# Patient Record
Sex: Male | Born: 1955 | Race: Black or African American | Hispanic: No | Marital: Single | Smoking: Never smoker
Health system: Southern US, Community
[De-identification: ages and names within clinical notes are randomized; demographics above are authoritative.]

## PROBLEM LIST (undated history)

## (undated) DIAGNOSIS — L8990 Pressure ulcer of unspecified site, unspecified stage: Secondary | ICD-10-CM

## (undated) DIAGNOSIS — M069 Rheumatoid arthritis, unspecified: Secondary | ICD-10-CM

## (undated) DIAGNOSIS — G8929 Other chronic pain: Secondary | ICD-10-CM

## (undated) DIAGNOSIS — J449 Chronic obstructive pulmonary disease, unspecified: Secondary | ICD-10-CM

## (undated) DIAGNOSIS — F209 Schizophrenia, unspecified: Secondary | ICD-10-CM

## (undated) DIAGNOSIS — I10 Essential (primary) hypertension: Secondary | ICD-10-CM

## (undated) DIAGNOSIS — I509 Heart failure, unspecified: Secondary | ICD-10-CM

## (undated) DIAGNOSIS — E079 Disorder of thyroid, unspecified: Secondary | ICD-10-CM

## (undated) DIAGNOSIS — N400 Enlarged prostate without lower urinary tract symptoms: Secondary | ICD-10-CM

---

## 2004-03-09 ENCOUNTER — Other Ambulatory Visit: Payer: Self-pay

## 2005-01-25 ENCOUNTER — Other Ambulatory Visit: Payer: Self-pay

## 2005-01-26 ENCOUNTER — Observation Stay: Payer: Self-pay | Admitting: Internal Medicine

## 2005-02-18 ENCOUNTER — Emergency Department: Payer: Self-pay | Admitting: Emergency Medicine

## 2005-02-28 ENCOUNTER — Emergency Department: Payer: Self-pay | Admitting: Emergency Medicine

## 2005-03-05 ENCOUNTER — Emergency Department: Payer: Self-pay | Admitting: Emergency Medicine

## 2005-07-06 ENCOUNTER — Other Ambulatory Visit: Payer: Self-pay

## 2005-07-06 ENCOUNTER — Emergency Department: Payer: Self-pay | Admitting: Emergency Medicine

## 2005-11-07 ENCOUNTER — Inpatient Hospital Stay: Payer: Self-pay | Admitting: Internal Medicine

## 2005-11-07 ENCOUNTER — Other Ambulatory Visit: Payer: Self-pay

## 2006-04-17 ENCOUNTER — Other Ambulatory Visit: Payer: Self-pay

## 2006-04-17 ENCOUNTER — Inpatient Hospital Stay: Payer: Self-pay | Admitting: Internal Medicine

## 2008-01-23 ENCOUNTER — Inpatient Hospital Stay: Payer: Self-pay | Admitting: Internal Medicine

## 2008-01-23 ENCOUNTER — Other Ambulatory Visit: Payer: Self-pay

## 2008-03-25 ENCOUNTER — Emergency Department: Payer: Self-pay | Admitting: Emergency Medicine

## 2008-05-15 ENCOUNTER — Inpatient Hospital Stay: Payer: Self-pay | Admitting: Unknown Physician Specialty

## 2008-05-15 ENCOUNTER — Other Ambulatory Visit: Payer: Self-pay

## 2008-08-18 ENCOUNTER — Emergency Department: Payer: Self-pay | Admitting: Emergency Medicine

## 2008-10-24 ENCOUNTER — Emergency Department: Payer: Self-pay | Admitting: Emergency Medicine

## 2008-11-06 ENCOUNTER — Emergency Department: Payer: Self-pay | Admitting: Emergency Medicine

## 2009-02-22 ENCOUNTER — Inpatient Hospital Stay: Payer: Self-pay | Admitting: Internal Medicine

## 2010-03-12 ENCOUNTER — Emergency Department: Payer: Self-pay | Admitting: Emergency Medicine

## 2010-05-13 ENCOUNTER — Emergency Department: Payer: Self-pay

## 2011-05-15 ENCOUNTER — Emergency Department: Payer: Self-pay | Admitting: Emergency Medicine

## 2011-07-17 ENCOUNTER — Emergency Department: Payer: Self-pay | Admitting: Emergency Medicine

## 2011-09-19 ENCOUNTER — Emergency Department: Payer: Self-pay | Admitting: Emergency Medicine

## 2011-11-04 ENCOUNTER — Emergency Department: Payer: Self-pay | Admitting: Emergency Medicine

## 2012-05-03 ENCOUNTER — Emergency Department: Payer: Self-pay | Admitting: Emergency Medicine

## 2012-05-03 LAB — CBC
HGB: 10.9 g/dL — ABNORMAL LOW (ref 13.0–18.0)
MCH: 26.4 pg (ref 26.0–34.0)
MCHC: 32.8 g/dL (ref 32.0–36.0)
Platelet: 228 10*3/uL (ref 150–440)
RBC: 4.15 10*6/uL — ABNORMAL LOW (ref 4.40–5.90)

## 2012-05-03 LAB — COMPREHENSIVE METABOLIC PANEL
Alkaline Phosphatase: 102 U/L (ref 50–136)
Anion Gap: 9 (ref 7–16)
BUN: 10 mg/dL (ref 7–18)
Chloride: 99 mmol/L (ref 98–107)
Co2: 29 mmol/L (ref 21–32)
Creatinine: 0.71 mg/dL (ref 0.60–1.30)
Osmolality: 272 (ref 275–301)
Sodium: 137 mmol/L (ref 136–145)

## 2012-07-25 ENCOUNTER — Emergency Department: Payer: Self-pay | Admitting: *Deleted

## 2012-07-25 LAB — COMPREHENSIVE METABOLIC PANEL
Albumin: 2.4 g/dL — ABNORMAL LOW (ref 3.4–5.0)
Anion Gap: 6 — ABNORMAL LOW (ref 7–16)
BUN: 8 mg/dL (ref 7–18)
Chloride: 107 mmol/L (ref 98–107)
Co2: 28 mmol/L (ref 21–32)
Creatinine: 0.8 mg/dL (ref 0.60–1.30)
Glucose: 88 mg/dL (ref 65–99)
Osmolality: 279 (ref 275–301)
Potassium: 3.7 mmol/L (ref 3.5–5.1)
Sodium: 141 mmol/L (ref 136–145)
Total Protein: 7.5 g/dL (ref 6.4–8.2)

## 2012-07-25 LAB — DRUG SCREEN, URINE
Amphetamines, Ur Screen: NEGATIVE (ref ?–1000)
Barbiturates, Ur Screen: POSITIVE (ref ?–200)
Cannabinoid 50 Ng, Ur ~~LOC~~: NEGATIVE (ref ?–50)
Cocaine Metabolite,Ur ~~LOC~~: NEGATIVE (ref ?–300)
Methadone, Ur Screen: NEGATIVE (ref ?–300)
Opiate, Ur Screen: NEGATIVE (ref ?–300)
Phencyclidine (PCP) Ur S: NEGATIVE (ref ?–25)
Tricyclic, Ur Screen: NEGATIVE (ref ?–1000)

## 2012-07-25 LAB — ETHANOL: Ethanol %: <0.003 % (ref 0.000–0.080)

## 2012-07-25 LAB — CBC
MCH: 26.6 pg (ref 26.0–34.0)
MCHC: 34 g/dL (ref 32.0–36.0)
MCV: 78 fL — ABNORMAL LOW (ref 80–100)
Platelet: 285 10*3/uL (ref 150–440)
RBC: 3.86 10*6/uL — ABNORMAL LOW (ref 4.40–5.90)
RDW: 16.4 % — ABNORMAL HIGH (ref 11.5–14.5)

## 2012-07-25 LAB — URINALYSIS, COMPLETE
Blood: NEGATIVE
Glucose,UR: NEGATIVE mg/dL (ref 0–75)
Ketone: NEGATIVE
Nitrite: NEGATIVE
Ph: 6 (ref 4.5–8.0)
Protein: 30
RBC,UR: 4 /HPF (ref 0–5)
Specific Gravity: 1.028 (ref 1.003–1.030)
WBC UR: 5 /HPF (ref 0–5)

## 2012-07-25 LAB — TSH: Thyroid Stimulating Horm: 2.86 u[IU]/mL

## 2012-10-11 ENCOUNTER — Emergency Department: Payer: Self-pay | Admitting: Emergency Medicine

## 2012-10-11 LAB — URINALYSIS, COMPLETE
Bacteria: NONE SEEN
Blood: NEGATIVE
Glucose,UR: NEGATIVE mg/dL (ref 0–75)
Leukocyte Esterase: NEGATIVE
Nitrite: NEGATIVE
Ph: 6 (ref 4.5–8.0)
Protein: NEGATIVE
RBC,UR: 2 /HPF (ref 0–5)
Specific Gravity: 1.025 (ref 1.003–1.030)

## 2012-10-11 LAB — CBC
HGB: 11.1 g/dL — ABNORMAL LOW (ref 13.0–18.0)
Platelet: 259 10*3/uL (ref 150–440)
RBC: 4 10*6/uL — ABNORMAL LOW (ref 4.40–5.90)
WBC: 4.8 10*3/uL (ref 3.8–10.6)

## 2012-10-11 LAB — DRUG SCREEN, URINE
Amphetamines, Ur Screen: NEGATIVE (ref ?–1000)
Methadone, Ur Screen: NEGATIVE (ref ?–300)
Opiate, Ur Screen: NEGATIVE (ref ?–300)
Phencyclidine (PCP) Ur S: NEGATIVE (ref ?–25)
Tricyclic, Ur Screen: NEGATIVE (ref ?–1000)

## 2012-10-11 LAB — COMPREHENSIVE METABOLIC PANEL
Alkaline Phosphatase: 97 U/L (ref 50–136)
Anion Gap: 6 — ABNORMAL LOW (ref 7–16)
BUN: 12 mg/dL (ref 7–18)
Co2: 30 mmol/L (ref 21–32)
Creatinine: 0.62 mg/dL (ref 0.60–1.30)
EGFR (Non-African Amer.): >60
Glucose: 95 mg/dL (ref 65–99)
Osmolality: 281 (ref 275–301)
SGOT(AST): 16 U/L (ref 15–37)
SGPT (ALT): 16 U/L (ref 12–78)

## 2012-10-11 LAB — ETHANOL: Ethanol %: <0.003 % (ref 0.000–0.080)

## 2012-10-11 LAB — TSH: Thyroid Stimulating Horm: 4.25 u[IU]/mL

## 2012-10-11 LAB — ACETAMINOPHEN LEVEL: Acetaminophen: <2 ug/mL

## 2012-10-11 LAB — SALICYLATE LEVEL: Salicylates, Serum: <1.7 mg/dL

## 2013-01-04 ENCOUNTER — Other Ambulatory Visit: Payer: Self-pay | Admitting: Family Medicine

## 2013-01-04 LAB — URINALYSIS, COMPLETE
Blood: NEGATIVE
Glucose,UR: NEGATIVE mg/dL (ref 0–75)
Leukocyte Esterase: NEGATIVE
Ph: 5 (ref 4.5–8.0)
RBC,UR: <1 /HPF (ref 0–5)
WBC UR: 1 /HPF (ref 0–5)

## 2013-01-04 LAB — LITHIUM LEVEL: Lithium: 0.65 mmol/L

## 2013-01-05 ENCOUNTER — Other Ambulatory Visit: Payer: Self-pay | Admitting: Family Medicine

## 2013-01-05 LAB — CBC WITH DIFFERENTIAL/PLATELET
Basophil #: 0 10*3/uL (ref 0.0–0.1)
Eosinophil %: 0.1 %
HGB: 11.7 g/dL — ABNORMAL LOW (ref 13.0–18.0)
Lymphocyte #: 1.3 10*3/uL (ref 1.0–3.6)
Lymphocyte %: 11.1 %
MCHC: 33.1 g/dL (ref 32.0–36.0)
MCV: 87 fL (ref 80–100)
Monocyte #: 2 x10 3/mm — ABNORMAL HIGH (ref 0.2–1.0)
Monocyte %: 17.8 %
Neutrophil #: 8.1 10*3/uL — ABNORMAL HIGH (ref 1.4–6.5)
Neutrophil %: 70.7 %
RBC: 4.05 10*6/uL — ABNORMAL LOW (ref 4.40–5.90)

## 2013-01-05 LAB — COMPREHENSIVE METABOLIC PANEL
Albumin: 2.9 g/dL — ABNORMAL LOW (ref 3.4–5.0)
BUN: 15 mg/dL (ref 7–18)
Calcium, Total: 8.5 mg/dL (ref 8.5–10.1)
Chloride: 105 mmol/L (ref 98–107)
Creatinine: 0.75 mg/dL (ref 0.60–1.30)
Potassium: 3.8 mmol/L (ref 3.5–5.1)
SGOT(AST): 20 U/L (ref 15–37)
SGPT (ALT): 9 U/L — ABNORMAL LOW (ref 12–78)

## 2013-01-06 LAB — URINE CULTURE

## 2013-01-19 ENCOUNTER — Other Ambulatory Visit: Payer: Self-pay | Admitting: Family Medicine

## 2013-01-19 LAB — URINALYSIS, COMPLETE
Bacteria: NONE SEEN
Bilirubin,UR: NEGATIVE
Blood: NEGATIVE
Hyaline Cast: 4
Ketone: NEGATIVE
Leukocyte Esterase: NEGATIVE
RBC,UR: <1 /HPF (ref 0–5)
Specific Gravity: 1.008 (ref 1.003–1.030)
Squamous Epithelial: 6
WBC UR: 2 /HPF (ref 0–5)

## 2013-01-19 LAB — CBC WITH DIFFERENTIAL/PLATELET
Basophil #: 0.1 10*3/uL (ref 0.0–0.1)
Basophil %: 0.8 %
Eosinophil #: 0.2 10*3/uL (ref 0.0–0.7)
Eosinophil %: 3.2 %
HCT: 34 % — ABNORMAL LOW (ref 40.0–52.0)
HGB: 11.2 g/dL — ABNORMAL LOW (ref 13.0–18.0)
MCHC: 32.9 g/dL (ref 32.0–36.0)
MCV: 88 fL (ref 80–100)
Monocyte %: 8 %
Neutrophil %: 69.1 %
Platelet: 334 10*3/uL (ref 150–440)
RBC: 3.87 10*6/uL — ABNORMAL LOW (ref 4.40–5.90)
WBC: 6.7 10*3/uL (ref 3.8–10.6)

## 2013-01-21 LAB — URINE CULTURE

## 2015-03-16 NOTE — Consult Note (Signed)
PATIENT NAME:  Jerry [name], Jerry [name] MR#:  [id number] DATE OF BIRTH:  1956-08-01  DATE OF CONSULTATION:  07/26/2012  REFERRING PHYSICIAN:   CONSULTING PHYSICIAN:  [name], MD  IDENTIFYING INFORMATION AND REASON FOR CONSULTATION: This is a 59 year old gentleman who came to the Emergency Room by EMS with a complaint of having pain in his legs. Consult for evaluation of a psychiatric patient.   HISTORY OF PRESENT ILLNESS: Information obtained from the patient, from his current chart and from his old chart. The chart indicates that he came into the Emergency Room last night with a chief complaint of having pain in his legs. He was evaluated by psychiatric nurse and was denying suicidal or homicidal ideation. He was endorsing having some hallucinations and having been off of his medicines but his primary complaints were medical. I saw the patient today and his chief complaint to me was pain in his legs. He told me that for the last several days he has felt that someone is pulling on his leg. He cannot see them. He denies having any auditory or visual hallucinations. He says that he has been off of all of his medicines for about 5 or 6 days and wants to get back on them. His reason for being off his medicines is that his pharmacy has closed which I do not think is actually correct. He cannot give a logical reason why his medicines have not been refilled or his doctor has not been contacted. Patient is not complaining of acute mood symptoms and is not behaving in an agitated manner. He is not refusing treatment and actually gives evidence of having good insight into his current condition and needs.   PAST PSYCHIATRIC HISTORY: Patient has history of schizophrenia or schizoaffective disorder. He was last admitted to our hospital in 2009 under Dr. Reuel Derby service and was treated with antipsychotics and mood stabilizers and stabilized well. Since then he has had one visit to our Emergency Room and was not admitted to  the hospital. Patient tells me that he had recently been following up with a doctor but for some reason that he is not sure of has not been able to go back to see his doctor. He tells me that the people at the place where he lives had taken him to see Dr. [name]. It is not clear why Dr. [name] is not continuing to prescribe this medicine. Patient denies any past history of suicide attempts. We know that in the past he had been followed at [facility] by Dr. [name] and had been stabilized on Depakote and Risperdal in the past.   PAST MEDICAL HISTORY: Patient has multiple medical problems including history of two knee replacements with chronic pain and disability in his legs. Appears to be chronically in need of either a wheelchair or crutches. Has a history of cardiac catheterization, history of prostatic hypertrophy, history of gastric reflux, history of congestive heart failure and hypertension. Chronic obstructive pulmonary disease. Patient has a list of all of his medications on hand which he shows me. He knows the names of all of the medicines. He says he has been off of them for about five days.   SOCIAL HISTORY: Patient lives with a woman named [name]. He describes her as being a "Christian sister". He appears to not have family currently actively involved in his treatment. He says that [name] is his caregiver. He also, however, states that she has not been taking him to any of his scheduled doctor's  appointments or getting any of his medication filled.   CURRENT MEDICATIONS:  1. Coreg 6.25 mg twice a day.  2. Proscar 5 mg per day.  3. Advair Diskus 250/50, 1 puff twice a day.  4. Meloxicam 7.5 mg per day.  5. Olanzapine 10 mg at bedtime.  6. Potassium chloride 10 mEq per day. 7. Flomax 0.4 mg once a day. 8. Demadex 20 mg per day.   ALLERGIES: Methadone, penicillin, propoxyphene, sulfa drugs.   SUBSTANCE ABUSE HISTORY: Patient denies use of alcohol or drugs. It does not appear that this  has been an active part of his problems in the relevant past.   REVIEW OF SYSTEMS: He is complaining of pain in his feet. Complains of feeling like there are people who are pulling on at bedtime feet. Denies hallucinations, otherwise. Denies suicidal or homicidal ideation. Denies any other specific medical issues.   MENTAL STATUS EXAM: Somewhat disheveled, poorly groomed man who looks his stated age or older. Interviewed in the Emergency Room. He is cooperative and pleasant during the interview. Makes good eye contact. Psychomotor activity is a little bit slow. He does not seem to be able to move his legs very well. His speech was quiet but otherwise normal in tone and volume. His affect was a little bit flattened but not bizarrely so. No sign of hostility. No tearfulness. Appropriate reactivity. Thoughts appeared to be grossly lucid and he was able to hold a reasonable conversation. He apparently has tactile hallucinations and at other times has made some hyperreligious comments but does not seem to be driven by obvious delusions. He denies suicidal or homicidal ideation. He is alert and oriented x4. Judgment and insight seem adequate to the current situation. Intelligence is probably average. Short-term and longer term memory grossly intact.  LABORATORY, DIAGNOSTIC AND RADIOLOGICAL DATA: Drug screen positive for barbiturates. TSH normal. Alcohol undetectable. Calcium low at 8.2, albumin low at 2.4. CBC shows a low hematocrit at 30.3, low MCV possible iron deficiency. Urinalysis borderline for possible infection.   ASSESSMENT: This is a 59 year old man who has schizophrenia but is actually presenting to the hospital with a primary complaint of pain in his legs. On the interview I obtained today he was pretty clear about that. Psychiatric issues were not part of his chief complaint. He has been off his medicine for several days for unclear reasons. He claims that the caregiver he stays with has not been  taking him to his medical appointments. We have been trying to get in touch with his caregiver since last night and so far have not found a phone number to reach her. Patient says he does not know of any phone number to directly reach her. Patient does not need psychiatric admission. He would benefit from being back on his medication and having an outpatient position both for his medical and psychiatric problems. It appears right now that he needs to have some social intervention to make sure that he is being safely taken care of in the community.   TREATMENT PLAN: I have put in orders for his current medications as he lists him. Again, he does not need psychiatric hospitalization. If an involuntary commitment paper has been filed I will discontinue it. Patient's case will be presented to the Emergency Room attending. Our staff have already talked to care management about looking into placement.   DIAGNOSIS PRINCIPLE AND PRIMARY:  AXIS I: Schizophrenia, undifferentiated.   SECONDARY DIAGNOSES:  AXIS I: No further.   AXIS II:  No diagnosis.   AXIS III:  1. Hypertension. 2. Chronic pain. 3. Chronic obstructive pulmonary disease.  4. History of prostate enlargement. 5. History of heart failure. 6. Inability to use legs of unclear etiology unless it is just from the chronic pain from his knee replacements.   AXIS IV: Severe from what appears to be poor care and use of resources.   AXIS V: Functioning at time of assessment is 45.   ____________________________ [name], MD jtc:cms D: 07/26/2012 11:57:49 ET T: 07/26/2012 12:25:18 ET JOB#: [id number]  cc: [name], MD, <Dictator> [name] MD ELECTRONICALLY SIGNED 07/26/2012 14:30

## 2015-03-16 NOTE — Consult Note (Signed)
Brief Consult Note: Diagnosis: schizophrenia.   Patient was seen by consultant.   Consult note dictated.   Orders entered.   Comments: Psychiatry: Patient seen. Chart reviewed. Patient has schizophrenia but also multiple other problems. He has tactile  hallucinations but is not agitated, denies AH and denies any suicidal or homicidal ideation. He came to the hospital wanting to get his meds filled. I reordered his meds per his list. He does not need psychiatric admission. Needs his placement and care to me looked into.  Electronic Signatures: [name] (MD)  (Signed 30-Aug-13 11:46)  Authored: Brief Consult Note   Last Updated: 30-Aug-13 11:46 by [name] (MD)

## 2015-03-16 NOTE — Consult Note (Signed)
Brief Consult Note: Diagnosis: schizophrenia.   Patient was seen by consultant.   Consult note dictated.   Comments: Psychiatry: Patient seen at request of Dr Jimmye Norman for questions of competancy. PAtient known to me from prior evaluation last week. Full note done but the main point is that I do not think he is really capable of making a rational decision about his place to live or his rehab treatment.  Electronic Signatures: [name] (MD)  (Signed 05-Sep-13 14:11)  Authored: Brief Consult Note   Last Updated: 05-Sep-13 14:11 by [name] (MD)

## 2015-03-16 NOTE — Consult Note (Signed)
PATIENT NAME:  Jerry [name], Jerry [name] MR#:  [id number] DATE OF BIRTH:  11/06/1956  DATE OF CONSULTATION:  08/01/2012  CONSULTING PHYSICIAN:  [name], MD  IDENTIFYING INFORMATION:  The patient is a 59 year old man with schizophrenia who has been in the Emergency Room for about a week.   REASON FOR CONSULTATION: Consult is for a question of his ability to make competent decisions.   HISTORY OF PRESENT ILLNESS: Information is obtained from the patient and from the chart. The patient is known to me from evaluation I did when he first came into the Emergency Room last week. He is a 59 year old man with a history of schizophrenia who had been residing at what appears to probably be an unlicensed group home in the community. He called 911 to have himself brought to the Emergency Room because of complaints about feeling that "someone is holding my feet down." He did not have suicidal or homicidal ideation. He was not agitated or aggressive. He has been basically cooperative with treatment in the Emergency Room so far. It was my judgment at the time that the patient did not have a need for admission to a Psychiatry Ward. I had recommended that he be discharged home or otherwise discharged from the Emergency Room. It appears that in the interval the patient has been evaluated by Physical Therapy who have concluded that the patient needs a large amount of assistance in transfer and with physical therapy assistance to try and regain any mobility. It also appears that he has been diagnosed with pressure sores which require treatment and monitoring. A recommendation has been made that he go to a rehab facility for further treatment. The patient evidently has been declining that recommendation. When I saw him today, the patient did not complain of any mood symptoms. He did not complain of any active psychotic symptoms. He denied any suicidal or homicidal ideation. He says that generally he was feeling okay. He confirmed that  he was not able to stand up or walk on his own and required assistance to transfer. He told me that so far what he knew is that somebody had "said something about [facility] or something," and all he wanted to do was to go back home. He told me that he did not want to go to a rehab facility. His initial reasoning for this was that he said that he has been in them before and that he was somehow cheated out of money. He then went on to go on a tangential description of reasons why he wants to go back to where he was living before.   PAST PSYCHIATRIC HISTORY: Long history of schizophrenia. He has recently been maintained on olanzapine. He appears to still have some disorganized thinking but not actively responding to internal stimuli. Mood is stable. No acute dangerous behavior. He had questionable compliance with medication or treatment in the community, and its unclear whether that was because of the people at the group home or because of himself and his behavior.   PAST MEDICAL HISTORY: The patient has hypertension, prostate hypertrophy, chronic pain, inability to walk which I do not think is completely explained. 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   SOCIAL HISTORY: The patient apparently had been living in a facility here in [place]. According to the notes from Care Management, the person he was living with is known to have in the past operated unlicensed group homes. I do not see clear documentation that there has been communication with that person. The  patient was not able to really tell me very clearly. It sounds like they probably have not been able to visit him, and he has not spoken to anyone on the phone.   REVIEW OF SYSTEMS: He denies mood symptoms. Denies hallucinations. Denies acute anxiety. Denies major symptoms of pain. Says that he is eating okay. Denies suicidal or homicidal ideation.   MENTAL STATUS EXAM: A 60 year old man, moderate-to-severely overweight. Dressed in a hospital gown. Sitting  up in a chair. Cooperative with the interview. Made good eye contact. Psychomotor activity was appropriate. Speech was normal in tone. Affect was a little bit constricted but not necessarily bizarrely so. Mood was stated as being okay. The patient's thoughts were markedly tangential. He required constant redirection to keep him on the topic of discussing his medical care. His tangents tend to be either about money that he believes he is owed, money that he thinks that he is going to receive from a lawsuit, or some sort of plan he has to rescue his niece from being in a homeless shelter. The patient seems to be confused and borderline delusional about some paperwork that he has at home. He told me that he has paperwork at home that if he can get it organized and put it on a computer it will save his niece. When asked to describe this, he was extremely vague but it sounds like it might have been some junk mail advertisements that he had. He did not appear to be responding to internal stimuli. He denies suicidal or homicidal ideation. On cognitive exam, he is alert and oriented to the place and date and situation. His immediate short-term memory is grossly intact. He appears to probably be of normal baseline intelligence. Unfortunately, despite multiple efforts of mine to keep him on topic, he is very tangential and the things that he gets tangential about do not make any sense. I explained to the patient his medical condition and the rationale for having him go to a rehab facility. I emphasized to him that pressure sores are potentially fatal and that our evidence suggests that he has not been getting very good care recently. The patient was able to repeat to me this basic argument but then went on to immediately say that he needs to go back home so that he can save his niece from being homeless. This whole story makes very little sense, but he is completely fixated on it. He will not admit that getting his own health  taken care of first could be more important than that. He also is very confused about his money. He is unable to explain to me exactly how his money is dispersed, and he has unrealistic ideas about the financing of going to a rehab facility.   ASSESSMENT: This 59 year old man with schizophrenia has been recommended to be in a rehab facility such as [facility] or [facility]. The rationale has been explained to him, namely to get skilled nursing assistance, physical therapy, and treatment for pressure sores. The patient is able to repeat this recommendation, but his thoughts are not able to rationally follow the argument. He has what appears to be a delusion about some kind of help that he is supposed to render to his niece. None of it makes any sense, and he cannot really explain it very well. He is not able to see that this is nonsensical and delusional. Because of this, I do not think he can make a rational judgment about what is  in his own best interest. I would also add that at this point it seems unlikely that there is much chance that that is going to change in the immediate future.   TREATMENT RECOMMENDATIONS: On the specific question of whether he is able to make an informed decision about his own placement, I would say that currently my examination is that he is not able to make such a rational decision. I recommend that Care Management and the Emergency Room follow through with treatment for him on that basis.   DIAGNOSIS, PRINCIPAL AND PRIMARY:  AXIS I: Schizophrenia, undifferentiated.   AXIS II: Deferred.   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 2. Hypertension. 3. Pressure sores.   AXIS IV: Severe from being in the Emergency Room for so long, having no clear place to live.   AXIS V: Functioning at time of evaluation 30.  ____________________________ [name], MD jtc:cbb D: 08/01/2012 14:23:09 ET T: 08/01/2012 18:04:01 ET JOB#: [id number] [name] MD ELECTRONICALLY SIGNED 08/01/2012 23:08

## 2017-06-21 ENCOUNTER — Encounter: Payer: Self-pay | Admitting: Emergency Medicine

## 2017-06-21 ENCOUNTER — Emergency Department: Payer: Medicaid Other

## 2017-06-21 ENCOUNTER — Inpatient Hospital Stay
Admission: EM | Admit: 2017-06-21 | Discharge: 2017-07-02 | DRG: 870 | Disposition: A | Discharge status: Skilled Nursing Facility | Payer: Medicaid Other | Attending: Internal Medicine | Admitting: Internal Medicine

## 2017-06-21 DIAGNOSIS — M069 Rheumatoid arthritis, unspecified: Secondary | ICD-10-CM | POA: Diagnosis present

## 2017-06-21 DIAGNOSIS — G40909 Epilepsy, unspecified, not intractable, without status epilepticus: Secondary | ICD-10-CM | POA: Diagnosis present

## 2017-06-21 DIAGNOSIS — R6520 Severe sepsis without septic shock: Secondary | ICD-10-CM | POA: Diagnosis not present

## 2017-06-21 DIAGNOSIS — Z6841 Body Mass Index (BMI) 40.0 and over, adult: Secondary | ICD-10-CM | POA: Diagnosis not present

## 2017-06-21 DIAGNOSIS — I5030 Unspecified diastolic (congestive) heart failure: Secondary | ICD-10-CM | POA: Diagnosis present

## 2017-06-21 DIAGNOSIS — Z9911 Dependence on respirator [ventilator] status: Secondary | ICD-10-CM | POA: Diagnosis not present

## 2017-06-21 DIAGNOSIS — R001 Bradycardia, unspecified: Secondary | ICD-10-CM

## 2017-06-21 DIAGNOSIS — R400 Somnolence: Secondary | ICD-10-CM | POA: Diagnosis not present

## 2017-06-21 DIAGNOSIS — R680 Hypothermia, not associated with low environmental temperature: Secondary | ICD-10-CM | POA: Diagnosis not present

## 2017-06-21 DIAGNOSIS — R509 Fever, unspecified: Secondary | ICD-10-CM | POA: Diagnosis not present

## 2017-06-21 DIAGNOSIS — N170 Acute kidney failure with tubular necrosis: Secondary | ICD-10-CM | POA: Diagnosis present

## 2017-06-21 DIAGNOSIS — I469 Cardiac arrest, cause unspecified: Secondary | ICD-10-CM | POA: Diagnosis not present

## 2017-06-21 DIAGNOSIS — J9602 Acute respiratory failure with hypercapnia: Secondary | ICD-10-CM

## 2017-06-21 DIAGNOSIS — J9621 Acute and chronic respiratory failure with hypoxia: Secondary | ICD-10-CM | POA: Diagnosis present

## 2017-06-21 DIAGNOSIS — L89311 Pressure ulcer of right buttock, stage 1: Secondary | ICD-10-CM | POA: Diagnosis present

## 2017-06-21 DIAGNOSIS — I110 Hypertensive heart disease with heart failure: Secondary | ICD-10-CM | POA: Diagnosis present

## 2017-06-21 DIAGNOSIS — Z4659 Encounter for fitting and adjustment of other gastrointestinal appliance and device: Secondary | ICD-10-CM

## 2017-06-21 DIAGNOSIS — J9600 Acute respiratory failure, unspecified whether with hypoxia or hypercapnia: Secondary | ICD-10-CM

## 2017-06-21 DIAGNOSIS — J9622 Acute and chronic respiratory failure with hypercapnia: Secondary | ICD-10-CM | POA: Diagnosis present

## 2017-06-21 DIAGNOSIS — A419 Sepsis, unspecified organism: Principal | ICD-10-CM

## 2017-06-21 DIAGNOSIS — R1310 Dysphagia, unspecified: Secondary | ICD-10-CM | POA: Diagnosis not present

## 2017-06-21 DIAGNOSIS — Z79899 Other long term (current) drug therapy: Secondary | ICD-10-CM

## 2017-06-21 DIAGNOSIS — D6959 Other secondary thrombocytopenia: Secondary | ICD-10-CM | POA: Diagnosis not present

## 2017-06-21 DIAGNOSIS — N400 Enlarged prostate without lower urinary tract symptoms: Secondary | ICD-10-CM | POA: Diagnosis present

## 2017-06-21 DIAGNOSIS — J440 Chronic obstructive pulmonary disease with acute lower respiratory infection: Secondary | ICD-10-CM | POA: Diagnosis present

## 2017-06-21 DIAGNOSIS — D72819 Decreased white blood cell count, unspecified: Secondary | ICD-10-CM | POA: Diagnosis not present

## 2017-06-21 DIAGNOSIS — G9341 Metabolic encephalopathy: Secondary | ICD-10-CM | POA: Diagnosis present

## 2017-06-21 DIAGNOSIS — T68XXXA Hypothermia, initial encounter: Secondary | ICD-10-CM

## 2017-06-21 DIAGNOSIS — Z7401 Bed confinement status: Secondary | ICD-10-CM | POA: Diagnosis not present

## 2017-06-21 DIAGNOSIS — R609 Edema, unspecified: Secondary | ICD-10-CM

## 2017-06-21 DIAGNOSIS — Z452 Encounter for adjustment and management of vascular access device: Secondary | ICD-10-CM

## 2017-06-21 DIAGNOSIS — R918 Other nonspecific abnormal finding of lung field: Secondary | ICD-10-CM | POA: Diagnosis not present

## 2017-06-21 DIAGNOSIS — R5383 Other fatigue: Secondary | ICD-10-CM | POA: Diagnosis not present

## 2017-06-21 DIAGNOSIS — I10 Essential (primary) hypertension: Secondary | ICD-10-CM

## 2017-06-21 DIAGNOSIS — Z885 Allergy status to narcotic agent status: Secondary | ICD-10-CM | POA: Diagnosis not present

## 2017-06-21 DIAGNOSIS — Y95 Nosocomial condition: Secondary | ICD-10-CM | POA: Diagnosis present

## 2017-06-21 DIAGNOSIS — E039 Hypothyroidism, unspecified: Secondary | ICD-10-CM | POA: Diagnosis present

## 2017-06-21 DIAGNOSIS — L8990 Pressure ulcer of unspecified site, unspecified stage: Secondary | ICD-10-CM | POA: Insufficient documentation

## 2017-06-21 DIAGNOSIS — J9601 Acute respiratory failure with hypoxia: Secondary | ICD-10-CM

## 2017-06-21 DIAGNOSIS — G9340 Encephalopathy, unspecified: Secondary | ICD-10-CM | POA: Diagnosis not present

## 2017-06-21 DIAGNOSIS — E873 Alkalosis: Secondary | ICD-10-CM | POA: Diagnosis present

## 2017-06-21 DIAGNOSIS — Z880 Allergy status to penicillin: Secondary | ICD-10-CM | POA: Diagnosis not present

## 2017-06-21 DIAGNOSIS — G894 Chronic pain syndrome: Secondary | ICD-10-CM | POA: Diagnosis present

## 2017-06-21 DIAGNOSIS — J189 Pneumonia, unspecified organism: Secondary | ICD-10-CM

## 2017-06-21 DIAGNOSIS — Z792 Long term (current) use of antibiotics: Secondary | ICD-10-CM | POA: Diagnosis not present

## 2017-06-21 DIAGNOSIS — R6521 Severe sepsis with septic shock: Secondary | ICD-10-CM | POA: Diagnosis not present

## 2017-06-21 DIAGNOSIS — E870 Hyperosmolality and hypernatremia: Secondary | ICD-10-CM | POA: Diagnosis present

## 2017-06-21 DIAGNOSIS — D696 Thrombocytopenia, unspecified: Secondary | ICD-10-CM

## 2017-06-21 DIAGNOSIS — Z978 Presence of other specified devices: Secondary | ICD-10-CM | POA: Diagnosis not present

## 2017-06-21 DIAGNOSIS — E662 Morbid (severe) obesity with alveolar hypoventilation: Secondary | ICD-10-CM | POA: Diagnosis present

## 2017-06-21 DIAGNOSIS — I4891 Unspecified atrial fibrillation: Secondary | ICD-10-CM | POA: Diagnosis present

## 2017-06-21 DIAGNOSIS — N390 Urinary tract infection, site not specified: Secondary | ICD-10-CM

## 2017-06-21 DIAGNOSIS — H1130 Conjunctival hemorrhage, unspecified eye: Secondary | ICD-10-CM | POA: Diagnosis present

## 2017-06-21 DIAGNOSIS — K5900 Constipation, unspecified: Secondary | ICD-10-CM

## 2017-06-21 DIAGNOSIS — F209 Schizophrenia, unspecified: Secondary | ICD-10-CM | POA: Diagnosis present

## 2017-06-21 DIAGNOSIS — R0600 Dyspnea, unspecified: Secondary | ICD-10-CM

## 2017-06-21 DIAGNOSIS — I509 Heart failure, unspecified: Secondary | ICD-10-CM

## 2017-06-21 DIAGNOSIS — R4182 Altered mental status, unspecified: Secondary | ICD-10-CM

## 2017-06-21 DIAGNOSIS — R5381 Other malaise: Secondary | ICD-10-CM | POA: Diagnosis not present

## 2017-06-21 DIAGNOSIS — L89321 Pressure ulcer of left buttock, stage 1: Secondary | ICD-10-CM | POA: Diagnosis present

## 2017-06-21 DIAGNOSIS — Z87440 Personal history of urinary (tract) infections: Secondary | ICD-10-CM

## 2017-06-21 DIAGNOSIS — L89151 Pressure ulcer of sacral region, stage 1: Secondary | ICD-10-CM | POA: Diagnosis present

## 2017-06-21 DIAGNOSIS — J9690 Respiratory failure, unspecified, unspecified whether with hypoxia or hypercapnia: Secondary | ICD-10-CM

## 2017-06-21 DIAGNOSIS — Z882 Allergy status to sulfonamides status: Secondary | ICD-10-CM

## 2017-06-21 HISTORY — DX: Other chronic pain: G89.29

## 2017-06-21 HISTORY — DX: Pressure ulcer of unspecified site, unspecified stage: L89.90

## 2017-06-21 HISTORY — DX: Rheumatoid arthritis, unspecified: M06.9

## 2017-06-21 HISTORY — DX: Chronic obstructive pulmonary disease, unspecified: J44.9

## 2017-06-21 HISTORY — DX: Benign prostatic hyperplasia without lower urinary tract symptoms: N40.0

## 2017-06-21 HISTORY — DX: Disorder of thyroid, unspecified: E07.9

## 2017-06-21 HISTORY — DX: Essential (primary) hypertension: I10

## 2017-06-21 HISTORY — DX: Heart failure, unspecified: I50.9

## 2017-06-21 HISTORY — DX: Schizophrenia, unspecified: F20.9

## 2017-06-21 HISTORY — DX: Morbid (severe) obesity due to excess calories: E66.01

## 2017-06-21 LAB — CBC WITH DIFFERENTIAL/PLATELET
BASOS ABS: 0 10*3/uL (ref 0–0.1)
BASOS PCT: 0 %
Basophils Absolute: 0 10*3/uL (ref 0–0.1)
Basophils Relative: 1 %
EOS ABS: 0 10*3/uL (ref 0–0.7)
EOS ABS: 0 10*3/uL (ref 0–0.7)
Eosinophils Relative: 2 %
Eosinophils Relative: 1 %
HCT: 36.7 % — ABNORMAL LOW (ref 40.0–52.0)
HEMATOCRIT: 33.1 % — AB (ref 40.0–52.0)
HEMOGLOBIN: 11.1 g/dL — AB (ref 13.0–18.0)
Hemoglobin: 12.3 g/dL — ABNORMAL LOW (ref 13.0–18.0)
LYMPHS ABS: 0.6 10*3/uL — AB (ref 1.0–3.6)
LYMPHS PCT: 19 %
Lymphocytes Relative: 21 %
Lymphs Abs: 0.6 10*3/uL — ABNORMAL LOW (ref 1.0–3.6)
MCH: 30 pg (ref 26.0–34.0)
MCH: 29.7 pg (ref 26.0–34.0)
MCHC: 33.6 g/dL (ref 32.0–36.0)
MCHC: 33.5 g/dL (ref 32.0–36.0)
MCV: 89.3 fL (ref 80.0–100.0)
MCV: 88.8 fL (ref 80.0–100.0)
MONO ABS: 0.2 10*3/uL (ref 0.2–1.0)
Monocytes Absolute: 0.2 10*3/uL (ref 0.2–1.0)
Monocytes Relative: 6 %
Monocytes Relative: 7 %
NEUTROS ABS: 2.1 10*3/uL (ref 1.4–6.5)
NEUTROS PCT: 72 %
Neutro Abs: 2 10*3/uL (ref 1.4–6.5)
Neutrophils Relative %: 73 %
PLATELETS: 22 10*3/uL — AB (ref 150–440)
Platelets: 19 10*3/uL — CL (ref 150–440)
RBC: 4.11 MIL/uL — AB (ref 4.40–5.90)
RBC: 3.73 MIL/uL — ABNORMAL LOW (ref 4.40–5.90)
RDW: 17.3 % — ABNORMAL HIGH (ref 11.5–14.5)
RDW: 16.8 % — AB (ref 11.5–14.5)
WBC: 2.8 10*3/uL — AB (ref 3.8–10.6)
WBC: 2.9 10*3/uL — ABNORMAL LOW (ref 3.8–10.6)

## 2017-06-21 LAB — BLOOD GAS, ARTERIAL
ACID-BASE EXCESS: 10.6 mmol/L — AB (ref 0.0–2.0)
BICARBONATE: 35.7 mmol/L — AB (ref 20.0–28.0)
FIO2: 50
LHR: 18 {breaths}/min
O2 SAT: 95.8 %
PATIENT TEMPERATURE: 37
PCO2 ART: 49 mmHg — AB (ref 32.0–48.0)
PEEP/CPAP: 5 cmH2O
PO2 ART: 75 mmHg — AB (ref 83.0–108.0)
VT: 500 mL
pH, Arterial: 7.47 — ABNORMAL HIGH (ref 7.350–7.450)

## 2017-06-21 LAB — VALPROIC ACID LEVEL: Valproic Acid Lvl: 39 ug/mL — ABNORMAL LOW (ref 50.0–100.0)

## 2017-06-21 LAB — URINE DRUG SCREEN, QUALITATIVE (ARMC ONLY)
Amphetamines, Ur Screen: NOT DETECTED
Barbiturates, Ur Screen: NOT DETECTED
Benzodiazepine, Ur Scrn: NOT DETECTED
Cannabinoid 50 Ng, Ur ~~LOC~~: NOT DETECTED
Cocaine Metabolite,Ur ~~LOC~~: NOT DETECTED
MDMA (Ecstasy)Ur Screen: NOT DETECTED
Methadone Scn, Ur: NOT DETECTED
Opiate, Ur Screen: NOT DETECTED
Phencyclidine (PCP) Ur S: NOT DETECTED
Tricyclic, Ur Screen: POSITIVE — AB

## 2017-06-21 LAB — LACTIC ACID, PLASMA: Lactic Acid, Venous: 1.5 mmol/L (ref 0.5–1.9)

## 2017-06-21 LAB — URINALYSIS, COMPLETE (UACMP) WITH MICROSCOPIC
Bacteria, UA: NONE SEEN
Bilirubin Urine: NEGATIVE
Glucose, UA: NEGATIVE mg/dL
Ketones, ur: 5 mg/dL — AB
NITRITE: NEGATIVE
PH: 5 (ref 5.0–8.0)
Protein, ur: 30 mg/dL — AB
SPECIFIC GRAVITY, URINE: 1.025 (ref 1.005–1.030)

## 2017-06-21 LAB — COMPREHENSIVE METABOLIC PANEL
ALK PHOS: 92 U/L (ref 38–126)
ALT: 39 U/L (ref 17–63)
ANION GAP: 7 (ref 5–15)
AST: 38 U/L (ref 15–41)
Albumin: 3 g/dL — ABNORMAL LOW (ref 3.5–5.0)
BUN: 16 mg/dL (ref 6–20)
CALCIUM: 9 mg/dL (ref 8.9–10.3)
CHLORIDE: 102 mmol/L (ref 101–111)
CO2: 35 mmol/L — AB (ref 22–32)
Creatinine, Ser: 0.53 mg/dL — ABNORMAL LOW (ref 0.61–1.24)
GFR calc non Af Amer: >60 mL/min (ref >60)
Glucose, Bld: 119 mg/dL — ABNORMAL HIGH (ref 65–99)
Potassium: 3.9 mmol/L (ref 3.5–5.1)
SODIUM: 144 mmol/L (ref 135–145)
Total Bilirubin: 0.5 mg/dL (ref 0.3–1.2)
Total Protein: 7.1 g/dL (ref 6.5–8.1)

## 2017-06-21 LAB — GLUCOSE, CAPILLARY
GLUCOSE-CAPILLARY: 113 mg/dL — AB (ref 65–99)
GLUCOSE-CAPILLARY: 142 mg/dL — AB (ref 65–99)

## 2017-06-21 LAB — ETHANOL

## 2017-06-21 LAB — PROTIME-INR
INR: 1.1
PROTHROMBIN TIME: 14.2 s (ref 11.4–15.2)

## 2017-06-21 LAB — TROPONIN I
Troponin I: <0.03 ng/mL (ref <0.03)
Troponin I: <0.03 ng/mL (ref <0.03)

## 2017-06-21 LAB — MRSA PCR SCREENING: MRSA by PCR: POSITIVE — AB

## 2017-06-21 LAB — SALICYLATE LEVEL

## 2017-06-21 LAB — PROCALCITONIN

## 2017-06-21 LAB — ACETAMINOPHEN LEVEL

## 2017-06-21 LAB — TSH: TSH: 5.336 u[IU]/mL — AB (ref 0.350–4.500)

## 2017-06-21 MED ORDER — SODIUM CHLORIDE 0.9 % IV BOLUS (SEPSIS)
500.0000 mL | Freq: Once | INTRAVENOUS | Status: AC
  Administered 2017-06-21: 500 mL via INTRAVENOUS

## 2017-06-21 MED ORDER — BISACODYL 10 MG RE SUPP
10.0000 mg | Freq: Every day | RECTAL | Status: DC | PRN
  Administered 2017-06-26: 10 mg via RECTAL
  Filled 2017-06-21: qty 1

## 2017-06-21 MED ORDER — FLUPHENAZINE HCL 5 MG PO TABS
12.5000 mg | ORAL_TABLET | Freq: Every day | ORAL | Status: DC
  Administered 2017-06-22 – 2017-06-24 (×3): 12.5 mg via ORAL
  Filled 2017-06-21 (×5): qty 3

## 2017-06-21 MED ORDER — SODIUM CHLORIDE 0.9 % IV BOLUS (SEPSIS)
1000.0000 mL | Freq: Once | INTRAVENOUS | Status: AC
  Administered 2017-06-21: 1000 mL via INTRAVENOUS

## 2017-06-21 MED ORDER — DOCUSATE SODIUM 50 MG/5ML PO LIQD
100.0000 mg | Freq: Two times a day (BID) | ORAL | Status: DC | PRN
  Administered 2017-06-25: 100 mg
  Filled 2017-06-21: qty 10

## 2017-06-21 MED ORDER — DEXTROSE 5 % IV SOLN
500.0000 mg | INTRAVENOUS | Status: DC
  Administered 2017-06-22 – 2017-06-24 (×3): 500 mg via INTRAVENOUS
  Filled 2017-06-21 (×4): qty 500

## 2017-06-21 MED ORDER — LEVOTHYROXINE SODIUM 100 MCG IV SOLR
75.0000 ug | Freq: Every day | INTRAVENOUS | Status: DC
  Administered 2017-06-22 – 2017-06-24 (×3): 75 ug via INTRAVENOUS
  Filled 2017-06-21 (×4): qty 5

## 2017-06-21 MED ORDER — CHLORHEXIDINE GLUCONATE 0.12% ORAL RINSE (MEDLINE KIT)
15.0000 mL | Freq: Two times a day (BID) | OROMUCOSAL | Status: DC
  Administered 2017-06-21 – 2017-06-27 (×13): 15 mL via OROMUCOSAL

## 2017-06-21 MED ORDER — NALOXONE HCL 2 MG/2ML IJ SOSY
PREFILLED_SYRINGE | INTRAMUSCULAR | Status: AC | PRN
  Administered 2017-06-21 (×2): 2 mg via INTRAVENOUS

## 2017-06-21 MED ORDER — SODIUM CHLORIDE 0.9 % IV SOLN
INTRAVENOUS | Status: AC | PRN
  Administered 2017-06-21: 1000 mL via INTRAVENOUS

## 2017-06-21 MED ORDER — ALBUTEROL SULFATE (2.5 MG/3ML) 0.083% IN NEBU
2.5000 mg | INHALATION_SOLUTION | Freq: Four times a day (QID) | RESPIRATORY_TRACT | Status: DC
  Administered 2017-06-21 – 2017-06-29 (×31): 2.5 mg via RESPIRATORY_TRACT
  Filled 2017-06-21 (×30): qty 3

## 2017-06-21 MED ORDER — CHLORHEXIDINE GLUCONATE CLOTH 2 % EX PADS
6.0000 | MEDICATED_PAD | Freq: Every day | CUTANEOUS | Status: AC
  Administered 2017-06-22 – 2017-06-26 (×5): 6 via TOPICAL

## 2017-06-21 MED ORDER — LEVOFLOXACIN IN D5W 750 MG/150ML IV SOLN
750.0000 mg | Freq: Once | INTRAVENOUS | Status: AC
  Administered 2017-06-21: 750 mg via INTRAVENOUS
  Filled 2017-06-21: qty 150

## 2017-06-21 MED ORDER — TORSEMIDE 20 MG PO TABS: 10.0000 mg | ORAL_TABLET | Freq: Every day | ORAL | Status: DC

## 2017-06-21 MED ORDER — ROCURONIUM BROMIDE 50 MG/5ML IV SOLN
INTRAVENOUS | Status: AC | PRN
  Administered 2017-06-21: 100 mg via INTRAVENOUS

## 2017-06-21 MED ORDER — LEVOTHYROXINE SODIUM 150 MCG PO TABS: 150.0000 ug | ORAL_TABLET | Freq: Every day | ORAL | Status: DC

## 2017-06-21 MED ORDER — DEXTROSE 5 % IV SOLN
2.0000 g | Freq: Three times a day (TID) | INTRAVENOUS | Status: DC
  Administered 2017-06-21 – 2017-06-22 (×3): 2 g via INTRAVENOUS
  Filled 2017-06-21 (×6): qty 2

## 2017-06-21 MED ORDER — LORAZEPAM 2 MG/ML IJ SOLN
INTRAMUSCULAR | Status: AC
  Administered 2017-06-21: 2 mg via INTRAVENOUS
  Filled 2017-06-21: qty 1

## 2017-06-21 MED ORDER — POTASSIUM CHLORIDE IN NACL 20-0.9 MEQ/L-% IV SOLN
INTRAVENOUS | Status: DC
  Filled 2017-06-21 (×2): qty 1000

## 2017-06-21 MED ORDER — LORAZEPAM 2 MG/ML IJ SOLN: 1.0000 mg | INTRAMUSCULAR | Status: DC | PRN

## 2017-06-21 MED ORDER — DEXTROSE 5 % IV SOLN
0.0000 ug/min | INTRAVENOUS | Status: DC
  Administered 2017-06-21: 2 ug/min via INTRAVENOUS
  Filled 2017-06-21 (×4): qty 4

## 2017-06-21 MED ORDER — PANTOPRAZOLE SODIUM 40 MG IV SOLR
40.0000 mg | INTRAVENOUS | Status: DC
Start: 2017-06-21 — End: 2017-06-22
  Administered 2017-06-21: 40 mg via INTRAVENOUS
  Filled 2017-06-21: qty 40

## 2017-06-21 MED ORDER — ALBUTEROL SULFATE (2.5 MG/3ML) 0.083% IN NEBU
INHALATION_SOLUTION | RESPIRATORY_TRACT | Status: AC
  Filled 2017-06-21: qty 3

## 2017-06-21 MED ORDER — FLUPHENAZINE HCL 2.5 MG PO TABS: 12.5000 mg | ORAL_TABLET | Freq: Two times a day (BID) | ORAL | Status: DC

## 2017-06-21 MED ORDER — DIVALPROEX SODIUM 125 MG PO CSDR
750.0000 mg | DELAYED_RELEASE_CAPSULE | Freq: Three times a day (TID) | ORAL | Status: DC
  Filled 2017-06-21 (×2): qty 6

## 2017-06-21 MED ORDER — TAMSULOSIN HCL 0.4 MG PO CAPS: 0.4000 mg | ORAL_CAPSULE | Freq: Every day | ORAL | Status: DC

## 2017-06-21 MED ORDER — FENTANYL CITRATE (PF) 100 MCG/2ML IJ SOLN
100.0000 ug | INTRAMUSCULAR | Status: DC | PRN
  Filled 2017-06-21 (×2): qty 2

## 2017-06-21 MED ORDER — VANCOMYCIN HCL 10 G IV SOLR
1250.0000 mg | Freq: Three times a day (TID) | INTRAVENOUS | Status: DC
  Administered 2017-06-21 – 2017-06-22 (×4): 1250 mg via INTRAVENOUS
  Filled 2017-06-21 (×7): qty 1250

## 2017-06-21 MED ORDER — ACETAMINOPHEN 325 MG PO TABS: 650.0000 mg | ORAL_TABLET | Freq: Four times a day (QID) | ORAL | Status: DC | PRN

## 2017-06-21 MED ORDER — ACETAMINOPHEN 650 MG RE SUPP: 650.0000 mg | Freq: Four times a day (QID) | RECTAL | Status: DC | PRN

## 2017-06-21 MED ORDER — ETOMIDATE 2 MG/ML IV SOLN
INTRAVENOUS | Status: AC | PRN
  Administered 2017-06-21: 20 mg via INTRAVENOUS

## 2017-06-21 MED ORDER — MUPIROCIN 2 % EX OINT
1.0000 "application " | TOPICAL_OINTMENT | Freq: Two times a day (BID) | CUTANEOUS | Status: AC
  Administered 2017-06-21 – 2017-06-26 (×10): 1 via NASAL
  Filled 2017-06-21 (×2): qty 22

## 2017-06-21 MED ORDER — ONDANSETRON HCL 4 MG/2ML IJ SOLN: 4.0000 mg | Freq: Four times a day (QID) | INTRAMUSCULAR | Status: DC | PRN

## 2017-06-21 MED ORDER — ATROPINE SULFATE 1 MG/ML IJ SOLN
INTRAMUSCULAR | Status: AC | PRN
  Administered 2017-06-21: 1 mg via INTRAVENOUS

## 2017-06-21 MED ORDER — DEXTROSE 5 % IV SOLN
2.0000 g | Freq: Once | INTRAVENOUS | Status: AC
  Administered 2017-06-21: 2 g via INTRAVENOUS
  Filled 2017-06-21 (×2): qty 2

## 2017-06-21 MED ORDER — DEXTROSE 5 % IV SOLN
750.0000 mg | Freq: Three times a day (TID) | INTRAVENOUS | Status: DC
  Administered 2017-06-21 – 2017-06-25 (×11): 750 mg via INTRAVENOUS
  Filled 2017-06-21 (×14): qty 7.5

## 2017-06-21 MED ORDER — LORAZEPAM 2 MG/ML IJ SOLN
2.0000 mg | Freq: Once | INTRAMUSCULAR | Status: AC
  Administered 2017-06-21: 2 mg via INTRAVENOUS

## 2017-06-21 MED ORDER — ORAL CARE MOUTH RINSE
15.0000 mL | OROMUCOSAL | Status: DC
  Administered 2017-06-21 – 2017-06-28 (×64): 15 mL via OROMUCOSAL

## 2017-06-21 MED ORDER — FLUPHENAZINE HCL 5 MG PO TABS
15.0000 mg | ORAL_TABLET | Freq: Every day | ORAL | Status: DC
  Administered 2017-06-22 – 2017-06-24 (×4): 15 mg via ORAL
  Filled 2017-06-21 (×5): qty 3

## 2017-06-21 MED ORDER — VANCOMYCIN HCL IN DEXTROSE 1-5 GM/200ML-% IV SOLN
1000.0000 mg | Freq: Once | INTRAVENOUS | Status: AC
  Administered 2017-06-21: 1000 mg via INTRAVENOUS
  Filled 2017-06-21: qty 200

## 2017-06-21 MED ORDER — MIDAZOLAM HCL 2 MG/2ML IJ SOLN
2.0000 mg | INTRAMUSCULAR | Status: DC | PRN
  Administered 2017-06-21 – 2017-06-26 (×14): 2 mg via INTRAVENOUS
  Filled 2017-06-21 (×18): qty 2

## 2017-06-21 MED ORDER — POLYETHYLENE GLYCOL 3350 17 G PO PACK
17.0000 g | PACK | Freq: Every day | ORAL | Status: DC
  Administered 2017-06-21 – 2017-06-25 (×5): 17 g via NASOGASTRIC
  Filled 2017-06-21 (×5): qty 1

## 2017-06-21 MED ORDER — LORAZEPAM 2 MG/ML IJ SOLN
2.0000 mg | INTRAMUSCULAR | Status: DC | PRN
Start: 2017-06-21 — End: 2017-06-25
  Administered 2017-06-25: 2 mg via INTRAVENOUS
  Filled 2017-06-21: qty 1

## 2017-06-21 MED ORDER — POTASSIUM CHLORIDE 2 MEQ/ML IV SOLN
INTRAVENOUS | Status: DC
  Administered 2017-06-21 – 2017-06-24 (×7): via INTRAVENOUS
  Filled 2017-06-21 (×11): qty 1000

## 2017-06-21 MED ORDER — ACETAMINOPHEN 325 MG PO TABS
650.0000 mg | ORAL_TABLET | Freq: Four times a day (QID) | ORAL | Status: DC | PRN
  Administered 2017-06-28: 650 mg
  Filled 2017-06-21: qty 2

## 2017-06-21 MED ORDER — CLONAZEPAM 1 MG PO TABS: 1.0000 mg | ORAL_TABLET | Freq: Every day | ORAL | Status: DC

## 2017-06-21 MED ORDER — ONDANSETRON HCL 4 MG PO TABS
4.0000 mg | ORAL_TABLET | Freq: Four times a day (QID) | ORAL | Status: DC | PRN
Start: 2017-06-21 — End: 2017-06-21

## 2017-06-21 MED ORDER — NALOXONE HCL 2 MG/2ML IJ SOSY
PREFILLED_SYRINGE | INTRAMUSCULAR | Status: AC
  Filled 2017-06-21: qty 2

## 2017-06-21 MED ORDER — FLUPHENAZINE HCL 10 MG PO TABS: 10.0000 mg | ORAL_TABLET | Freq: Two times a day (BID) | ORAL | Status: DC

## 2017-06-21 NOTE — Progress Notes (Signed)
Pharmacy Antibiotic Note  Jerry [name] is a 61 y.o. male admitted on 06/21/2017 with Sepsis secondary to HCAP.  Pharmacy has been consulted for Vancomycin and ceftazidime dosing. Patient will also receive azithromycin  Patient received Vancomycin 1gm IV, Aztreonam 2g IV, and Levofloxacin 750mg  IV x 1 dose in ED.   There is a listed PCN allergy, which is reported to be Hives according to outpatient records.   Plan: Ke: 0.104   Vd: 70   T1/2: 6.7 DW: 100kg   Will start patient on Vancomycin 1250mg  IV every 8 hours. Calculated trough at Css is 15. Expect to see accumulation due to BMI of 40. Trough ordered prior to 4th dose.   Will start patient on ceftazidime 2g IV every 8 hours.   Height: 6\' 1"  (185.4 cm) Weight: 300 lb (136.1 kg) IBW/kg (Calculated) : 79.9  Temp (24hrs), Avg:85.2 F (29.6 C), Min:85 F (29.4 C), Max:85.5 F (29.7 C)   Recent Labs Lab 06/21/17 1138 06/21/17 1452  WBC 2.8* 2.9*  CREATININE 0.53*  --   LATICACIDVEN 1.5  --     Estimated Creatinine Clearance: 140.4 mL/min (A) (by C-G formula based on SCr of 0.53 mg/dL (L)).    Allergies  Allergen Reactions  . Methadone   . Penicillins     .Has patient had a PCN reaction causing immediate rash, facial/tongue/throat swelling, SOB or lightheadedness with hypotension: Unknown Has patient had a PCN reaction causing severe rash involving mucus membranes or skin necrosis: Unknown Has patient had a PCN reaction that required hospitalization: Unknown Has patient had a PCN reaction occurring within the last 10 years: Unknown If all of the above answers are "NO", then may proceed with Cephalosporin use.   Marland Kitchen Propoxyphene   . Sulfa Antibiotics     Antimicrobials this admission: 7/26 levofloxacin/Aztreonam x1 dose 7/26 vancomycin  >>  7/26 Ceftazidime>> 7/26 azithromycin>>    Dose adjustments this admission:  Microbiology results: 7/26  BCx: pending 7/26 Sputum/Respiratory: sent 7/26 MRSA PCR: sent  Thank  you for allowing pharmacy to be a part of this patient's care.  Pernell Dupre, PharmD, BCPS Clinical Pharmacist 06/21/2017 3:29 PM

## 2017-06-21 NOTE — Progress Notes (Signed)
PA Bincy at patients bedside. Blood pressure dropped and orders placed for levophed. Map goal of 65.

## 2017-06-21 NOTE — Progress Notes (Signed)
ET tube withdrawn to 23 cm mark per MD

## 2017-06-21 NOTE — ED Notes (Signed)
Patient placed on bear hugger at 43 degrees [place]

## 2017-06-21 NOTE — H&P (Signed)
[facility] NAME: [name]    MR#:  [id number]  DATE OF BIRTH:  03-08-1956  DATE OF ADMISSION:  06/21/2017  PRIMARY CARE PHYSICIAN: No primary care provider on file.   REQUESTING/REFERRING PHYSICIAN:   CHIEF COMPLAINT:   Chief Complaint  Patient presents with  . Code Sepsis    HISTORY OF PRESENT ILLNESS: Jerry [name]  is a 61 y.o. male with a known history of Schizophrenia, hypothyroidism, hypertension, BPH, chronic pain syndrome, pressure sores, bedbound, obesity, COPD, CHF, rheumatoid arthritis, who presents to the hospital after he was found to be unresponsive in the facility where he resides. He was hypothermic, unresponsive, bradycardic, however, had a good blood pressure. He was given Narcan with no significant improvement of his heart rate for mental status. He was intubated in the emergency room.Marland Kitchen His chest x-ray revealed pneumonia. Labs showed terms of thrombocytopenia, leukopenia, metabolic alkalosis, mild hyperglycemia, but not lactic acidosis. Hospitalist services were contacted for admission   PAST MEDICAL HISTORY:   Past Medical History:  Diagnosis Date  . CHF (congestive heart failure) ([place])   . Hypertension   . Thyroid disease     PAST SURGICAL HISTORY: No past surgical history on file.  SOCIAL HISTORY:  Social History  Substance Use Topics  . Smoking status: Not on file  . Smokeless tobacco: Not on file  . Alcohol use Not on file    FAMILY HISTORY: No family history on file.  DRUG ALLERGIES:  Allergies  Allergen Reactions  . Methadone   . Penicillins     .Has patient had a PCN reaction causing immediate rash, facial/tongue/throat swelling, SOB or lightheadedness with hypotension: Unknown Has patient had a PCN reaction causing severe rash involving mucus membranes or skin necrosis: Unknown Has patient had a PCN reaction that required hospitalization: Unknown Has patient had a PCN reaction  occurring within the last 10 years: Unknown If all of the above answers are "NO", then may proceed with Cephalosporin use.   Marland Kitchen Propoxyphene   . Sulfa Antibiotics     Review of Systems  Unable to perform ROS: Mental acuity    MEDICATIONS AT HOME:  Prior to Admission medications   Medication Sig Start Date End Date Taking? Authorizing Provider  albuterol (PROVENTIL HFA;VENTOLIN HFA) 108 (90 Base) MCG/ACT inhaler Inhale 1 puff into the lungs every 3 (three) hours as needed for wheezing or shortness of breath.   Yes [provider]  amLODipine (NORVASC) 10 MG tablet Take 10 mg by mouth daily.   Yes [provider]  benztropine (COGENTIN) 0.5 MG tablet Take 0.5 mg by mouth 2 (two) times daily.   Yes [provider]  carvedilol (COREG) 25 MG tablet Take 25 mg by mouth 2 (two) times daily with a meal.   Yes [provider]  clonazePAM (KLONOPIN) 1 MG tablet Take 1 mg by mouth at bedtime.   Yes [provider]  cloNIDine (CATAPRES - DOSED IN MG/24 HR) 0.3 mg/24hr patch Place 0.3 mg onto the skin once a week. Tuesday   Yes [provider]  divalproex (DEPAKOTE SPRINKLE) 125 MG capsule Take 750 mg by mouth 3 (three) times daily.   Yes [provider]  docusate sodium (COLACE) 100 MG capsule Take 100 mg by mouth 2 (two) times daily.   Yes [provider]  fluPHENAZine (PROLIXIN) 2.5 MG tablet Take 12.5-15 mg by mouth 2 (two) times daily. Take 12.5 mg by mouth  in the morning and 15 mg by mouth at bedtime.   Yes [provider]  hydroxychloroquine (PLAQUENIL) 200 MG tablet Take 200 mg by mouth 2 (two) times daily.   Yes [provider]  levothyroxine (SYNTHROID, LEVOTHROID) 150 MCG tablet Take 150 mcg by mouth daily before breakfast.   Yes [provider]  methocarbamol (ROBAXIN) 750 MG tablet Take 750 mg by mouth at bedtime.   Yes [provider]  Multiple Vitamins-Minerals (MULTIVITAMIN WITH  MINERALS) tablet Take 1 tablet by mouth daily.   Yes [provider]  Polyethylene Glycol 3350 (MIRALAX PO) Take 17 g by mouth daily.   Yes [provider]  potassium chloride (MICRO-K) 10 MEQ CR capsule Take 10 mEq by mouth daily.   Yes [provider]  tamsulosin (FLOMAX) 0.4 MG CAPS capsule Take 0.4 mg by mouth daily.   Yes [provider]  torsemide (DEMADEX) 10 MG tablet Take 10 mg by mouth daily.   Yes [provider]  traMADol (ULTRAM) 50 MG tablet Take 50 mg by mouth every 4 (four) hours as needed for moderate pain or severe pain.   Yes [provider]  traZODone (DESYREL) 150 MG tablet Take 150 mg by mouth at bedtime.   Yes [provider]      PHYSICAL EXAMINATION:   VITAL SIGNS: Blood pressure (!) 154/97, pulse (!) 47, temperature (!) 85 F (29.4 C), temperature source Rectal, resp. rate 16, weight 136.1 kg (300 lb), SpO2 99 %.  GENERAL:  61 y.o.-year-old patient lying in the bed with no acute distress Unresponsive to verbal or pain stimuli, pupils are 3 to 4 mm, reactive to light.  EYES: Pupils equal, round, reactive to light and accommodation. No scleral icterus. Extraocular muscles intact.  HEENT: Head atraumatic, normocephalic. Oropharynx and nasopharynx clear.  NECK:  Supple, no jugular venous distention. No thyroid enlargement, no tenderness.  LUNGS: Normal breath sounds. On the right, markedly diminished on the left. Few crackles on the left, no wheezing, rales,rhonchi or crepitation. No use of accessory muscles of respiration, On mechanical ventilation. ET tube is in place  CARDIOVASCULAR: S1, S2please was regular, bradycardic. No murmurs, rubs, or gallops.  ABDOMEN: Soft, nontender, nondistended. Bowel sounds present. No organomegaly or mass. Foley catheter  is in place draining amber colored urine  EXTREMITIES:Some pedal edema, , no edema in lower extremities. Overall, no cyanosis, or clubbing.  NEUROLOGIC:  Cranial nerves . Muscle strength 5/5 in all extremities . Unable to evaluate. Sensationunable to assess. Gait not checked.  PSYCHIATRIC: The patient iscomatose, not able to wake him up with painful or verbal stimuli . Pupils are 3-4 mm, reactive to light  SKIN: No obvious rash, lesion, or ulcer.   LABORATORY PANEL:   CBC  Recent Labs Lab 06/21/17 1138  WBC 2.8*  HGB 12.3*  HCT 36.7*  PLT 22*  MCV 89.3  MCH 30.0  MCHC 33.6  RDW 17.3*  LYMPHSABS 0.6*  MONOABS 0.2  EOSABS 0.0  BASOSABS 0.0   ------------------------------------------------------------------------------------------------------------------  Chemistries   Recent Labs Lab 06/21/17 1138  NA 144  K 3.9  CL 102  CO2 35*  GLUCOSE 119*  BUN 16  CREATININE 0.53*  CALCIUM 9.0  AST 38  ALT 39  ALKPHOS 92  BILITOT 0.5   ------------------------------------------------------------------------------------------------------------------  Cardiac Enzymes No results for input(s): TROPONINI in the last 168 hours. ------------------------------------------------------------------------------------------------------------------  RADIOLOGY: Dg Abd 1 View  Result Date: 06/21/2017 CLINICAL DATA:  Possible sepsis. Status post orogastric tube placement.  EXAM: ABDOMEN - 1 VIEW COMPARISON:  None in PACs FINDINGS: The patient has undergone placement of an esophagogastric tube whose tip and proximal port lie in the gastric body. There are loops of mildly distended gas-filled small bowel in the mid and lower abdomen. IMPRESSION: Successful placement of an esophagogastric tube with reasonable positioning of its tip. Electronically Signed   By: David  Martinique M.D.   On: 06/21/2017 12:23   Ct Head Wo Contrast  Result Date: 06/21/2017 CLINICAL DATA:  Unresponsive.  Possible stroke. EXAM: CT HEAD WITHOUT CONTRAST TECHNIQUE: Contiguous axial images were obtained from the base of the skull through the vertex without intravenous  contrast. COMPARISON:  None. FINDINGS: Brain: There is no evidence of acute infarct, intracranial hemorrhage, mass, midline shift, or extra-axial fluid collection. There is mild cerebral atrophy, mildly prominent for age. Vascular: Calcified atherosclerosis at the skullbase. No hyperdense vessel. Skull: No fracture or suspicious osseous lesion. Retained metallic BB in the right parietal skull. Sinuses/Orbits: Moderate volume right sphenoid sinus fluid. Mild bilateral ethmoid air cell mucosal thickening. Trace bilateral mastoid fluid. Unremarkable orbits. Other: Endotracheal tube on topogram. IMPRESSION: No evidence of acute intracranial abnormality. Electronically Signed   By: Logan Bores M.D.   On: 06/21/2017 12:59   Dg Chest Port 1 View  Result Date: 06/21/2017 CLINICAL DATA:  Respiratory failure, intubated patient. History of CHF, hypertension, possible sepsis. EXAM: PORTABLE CHEST 1 VIEW COMPARISON:  Portable chest x-ray of October 11, 2012 FINDINGS: The lungs are hypoinflated. External pacemaker defibrillator pads are present. The interstitial markings of both lungs are increased greatest on the left. The cardiac silhouette is enlarged and indistinct. Small bilateral pleural effusions are suspected. The endotracheal tube tip lies approximately 1.5 cm above the carina. IMPRESSION: Limited study due to hypoinflation. Diffuse interstitial and early alveolar opacities predominantly on the left are worrisome for pneumonia. Underlying low-grade CHF is suspected. Electronically Signed   By: David  Martinique M.D.   On: 06/21/2017 12:22    EKG: Orders placed or performed in visit on 10/11/12  . EKG 12-Lead   EKG in the emergency room revealed sinus bradycardia at 42 bpm, intravesical conduction delay, nonspecific ST-T changes    IMPRESSION AND PLAN:  Active Problems:   Sepsis ([place])   Healthcare-associated pneumonia   Acute respiratory failure with hypoxia and hypercapnia (H[facility])   Acute encephalopathy    Thrombocytopenia ([facility])   Leukopenia  #1. Sepsis due to pneumonia, in addition to medical floor and initiate broad-spectrum antibiotic therapy after cultures are taken, adjust antibiotic depending on culture results  #2. Acute  respiratory failure with hypoxia and hypercapnia, continue mechanical ventilation, discussed with pulmonologist, pulmonologist consultation is requested  #3. Pneumonia, question of aspiration pneumonitis, initiated patient on broad-spectrum antibiotic therapy. Discussed with Dr. Alva Garnet #4. Leukopenia, follow with therapy  #5 thrombocytopenia, questionable ITP, questionable Plaquenil toxicity, unlikely DIC as ProTime is normal, follow platelet count, get oncologist/hematologist involved  #6. Hypothyroidism, continue Synthroid, TSH was checked, was found to be 5.3    All the records are reviewed and case discussed with ED provider. Management plans discussed with the patient, family and they are in agreement.  CODE STATUS: Code Status History    This patient does not have a recorded code status. Please follow your organizational policy for patients in this situation.       TOTAL TIME TAKING CARE OF THIS PATIENT: 60  minutes.    Theodoro Grist M.D on 06/21/2017 at 2:22 PM  Between 7am to 6pm - Pager -  [phone] After 6pm go to [email or web address] - password [email or web address] [facility]  [facility]  Office  [phone]  CC: Primary care physician; No primary care provider on file.

## 2017-06-21 NOTE — ED Notes (Signed)
This nurse and primary nurse Vicente Males with respiratory therapist to CT with patient

## 2017-06-21 NOTE — Consult Note (Signed)
[facility]  Date of admission:  06/21/2017  Inpatient day:  06/21/2017  Consulting physician: Dr. Theodoro Grist   Reason for Consultation:  Thrombocytopenia.  Chief Complaint: Jerry [name] is a 61 y.o. male with schizophrenia who was admitted through the emergency room with presumed sepsis.  HPI:  The patient is unable to provide any history.  The patient's nephew notes that about 2 weeks ago, he was alert , happy, and joking.  The patient is bedbound.  Notes indicate that the patient was weak yesterday, choking on his food, and not doing as well as normal.  He was unresponsive this AM.  Temperature and pulse were low.  He had recently been treated with Macrobid for a UTI.  EMS noted bradycardia (pulse 40s - 50s) and hypothermia (87 - 88).  He did not improve with Narcan.  He was intubated in the ER.  Labs revealed a hematocrit of hematocrit of 36.7, hemoglobin 12.3, MCV 89.3, platelets 22,000, WBC 2800 with an ANC of 2000.  PT was 14.2 with an INR of 1.1.  Urinalysis revealed large Hgb, ketones, large leukocytes, TNTC RBCs and WBCs.  Bicarb was 35 (high).  Creatinine was 0.53 (baseline 0.75).  Albumen was 3.0.  LFTs were normal.  Lactic acid was 1.5.  TSH was 5.336 (high).  Valproic acid was 39 (low).  Peripheral smear revealed leukopenia and thrombocytopenia.  There were no schistocytes.  Head CT without contrast revealed no acute abnormality.  CXR revealed diffuse interstitial and early alveolar opacities predominantly on the left worrisome for pneumonia. There was underlying low-grade CHF.  Review of available labs reveals a normal CBC on 01/19/2013.  Hematocrit was 34, hemoglobin 11.2, platelets 334,000, WBC 6700 with an ANC of 4600.   Past Medical History:  Diagnosis Date  . CHF (congestive heart failure) ([place])   . Hypertension   . Thyroid disease     No past surgical history on file.  No family history on file.  Social History:  has no tobacco, alcohol,  and drug history on file.  The patient is a resident at [facility].  He is accompanied by his nephew, Florina Ou [phone]).  The patient's niece, Scarlette Ar Dedric Ethington is his medical power of attorney [phone]).  She lives in [place] and will be arriving tomorrow (06/22/2017).  Allergies:  Allergies  Allergen Reactions  . Methadone   . Penicillins     .Has patient had a PCN reaction causing immediate rash, facial/tongue/throat swelling, SOB or lightheadedness with hypotension: Unknown Has patient had a PCN reaction causing severe rash involving mucus membranes or skin necrosis: Unknown Has patient had a PCN reaction that required hospitalization: Unknown Has patient had a PCN reaction occurring within the last 10 years: Unknown If all of the above answers are "NO", then may proceed with Cephalosporin use.   Marland Kitchen Propoxyphene   . Sulfa Antibiotics     Medications Prior to Admission  Medication Sig Dispense Refill  . albuterol (PROVENTIL HFA;VENTOLIN HFA) 108 (90 Base) MCG/ACT inhaler Inhale 1 puff into the lungs every 3 (three) hours as needed for wheezing or shortness of breath.    Marland Kitchen amLODipine (NORVASC) 10 MG tablet Take 10 mg by mouth daily.    . benztropine (COGENTIN) 0.5 MG tablet Take 0.5 mg by mouth 2 (two) times daily.    . carvedilol (COREG) 25 MG tablet Take 25 mg by mouth 2 (two) times daily with a meal.    . clonazePAM (KLONOPIN) 1 MG tablet Take  1 mg by mouth at bedtime.    . cloNIDine (CATAPRES - DOSED IN MG/24 HR) 0.3 mg/24hr patch Place 0.3 mg onto the skin once a week. Tuesday    . divalproex (DEPAKOTE SPRINKLE) 125 MG capsule Take 750 mg by mouth 3 (three) times daily.    Marland Kitchen docusate sodium (COLACE) 100 MG capsule Take 100 mg by mouth 2 (two) times daily.    . fluPHENAZine (PROLIXIN) 2.5 MG tablet Take 12.5-15 mg by mouth 2 (two) times daily. Take 12.5 mg by mouth in the morning and 15 mg by mouth at bedtime.    . hydroxychloroquine (PLAQUENIL) 200 MG tablet  Take 200 mg by mouth 2 (two) times daily.    Marland Kitchen levothyroxine (SYNTHROID, LEVOTHROID) 150 MCG tablet Take 150 mcg by mouth daily before breakfast.    . methocarbamol (ROBAXIN) 750 MG tablet Take 750 mg by mouth at bedtime.    . Multiple Vitamins-Minerals (MULTIVITAMIN WITH MINERALS) tablet Take 1 tablet by mouth daily.    . Polyethylene Glycol 3350 (MIRALAX PO) Take 17 g by mouth daily.    . potassium chloride (MICRO-K) 10 MEQ CR capsule Take 10 mEq by mouth daily.    . tamsulosin (FLOMAX) 0.4 MG CAPS capsule Take 0.4 mg by mouth daily.    Marland Kitchen torsemide (DEMADEX) 10 MG tablet Take 10 mg by mouth daily.    . traMADol (ULTRAM) 50 MG tablet Take 50 mg by mouth every 4 (four) hours as needed for moderate pain or severe pain.    . traZODone (DESYREL) 150 MG tablet Take 150 mg by mouth at bedtime.      Review of Systems: Unable to assess as the patient is intubated and unresponsive.  Physical Exam:  Blood pressure (!) 83/60, pulse 65, temperature (!) 94.3 F (34.6 C), resp. rate 18, height _0  (1.854 m), weight 300 lb (136.1 kg), SpO2 100 %.  GENERAL:  Well developed, well nourished, gentleman intubated in the ICU.  He is under a warming air blanket. MENTAL STATUS:  Unresponsive. HEAD:  Normocephalic, atraumatic, face symmetric, no Cushingoid features. EYES:  Brown eyes.  Pupils 4-5 mm, minimally reactive to light.  No conjunctivitis or scleral icterus. ENT:  Orally intubated.  Oropharynx difficult to visualize.  Mucous membranes moist.  RESPIRATORY:  Clear to auscultation anteriorly without rales, wheezes or rhonchi. CARDIOVASCULAR:  Bradycardia.  Regular rate and rhythm without murmur, rub or gallop. ABDOMEN:  Soft, non-tender, with active bowel sounds, and no hepatosplenomegaly.  No masses. SKIN:  Sacral pressure ulcer per nursing.  No petechiae.  No rashes, ulcers or lesions. EXTREMITIES: Arthritic changes in hands. Bilateral foot drop.  No edema, no skin discoloration or tenderness.  No  palpable cords. LYMPH NODES: No palpable cervical, supraclavicular, axillary or inguinal adenopathy  NEUROLOGICAL: Unresponsive.  Unarousable to stimuli. PSYCH: Unresponsive.   Results for orders placed or performed during the hospital encounter of 06/21/17 (from the past 48 hour(s))  Urinalysis, Complete w Microscopic     Status: Abnormal   Collection Time: 06/21/17 11:26 AM  Result Value Ref Range   Color, Urine AMBER (A) YELLOW    Comment: BIOCHEMICALS MAY BE AFFECTED BY COLOR   APPearance HAZY (A) CLEAR   Specific Gravity, Urine 1.025 1.005 - 1.030   pH 5.0 5.0 - 8.0   Glucose, UA NEGATIVE NEGATIVE mg/dL   Hgb urine dipstick LARGE (A) NEGATIVE   Bilirubin Urine NEGATIVE NEGATIVE   Ketones, ur 5 (A) NEGATIVE mg/dL   Protein, ur 30 (A)  NEGATIVE mg/dL   Nitrite NEGATIVE NEGATIVE   Leukocytes, UA LARGE (A) NEGATIVE   RBC / HPF TOO NUMEROUS TO COUNT 0 - 5 RBC/hpf   WBC, UA TOO NUMEROUS TO COUNT 0 - 5 WBC/hpf   Bacteria, UA NONE SEEN NONE SEEN   Squamous Epithelial / LPF 0-5 (A) NONE SEEN   WBC Clumps PRESENT    Mucous PRESENT    Hyaline Casts, UA PRESENT   Glucose, capillary     Status: Abnormal   Collection Time: 06/21/17 11:37 AM  Result Value Ref Range   Glucose-Capillary 113 (H) 65 - 99 mg/dL  Comprehensive metabolic panel     Status: Abnormal   Collection Time: 06/21/17 11:38 AM  Result Value Ref Range   Sodium 144 135 - 145 mmol/L   Potassium 3.9 3.5 - 5.1 mmol/L   Chloride 102 101 - 111 mmol/L   CO2 35 (H) 22 - 32 mmol/L   Glucose, Bld 119 (H) 65 - 99 mg/dL   BUN 16 6 - 20 mg/dL   Creatinine, Ser 0.53 (L) 0.61 - 1.24 mg/dL   Calcium 9.0 8.9 - 10.3 mg/dL   Total Protein 7.1 6.5 - 8.1 g/dL   Albumin 3.0 (L) 3.5 - 5.0 g/dL   AST 38 15 - 41 U/L   ALT 39 17 - 63 U/L   Alkaline Phosphatase 92 38 - 126 U/L   Total Bilirubin 0.5 0.3 - 1.2 mg/dL   GFR calc non Af Amer >60 >60 mL/min   GFR calc Af Amer >60 >60 mL/min    Comment: (NOTE) The eGFR has been calculated  using the CKD EPI equation. This calculation has not been validated in all clinical situations. eGFR's persistently <60 mL/min signify possible Chronic Kidney Disease.    Anion gap 7 5 - 15  Lactic acid, plasma     Status: None   Collection Time: 06/21/17 11:38 AM  Result Value Ref Range   Lactic Acid, Venous 1.5 0.5 - 1.9 mmol/L  CBC with Differential     Status: Abnormal   Collection Time: 06/21/17 11:38 AM  Result Value Ref Range   WBC 2.8 (L) 3.8 - 10.6 K/uL   RBC 4.11 (L) 4.40 - 5.90 MIL/uL   Hemoglobin 12.3 (L) 13.0 - 18.0 g/dL   HCT 36.7 (L) 40.0 - 52.0 %   MCV 89.3 80.0 - 100.0 fL   MCH 30.0 26.0 - 34.0 pg   MCHC 33.6 32.0 - 36.0 g/dL   RDW 17.3 (H) 11.5 - 14.5 %   Platelets 22 (LL) 150 - 440 K/uL    Comment: RESULT REPEATED AND VERIFIED PLATELET COUNT CONFIRMED BY SMEAR CRITICAL RESULT CALLED TO, READ BACK BY AND VERIFIED WITH: ANNA HOLT ON 06/21/17 AT 1247 QSD    Neutrophils Relative % 72 %   Neutro Abs 2.0 1.4 - 6.5 K/uL   Lymphocytes Relative 21 %   Lymphs Abs 0.6 (L) 1.0 - 3.6 K/uL   Monocytes Relative 6 %   Monocytes Absolute 0.2 0.2 - 1.0 K/uL   Eosinophils Relative 2 %   Eosinophils Absolute 0.0 0 - 0.7 K/uL   Basophils Relative 1 %   Basophils Absolute 0.0 0 - 0.1 K/uL   Smear Review MORPHOLOGY UNREMARKABLE     Comment: NO SCHISTOCYTES SEEN  Protime-INR     Status: None   Collection Time: 06/21/17 11:38 AM  Result Value Ref Range   Prothrombin Time 14.2 11.4 - 15.2 seconds   INR 1.10  Valproic acid level     Status: Abnormal   Collection Time: 06/21/17 11:38 AM  Result Value Ref Range   Valproic Acid Lvl 39 (L) 50.0 - 100.0 ug/mL  TSH     Status: Abnormal   Collection Time: 06/21/17 11:38 AM  Result Value Ref Range   TSH 5.336 (H) 0.350 - 4.500 uIU/mL    Comment: Performed by a 3rd Generation assay with a functional sensitivity of <=0.01 uIU/mL.  Acetaminophen level     Status: Abnormal   Collection Time: 06/21/17 11:38 AM  Result Value Ref  Range   Acetaminophen (Tylenol), Serum <10 (L) 10 - 30 ug/mL    Comment:        THERAPEUTIC CONCENTRATIONS VARY SIGNIFICANTLY. A RANGE OF 10-30 ug/mL MAY BE AN EFFECTIVE CONCENTRATION FOR MANY PATIENTS. HOWEVER, SOME ARE BEST TREATED AT CONCENTRATIONS OUTSIDE THIS RANGE. ACETAMINOPHEN CONCENTRATIONS >150 ug/mL AT 4 HOURS AFTER INGESTION AND >50 ug/mL AT 12 HOURS AFTER INGESTION ARE OFTEN ASSOCIATED WITH TOXIC REACTIONS.   Ethanol     Status: None   Collection Time: 06/21/17 11:38 AM  Result Value Ref Range   Alcohol, Ethyl (B) <5 <5 mg/dL    Comment:        LOWEST DETECTABLE LIMIT FOR SERUM ALCOHOL IS 5 mg/dL FOR MEDICAL PURPOSES ONLY   Salicylate level     Status: None   Collection Time: 06/21/17 11:38 AM  Result Value Ref Range   Salicylate Lvl <6.2 2.8 - 30.0 mg/dL  Blood gas, arterial     Status: Abnormal   Collection Time: 06/21/17 12:00 PM  Result Value Ref Range   FIO2 50.00    Delivery systems VENTILATOR    Mode PRESSURE REGULATED VOLUME CONTROL    VT 500 mL   LHR 18 resp/min   Peep/cpap 5.0 cm H20   pH, Arterial 7.47 (H) 7.350 - 7.450   pCO2 arterial 49 (H) 32.0 - 48.0 mmHg   pO2, Arterial 75 (L) 83.0 - 108.0 mmHg   Bicarbonate 35.7 (H) 20.0 - 28.0 mmol/L   Acid-Base Excess 10.6 (H) 0.0 - 2.0 mmol/L   O2 Saturation 95.8 %   Patient temperature 37.0    Collection site RIGHT RADIAL    Sample type ARTERIAL DRAW    Allens test (pass/fail) PASS PASS  Glucose, capillary     Status: Abnormal   Collection Time: 06/21/17  2:00 PM  Result Value Ref Range   Glucose-Capillary 142 (H) 65 - 99 mg/dL  CBC WITH DIFFERENTIAL     Status: Abnormal   Collection Time: 06/21/17  2:52 PM  Result Value Ref Range   WBC 2.9 (L) 3.8 - 10.6 K/uL   RBC 3.73 (L) 4.40 - 5.90 MIL/uL   Hemoglobin 11.1 (L) 13.0 - 18.0 g/dL   HCT 33.1 (L) 40.0 - 52.0 %   MCV 88.8 80.0 - 100.0 fL   MCH 29.7 26.0 - 34.0 pg   MCHC 33.5 32.0 - 36.0 g/dL   RDW 16.8 (H) 11.5 - 14.5 %   Platelets 19  (LL) 150 - 440 K/uL    Comment: CRITICAL VALUE NOTED.  VALUE IS CONSISTENT WITH PREVIOUSLY REPORTED AND CALLED VALUE. REPEATED TO VERIFY    Neutrophils Relative % 73 %   Lymphocytes Relative 19 %   Monocytes Relative 7 %   Eosinophils Relative 1 %   Basophils Relative 0 %   Neutro Abs 2.1 1.4 - 6.5 K/uL   Lymphs Abs 0.6 (L) 1.0 - 3.6 K/uL  Monocytes Absolute 0.2 0.2 - 1.0 K/uL   Eosinophils Absolute 0.0 0 - 0.7 K/uL   Basophils Absolute 0.0 0 - 0.1 K/uL  MRSA PCR Screening     Status: Abnormal   Collection Time: 06/21/17  3:23 PM  Result Value Ref Range   MRSA by PCR POSITIVE (A) NEGATIVE    Comment:        The GeneXpert MRSA Assay (FDA approved for NASAL specimens only), is one component of a comprehensive MRSA colonization surveillance program. It is not intended to diagnose MRSA infection nor to guide or monitor treatment for MRSA infections. CRITICAL RESULT CALLED TO, READ BACK BY AND VERIFIED WITH: SANDRA VORVA AT 8937 ON 06/21/2017 JJB   Urine Drug Screen, Qualitative ([place] only)     Status: Abnormal   Collection Time: 06/21/17  4:17 PM  Result Value Ref Range   Tricyclic, Ur Screen POSITIVE (A) NONE DETECTED   Amphetamines, Ur Screen NONE DETECTED NONE DETECTED   MDMA (Ecstasy)Ur Screen NONE DETECTED NONE DETECTED   Cocaine Metabolite,Ur [place] NONE DETECTED NONE DETECTED   Opiate, Ur Screen NONE DETECTED NONE DETECTED   Phencyclidine (PCP) Ur S NONE DETECTED NONE DETECTED   Cannabinoid 50 Ng, Ur [place] NONE DETECTED NONE DETECTED   Barbiturates, Ur Screen NONE DETECTED NONE DETECTED   Benzodiazepine, Ur Scrn NONE DETECTED NONE DETECTED   Methadone Scn, Ur NONE DETECTED NONE DETECTED    Comment: (NOTE) 342  Tricyclics, urine               Cutoff 1000 ng/mL 200  Amphetamines, urine             Cutoff 1000 ng/mL 300  MDMA (Ecstasy), urine           Cutoff 500 ng/mL 400  Cocaine Metabolite, urine       Cutoff 300 ng/mL 500  Opiate, urine                   Cutoff 300  ng/mL 600  Phencyclidine (PCP), urine      Cutoff 25 ng/mL 700  Cannabinoid, urine              Cutoff 50 ng/mL 800  Barbiturates, urine             Cutoff 200 ng/mL 900  Benzodiazepine, urine           Cutoff 200 ng/mL 1000 Methadone, urine                Cutoff 300 ng/mL 1100 1200 The urine drug screen provides only a preliminary, unconfirmed 1300 analytical test result and should not be used for non-medical 1400 purposes. Clinical consideration and professional judgment should 1500 be applied to any positive drug screen result due to possible 1600 interfering substances. A more specific alternate chemical method 1700 must be used in order to obtain a confirmed analytical result.  1800 Gas chromato graphy / mass spectrometry (GC/MS) is the preferred 1900 confirmatory method.   Troponin I     Status: None   Collection Time: 06/21/17  4:20 PM  Result Value Ref Range   Troponin I <0.03 <0.03 ng/mL  Procalcitonin - Baseline     Status: None   Collection Time: 06/21/17  4:20 PM  Result Value Ref Range   Procalcitonin <0.10 ng/mL    Comment:        Interpretation: PCT (Procalcitonin) <= 0.5 ng/mL: Systemic infection (sepsis) is not likely. Local bacterial infection is possible. (NOTE)  ICU PCT Algorithm               Non ICU PCT Algorithm    ----------------------------     ------------------------------         PCT < 0.25 ng/mL                 PCT < 0.1 ng/mL     Stopping of antibiotics            Stopping of antibiotics       strongly encouraged.               strongly encouraged.    ----------------------------     ------------------------------       PCT level decrease by               PCT < 0.25 ng/mL       >= 80% from peak PCT       OR PCT 0.25 - 0.5 ng/mL          Stopping of antibiotics                                             encouraged.     Stopping of antibiotics           encouraged.    ----------------------------     ------------------------------        PCT level decrease by              PCT >= 0.25 ng/mL       < 80% from peak PCT        AND PCT >= 0.5 ng/mL            Continuin g antibiotics                                              encouraged.       Continuing antibiotics            encouraged.    ----------------------------     ------------------------------     PCT level increase compared          PCT > 0.5 ng/mL         with peak PCT AND          PCT >= 0.5 ng/mL             Escalation of antibiotics                                          strongly encouraged.      Escalation of antibiotics        strongly encouraged. Performed at [facility], [address] [place], [place] [address]    Dg Abd 1 View  Result Date: 06/21/2017 CLINICAL DATA:  Possible sepsis. Status post orogastric tube placement. EXAM: ABDOMEN - 1 VIEW COMPARISON:  None in PACs FINDINGS: The patient has undergone placement of an esophagogastric tube whose tip and proximal port lie in the gastric body. There are loops of mildly distended gas-filled small bowel in the mid and lower abdomen. IMPRESSION: Successful placement of an esophagogastric tube with reasonable positioning of  its tip. Electronically Signed   By: David  Martinique M.D.   On: 06/21/2017 12:23   Ct Head Wo Contrast  Result Date: 06/21/2017 CLINICAL DATA:  Unresponsive.  Possible stroke. EXAM: CT HEAD WITHOUT CONTRAST TECHNIQUE: Contiguous axial images were obtained from the base of the skull through the vertex without intravenous contrast. COMPARISON:  None. FINDINGS: Brain: There is no evidence of acute infarct, intracranial hemorrhage, mass, midline shift, or extra-axial fluid collection. There is mild cerebral atrophy, mildly prominent for age. Vascular: Calcified atherosclerosis at the skullbase. No hyperdense vessel. Skull: No fracture or suspicious osseous lesion. Retained metallic BB in the right parietal skull. Sinuses/Orbits: Moderate volume right sphenoid sinus fluid. Mild bilateral  ethmoid air cell mucosal thickening. Trace bilateral mastoid fluid. Unremarkable orbits. Other: Endotracheal tube on topogram. IMPRESSION: No evidence of acute intracranial abnormality. Electronically Signed   By: Logan Bores M.D.   On: 06/21/2017 12:59   Dg Chest Port 1 View  Result Date: 06/21/2017 CLINICAL DATA:  Respiratory failure, intubated patient. History of CHF, hypertension, possible sepsis. EXAM: PORTABLE CHEST 1 VIEW COMPARISON:  Portable chest x-ray of October 11, 2012 FINDINGS: The lungs are hypoinflated. External pacemaker defibrillator pads are present. The interstitial markings of both lungs are increased greatest on the left. The cardiac silhouette is enlarged and indistinct. Small bilateral pleural effusions are suspected. The endotracheal tube tip lies approximately 1.5 cm above the carina. IMPRESSION: Limited study due to hypoinflation. Diffuse interstitial and early alveolar opacities predominantly on the left are worrisome for pneumonia. Underlying low-grade CHF is suspected. Electronically Signed   By: David  Martinique M.D.   On: 06/21/2017 12:22    Assessment:  The patient is a 61 y.o.  gentleman with schizophrenia admitted to the ICU unresponsive s/p intubation.  He remains bradycardic and hypothermic.  Presentation is c/w sepsis.  However lactic acid is normal.    Peripheral smear revealed leukopenia and thrombocytopenia.  There were no schistocytes.  Plan:   1. Hematology:  Thrombocytopenia likely secondary to sepsis and limited marrow reserve.  PT is normal.  r/o DIC.  Check PTT, fibrinogen.  No evidence of TTP/HUS.  Check ANA, hepatitis B and C serologies, B12, and folate.  HIV testing is pending.  Transfuse platelets as needed.  Discussed with patient's niece, his medical power of attorney.  Patient on medications which can be associated with thrombocytopenia (valproic acid 1-27% dose related), Prolixin (leukopenia and thrombocytopenia), and Plaquenil (agranulocytosis and  thrombocytopenia).  Urine toxicology screen unrevealing.  2.  Infectious disease: Patient on broad spectrum coverage.  Recent UTI on Macrobid.  No positive cultures.   3.  Neurology:  Patient unresponsive, hypothermic, and bradycardic worrisome for sepsis.  Head CT without contrast is negative.  TSH minimally elevated.  Check AM cortisol, thiamine (B1).  Consider neurology consultation.  Thank you for allowing me to participate in Troyce Gieske 's care.  I will follow him closely with you while hospitalized and after discharge in the outpatient department.   Lequita Asal, MD  06/21/2017, 10:06 PM

## 2017-06-21 NOTE — Consult Note (Signed)
PULMONARY / CRITICAL CARE MEDICINE   Name: Jerry [name] MRN: [id number] DOB: 03-May-1956    ADMISSION DATE:  06/21/2017  HISTORY OF PRESENT ILLNESS:   Level 5 caveat  PT PROFILE: 61 y.o. M with multiple medical problems including schizophrenia, hypothyroidism, rheumatoid arthritis, seizure disorder. He is a nursing home resident. He was found by staff unresponsive, bradycardic, hypothermic. Brought to the [facility] ED where he was intubated for decreased level of consciousness. Treated for rated cardia and hypothermia in the ED. Initial CXR reveals extensive left-sided infiltrate.   MAJOR EVENTS/TEST RESULTS: 07/26 admission as above 07/26 CT head: Negative  INDWELLING DEVICES:: ETT 07/26 >>   MICRO DATA: MRSA PCR 07/26 >>  Urine 07/26 >>  Resp 07/26 >>  Blood 07/26 >>  Strep Ag 07/26 >>  Legionella Ag 07/26 >>   PCT protocol 07/26  ANTIMICROBIALS:  Vanc 07/26 >>  Azithro 07/26 >>  Ceftaz 07/26 >>     PAST MEDICAL HISTORY :  He  has a past medical history of CHF (congestive heart failure) ([place]); Hypertension; and Thyroid disease.  PAST SURGICAL HISTORY: He  has no past surgical history on file.  Allergies  Allergen Reactions  . Methadone   . Penicillins     .Has patient had a PCN reaction causing immediate rash, facial/tongue/throat swelling, SOB or lightheadedness with hypotension: Unknown Has patient had a PCN reaction causing severe rash involving mucus membranes or skin necrosis: Unknown Has patient had a PCN reaction that required hospitalization: Unknown Has patient had a PCN reaction occurring within the last 10 years: Unknown If all of the above answers are "NO", then may proceed with Cephalosporin use.   Marland Kitchen Propoxyphene   . Sulfa Antibiotics     No current facility-administered medications on file prior to encounter.    No current outpatient prescriptions on file prior to encounter.    FAMILY HISTORY:  His has no family status information on file.   Family history unknown  SOCIAL HISTORY: N/A. smoking history unknown  REVIEW OF SYSTEMS:   Level V caveat  SUBJECTIVE:    VITAL SIGNS: BP (!) 183/119   Pulse (!) 56   Temp (!) 85.5 F (29.7 C)   Resp 17   Ht 6\' 1"  (1.854 m)   Wt 300 lb (136.1 kg)   SpO2 97%   BMI 39.58 kg/m   HEMODYNAMICS:    VENTILATOR SETTINGS: Vent Mode: PRVC FiO2 (%):  [40 %-50 %] 40 % Set Rate:  [18 bmp] 18 bmp Vt Set:  [500 mL] 500 mL PEEP:  [5 cmH20] 5 cmH20  INTAKE / OUTPUT: No intake/output data recorded.  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, symmetric pedal edema Skin:  No lesions noted  LABS:  BMET  Recent Labs Lab 06/21/17 1138  NA 144  K 3.9  CL 102  CO2 35*  BUN 16  CREATININE 0.53*  GLUCOSE 119*    Electrolytes  Recent Labs Lab 06/21/17 1138  CALCIUM 9.0    CBC  Recent Labs Lab 06/21/17 1138 06/21/17 1452  WBC 2.8* 2.9*  HGB 12.3* 11.1*  HCT 36.7* 33.1*  PLT 22* 19*    Coag's  Recent Labs Lab 06/21/17  1138  INR 1.10    Sepsis Markers  Recent Labs Lab 06/21/17 1138  LATICACIDVEN 1.5    ABG  Recent Labs Lab 06/21/17 1200  PHART 7.47*  PCO2ART 49*  PO2ART 75*    Liver Enzymes  Recent Labs Lab 06/21/17 1138  AST 38  ALT 39  ALKPHOS 92  BILITOT 0.5  ALBUMIN 3.0*    Cardiac Enzymes No results for input(s): TROPONINI, PROBNP in the last 168 hours.  Glucose  Recent Labs Lab 06/21/17 1137 06/21/17 1400  GLUCAP 113* 142*    CXR: Extensive infiltrate throughout on right. 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 Extensive left lung infiltrate - likely HCAP VDRF - intubated predominantly due to altered mental status Difficult airway per report P:   Vent settings established Vent bundle implemented Daily SBT as indicated  CARDIOVASCULAR A:  Sinus bradycardia - likely due to hypothermia and hypothyroidism P:  Telemetry monitoring Cycle cardiac markers If vasopressors required, will use dopamine  RENAL A:   Mild hypernatremia Mild chronic metabolic alkalosis - likely compensation for chronic alveolar hypoventilation P:   Monitor BMET intermittently Monitor I/Os Correct electrolytes as indicated   GASTROINTESTINAL A:   No acute issues P:   SUP: IV PPI Consider TF protocol 07/27  HEMATOLOGIC A:   Leukopenia Thrombocytopenia P:  DVT px: SCDs Monitor CBC intermittently Transfuse per usual guidelines Hematology consultation has been requested  INFECTIOUS A:   Severe sepsis HCAP P:   Monitor temp, WBC count Micro and abx as above  PCT protocol Strep and Legionella antigens ordered  ENDOCRINE A:   Hypothyroidism - TSH mildly elevated Hypothermia - likely due to sepsis P:   IV levothyroxine Warming blanket  NEUROLOGIC A:   Coma - likely due to acute illness and severe hypothermia P:   RASS goal: -1, -2 PAD protocol with intermittent fentanyl, midazolam   FAMILY: No family available   CCM time: 59 mins  Merton Border, MD PCCM service Mobile [phone] Pager [phone]  06/21/2017, 4:08 PM

## 2017-06-21 NOTE — ED Notes (Signed)
Back from CT

## 2017-06-21 NOTE — ED Notes (Signed)
Patient's temp per foley catheter: 83.2

## 2017-06-21 NOTE — ED Provider Notes (Addendum)
[facility] Emergency Department Provider Note  ____________________________________________   I have reviewed the triage vital signs and the nursing notes.   HISTORY  Chief Complaint Code Sepsis    HPI Jerry [name] is a 61 y.o. male  presents from a nursing home. He cannot give a history. Level 5 chart caveat; no further history available due to patient status. According to nursing home personnel EMS, patient was somewhat weak yesterday choking on his food and not doing as well as normal, and then this morning he was completely unresponsive with a low temperature and low heart rate. Patient is being treated with Macrobid for a urinary tract infection.  do not believe he could've taken an overdose. He was nonresponsive for EMS, bradycardic and with a low temperature. Normal sugars for them. Sugars here 113.    Past Medical History:  Diagnosis Date  . CHF (congestive heart failure) ([place])   . Hypertension   . Thyroid disease     There are no active problems to display for this patient.   No past surgical history on file.  Prior to Admission medications   Not on File    Allergies Methadone; Penicillins; Propoxyphene; and Sulfa antibiotics  No family history on file.  Social History Social History  Substance Use Topics  . Smoking status: Not on file  . Smokeless tobacco: Not on file  . Alcohol use Not on file    Review of Systems Level 5 chart caveat; no further history available due to patient status.   ____________________________________________   PHYSICAL EXAM:  VITAL SIGNS: ED Triage Vitals  Enc Vitals Group     BP 06/21/17 1149 115/76     Pulse Rate 06/21/17 1135 (!) 57     Resp 06/21/17 1135 14     Temp 06/21/17 1135 (!) 85 F (29.4 C)     Temp Source 06/21/17 1135 Rectal     SpO2 06/21/17 1135 99 %     Weight 06/21/17 1141 300 lb (136.1 kg)     Height --      Head Circumference --      [place] --      Pain Score --       Pain Loc --      Pain Edu? --      Excl. in [place]? --     Constitutional: Patient is nonresponsive even to painful stimuli breathing on his own pupils are pinpoint Eyes: Conjunctivae are normal pinpoint pupils noted Head: Atraumatic HEENT: No congestion/rhinnorhea. Mucous membranes are dry.  Oropharynx non-erythematous Neck:   Nontender with no meningismus, no masses, no stridor Cardiovascular: Bradycardia noted, regular rhythm. Grossly normal heart sounds.  Good peripheral circulation. Respiratory: Respiratory effort lungs diminished limited by obesity and patient cooperation with exam Abdominal: Soft and obese, no evidence of mass or tenderness nonsurgical Back:  Unremarkable to limited exam Musculoskeletal: Chronic appearing lower extremity vascular disease noted bilaterally. Neurologic:  Patient is not voluntarily moving initially. At one point he did move his right arm. Skin:  Skin is cold, dry and intact.    ____________________________________________   LABS (all labs ordered are listed, but only abnormal results are displayed)  Labs Reviewed  CULTURE, BLOOD (ROUTINE X 2)  CULTURE, BLOOD (ROUTINE X 2)  LACTIC ACID, PLASMA  PROTIME-INR  COMPREHENSIVE METABOLIC PANEL  LACTIC ACID, PLASMA  CBC WITH DIFFERENTIAL/PLATELET  URINALYSIS, COMPLETE (UACMP) WITH MICROSCOPIC  VALPROIC ACID LEVEL  BLOOD GAS, ARTERIAL  TSH   ____________________________________________  EKG  I personally interpreted any EKGs ordered by me or triage Sinus bradycardia no acute ischemic changes noted, possibly atypical L Beebee noted, rate 42 bpm. ____________________________________________  RADIOLOGY  I reviewed any imaging ordered by me or triage that were performed during my shift and, if possible, patient and/or family made aware of any abnormal findings. ____________________________________________   PROCEDURES  Procedure(s) performed: INTUBATION Performed by: Schuyler Amor  Required items: required blood products, implants, devices, and special equipment available Patient identity confirmed: provided demographic data and hospital-assigned identification number Time out: Immediately prior to procedure a "time out" was called to verify the correct patient, procedure, equipment, support staff and site/side marked as required.  Indications: Altered mental status   Intubation method: Plus Glidescope Laryngoscopy   Preoxygenation: Plus BVM  Sedatives: Positive Etomidate Paralytic: Rocuronium   Tube Size: 7.5 cuffed  Post-procedure assessment: chest rise and ETCO2 monitor Breath sounds: equal and absent over the epigastrium Tube secured with: ETT holder Chest x-ray interpreted by radiologist and me.  Chest x-ray findings: To initial read, endotracheal tube in appropriate position awaiting radiology confirmation   Patient tolerated the procedure well with no immediate complications.     Procedures  Critical Care performed: CRITICAL CARE Performed by: Schuyler Amor   Total critical care time: 115 minutes  Critical care time was exclusive of separately billable procedures and treating other patients.  Critical care was necessary to treat or prevent imminent or life-threatening deterioration.  Critical care was time spent personally by me on the following activities: development of treatment plan with patient and/or surrogate as well as nursing, discussions with consultants, evaluation of patient's response to treatment, examination of patient, obtaining history from patient or surrogate, ordering and performing treatments and interventions, ordering and review of laboratory studies, ordering and review of radiographic studies, pulse oximetry and re-evaluation of patient's condition.   ____________________________________________   INITIAL IMPRESSION / ASSESSMENT AND PLAN / ED COURSE  Pertinent labs & imaging results that were available  during my care of the patient were reviewed by me and considered in my medical decision making (see chart for details).   Patient came in on her specimen with a low core temperature low heart rate. History is of recent urinary tract infection. I did try Narcan as his pupils are pinpoint. After 4 of Narcan he had what I would call perhaps a very slight increase in his level of consciousness however he still was not awake he still is not following commands and is not protecting his airway. We initiated rewarming procedures immediately, 2 IVs were immediately established, we began warm IV fluid.  I emergently intubated the patient. Given his considerable body habitus, I did have a difficult airway cart present with a gum a gray G as needed. Patient was given etomidate and rocuronium as I do not know his potassium level. Fortunately, we were able to spit the patient without any difficulty. Differential for his presentation is quite broad, sepsis is certainly possible we're treating him with broad-spectrum antibiotics. He has a penicillin allergy that we do not know the nature of some using pen allergic medications. In addition, we are giving him warm IV fluids, we will obtain a CT scan of his head as a head bleed is certainly possible. His blood pressures in the 120s and 130s at this time, his heart rates in the 40s, I did try atropine with no 6S in raising his heart rate. His lungs's pressures told I will not be more aggressive  in that regard and I suspect this is secondary to his core temperature. We are aggressively rewarming him. We'll obtain a CT scan of the head.  ----------------------------------------- 1:17 PM on 06/21/2017 -----------------------------------------  Discussed with Dr. Jamal Collin. Patient does have from a cytopenia which apparently has been, according to care everywhere, progressive for several months. He has a reassuring CT scan, chest x-ray shows possible pneumonia, and his urinalysis  demonstrates urinary infection. I have discussed with Dr. Jamal Collin of ICU who agrees with management and does request a sputum culture which we will obtain. I am reassured by most of his blood work. Lactic acid etc. Heart rate is in the 40s now as he warms up, pressures are holding. I am continuing aggressive rewarming with warmed IV fluids and bear hugger. We'll discuss with hospitalist and they will admit   ----------------------------------------- 1:39 PM on 06/21/2017 -----------------------------------------  Platelet count noted, patient's last platelet count that is documented in his outpatient notes was 98,000 and there are any work came up apparently for thrombocytopenia at that time. That was from April. Does not appear that he has had it rechecked since that time to the extent that I can determine. This therefore can be gradually lowering platelet count. Obviously we cannot do MRI as the patient has a BB in his skull, and LP is also not indicated. Hospitalist agrees with this management and will admit. ITP is a constant duration. DIC is a consideration but given the rest of his findings I suspect probably lower on the differential. We are continued to warm him with IV warming fluids and blood pressures are in the 140s heart rate is in the mid to high 40s at this time and improving as he warms. ____________________________________________   FINAL CLINICAL IMPRESSION(S) / ED DIAGNOSES  Final diagnoses:  Endotracheal tube present  Encounter for nasogastric (NG) tube placement      This chart was dictated using voice recognition software.  Despite best efforts to proofread,  errors can occur which can change meaning.      Schuyler Amor, MD 06/21/17 1225    Schuyler Amor, MD 06/21/17 1232    Schuyler Amor, MD 06/21/17 1319    Schuyler Amor, MD 06/21/17 1341

## 2017-06-21 NOTE — ED Notes (Signed)
Patient given warmed fluids.

## 2017-06-21 NOTE — Progress Notes (Signed)
eLink Physician-Brief Progress Note Patient Name: Jerry [name] DOB: Feb 08, 1956 MRN: [id number]   Date of Service  06/21/2017  HPI/Events of Note  New patient admitted with sepsis secondary to pneumonia and acute hypoxic respiratory failure status post endotracheal intubation. Started on broad-spectrum antibiotic with vancomycin, Fortaz, and azithromycin. Has been evaluated by intensivist in consultation. Prophylaxis ordered with SCDs & Protonix. Currently with new thrombocytopenia and leukopenia likely due to sepsis and splenic sequestration. No known history of alcohol use. Question possible  ALPS.  eICU Interventions  Continuing plan of care as per admitting service and consultant intensivist.      Intervention Category Evaluation Type: New Patient Evaluation  Tera Partridge 06/21/2017, 4:28 PM

## 2017-06-21 NOTE — Progress Notes (Signed)
Dr Leonidas Romberg assessed patient. Temp 87.1 on bear hugger, heart rate in the mid 40's and is unresponsive at this point. No additional orders given at this time. POA [name] will be here tomorrow in the am. Unable to complete admission profile at this time due to patient being intubated.

## 2017-06-21 NOTE — ED Notes (Signed)
Patient's temp per foley catheter: 84.3

## 2017-06-21 NOTE — Progress Notes (Signed)
Notifed Bin[place] PA patient has had a total of 150 ml of urine since admission to ICU, which was about 16:00 today. Bladder scanned and 34 ml was noted. At this time no new orders given.

## 2017-06-21 NOTE — ED Triage Notes (Signed)
Pt to ed via [facility] from [facility] with reports of becoming less responsive this am low temp and heart rate in the 40's, pt with temp of 84.5 rectal on arrival, pin point pupils, nonverbal at this time. Pt is  A full code arriving with a pink most form.

## 2017-06-22 ENCOUNTER — Inpatient Hospital Stay: Payer: Medicaid Other

## 2017-06-22 DIAGNOSIS — Z978 Presence of other specified devices: Secondary | ICD-10-CM

## 2017-06-22 DIAGNOSIS — Z4659 Encounter for fitting and adjustment of other gastrointestinal appliance and device: Secondary | ICD-10-CM

## 2017-06-22 DIAGNOSIS — N390 Urinary tract infection, site not specified: Secondary | ICD-10-CM

## 2017-06-22 LAB — CBC
HEMATOCRIT: 32 % — AB (ref 40.0–52.0)
HEMOGLOBIN: 10.9 g/dL — AB (ref 13.0–18.0)
MCH: 30.9 pg (ref 26.0–34.0)
MCHC: 34.1 g/dL (ref 32.0–36.0)
MCV: 90.7 fL (ref 80.0–100.0)
Platelets: 18 10*3/uL — CL (ref 150–440)
RBC: 3.53 MIL/uL — AB (ref 4.40–5.90)
RDW: 17.7 % — ABNORMAL HIGH (ref 11.5–14.5)
WBC: 7.3 10*3/uL (ref 3.8–10.6)

## 2017-06-22 LAB — GLUCOSE, CAPILLARY
GLUCOSE-CAPILLARY: 66 mg/dL (ref 65–99)
Glucose-Capillary: 106 mg/dL — ABNORMAL HIGH (ref 65–99)
Glucose-Capillary: 86 mg/dL (ref 65–99)
Glucose-Capillary: 67 mg/dL (ref 65–99)
Glucose-Capillary: 99 mg/dL (ref 65–99)
Glucose-Capillary: 70 mg/dL (ref 65–99)
Glucose-Capillary: 124 mg/dL — ABNORMAL HIGH (ref 65–99)

## 2017-06-22 LAB — COMPREHENSIVE METABOLIC PANEL
ALT: 33 U/L (ref 17–63)
AST: 31 U/L (ref 15–41)
Albumin: 2.4 g/dL — ABNORMAL LOW (ref 3.5–5.0)
Alkaline Phosphatase: 71 U/L (ref 38–126)
Anion gap: 5 (ref 5–15)
BILIRUBIN TOTAL: 0.6 mg/dL (ref 0.3–1.2)
BUN: 17 mg/dL (ref 6–20)
CO2: 29 mmol/L (ref 22–32)
Calcium: 7.9 mg/dL — ABNORMAL LOW (ref 8.9–10.3)
Chloride: 109 mmol/L (ref 101–111)
Creatinine, Ser: 0.7 mg/dL (ref 0.61–1.24)
GFR calc Af Amer: >60 mL/min (ref >60)
Glucose, Bld: 67 mg/dL (ref 65–99)
POTASSIUM: 4 mmol/L (ref 3.5–5.1)
Sodium: 143 mmol/L (ref 135–145)
TOTAL PROTEIN: 6 g/dL — AB (ref 6.5–8.1)

## 2017-06-22 LAB — HEMOGLOBIN A1C
HEMOGLOBIN A1C: 4.9 % (ref 4.8–5.6)
MEAN PLASMA GLUCOSE: 94 mg/dL

## 2017-06-22 LAB — BLOOD CULTURE ID PANEL (REFLEXED)
Acinetobacter baumannii: NOT DETECTED
CANDIDA GLABRATA: NOT DETECTED
CANDIDA TROPICALIS: NOT DETECTED
Candida albicans: NOT DETECTED
Candida krusei: NOT DETECTED
Candida parapsilosis: NOT DETECTED
ENTEROBACTER CLOACAE COMPLEX: NOT DETECTED
Enterobacteriaceae species: NOT DETECTED
Enterococcus species: NOT DETECTED
Escherichia coli: NOT DETECTED
HAEMOPHILUS INFLUENZAE: NOT DETECTED
KLEBSIELLA PNEUMONIAE: NOT DETECTED
Klebsiella oxytoca: NOT DETECTED
Listeria monocytogenes: NOT DETECTED
Methicillin resistance: NOT DETECTED
NEISSERIA MENINGITIDIS: NOT DETECTED
PROTEUS SPECIES: NOT DETECTED
Pseudomonas aeruginosa: NOT DETECTED
SERRATIA MARCESCENS: NOT DETECTED
STAPHYLOCOCCUS AUREUS BCID: NOT DETECTED
STAPHYLOCOCCUS SPECIES: DETECTED — AB
STREPTOCOCCUS SPECIES: NOT DETECTED
Streptococcus agalactiae: NOT DETECTED
Streptococcus pneumoniae: NOT DETECTED
Streptococcus pyogenes: NOT DETECTED

## 2017-06-22 LAB — VITAMIN B12: Vitamin B-12: 1009 pg/mL — ABNORMAL HIGH (ref 180–914)

## 2017-06-22 LAB — VANCOMYCIN, TROUGH: VANCOMYCIN TR: 60 ug/mL — AB (ref 15–20)

## 2017-06-22 LAB — CORTISOL: Cortisol, Plasma: 11.2 ug/dL

## 2017-06-22 LAB — PROCALCITONIN

## 2017-06-22 LAB — PHOSPHORUS
PHOSPHORUS: 3 mg/dL (ref 2.5–4.6)
PHOSPHORUS: 3.2 mg/dL (ref 2.5–4.6)

## 2017-06-22 LAB — MAGNESIUM
MAGNESIUM: 1.5 mg/dL — AB (ref 1.7–2.4)
MAGNESIUM: 1.6 mg/dL — AB (ref 1.7–2.4)

## 2017-06-22 LAB — FIBRINOGEN: Fibrinogen: 505 mg/dL — ABNORMAL HIGH (ref 210–475)

## 2017-06-22 LAB — APTT: aPTT: 48 seconds — ABNORMAL HIGH (ref 24–36)

## 2017-06-22 LAB — TROPONIN I

## 2017-06-22 LAB — PATHOLOGIST SMEAR REVIEW

## 2017-06-22 LAB — HIV ANTIBODY (ROUTINE TESTING W REFLEX): HIV SCREEN 4TH GENERATION: NONREACTIVE

## 2017-06-22 LAB — FOLATE: Folate: 6.6 ng/mL (ref >5.9)

## 2017-06-22 LAB — STREP PNEUMONIAE URINARY ANTIGEN: Strep Pneumo Urinary Antigen: NEGATIVE

## 2017-06-22 MED ORDER — FENTANYL CITRATE (PF) 100 MCG/2ML IJ SOLN
100.0000 ug | INTRAMUSCULAR | Status: DC | PRN
  Administered 2017-06-22 (×2): 100 ug via INTRAVENOUS

## 2017-06-22 MED ORDER — FENTANYL CITRATE (PF) 100 MCG/2ML IJ SOLN: 100.0000 ug | INTRAMUSCULAR | Status: DC | PRN

## 2017-06-22 MED ORDER — PRO-STAT SUGAR FREE PO LIQD
60.0000 mL | Freq: Three times a day (TID) | ORAL | Status: DC
  Administered 2017-06-22 – 2017-06-25 (×11): 60 mL

## 2017-06-22 MED ORDER — DEXTROSE 5 % IV SOLN
0.0000 ug/min | INTRAVENOUS | Status: DC
  Administered 2017-06-22: 35 ug/min via INTRAVENOUS
  Administered 2017-06-22: 15 ug/min via INTRAVENOUS
  Administered 2017-06-23: 24 ug/min via INTRAVENOUS
  Administered 2017-06-23 – 2017-06-24 (×2): 20 ug/min via INTRAVENOUS
  Administered 2017-06-25: 8 ug/min via INTRAVENOUS
  Filled 2017-06-22 (×7): qty 16

## 2017-06-22 MED ORDER — INSULIN ASPART 100 UNIT/ML ~~LOC~~ SOLN: 2.0000 [IU] | SUBCUTANEOUS | Status: DC

## 2017-06-22 MED ORDER — VITAL HIGH PROTEIN PO LIQD
1000.0000 mL | ORAL | Status: DC
  Administered 2017-06-22 – 2017-06-23 (×3): 1000 mL
  Administered 2017-06-24 (×11)
  Administered 2017-06-24: 1000 mL
  Administered 2017-06-24 – 2017-06-25 (×3)
  Administered 2017-06-25: 1000 mL
  Administered 2017-06-25 – 2017-06-26 (×9)

## 2017-06-22 MED ORDER — CEFTAZIDIME AND DEXTROSE 2 GM/50ML IV SOLR
2.0000 g | Freq: Three times a day (TID) | INTRAVENOUS | Status: AC
  Administered 2017-06-22 – 2017-06-27 (×16): 2 g via INTRAVENOUS
  Filled 2017-06-22 (×17): qty 50

## 2017-06-22 MED ORDER — FENTANYL BOLUS VIA INFUSION: 50.0000 ug | INTRAVENOUS | Status: DC | PRN

## 2017-06-22 MED ORDER — SODIUM CHLORIDE 0.9 % IV BOLUS (SEPSIS)
500.0000 mL | Freq: Once | INTRAVENOUS | Status: AC
  Administered 2017-06-22: 500 mL via INTRAVENOUS

## 2017-06-22 MED ORDER — MAGNESIUM SULFATE 4 GM/100ML IV SOLN
4.0000 g | Freq: Once | INTRAVENOUS | Status: AC
  Administered 2017-06-22: 4 g via INTRAVENOUS
  Filled 2017-06-22: qty 100

## 2017-06-22 MED ORDER — DEXTROSE 50 % IV SOLN
12.5000 g | Freq: Once | INTRAVENOUS | Status: AC
  Administered 2017-06-22: 12.5 g via INTRAVENOUS
  Filled 2017-06-22: qty 50

## 2017-06-22 MED ORDER — FENTANYL CITRATE (PF) 100 MCG/2ML IJ SOLN: 50.0000 ug | Freq: Once | INTRAMUSCULAR | Status: DC

## 2017-06-22 MED ORDER — DEXTROSE 5 % IV SOLN
2.0000 g | Freq: Three times a day (TID) | INTRAVENOUS | Status: DC
  Filled 2017-06-22 (×3): qty 2

## 2017-06-22 MED ORDER — STERILE WATER FOR INJECTION IJ SOLN
INTRAMUSCULAR | Status: AC
  Administered 2017-06-22: 5 mL
  Filled 2017-06-22: qty 10

## 2017-06-22 MED ORDER — PANTOPRAZOLE SODIUM 40 MG PO TBEC
40.0000 mg | DELAYED_RELEASE_TABLET | Freq: Every day | ORAL | Status: DC
  Administered 2017-06-22 – 2017-06-24 (×3): 40 mg via ORAL
  Filled 2017-06-22 (×3): qty 1

## 2017-06-22 MED ORDER — FENTANYL 2500MCG IN NS 250ML (10MCG/ML) PREMIX INFUSION
0.0000 ug/h | INTRAVENOUS | Status: DC
  Administered 2017-06-22: 50 ug/h via INTRAVENOUS
  Administered 2017-06-23 – 2017-06-24 (×3): 100 ug/h via INTRAVENOUS
  Administered 2017-06-25: 150 ug/h via INTRAVENOUS
  Filled 2017-06-22 (×5): qty 250

## 2017-06-22 NOTE — Care Management (Signed)
Patient presented from [facility] where is is under a long term care plan.  Admitted with sepsis ? due to pneumonia-  and currently intubated, on pressors and unresponsive . Has a nephew that lives local and confirms that  niece [name] that is 72 who lives in [place] should be arriving today in [place].   CSW referral

## 2017-06-22 NOTE — Progress Notes (Signed)
[facility] Progess Note    SYNOPSIS   61 y.o. male with schizophrenia who was admitted through the emergency room with presumed sepsis from pneumonia.   ASSESSMENT/PLAN    PULMONARY A:Acute respiratory failure with pneumonia.  -ABG 7.47/49/75/35.7. Consistent with chronic hypercapnic respiratory failure, compensated. 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 P:   --Continue abx.  --Wean vent as tolerated.   VENTILATOR SETTINGS: Vent Mode: PRVC FiO2 (%):  [30 %-50 %] 30 % Set Rate:  [18 bmp] 18 bmp Vt Set:  [500 mL] 500 mL PEEP:  [5 cmH20] 5 cmH20 Plateau Pressure:  [15 cmH20-19 cmH20] 18 cmH20  CARDIOVASCULAR A: Septic shock with hypotension, currently on levophed at 18 mics. P:  -Wean down pressors as tolerated.  HEMODYNAMICS:    RENAL A:  Acute oliguric renal failure secondary to septic shock. P:   -Continue hydration, limit nephrotoxic medications. INTAKE / OUTPUT:  Intake/Output Summary (Last 24 hours) at 06/22/17 0922 Last data filed at 06/22/17 0600  Gross per 24 hour  Intake          6292.99 ml  Output              225 ml  Net          6067.99 ml    GASTROINTESTINAL A:  --Tube feeds, GI prophylaxis.  HEMATOLOGIC A:  Thrombocytopenia P:  --heme following, workup in progress.   INFECTIOUS A:  Pneumonia with septic shock. Continue empiric antibiotic coverage. MRSA screen positive.  P:   Check respiratory culture.  Micro/culture results:  MRSA PCR 07/26 >> , positive Urine 07/26 >> -- Resp 07/26 >> -- Blood 07/26 >>  negative to date Strep Ag 07/26 >>  Legionella Ag 07/26 >> --  Antibiotics: Vanc 07/26 >>  Azithro 07/26 >>  Ceftaz 07/26 >>   ENDOCRINE A:  Monitor glucose P:   SSI.   NEUROLOGIC/Psych A:  History of schizophrenia.  P:   -Sedation.  Resume psych meds when able.   MAJOR EVENTS/TEST RESULTS: 07/26 admission as above 07/26 CT head: Negative  Best Practices  DVT Prophylaxis: held due to low  platelets.  GI Prophylaxis: ppi.    ---------------------------------------   ----------------------------------------   Name: Jerry [name] MRN: [id number] DOB: Oct 23, 1956    ADMISSION DATE:  06/21/2017      SUBJECTIVE:   Pt currently on the ventilator, can not provide history or review of systems.   Review of Systems:  -   VITAL SIGNS: Temp:  [85 F (29.4 C)-98.1 F (36.7 C)] 97.5 F (36.4 C) (07/27 0600) Pulse Rate:  [35-96] 73 (07/27 0600) Resp:  [13-30] 18 (07/27 0600) BP: (56-183)/(36-119) 104/56 (07/27 0600) SpO2:  [97 %-100 %] 100 % (07/27 0738) FiO2 (%):  [30 %-50 %] 30 % (07/27 0738) Weight:  [300 lb (136.1 kg)-302 lb 14.6 oz (137.4 kg)] 302 lb 14.6 oz (137.4 kg) (07/27 0600)     PHYSICAL EXAMINATION: Physical Examination:   VS: BP (!) 104/56   Pulse 73   Temp (!) 97.5 F (36.4 C)   Resp 18   Ht 6\' 1"  (1.854 m)   Wt (!) 302 lb 14.6 oz (137.4 kg)   SpO2 100%   BMI 39.96 kg/m   General Appearance: No distress, on vent.  Neuro:without focal findings, mental status reduced.  HEENT: PERRLA, EOM intact. Pulmonary: normal breath sounds   CardiovascularNormal S1,S2.  No m/r/g.   Abdomen: Benign, Soft, non-tender. Renal:  No costovertebral tenderness  GU:  Not performed at  this time. Endocrine: No evident thyromegaly. Skin:   warm, no rashes, no ecchymosis  Extremities: normal, no cyanosis, clubbing.    LABORATORY PANEL:   CBC  Recent Labs Lab 06/22/17 0618  WBC 7.3  HGB 10.9*  HCT 32.0*  PLT 18*    Chemistries   Recent Labs Lab 06/22/17 0330  NA 143  K 4.0  CL 109  CO2 29  GLUCOSE 67  BUN 17  CREATININE 0.70  CALCIUM 7.9*  AST 31  ALT 33  ALKPHOS 71  BILITOT 0.6     Recent Labs Lab 06/21/17 1137 06/21/17 1400  GLUCAP 113* 142*    Recent Labs Lab 06/21/17 1200  PHART 7.47*  PCO2ART 49*  PO2ART 75*    Recent Labs Lab 06/21/17 1138 06/22/17 0330  AST 38 31  ALT 39 33  ALKPHOS 92 71  BILITOT 0.5 0.6    ALBUMIN 3.0* 2.4*    Cardiac Enzymes  Recent Labs Lab 06/22/17 0330  TROPONINI <0.03    RADIOLOGY:  Dg Abd 1 View  Result Date: 06/21/2017 CLINICAL DATA:  Possible sepsis. Status post orogastric tube placement. EXAM: ABDOMEN - 1 VIEW COMPARISON:  None in PACs FINDINGS: The patient has undergone placement of an esophagogastric tube whose tip and proximal port lie in the gastric body. There are loops of mildly distended gas-filled small bowel in the mid and lower abdomen. IMPRESSION: Successful placement of an esophagogastric tube with reasonable positioning of its tip. Electronically Signed   By: David  Martinique M.D.   On: 06/21/2017 12:23   Ct Head Wo Contrast  Result Date: 06/21/2017 CLINICAL DATA:  Unresponsive.  Possible stroke. EXAM: CT HEAD WITHOUT CONTRAST TECHNIQUE: Contiguous axial images were obtained from the base of the skull through the vertex without intravenous contrast. COMPARISON:  None. FINDINGS: Brain: There is no evidence of acute infarct, intracranial hemorrhage, mass, midline shift, or extra-axial fluid collection. There is mild cerebral atrophy, mildly prominent for age. Vascular: Calcified atherosclerosis at the skullbase. No hyperdense vessel. Skull: No fracture or suspicious osseous lesion. Retained metallic BB in the right parietal skull. Sinuses/Orbits: Moderate volume right sphenoid sinus fluid. Mild bilateral ethmoid air cell mucosal thickening. Trace bilateral mastoid fluid. Unremarkable orbits. Other: Endotracheal tube on topogram. IMPRESSION: No evidence of acute intracranial abnormality. Electronically Signed   By: Logan Bores M.D.   On: 06/21/2017 12:59   Dg Chest Port 1 View  Result Date: 06/22/2017 CLINICAL DATA:  Central line placement EXAM: PORTABLE CHEST 1 VIEW COMPARISON:  06/21/2017 at 11:54 FINDINGS: Knee right jugular central line extends to the low SVC. Endotracheal tube tip is 6.6 cm above the carina. Nasogastric tube extends into the stomach and  beyond the inferior edge of the image. Near complete opacification of the left hemithorax with volume loss. Ground-glass opacities in the right hemithorax. Right pleural effusion. No pneumothorax. IMPRESSION: 1. Satisfactorily positioned support equipment. 2. Near complete left hemithorax opacification. Small right pleural effusion. Electronically Signed   By: Andreas Newport M.D.   On: 06/22/2017 05:05   Dg Chest Port 1 View  Result Date: 06/21/2017 CLINICAL DATA:  Respiratory failure, intubated patient. History of CHF, hypertension, possible sepsis. EXAM: PORTABLE CHEST 1 VIEW COMPARISON:  Portable chest x-ray of October 11, 2012 FINDINGS: The lungs are hypoinflated. External pacemaker defibrillator pads are present. The interstitial markings of both lungs are increased greatest on the left. The cardiac silhouette is enlarged and indistinct. Small bilateral pleural effusions are suspected. The endotracheal tube tip lies approximately 1.5  cm above the carina. IMPRESSION: Limited study due to hypoinflation. Diffuse interstitial and early alveolar opacities predominantly on the left are worrisome for pneumonia. Underlying low-grade CHF is suspected. Electronically Signed   By: David  Martinique M.D.   On: 06/21/2017 12:22        --Marda Stalker, MD.  ICU Pager: [phone] [facility] Office Number: [phone]   06/22/2017   Critical Care Attestation.  I have personally obtained a history, examined the patient, evaluated laboratory and imaging results, formulated the assessment and plan and placed orders. The Patient requires high complexity decision making for assessment and support, frequent evaluation and titration of therapies, application of advanced monitoring technologies and extensive interpretation of multiple databases. The patient has critical illness that could lead imminently to failure of 1 or more organ systems and requires the highest level of  physician preparedness to intervene.  Critical Care Time devoted to patient care services described in this note is 45 minutes and is exclusive of time spent in procedures supervisory time of NP.

## 2017-06-22 NOTE — Plan of Care (Signed)
Results for ARMOND, CUTHRELL (MRN [id number]) as of 06/22/2017 16:16  Ref. Range 06/22/2017 15:51  Glucose-Capillary Latest Ref Range: 65 - 99 mg/dL 67    MD aware  Dextrose administered 12.5g

## 2017-06-22 NOTE — Progress Notes (Signed)
Pharmacy Antibiotic Note  Jerry [name] is a 61 y.o. male admitted on 06/21/2017 with Sepsis secondary to HCAP.  Pharmacy has been consulted for Vancomycin and ceftazidime dosing. Patient will also receive azithromycin  Patient received Vancomycin 1gm IV, Aztreonam 2g IV, and Levofloxacin 750mg  IV x 1 dose in ED.   There is a listed PCN allergy, which is reported to be Hives according to outpatient records.   Plan: VT resulted at 60 mcg/mL is not an accurate trough. Vancomycin 1250 mg was charted at 1742 and lab was drawn at 1831. Will retime VT to be drawn prior to next dose. Goal VT 15-20 mcg/mL Expect to see accumulation due to BMI of 40.    Continue  patient on ceftazidime 2g IV every 8 hours.   Height: 6\' 1"  (185.4 cm) Weight: (!) 302 lb 14.6 oz (137.4 kg) IBW/kg (Calculated) : 79.9  Temp (24hrs), Avg:95.9 F (35.5 C), Min:91.6 F (33.1 C), Max:98.5 F (36.9 C)   Recent Labs Lab 06/21/17 1138 06/21/17 1452 06/22/17 0330 06/22/17 0618 06/22/17 1831  WBC 2.8* 2.9*  --  7.3  --   CREATININE 0.53*  --  0.70  --   --   LATICACIDVEN 1.5  --   --   --   --   VANCOTROUGH  --   --   --   --  60*    Estimated Creatinine Clearance: 141.1 mL/min (by C-G formula based on SCr of 0.7 mg/dL).    Allergies  Allergen Reactions  . Methadone   . Penicillins     .Has patient had a PCN reaction causing immediate rash, facial/tongue/throat swelling, SOB or lightheadedness with hypotension: Unknown Has patient had a PCN reaction causing severe rash involving mucus membranes or skin necrosis: Unknown Has patient had a PCN reaction that required hospitalization: Unknown Has patient had a PCN reaction occurring within the last 10 years: Unknown If all of the above answers are "NO", then may proceed with Cephalosporin use.   Marland Kitchen Propoxyphene   . Sulfa Antibiotics     Antimicrobials this admission: 7/26 levofloxacin/Aztreonam x1 dose 7/26 vancomycin  >>  7/26 Ceftazidime>> 7/26  azithromycin>>    Dose adjustments this admission:  Microbiology results: 7/26  BCx: staph species, no mecA detected, GPC 7/26 Sputum/Respiratory: pending 7/26 MRSA PCR: positive  Thank you for allowing pharmacy to be a part of this patient's care.  Lenis Noon, PharmD, BCPS Clinical Pharmacist 06/22/2017 7:32 PM

## 2017-06-22 NOTE — Progress Notes (Signed)
Rn called about decreased co2 on monitor and alarming vent. Noted leak on arrival. Pts tube was 21 at lip. Advanced to 23 at lip.

## 2017-06-22 NOTE — Procedures (Signed)
Central Venous Catheter Insertion Procedure Note - Right Internal Jugular Jerry [name] [id number] 06-Mar-1956  Procedure: Insertion of Central Venous Catheter Indications: Assessment of intravascular volume, Drug and/or fluid administration and Frequent blood sampling  Procedure Details Consent: Unable to obtain consent because of emergent medical necessity. Time Out: Verified patient identification, verified procedure, site/side was marked, verified correct patient position, special equipment/implants available, medications/allergies/relevent history reviewed, required imaging and test results available.  Performed  Maximum sterile technique was used including antiseptics, cap, gloves, gown, hand hygiene, mask and sheet. Skin prep: Chlorhexidine; local anesthetic administered A antimicrobial bonded/coated triple lumen catheter was placed in the right internal jugular vein using the Seldinger technique.  Evaluation Blood flow good Complications: No apparent complications Patient did tolerate procedure well. Chest X-ray ordered to verify placement.  CXR: pending.      [facility]

## 2017-06-22 NOTE — Progress Notes (Signed)
[facility] NAME: Jerry [name]    MR#:  [id number]  DATE OF BIRTH:  1956/11/07  SUBJECTIVE:  CHIEF COMPLAINT:   Chief Complaint  Patient presents with  . Code Sepsis   -Noted to be unresponsive. Admitted to ICU for sepsis. -Remains on low-dose Levophed drip. Intubated on ventilator requiring sedation. -Remains critically ill  REVIEW OF SYSTEMS:  Review of Systems  Unable to perform ROS: Critical illness    DRUG ALLERGIES:   Allergies  Allergen Reactions  . Methadone   . Penicillins     .Has patient had a PCN reaction causing immediate rash, facial/tongue/throat swelling, SOB or lightheadedness with hypotension: Unknown Has patient had a PCN reaction causing severe rash involving mucus membranes or skin necrosis: Unknown Has patient had a PCN reaction that required hospitalization: Unknown Has patient had a PCN reaction occurring within the last 10 years: Unknown If all of the above answers are "NO", then may proceed with Cephalosporin use.   Marland Kitchen Propoxyphene   . Sulfa Antibiotics     VITALS:  Blood pressure (!) 104/56, pulse 73, temperature 98.5 F (36.9 C), temperature source Oral, resp. rate 18, height 6\' 1"  (1.854 m), weight (!) 137.4 kg (302 lb 14.6 oz), SpO2 100 %.  PHYSICAL EXAMINATION:  Physical Exam  GENERAL:  61 y.o.-year-old Obese patient lying in the bed, opening eyes and restless.  EYES: Pupils equal, round, reactive to light and accommodation. No scleral icterus. Extraocular muscles intact.  HEENT: Head atraumatic, normocephalic. Oropharynx and nasopharynx clear. Increased oral secretions noted. Has an OG tube and endotracheal tube in NECK:  Supple, no jugular venous distention. No thyroid enlargement, no tenderness.  LUNGS: Normal breath sounds bilaterally, no wheezing, rales,rhonchi or crepitation. No use of accessory muscles of respiration. Decreased bibasilar breath sounds CARDIOVASCULAR: S1, S2  normal. No murmurs, rubs, or gallops.  ABDOMEN: Soft, obese, nontender, nondistended. Bowel sounds present. No organomegaly or mass.  EXTREMITIES: No cyanosis, or clubbing. 1+ pedal edema, chronic hyperpigmentation of both feet but dorsalis pedis palpable bilaterally, gangrene appearing NEUROLOGIC: . Opening eyes, not following commands. Remains on sedation PSYCHIATRIC: The patient is alert but not following commands. Remains on sedation.  SKIN: No obvious rash, lesion, or ulcer.    LABORATORY PANEL:   CBC  Recent Labs Lab 06/22/17 0618  WBC 7.3  HGB 10.9*  HCT 32.0*  PLT 18*   ------------------------------------------------------------------------------------------------------------------  Chemistries   Recent Labs Lab 06/22/17 0330  NA 143  K 4.0  CL 109  CO2 29  GLUCOSE 67  BUN 17  CREATININE 0.70  CALCIUM 7.9*  AST 31  ALT 33  ALKPHOS 71  BILITOT 0.6   ------------------------------------------------------------------------------------------------------------------  Cardiac Enzymes  Recent Labs Lab 06/22/17 0330  TROPONINI <0.03   ------------------------------------------------------------------------------------------------------------------  RADIOLOGY:  Dg Abd 1 View  Result Date: 06/21/2017 CLINICAL DATA:  Possible sepsis. Status post orogastric tube placement. EXAM: ABDOMEN - 1 VIEW COMPARISON:  None in PACs FINDINGS: The patient has undergone placement of an esophagogastric tube whose tip and proximal port lie in the gastric body. There are loops of mildly distended gas-filled small bowel in the mid and lower abdomen. IMPRESSION: Successful placement of an esophagogastric tube with reasonable positioning of its tip. Electronically Signed   By: David  Martinique M.D.   On: 06/21/2017 12:23   Ct Head Wo Contrast  Result Date: 06/21/2017 CLINICAL DATA:  Unresponsive.  Possible stroke. EXAM: CT HEAD WITHOUT CONTRAST TECHNIQUE: Contiguous axial images  were  obtained from the base of the skull through the vertex without intravenous contrast. COMPARISON:  None. FINDINGS: Brain: There is no evidence of acute infarct, intracranial hemorrhage, mass, midline shift, or extra-axial fluid collection. There is mild cerebral atrophy, mildly prominent for age. Vascular: Calcified atherosclerosis at the skullbase. No hyperdense vessel. Skull: No fracture or suspicious osseous lesion. Retained metallic BB in the right parietal skull. Sinuses/Orbits: Moderate volume right sphenoid sinus fluid. Mild bilateral ethmoid air cell mucosal thickening. Trace bilateral mastoid fluid. Unremarkable orbits. Other: Endotracheal tube on topogram. IMPRESSION: No evidence of acute intracranial abnormality. Electronically Signed   By: Logan Bores M.D.   On: 06/21/2017 12:59   Dg Chest Port 1 View  Result Date: 06/22/2017 CLINICAL DATA:  Central line placement EXAM: PORTABLE CHEST 1 VIEW COMPARISON:  06/21/2017 at 11:54 FINDINGS: Knee right jugular central line extends to the low SVC. Endotracheal tube tip is 6.6 cm above the carina. Nasogastric tube extends into the stomach and beyond the inferior edge of the image. Near complete opacification of the left hemithorax with volume loss. Ground-glass opacities in the right hemithorax. Right pleural effusion. No pneumothorax. IMPRESSION: 1. Satisfactorily positioned support equipment. 2. Near complete left hemithorax opacification. Small right pleural effusion. Electronically Signed   By: Andreas Newport M.D.   On: 06/22/2017 05:05   Dg Chest Port 1 View  Result Date: 06/21/2017 CLINICAL DATA:  Respiratory failure, intubated patient. History of CHF, hypertension, possible sepsis. EXAM: PORTABLE CHEST 1 VIEW COMPARISON:  Portable chest x-ray of October 11, 2012 FINDINGS: The lungs are hypoinflated. External pacemaker defibrillator pads are present. The interstitial markings of both lungs are increased greatest on the left. The cardiac  silhouette is enlarged and indistinct. Small bilateral pleural effusions are suspected. The endotracheal tube tip lies approximately 1.5 cm above the carina. IMPRESSION: Limited study due to hypoinflation. Diffuse interstitial and early alveolar opacities predominantly on the left are worrisome for pneumonia. Underlying low-grade CHF is suspected. Electronically Signed   By: David  Martinique M.D.   On: 06/21/2017 12:22    EKG:   Orders placed or performed during the hospital encounter of 06/21/17  . EKG 12-Lead  . EKG 12-Lead    ASSESSMENT AND PLAN:   61 year old obese male with past medical history significant for schizophrenia, seizure disorder, hypothyroidism, rheumatoid arthritis from a nursing home was brought in secondary to unresponsive state, hypothermia.  #1 sepsis-secondary to healthcare acquired pneumonia. -Follow blood cultures. Remains on vancomycin and Fortaz -Blood pressure is borderline, requiring low-dose Levophed. -However procalcitonin is negative today - Management per ICU team -Since blood pressure is low, his blood pressure medications including Norvasc, Coreg, clonidine, torsemide are currently held  #2 acute respiratory failure-secondary to healthcare acquired pneumonia. Ventilator dependent respiratory failure at this time. -Management per ICU team. -Continue inhalers and IV antibiotics. -On low vent settings. Sedated with fentanyl. -Spontaneous breathing trial every day per protocol  #3 acute thrombocytopenia-likely sepsis related. -Appreciate oncology consult. Monitor closely. No indication for transfusion at this time.  #4 seizure disorder-continue Depakote  #5 schizophrenia-continue Prolixin. Trazodone, Klonopin are on hold.  #6 DVT prophylaxis- Ted's and SCDs for now, no heparin products due to thrombocytopenia     All the records are reviewed and case discussed with Care Management/Social Workerr. Management plans discussed with the patient, family  and they are in agreement.  CODE STATUS: Full code  TOTAL TIME TAKING CARE OF THIS PATIENT: 38 minutes.    Gladstone Lighter M.D on 06/22/2017 at 1:36 PM  Between 7am to 6pm - Pager - [phone]  After 6pm go to [email or web address] - password [email or web address] [facility]  Office  [phone]  CC: Primary care physician; No primary care provider on file.

## 2017-06-22 NOTE — Progress Notes (Addendum)
Initial Nutrition Assessment  DOCUMENTATION CODES:   Obesity unspecified  INTERVENTION:  Received verbal consult for CCM to begin tubefeeding Vital High Protein at 18mL/hr via OGT Pro-stat 69mL TID Regimen provides 1920 calories, 206 grams protein (99% estimated needs), 1129mL free water  NUTRITION DIAGNOSIS:   Inadequate oral intake related to inability to eat as evidenced by NPO status.  GOAL:   Provide needs based on ASPEN/SCCM guidelines  MONITOR:   I & O's, Labs, TF tolerance, Weight trends, Skin  REASON FOR ASSESSMENT:   Ventilator   ASSESSMENT:   61 yo male PMHHx Schizophrenia, HTN, BPH, Chronic Pain syndrome, bed bound, obesity, AKI, COPD, CHF was found unresponsive, bradycardic, hypothermic, intubated in ED with HCAP, severe sepsis, septic shock  No family at bedside to provide diet hx. OGT tip to stomach 6L fluid positive, 240mL UOP last 24 hrs Patient is currently intubated on ventilator support MV: 11.1 L/min Temp (24hrs), Avg:93.1 F (33.9 C), Min:85 F (29.4 C), Max:98.5 F (36.9 C) Propofol: none Nutrition-Focused physical exam completed. Findings are no fat depletion, no muscle depletion, and no edema.  Medications reviewed and include:  Miralax Fentanyl gtt, Levo gtt LR KCL mEq 20 at 18ml/hr, Versed PRN Labs reviewed:  CBGs 142, 113  Diet Order:  Diet NPO time specified  Skin:  Wound (see comment) (Stage 1 to left and right glute, to sacrum)  Last BM:  PTA  Height:   Ht Readings from Last 1 Encounters:  06/21/17 6\' 1"  (1.854 m)    Weight:   Wt Readings from Last 1 Encounters:  06/22/17 (!) 302 lb 14.6 oz (137.4 kg)    Ideal Body Weight:  83.63 kg   BMI:  Body mass index is 40.17 kg/m.  Estimated Nutritional Needs:   Kcal:  7544-9201 (11-14 cal/kg ABW)  Protein:  >/= 209 grams (2.5g/kg IBW)  Fluid:  Per MD/NP/PA  EDUCATION NEEDS:   Education needs no appropriate at this time  Jerry [name]. Jerry Nong, MS, RD LDN Inpatient  Clinical Dietitian Pager [phone]

## 2017-06-22 NOTE — Progress Notes (Signed)
Pharmacy Antibiotic Note  Jerry [name] is a 61 y.o. male admitted on 06/21/2017 with Sepsis secondary to HCAP.  Pharmacy has been consulted for Vancomycin and ceftazidime dosing. Patient will also receive azithromycin  Patient received Vancomycin 1gm IV, Aztreonam 2g IV, and Levofloxacin 750mg  IV x 1 dose in ED.   There is a listed PCN allergy, which is reported to be Hives according to outpatient records.   Plan: Ke: 0.104   Vd: 70   T1/2: 6.7 DW: 100kg   Continue  Vancomycin 1250mg  IV every 8 hours. Calculated trough at Css is 15. Expect to see accumulation due to BMI of 40. Trough ordered today  prior to 4th dose.   Continue  patient on ceftazidime 2g IV every 8 hours.   Height: 6\' 1"  (185.4 cm) Weight: (!) 302 lb 14.6 oz (137.4 kg) IBW/kg (Calculated) : 79.9  Temp (24hrs), Avg:92.9 F (33.8 C), Min:85 F (29.4 C), Max:98.5 F (36.9 C)   Recent Labs Lab 06/21/17 1138 06/21/17 1452 06/22/17 0330 06/22/17 0618  WBC 2.8* 2.9*  --  7.3  CREATININE 0.53*  --  0.70  --   LATICACIDVEN 1.5  --   --   --     Estimated Creatinine Clearance: 141.1 mL/min (by C-G formula based on SCr of 0.7 mg/dL).    Allergies  Allergen Reactions  . Methadone   . Penicillins     .Has patient had a PCN reaction causing immediate rash, facial/tongue/throat swelling, SOB or lightheadedness with hypotension: Unknown Has patient had a PCN reaction causing severe rash involving mucus membranes or skin necrosis: Unknown Has patient had a PCN reaction that required hospitalization: Unknown Has patient had a PCN reaction occurring within the last 10 years: Unknown If all of the above answers are "NO", then may proceed with Cephalosporin use.   Marland Kitchen Propoxyphene   . Sulfa Antibiotics     Antimicrobials this admission: 7/26 levofloxacin/Aztreonam x1 dose 7/26 vancomycin  >>  7/26 Ceftazidime>> 7/26 azithromycin>>    Dose adjustments this admission:  Microbiology results: 7/26  BCx: pending 7/26  Sputum/Respiratory: sent 7/26 MRSA PCR: sent  Thank you for allowing pharmacy to be a part of this patient's care.  Pernell Dupre, PharmD, BCPS Clinical Pharmacist 06/22/2017 10:53 AM

## 2017-06-22 NOTE — Progress Notes (Signed)
[facility] Progress Note Patient Name: [name] DOB: [birth year] MRN: [id number]   Date of Service  06/22/2017  HPI/Events of Note  Hypomag  eICU Interventions  Mag replaced     Intervention Category Intermediate Interventions: Electrolyte abnormality - evaluation and management  [name] 06/22/2017, 9:11 PM

## 2017-06-23 ENCOUNTER — Inpatient Hospital Stay: Payer: Medicaid Other

## 2017-06-23 LAB — CBC WITH DIFFERENTIAL/PLATELET
BASOS PCT: 0 %
Basophils Absolute: 0 10*3/uL (ref 0–0.1)
Eosinophils Absolute: 0.1 10*3/uL (ref 0–0.7)
Eosinophils Relative: 1 %
HEMATOCRIT: 30.1 % — AB (ref 40.0–52.0)
HEMOGLOBIN: 10 g/dL — AB (ref 13.0–18.0)
LYMPHS ABS: 1.3 10*3/uL (ref 1.0–3.6)
LYMPHS PCT: 24 %
MCH: 29.8 pg (ref 26.0–34.0)
MCHC: 33.1 g/dL (ref 32.0–36.0)
MCV: 89.9 fL (ref 80.0–100.0)
MONO ABS: 1 10*3/uL (ref 0.2–1.0)
MONOS PCT: 19 %
NEUTROS ABS: 2.9 10*3/uL (ref 1.4–6.5)
NEUTROS PCT: 56 %
Platelets: 17 10*3/uL — CL (ref 150–440)
RBC: 3.35 MIL/uL — ABNORMAL LOW (ref 4.40–5.90)
RDW: 17.7 % — AB (ref 11.5–14.5)
WBC: 5.2 10*3/uL (ref 3.8–10.6)

## 2017-06-23 LAB — BLOOD GAS, ARTERIAL
Acid-Base Excess: 7.3 mmol/L — ABNORMAL HIGH (ref 0.0–2.0)
Bicarbonate: 33.1 mmol/L — ABNORMAL HIGH (ref 20.0–28.0)
FIO2: 0.3
LHR: 18 {breaths}/min
O2 Saturation: 93.6 %
PCO2 ART: 51 mmHg — AB (ref 32.0–48.0)
PEEP: 5 cmH2O
Patient temperature: 37
VT: 500 mL
pH, Arterial: 7.42 (ref 7.350–7.450)
pO2, Arterial: 68 mmHg — ABNORMAL LOW (ref 83.0–108.0)

## 2017-06-23 LAB — PROCALCITONIN

## 2017-06-23 LAB — GLUCOSE, CAPILLARY
GLUCOSE-CAPILLARY: 75 mg/dL (ref 65–99)
GLUCOSE-CAPILLARY: 80 mg/dL (ref 65–99)
GLUCOSE-CAPILLARY: 81 mg/dL (ref 65–99)
GLUCOSE-CAPILLARY: 83 mg/dL (ref 65–99)
GLUCOSE-CAPILLARY: 92 mg/dL (ref 65–99)
GLUCOSE-CAPILLARY: 92 mg/dL (ref 65–99)
Glucose-Capillary: 88 mg/dL (ref 65–99)
Glucose-Capillary: 80 mg/dL (ref 65–99)

## 2017-06-23 LAB — COMPREHENSIVE METABOLIC PANEL
ALBUMIN: 2.1 g/dL — AB (ref 3.5–5.0)
ALK PHOS: 66 U/L (ref 38–126)
ALT: 25 U/L (ref 17–63)
ANION GAP: 4 — AB (ref 5–15)
AST: 21 U/L (ref 15–41)
BILIRUBIN TOTAL: 0.5 mg/dL (ref 0.3–1.2)
BUN: 18 mg/dL (ref 6–20)
CALCIUM: 8.2 mg/dL — AB (ref 8.9–10.3)
CO2: 30 mmol/L (ref 22–32)
Chloride: 108 mmol/L (ref 101–111)
Creatinine, Ser: 0.63 mg/dL (ref 0.61–1.24)
GFR calc Af Amer: >60 mL/min (ref >60)
GLUCOSE: 89 mg/dL (ref 65–99)
POTASSIUM: 3.7 mmol/L (ref 3.5–5.1)
Sodium: 142 mmol/L (ref 135–145)
TOTAL PROTEIN: 5.4 g/dL — AB (ref 6.5–8.1)

## 2017-06-23 LAB — ANA W/REFLEX: Anti Nuclear Antibody(ANA): NEGATIVE

## 2017-06-23 LAB — PHOSPHORUS
Phosphorus: 4.2 mg/dL (ref 2.5–4.6)
Phosphorus: 4.2 mg/dL (ref 2.5–4.6)

## 2017-06-23 LAB — AMMONIA: AMMONIA: 26 umol/L (ref 9–35)

## 2017-06-23 LAB — HEPATITIS B CORE ANTIBODY, TOTAL: Hep B Core Total Ab: NEGATIVE

## 2017-06-23 LAB — LACTIC ACID, PLASMA: Lactic Acid, Venous: 1 mmol/L (ref 0.5–1.9)

## 2017-06-23 LAB — VANCOMYCIN, TROUGH: VANCOMYCIN TR: 36 ug/mL — AB (ref 15–20)

## 2017-06-23 LAB — VANCOMYCIN, RANDOM: Vancomycin Rm: 33

## 2017-06-23 LAB — HEPATITIS C ANTIBODY: HCV Ab: 0.1 s/co ratio (ref 0.0–0.9)

## 2017-06-23 LAB — MAGNESIUM
Magnesium: 2.5 mg/dL — ABNORMAL HIGH (ref 1.7–2.4)
Magnesium: 2.2 mg/dL (ref 1.7–2.4)

## 2017-06-23 MED ORDER — VANCOMYCIN HCL IN DEXTROSE 1-5 GM/200ML-% IV SOLN
1000.0000 mg | INTRAVENOUS | Status: DC | PRN
  Filled 2017-06-23: qty 200

## 2017-06-23 MED ORDER — MIDAZOLAM HCL 2 MG/2ML IJ SOLN
2.0000 mg | Freq: Once | INTRAMUSCULAR | Status: AC
  Administered 2017-06-23: 2 mg via INTRAVENOUS

## 2017-06-23 MED ORDER — FENTANYL CITRATE (PF) 100 MCG/2ML IJ SOLN: 100.0000 ug | Freq: Once | INTRAMUSCULAR | Status: DC

## 2017-06-23 MED ORDER — FUROSEMIDE 10 MG/ML IJ SOLN
20.0000 mg | Freq: Three times a day (TID) | INTRAMUSCULAR | Status: AC
  Administered 2017-06-23 (×3): 20 mg via INTRAVENOUS
  Filled 2017-06-23 (×3): qty 2

## 2017-06-23 MED ORDER — SODIUM CHLORIDE 0.9% FLUSH: 10.0000 mL | INTRAVENOUS | Status: DC | PRN

## 2017-06-23 NOTE — Progress Notes (Signed)
Spoke with [facility] PA regarding patients heart rhythm and rate. She will place orders for labs, ABG, EKG. Bear hugger has been in place for patients temperature. Magnesium has also been replaced earlier in the shift. Patient has become agitated requiring increased dose of his fentanyl drip and PRN orders. Per Pharmacy will hold on giving patients vancomycin dose at 2:00am. Continue to monitor.

## 2017-06-23 NOTE — Progress Notes (Signed)
Sedation was held for 2 hrs this morning for WUA.  During WUA, pt was weaned to PSV for 1 hr. Opened eyes to noxious stimuli . Became very restless and easily agitated with nursing care. Flails  hands and arms toward ETT. Follows no commands. Currently, he is on fentanyl 138mcg, he has periods of agitation during nursing care  But otherwise restful. He received versed during bath and linen change. On prolixin. Temp 99.1. No episodes of bradycardia. NSR with 1st degree AVB. Lungs with rhonchi and tan blood tinge secretions. FIO2 weaned to 35%.  CXR with LLL improving infiltrate on fortaz, vanco and zithromax.  Good response to Lasix. UOP.  2L urine out this shift. Unable to wean off Levophed. Currently, levophed at 22 mcg.Tolerates TF. No stools. No family member called or here to visit today.

## 2017-06-23 NOTE — Progress Notes (Signed)
[facility] [place] NAME: Jerry [name]    MR#:  [id number]  DATE OF BIRTH:  12-27-1955  SUBJECTIVE:  CHIEF COMPLAINT:   Chief Complaint  Patient presents with  . Code Sepsis   -remains on a ventilator, sedated. He was anxious last night and fentanyl had to be increased. -Still on 45% FiO2 this morning Also remains on Levophed  REVIEW OF SYSTEMS:  Review of Systems  Unable to perform ROS: Critical illness    DRUG ALLERGIES:   Allergies  Allergen Reactions  . Methadone   . Penicillins     .Has patient had a PCN reaction causing immediate rash, facial/tongue/throat swelling, SOB or lightheadedness with hypotension: Unknown Has patient had a PCN reaction causing severe rash involving mucus membranes or skin necrosis: Unknown Has patient had a PCN reaction that required hospitalization: Unknown Has patient had a PCN reaction occurring within the last 10 years: Unknown If all of the above answers are "NO", then may proceed with Cephalosporin use.   Marland Kitchen Propoxyphene   . Sulfa Antibiotics     VITALS:  Blood pressure (!) 99/58, pulse 85, temperature 99.1 F (37.3 C), resp. rate 12, height 6\' 1"  (1.854 m), weight (!) 147.2 kg (324 lb 8.3 oz), SpO2 100 %.  PHYSICAL EXAMINATION:  Physical Exam  GENERAL:  61 y.o.-year-old Obese patient lying in the bed, sedated, not in any acute distress.  EYES: Pupils equal, round, reactive to light and accommodation. No scleral icterus. Extraocular muscles intact.  HEENT: Head atraumatic, normocephalic. Oropharynx and nasopharynx clear. Increased oral secretions noted. Has an OG tube and endotracheal tube in NECK:  Supple, no jugular venous distention. No thyroid enlargement, no tenderness.  LUNGS: Normal breath sounds bilaterally, no wheezing, rales or crepitation. No use of accessory muscles of respiration. Decreased bibasilar breath sounds with some rhonchi CARDIOVASCULAR: S1, S2 normal. No murmurs,  rubs, or gallops.  ABDOMEN: Soft, obese, nontender, nondistended. Bowel sounds present. No organomegaly or mass.  EXTREMITIES: No cyanosis, or clubbing. 1+ pedal edema, chronic hyperpigmentation of both feet but dorsalis pedis palpable bilaterally, gangrene appearing NEUROLOGIC: . Sedated this morning. Able to move all 4 extremities when sedation is decreased. Nonpurposeful movements, not following commands PSYCHIATRIC: The patient is on sedation.  SKIN: No obvious rash, lesion, or ulcer.    LABORATORY PANEL:   CBC  Recent Labs Lab 06/23/17 0113  WBC 5.2  HGB 10.0*  HCT 30.1*  PLT 17*   ------------------------------------------------------------------------------------------------------------------  Chemistries   Recent Labs Lab 06/23/17 0113 06/23/17 0422  NA 142  --   K 3.7  --   CL 108  --   CO2 30  --   GLUCOSE 89  --   BUN 18  --   CREATININE 0.63  --   CALCIUM 8.2*  --   MG  --  2.5*  AST 21  --   ALT 25  --   ALKPHOS 66  --   BILITOT 0.5  --    ------------------------------------------------------------------------------------------------------------------  Cardiac Enzymes  Recent Labs Lab 06/22/17 0330  TROPONINI <0.03   ------------------------------------------------------------------------------------------------------------------  RADIOLOGY:  Dg Chest 1 View  Result Date: 06/23/2017 CLINICAL DATA:  Dyspnea. EXAM: CHEST 1 VIEW COMPARISON:  06/22/2017 FINDINGS: Endotracheal tube terminates slightly above the thoracic inlet, approximately 8 cm above the carina. Endotracheal tube courses into the left upper abdomen. Right jugular catheter terminates near the cavoatrial junction. The cardiac silhouette is suboptimally evaluated due to surrounding airspace opacity and  leftward patient rotation. Lung volumes are diminished with pulmonary vascular congestion. There is some improved aeration of the left upper lobe. Dense consolidation remains in the left  lung base. Patchy right perihilar and right basilar opacities have mildly increased. Bilateral pleural effusions are suspected. No pneumothorax is identified. IMPRESSION: 1. Endotracheal tube terminates slightly above the thoracic inlet. Consider advancement. 2. Improved left upper lobe aeration. Persistent dense left basilar consolidation and mildly increased right lung airspace opacity. Electronically Signed   By: Logan Bores M.D.   On: 06/23/2017 07:07   Dg Abd 1 View  Result Date: 06/21/2017 CLINICAL DATA:  Possible sepsis. Status post orogastric tube placement. EXAM: ABDOMEN - 1 VIEW COMPARISON:  None in PACs FINDINGS: The patient has undergone placement of an esophagogastric tube whose tip and proximal port lie in the gastric body. There are loops of mildly distended gas-filled small bowel in the mid and lower abdomen. IMPRESSION: Successful placement of an esophagogastric tube with reasonable positioning of its tip. Electronically Signed   By: David  Martinique M.D.   On: 06/21/2017 12:23   Ct Head Wo Contrast  Result Date: 06/21/2017 CLINICAL DATA:  Unresponsive.  Possible stroke. EXAM: CT HEAD WITHOUT CONTRAST TECHNIQUE: Contiguous axial images were obtained from the base of the skull through the vertex without intravenous contrast. COMPARISON:  None. FINDINGS: Brain: There is no evidence of acute infarct, intracranial hemorrhage, mass, midline shift, or extra-axial fluid collection. There is mild cerebral atrophy, mildly prominent for age. Vascular: Calcified atherosclerosis at the skullbase. No hyperdense vessel. Skull: No fracture or suspicious osseous lesion. Retained metallic BB in the right parietal skull. Sinuses/Orbits: Moderate volume right sphenoid sinus fluid. Mild bilateral ethmoid air cell mucosal thickening. Trace bilateral mastoid fluid. Unremarkable orbits. Other: Endotracheal tube on topogram. IMPRESSION: No evidence of acute intracranial abnormality. Electronically Signed   By: Logan Bores M.D.   On: 06/21/2017 12:59   Dg Chest Port 1 View  Result Date: 06/22/2017 CLINICAL DATA:  Central line placement EXAM: PORTABLE CHEST 1 VIEW COMPARISON:  06/21/2017 at 11:54 FINDINGS: Knee right jugular central line extends to the low SVC. Endotracheal tube tip is 6.6 cm above the carina. Nasogastric tube extends into the stomach and beyond the inferior edge of the image. Near complete opacification of the left hemithorax with volume loss. Ground-glass opacities in the right hemithorax. Right pleural effusion. No pneumothorax. IMPRESSION: 1. Satisfactorily positioned support equipment. 2. Near complete left hemithorax opacification. Small right pleural effusion. Electronically Signed   By: Andreas Newport M.D.   On: 06/22/2017 05:05   Dg Chest Port 1 View  Result Date: 06/21/2017 CLINICAL DATA:  Respiratory failure, intubated patient. History of CHF, hypertension, possible sepsis. EXAM: PORTABLE CHEST 1 VIEW COMPARISON:  Portable chest x-ray of October 11, 2012 FINDINGS: The lungs are hypoinflated. External pacemaker defibrillator pads are present. The interstitial markings of both lungs are increased greatest on the left. The cardiac silhouette is enlarged and indistinct. Small bilateral pleural effusions are suspected. The endotracheal tube tip lies approximately 1.5 cm above the carina. IMPRESSION: Limited study due to hypoinflation. Diffuse interstitial and early alveolar opacities predominantly on the left are worrisome for pneumonia. Underlying low-grade CHF is suspected. Electronically Signed   By: David  Martinique M.D.   On: 06/21/2017 12:22    EKG:   Orders placed or performed during the hospital encounter of 06/21/17  . EKG 12-Lead  . EKG 12-Lead  . EKG 12-Lead  . EKG 12-Lead    ASSESSMENT AND PLAN:  61 year old obese male with past medical history significant for schizophrenia, seizure disorder, hypothyroidism, rheumatoid arthritis from a nursing home was brought in  secondary to unresponsive state, hypothermia.  #1 sepsis-secondary to healthcare acquired pneumonia. -negative blood cultures. Remains on vancomycin, azithromycin and Fortaz -Blood pressure is borderline, requiring Levophed. -However procalcitonin is negative  - Management per ICU team -Since blood pressure is low, his blood pressure medications including Norvasc, Coreg, clonidine, torsemide are currently held  #2 acute respiratory failure-secondary to healthcare acquired pneumonia. Ventilator dependent respiratory failure at this time. -Management per ICU team. -chest x-ray with left lower lobe infiltrate, ollow-up chest x-ray with some improvement noted -Continue inhalers and IV antibiotics. -On vent. Sedated with fentanyl. -Spontaneous breathing trial every day per protocol  #3 acute thrombocytopenia-likely sepsis related. -Appreciate oncology consult. Monitor closely. No indication for transfusion at this time.  #4 seizure disorder-continue Depakote  #5 schizophrenia-continue Prolixin. Trazodone, Klonopin are on hold.  #6 DVT prophylaxis- Ted's and SCDs for now, no heparin products due to thrombocytopenia     All the records are reviewed and case discussed with Care Management/Social Workerr. Management plans discussed with the patient, family and they are in agreement.  CODE STATUS: Full code  TOTAL TIME TAKING CARE OF THIS PATIENT: 37 minutes.    Gladstone Lighter M.D on 06/23/2017 at 9:03 AM  Between 7am to 6pm - Pager - [phone]  After 6pm go to [email or web address] - password [email or web address] [facility]  Office  [phone]  CC: Primary care physician; Patient, No Pcp Per

## 2017-06-23 NOTE — Therapy (Signed)
Patient weaned on PSV for 1 he 20 min. Tolerated well. Wean ended for patient care. Patient becomes very agitated with bathing, etc. Nurse needed to restart sedation for general care.

## 2017-06-23 NOTE — Progress Notes (Signed)
[facility] Hematology/Oncology Progress Note  Date of admission: 06/21/2017  Hospital day:  06/23/2017  Chief Complaint: Jerry [name] is a 61 y.o. male with schizophrenia, rheumatoid arthritis, and hypthyroidism who was admitted through the emergency room with presumed sepsis.  Subjective:  Patient intubated and sedated.  Social History: The patient is accompanied by his nurse today.  Allergies:  Allergies  Allergen Reactions  . Methadone   . Penicillins     .Has patient had a PCN reaction causing immediate rash, facial/tongue/throat swelling, SOB or lightheadedness with hypotension: Unknown Has patient had a PCN reaction causing severe rash involving mucus membranes or skin necrosis: Unknown Has patient had a PCN reaction that required hospitalization: Unknown Has patient had a PCN reaction occurring within the last 10 years: Unknown If all of the above answers are "NO", then may proceed with Cephalosporin use.   Marland Kitchen Propoxyphene   . Sulfa Antibiotics     Scheduled Medications: . albuterol  2.5 mg Nebulization Q6H  . chlorhexidine gluconate (MEDLINE KIT)  15 mL Mouth Rinse BID  . Chlorhexidine Gluconate Cloth  6 each Topical Q0600  . feeding supplement (PRO-STAT SUGAR FREE 64)  60 mL Per Tube TID  . feeding supplement (VITAL HIGH PROTEIN)  1,000 mL Per Tube Q24H  . fentaNYL (SUBLIMAZE) injection  100 mcg Intravenous Once  . fluPHENAZine  12.5 mg Oral Daily   And  . fluPHENAZine  15 mg Oral QHS  . furosemide  20 mg Intravenous Q8H  . insulin aspart  2-6 Units Subcutaneous Q4H  . levothyroxine  75 mcg Intravenous Daily  . mouth rinse  15 mL Mouth Rinse 10 times per day  . mupirocin ointment  1 application Nasal BID  . pantoprazole  40 mg Oral Q1200  . polyethylene glycol  17 g Per NG tube QHS    Review of Systems: Unable to obtain as patient intubated and sedated.  Physical Exam: Blood pressure (!) 88/47, pulse 86, temperature (!) 97 F (36.1 C),  resp. rate 13, height '6\' 1"'  (1.854 m), weight (!) 324 lb 8.3 oz (147.2 kg), SpO2 100 %.  GENERAL:  Well developed, well nourished,overweight gentleman lying comfortably in the ICU in no acute distress. MENTAL STATUS: Sedated.  Agitated on awakening. HEAD:  Normocephalic, atraumatic, face symmetric, no Cushingoid features. EYES:  Brown eyes.  Pupils equal.  No conjunctivitis or scleral icterus. ENT:  Orally intubated.  Tongue normal. Mucous membranes moist.  RESPIRATORY:  Clear to auscultation anteriorly without rales, wheezes or rhonchi. CARDIOVASCULAR:  Regular rate and rhythm without murmur, rub or gallop. ABDOMEN:  Soft, non-tender, with active bowel sounds, and no appreciable hepatosplenomegaly.  No masses. SKIN:  Grade I sacral pressure ulcer per nursing.   EXTREMITIES: Arthritic changes in hands. Bilateral foot drop.  No edema, no skin discoloration or tenderness.  No palpable cords. NEUROLOGICAL: Sedated.  On arousal, does not follow commands. PSYCH:  Sedated.  Results for orders placed or performed during the hospital encounter of 06/21/17 (from the past 48 hour(s))  Urinalysis, Complete w Microscopic     Status: Abnormal   Collection Time: 06/21/17 11:26 AM  Result Value Ref Range   Color, Urine AMBER (A) YELLOW    Comment: BIOCHEMICALS MAY BE AFFECTED BY COLOR   APPearance HAZY (A) CLEAR   Specific Gravity, Urine 1.025 1.005 - 1.030   pH 5.0 5.0 - 8.0   Glucose, UA NEGATIVE NEGATIVE mg/dL   Hgb urine dipstick LARGE (A) NEGATIVE   Bilirubin Urine  NEGATIVE NEGATIVE   Ketones, ur 5 (A) NEGATIVE mg/dL   Protein, ur 30 (A) NEGATIVE mg/dL   Nitrite NEGATIVE NEGATIVE   Leukocytes, UA LARGE (A) NEGATIVE   RBC / HPF TOO NUMEROUS TO COUNT 0 - 5 RBC/hpf   WBC, UA TOO NUMEROUS TO COUNT 0 - 5 WBC/hpf   Bacteria, UA NONE SEEN NONE SEEN   Squamous Epithelial / LPF 0-5 (A) NONE SEEN   WBC Clumps PRESENT    Mucous PRESENT    Hyaline Casts, UA PRESENT   Glucose, capillary     Status:  Abnormal   Collection Time: 06/21/17 11:37 AM  Result Value Ref Range   Glucose-Capillary 113 (H) 65 - 99 mg/dL  Comprehensive metabolic panel     Status: Abnormal   Collection Time: 06/21/17 11:38 AM  Result Value Ref Range   Sodium 144 135 - 145 mmol/L   Potassium 3.9 3.5 - 5.1 mmol/L   Chloride 102 101 - 111 mmol/L   CO2 35 (H) 22 - 32 mmol/L   Glucose, Bld 119 (H) 65 - 99 mg/dL   BUN 16 6 - 20 mg/dL   Creatinine, Ser 0.53 (L) 0.61 - 1.24 mg/dL   Calcium 9.0 8.9 - 10.3 mg/dL   Total Protein 7.1 6.5 - 8.1 g/dL   Albumin 3.0 (L) 3.5 - 5.0 g/dL   AST 38 15 - 41 U/L   ALT 39 17 - 63 U/L   Alkaline Phosphatase 92 38 - 126 U/L   Total Bilirubin 0.5 0.3 - 1.2 mg/dL   GFR calc non Af Amer >60 >60 mL/min   GFR calc Af Amer >60 >60 mL/min    Comment: (NOTE) The eGFR has been calculated using the CKD EPI equation. This calculation has not been validated in all clinical situations. eGFR's persistently <60 mL/min signify possible Chronic Kidney Disease.    Anion gap 7 5 - 15  Lactic acid, plasma     Status: None   Collection Time: 06/21/17 11:38 AM  Result Value Ref Range   Lactic Acid, Venous 1.5 0.5 - 1.9 mmol/L  CBC with Differential     Status: Abnormal   Collection Time: 06/21/17 11:38 AM  Result Value Ref Range   WBC 2.8 (L) 3.8 - 10.6 K/uL   RBC 4.11 (L) 4.40 - 5.90 MIL/uL   Hemoglobin 12.3 (L) 13.0 - 18.0 g/dL   HCT 36.7 (L) 40.0 - 52.0 %   MCV 89.3 80.0 - 100.0 fL   MCH 30.0 26.0 - 34.0 pg   MCHC 33.6 32.0 - 36.0 g/dL   RDW 17.3 (H) 11.5 - 14.5 %   Platelets 22 (LL) 150 - 440 K/uL    Comment: RESULT REPEATED AND VERIFIED PLATELET COUNT CONFIRMED BY SMEAR CRITICAL RESULT CALLED TO, READ BACK BY AND VERIFIED WITH: ANNA HOLT ON 06/21/17 AT 1247 QSD    Neutrophils Relative % 72 %   Neutro Abs 2.0 1.4 - 6.5 K/uL   Lymphocytes Relative 21 %   Lymphs Abs 0.6 (L) 1.0 - 3.6 K/uL   Monocytes Relative 6 %   Monocytes Absolute 0.2 0.2 - 1.0 K/uL   Eosinophils Relative 2 %    Eosinophils Absolute 0.0 0 - 0.7 K/uL   Basophils Relative 1 %   Basophils Absolute 0.0 0 - 0.1 K/uL   Smear Review MORPHOLOGY UNREMARKABLE     Comment: NO SCHISTOCYTES SEEN  Protime-INR     Status: None   Collection Time: 06/21/17 11:38 AM  Result Value  Ref Range   Prothrombin Time 14.2 11.4 - 15.2 seconds   INR 1.10   Culture, blood (Routine x 2)     Status: None (Preliminary result)   Collection Time: 06/21/17 11:38 AM  Result Value Ref Range   Specimen Description BLOOD BLOOD RIGHT ARM    Special Requests      BOTTLES DRAWN AEROBIC AND ANAEROBIC Blood Culture adequate volume   Culture NO GROWTH < 24 HOURS    Report Status PENDING   Culture, blood (Routine x 2)     Status: Abnormal (Preliminary result)   Collection Time: 06/21/17 11:38 AM  Result Value Ref Range   Specimen Description BLOOD BLOOD LEFT ARM    Special Requests      BOTTLES DRAWN AEROBIC AND ANAEROBIC Blood Culture results may not be optimal due to an excessive volume of blood received in culture bottles   Culture  Setup Time      [place] AND ANAEROBIC BOTTLES CRITICAL RESULT CALLED TO, READ BACK BY AND VERIFIED WITH:  HANK ZOMPA AT 6010 06/22/17 SDR GRAM STAIN REVIEWED-AGREE WITH RESULT    Culture (A)     STAPHYLOCOCCUS SPECIES (COAGULASE NEGATIVE) THE SIGNIFICANCE OF ISOLATING THIS ORGANISM FROM A SINGLE SET OF BLOOD CULTURES WHEN MULTIPLE SETS ARE DRAWN IS UNCERTAIN. PLEASE NOTIFY THE MICROBIOLOGY DEPARTMENT WITHIN ONE WEEK IF SPECIATION AND SENSITIVITIES ARE REQUIRED. Performed at [facility], [place] [address] [place], [place] [address]    Report Status PENDING   Valproic acid level     Status: Abnormal   Collection Time: 06/21/17 11:38 AM  Result Value Ref Range   Valproic Acid Lvl 39 (L) 50.0 - 100.0 ug/mL  TSH     Status: Abnormal   Collection Time: 06/21/17 11:38 AM  Result Value Ref Range   TSH 5.336 (H) 0.350 - 4.500 uIU/mL    Comment: Performed by a 3rd  Generation assay with a functional sensitivity of <=0.01 uIU/mL.  Acetaminophen level     Status: Abnormal   Collection Time: 06/21/17 11:38 AM  Result Value Ref Range   Acetaminophen (Tylenol), Serum <10 (L) 10 - 30 ug/mL    Comment:        THERAPEUTIC CONCENTRATIONS VARY SIGNIFICANTLY. A RANGE OF 10-30 ug/mL MAY BE AN EFFECTIVE CONCENTRATION FOR MANY PATIENTS. HOWEVER, SOME ARE BEST TREATED AT CONCENTRATIONS OUTSIDE THIS RANGE. ACETAMINOPHEN CONCENTRATIONS >150 ug/mL AT 4 HOURS AFTER INGESTION AND >50 ug/mL AT 12 HOURS AFTER INGESTION ARE OFTEN ASSOCIATED WITH TOXIC REACTIONS.   Ethanol     Status: None   Collection Time: 06/21/17 11:38 AM  Result Value Ref Range   Alcohol, Ethyl (B) <5 <5 mg/dL    Comment:        LOWEST DETECTABLE LIMIT FOR SERUM ALCOHOL IS 5 mg/dL FOR MEDICAL PURPOSES ONLY   Salicylate level     Status: None   Collection Time: 06/21/17 11:38 AM  Result Value Ref Range   Salicylate Lvl <5.7 2.8 - 30.0 mg/dL  Blood Culture ID Panel (Reflexed)     Status: Abnormal   Collection Time: 06/21/17 11:38 AM  Result Value Ref Range   Enterococcus species NOT DETECTED NOT DETECTED   Listeria monocytogenes NOT DETECTED NOT DETECTED   Staphylococcus species DETECTED (A) NOT DETECTED    Comment: Methicillin (oxacillin) susceptible coagulase negative staphylococcus. Possible blood culture contaminant (unless isolated from more than one blood culture draw or clinical case suggests pathogenicity). No antibiotic treatment is indicated for blood  culture contaminants. CRITICAL RESULT CALLED TO, READ BACK BY AND VERIFIED WITH:  HANK ZOMPA AT 8299 06/22/17 SDR    Staphylococcus aureus NOT DETECTED NOT DETECTED   Methicillin resistance NOT DETECTED NOT DETECTED   Streptococcus species NOT DETECTED NOT DETECTED   Streptococcus agalactiae NOT DETECTED NOT DETECTED   Streptococcus pneumoniae NOT DETECTED NOT DETECTED   Streptococcus pyogenes NOT DETECTED NOT DETECTED    Acinetobacter baumannii NOT DETECTED NOT DETECTED   Enterobacteriaceae species NOT DETECTED NOT DETECTED   Enterobacter cloacae complex NOT DETECTED NOT DETECTED   Escherichia coli NOT DETECTED NOT DETECTED   Klebsiella oxytoca NOT DETECTED NOT DETECTED   Klebsiella pneumoniae NOT DETECTED NOT DETECTED   Proteus species NOT DETECTED NOT DETECTED   Serratia marcescens NOT DETECTED NOT DETECTED   Haemophilus influenzae NOT DETECTED NOT DETECTED   Neisseria meningitidis NOT DETECTED NOT DETECTED   Pseudomonas aeruginosa NOT DETECTED NOT DETECTED   Candida albicans NOT DETECTED NOT DETECTED   Candida glabrata NOT DETECTED NOT DETECTED   Candida krusei NOT DETECTED NOT DETECTED   Candida parapsilosis NOT DETECTED NOT DETECTED   Candida tropicalis NOT DETECTED NOT DETECTED  Blood gas, arterial     Status: Abnormal   Collection Time: 06/21/17 12:00 PM  Result Value Ref Range   FIO2 50.00    Delivery systems VENTILATOR    Mode PRESSURE REGULATED VOLUME CONTROL    VT 500 mL   LHR 18 resp/min   Peep/cpap 5.0 cm H20   pH, Arterial 7.47 (H) 7.350 - 7.450   pCO2 arterial 49 (H) 32.0 - 48.0 mmHg   pO2, Arterial 75 (L) 83.0 - 108.0 mmHg   Bicarbonate 35.7 (H) 20.0 - 28.0 mmol/L   Acid-Base Excess 10.6 (H) 0.0 - 2.0 mmol/L   O2 Saturation 95.8 %   Patient temperature 37.0    Collection site RIGHT RADIAL    Sample type ARTERIAL DRAW    Allens test (pass/fail) PASS PASS  Glucose, capillary     Status: Abnormal   Collection Time: 06/21/17  2:00 PM  Result Value Ref Range   Glucose-Capillary 142 (H) 65 - 99 mg/dL  CBC WITH DIFFERENTIAL     Status: Abnormal   Collection Time: 06/21/17  2:52 PM  Result Value Ref Range   WBC 2.9 (L) 3.8 - 10.6 K/uL   RBC 3.73 (L) 4.40 - 5.90 MIL/uL   Hemoglobin 11.1 (L) 13.0 - 18.0 g/dL   HCT 33.1 (L) 40.0 - 52.0 %   MCV 88.8 80.0 - 100.0 fL   MCH 29.7 26.0 - 34.0 pg   MCHC 33.5 32.0 - 36.0 g/dL   RDW 16.8 (H) 11.5 - 14.5 %   Platelets 19 (LL) 150 - 440  K/uL    Comment: CRITICAL VALUE NOTED.  VALUE IS CONSISTENT WITH PREVIOUSLY REPORTED AND CALLED VALUE. REPEATED TO VERIFY    Neutrophils Relative % 73 %   Lymphocytes Relative 19 %   Monocytes Relative 7 %   Eosinophils Relative 1 %   Basophils Relative 0 %   Neutro Abs 2.1 1.4 - 6.5 K/uL   Lymphs Abs 0.6 (L) 1.0 - 3.6 K/uL   Monocytes Absolute 0.2 0.2 - 1.0 K/uL   Eosinophils Absolute 0.0 0 - 0.7 K/uL   Basophils Absolute 0.0 0 - 0.1 K/uL  Pathologist smear review     Status: None   Collection Time: 06/21/17  2:52 PM  Result Value Ref Range   Path Review      Peripheral  blood smear review shows normocytic anemia, leukopenia and thrombocytopenia. No RBC fragments seen. Discussed with Dr. Mike Gip at 515 PM on 06/21/17. Dr. Luana Shu.  Glucose, capillary     Status: Abnormal   Collection Time: 06/21/17  3:20 PM  Result Value Ref Range   Glucose-Capillary 106 (H) 65 - 99 mg/dL  MRSA PCR Screening     Status: Abnormal   Collection Time: 06/21/17  3:23 PM  Result Value Ref Range   MRSA by PCR POSITIVE (A) NEGATIVE    Comment:        The GeneXpert MRSA Assay (FDA approved for NASAL specimens only), is one component of a comprehensive MRSA colonization surveillance program. It is not intended to diagnose MRSA infection nor to guide or monitor treatment for MRSA infections. CRITICAL RESULT CALLED TO, READ BACK BY AND VERIFIED WITH: SANDRA VORVA AT 8242 ON 06/21/2017 JJB   Strep pneumoniae urinary antigen     Status: None   Collection Time: 06/21/17  4:17 PM  Result Value Ref Range   Strep Pneumo Urinary Antigen NEGATIVE NEGATIVE    Comment:        Infection due to S. pneumoniae cannot be absolutely ruled out since the antigen present may be below the detection limit of the test.   Urine Drug Screen, Qualitative ([facility] only)     Status: Abnormal   Collection Time: 06/21/17  4:17 PM  Result Value Ref Range   Tricyclic, Ur Screen POSITIVE (A) NONE DETECTED   Amphetamines, Ur  Screen NONE DETECTED NONE DETECTED   MDMA (Ecstasy)Ur Screen NONE DETECTED NONE DETECTED   Cocaine Metabolite,Ur [place] NONE DETECTED NONE DETECTED   Opiate, Ur Screen NONE DETECTED NONE DETECTED   Phencyclidine (PCP) Ur S NONE DETECTED NONE DETECTED   Cannabinoid 50 Ng, Ur [place] NONE DETECTED NONE DETECTED   Barbiturates, Ur Screen NONE DETECTED NONE DETECTED   Benzodiazepine, Ur Scrn NONE DETECTED NONE DETECTED   Methadone Scn, Ur NONE DETECTED NONE DETECTED    Comment: (NOTE) 353  Tricyclics, urine               Cutoff 1000 ng/mL 200  Amphetamines, urine             Cutoff 1000 ng/mL 300  MDMA (Ecstasy), urine           Cutoff 500 ng/mL 400  Cocaine Metabolite, urine       Cutoff 300 ng/mL 500  Opiate, urine                   Cutoff 300 ng/mL 600  Phencyclidine (PCP), urine      Cutoff 25 ng/mL 700  Cannabinoid, urine              Cutoff 50 ng/mL 800  Barbiturates, urine             Cutoff 200 ng/mL 900  Benzodiazepine, urine           Cutoff 200 ng/mL 1000 Methadone, urine                Cutoff 300 ng/mL 1100 1200 The urine drug screen provides only a preliminary, unconfirmed 1300 analytical test result and should not be used for non-medical 1400 purposes. Clinical consideration and professional judgment should 1500 be applied to any positive drug screen result due to possible 1600 interfering substances. A more specific alternate chemical method 1700 must be used in order to obtain a confirmed analytical result.  [place] /  mass spectrometry (GC/MS) is the preferred 1900 confirmatory method.   HIV antibody (Routine Testing)     Status: None   Collection Time: 06/21/17  4:20 PM  Result Value Ref Range   HIV Screen 4th Generation wRfx Non Reactive Non Reactive    Comment: (NOTE) Performed At: [facility] [place], [place] [id number] Lindon Romp MD [id number]   Troponin I     Status: None   Collection Time: 06/21/17  4:20 PM  Result  Value Ref Range   Troponin I <0.03 <0.03 ng/mL  Hemoglobin A1c     Status: None   Collection Time: 06/21/17  4:20 PM  Result Value Ref Range   Hgb A1c MFr Bld 4.9 4.8 - 5.6 %    Comment: (NOTE)         Pre-diabetes: 5.7 - 6.4         Diabetes: >6.4         Glycemic control for adults with diabetes: <7.0    Mean Plasma Glucose 94 mg/dL    Comment: (NOTE) Performed At: [facility] [place], [place] [id number] Lindon Romp MD QI:[id number]   Procalcitonin - Baseline     Status: None   Collection Time: 06/21/17  4:20 PM  Result Value Ref Range   Procalcitonin <0.10 ng/mL    Comment:        Interpretation: PCT (Procalcitonin) <= 0.5 ng/mL: Systemic infection (sepsis) is not likely. Local bacterial infection is possible. (NOTE)         ICU PCT Algorithm               Non ICU PCT Algorithm    ----------------------------     ------------------------------         PCT < 0.25 ng/mL                 PCT < 0.1 ng/mL     Stopping of antibiotics            Stopping of antibiotics       strongly encouraged.               strongly encouraged.    ----------------------------     ------------------------------       PCT level decrease by               PCT < 0.25 ng/mL       >= 80% from peak PCT       OR PCT 0.25 - 0.5 ng/mL          Stopping of antibiotics                                             encouraged.     Stopping of antibiotics           encouraged.    ----------------------------     ------------------------------       PCT level decrease by              PCT >= 0.25 ng/mL       < 80% from peak PCT        AND PCT >= 0.5 ng/mL            Continuin g antibiotics  encouraged.       Continuing antibiotics            encouraged.    ----------------------------     ------------------------------     PCT level increase compared          PCT > 0.5 ng/mL         with peak PCT AND          PCT >= 0.5 ng/mL              Escalation of antibiotics                                          strongly encouraged.      Escalation of antibiotics        strongly encouraged. Performed at [facility], [address], [place], [place] [address]   Troponin I     Status: None   Collection Time: 06/21/17  9:28 PM  Result Value Ref Range   Troponin I <0.03 <0.03 ng/mL  Cortisol     Status: None   Collection Time: 06/21/17 11:28 PM  Result Value Ref Range   Cortisol, Plasma 11.2 ug/dL    Comment: (NOTE) AM    6.7 - 22.6 ug/dL PM   <10.0       ug/dL Performed at [facility] [address], [place], [place] [address]   Troponin I     Status: None   Collection Time: 06/22/17  3:30 AM  Result Value Ref Range   Troponin I <0.03 <0.03 ng/mL  Comprehensive metabolic panel     Status: Abnormal   Collection Time: 06/22/17  3:30 AM  Result Value Ref Range   Sodium 143 135 - 145 mmol/L   Potassium 4.0 3.5 - 5.1 mmol/L   Chloride 109 101 - 111 mmol/L   CO2 29 22 - 32 mmol/L   Glucose, Bld 67 65 - 99 mg/dL   BUN 17 6 - 20 mg/dL   Creatinine, Ser 0.70 0.61 - 1.24 mg/dL   Calcium 7.9 (L) 8.9 - 10.3 mg/dL   Total Protein 6.0 (L) 6.5 - 8.1 g/dL   Albumin 2.4 (L) 3.5 - 5.0 g/dL   AST 31 15 - 41 U/L   ALT 33 17 - 63 U/L   Alkaline Phosphatase 71 38 - 126 U/L   Total Bilirubin 0.6 0.3 - 1.2 mg/dL   GFR calc non Af Amer >60 >60 mL/min   GFR calc Af Amer >60 >60 mL/min    Comment: (NOTE) The eGFR has been calculated using the CKD EPI equation. This calculation has not been validated in all clinical situations. eGFR's persistently <60 mL/min signify possible Chronic Kidney Disease.    Anion gap 5 5 - 15  Procalcitonin     Status: None   Collection Time: 06/22/17  3:30 AM  Result Value Ref Range   Procalcitonin <0.10 ng/mL    Comment:        Interpretation: PCT (Procalcitonin) <= 0.5 ng/mL: Systemic infection (sepsis) is not likely. Local bacterial infection is possible. (NOTE)         ICU PCT  Algorithm               Non ICU PCT Algorithm    ----------------------------     ------------------------------         PCT < 0.25 ng/mL  PCT < 0.1 ng/mL     Stopping of antibiotics            Stopping of antibiotics       strongly encouraged.               strongly encouraged.    ----------------------------     ------------------------------       PCT level decrease by               PCT < 0.25 ng/mL       >= 80% from peak PCT       OR PCT 0.25 - 0.5 ng/mL          Stopping of antibiotics                                             encouraged.     Stopping of antibiotics           encouraged.    ----------------------------     ------------------------------       PCT level decrease by              PCT >= 0.25 ng/mL       < 80% from peak PCT        AND PCT >= 0.5 ng/mL            Continuin g antibiotics                                              encouraged.       Continuing antibiotics            encouraged.    ----------------------------     ------------------------------     PCT level increase compared          PCT > 0.5 ng/mL         with peak PCT AND          PCT >= 0.5 ng/mL             Escalation of antibiotics                                          strongly encouraged.      Escalation of antibiotics        strongly encouraged.   APTT     Status: Abnormal   Collection Time: 06/22/17  3:30 AM  Result Value Ref Range   aPTT 48 (H) 24 - 36 seconds    Comment:        IF BASELINE aPTT IS ELEVATED, SUGGEST PATIENT RISK ASSESSMENT BE USED TO DETERMINE APPROPRIATE ANTICOAGULANT THERAPY.   Fibrinogen     Status: Abnormal   Collection Time: 06/22/17  3:30 AM  Result Value Ref Range   Fibrinogen 505 (H) 210 - 475 mg/dL  Hepatitis B core antibody, total     Status: None   Collection Time: 06/22/17  3:30 AM  Result Value Ref Range   Hep B Core Total Ab Negative Negative    Comment: (NOTE) Performed At: [facility] [place], [place]  [id number] Lindon Romp MD [id number]  Hepatitis C antibody     Status: None   Collection Time: 06/22/17  3:30 AM  Result Value Ref Range   HCV Ab 0.1 0.0 - 0.9 s/co ratio    Comment: (NOTE)                                  Negative:     < 0.8                             Indeterminate: 0.8 - 0.9                                  Positive:     > 0.9 The CDC recommends that a positive HCV antibody result be followed up with a HCV Nucleic Acid Amplification test (161096). Performed At: [facility] [address] [place], [place] [id number] Lindon Romp MD [id number]   Vitamin B12     Status: Abnormal   Collection Time: 06/22/17  3:30 AM  Result Value Ref Range   Vitamin B-12 1,009 (H) 180 - 914 pg/mL    Comment: (NOTE) This assay is not validated for testing neonatal or myeloproliferative syndrome specimens for Vitamin B12 levels. Performed at [facility], [place] [address], [place], [place] [address]   Folate     Status: None   Collection Time: 06/22/17  3:30 AM  Result Value Ref Range   Folate 6.6 >5.9 ng/mL  CBC     Status: Abnormal   Collection Time: 06/22/17  6:18 AM  Result Value Ref Range   WBC 7.3 3.8 - 10.6 K/uL   RBC 3.53 (L) 4.40 - 5.90 MIL/uL   Hemoglobin 10.9 (L) 13.0 - 18.0 g/dL   HCT 32.0 (L) 40.0 - 52.0 %   MCV 90.7 80.0 - 100.0 fL   MCH 30.9 26.0 - 34.0 pg   MCHC 34.1 32.0 - 36.0 g/dL   RDW 17.7 (H) 11.5 - 14.5 %   Platelets 18 (LL) 150 - 440 K/uL    Comment: PLATELET COUNT CONFIRMED BY SMEAR CRITICAL VALUE NOTED.  VALUE IS CONSISTENT WITH PREVIOUSLY REPORTED AND CALLED VALUE.   Glucose, capillary     Status: None   Collection Time: 06/22/17 11:53 AM  Result Value Ref Range   Glucose-Capillary 86 65 - 99 mg/dL  Culture, respiratory (NON-Expectorated)     Status: None (Preliminary result)   Collection Time: 06/22/17 11:55 AM  Result Value Ref Range   Specimen Description TRACHEAL ASPIRATE    Special Requests NONE    Gram  Stain      ABUNDANT WBC PRESENT,BOTH PMN AND MONONUCLEAR NO ORGANISMS SEEN    Culture      NO GROWTH 1 DAY Performed at [facility], [place] [address] [place] [address], [place], [place] [address]    Report Status PENDING   Magnesium     Status: Abnormal   Collection Time: 06/22/17  1:27 PM  Result Value Ref Range   Magnesium 1.5 (L) 1.7 - 2.4 mg/dL  Phosphorus     Status: None   Collection Time: 06/22/17  1:27 PM  Result Value Ref Range   Phosphorus 3.0 2.5 - 4.6 mg/dL  Glucose, capillary     Status: None   Collection Time: 06/22/17  3:51 PM  Result Value Ref Range  Glucose-Capillary 67 65 - 99 mg/dL  Glucose, capillary     Status: None   Collection Time: 06/22/17  4:29 PM  Result Value Ref Range   Glucose-Capillary 99 65 - 99 mg/dL  Vancomycin, trough     Status: Abnormal   Collection Time: 06/22/17  6:31 PM  Result Value Ref Range   Vancomycin Tr 60 (HH) 15 - 20 ug/mL    Comment: CRITICAL RESULT CALLED TO, READ BACK BY AND VERIFIED WITH JASON ROBBINS AT 1912 06/22/17.PMH  Magnesium     Status: Abnormal   Collection Time: 06/22/17  6:31 PM  Result Value Ref Range   Magnesium 1.6 (L) 1.7 - 2.4 mg/dL  Phosphorus     Status: None   Collection Time: 06/22/17  6:31 PM  Result Value Ref Range   Phosphorus 3.2 2.5 - 4.6 mg/dL  Glucose, capillary     Status: None   Collection Time: 06/22/17  7:45 PM  Result Value Ref Range   Glucose-Capillary 66 65 - 99 mg/dL  Glucose, capillary     Status: None   Collection Time: 06/22/17  7:48 PM  Result Value Ref Range   Glucose-Capillary 70 65 - 99 mg/dL  Glucose, capillary     Status: Abnormal   Collection Time: 06/22/17  8:43 PM  Result Value Ref Range   Glucose-Capillary 124 (H) 65 - 99 mg/dL  Glucose, capillary     Status: None   Collection Time: 06/22/17 11:59 PM  Result Value Ref Range   Glucose-Capillary 83 65 - 99 mg/dL  Vancomycin, trough     Status: Abnormal   Collection Time: 06/23/17 12:10 AM  Result Value Ref Range   Vancomycin  Tr 36 (HH) 15 - 20 ug/mL    Comment: CRITICAL RESULT CALLED TO, READ BACK BY AND VERIFIED WITH MATT MCBANE AT 5284 06/23/17.PMH  Glucose, capillary     Status: None   Collection Time: 06/23/17 12:10 AM  Result Value Ref Range   Glucose-Capillary 80 65 - 99 mg/dL  Blood gas, arterial     Status: Abnormal   Collection Time: 06/23/17  1:10 AM  Result Value Ref Range   FIO2 0.30    Delivery systems VENTILATOR    Mode PRESSURE REGULATED VOLUME CONTROL    VT 500 mL   LHR 18 resp/min   Peep/cpap 5.0 cm H20   pH, Arterial 7.42 7.350 - 7.450   pCO2 arterial 51 (H) 32.0 - 48.0 mmHg   pO2, Arterial 68 (L) 83.0 - 108.0 mmHg   Bicarbonate 33.1 (H) 20.0 - 28.0 mmol/L   Acid-Base Excess 7.3 (H) 0.0 - 2.0 mmol/L   O2 Saturation 93.6 %   Patient temperature 37.0    Collection site RIGHT RADIAL    Sample type ARTERIAL DRAW    Allens test (pass/fail) PASS PASS  CBC with Differential/Platelet     Status: Abnormal   Collection Time: 06/23/17  1:13 AM  Result Value Ref Range   WBC 5.2 3.8 - 10.6 K/uL   RBC 3.35 (L) 4.40 - 5.90 MIL/uL   Hemoglobin 10.0 (L) 13.0 - 18.0 g/dL   HCT 30.1 (L) 40.0 - 52.0 %   MCV 89.9 80.0 - 100.0 fL   MCH 29.8 26.0 - 34.0 pg   MCHC 33.1 32.0 - 36.0 g/dL   RDW 17.7 (H) 11.5 - 14.5 %   Platelets 17 (LL) 150 - 440 K/uL    Comment: RESULT REPEATED AND VERIFIED PLATELET COUNT CONFIRMED BY SMEAR CRITICAL VALUE NOTED.  VALUE IS CONSISTENT WITH PREVIOUSLY REPORTED AND CALLED VALUE.    Neutrophils Relative % 56 %   Neutro Abs 2.9 1.4 - 6.5 K/uL   Lymphocytes Relative 24 %   Lymphs Abs 1.3 1.0 - 3.6 K/uL   Monocytes Relative 19 %   Monocytes Absolute 1.0 0.2 - 1.0 K/uL   Eosinophils Relative 1 %   Eosinophils Absolute 0.1 0 - 0.7 K/uL   Basophils Relative 0 %   Basophils Absolute 0.0 0 - 0.1 K/uL  Comprehensive metabolic panel     Status: Abnormal   Collection Time: 06/23/17  1:13 AM  Result Value Ref Range   Sodium 142 135 - 145 mmol/L   Potassium 3.7 3.5 - 5.1  mmol/L   Chloride 108 101 - 111 mmol/L   CO2 30 22 - 32 mmol/L   Glucose, Bld 89 65 - 99 mg/dL   BUN 18 6 - 20 mg/dL   Creatinine, Ser 0.63 0.61 - 1.24 mg/dL   Calcium 8.2 (L) 8.9 - 10.3 mg/dL   Total Protein 5.4 (L) 6.5 - 8.1 g/dL   Albumin 2.1 (L) 3.5 - 5.0 g/dL   AST 21 15 - 41 U/L   ALT 25 17 - 63 U/L   Alkaline Phosphatase 66 38 - 126 U/L   Total Bilirubin 0.5 0.3 - 1.2 mg/dL   GFR calc non Af Amer >60 >60 mL/min   GFR calc Af Amer >60 >60 mL/min    Comment: (NOTE) The eGFR has been calculated using the CKD EPI equation. This calculation has not been validated in all clinical situations. eGFR's persistently <60 mL/min signify possible Chronic Kidney Disease.    Anion gap 4 (L) 5 - 15  Procalcitonin     Status: None   Collection Time: 06/23/17  1:13 AM  Result Value Ref Range   Procalcitonin <0.10 ng/mL    Comment:        Interpretation: PCT (Procalcitonin) <= 0.5 ng/mL: Systemic infection (sepsis) is not likely. Local bacterial infection is possible. (NOTE)         ICU PCT Algorithm               Non ICU PCT Algorithm    ----------------------------     ------------------------------         PCT < 0.25 ng/mL                 PCT < 0.1 ng/mL     Stopping of antibiotics            Stopping of antibiotics       strongly encouraged.               strongly encouraged.    ----------------------------     ------------------------------       PCT level decrease by               PCT < 0.25 ng/mL       >= 80% from [facility] PCT       OR PCT 0.25 - 0.5 ng/mL          Stopping of antibiotics                                             encouraged.     Stopping of antibiotics           encouraged.    ----------------------------     ------------------------------  PCT level decrease by              PCT >= 0.25 ng/mL       < 80% from [facility] PCT        AND PCT >= 0.5 ng/mL            Continuin g antibiotics                                              encouraged.       Continuing  antibiotics            encouraged.    ----------------------------     ------------------------------     PCT level increase compared          PCT > 0.5 ng/mL         with [facility] PCT AND          PCT >= 0.5 ng/mL             Escalation of antibiotics                                          strongly encouraged.      Escalation of antibiotics        strongly encouraged.   Ammonia     Status: None   Collection Time: 06/23/17  1:13 AM  Result Value Ref Range   Ammonia 26 9 - 35 umol/L  Lactic acid, plasma     Status: None   Collection Time: 06/23/17  1:55 AM  Result Value Ref Range   Lactic Acid, Venous 1.0 0.5 - 1.9 mmol/L  Glucose, capillary     Status: None   Collection Time: 06/23/17  3:43 AM  Result Value Ref Range   Glucose-Capillary 88 65 - 99 mg/dL   Comment 1 Notify RN    Comment 2 Document in Chart   Magnesium     Status: Abnormal   Collection Time: 06/23/17  4:22 AM  Result Value Ref Range   Magnesium 2.5 (H) 1.7 - 2.4 mg/dL  Phosphorus     Status: None   Collection Time: 06/23/17  4:22 AM  Result Value Ref Range   Phosphorus 4.2 2.5 - 4.6 mg/dL  Vancomycin, random     Status: None   Collection Time: 06/23/17  4:22 AM  Result Value Ref Range   Vancomycin Rm 33     Comment:        Random Vancomycin therapeutic range is dependent on dosage and time of specimen collection. A peak range is 20.0-40.0 ug/mL A trough range is 5.0-15.0 ug/mL          Glucose, capillary     Status: None   Collection Time: 06/23/17  7:41 AM  Result Value Ref Range   Glucose-Capillary 92 65 - 99 mg/dL   Dg Chest 1 View  Result Date: 06/23/2017 CLINICAL DATA:  Dyspnea. EXAM: CHEST 1 VIEW COMPARISON:  06/22/2017 FINDINGS: Endotracheal tube terminates slightly above the thoracic inlet, approximately 8 cm above the carina. Endotracheal tube courses into the left upper abdomen. Right jugular catheter terminates near the cavoatrial junction. The cardiac silhouette is suboptimally evaluated due  to surrounding airspace opacity and leftward patient rotation. Lung volumes are diminished with pulmonary vascular congestion.  There is some improved aeration of the left upper lobe. Dense consolidation remains in the left lung base. Patchy right perihilar and right basilar opacities have mildly increased. Bilateral pleural effusions are suspected. No pneumothorax is identified. IMPRESSION: 1. Endotracheal tube terminates slightly above the thoracic inlet. Consider advancement. 2. Improved left upper lobe aeration. Persistent dense left basilar consolidation and mildly increased right lung airspace opacity. Electronically Signed   By: Logan Bores M.D.   On: 06/23/2017 07:07   Dg Abd 1 View  Result Date: 06/21/2017 CLINICAL DATA:  Possible sepsis. Status post orogastric tube placement. EXAM: ABDOMEN - 1 VIEW COMPARISON:  None in PACs FINDINGS: The patient has undergone placement of an esophagogastric tube whose tip and proximal port lie in the gastric body. There are loops of mildly distended gas-filled small bowel in the mid and lower abdomen. IMPRESSION: Successful placement of an esophagogastric tube with reasonable positioning of its tip. Electronically Signed   By: David  Martinique M.D.   On: 06/21/2017 12:23   Ct Head Wo Contrast  Result Date: 06/21/2017 CLINICAL DATA:  Unresponsive.  Possible stroke. EXAM: CT HEAD WITHOUT CONTRAST TECHNIQUE: Contiguous axial images were obtained from the base of the skull through the vertex without intravenous contrast. COMPARISON:  None. FINDINGS: Brain: There is no evidence of acute infarct, intracranial hemorrhage, mass, midline shift, or extra-axial fluid collection. There is mild cerebral atrophy, mildly prominent for age. Vascular: Calcified atherosclerosis at the skullbase. No hyperdense vessel. Skull: No fracture or suspicious osseous lesion. Retained metallic BB in the right parietal skull. Sinuses/Orbits: Moderate volume right sphenoid sinus fluid. Mild  bilateral ethmoid air cell mucosal thickening. Trace bilateral mastoid fluid. Unremarkable orbits. Other: Endotracheal tube on topogram. IMPRESSION: No evidence of acute intracranial abnormality. Electronically Signed   By: Logan Bores M.D.   On: 06/21/2017 12:59   Dg Chest Port 1 View  Result Date: 06/22/2017 CLINICAL DATA:  Central line placement EXAM: PORTABLE CHEST 1 VIEW COMPARISON:  06/21/2017 at 11:54 FINDINGS: Knee right jugular central line extends to the low SVC. Endotracheal tube tip is 6.6 cm above the carina. Nasogastric tube extends into the stomach and beyond the inferior edge of the image. Near complete opacification of the left hemithorax with volume loss. Ground-glass opacities in the right hemithorax. Right pleural effusion. No pneumothorax. IMPRESSION: 1. Satisfactorily positioned support equipment. 2. Near complete left hemithorax opacification. Small right pleural effusion. Electronically Signed   By: Andreas Newport M.D.   On: 06/22/2017 05:05   Dg Chest Port 1 View  Result Date: 06/21/2017 CLINICAL DATA:  Respiratory failure, intubated patient. History of CHF, hypertension, possible sepsis. EXAM: PORTABLE CHEST 1 VIEW COMPARISON:  Portable chest x-ray of October 11, 2012 FINDINGS: The lungs are hypoinflated. External pacemaker defibrillator pads are present. The interstitial markings of both lungs are increased greatest on the left. The cardiac silhouette is enlarged and indistinct. Small bilateral pleural effusions are suspected. The endotracheal tube tip lies approximately 1.5 cm above the carina. IMPRESSION: Limited study due to hypoinflation. Diffuse interstitial and early alveolar opacities predominantly on the left are worrisome for pneumonia. Underlying low-grade CHF is suspected. Electronically Signed   By: David  Martinique M.D.   On: 06/21/2017 12:22    Assessment:  Garrit Marrow is a 61 y.o. male with schizophrenia admitted to the ICU unresponsive s/p intubation.  He  remains bradycardic and hypothermic.  Presentation is c/w sepsis.  CXR revealed pneumonia.  Lactic acid was normal.    Peripheral smear revealed leukopenia and  thrombocytopenia.  There were no schistocytes.  Plan:   1. Hematology:  Thrombocytopenia likely secondary to sepsis and limited marrow reserve.  Platelet count stable at 17,000.  PT and fibrinogen are normal.  No evidence of DIC.  No evidence of TTP/HUS.  No bleeding.  Transfuse platelets if needed.  Normal labs include hepatitis B core antibody, hepatitis C antibody, HIV testing, ANA is pending.  Transfuse platelets as needed.  Discussed with patient's niece, his medical power of attorney.  Patient on medications which can be associated with thrombocytopenia (valproic acid 1-27% dose related), Prolixin (leukopenia and thrombocytopenia), and Plaquenil (agranulocytosis and thrombocytopenia).  Urine toxicology screen was unrevealing.  2.  Infectious disease: Patient on broad spectrum coverage (ceftazidime and vancomycin).  MRSA positive. Blood culture and ID panel + methicillin susceptible coagulase negative staph.  Recent UTI on Macrobid.     3.  Pulmonary:  Patient remains on a ventilator.  CXR today reveals improvement in LUL aeration ans persistent dense basilar consolidation and mildy increase right lung opacity.  4.  Cardiovascular:  Patient on Levophed. Bradycardia resolved.  5.  Neurology:  Patient initially unresponsive, hypothermic, and bradycardic worrisome for sepsis.  Head CT without contrast is negative.  TSH minimally elevated.  Temperature now back to normal range.   AM cortisol was normal.  Ammonia level is normal.  Thiamine (B1) is pending.   Lequita Asal, MD  06/23/2017, 10:37 AM

## 2017-06-23 NOTE — Progress Notes (Addendum)
Pharmacy Antibiotic Note  Jerry [name] is a 61 y.o. male admitted on 06/21/2017 with Sepsis secondary to HCAP.  Pharmacy has been consulted for Vancomycin and ceftazidime dosing. Patient will also receive azithromycin  Patient received Vancomycin 1gm IV, Aztreonam 2g IV, and Levofloxacin 750mg  IV x 1 dose in ED.   There is a listed PCN allergy, which is reported to be Hives according to outpatient records.   Plan: VT resulted at 60 mcg/mL is not an accurate trough. Vancomycin 1250 mg was charted at 1742 and lab was drawn at 1831. Will retime VT to be drawn prior to next dose. Goal VT 15-20 mcg/mL Expect to see accumulation due to BMI of 40.    7/28 0000 vanc level 36. Hold dose for now. Recheck level with AM labs. PRN vanc ordered as placeholder.  7/28 0500 vanc level 33. Recheck with tomorrow AM labs. Will restart when level <20.  Continue  patient on ceftazidime 2g IV every 8 hours.   Height: 6\' 1"  (185.4 cm) Weight: (!) 302 lb 14.6 oz (137.4 kg) IBW/kg (Calculated) : 79.9  Temp (24hrs), Avg:96.8 F (36 C), Min:95.7 F (35.4 C), Max:98.5 F (36.9 C)   Recent Labs Lab 06/21/17 1138 06/21/17 1452 06/22/17 0330 06/22/17 0618 06/22/17 1831 06/23/17 0010  WBC 2.8* 2.9*  --  7.3  --   --   CREATININE 0.53*  --  0.70  --   --   --   LATICACIDVEN 1.5  --   --   --   --   --   VANCOTROUGH  --   --   --   --  60* 36*    Estimated Creatinine Clearance: 141.1 mL/min (by C-G formula based on SCr of 0.7 mg/dL).    Allergies  Allergen Reactions  . Methadone   . Penicillins     .Has patient had a PCN reaction causing immediate rash, facial/tongue/throat swelling, SOB or lightheadedness with hypotension: Unknown Has patient had a PCN reaction causing severe rash involving mucus membranes or skin necrosis: Unknown Has patient had a PCN reaction that required hospitalization: Unknown Has patient had a PCN reaction occurring within the last 10 years: Unknown If all of the above  answers are "NO", then may proceed with Cephalosporin use.   Marland Kitchen Propoxyphene   . Sulfa Antibiotics     Antimicrobials this admission: 7/26 levofloxacin/Aztreonam x1 dose 7/26 vancomycin  >>  7/26 Ceftazidime>> 7/26 azithromycin>>    Dose adjustments this admission:  Microbiology results: 7/26  BCx: staph species, no mecA detected, GPC 7/26 Sputum/Respiratory: pending 7/26 MRSA PCR: positive  Thank you for allowing pharmacy to be a part of this patient's care.  Eloise Harman, PharmD, BCPS Clinical Pharmacist 06/23/2017 12:52 AM

## 2017-06-23 NOTE — Clinical Social Work Note (Signed)
CSW is aware that patient is from [facility]. Patient can return according to admissions coordinator Broadus John. CSW will assess when appropriate.   Santiago Bumpers, MSW, Latanya Presser [phone]

## 2017-06-23 NOTE — Progress Notes (Signed)
[facility] Progess Note    SYNOPSIS   61 y.o. male with schizophrenia who was admitted through the emergency room with presumed sepsis from pneumonia.   ASSESSMENT/PLAN    PULMONARY A:Acute respiratory failure with pneumonia.  -ABG 7.42/51/68/33. Compensated hypercapnic respiratory failure. 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 -I personally reviewed imaging, review chest x-ray, consistent with continued left lower lobe atelectasis and pneumonia. P:   --Continue abx.  --Wean vent as tolerated, however, not amenable to weaning today. Given high FiO2 requirements and continue pressor support.  VENTILATOR SETTINGS: Vent Mode: PRVC FiO2 (%):  [30 %-50 %] 45 % Set Rate:  [18 bmp] 18 bmp Vt Set:  [500 mL] 500 mL PEEP:  [5 cmH20] 5 cmH20 Plateau Pressure:  [9 [id number] cmH20] 18 cmH20  CARDIOVASCULAR A: Septic shock with hypotension, currently on levophed at 20 mics. P:  -Wean down pressors as tolerated.  HEMODYNAMICS:    RENAL A:  Acute oliguric renal failure secondary to septic shock. Urine output and creatinine improving. P:   - limit nephrotoxic medications. --Start diuresis.  INTAKE / OUTPUT:  Intake/Output Summary (Last 24 hours) at 06/23/17 0751 Last data filed at 06/23/17 0700  Gross per 24 hour  Intake          5421.83 ml  Output             1290 ml  Net          4131.83 ml    GASTROINTESTINAL A:  --Tube feeds, GI prophylaxis.  HEMATOLOGIC A:  Thrombocytopenia P:  --heme following, workup in progress.   INFECTIOUS A:  Pneumonia with septic shock. Continue empiric antibiotic coverage. MRSA screen positive.  P:   Check respiratory culture.  Micro/culture results:  MRSA PCR 07/26 >> , positive Urine 07/26 >> -- Resp 07/26 >> Negative to date Blood 07/26 >>  Gram-positive cocci, ID pending. Strep Ag 07/26 >>  Legionella Ag 07/26 >> --  Antibiotics: 7/26 levofloxacin/Aztreonam x1 dose 7/26 vancomycin  >>  7/26  Ceftazidime>> 7/26 azithromycin>>   ENDOCRINE A:  Monitor glucose, controlled.  P:   SSI.   NEUROLOGIC/Psych A:  History of schizophrenia.  P:   -Sedation.  Resume psych meds when able.   MAJOR EVENTS/TEST RESULTS: 07/26 admission as above 07/26 CT head: Negative  Best Practices  DVT Prophylaxis: held due to low platelets.  GI Prophylaxis: ppi.    ---------------------------------------   ----------------------------------------   Name: Simone Tuckey MRN: [id number] DOB: [birth year]    ADMISSION DATE:  06/21/2017      SUBJECTIVE:   Pt currently on the ventilator, can not provide history or review of systems.   Review of Systems:  -   VITAL SIGNS: Temp:  [95.7 F (35.4 C)-99 F (37.2 C)] 99 F (37.2 C) (07/28 0700) Pulse Rate:  [59-93] 85 (07/28 0700) Resp:  [5-25] 5 (07/28 0700) BP: (84-128)/(45-89) 101/58 (07/28 0700) SpO2:  [99 %-100 %] 100 % (07/28 0700) FiO2 (%):  [30 %-50 %] 45 % (07/28 0745) Weight:  [324 lb 8.3 oz (147.2 kg)] 324 lb 8.3 oz (147.2 kg) (07/28 0429)     PHYSICAL EXAMINATION: Physical Examination:   VS: BP (!) 101/58   Pulse 85   Temp 99 F (37.2 C) Comment (Src): foley temp probe  Resp (!) 5   Ht 6\' 1"  (1.854 m)   Wt (!) 324 lb 8.3 oz (147.2 kg)   SpO2 100%   BMI 42.81 kg/m   General Appearance: No distress, on vent.  Neuro:without focal findings, mental status reduced.  HEENT: PERRLA, EOM intact. Pulmonary: normal breath sounds, decreased air entry bilaterally.   CardiovascularNormal S1,S2.  No m/r/g.   Abdomen: Benign, Soft, non-tender. Renal:  No costovertebral tenderness  GU:  Not performed at this time. Endocrine: No evident thyromegaly. Skin:   warm, no rashes, no ecchymosis  Extremities: normal, no cyanosis, clubbing.    LABORATORY PANEL:   CBC  Recent Labs Lab 06/23/17 0113  WBC 5.2  HGB 10.0*  HCT 30.1*  PLT 17*    Chemistries   Recent Labs Lab 06/23/17 0113 06/23/17 0422  NA 142  --     K 3.7  --   CL 108  --   CO2 30  --   GLUCOSE 89  --   BUN 18  --   CREATININE 0.63  --   CALCIUM 8.2*  --   MG  --  2.5*  PHOS  --  4.2  AST 21  --   ALT 25  --   ALKPHOS 66  --   BILITOT 0.5  --      Recent Labs Lab 06/22/17 1945 06/22/17 1948 06/22/17 2043 06/22/17 2359 06/23/17 0010 06/23/17 0343  GLUCAP 66 70 124* 83 80 88    Recent Labs Lab 06/21/17 1200 06/23/17 0110  PHART 7.47* 7.42  PCO2ART 49* 51*  PO2ART 75* 68*    Recent Labs Lab 06/21/17 1138 06/22/17 0330 06/23/17 0113  AST 38 31 21  ALT 39 33 25  ALKPHOS 92 71 66  BILITOT 0.5 0.6 0.5  ALBUMIN 3.0* 2.4* 2.1*    Cardiac Enzymes  Recent Labs Lab 06/22/17 0330  TROPONINI <0.03    RADIOLOGY:  Dg Chest 1 View  Result Date: 06/23/2017 CLINICAL DATA:  Dyspnea. EXAM: CHEST 1 VIEW COMPARISON:  06/22/2017 FINDINGS: Endotracheal tube terminates slightly above the thoracic inlet, approximately 8 cm above the carina. Endotracheal tube courses into the left upper abdomen. Right jugular catheter terminates near the cavoatrial junction. The cardiac silhouette is suboptimally evaluated due to surrounding airspace opacity and leftward patient rotation. Lung volumes are diminished with pulmonary vascular congestion. There is some improved aeration of the left upper lobe. Dense consolidation remains in the left lung base. Patchy right perihilar and right basilar opacities have mildly increased. Bilateral pleural effusions are suspected. No pneumothorax is identified. IMPRESSION: 1. Endotracheal tube terminates slightly above the thoracic inlet. Consider advancement. 2. Improved left upper lobe aeration. Persistent dense left basilar consolidation and mildly increased right lung airspace opacity. Electronically Signed   By: Logan Bores M.D.   On: 06/23/2017 07:07   Dg Abd 1 View  Result Date: 06/21/2017 CLINICAL DATA:  Possible sepsis. Status post orogastric tube placement. EXAM: ABDOMEN - 1 VIEW  COMPARISON:  None in PACs FINDINGS: The patient has undergone placement of an esophagogastric tube whose tip and proximal port lie in the gastric body. There are loops of mildly distended gas-filled small bowel in the mid and lower abdomen. IMPRESSION: Successful placement of an esophagogastric tube with reasonable positioning of its tip. Electronically Signed   By: David  Martinique M.D.   On: 06/21/2017 12:23   Ct Head Wo Contrast  Result Date: 06/21/2017 CLINICAL DATA:  Unresponsive.  Possible stroke. EXAM: CT HEAD WITHOUT CONTRAST TECHNIQUE: Contiguous axial images were obtained from the base of the skull through the vertex without intravenous contrast. COMPARISON:  None. FINDINGS: Brain: There is no evidence of acute infarct, intracranial hemorrhage, mass, midline shift, or extra-axial fluid  collection. There is mild cerebral atrophy, mildly prominent for age. Vascular: Calcified atherosclerosis at the skullbase. No hyperdense vessel. Skull: No fracture or suspicious osseous lesion. Retained metallic BB in the right parietal skull. Sinuses/Orbits: Moderate volume right sphenoid sinus fluid. Mild bilateral ethmoid air cell mucosal thickening. Trace bilateral mastoid fluid. Unremarkable orbits. Other: Endotracheal tube on topogram. IMPRESSION: No evidence of acute intracranial abnormality. Electronically Signed   By: Logan Bores M.D.   On: 06/21/2017 12:59   Dg Chest Port 1 View  Result Date: 06/22/2017 CLINICAL DATA:  Central line placement EXAM: PORTABLE CHEST 1 VIEW COMPARISON:  06/21/2017 at 11:54 FINDINGS: Knee right jugular central line extends to the low SVC. Endotracheal tube tip is 6.6 cm above the carina. Nasogastric tube extends into the stomach and beyond the inferior edge of the image. Near complete opacification of the left hemithorax with volume loss. Ground-glass opacities in the right hemithorax. Right pleural effusion. No pneumothorax. IMPRESSION: 1. Satisfactorily positioned support  equipment. 2. Near complete left hemithorax opacification. Small right pleural effusion. Electronically Signed   By: Andreas Newport M.D.   On: 06/22/2017 05:05   Dg Chest Port 1 View  Result Date: 06/21/2017 CLINICAL DATA:  Respiratory failure, intubated patient. History of CHF, hypertension, possible sepsis. EXAM: PORTABLE CHEST 1 VIEW COMPARISON:  Portable chest x-ray of October 11, 2012 FINDINGS: The lungs are hypoinflated. External pacemaker defibrillator pads are present. The interstitial markings of both lungs are increased greatest on the left. The cardiac silhouette is enlarged and indistinct. Small bilateral pleural effusions are suspected. The endotracheal tube tip lies approximately 1.5 cm above the carina. IMPRESSION: Limited study due to hypoinflation. Diffuse interstitial and early alveolar opacities predominantly on the left are worrisome for pneumonia. Underlying low-grade CHF is suspected. Electronically Signed   By: David  Martinique M.D.   On: 06/21/2017 12:22        --Marda Stalker, MD.  ICU Pager: [phone] [facility] Office Number: [phone]   06/23/2017   Critical Care Attestation.  I have personally obtained a history, examined the patient, evaluated laboratory and imaging results, formulated the assessment and plan and placed orders. The Patient requires high complexity decision making for assessment and support, frequent evaluation and titration of therapies, application of advanced monitoring technologies and extensive interpretation of multiple databases. The patient has critical illness that could lead imminently to failure of 1 or more organ systems and requires the highest level of physician preparedness to intervene.  Critical Care Time devoted to patient care services described in this note is 35 minutes and is exclusive of time spent in procedures supervisory time of NP.

## 2017-06-24 LAB — BASIC METABOLIC PANEL
Anion gap: 6 (ref 5–15)
BUN: 26 mg/dL — AB (ref 6–20)
CALCIUM: 8 mg/dL — AB (ref 8.9–10.3)
CO2: 31 mmol/L (ref 22–32)
CREATININE: 0.67 mg/dL (ref 0.61–1.24)
Chloride: 103 mmol/L (ref 101–111)
GFR calc non Af Amer: >60 mL/min (ref >60)
Glucose, Bld: 75 mg/dL (ref 65–99)
Potassium: 4.3 mmol/L (ref 3.5–5.1)
SODIUM: 140 mmol/L (ref 135–145)

## 2017-06-24 LAB — CBC
HCT: 27.9 % — ABNORMAL LOW (ref 40.0–52.0)
Hemoglobin: 9.4 g/dL — ABNORMAL LOW (ref 13.0–18.0)
MCH: 30.5 pg (ref 26.0–34.0)
MCHC: 33.8 g/dL (ref 32.0–36.0)
MCV: 90.2 fL (ref 80.0–100.0)
Platelets: 16 10*3/uL — CL (ref 150–440)
RBC: 3.09 MIL/uL — ABNORMAL LOW (ref 4.40–5.90)
RDW: 17.5 % — AB (ref 11.5–14.5)
WBC: 5.2 10*3/uL (ref 3.8–10.6)

## 2017-06-24 LAB — CULTURE, BLOOD (ROUTINE X 2)

## 2017-06-24 LAB — MAGNESIUM: MAGNESIUM: 2.3 mg/dL (ref 1.7–2.4)

## 2017-06-24 LAB — GLUCOSE, CAPILLARY
GLUCOSE-CAPILLARY: 79 mg/dL (ref 65–99)
GLUCOSE-CAPILLARY: 83 mg/dL (ref 65–99)
GLUCOSE-CAPILLARY: 84 mg/dL (ref 65–99)
Glucose-Capillary: 105 mg/dL — ABNORMAL HIGH (ref 65–99)
Glucose-Capillary: 92 mg/dL (ref 65–99)
Glucose-Capillary: 74 mg/dL (ref 65–99)

## 2017-06-24 LAB — VANCOMYCIN, RANDOM: VANCOMYCIN RM: 11

## 2017-06-24 MED ORDER — VANCOMYCIN HCL 10 G IV SOLR
1250.0000 mg | INTRAVENOUS | Status: DC
  Administered 2017-06-24 – 2017-06-25 (×2): 1250 mg via INTRAVENOUS
  Filled 2017-06-24 (×4): qty 1250

## 2017-06-24 MED ORDER — FUROSEMIDE 10 MG/ML IJ SOLN
20.0000 mg | Freq: Three times a day (TID) | INTRAMUSCULAR | Status: AC
  Administered 2017-06-24 – 2017-06-25 (×3): 20 mg via INTRAVENOUS
  Filled 2017-06-24 (×3): qty 2

## 2017-06-24 MED ORDER — BENZTROPINE MESYLATE 0.5 MG PO TABS
0.5000 mg | ORAL_TABLET | Freq: Two times a day (BID) | ORAL | Status: DC
  Administered 2017-06-24 (×2): 0.5 mg via ORAL
  Filled 2017-06-24 (×4): qty 1

## 2017-06-24 NOTE — Progress Notes (Signed)
[facility] NAME: Jerry [name]    MR#:  [id number]  DATE OF BIRTH:  [birth year]  SUBJECTIVE:  CHIEF COMPLAINT:   Chief Complaint  Patient presents with  . Code Sepsis   - patient remains intubated, on ventilator and sedated. -Still requiring Levophed at 21 mcg per hour. When sedation is decreased, he would wake up, gets very restless with non purposeful movements. -Increased oropharyngeal secretions.  REVIEW OF SYSTEMS:  Review of Systems  Unable to perform ROS: Critical illness    DRUG ALLERGIES:   Allergies  Allergen Reactions  . Methadone   . Penicillins     .Has patient had a PCN reaction causing immediate rash, facial/tongue/throat swelling, SOB or lightheadedness with hypotension: Unknown Has patient had a PCN reaction causing severe rash involving mucus membranes or skin necrosis: Unknown Has patient had a PCN reaction that required hospitalization: Unknown Has patient had a PCN reaction occurring within the last 10 years: Unknown If all of the above answers are "NO", then may proceed with Cephalosporin use.   Marland Kitchen Propoxyphene   . Sulfa Antibiotics     VITALS:  Blood pressure (!) 101/53, pulse 82, temperature 99.5 F (37.5 C), resp. rate 17, height 6\' 1"  (1.854 m), weight (!) 141.2 kg (311 lb 4.6 oz), SpO2 97 %.  PHYSICAL EXAMINATION:  Physical Exam  GENERAL:  61 y.o.-year-old Obese patient lying in the bed, sedated, not in any acute distress.  EYES: Pupils equal, round, reactive to light and accommodation. No scleral icterus. Extraocular muscles intact.  HEENT: Head atraumatic, normocephalic. Oropharynx and nasopharynx clear. Increased oral secretions noted. Has an OG tube and endotracheal tube in NECK:  Supple, no jugular venous distention. No thyroid enlargement, no tenderness.  LUNGS: Normal breath sounds bilaterally, no wheezing, rales or crepitation. No use of accessory muscles of respiration. Decreased  bibasilar breath sounds with some rhonchi CARDIOVASCULAR: S1, S2 normal. No murmurs, rubs, or gallops.  ABDOMEN: Soft, obese, nontender, nondistended. Bowel sounds present. No organomegaly or mass.  EXTREMITIES: No cyanosis, or clubbing. 1+ pedal edema, chronic hyperpigmentation of both feet but dorsalis pedis palpable bilaterally, gangrene appearing NEUROLOGIC: . Sedated this morning. Able to move all 4 extremities when sedation is decreased. Nonpurposeful movements, not following commands PSYCHIATRIC: The patient is on sedation.  SKIN: No obvious rash, lesion, or ulcer.    LABORATORY PANEL:   CBC  Recent Labs Lab 06/24/17 0433  WBC 5.2  HGB 9.4*  HCT 27.9*  PLT 16*   ------------------------------------------------------------------------------------------------------------------  Chemistries   Recent Labs Lab 06/23/17 0113  06/24/17 0433  NA 142  --  140  K 3.7  --  4.3  CL 108  --  103  CO2 30  --  31  GLUCOSE 89  --  75  BUN 18  --  26*  CREATININE 0.63  --  0.67  CALCIUM 8.2*  --  8.0*  MG  --   < > 2.3  AST 21  --   --   ALT 25  --   --   ALKPHOS 66  --   --   BILITOT 0.5  --   --   < > = values in this interval not displayed. ------------------------------------------------------------------------------------------------------------------  Cardiac Enzymes  Recent Labs Lab 06/22/17 0330  TROPONINI <0.03   ------------------------------------------------------------------------------------------------------------------  RADIOLOGY:  Dg Chest 1 View  Result Date: 06/23/2017 CLINICAL DATA:  Dyspnea. EXAM: CHEST 1 VIEW COMPARISON:  06/22/2017 FINDINGS: Endotracheal tube  terminates slightly above the thoracic inlet, approximately 8 cm above the carina. Endotracheal tube courses into the left upper abdomen. Right jugular catheter terminates near the cavoatrial junction. The cardiac silhouette is suboptimally evaluated due to surrounding airspace opacity and  leftward patient rotation. Lung volumes are diminished with pulmonary vascular congestion. There is some improved aeration of the left upper lobe. Dense consolidation remains in the left lung base. Patchy right perihilar and right basilar opacities have mildly increased. Bilateral pleural effusions are suspected. No pneumothorax is identified. IMPRESSION: 1. Endotracheal tube terminates slightly above the thoracic inlet. Consider advancement. 2. Improved left upper lobe aeration. Persistent dense left basilar consolidation and mildly increased right lung airspace opacity. Electronically Signed   By: Logan Bores M.D.   On: 06/23/2017 07:07    EKG:   Orders placed or performed during the hospital encounter of 06/21/17  . EKG 12-Lead  . EKG 12-Lead  . EKG 12-Lead  . EKG 12-Lead    ASSESSMENT AND PLAN:   61 year old obese male with past medical history significant for schizophrenia, seizure disorder, hypothyroidism, rheumatoid arthritis from a nursing home was brought in secondary to unresponsive state, hypothermia.  #1 sepsis-secondary to healthcare acquired pneumonia. -one set of blood cultures with coagulase-negative staphylococcus, likely contaminant.. Remains on vancomycin, azithromycin and Fortaz -Blood pressure is borderline, requiring Levophed. -However procalcitonin is negative  - Management per ICU team -Since blood pressure is low, his blood pressure medications including Norvasc, Coreg, clonidine, torsemide are currently held  #2 acute respiratory failure-secondary to healthcare acquired pneumonia. Ventilator dependent respiratory failure at this time. -Management per ICU team. -chest x-ray with left lower lobe infiltrate, follow-up chest x-ray with some improvement in the left upper lobe noted but worsening at the bases -Increased respiratory oropharyngeal and nasopharyngeal secretions. Could benefit from scopolamine  patch -Continue inhalers and IV antibiotics. -On vent.  Sedated with fentanyl. -Spontaneous breathing trial every day per protocol  #3 acute thrombocytopenia-likely sepsis related.platelet count still around 16k -could also be medication induced is on valproic acid, Prolixin -Appreciate oncology consult. Monitor closely. No indication for transfusion at this time.  #4 seizure disorder-continue Depakote  #5 schizophrenia-continue Prolixin & valproic acid.  -Trazodone, Klonopin are on hold.  #6 DVT prophylaxis- Ted's and SCDs for now, no heparin products due to thrombocytopenia     All the records are reviewed and case discussed with Care Management/Social Workerr. Management plans discussed with the patient, family and they are in agreement.  CODE STATUS: Full code  TOTAL TIME TAKING CARE OF THIS PATIENT: 33 minutes.    Gladstone Lighter M.D on 06/24/2017 at 7:48 AM  Between 7am to 6pm - Pager - [phone]  After 6pm go to [email or web address] - password [email or web address] [facility]  Office  [phone]  CC: Primary care physician; Patient, No Pcp Per

## 2017-06-24 NOTE — Progress Notes (Signed)
Pharmacy Antibiotic Note  Jerry [name] is a 61 y.o. male admitted on 06/21/2017 with Sepsis secondary to HCAP.  Pharmacy has been consulted for Vancomycin and ceftazidime dosing. Patient will also receive azithromycin  Patient received Vancomycin 1gm IV, Aztreonam 2g IV, and Levofloxacin 750mg  IV x 1 dose in ED.   There is a listed PCN allergy, which is reported to be Hives according to outpatient records.   Plan: VT resulted at 60 mcg/mL is not an accurate trough. Vancomycin 1250 mg was charted at 1742 and lab was drawn at 1831. Will retime VT to be drawn prior to next dose. Goal VT 15-20 mcg/mL Expect to see accumulation due to BMI of 40.    7/28 0000 vanc level 36. Hold dose for now. Recheck level with AM labs. PRN vanc ordered as placeholder.  7/28 0500 vanc level 33. Recheck with tomorrow AM labs. Will restart when level <20.  7/29 AM vancomycin level 11. Based on last 2 levels calculated kei 0.045, t1/2 15 hours.  Will restart at 1250 mg q 18 hours. Level before 3rd new dose.  Continue  patient on ceftazidime 2g IV every 8 hours.   Height: 6\' 1"  (185.4 cm) Weight: (!) 311 lb 4.6 oz (141.2 kg) IBW/kg (Calculated) : 79.9  Temp (24hrs), Avg:99.3 F (37.4 C), Min:97 F (36.1 C), Max:99.7 F (37.6 C)   Recent Labs Lab 06/21/17 1138 06/21/17 1452 06/22/17 0330 06/22/17 0618 06/22/17 1831 06/23/17 0010 06/23/17 0113 06/23/17 0155 06/23/17 0422 06/24/17 0433  WBC 2.8* 2.9*  --  7.3  --   --  5.2  --   --  5.2  CREATININE 0.53*  --  0.70  --   --   --  0.63  --   --  0.67  LATICACIDVEN 1.5  --   --   --   --   --   --  1.0  --   --   VANCOTROUGH  --   --   --   --  60* 36*  --   --   --   --   VANCORANDOM  --   --   --   --   --   --   --   --  33 11    Estimated Creatinine Clearance: 143.2 mL/min (by C-G formula based on SCr of 0.67 mg/dL).    Allergies  Allergen Reactions  . Methadone   . Penicillins     .Has patient had a PCN reaction causing immediate rash,  facial/tongue/throat swelling, SOB or lightheadedness with hypotension: Unknown Has patient had a PCN reaction causing severe rash involving mucus membranes or skin necrosis: Unknown Has patient had a PCN reaction that required hospitalization: Unknown Has patient had a PCN reaction occurring within the last 10 years: Unknown If all of the above answers are "NO", then may proceed with Cephalosporin use.   Marland Kitchen Propoxyphene   . Sulfa Antibiotics     Antimicrobials this admission: 7/26 levofloxacin/Aztreonam x1 dose 7/26 vancomycin  >>  7/26 Ceftazidime>> 7/26 azithromycin>>    Dose adjustments this admission:   Microbiology results: 7/26  BCx: staph species, no mecA detected, GPC 7/26 Sputum/Respiratory: pending 7/26 MRSA PCR: positive  Thank you for allowing pharmacy to be a part of this patient's care.  Eloise Harman, PharmD, BCPS Clinical Pharmacist 06/24/2017 6:13 AM

## 2017-06-24 NOTE — Progress Notes (Signed)
Patient continues with agitation when turning and positioning and basic nursing care. Levophed titrated to 21 mcg. Patient responsive to pain.   No significant changes noted.

## 2017-06-24 NOTE — Progress Notes (Signed)
[facility] Progess Note    SYNOPSIS   61 y.o. male with schizophrenia who was admitted through the emergency room with presumed sepsis from pneumonia.   ASSESSMENT/PLAN    PULMONARY A:Acute respiratory failure with pneumonia.  -Compensated hypercapnic respiratory failure. 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  P:   --Continue abx.  --Wean vent as tolerated.  VENTILATOR SETTINGS: Vent Mode: PRVC FiO2 (%):  [35 %-40 %] 35 % Set Rate:  [18 bmp] 18 bmp Vt Set:  [500 mL] 500 mL PEEP:  [5 cmH20] 5 cmH20 Pressure Support:  [10 cmH20] 10 cmH20 Plateau Pressure:  [16 cmH20-18 cmH20] 16 cmH20  CARDIOVASCULAR A: Septic shock with hypotension, currently on levophed at 21 mics. P:  -Wean down pressors as tolerated.  HEMODYNAMICS:    RENAL A:  S/p Acute oliguric renal failure secondary to septic shock. Urine output and creatinine improved. P:   - limit nephrotoxic medications. --Continue diuresis.  INTAKE / OUTPUT:  Intake/Output Summary (Last 24 hours) at 06/24/17 0805 Last data filed at 06/24/17 0700  Gross per 24 hour  Intake          4742.41 ml  Output             4325 ml  Net           417.41 ml    GASTROINTESTINAL A:  --Tube feeds, GI prophylaxis.  HEMATOLOGIC A:  Thrombocytopenia P:  --heme following, workup in progress.   INFECTIOUS A:  Pneumonia with septic shock. Continue empiric antibiotic coverage. MRSA screen positive.  P:   Continue abx.  Micro/culture results:  MRSA PCR 07/26 >> , positive Urine 07/26 >> -- Resp 07/26 >> Negative to date Blood 07/26 >>  staph species, likely contaminant.  Strep Ag 07/26 >>  Legionella Ag 07/26 >> --  Antibiotics: 7/26 levofloxacin/Aztreonam x1 dose 7/26 vancomycin  >>  7/26 Ceftazidime>> 7/26 azithromycin>>   ENDOCRINE A:  Monitor glucose, controlled.  P:   SSI.   NEUROLOGIC/Psych A:  History of schizophrenia.  P:   -Sedation.  Resume psych meds when able.   MAJOR  EVENTS/TEST RESULTS: 07/26 admission as above 07/26 CT head: Negative  Best Practices  DVT Prophylaxis: held due to low platelets.  GI Prophylaxis: ppi.    ---------------------------------------   ----------------------------------------   Name: Jerry [name] MRN: [id number] DOB: [birth year]    ADMISSION DATE:  06/21/2017      SUBJECTIVE:   Pt currently on the ventilator, can not provide history or review of systems.   Review of Systems:  -   VITAL SIGNS: Temp:  [97 F (36.1 C)-99.7 F (37.6 C)] 99.5 F (37.5 C) (07/29 0730) Pulse Rate:  [71-95] 82 (07/29 0730) Resp:  [0-26] 17 (07/29 0730) BP: (66-122)/(37-77) 101/53 (07/29 0645) SpO2:  [96 %-100 %] 97 % (07/29 0730) FiO2 (%):  [35 %-40 %] 35 % (07/29 0400) Weight:  [311 lb 4.6 oz (141.2 kg)] 311 lb 4.6 oz (141.2 kg) (07/29 0500)     PHYSICAL EXAMINATION: Physical Examination:   VS: BP (!) 101/53   Pulse 82   Temp 99.5 F (37.5 C)   Resp 17   Ht 6\' 1"  (1.854 m)   Wt (!) 311 lb 4.6 oz (141.2 kg)   SpO2 97%   BMI 41.07 kg/m   General Appearance: No distress, on vent.  Neuro:without focal findings, mental status reduced.  HEENT: PERRLA, EOM intact. Pulmonary: normal breath sounds, decreased air entry bilaterally.   CardiovascularNormal S1,S2.  No m/r/g.  Abdomen: Benign, Soft, non-tender. Renal:  No costovertebral tenderness  GU:  Not performed at this time. Endocrine: No evident thyromegaly. Skin:   warm, no rashes, no ecchymosis  Extremities: normal, no cyanosis, clubbing.    LABORATORY PANEL:   CBC  Recent Labs Lab 06/24/17 0433  WBC 5.2  HGB 9.4*  HCT 27.9*  PLT 16*    Chemistries   Recent Labs Lab 06/23/17 0113  06/23/17 1700 06/24/17 0433  NA 142  --   --  140  K 3.7  --   --  4.3  CL 108  --   --  103  CO2 30  --   --  31  GLUCOSE 89  --   --  75  BUN 18  --   --  26*  CREATININE 0.63  --   --  0.67  CALCIUM 8.2*  --   --  8.0*  MG  --   < > 2.2 2.3  PHOS  --    < > 4.2  --   AST 21  --   --   --   ALT 25  --   --   --   ALKPHOS 66  --   --   --   BILITOT 0.5  --   --   --   < > = values in this interval not displayed.   Recent Labs Lab 06/23/17 0741 06/23/17 1202 06/23/17 1550 06/23/17 1950 06/23/17 2336 06/24/17 0333  GLUCAP 92 75 80 92 81 83    Recent Labs Lab 06/21/17 1200 06/23/17 0110  PHART 7.47* 7.42  PCO2ART 49* 51*  PO2ART 75* 68*    Recent Labs Lab 06/21/17 1138 06/22/17 0330 06/23/17 0113  AST 38 31 21  ALT 39 33 25  ALKPHOS 92 71 66  BILITOT 0.5 0.6 0.5  ALBUMIN 3.0* 2.4* 2.1*    Cardiac Enzymes  Recent Labs Lab 06/22/17 0330  TROPONINI <0.03    RADIOLOGY:  Dg Chest 1 View  Result Date: 06/23/2017 CLINICAL DATA:  Dyspnea. EXAM: CHEST 1 VIEW COMPARISON:  06/22/2017 FINDINGS: Endotracheal tube terminates slightly above the thoracic inlet, approximately 8 cm above the carina. Endotracheal tube courses into the left upper abdomen. Right jugular catheter terminates near the cavoatrial junction. The cardiac silhouette is suboptimally evaluated due to surrounding airspace opacity and leftward patient rotation. Lung volumes are diminished with pulmonary vascular congestion. There is some improved aeration of the left upper lobe. Dense consolidation remains in the left lung base. Patchy right perihilar and right basilar opacities have mildly increased. Bilateral pleural effusions are suspected. No pneumothorax is identified. IMPRESSION: 1. Endotracheal tube terminates slightly above the thoracic inlet. Consider advancement. 2. Improved left upper lobe aeration. Persistent dense left basilar consolidation and mildly increased right lung airspace opacity. Electronically Signed   By: Logan Bores M.D.   On: 06/23/2017 07:07        --Marda Stalker, MD.  ICU Pager: [phone] [facility] Office Number: [phone]   06/24/2017   Critical Care Attestation.  I have personally  obtained a history, examined the patient, evaluated laboratory and imaging results, formulated the assessment and plan and placed orders. The Patient requires high complexity decision making for assessment and support, frequent evaluation and titration of therapies, application of advanced monitoring technologies and extensive interpretation of multiple databases. The patient has critical illness that could lead imminently to failure of 1 or more organ systems and requires the highest level of  physician preparedness to intervene.  Critical Care Time devoted to patient care services described in this note is 32 minutes and is exclusive of time spent in procedures supervisory time of NP.

## 2017-06-25 ENCOUNTER — Encounter: Payer: Self-pay | Admitting: Adult Health

## 2017-06-25 ENCOUNTER — Inpatient Hospital Stay: Payer: Medicaid Other

## 2017-06-25 DIAGNOSIS — Z978 Presence of other specified devices: Secondary | ICD-10-CM

## 2017-06-25 LAB — COMPREHENSIVE METABOLIC PANEL
ALT: 17 U/L (ref 17–63)
ANION GAP: 4 — AB (ref 5–15)
AST: 17 U/L (ref 15–41)
Albumin: 2.1 g/dL — ABNORMAL LOW (ref 3.5–5.0)
Alkaline Phosphatase: 59 U/L (ref 38–126)
BUN: 29 mg/dL — ABNORMAL HIGH (ref 6–20)
CHLORIDE: 102 mmol/L (ref 101–111)
CO2: 33 mmol/L — AB (ref 22–32)
Calcium: 8.1 mg/dL — ABNORMAL LOW (ref 8.9–10.3)
Creatinine, Ser: 0.54 mg/dL — ABNORMAL LOW (ref 0.61–1.24)
GFR calc non Af Amer: >60 mL/min (ref >60)
Glucose, Bld: 82 mg/dL (ref 65–99)
POTASSIUM: 4.8 mmol/L (ref 3.5–5.1)
SODIUM: 139 mmol/L (ref 135–145)
Total Bilirubin: 0.4 mg/dL (ref 0.3–1.2)
Total Protein: 5.6 g/dL — ABNORMAL LOW (ref 6.5–8.1)

## 2017-06-25 LAB — GLUCOSE, CAPILLARY
GLUCOSE-CAPILLARY: 85 mg/dL (ref 65–99)
GLUCOSE-CAPILLARY: 76 mg/dL (ref 65–99)
GLUCOSE-CAPILLARY: 74 mg/dL (ref 65–99)
Glucose-Capillary: 73 mg/dL (ref 65–99)
Glucose-Capillary: 81 mg/dL (ref 65–99)
Glucose-Capillary: 84 mg/dL (ref 65–99)

## 2017-06-25 LAB — CULTURE, RESPIRATORY

## 2017-06-25 LAB — BLOOD GAS, ARTERIAL
ACID-BASE EXCESS: 11.9 mmol/L — AB (ref 0.0–2.0)
Bicarbonate: 38.9 mmol/L — ABNORMAL HIGH (ref 20.0–28.0)
FIO2: 0.3
LHR: 18 {breaths}/min
O2 SAT: 95.8 %
PCO2 ART: 60 mmHg — AB (ref 32.0–48.0)
PEEP: 5 cmH2O
PH ART: 7.42 (ref 7.350–7.450)
Patient temperature: 37
VT: 500 mL
pO2, Arterial: 79 mmHg — ABNORMAL LOW (ref 83.0–108.0)

## 2017-06-25 LAB — CBC
HCT: 27.4 % — ABNORMAL LOW (ref 40.0–52.0)
HEMOGLOBIN: 9.1 g/dL — AB (ref 13.0–18.0)
MCH: 29.7 pg (ref 26.0–34.0)
MCHC: 33.3 g/dL (ref 32.0–36.0)
MCV: 89 fL (ref 80.0–100.0)
Platelets: 17 10*3/uL — CL (ref 150–440)
RBC: 3.07 MIL/uL — AB (ref 4.40–5.90)
RDW: 17.5 % — ABNORMAL HIGH (ref 11.5–14.5)
WBC: 5.1 10*3/uL (ref 3.8–10.6)

## 2017-06-25 LAB — VITAMIN B1: Vitamin B1 (Thiamine): 112.4 nmol/L (ref 66.5–200.0)

## 2017-06-25 LAB — LEGIONELLA PNEUMOPHILA SEROGP 1 UR AG: L. pneumophila Serogp 1 Ur Ag: NEGATIVE

## 2017-06-25 LAB — TRIGLYCERIDES: TRIGLYCERIDES: 62 mg/dL (ref <150)

## 2017-06-25 LAB — PHOSPHORUS: Phosphorus: 4 mg/dL (ref 2.5–4.6)

## 2017-06-25 LAB — MAGNESIUM: Magnesium: 1.9 mg/dL (ref 1.7–2.4)

## 2017-06-25 LAB — TROPONIN I: Troponin I: <0.03 ng/mL (ref <0.03)

## 2017-06-25 MED ORDER — DEXMEDETOMIDINE HCL IN NACL 400 MCG/100ML IV SOLN
0.0000 ug/kg/h | INTRAVENOUS | Status: DC
  Filled 2017-06-25: qty 100

## 2017-06-25 MED ORDER — LEVETIRACETAM 100 MG/ML PO SOLN
1000.0000 mg | Freq: Two times a day (BID) | ORAL | Status: DC
  Filled 2017-06-25: qty 10

## 2017-06-25 MED ORDER — HALOPERIDOL LACTATE 5 MG/ML IJ SOLN
1.0000 mg | INTRAMUSCULAR | Status: DC | PRN
  Filled 2017-06-25: qty 1

## 2017-06-25 MED ORDER — PROPOFOL 1000 MG/100ML IV EMUL
0.0000 ug/kg/min | INTRAVENOUS | Status: DC
  Administered 2017-06-25: 10 ug/kg/min via INTRAVENOUS
  Administered 2017-06-25: 5 ug/kg/min via INTRAVENOUS
  Administered 2017-06-26: 10 ug/kg/min via INTRAVENOUS
  Filled 2017-06-25 (×3): qty 100

## 2017-06-25 MED ORDER — LEVOTHYROXINE SODIUM 150 MCG PO TABS
150.0000 ug | ORAL_TABLET | Freq: Every day | ORAL | Status: DC
  Administered 2017-06-25 – 2017-06-26 (×2): 150 ug
  Filled 2017-06-25 (×2): qty 1
  Filled 2017-06-25 (×2): qty 3

## 2017-06-25 MED ORDER — FREE WATER
100.0000 mL | Freq: Three times a day (TID) | Status: DC
  Administered 2017-06-25 – 2017-06-26 (×4): 100 mL

## 2017-06-25 MED ORDER — PANTOPRAZOLE SODIUM 40 MG PO PACK
40.0000 mg | PACK | Freq: Every day | ORAL | Status: DC
  Administered 2017-06-25: 40 mg
  Filled 2017-06-25: qty 20

## 2017-06-25 NOTE — Care Management (Signed)
Per CSW note patient is from long-term care at [facility]. RNCM consult received for LTAC which is not long-term placement- it is long-term/acute hospitalization however patient does not have payer for LTAC.

## 2017-06-25 NOTE — Progress Notes (Signed)
[facility] NAME: Jerry [name]    MR#:  [id number]  DATE OF BIRTH:  November 05, 1956  SUBJECTIVE:  CHIEF COMPLAINT:   Chief Complaint  Patient presents with  . Code Sepsis   -Remains intubated and sedated. Agitation and nonpurposeful movements noted when sedation is decreased. -Did not do well with spontaneous breathing trial this morning. -Levophed decreased down to 8 mcg  REVIEW OF SYSTEMS:  Review of Systems  Unable to perform ROS: Critical illness    DRUG ALLERGIES:   Allergies  Allergen Reactions  . Methadone   . Penicillins     .Has patient had a PCN reaction causing immediate rash, facial/tongue/throat swelling, SOB or lightheadedness with hypotension: Unknown Has patient had a PCN reaction causing severe rash involving mucus membranes or skin necrosis: Unknown Has patient had a PCN reaction that required hospitalization: Unknown Has patient had a PCN reaction occurring within the last 10 years: Unknown If all of the above answers are "NO", then may proceed with Cephalosporin use.   Marland Kitchen Propoxyphene   . Sulfa Antibiotics     VITALS:  Blood pressure 112/71, pulse 71, temperature 97.9 F (36.6 C), resp. rate 15, height 6\' 1"  (1.854 m), weight (!) 149 kg (328 lb 7.8 oz), SpO2 99 %.  PHYSICAL EXAMINATION:  Physical Exam  GENERAL:  61 y.o.-year-old Obese patient lying in the bed, sedated, not in any acute distress.  EYES: Pupils equal, round, reactive to light and accommodation. No scleral icterus. Extraocular muscles intact.  HEENT: Head atraumatic, normocephalic. Oropharynx and nasopharynx clear. Normalized oral secretions. Has an OG tube and endotracheal tube in NECK:  Supple, no jugular venous distention. No thyroid enlargement, no tenderness.  LUNGS: Normal breath sounds bilaterally, no wheezing, rales or crepitation. No use of accessory muscles of respiration. Decreased bibasilar breath sounds with some  rhonchi CARDIOVASCULAR: S1, S2 normal. No murmurs, rubs, or gallops.  ABDOMEN: Soft, obese, nontender, nondistended. Bowel sounds present. No organomegaly or mass.  EXTREMITIES: No cyanosis, or clubbing. 1+ pedal edema, chronic hyperpigmentation of both feet but dorsalis pedis palpable bilaterally, gangrene appearing NEUROLOGIC: . Sedated this morning. Able to move all 4 extremities when sedation is decreased. Nonpurposeful movements, not following commands PSYCHIATRIC: The patient is on sedation.  SKIN: No obvious rash, lesion, or ulcer.    LABORATORY PANEL:   CBC  Recent Labs Lab 06/25/17 0135  WBC 5.1  HGB 9.1*  HCT 27.4*  PLT 17*   ------------------------------------------------------------------------------------------------------------------  Chemistries   Recent Labs Lab 06/25/17 0135  NA 139  K 4.8  CL 102  CO2 33*  GLUCOSE 82  BUN 29*  CREATININE 0.54*  CALCIUM 8.1*  MG 1.9  AST 17  ALT 17  ALKPHOS 59  BILITOT 0.4   ------------------------------------------------------------------------------------------------------------------  Cardiac Enzymes  Recent Labs Lab 06/25/17 0736  TROPONINI <0.03   ------------------------------------------------------------------------------------------------------------------  RADIOLOGY:  Dg Chest Port 1 View  Result Date: 06/25/2017 CLINICAL DATA:  Acute respiratory failure EXAM: PORTABLE CHEST 1 VIEW COMPARISON:  06/23/2017 FINDINGS: Endotracheal tube tip measures 4.7 cm above the carina. Right central venous catheter tip over the low SVC region. No pneumothorax. Enteric tube tip is off the field of view but under the left hemidiaphragm. Cardiac enlargement. No vascular congestion. Left perihilar and right basilar infiltrates may represent edema or pneumonia. Small bilateral pleural effusions. No pneumothorax. Calcification of the aorta. IMPRESSION: Appliances appear in satisfactory position. Cardiac enlargement. Left  hilar and right basilar infiltrates may represent  edema or pneumonia. Small bilateral pleural effusions. Electronically Signed   By: Lucienne Capers M.D.   On: 06/25/2017 02:02    EKG:   Orders placed or performed during the hospital encounter of 06/21/17  . EKG 12-Lead  . EKG 12-Lead  . EKG 12-Lead  . EKG 12-Lead  . EKG 12-Lead  . EKG 12-Lead  . EKG 12-Lead  . EKG 12-Lead  . EKG 12-Lead  . EKG 12-Lead    ASSESSMENT AND PLAN:   61 year old obese male with past medical history significant for schizophrenia, seizure disorder, hypothyroidism, rheumatoid arthritis from a nursing home was brought in secondary to unresponsive state, hypothermia.  #1 sepsis-secondary to healthcare acquired pneumonia. -one set of blood cultures with coagulase-negative staphylococcus, likely contaminant.. Remains on vancomycin and Fortaz -Blood pressure is borderline, requiring Levophed. down to 57mcg -However procalcitonin is negative  - Management per ICU team -Since blood pressure is low, his blood pressure medications including Norvasc, Coreg, clonidine, torsemide are currently held  #2 acute respiratory failure-secondary to healthcare acquired pneumonia. Ventilator dependent respiratory failure at this time. -Management per ICU team. -chest x-ray with left lower lobe infiltrate, follow-up chest x-ray with some improvement in the left upper lobe noted but worsening at the bases -Increased respiratory oropharyngeal and nasopharyngeal secretions. Could benefit from scopolamine  patch -Continue inhalers and IV antibiotics. -On vent. Sedated with fentanyl. -Spontaneous breathing trial every day per protocol  #3 acute thrombocytopenia-likely sepsis related.platelet count still around 17k -could also be medication induced- was on valproic acid, Prolixin -Appreciate oncology consult. Monitor closely. No indication for transfusion at this time.  #4 hypothyroidism- continue synthroid  #5  schizophrenia-was on Prolixin & valproic acid, -Trazodone, Klonopin - currently are on hold.  #6 DVT prophylaxis- Ted's and SCDs for now, no heparin products due to thrombocytopenia     All the records are reviewed and case discussed with Care Management/Social Workerr. Management plans discussed with the patient, family and they are in agreement.  CODE STATUS: Full code  TOTAL TIME TAKING CARE OF THIS PATIENT: 32 minutes.    Mick Tanguma M.D on 06/25/2017 at 10:48 AM  Between 7am to 6pm - Pager - [phone]  After 6pm go to [email or web address] - password [email or web address] [facility]  Office  [phone]  CC: Primary care physician; Patient, No Pcp Per

## 2017-06-25 NOTE — Progress Notes (Signed)
Pharmacy Antibiotic Note  Jerry [name] is a 61 y.o. male admitted on 06/21/2017 with Sepsis secondary to HCAP.  Pharmacy has been consulted for Vancomycin and ceftazidime dosing.  Patient is MRSA PCR positive with S aureus growing in respiratory culture.   Plan: Will continue vancomycin 1250 mg iv q 18 hours. Trough scheduled with the third dose and will not represent steady-state but is necessary to r/o accumulation. Goal trough 15-20 mcg/ml.   Continue  patient on ceftazidime 2g IV every 8 hours.   Height: 6\' 1"  (185.4 cm) Weight: (!) 328 lb 7.8 oz (149 kg) IBW/kg (Calculated) : 79.9  Temp (24hrs), Avg:99.1 F (37.3 C), Min:97.3 F (36.3 C), Max:100 F (37.8 C)   Recent Labs Lab 06/21/17 1138 06/21/17 1452 06/22/17 0330 06/22/17 0618 06/22/17 1831 06/23/17 0010 06/23/17 0113 06/23/17 0155 06/23/17 0422 06/24/17 0433 06/25/17 0135  WBC 2.8* 2.9*  --  7.3  --   --  5.2  --   --  5.2 5.1  CREATININE 0.53*  --  0.70  --   --   --  0.63  --   --  0.67 0.54*  LATICACIDVEN 1.5  --   --   --   --   --   --  1.0  --   --   --   VANCOTROUGH  --   --   --   --  60* 36*  --   --   --   --   --   VANCORANDOM  --   --   --   --   --   --   --   --  33 11  --     Estimated Creatinine Clearance: 147.4 mL/min (A) (by C-G formula based on SCr of 0.54 mg/dL (L)).    Allergies  Allergen Reactions  . Methadone   . Penicillins     .Has patient had a PCN reaction causing immediate rash, facial/tongue/throat swelling, SOB or lightheadedness with hypotension: Unknown Has patient had a PCN reaction causing severe rash involving mucus membranes or skin necrosis: Unknown Has patient had a PCN reaction that required hospitalization: Unknown Has patient had a PCN reaction occurring within the last 10 years: Unknown If all of the above answers are "NO", then may proceed with Cephalosporin use.   Marland Kitchen Propoxyphene   . Sulfa Antibiotics     Antimicrobials this admission: 7/26  levofloxacin/Aztreonam x1 dose 7/26 vancomycin  >>  7/26 Ceftazidime>> 7/26 azithromycin>> 7/29   Dose adjustments this admission:   Microbiology results: 7/26  BCx: staph species, no mecA detected, GPC 1/2 7/26 Sputum/Respiratory: S aureus 7/26 MRSA PCR: positive  Thank you for allowing pharmacy to be a part of this patient's care.  Napoleon Form, PharmD, BCPS Clinical Pharmacist 06/25/2017 11:48 AM

## 2017-06-25 NOTE — Consult Note (Signed)
PULMONARY / CRITICAL CARE MEDICINE   Name: Tahir Blank MRN: [id number] DOB: 1956-03-07    ADMISSION DATE:  06/21/2017  HISTORY OF PRESENT ILLNESS:   Level 5 caveat  PT PROFILE: 61 y.o. M with multiple medical problems including schizophrenia, hypothyroidism, rheumatoid arthritis, seizure disorder. He is a nursing home resident. He was found by staff unresponsive, bradycardic, hypothermic. Brought to the [facility] ED where he was intubated for decreased level of consciousness. Treated for rated cardia and hypothermia in the ED. Initial CXR reveals extensive left-sided infiltrate.   MAJOR EVENTS/TEST RESULTS: 07/26 admission as above 07/26 CT head: Negative 07/26 Hematology consultation for thrombocytopenia 07/29 very brief episode of PEA arrest  INDWELLING DEVICES:: ETT 07/26 >>  R IJ CVL 07/27 >>   MICRO DATA: MRSA PCR 07/26 >> positive Resp 07/27 >> MSSA Blood 07/26 >> 1/2 coag negative staph Strep Ag 07/26 >> negative  PCT protocol 07/26: < 0.10 X 3  ANTIMICROBIALS: Vanc 07/26 >> 07/30 Azithro 07/26 >> 07/30 Ceftaz 07/26 >>    SUBJECTIVE:  RASS -1, not F/C  VITAL SIGNS: BP 98/75 (BP Location: Right Arm)   Pulse 78   Temp 98.2 F (36.8 C) (Core (Comment))   Resp 14   Ht 6\' 1"  (1.854 m)   Wt (!) 328 lb 7.8 oz (149 kg)   SpO2 98%   BMI 43.34 kg/m   HEMODYNAMICS:    VENTILATOR SETTINGS: Vent Mode: PRVC FiO2 (%):  [30 %-40 %] 30 % Set Rate:  [12 bmp-18 bmp] 12 bmp Vt Set:  [500 mL] 500 mL PEEP:  [5 cmH20] 5 cmH20 Plateau Pressure:  [15 cmH20-21 cmH20] 15 cmH20  INTAKE / OUTPUT: I/O last 3 completed shifts: In: 8058.3 [I.V.:4645.8; NG/GT:2240; IV Piggyback:1172.5] Out: 5093 [Urine:5460; Emesis/NG output:75]  PHYSICAL EXAMINATION: General: RASS -1, not F/C Neuro: EOMI, PERRLA, MAEs HEENT: NCAT, sclerae white Cardiovascular:  Regular, no murmurs Lungs: Coarse bilateral breath sounds, no wheezes Abdomen: Markedly obese, soft, not overtly tender, BS  present Extremities: 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, symmetric pedal edema  LABS:  BMET  Recent Labs Lab 06/23/17 0113 06/24/17 0433 06/25/17 0135  NA 142 140 139  K 3.7 4.3 4.8  CL 108 103 102  CO2 30 31 33*  BUN 18 26* 29*  CREATININE 0.63 0.67 0.54*  GLUCOSE 89 75 82    Electrolytes  Recent Labs Lab 06/23/17 0113 06/23/17 0422 06/23/17 1700 06/24/17 0433 06/25/17 0135  CALCIUM 8.2*  --   --  8.0* 8.1*  MG  --  2.5* 2.2 2.3 1.9  PHOS  --  4.2 4.2  --  4.0    CBC  Recent Labs Lab 06/23/17 0113 06/24/17 0433 06/25/17 0135  WBC 5.2 5.2 5.1  HGB 10.0* 9.4* 9.1*  HCT 30.1* 27.9* 27.4*  PLT 17* 16* 17*    Coag's  Recent Labs Lab 06/21/17 1138 06/22/17 0330  APTT  --  48*  INR 1.10  --     Sepsis Markers  Recent Labs Lab 06/21/17 1138 06/21/17 1620 06/22/17 0330 06/23/17 0113 06/23/17 0155  LATICACIDVEN 1.5  --   --   --  1.0  PROCALCITON  --  <0.10 <0.10 <0.10  --     ABG  Recent Labs Lab 06/21/17 1200 06/23/17 0110 06/25/17 0135  PHART 7.47* 7.42 7.42  PCO2ART 49* 51* 60*  PO2ART 75* 68* 79*    Liver Enzymes  Recent Labs Lab 06/22/17 0330 06/23/17 0113 06/25/17 0135  AST 31  21 17  ALT 33 25 17  ALKPHOS 71 66 59  BILITOT 0.6 0.5 0.4  ALBUMIN 2.4* 2.1* 2.1*    Cardiac Enzymes  Recent Labs Lab 06/22/17 0330 06/25/17 0135 06/25/17 0736  TROPONINI <0.03 <0.03 <0.03    Glucose  Recent Labs Lab 06/24/17 2334 06/25/17 0137 06/25/17 0423 06/25/17 0714 06/25/17 1118 06/25/17 1628  GLUCAP 84 73 81 85 84 76    CXR: [place]  ASSESSMENT / PLAN:  PULMONARY A: Acute hypoxemic respiratory failure Likely chronic 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 Extensive left lung infiltrate - likely HCAP VDRF - intubated predominantly due to altered mental status Difficult airway per report P:   Cont vent support - settings reviewed and/or adjusted Cont vent bundle Daily SBT  if/when meets criteria   Will need to begin discussions re: goals of care and whether trach placement is appropriate  CARDIOVASCULAR A:  Sinus bradycardia , resolved Septic shock Brief PEA arrest 07/30 P:  Telemetry monitoring Cycle cardiac monitors Continue norepinephrine to maintain MAP > 65 mmHg Check echocardiogram 07/30  RENAL A:   Mild hypernatremia, resolved Chronic compensatory metabolic alkalosis  P:   Monitor BMET intermittently Monitor I/Os Correct electrolytes as indicated   GASTROINTESTINAL A:   No acute issues P:   SUP: Enteral PPI Continue TF protocol  HEMATOLOGIC A:   Leukopenia, resolved Thrombocytopenia P:  DVT px: SCDs Monitor CBC intermittently Transfuse per usual guidelines Discontinue potentially offending medications (valproic acid in particular)  INFECTIOUS A:   Severe sepsis HCAP P:   Monitor temp, WBC count Micro and abx as above   ENDOCRINE A:   Hypothyroidism - TSH mildly elevated Hypothermia, resolved P:   Change levothyroxine to enteral Warming blanket  NEUROLOGIC A:   Acute encephalopathy Psychiatric history ICU/vent associated discomfort P:   RASS goal: 0, -1 PAD protocol, fentanyl infusion and prn midazolam Discontinue chronic psychiatric medications Haloperidol as needed for severe agitation   FAMILY: Patient's niece has been contacted. I will meet with her later in the week.   CCM time: 40 mins  Merton Border, MD PCCM service Mobile [phone] Pager [phone]  06/25/2017, 4:59 PM

## 2017-06-25 NOTE — Progress Notes (Signed)
Pt. Was being given a bath when monitor showed no HR but a NSR rhythm, but was unresponsive and RN was unable to palpate a pulse. Code called, CPR started <1 min. Pt. Opened eyes, HR in 90's, pulse obtained. APP bedside. Will continue to monitor pt. Closely.

## 2017-06-25 NOTE — Progress Notes (Signed)
Nutrition Follow-up  DOCUMENTATION CODES:   Obesity unspecified  INTERVENTION:  1. Vital High Protein at 1mL/hr 2. Pro-stat 74mL TID Regimen provides 1920 calories, 206gm protein, 1132mL free water With propofol at 4.18mL/hr (119 calories from lipids) provides 2039 calories PEPUP protocol  NUTRITION DIAGNOSIS:   Inadequate oral intake related to inability to eat as evidenced by NPO status. -ongoing  GOAL:   Provide needs based on ASPEN/SCCM guidelines -meeting currently  MONITOR:   I & O's, Labs, TF tolerance, Weight trends, Skin  REASON FOR ASSESSMENT:   Ventilator    ASSESSMENT:   61 yo male PMHHx Schizophrenia, HTN, BPH, Chronic Pain syndrome, bed bound, obesity, AKI, COPD, CHF was found unresponsive, bradycardic, hypothermic, intubated in ED with HCAP, severe sepsis  Discussed in rounds. Failed SBT this morning. Now on propofol for sedation while removing many other meds. TF was not infusing during visit, awaiting replacement from RN.  Intake/Output Summary (Last 24 hours) at 06/25/17 1251 Last data filed at 06/25/17 1127  Gross per 24 hour  Intake          4601.05 ml  Output             4610 ml  Net            -8.95 ml   Weight up 28 lbs. Since admission 11L Fluid Positive Patient is currently intubated on ventilator support MV: 9 L/min Temp (24hrs), Avg:99.1 F (37.3 C), Min:97.3 F (36.3 C), Max:100 F (37.8 C) Propofol: 4.25ml/hr --> 119 calories Labs reviewed Medications reviewed and include:  Novolog 2-6 Units every 4 Hours Free water 118mL every 8 hours Fentanyl gtt, Levo gtt Versed PRN  Diet Order:     Skin:  Wound (see comment) (Stage 1 to left and right glute, to sacrum)  Last BM:  PTA  Height:   Ht Readings from Last 1 Encounters:  06/21/17 6\' 1"  (1.854 m)    Weight:   Wt Readings from Last 1 Encounters:  06/25/17 (!) 328 lb 7.8 oz (149 kg)    Ideal Body Weight:  83.63 kg  BMI:  Body mass index is 43.34  kg/m.  Estimated Nutritional Needs:   Kcal:  1103-1594 (11-14 cal/kg ABW)  Protein:  >/= 209 grams (2.5g/kg IBW)  Fluid:  Per MD/NP/PA  EDUCATION NEEDS:   Education needs no appropriate at this time  Satira Anis. Kloie Whiting, MS, RD LDN Inpatient Clinical Dietitian Pager [phone]

## 2017-06-25 NOTE — Progress Notes (Addendum)
Pt. Responsive to voice, able to control anxiety mostly with calm reorientation from staff.  Versed PRN x 2, ativan x1 (ativan not as helpful)  Pt. Unable to follow commands, very stiff- even with PROM stretching Q 4. Pt. Continues to tolerate TF, and levo titrated down by half.  Pt. Had a brief episode of PEA, with < 1 min of CPR- see previous notes. UOP on average 100 mL/h Report given to [facility]

## 2017-06-25 NOTE — Progress Notes (Signed)
Called to the bedside for pulselessness. Patient was being given a bath when suddenly he became unresponsive and pulseless. CPR was initiated and continued for <1 minutes as patient opened his eyes and started moving his extremities. A doppler pulse and blood pressure were obtained. He is currently on levophed at 12 mcg.  A/P STAT Labs and EKG Rest of treatment plan unchanged Several unsuccessful attempts made to contact patient's family. Message left for [place]. [facility] ANP-BC Pulmonary and Critical Care Medicine [facility] Pager [phone] or [phone]

## 2017-06-26 ENCOUNTER — Inpatient Hospital Stay (HOSPITAL_COMMUNITY)
Admit: 2017-06-26 | Discharge: 2017-06-26 | Disposition: A | Discharge status: Home / Self Care | Payer: Medicaid Other | Attending: Pulmonary Disease | Admitting: Pulmonary Disease

## 2017-06-26 ENCOUNTER — Inpatient Hospital Stay: Payer: Medicaid Other

## 2017-06-26 DIAGNOSIS — D6959 Other secondary thrombocytopenia: Secondary | ICD-10-CM

## 2017-06-26 DIAGNOSIS — R6521 Severe sepsis with septic shock: Secondary | ICD-10-CM

## 2017-06-26 DIAGNOSIS — R680 Hypothermia, not associated with low environmental temperature: Secondary | ICD-10-CM

## 2017-06-26 DIAGNOSIS — A419 Sepsis, unspecified organism: Secondary | ICD-10-CM

## 2017-06-26 DIAGNOSIS — J9601 Acute respiratory failure with hypoxia: Secondary | ICD-10-CM

## 2017-06-26 LAB — MAGNESIUM: Magnesium: 2.2 mg/dL (ref 1.7–2.4)

## 2017-06-26 LAB — GLUCOSE, CAPILLARY
GLUCOSE-CAPILLARY: 89 mg/dL (ref 65–99)
GLUCOSE-CAPILLARY: 91 mg/dL (ref 65–99)
GLUCOSE-CAPILLARY: 92 mg/dL (ref 65–99)
GLUCOSE-CAPILLARY: 94 mg/dL (ref 65–99)

## 2017-06-26 LAB — BASIC METABOLIC PANEL
ANION GAP: 5 (ref 5–15)
BUN: 27 mg/dL — ABNORMAL HIGH (ref 6–20)
CHLORIDE: 102 mmol/L (ref 101–111)
CO2: 35 mmol/L — ABNORMAL HIGH (ref 22–32)
Calcium: 8.3 mg/dL — ABNORMAL LOW (ref 8.9–10.3)
Creatinine, Ser: 0.49 mg/dL — ABNORMAL LOW (ref 0.61–1.24)
GFR calc non Af Amer: >60 mL/min (ref >60)
Glucose, Bld: 101 mg/dL — ABNORMAL HIGH (ref 65–99)
POTASSIUM: 3.9 mmol/L (ref 3.5–5.1)
SODIUM: 142 mmol/L (ref 135–145)

## 2017-06-26 LAB — CBC
HEMATOCRIT: 26 % — AB (ref 40.0–52.0)
HEMOGLOBIN: 8.7 g/dL — AB (ref 13.0–18.0)
MCH: 29.7 pg (ref 26.0–34.0)
MCHC: 33.3 g/dL (ref 32.0–36.0)
MCV: 89.2 fL (ref 80.0–100.0)
Platelets: 20 10*3/uL — CL (ref 150–440)
RBC: 2.91 MIL/uL — AB (ref 4.40–5.90)
RDW: 17.3 % — AB (ref 11.5–14.5)
WBC: 6.8 10*3/uL (ref 3.8–10.6)

## 2017-06-26 LAB — CULTURE, BLOOD (ROUTINE X 2)
Culture: NO GROWTH
Special Requests: ADEQUATE

## 2017-06-26 LAB — TROPONIN I: Troponin I: 0.04 ng/mL (ref <0.03)

## 2017-06-26 LAB — TSH: TSH: 3.39 u[IU]/mL (ref 0.350–4.500)

## 2017-06-26 LAB — ECHOCARDIOGRAM COMPLETE
HEIGHTINCHES: 73 in
Weight: 5255.77 oz

## 2017-06-26 LAB — PHOSPHORUS: PHOSPHORUS: 4 mg/dL (ref 2.5–4.6)

## 2017-06-26 MED ORDER — DEXTROSE 5 % IV SOLN
INTRAVENOUS | Status: DC
  Administered 2017-06-26 – 2017-06-27 (×2): via INTRAVENOUS

## 2017-06-26 MED ORDER — LEVOTHYROXINE SODIUM 100 MCG IV SOLR
75.0000 ug | Freq: Every day | INTRAVENOUS | Status: DC
  Administered 2017-06-26 – 2017-06-28 (×3): 75 ug via INTRAVENOUS
  Filled 2017-06-26 (×3): qty 5

## 2017-06-26 MED ORDER — FUROSEMIDE 10 MG/ML IJ SOLN
40.0000 mg | Freq: Once | INTRAMUSCULAR | Status: AC
  Administered 2017-06-26: 40 mg via INTRAVENOUS
  Filled 2017-06-26: qty 4

## 2017-06-26 MED ORDER — PANTOPRAZOLE SODIUM 40 MG IV SOLR
40.0000 mg | INTRAVENOUS | Status: DC
Start: 2017-06-26 — End: 2017-06-27
  Administered 2017-06-26: 40 mg via INTRAVENOUS
  Filled 2017-06-26: qty 40

## 2017-06-26 MED ORDER — MIDAZOLAM HCL 2 MG/2ML IJ SOLN
4.0000 mg | Freq: Once | INTRAMUSCULAR | Status: AC
  Administered 2017-06-26: 4 mg via INTRAVENOUS

## 2017-06-26 MED ORDER — FENTANYL BOLUS VIA INFUSION
100.0000 ug | INTRAVENOUS | Status: AC
  Administered 2017-06-26: 100 ug via INTRAVENOUS

## 2017-06-26 MED ORDER — POTASSIUM CHLORIDE 20 MEQ/15ML (10%) PO SOLN
40.0000 meq | Freq: Once | ORAL | Status: AC
  Administered 2017-06-26: 40 meq
  Filled 2017-06-26 (×2): qty 30

## 2017-06-26 NOTE — Progress Notes (Signed)
Nutrition Follow-up  DOCUMENTATION CODES:   Obesity unspecified  INTERVENTION:   RD will order supplements when diet advanced to help pt meet estimated protein needs.   NUTRITION DIAGNOSIS:   Inadequate oral intake related to inability to eat as evidenced by NPO status. -ongoing  GOAL:   Patient will meet greater than or equal to 90% of their needs -not meeting  MONITOR:   Diet advancement, Labs, Weight trends, I & O's    ASSESSMENT:   61 yo male PMHHx Schizophrenia, HTN, BPH, Chronic Pain syndrome, bed bound, obesity, AKI, COPD, CHF was found unresponsive, bradycardic, hypothermic, intubated in ED with HCAP, severe sepsis  Pt extubated today; tolerating well. Per chart, pt with weight gain since admit. No diet today; pt remains NPO. RD will order supplements when diet advanced.    Medications reviewed and include: synthroid, protonix, miralax, ceftazidine, levophed, dulcolax  Labs reviewed: CO2 25(H), BUN 27(H), creat 0.49(L), Ca 8.3(L), P 4.0 wnl, Mg 2.2 wnl Triglycerides- 62 Hgb 8.7(L), Hct 26.0(L)  Diet Order:  Diet NPO time specified  Skin:  Wound (see comment) (Stage 1 to left and right glute, to sacrum)  Last BM:  7/31- type 7  Height:   Ht Readings from Last 1 Encounters:  06/21/17 6\' 1"  (1.854 m)    Weight:   Wt Readings from Last 1 Encounters:  06/25/17 (!) 328 lb 7.8 oz (149 kg)    Ideal Body Weight:  83.63 kg  BMI:  Body mass index is 43.34 kg/m.  Estimated Nutritional Needs:   Kcal:  2500-2800kcal/day   Protein:  137-165g/day   Fluid:  >2.5L/day   EDUCATION NEEDS:   Education needs no appropriate at this time  Koleen Distance MS, RD, [place] Pager #[phone] After Hours Pager: [phone]

## 2017-06-26 NOTE — Progress Notes (Signed)
PULMONARY / CRITICAL CARE MEDICINE   Name: Jerry [name] MRN: [id number] DOB: 12-28-1955    ADMISSION DATE:  06/21/2017   PT PROFILE: 61 y.o. M with multiple medical problems including schizophrenia, hypothyroidism, rheumatoid arthritis, seizure disorder. He is a nursing home resident. He was found by staff unresponsive, bradycardic, hypothermic. Brought to the [facility] ED where he was intubated for decreased level of consciousness. Treated for rated cardia and hypothermia in the ED. Initial CXR reveals extensive left-sided infiltrate.   MAJOR EVENTS/TEST RESULTS: 07/26 admission as above 07/26 CT head: Negative 07/26 Hematology consultation for thrombocytopenia 07/30 very brief episode of PEA arrest 07/31 passed SBT, extubated 07/31 echocardiogram:   INDWELLING DEVICES:: ETT 07/26 >> 07/31 R IJ CVL 07/27 >>   MICRO DATA: MRSA PCR 07/26 >> positive Resp 07/27 >> MSSA Blood 07/26 >> 1/2 coag negative staph Strep Ag 07/26 >> negative  PCT protocol 07/26: < 0.10 X 3  ANTIMICROBIALS: Vanc 07/26 >> 07/30 Azithro 07/26 >> 07/30 Ceftaz 07/26 >>    SUBJECTIVE:  RASS , + F/C. passed SBT. Extubated. Cough is marginal. Tolerating extubation  VITAL SIGNS: BP 114/68 (BP Location: Right Arm)   Pulse 83   Temp 98.8 F (37.1 C) (Core (Comment))   Resp 11   Ht 6\' 1"  (1.854 m)   Wt (!) 328 lb 7.8 oz (149 kg)   SpO2 95%   BMI 43.34 kg/m   HEMODYNAMICS:    VENTILATOR SETTINGS: Vent Mode: PSV FiO2 (%):  [30 %-40 %] 30 % Set Rate:  [12 bmp] 12 bmp Vt Set:  [500 mL] 500 mL PEEP:  [5 cmH20] 5 cmH20 Pressure Support:  [0 cmH20-5 cmH20] 5 cmH20 Plateau Pressure:  [7 cmH20-21 cmH20] 12 cmH20  INTAKE / OUTPUT: I/O last 3 completed shifts: In: 4807.4 [I.V.:2087.4; NG/GT:2312.5; IV Piggyback:407.5] Out: 4470 [Urine:4470]  PHYSICAL EXAMINATION: General: RASS 0, intermittently F/C Neuro: EOMI, PERRLA, MAEs HEENT: NCAT, sclerae severely injected Cardiovascular:  Regular, no  murmurs Lungs: Scattered rhonchi, no wheezes Abdomen: Markedly obese, soft, not overtly tender, BS present Extremities: RA changes, symmetric pedal edema  LABS:  BMET  Recent Labs Lab 06/24/17 0433 06/25/17 0135 06/26/17 0614  NA 140 139 142  K 4.3 4.8 3.9  CL 103 102 102  CO2 31 33* 35*  BUN 26* 29* 27*  CREATININE 0.67 0.54* 0.49*  GLUCOSE 75 82 101*    Electrolytes  Recent Labs Lab 06/23/17 1700 06/24/17 0433 06/25/17 0135 06/26/17 0614  CALCIUM  --  8.0* 8.1* 8.3*  MG 2.2 2.3 1.9 2.2  PHOS 4.2  --  4.0 4.0    CBC  Recent Labs Lab 06/24/17 0433 06/25/17 0135 06/26/17 0614  WBC 5.2 5.1 6.8  HGB 9.4* 9.1* 8.7*  HCT 27.9* 27.4* 26.0*  PLT 16* 17* 20*    Coag's  Recent Labs Lab 06/21/17 1138 06/22/17 0330  APTT  --  48*  INR 1.10  --     Sepsis Markers  Recent Labs Lab 06/21/17 1138 06/21/17 1620 06/22/17 0330 06/23/17 0113 06/23/17 0155  LATICACIDVEN 1.5  --   --   --  1.0  PROCALCITON  --  <0.10 <0.10 <0.10  --     ABG  Recent Labs Lab 06/21/17 1200 06/23/17 0110 06/25/17 0135  PHART 7.47* 7.42 7.42  PCO2ART 49* 51* 60*  PO2ART 75* 68* 79*    Liver Enzymes  Recent Labs Lab 06/22/17 0330 06/23/17 0113 06/25/17 0135  AST 31 21 17   ALT 33 25 17  ALKPHOS 71 66 59  BILITOT 0.6 0.5 0.4  ALBUMIN 2.4* 2.1* 2.1*    Cardiac Enzymes  Recent Labs Lab 06/25/17 0135 06/25/17 0736 06/26/17 0614  TROPONINI <0.03 <0.03 0.04*    Glucose  Recent Labs Lab 06/25/17 1118 06/25/17 1628 06/25/17 2005 06/26/17 0022 06/26/17 0408 06/26/17 0722  GLUCAP 84 76 74 94 89 91    CXR: Interstitial edema  ASSESSMENT / PLAN:  PULMONARY A: Acute hypoxemic respiratory failure Suspected chronic 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 Extensive left lung infiltrate - suspected HCAP Ventilator dependence Difficult airway per report Marginal cough P:   Monitor in ICU post extubation Supplemental oxygen to maintain SPO2  90-95% NTS PRN BiPAP as needed   CARDIOVASCULAR A:  Sinus bradycardia , resolved Septic shock Brief PEA arrest 07/30 P:  Telemetry monitoring Cycle cardiac monitors Continue norepinephrine to maintain MAP > 60 mmHg Check echocardiogram 07/31  RENAL A:   Mild hypernatremia, resolved Chronic compensatory metabolic alkalosis  P:   Monitor BMET intermittently Monitor I/Os Correct electrolytes as indicated   GASTROINTESTINAL A:   Dysphagia postextubation P:   SUP: IV PPI NPO  HEMATOLOGIC A:   Leukopenia, resolved Thrombocytopenia, stable to improved P:  DVT px: SCDs Monitor CBC intermittently Transfuse per usual guidelines Have discontinued potentially offending medications (valproic acid in particular)  INFECTIOUS A:   Severe sepsis, resolving Suspected HCAP P:   Monitor temp, WBC count Micro and abx as above  Will complete 7 days of antibiotics  ENDOCRINE A:   Hypothyroidism - TSH mildly elevated Hypothermia, resolved P:   Change levothyroxine to IV until able to take oral meds  NEUROLOGIC A:   Acute encephalopathy, improving Psychiatric history P:   RASS goal: 0 Minimize all sedating meds Holding chronic psychiatric medications Continue Haloperidol as needed for severe agitation   FAMILY: Patient's niece has been contacted. I will meet with her later in the week.   CCM time: 40 mins including multiple rechecks post extubation  Merton Border, MD PCCM service Mobile [phone] Pager [phone]  06/26/2017, 10:49 AM

## 2017-06-26 NOTE — Progress Notes (Signed)
[place] [facility] [place] NAME: Jerry [name]    MR#:  [id number]  DATE OF BIRTH:  [birth year]  SUBJECTIVE:  CHIEF COMPLAINT:   Chief Complaint  Patient presents with  . Code Sepsis   -Remains intubated and sedated. Sedation held this morning. Patient resting with eyes open, no agitation at this time -Still requiring minimal amount of Levophed. Spontaneous breathing trial today  REVIEW OF SYSTEMS:  Review of Systems  Unable to perform ROS: Critical illness    DRUG ALLERGIES:   Allergies  Allergen Reactions  . Methadone   . Penicillins     .Has patient had a PCN reaction causing immediate rash, facial/tongue/throat swelling, SOB or lightheadedness with hypotension: Unknown Has patient had a PCN reaction causing severe rash involving mucus membranes or skin necrosis: Unknown Has patient had a PCN reaction that required hospitalization: Unknown Has patient had a PCN reaction occurring within the last 10 years: Unknown If all of the above answers are "NO", then may proceed with Cephalosporin use.   Marland Kitchen Propoxyphene   . Sulfa Antibiotics     VITALS:  Blood pressure 106/67, pulse 62, temperature 98.8 F (37.1 C), resp. rate 12, height 6\' 1"  (1.854 m), weight (!) 149 kg (328 lb 7.8 oz), SpO2 94 %.  PHYSICAL EXAMINATION:  Physical Exam  GENERAL:  61 y.o.-year-old Obese patient lying in the bed, sedated, not in any acute distress.  EYES: Pupils equal, round, reactive to light and accommodation. No scleral icterus. Erythematous conjunctiva. Extraocular muscles intact.  HEENT: Head atraumatic, normocephalic. Oropharynx and nasopharynx clear. Increased oral secretions. Has an OG tube and endotracheal tube in NECK:  Supple, no jugular venous distention. No thyroid enlargement, no tenderness.  LUNGS: Normal breath sounds bilaterally, no wheezing, rales or crepitation. No use of accessory muscles of respiration. Decreased bibasilar breath sounds  with some rhonchi CARDIOVASCULAR: S1, S2 normal. No murmurs, rubs, or gallops.  ABDOMEN: Soft, obese, nontender, nondistended. Bowel sounds present. No organomegaly or mass.  EXTREMITIES: No cyanosis, or clubbing. 1+ pedal edema, chronic hyperpigmentation of both feet but dorsalis pedis palpable bilaterally, gangrene appearing NEUROLOGIC: . Sedated this morning. Able to move all 4 extremities when sedation is decreased. Nonpurposeful movements, not following commands PSYCHIATRIC: The patient is on sedation.  SKIN: No obvious rash, lesion, or ulcer.    LABORATORY PANEL:   CBC  Recent Labs Lab 06/26/17 0614  WBC 6.8  HGB 8.7*  HCT 26.0*  PLT 20*   ------------------------------------------------------------------------------------------------------------------  Chemistries   Recent Labs Lab 06/25/17 0135 06/26/17 0614  NA 139 142  K 4.8 3.9  CL 102 102  CO2 33* 35*  GLUCOSE 82 101*  BUN 29* 27*  CREATININE 0.54* 0.49*  CALCIUM 8.1* 8.3*  MG 1.9 2.2  AST 17  --   ALT 17  --   ALKPHOS 59  --   BILITOT 0.4  --    ------------------------------------------------------------------------------------------------------------------  Cardiac Enzymes  Recent Labs Lab 06/26/17 0614  TROPONINI 0.04*   ------------------------------------------------------------------------------------------------------------------  RADIOLOGY:  Dg Abd 1 View  Result Date: 06/26/2017 CLINICAL DATA:  Constipation. EXAM: ABDOMEN - 1 VIEW COMPARISON:  KUB 06/21/2017 .  AP pelvis 10 02/14/2011 . FINDINGS: Soft tissue structures are unremarkable. No bowel distention. Moderate stool noted throughout the colon. No free air. Lumbar spine scoliosis concave left. 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 Destructive changes noted of the left femoral head. Protrusio acetabula  and destructive changes left femoral head are new from  prior study 09/19/2011 . IMPRESSION: 1.  Moderate stool volume.  No bowel distention. 2. 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 Infectious/inflammatory arthropathy cannot be excluded . These findings are new from prior AP pelvis study of 09/19/2011. Electronically Signed   By: Marcello Moores  Register   On: 06/26/2017 06:17   Dg Chest Port 1 View  Result Date: 06/26/2017 CLINICAL DATA:  Central line placement. EXAM: PORTABLE CHEST 1 VIEW COMPARISON:  06/26/2017 . FINDINGS: Endotracheal tube and NG tube in stable position. Interim has been advanced, its tip is at the cavoatrial junction. Cardiomegaly with mild pulmonary interstitial prominence . Cardiomegaly with mild bilateral interstitial prominence. Small left pleural effusion. No change from prior exam . No pneumothorax . IMPRESSION: 1. In advancement of right IJ line, its tip is at the cavoatrial junction. Endotracheal tube and NG tube in stable position. 2. Persistent cardiomegaly with mild bilateral interstitial prominence consistent interstitial edema. Small left pleural effusion. No change from prior exam. Electronically Signed   By: Marcello Moores  Register   On: 06/26/2017 06:13   Dg Chest Port 1 View  Result Date: 06/26/2017 CLINICAL DATA:  Central line placement. EXAM: PORTABLE CHEST 1 VIEW COMPARISON:  06/25/2017 FINDINGS: Endotracheal tube with tip measuring 5.5 cm above the carina. Enteric tube tip is off the field of view but below the left hemidiaphragm. The right central venous catheter has been retracted. The tip is now projected over the lower cervical spine, likely still in the jugular vein. Advancement or repositioning is suggested for better positioning. Shallow inspiration. Atelectasis or infiltration in the lung bases increasing perihilar opacities may represent progressing edema or pneumonia. Mild cardiac enlargement. No pneumothorax. IMPRESSION: Right central venous catheter tip is over the base of the neck, likely  still in the jugular vein. Repositioning or advancement is recommended for better placement. Appliances otherwise in satisfactory position. Cardiac enlargement. Progressing infiltrates or atelectasis in the perihilar regions and lung bases. This could represent progressing edema or pneumonia. These results will be called to the ordering clinician or representative by the Radiologist Assistant, and communication documented in the PACS or zVision Dashboard. Electronically Signed   By: Lucienne Capers M.D.   On: 06/26/2017 02:24   Dg Chest Port 1 View  Result Date: 06/25/2017 CLINICAL DATA:  Acute respiratory failure EXAM: PORTABLE CHEST 1 VIEW COMPARISON:  06/23/2017 FINDINGS: Endotracheal tube tip measures 4.7 cm above the carina. Right central venous catheter tip over the low SVC region. No pneumothorax. Enteric tube tip is off the field of view but under the left hemidiaphragm. Cardiac enlargement. No vascular congestion. Left perihilar and right basilar infiltrates may represent edema or pneumonia. Small bilateral pleural effusions. No pneumothorax. Calcification of the aorta. IMPRESSION: Appliances appear in satisfactory position. Cardiac enlargement. Left hilar and right basilar infiltrates may represent edema or pneumonia. Small bilateral pleural effusions. Electronically Signed   By: Lucienne Capers M.D.   On: 06/25/2017 02:02    EKG:   Orders placed or performed during the hospital encounter of 06/21/17  . EKG 12-Lead  . EKG 12-Lead  . EKG 12-Lead  . EKG 12-Lead  . EKG 12-Lead  . EKG 12-Lead  . EKG 12-Lead  . EKG 12-Lead  . EKG 12-Lead  . EKG 12-Lead    ASSESSMENT AND PLAN:   62 year old obese male with past medical history significant for schizophrenia, seizure disorder, hypothyroidism, rheumatoid arthritis from a nursing home was brought in secondary to  unresponsive state, hypothermia.  #1 sepsis-secondary to healthcare acquired pneumonia. -one set of blood cultures with  coagulase-negative staphylococcus, likely contaminant.. Remains on vancomycin and Fortaz -Blood pressure is borderline, requiring Levophed. On 67mcg -However procalcitonin is negative  - Management per ICU team -Since blood pressure is low, his blood pressure medications including Norvasc, Coreg, clonidine, torsemide are currently held  #2 acute respiratory failure-secondary to healthcare acquired pneumonia. Ventilator dependent respiratory failure at this time. -Management per ICU team. -chest x-ray with left lower lobe infiltrate, follow-up chest x-ray with some improvement in the left upper lobe noted but worsening at the bases -Increased respiratory oropharyngeal and nasopharyngeal secretions. Could benefit from scopolamine  patch -Continue inhalers and IV antibiotics. -On vent. Sedated with fentanyl and propofol. -Spontaneous breathing trial every day per protocol  #3 acute thrombocytopenia-likely sepsis related.platelet count slowly improving and at 20 K -could also be medication induced- was on valproic acid, Prolixin -Appreciate oncology consult. Monitor closely. No indication for transfusion at this time.  #4 hypothyroidism- continue synthroid  #5 schizophrenia-was on Prolixin & valproic acid, -Trazodone, Klonopin - currently are on hold. On propofol for sedation and fentanyl  #6 DVT prophylaxis- Ted's and SCDs for now, no heparin products due to thrombocytopenia  Family meeting with POA on Friday with the intensivist    All the records are reviewed and case discussed with Care Management/Social Workerr. Management plans discussed with the patient, family and they are in agreement.  CODE STATUS: Full code  TOTAL TIME TAKING CARE OF THIS PATIENT: 28 minutes.    Gladstone Lighter M.D on 06/26/2017 at 8:51 AM  Between 7am to 6pm - Pager - [phone]  After 6pm go to [email or web address] - password [email or web address] [place]  Office  [phone]  CC: Primary  care physician; Patient, No Pcp Per

## 2017-06-26 NOTE — Procedures (Signed)
Central Venous Catheter Insertion Procedure Note Babak Lucus [id number] [birth year]  Procedure:Replacement of dislodged Central Venous Catheter over guidewire Indications: Assessment of intravascular volume, Drug and/or fluid administration, Frequent blood sampling and Right IJ dislodged from the tip of the SVC to the lower part of teh neck per CXR  Procedure Details Consent: Unable to obtain consent because of emergent medical necessity. Time Out: Verified patient identification, verified procedure, site/side was marked, verified correct patient position, special equipment/implants available, medications/allergies/relevent history reviewed, required imaging and test results available.  Performed  Maximum sterile technique was used including antiseptics, cap, gloves, gown, hand hygiene, mask and sheet. Skin prep and old catheter prep: Chlorhexidine; no local anesthetic administered A antimicrobial bonded/coated triple lumen catheter was placed in the right internal jugular vein using the Seldinger technique in the same location as the old one.  Evaluation Blood flow good Complications: No apparent complications Patient did tolerate procedure well. Chest X-ray ordered to verify placement.  CXR: normal. .Procedure performed under direct supervision of Dr. Alva Garnet. Ultrasound utilized for realtime vessel cannulation  Magdalene S. [facility] ANP-BC Pulmonary and Critical Care Medicine [facility] Pager [phone] or [phone] 06/26/2017, 6:07 AM   Merton Border, MD PCCM service Mobile [phone] Pager [phone] 06/26/2017 10:49 AM

## 2017-06-26 NOTE — Progress Notes (Signed)
Patient extubated and placed on 4lpm Arab.  Patient tolerated well.  HR 88 saturations 97% RR 12.

## 2017-06-26 NOTE — Progress Notes (Signed)
0730 Troponin 0.04 from am labs.   Notified Dr Alva Garnet.   No orders received.  Marguarite Arbour, RN

## 2017-06-26 NOTE — Progress Notes (Signed)
*  PRELIMINARY RESULTS* Echocardiogram 2D Echocardiogram has been performed.  Sherrie Sport 06/26/2017, 2:27 PM

## 2017-06-26 NOTE — Progress Notes (Signed)
Pt with pulse ox dropping into the low 80s.  Lowest witnessed pulse ox was 78% on nasal cannula.  Pt noted to be mouth breathing.  Non rebreather placed on patient.  Maintaining pulse ox 00%.   Notified respiratory to evaluate.

## 2017-06-26 NOTE — Progress Notes (Addendum)
Pharmacy Antibiotic Note  Jerry [name] is a 61 y.o. male admitted on 06/21/2017 with Sepsis secondary to HCAP.  Pharmacy has been consulted for  ceftazidime dosing.  Plan: Continue  ceftazidime 2g IV every 8 hours. After discussion with Dr. Alva Garnet, will plan on a total of 7 days of therapy with a stop date of 8/1.  Height: 6\' 1"  (185.4 cm) Weight: (!) 328 lb 7.8 oz (149 kg) IBW/kg (Calculated) : 79.9  Temp (24hrs), Avg:98.8 F (37.1 C), Min:98.2 F (36.8 C), Max:99.7 F (37.6 C)   Recent Labs Lab 06/21/17 1138  06/22/17 0330 06/22/17 0618 06/22/17 1831 06/23/17 0010 06/23/17 0113 06/23/17 0155 06/23/17 0422 06/24/17 0433 06/25/17 0135 06/26/17 0614  WBC 2.8*  < >  --  7.3  --   --  5.2  --   --  5.2 5.1 6.8  CREATININE 0.53*  --  0.70  --   --   --  0.63  --   --  0.67 0.54* 0.49*  LATICACIDVEN 1.5  --   --   --   --   --   --  1.0  --   --   --   --   VANCOTROUGH  --   --   --   --  60* 36*  --   --   --   --   --   --   VANCORANDOM  --   --   --   --   --   --   --   --  33 11  --   --   < > = values in this interval not displayed.  Estimated Creatinine Clearance: 147.4 mL/min (A) (by C-G formula based on SCr of 0.49 mg/dL (L)).    Allergies  Allergen Reactions  . Methadone   . Penicillins     .Has patient had a PCN reaction causing immediate rash, facial/tongue/throat swelling, SOB or lightheadedness with hypotension: Unknown Has patient had a PCN reaction causing severe rash involving mucus membranes or skin necrosis: Unknown Has patient had a PCN reaction that required hospitalization: Unknown Has patient had a PCN reaction occurring within the last 10 years: Unknown If all of the above answers are "NO", then may proceed with Cephalosporin use.   Marland Kitchen Propoxyphene   . Sulfa Antibiotics     Antimicrobials this admission: 7/26 levofloxacin/Aztreonam x1 dose 7/26 vancomycin  >> 7/30 7/26 Ceftazidime>>  7/26 azithromycin>> 7/29   Dose adjustments this  admission:   Microbiology results: 7/26  BCx: staph species, no mecA detected, GPC 1/2 7/26 Sputum/Respiratory: MSSA 7/26 MRSA PCR: positive  Thank you for allowing pharmacy to be a part of this patient's care.  Napoleon Form, PharmD, BCPS Clinical Pharmacist 06/26/2017 12:21 PM

## 2017-06-26 NOTE — Progress Notes (Signed)
[facility] Hematology/Oncology Progress Note  Date of admission: 06/21/2017  Hospital day:  06/26/2017  Chief Complaint: Jerry [name] is a 61 y.o. male with schizophrenia, rheumatoid arthritis, and hypthyroidism who was admitted through the emergency room with presumed sepsis.  Subjective:  Patient extubated and follows commands.  Speaks a few words.  Social History: The patient is alone today.  Allergies:  Allergies  Allergen Reactions  . Methadone   . Penicillins     .Has patient had a PCN reaction causing immediate rash, facial/tongue/throat swelling, SOB or lightheadedness with hypotension: Unknown Has patient had a PCN reaction causing severe rash involving mucus membranes or skin necrosis: Unknown Has patient had a PCN reaction that required hospitalization: Unknown Has patient had a PCN reaction occurring within the last 10 years: Unknown If all of the above answers are "NO", then may proceed with Cephalosporin use.   Marland Kitchen Propoxyphene   . Sulfa Antibiotics     Scheduled Medications: . albuterol  2.5 mg Nebulization Q6H  . chlorhexidine gluconate (MEDLINE KIT)  15 mL Mouth Rinse BID  . levothyroxine  75 mcg Intravenous Daily  . mouth rinse  15 mL Mouth Rinse 10 times per day  . pantoprazole (PROTONIX) IV  40 mg Intravenous Q24H  . polyethylene glycol  17 g Per NG tube QHS    Review of Systems: Unable to obtained as patient interactive, but does not answer questions copnsistently.  Physical Exam: Blood pressure 124/73, pulse 98, temperature 99 F (37.2 C), temperature source Core (Comment), resp. rate 19, height '6\' 1"'  (1.854 m), weight (!) 328 lb 7.8 oz (149 kg), SpO2 94 %.  GENERAL:  Well developed, well nourished, overweight gentleman lying comfortably in the ICU in no acute distress. MENTAL STATUS: Alert.  Interactive.  Know he is "here". HEAD:  Normocephalic, atraumatic, face symmetric, no Cushingoid features. EYES:  Brown eyes.  Scleral  hemorrhage (left > right).  Pupils equal.  No cleral icterus. ENT:  Tongue normal. Mucous membranes moist.  RESPIRATORY:  Coarse upper airway sounds.  No wheezes. CARDIOVASCULAR:  Regular rate and rhythm without murmur, rub or gallop. ABDOMEN:  Soft, non-tender, with active bowel sounds, and no appreciable hepatosplenomegaly.  No masses. SKIN:  Grade I sacral pressure ulcer per nursing.   EXTREMITIES: Arthritic changes in hands. Bilateral foot drop.  ICD in place. NEUROLOGICAL: Follow commands.  Answers some questions.   Results for orders placed or performed during the hospital encounter of 06/21/17 (from the past 48 hour(s))  Glucose, capillary     Status: None   Collection Time: 06/24/17  7:51 PM  Result Value Ref Range   Glucose-Capillary 74 65 - 99 mg/dL  Glucose, capillary     Status: None   Collection Time: 06/24/17 11:34 PM  Result Value Ref Range   Glucose-Capillary 84 65 - 99 mg/dL  Comprehensive metabolic panel     Status: Abnormal   Collection Time: 06/25/17  1:35 AM  Result Value Ref Range   Sodium 139 135 - 145 mmol/L   Potassium 4.8 3.5 - 5.1 mmol/L   Chloride 102 101 - 111 mmol/L   CO2 33 (H) 22 - 32 mmol/L   Glucose, Bld 82 65 - 99 mg/dL   BUN 29 (H) 6 - 20 mg/dL   Creatinine, Ser 0.54 (L) 0.61 - 1.24 mg/dL   Calcium 8.1 (L) 8.9 - 10.3 mg/dL   Total Protein 5.6 (L) 6.5 - 8.1 g/dL   Albumin 2.1 (L) 3.5 - 5.0 g/dL  AST 17 15 - 41 U/L   ALT 17 17 - 63 U/L   Alkaline Phosphatase 59 38 - 126 U/L   Total Bilirubin 0.4 0.3 - 1.2 mg/dL   GFR calc non Af Amer >60 >60 mL/min   GFR calc Af Amer >60 >60 mL/min    Comment: (NOTE) The eGFR has been calculated using the CKD EPI equation. This calculation has not been validated in all clinical situations. eGFR's persistently <60 mL/min signify possible Chronic Kidney Disease.    Anion gap 4 (L) 5 - 15  CBC     Status: Abnormal   Collection Time: 06/25/17  1:35 AM  Result Value Ref Range   WBC 5.1 3.8 - 10.6 K/uL    RBC 3.07 (L) 4.40 - 5.90 MIL/uL   Hemoglobin 9.1 (L) 13.0 - 18.0 g/dL   HCT 27.4 (L) 40.0 - 52.0 %   MCV 89.0 80.0 - 100.0 fL   MCH 29.7 26.0 - 34.0 pg   MCHC 33.3 32.0 - 36.0 g/dL   RDW 17.5 (H) 11.5 - 14.5 %   Platelets 17 (LL) 150 - 440 K/uL    Comment: RESULT REPEATED AND VERIFIED PLATELET COUNT CONFIRMED BY SMEAR CRITICAL VALUE NOTED.  VALUE IS CONSISTENT WITH PREVIOUSLY REPORTED AND CALLED VALUE.   Magnesium     Status: None   Collection Time: 06/25/17  1:35 AM  Result Value Ref Range   Magnesium 1.9 1.7 - 2.4 mg/dL  Phosphorus     Status: None   Collection Time: 06/25/17  1:35 AM  Result Value Ref Range   Phosphorus 4.0 2.5 - 4.6 mg/dL  Troponin I     Status: None   Collection Time: 06/25/17  1:35 AM  Result Value Ref Range   Troponin I <0.03 <0.03 ng/mL  Blood gas, arterial     Status: Abnormal   Collection Time: 06/25/17  1:35 AM  Result Value Ref Range   FIO2 0.30    Delivery systems VENTILATOR    Mode PRESSURE REGULATED VOLUME CONTROL    VT 500 mL   LHR 18 resp/min   Peep/cpap 5.0 cm H20   pH, Arterial 7.42 7.350 - 7.450   pCO2 arterial 60 (H) 32.0 - 48.0 mmHg   pO2, Arterial 79 (L) 83.0 - 108.0 mmHg   Bicarbonate 38.9 (H) 20.0 - 28.0 mmol/L   Acid-Base Excess 11.9 (H) 0.0 - 2.0 mmol/L   O2 Saturation 95.8 %   Patient temperature 37.0    Collection site RIGHT RADIAL    Sample type ARTERIAL DRAW    Allens test (pass/fail) PASS PASS  Glucose, capillary     Status: None   Collection Time: 06/25/17  1:37 AM  Result Value Ref Range   Glucose-Capillary 73 65 - 99 mg/dL  Glucose, capillary     Status: None   Collection Time: 06/25/17  4:23 AM  Result Value Ref Range   Glucose-Capillary 81 65 - 99 mg/dL  Glucose, capillary     Status: None   Collection Time: 06/25/17  7:14 AM  Result Value Ref Range   Glucose-Capillary 85 65 - 99 mg/dL  Troponin I     Status: None   Collection Time: 06/25/17  7:36 AM  Result Value Ref Range   Troponin I <0.03 <0.03 ng/mL   Glucose, capillary     Status: None   Collection Time: 06/25/17 11:18 AM  Result Value Ref Range   Glucose-Capillary 84 65 - 99 mg/dL  Triglycerides  Status: None   Collection Time: 06/25/17 12:25 PM  Result Value Ref Range   Triglycerides 62 <150 mg/dL  Glucose, capillary     Status: None   Collection Time: 06/25/17  4:28 PM  Result Value Ref Range   Glucose-Capillary 76 65 - 99 mg/dL  Glucose, capillary     Status: None   Collection Time: 06/25/17  8:05 PM  Result Value Ref Range   Glucose-Capillary 74 65 - 99 mg/dL  Glucose, capillary     Status: None   Collection Time: 06/26/17 12:22 AM  Result Value Ref Range   Glucose-Capillary 94 65 - 99 mg/dL  Glucose, capillary     Status: None   Collection Time: 06/26/17  4:08 AM  Result Value Ref Range   Glucose-Capillary 89 65 - 99 mg/dL  Troponin I     Status: Abnormal   Collection Time: 06/26/17  6:14 AM  Result Value Ref Range   Troponin I 0.04 (HH) <0.03 ng/mL    Comment: CRITICAL RESULT CALLED TO, READ BACK BY AND VERIFIED WITH SHAUNA HEDRICK ON 06/26/17 AT 0726 QSD   TSH     Status: None   Collection Time: 06/26/17  6:14 AM  Result Value Ref Range   TSH 3.390 0.350 - 4.500 uIU/mL    Comment: Performed by a 3rd Generation assay with a functional sensitivity of <=0.01 uIU/mL.  Basic metabolic panel     Status: Abnormal   Collection Time: 06/26/17  6:14 AM  Result Value Ref Range   Sodium 142 135 - 145 mmol/L   Potassium 3.9 3.5 - 5.1 mmol/L   Chloride 102 101 - 111 mmol/L   CO2 35 (H) 22 - 32 mmol/L   Glucose, Bld 101 (H) 65 - 99 mg/dL   BUN 27 (H) 6 - 20 mg/dL   Creatinine, Ser 0.49 (L) 0.61 - 1.24 mg/dL   Calcium 8.3 (L) 8.9 - 10.3 mg/dL   GFR calc non Af Amer >60 >60 mL/min   GFR calc Af Amer >60 >60 mL/min    Comment: (NOTE) The eGFR has been calculated using the CKD EPI equation. This calculation has not been validated in all clinical situations. eGFR's persistently <60 mL/min signify possible Chronic  Kidney Disease.    Anion gap 5 5 - 15  CBC     Status: Abnormal   Collection Time: 06/26/17  6:14 AM  Result Value Ref Range   WBC 6.8 3.8 - 10.6 K/uL   RBC 2.91 (L) 4.40 - 5.90 MIL/uL   Hemoglobin 8.7 (L) 13.0 - 18.0 g/dL   HCT 26.0 (L) 40.0 - 52.0 %   MCV 89.2 80.0 - 100.0 fL   MCH 29.7 26.0 - 34.0 pg   MCHC 33.3 32.0 - 36.0 g/dL   RDW 17.3 (H) 11.5 - 14.5 %   Platelets 20 (LL) 150 - 440 K/uL    Comment: RESULT REPEATED AND VERIFIED CRITICAL VALUE NOTED.  VALUE IS CONSISTENT WITH PREVIOUSLY REPORTED AND CALLED VALUE.   Magnesium     Status: None   Collection Time: 06/26/17  6:14 AM  Result Value Ref Range   Magnesium 2.2 1.7 - 2.4 mg/dL  Phosphorus     Status: None   Collection Time: 06/26/17  6:14 AM  Result Value Ref Range   Phosphorus 4.0 2.5 - 4.6 mg/dL  Glucose, capillary     Status: None   Collection Time: 06/26/17  7:22 AM  Result Value Ref Range   Glucose-Capillary 91 65 -  99 mg/dL   Dg Abd 1 View  Result Date: 06/26/2017 CLINICAL DATA:  Constipation. EXAM: ABDOMEN - 1 VIEW COMPARISON:  KUB 06/21/2017 .  AP pelvis 10 02/14/2011 . FINDINGS: Soft tissue structures are unremarkable. No bowel distention. Moderate stool noted throughout the colon. No free air. Lumbar spine scoliosis concave left. 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 Destructive changes noted of the left femoral head. Protrusio acetabula and destructive changes left femoral head are new from prior study 09/19/2011 . IMPRESSION: 1.  Moderate stool volume.  No bowel distention. 2. 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 Infectious/inflammatory arthropathy cannot be excluded . These findings are new from prior AP pelvis study of 09/19/2011. Electronically Signed   By: Marcello Moores  Register   On: 06/26/2017 06:17   Dg Chest Port 1 View  Result Date: 06/26/2017 CLINICAL DATA:  Central line placement. EXAM:  PORTABLE CHEST 1 VIEW COMPARISON:  06/26/2017 . FINDINGS: Endotracheal tube and NG tube in stable position. Interim has been advanced, its tip is at the cavoatrial junction. Cardiomegaly with mild pulmonary interstitial prominence . Cardiomegaly with mild bilateral interstitial prominence. Small left pleural effusion. No change from prior exam . No pneumothorax . IMPRESSION: 1. In advancement of right IJ line, its tip is at the cavoatrial junction. Endotracheal tube and NG tube in stable position. 2. Persistent cardiomegaly with mild bilateral interstitial prominence consistent interstitial edema. Small left pleural effusion. No change from prior exam. Electronically Signed   By: Marcello Moores  Register   On: 06/26/2017 06:13   Dg Chest Port 1 View  Result Date: 06/26/2017 CLINICAL DATA:  Central line placement. EXAM: PORTABLE CHEST 1 VIEW COMPARISON:  06/25/2017 FINDINGS: Endotracheal tube with tip measuring 5.5 cm above the carina. Enteric tube tip is off the field of view but below the left hemidiaphragm. The right central venous catheter has been retracted. The tip is now projected over the lower cervical spine, likely still in the jugular vein. Advancement or repositioning is suggested for better positioning. Shallow inspiration. Atelectasis or infiltration in the lung bases increasing perihilar opacities may represent progressing edema or pneumonia. Mild cardiac enlargement. No pneumothorax. IMPRESSION: Right central venous catheter tip is over the base of the neck, likely still in the jugular vein. Repositioning or advancement is recommended for better placement. Appliances otherwise in satisfactory position. Cardiac enlargement. Progressing infiltrates or atelectasis in the perihilar regions and lung bases. This could represent progressing edema or pneumonia. These results will be called to the ordering clinician or representative by the Radiologist Assistant, and communication documented in the PACS or zVision  Dashboard. Electronically Signed   By: Lucienne Capers M.D.   On: 06/26/2017 02:24   Dg Chest Port 1 View  Result Date: 06/25/2017 CLINICAL DATA:  Acute respiratory failure EXAM: PORTABLE CHEST 1 VIEW COMPARISON:  06/23/2017 FINDINGS: Endotracheal tube tip measures 4.7 cm above the carina. Right central venous catheter tip over the low SVC region. No pneumothorax. Enteric tube tip is off the field of view but under the left hemidiaphragm. Cardiac enlargement. No vascular congestion. Left perihilar and right basilar infiltrates may represent edema or pneumonia. Small bilateral pleural effusions. No pneumothorax. Calcification of the aorta. IMPRESSION: Appliances appear in satisfactory position. Cardiac enlargement. Left hilar and right basilar infiltrates may represent edema or pneumonia. Small bilateral pleural effusions. Electronically Signed   By: Lucienne Capers M.D.   On: 06/25/2017 02:02    Assessment:  Jerry [name] is a 61 y.o. male with schizophrenia admitted to the ICU unresponsive s/p intubation.  He remains bradycardic and hypothermic.  Presentation is c/w sepsis.  CXR revealed pneumonia.  Lactic acid was normal.    Peripheral smear revealed leukopenia and thrombocytopenia.  There were no schistocytes.  Plan:   1. Hematology:  Thrombocytopenia felt secondary to sepsis and limited marrow reserve.  Platelet count has improved slightly from 17,000 to 20,000.  PT and fibrinogen are normal.  No evidence of DIC.  No evidence of TTP/HUS.  No bleeding.  Transfuse platelets if needed.  Normal labs include hepatitis B core antibody, hepatitis C antibody, HIV testing, ANA, and TSH.  Transfuse platelets as needed.  Discussed with patient's niece, his medical power of attorney.  Patient on medications which can be associated with thrombocytopenia (valproic acid 1-27% dose related), Prolixin (leukopenia and thrombocytopenia), and Plaquenil (agranulocytosis and thrombocytopenia).  Valproic acid,  Prolixin, and Plaquenil discontinued.  Hematocrit slowly drifting down.  Maintain active type and screen.  Check retic.  2.  Infectious disease: Pneumonia.  Patient on ceftazidime and vancomycin.  MRSA positive. Blood culture and ID panel + methicillin susceptible coagulase negative staph.  Recent UTI on Macrobid.     3.  Pulmonary:  Patient weaned off ventilator.  Left sided pneumonia.    4.  Cardiovascular:  Patient off Levophed. Bradycardia resolved.  5.  Neurology:  Patient interactive.  Baseline is unclear.  Follows commands.    Lequita Asal, MD  06/26/2017, 4:43 PM

## 2017-06-27 ENCOUNTER — Inpatient Hospital Stay: Payer: Medicaid Other

## 2017-06-27 LAB — RETICULOCYTES
RBC.: 3.08 MIL/uL — ABNORMAL LOW (ref 4.40–5.90)
RETIC COUNT ABSOLUTE: 52.4 10*3/uL (ref 19.0–183.0)
RETIC CT PCT: 1.7 % (ref 0.4–3.1)

## 2017-06-27 LAB — CBC
HCT: 27.1 % — ABNORMAL LOW (ref 40.0–52.0)
Hemoglobin: 9.2 g/dL — ABNORMAL LOW (ref 13.0–18.0)
MCH: 29.9 pg (ref 26.0–34.0)
MCHC: 34 g/dL (ref 32.0–36.0)
MCV: 88.1 fL (ref 80.0–100.0)
PLATELETS: 30 10*3/uL — AB (ref 150–440)
RBC: 3.08 MIL/uL — AB (ref 4.40–5.90)
RDW: 16.7 % — AB (ref 11.5–14.5)
WBC: 5.5 10*3/uL (ref 3.8–10.6)

## 2017-06-27 LAB — BASIC METABOLIC PANEL
Anion gap: 5 (ref 5–15)
BUN: 10 mg/dL (ref 6–20)
CALCIUM: 8.3 mg/dL — AB (ref 8.9–10.3)
CO2: 33 mmol/L — ABNORMAL HIGH (ref 22–32)
CREATININE: 0.55 mg/dL — AB (ref 0.61–1.24)
Chloride: 102 mmol/L (ref 101–111)
GFR calc non Af Amer: >60 mL/min (ref >60)
Glucose, Bld: 85 mg/dL (ref 65–99)
Potassium: 3.8 mmol/L (ref 3.5–5.1)
SODIUM: 140 mmol/L (ref 135–145)

## 2017-06-27 LAB — TYPE AND SCREEN
ABO/RH(D): B POS
Antibody Screen: NEGATIVE

## 2017-06-27 MED ORDER — FUROSEMIDE 10 MG/ML IJ SOLN
40.0000 mg | Freq: Once | INTRAMUSCULAR | Status: AC
  Administered 2017-06-27: 40 mg via INTRAVENOUS
  Filled 2017-06-27: qty 4

## 2017-06-27 MED ORDER — POTASSIUM CHLORIDE 2 MEQ/ML IV SOLN
INTRAVENOUS | Status: DC
  Administered 2017-06-27: 10:00:00 via INTRAVENOUS
  Filled 2017-06-27 (×4): qty 1000

## 2017-06-27 NOTE — Evaluation (Signed)
Physical Therapy Evaluation Patient Details Name: Jerry [name] MRN: [id number] DOB: [birth year] Today's Date: 06/27/2017   History of Present Illness   61 y.o. male  with a known history of Schizophrenia, hypothyroidism, hypertension, BPH, chronic pain syndrome, pressure sores, bedbound, obesity, COPD, CHF, rheumatoid arthritis, who presents to the hospital 7/26 after he was found to be unresponsive in the facility where he resides. He was hypothermic, unresponsive, bradycardic.  Pt was extubated 7/31.  Clinical Impression  Difficult to fully assess pt secondary to limited communication and lethargy (also apparently he has not had his psych meds for 5 days secondary to intubation).  Pt displayed rheumatoid deformities t/o and lacked full ROM in most joints.  Pt with very inconsistent ability to participate with U&LE exercises, nursing reports that he has occasionally lifted at least 1 leg and has done some ankle pumps, very little AROM with PT.  Pt indicates that he does not need a lift at baseline and can get to his w/c, though unsure as to the accuracy of his limited PLOF reports.     Follow Up Recommendations SNF (per progress)    Equipment Recommendations       Recommendations for Other Services       Precautions / Restrictions Precautions Precautions: Fall Restrictions Weight Bearing Restrictions: No      Mobility  Bed Mobility               General bed mobility comments: deferred, pt too inconsistent with ability to follow instructions and unable to answer about true baseline to attempt mobility/sitting/etc  Transfers                    Ambulation/Gait                Stairs            Wheelchair Mobility    Modified Rankin (Stroke Patients Only)       Balance                                             Pertinent Vitals/Pain Pain Assessment:  (unable to rate, shows some hesitation with PROM )    Home Living  Family/patient expects to be discharged to:: Skilled nursing facility                 Additional Comments: Resident at [facility]    Prior Function Level of [place]: Needs assistance         Comments: Pt was unable to report functional mobility at baseline, is seems that he is largely bed bound, but he talked about a w/c and not needing a lift to get to it...?     Hand Dominance        Extremity/Trunk Assessment   Upper Extremity Assessment Upper Extremity Assessment: Generalized weakness (RA limits most joint full ROM, very little AROM b/l)    Lower Extremity Assessment Lower Extremity Assessment: Generalized weakness (RA limits most joint full ROM, very little AROM b/l)       Communication   Communication: Expressive difficulties (intermittent good attempt at speaking, largely distant look)  Cognition Arousal/Alertness: Lethargic   Overall Cognitive Status: History of cognitive impairments - at baseline  General Comments: inconsistent effort and affect, showed some participation      General Comments      Exercises General Exercises - Lower Extremity Ankle Circles/Pumps: PROM;10 reps (no AROM with either toes or ankles b/l) Short Arc Quad: PROM;5 reps Heel Slides: PROM;10 reps (occasional fleeting show of effort, ROM limited 2/2 arthriti) Hip ABduction/ADduction: PROM;10 reps Straight Leg Raises: PROM;5 reps   Assessment/Plan    PT Assessment Patient needs continued PT services  PT Problem List Decreased strength;Decreased range of motion;Decreased activity tolerance;Decreased balance;Decreased mobility;Decreased cognition;Decreased coordination;Decreased knowledge of use of DME;Decreased safety awareness;Obesity       PT Treatment Interventions Functional mobility training;Therapeutic activities;Therapeutic exercise;Balance training;Neuromuscular re-education;Cognitive remediation;Patient/family  education;Wheelchair mobility training    PT Goals (Current goals can be found in the Care Plan section)  Acute Rehab PT Goals Patient Stated Goal: none stated PT Goal Formulation: With patient Time For Goal Achievement: 07/11/17 Potential to Achieve Goals: Fair    Frequency  (3 day trial)   Barriers to discharge        Co-evaluation               AM-PAC PT "6 Clicks" Daily Activity  Outcome Measure Difficulty turning over in bed (including adjusting bedclothes, sheets and blankets)?: Total Difficulty moving from lying on back to sitting on the side of the bed? : Total Difficulty sitting down on and standing up from a chair with arms (e.g., wheelchair, bedside commode, etc,.)?: Total Help needed moving to and from a bed to chair (including a wheelchair)?: Total Help needed walking in hospital room?: Total Help needed climbing 3-5 steps with a railing? : Total 6 Click Score: 6    End of Session   Activity Tolerance: Patient limited by fatigue;Patient limited by lethargy     PT Visit Diagnosis: Muscle weakness (generalized) (M62.81)    Time: 8563-1497 PT Time Calculation (min) (ACUTE ONLY): 23 min   Charges:   PT Evaluation $PT Eval Moderate Complexity: 1 Mod PT Treatments $Therapeutic Exercise: 8-22 mins   PT G Codes:        Kreg Shropshire, DPT 06/27/2017, 1:53 PM

## 2017-06-27 NOTE — Progress Notes (Signed)
Chaplain was making rounds and visited with pt in [place]. Chaplain provided [employer] of prayer and a pastoral presence.    06/27/17 1055  Clinical Encounter Type  Visited With Patient  Visit Type Initial;Spiritual support  Referral From Nurse  Consult/Referral To Chaplain  Spiritual Encounters  Spiritual Needs Prayer

## 2017-06-27 NOTE — Progress Notes (Signed)
PULMONARY / CRITICAL CARE MEDICINE   Name: Jerry [name] MRN: [id number] DOB: 17-Oct-1956    ADMISSION DATE:  06/21/2017   PT PROFILE: 61 y.o. M with multiple medical problems including schizophrenia, hypothyroidism, rheumatoid arthritis, seizure disorder. He is a nursing home resident. He was found by staff unresponsive, bradycardic, hypothermic. Brought to the [facility] ED where he was intubated for decreased level of consciousness. Treated for rated cardia and hypothermia in the ED. Initial CXR reveals extensive left-sided infiltrate.   MAJOR EVENTS/TEST RESULTS: 07/26 admission as above 07/26 CT head: Negative 07/26 Hematology consultation for thrombocytopenia 07/30 very brief episode of PEA arrest 07/31 passed SBT, extubated 07/31 echocardiogram: LVEF 65-70%. Moderate LVH. LA mildly to moderately dilated. 08/01 cognition much improved. Tolerating extubation. Off vasopressors  INDWELLING DEVICES:: ETT 07/26 >> 07/31 R IJ CVL 07/27 >>   MICRO DATA: MRSA PCR 07/26 >> positive Resp 07/27 >> MSSA Blood 07/26 >> 1/2 coag negative staph Strep Ag 07/26 >> negative  PCT protocol 07/26: < 0.10 X 3  ANTIMICROBIALS: Vanc 07/26 >> 07/30 Azithro 07/26 >> 07/30 Ceftaz 07/26 >> 08/01   SUBJECTIVE:  RASS 0, + F/C. cognition improved. Cough improved. Denies pain. Denies shortness of breath.  VITAL SIGNS: BP 124/79 (BP Location: Right Arm)   Pulse 99   Temp 99.7 F (37.6 C)   Resp 11   Ht 6\' 1"  (1.854 m)   Wt (!) 325 lb (147.4 kg)   SpO2 98%   BMI 42.88 kg/m   HEMODYNAMICS:    VENTILATOR SETTINGS: FiO2 (%):  [40 %] 40 %  INTAKE / OUTPUT: I/O last 3 completed shifts: In: 2463.6 [I.V.:1186.1; NG/GT:1127.5; IV Piggyback:150] Out: 6203 [Urine:4950; Emesis/NG output:3]  PHYSICAL EXAMINATION: General: RASS 0, + F/C Neuro: EOMI, PERRLA, MAEs HEENT: NCAT, sclerae severely injected R>L Cardiovascular:  Regular, no murmurs Lungs: Scattered rhonchi, no wheezes Abdomen: Markedly  obese, soft, not overtly tender, BS present Extremities: RA changes, minimal symmetric pedal edema  LABS:  BMET  Recent Labs Lab 06/25/17 0135 06/26/17 0614 06/27/17 0137  NA 139 142 140  K 4.8 3.9 3.8  CL 102 102 102  CO2 33* 35* 33*  BUN 29* 27* 10  CREATININE 0.54* 0.49* 0.55*  GLUCOSE 82 101* 85    Electrolytes  Recent Labs Lab 06/23/17 1700 06/24/17 0433 06/25/17 0135 06/26/17 0614 06/27/17 0137  CALCIUM  --  8.0* 8.1* 8.3* 8.3*  MG 2.2 2.3 1.9 2.2  --   PHOS 4.2  --  4.0 4.0  --     CBC  Recent Labs Lab 06/25/17 0135 06/26/17 0614 06/27/17 0137  WBC 5.1 6.8 5.5  HGB 9.1* 8.7* 9.2*  HCT 27.4* 26.0* 27.1*  PLT 17* 20* 30*    Coag's  Recent Labs Lab 06/21/17 1138 06/22/17 0330  APTT  --  48*  INR 1.10  --     Sepsis Markers  Recent Labs Lab 06/21/17 1138 06/21/17 1620 06/22/17 0330 06/23/17 0113 06/23/17 0155  LATICACIDVEN 1.5  --   --   --  1.0  PROCALCITON  --  <0.10 <0.10 <0.10  --     ABG  Recent Labs Lab 06/21/17 1200 06/23/17 0110 06/25/17 0135  PHART 7.47* 7.42 7.42  PCO2ART 49* 51* 60*  PO2ART 75* 68* 79*    Liver Enzymes  Recent Labs Lab 06/22/17 0330 06/23/17 0113 06/25/17 0135  AST 31 21 17   ALT 33 25 17  ALKPHOS 71 66 59  BILITOT 0.6 0.5 0.4  ALBUMIN 2.4* 2.1*  2.1*    Cardiac Enzymes  Recent Labs Lab 06/25/17 0135 06/25/17 0736 06/26/17 0614  TROPONINI <0.03 <0.03 0.04*    Glucose  Recent Labs Lab 06/25/17 1628 06/25/17 2005 06/26/17 0022 06/26/17 0408 06/26/17 0722 06/26/17 1119  GLUCAP 76 74 94 89 91 92    CXR: NSC edema pattern  ASSESSMENT / PLAN:  PULMONARY A: Acute hypoxemic respiratory failure Suspected chronic 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 Extensive left lung infiltrate - suspected HCAP Ventilator dependence, resolved Difficult airway per report Pulmonary edema P:   Change to SDU status Continue supplemental oxygen to maintain SPO2 90-95% Continue  NTS PRN Continue BiPAP as needed Furosemide 1 08/01   CARDIOVASCULAR A:  Sinus bradycardia , resolved Septic shock Brief PEA arrest 09/23 Likely diastolic heart failure P:  Continue telemetry monitoring Continue norepinephrine to maintain MAP > 60 mmHg Check echocardiogram 07/31  RENAL A:   Mild hypernatremia, resolved Chronic compensatory metabolic alkalosis  P:   Monitor BMET intermittently Monitor I/Os Correct electrolytes as indicated   GASTROINTESTINAL A:   Dysphagia post extubation P:   SUP: N/I post extubation SLP evaluation 08/01  HEMATOLOGIC A:   Leukopenia, resolved Thrombocytopenia, improving P:  DVT px: SCDs Monitor CBC intermittently Transfuse per usual guidelines Continue to hold potentially offending medications (valproic acid in particular)  INFECTIOUS A:   Severe sepsis, resolved Suspected HCAP, treated P:   Monitor temp, WBC count Micro and abx as above   ENDOCRINE A:   Hypothyroidism - TSH now normal Hypothermia, resolved P:   Continue IV levothyroxine until able to take oral medications  NEUROLOGIC A:   Acute encephalopathy, resolved Psychiatric history P:   RASS goal: 0 Minimize all sedating meds Holding chronic psychiatric medications Continue Haloperidol as needed for severe agitation   FAMILY: Patient's niece has been contacted. I will meet with her later in the week.   Merton Border, MD PCCM service Mobile [phone] Pager [phone]  06/27/2017, 12:48 PM

## 2017-06-27 NOTE — Progress Notes (Signed)
[facility] [place] NAME: Jerry [name]    MR#:  [id number]  DATE OF BIRTH:  11/22/1956  SUBJECTIVE:  CHIEF COMPLAINT:   Chief Complaint  Patient presents with  . Code Sepsis   -Patient is extubated yesterday, appears more alert and comfortable. Hypoxic overnight requiring nonrebreather mask. -This morning on 5 L oxygen. Trying to communicate well  REVIEW OF SYSTEMS:  Review of Systems  Constitutional: Positive for malaise/fatigue. Negative for chills and fever.  HENT: Negative for congestion, ear discharge, hearing loss and nosebleeds.   Respiratory: Positive for cough and shortness of breath. Negative for wheezing.   Cardiovascular: Positive for leg swelling. Negative for chest pain and palpitations.  Gastrointestinal: Negative for abdominal pain, constipation, diarrhea, nausea and vomiting.  Genitourinary: Negative for dysuria.  Neurological: Negative for dizziness, speech change, focal weakness, seizures and headaches.  Psychiatric/Behavioral: Negative for depression.    DRUG ALLERGIES:   Allergies  Allergen Reactions  . Methadone   . Penicillins     .Has patient had a PCN reaction causing immediate rash, facial/tongue/throat swelling, SOB or lightheadedness with hypotension: Unknown Has patient had a PCN reaction causing severe rash involving mucus membranes or skin necrosis: Unknown Has patient had a PCN reaction that required hospitalization: Unknown Has patient had a PCN reaction occurring within the last 10 years: Unknown If all of the above answers are "NO", then may proceed with Cephalosporin use.   Marland Kitchen Propoxyphene   . Sulfa Antibiotics     VITALS:  Blood pressure 112/76, pulse 92, temperature 99.3 F (37.4 C), temperature source Core (Comment), resp. rate 17, height 6\' 1"  (1.854 m), weight (!) 147.4 kg (325 lb), SpO2 97 %.  PHYSICAL EXAMINATION:  Physical Exam  GENERAL:  61 y.o.-year-old Obese patient lying in  the bed, Alert, extubated and not in any acute distress  EYES: Pupils equal, round, reactive to light and accommodation. No scleral icterus. Erythematous conjunctiva. Extraocular muscles intact.  HEENT: Head atraumatic, normocephalic. Oropharynx and nasopharynx clear.  NECK:  Supple, no jugular venous distention. No thyroid enlargement, no tenderness.  LUNGS: Normal breath sounds bilaterally, no wheezing, rales or crepitation. No use of accessory muscles of respiration. Decreased bibasilar breath sounds with some rhonchi CARDIOVASCULAR: S1, S2 normal. No murmurs, rubs, or gallops.  ABDOMEN: Soft, obese, nontender, nondistended. Bowel sounds present. No organomegaly or mass.  EXTREMITIES: No cyanosis, or clubbing. 1+ pedal edema, chronic hyperpigmentation of both feet but dorsalis pedis palpable bilaterally, gangrene appearing NEUROLOGIC: Alert today, able to move all 4 extremities and following simple commands. Sensation is intact. Gait not tested PSYCHIATRIC: The patient is alert and oriented to self  SKIN: No obvious rash, lesion, or ulcer.    LABORATORY PANEL:   CBC  Recent Labs Lab 06/27/17 0137  WBC 5.5  HGB 9.2*  HCT 27.1*  PLT 30*   ------------------------------------------------------------------------------------------------------------------  Chemistries   Recent Labs Lab 06/25/17 0135 06/26/17 0614 06/27/17 0137  NA 139 142 140  K 4.8 3.9 3.8  CL 102 102 102  CO2 33* 35* 33*  GLUCOSE 82 101* 85  BUN 29* 27* 10  CREATININE 0.54* 0.49* 0.55*  CALCIUM 8.1* 8.3* 8.3*  MG 1.9 2.2  --   AST 17  --   --   ALT 17  --   --   ALKPHOS 59  --   --   BILITOT 0.4  --   --    ------------------------------------------------------------------------------------------------------------------  Cardiac Enzymes  Recent Labs  Lab 06/26/17 0614  TROPONINI 0.04*    ------------------------------------------------------------------------------------------------------------------  RADIOLOGY:  Dg Abd 1 View  Result Date: 06/26/2017 CLINICAL DATA:  Constipation. EXAM: ABDOMEN - 1 VIEW COMPARISON:  KUB 06/21/2017 .  AP pelvis 10 02/14/2011 . FINDINGS: Soft tissue structures are unremarkable. No bowel distention. Moderate stool noted throughout the colon. No free air. Lumbar spine scoliosis concave left. 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 Destructive changes noted of the left femoral head. Protrusio acetabula and destructive changes left femoral head are new from prior study 09/19/2011 . IMPRESSION: 1.  Moderate stool volume.  No bowel distention. 2. 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 Infectious/inflammatory arthropathy cannot be excluded . These findings are new from prior AP pelvis study of 09/19/2011. Electronically Signed   By: Marcello Moores  Register   On: 06/26/2017 06:17   Dg Chest Port 1 View  Result Date: 06/27/2017 CLINICAL DATA:  Respiratory failure. EXAM: PORTABLE CHEST 1 VIEW COMPARISON:  06/26/2017. FINDINGS: Interim extubation removal of NG tube. Right IJ line stable position. Cardiomegaly with diffuse bilateral pulmonary infiltrates/ edema and bilateral pleural effusions. Slight worsening from prior exam. No pneumothorax. IMPRESSION: 1. Interim extubation and removal of NG tube. Right IJ line stable position. 2. Cardiomegaly with diffuse bilateral pulmonary infiltrates/edema and bilateral pleural effusions. Slight worsening from prior exam . Electronically Signed   By: Marcello Moores  Register   On: 06/27/2017 06:29   Dg Chest Port 1 View  Result Date: 06/26/2017 CLINICAL DATA:  Central line placement. EXAM: PORTABLE CHEST 1 VIEW COMPARISON:  06/26/2017 . FINDINGS: Endotracheal tube and NG tube in stable position. Interim has been advanced, its tip is  at the cavoatrial junction. Cardiomegaly with mild pulmonary interstitial prominence . Cardiomegaly with mild bilateral interstitial prominence. Small left pleural effusion. No change from prior exam . No pneumothorax . IMPRESSION: 1. In advancement of right IJ line, its tip is at the cavoatrial junction. Endotracheal tube and NG tube in stable position. 2. Persistent cardiomegaly with mild bilateral interstitial prominence consistent interstitial edema. Small left pleural effusion. No change from prior exam. Electronically Signed   By: Marcello Moores  Register   On: 06/26/2017 06:13   Dg Chest Port 1 View  Result Date: 06/26/2017 CLINICAL DATA:  Central line placement. EXAM: PORTABLE CHEST 1 VIEW COMPARISON:  06/25/2017 FINDINGS: Endotracheal tube with tip measuring 5.5 cm above the carina. Enteric tube tip is off the field of view but below the left hemidiaphragm. The right central venous catheter has been retracted. The tip is now projected over the lower cervical spine, likely still in the jugular vein. Advancement or repositioning is suggested for better positioning. Shallow inspiration. Atelectasis or infiltration in the lung bases increasing perihilar opacities may represent progressing edema or pneumonia. Mild cardiac enlargement. No pneumothorax. IMPRESSION: Right central venous catheter tip is over the base of the neck, likely still in the jugular vein. Repositioning or advancement is recommended for better placement. Appliances otherwise in satisfactory position. Cardiac enlargement. Progressing infiltrates or atelectasis in the perihilar regions and lung bases. This could represent progressing edema or pneumonia. These results will be called to the ordering clinician or representative by the Radiologist Assistant, and communication documented in the PACS or zVision Dashboard. Electronically Signed   By: Lucienne Capers M.D.   On: 06/26/2017 02:24    EKG:   Orders placed or performed during the hospital  encounter of 06/21/17  . EKG 12-Lead  . EKG  12-Lead  . EKG 12-Lead  . EKG 12-Lead  . EKG 12-Lead  . EKG 12-Lead  . EKG 12-Lead  . EKG 12-Lead  . EKG 12-Lead  . EKG 12-Lead    ASSESSMENT AND PLAN:   61 year old obese male with past medical history significant for schizophrenia, seizure disorder, hypothyroidism, rheumatoid arthritis from a nursing home was brought in secondary to unresponsive state, hypothermia.  #1 sepsis-secondary to healthcare acquired pneumonia. -one set of blood cultures with coagulase-negative staphylococcus, likely contaminant. -Finished vancomycin and azithromycin. Remains on Tressie Ellis which will be finished later today after 7 days total treatment. -Required pressors to keep his blood pressure elevated until today, currently off of pressors -Since blood pressure was low, his blood pressure medications including Norvasc, Coreg, clonidine, torsemide are currently held- might be restarted slowly as blood pressure is improving  #2 acute respiratory failure-secondary to healthcare acquired pneumonia. Was ventilatory dependent. Extubated, still requiring up to 5 L of oxygen at this time. -Management per ICU team. -chest x-ray with left lower lobe infiltrate, follow-up chest x-ray with pulmonary edema. -Echocardiogram with diastolic dysfunction, LVH, EF 65% -IV with one dose of IV Lasix this morning. -Continue inhalers and IV antibiotics.  #3 acute thrombocytopenia-likely sepsis related and also medication induced. -.platelet count slowly improving and at 30 K -could also be medication induced- was on valproic acid, Prolixin -Appreciate oncology consult. Monitor closely. No indication for transfusion at this time.  #4 hypothyroidism- continue synthroid  #5 schizophrenia-was on Prolixin & valproic acid, -Trazodone, Klonopin - currently are on hold. On propofol for sedation and fentanyl  #6 DVT prophylaxis- Ted's and SCDs for now, no heparin products due to  thrombocytopenia   Speech evaluation and physical therapy consults today   All the records are reviewed and case discussed with Care Management/Social Workerr. Management plans discussed with the patient, family and they are in agreement.  CODE STATUS: Full code  TOTAL TIME TAKING CARE OF THIS PATIENT: 28 minutes.    Latroya Ng M.D on 06/27/2017 at 10:11 AM  Between 7am to 6pm - Pager - [phone]  After 6pm go to [email or web address] - password [email or web address] [facility]  Office  [phone]  CC: Primary care physician; Patient, No Pcp Per

## 2017-06-27 NOTE — Progress Notes (Signed)
Bipap not needed a this time. No apparent distress noted.

## 2017-06-28 ENCOUNTER — Inpatient Hospital Stay: Payer: Medicaid Other

## 2017-06-28 LAB — CBC
HCT: 27.2 % — ABNORMAL LOW (ref 40.0–52.0)
Hemoglobin: 9.1 g/dL — ABNORMAL LOW (ref 13.0–18.0)
MCH: 29.5 pg (ref 26.0–34.0)
MCHC: 33.6 g/dL (ref 32.0–36.0)
MCV: 87.8 fL (ref 80.0–100.0)
PLATELETS: 53 10*3/uL — AB (ref 150–440)
RBC: 3.1 MIL/uL — AB (ref 4.40–5.90)
RDW: 16.5 % — AB (ref 11.5–14.5)
WBC: 5.1 10*3/uL (ref 3.8–10.6)

## 2017-06-28 LAB — BASIC METABOLIC PANEL
Anion gap: 7 (ref 5–15)
BUN: 5 mg/dL — AB (ref 6–20)
CALCIUM: 8.3 mg/dL — AB (ref 8.9–10.3)
CHLORIDE: 100 mmol/L — AB (ref 101–111)
CO2: 31 mmol/L (ref 22–32)
CREATININE: 0.48 mg/dL — AB (ref 0.61–1.24)
GFR calc non Af Amer: >60 mL/min (ref >60)
Glucose, Bld: 89 mg/dL (ref 65–99)
Potassium: 3.8 mmol/L (ref 3.5–5.1)
SODIUM: 138 mmol/L (ref 135–145)

## 2017-06-28 MED ORDER — ORAL CARE MOUTH RINSE: 15.0000 mL | Freq: Two times a day (BID) | OROMUCOSAL | Status: DC

## 2017-06-28 MED ORDER — FUROSEMIDE 10 MG/ML IJ SOLN
40.0000 mg | Freq: Once | INTRAMUSCULAR | Status: AC
  Administered 2017-06-28: 40 mg via INTRAVENOUS
  Filled 2017-06-28: qty 4

## 2017-06-28 MED ORDER — LEVOTHYROXINE SODIUM 150 MCG PO TABS
150.0000 ug | ORAL_TABLET | Freq: Every day | ORAL | Status: DC
  Administered 2017-06-29 – 2017-07-02 (×4): 150 ug via ORAL
  Filled 2017-06-28: qty 1
  Filled 2017-06-28: qty 3
  Filled 2017-06-28: qty 1
  Filled 2017-06-28 (×3): qty 3
  Filled 2017-06-28 (×2): qty 1

## 2017-06-28 NOTE — Progress Notes (Signed)
PULMONARY / CRITICAL CARE MEDICINE   Name: Jerry [name] MRN: [id number] DOB: 01-29-1956    ADMISSION DATE:  06/21/2017   PT PROFILE: 61 y.o. M with multiple medical problems including schizophrenia, hypothyroidism, rheumatoid arthritis, seizure disorder. He is a nursing home resident. He was found by staff unresponsive, bradycardic, hypothermic. Brought to the [facility] ED where he was intubated for decreased level of consciousness. Treated for rated cardia and hypothermia in the ED. Initial CXR reveals extensive left-sided infiltrate.   MAJOR EVENTS/TEST RESULTS: 07/26 admission as above 07/26 CT head: Negative 07/26 Hematology consultation for thrombocytopenia 07/30 very brief episode of PEA arrest 07/31 passed SBT, extubated 07/31 echocardiogram: LVEF 65-70%. Moderate LVH. LA mildly to moderately dilated. 08/01 cognition much improved. Tolerating extubation. Off vasopressors 08/02 cognition and orientation waxing and waning. Rattling cough. No respiratory distress. In SDU due to high risk of deterioration  INDWELLING DEVICES:: ETT 07/26 >> 07/31 R IJ CVL 07/27 >> 08/02 LUE PICC 08/02 >>   MICRO DATA: MRSA PCR 07/26 >> positive Resp 07/27 >> MSSA Blood 07/26 >> 1/2 coag negative staph Strep Ag 07/26 >> negative  PCT protocol 07/26: < 0.10 X 3  ANTIMICROBIALS: Vanc 07/26 >> 07/30 Azithro 07/26 >> 07/30 Ceftaz 07/26 >> 08/01   SUBJECTIVE:  RASS 0, + F/C intermittently. Poorly oriented. Adequate cough with some encouragement. Denies pain. Denies shortness of breath.  VITAL SIGNS: BP 129/82   Pulse (!) 104   Temp 100 F (37.8 C)   Resp 14   Ht 6\' 1"  (1.854 m)   Wt (!) 314 lb (142.4 kg)   SpO2 97%   BMI 41.43 kg/m   HEMODYNAMICS:    VENTILATOR SETTINGS:    INTAKE / OUTPUT: I/O last 3 completed shifts: In: 1872.5 [I.V.:1722.5; IV Piggyback:150] Out: 5100 [Urine:5100]  PHYSICAL EXAMINATION: General: RASS 0, + F/C Neuro: EOMI, PERRLA, MAEs HEENT: NCAT, sclerae  severely injected R>L Cardiovascular:  Regular, no murmurs Lungs: Scattered rhonchi, no wheezes Abdomen: Markedly obese, soft, not overtly tender, BS present Extremities: RA changes, minimal symmetric pedal edema  LABS:  BMET  Recent Labs Lab 06/26/17 0614 06/27/17 0137 06/28/17 0447  NA 142 140 138  K 3.9 3.8 3.8  CL 102 102 100*  CO2 35* 33* 31  BUN 27* 10 5*  CREATININE 0.49* 0.55* 0.48*  GLUCOSE 101* 85 89    Electrolytes  Recent Labs Lab 06/23/17 1700 06/24/17 0433 06/25/17 0135 06/26/17 0614 06/27/17 0137 06/28/17 0447  CALCIUM  --  8.0* 8.1* 8.3* 8.3* 8.3*  MG 2.2 2.3 1.9 2.2  --   --   PHOS 4.2  --  4.0 4.0  --   --     CBC  Recent Labs Lab 06/26/17 0614 06/27/17 0137 06/28/17 0447  WBC 6.8 5.5 5.1  HGB 8.7* 9.2* 9.1*  HCT 26.0* 27.1* 27.2*  PLT 20* 30* 53*    Coag's  Recent Labs Lab 06/22/17 0330  APTT 48*    Sepsis Markers  Recent Labs Lab 06/21/17 1620 06/22/17 0330 06/23/17 0113 06/23/17 0155  LATICACIDVEN  --   --   --  1.0  PROCALCITON <0.10 <0.10 <0.10  --     ABG  Recent Labs Lab 06/23/17 0110 06/25/17 0135  PHART 7.42 7.42  PCO2ART 51* 60*  PO2ART 68* 79*    Liver Enzymes  Recent Labs Lab 06/22/17 0330 06/23/17 0113 06/25/17 0135  AST 31 21 17   ALT 33 25 17  ALKPHOS 71 66 59  BILITOT 0.6 0.5  0.4  ALBUMIN 2.4* 2.1* 2.1*    Cardiac Enzymes  Recent Labs Lab 06/25/17 0135 06/25/17 0736 06/26/17 0614  TROPONINI <0.03 <0.03 0.04*    Glucose  Recent Labs Lab 06/25/17 1628 06/25/17 2005 06/26/17 0022 06/26/17 0408 06/26/17 0722 06/26/17 1119  GLUCAP 76 74 94 89 91 92    CXR: NNF  ASSESSMENT / PLAN:  PULMONARY A: Acute hypoxemic respiratory failure Suspected chronic 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 Extensive left lung infiltrate - suspected HCAP Ventilator dependence, resolved Difficult airway per report Pulmonary edema P:   Change to SDU status Continue supplemental  oxygen to maintain SPO2 90-95% Continue NTS PRN Continue BiPAP as needed Repeat furosemide 1 08/02   CARDIOVASCULAR A:  Sinus bradycardia, resolved Septic shock, resolved Brief PEA arrest 70/01 Diastolic heart failure Poor vascular access P:  Continue telemetry monitoring PICC ordered  RENAL A:   Mild hypernatremia, resolved Chronic compensatory metabolic alkalosis, mild P:   Monitor BMET intermittently Monitor I/Os Correct electrolytes as indicated   GASTROINTESTINAL A:   Dysphagia post extubation P:   SUP: N/I post extubation SLP evaluation 08/01 Dysphagia 1 diet initiated 08/02 Change IV meds to PO  HEMATOLOGIC A:   Leukopenia, resolved Thrombocytopenia, improving P:  DVT px: SCDs Monitor CBC intermittently Transfuse per usual guidelines Continue to hold potentially offending medications (valproic acid in particular)  INFECTIOUS A:   Severe sepsis, resolved Suspected HCAP, treated P:   Monitor temp, WBC count Micro and abx as above   ENDOCRINE A:   Hypothyroidism - TSH now normal Hypothermia, resolved P:   Continue IV levothyroxine until able to take oral medications  NEUROLOGIC A:   Acute encephalopathy, resolved Psychiatric history P:   RASS goal: 0 Minimize all sedating meds Holding chronic psychiatric medications Continue Haloperidol as needed for severe agitation   FAMILY: No family available at bedside   Merton Border, MD PCCM service Mobile [phone] Pager [phone]  06/28/2017, 2:00 PM

## 2017-06-28 NOTE — Progress Notes (Signed)
Bipap not needed at this time, no apparent respiratory distress noted.

## 2017-06-28 NOTE — Progress Notes (Signed)
Peripherally Inserted Central Catheter/Midline Placement  The IV Nurse has discussed with the patient and/or persons authorized to consent for the patient, the purpose of this procedure and the potential benefits and risks involved with this procedure.  The benefits include less needle sticks, lab draws from the catheter, and the patient may be discharged home with the catheter. Risks include, but not limited to, infection, bleeding, blood clot (thrombus formation), and puncture of an artery; nerve damage and irregular heartbeat and possibility to perform a PICC exchange if needed/ordered by physician.  Alternatives to this procedure were also discussed.  Bard Power PICC patient education guide, fact sheet on infection prevention and patient information card has been provided to patient /or left at bedside.    PICC/Midline Placement Documentation  PICC Double Lumen 73/40/37 PICC Left Cephalic 45 cm 0 cm (Active)  Indication for Insertion or Continuance of Line Poor Vasculature-patient has had multiple peripheral attempts or PIVs lasting less than 24 hours 06/28/2017 12:10 PM  Exposed Catheter (cm) 0 cm 06/28/2017 12:10 PM  Site Assessment Clean;Dry;Intact 06/28/2017 12:10 PM  Lumen #1 Status Blood return noted;Flushed;Saline locked 06/28/2017 12:10 PM  Lumen #2 Status Blood return noted;Flushed;Saline locked 06/28/2017 12:10 PM  Dressing Type Transparent 06/28/2017 12:10 PM  Dressing Status Clean;Dry;Intact;Antimicrobial disc in place 06/28/2017 12:10 PM  Dressing Intervention New dressing 06/28/2017 12:10 PM  Dressing Change Due 07/05/17 06/28/2017 12:10 PM       Aldona Lento L 06/28/2017, 12:28 PM

## 2017-06-28 NOTE — Evaluation (Signed)
Clinical/Bedside Swallow Evaluation Patient Details  Name: Jerry [name] MRN: [id number] Date of Birth: 01-Apr-1956  Today's Date: 06/28/2017 Time: SLP Start Time (ACUTE ONLY): 45 SLP Stop Time (ACUTE ONLY): 1130 SLP Time Calculation (min) (ACUTE ONLY): 60 min  Past Medical History:  Past Medical History:  Diagnosis Date  . BPH (benign prostatic hyperplasia)   . CHF (congestive heart failure) ([place])   . Chronic pain   . COPD (chronic obstructive pulmonary disease) ([place])   . Hypertension   . Morbid obesity ([place])   . Pressure ulcer   . Rheumatoid arthritis ([place])   . Schizophrenia ([place])   . Thyroid disease    Past Surgical History: No past surgical history on file. HPI:  Pt  is a 61 y.o. male with a known history of Schizophrenia, hypothyroidism, hypertension, BPH, chronic pain syndrome, pressure sores, bedbound, obesity, COPD, CHF, rheumatoid arthritis, who presents to the hospital after he was found to be unresponsive in the facility where he resides. He was hypothermic, unresponsive, bradycardic, however, had a good blood pressure. He was given Narcan with no significant improvement of his heart rate for mental status. He was intubated in the emergency room.Marland Kitchen His chest x-ray revealed pneumonia. Labs showed terms of thrombocytopenia, leukopenia, metabolic alkalosis, mild hyperglycemia, but not lactic acidosis. MD suspect septic shock and Compensated hypercapnic respiratory failure; suspect obesity hypoventilation. Pt was orally intubated for 6 days and has tolerated extubation since; remained NPO. He has been using oral swabs w/ NSG for oral hygiene. Noted CXRs. Upon talking w/ pt, he often answered w/ "[name], yeh". He smiled frequently but did answer he liked "tomatoes" and "Newports" when asked what he he liked to eat/drink at home. Pt did not follow commands consistently and did not produce a volitional cough response when asked.    Assessment / Plan / Recommendation Clinical Impression  Pt  appears to present w/ adequate oropharyngeal phase swallow function w/ trials of thin liquids and purees given; no overt s/s of aspiration occurring w/ trials during this eval. Pt does appear at min increased risk for aspiration overall secondary to his declined Cognitive status and overall medical status. Pt was fed trials of puree and thin liquids(pt assisted by holding cup/straw somewhat) exhibiting adequate oral phase bolus control and oral clearing b/t trials. No coughing or throat clearing noted during/post trials of liquids/purees; clear vocal quality assessed post trials. Pt did appear min impulsive during drinking which was monitored by SLP to smaller sips, slowly. O2 sats and RR/HR remained at his baseline w/ O2 sats 98-100% throughout trials. Recommend a dysphagia level 1 diet w/ thin liquids at this time; strict aspiration precautions and monitoring during all oral intake; recommend pills given in puree - crushed as able; full feeding assistance at all meals. ST services will f/u w/ toleration of diet and trials to upgrade diet as appropriate.  SLP Visit Diagnosis: Dysphagia, oropharyngeal phase (R13.12)    Aspiration Risk  Mild aspiration risk (d/t overall medical status)    Diet Recommendation  Dysphagia level 1 (puree) w/ Thin liquids; aspiration precautions; feeding assistance at all meals  Medication Administration: Crushed with puree    Other  Recommendations Recommended Consults:  (Dietician f/u) Oral Care Recommendations: Oral care BID;Staff/trained caregiver to provide oral care   Follow up Recommendations Skilled Nursing facility      Frequency and Duration min 3x week  2 weeks       Prognosis Prognosis for Safe Diet Advancement: Fair (-Good) Barriers to Reach Goals: Cognitive  deficits      Swallow Study   General Date of Onset: 06/21/17 HPI: Pt  is a 61 y.o. male with a known history of Schizophrenia, hypothyroidism, hypertension, BPH, chronic pain syndrome,  pressure sores, bedbound, obesity, COPD, CHF, rheumatoid arthritis, who presents to the hospital after he was found to be unresponsive in the facility where he resides. He was hypothermic, unresponsive, bradycardic, however, had a good blood pressure. He was given Narcan with no significant improvement of his heart rate for mental status. He was intubated in the emergency room.Marland Kitchen His chest x-ray revealed pneumonia. Labs showed terms of thrombocytopenia, leukopenia, metabolic alkalosis, mild hyperglycemia, but not lactic acidosis. MD suspect septic shock and Compensated hypercapnic respiratory failure; suspect obesity hypoventilation. Pt was orally intubated for 6 days and has tolerated extubation since; remained NPO. He has been using oral swabs w/ NSG for oral hygiene. Noted CXRs. Upon talking w/ pt, he often answered w/ "[name], yeh". He smiled frequently but did answer he liked "tomatoes" and "Newports" when asked what he he liked to eat/drink at home. Pt did not follow commands consistently and did not produce a volitional cough response when asked.  Type of Study: Bedside Swallow Evaluation Previous Swallow Assessment: none reported Diet Prior to this Study: NG Tube (since admission d/t oral intubation) Temperature Spikes Noted:  (low grade temps frequently; wbc 5.1) Respiratory Status: Nasal cannula (1 liter) History of Recent Intubation: Yes Length of Intubations (days): 6 days Date extubated: 06/26/17 Behavior/Cognition: Cooperative;Pleasant mood;Confused;Distractible;Requires cueing;Doesn't follow directions (awake; minimal verbal engagement/conversation) Oral Cavity Assessment: Within Functional Limits Oral Care Completed by SLP: Recent completion by staff Oral Cavity - Dentition: Missing dentition Vision: Functional for self-feeding Self-Feeding Abilities: Needs assist;Needs set up;Total assist (some contracture of hands(?)) Patient Positioning: Postural control adequate for testing (towel roll  behind pillows for support forward) Baseline Vocal Quality: Normal;Low vocal intensity Volitional Cough: Cognitively unable to elicit Volitional Swallow: Unable to elicit    Oral/Motor/Sensory Function Overall Oral Motor/Sensory Function: Within functional limits (in infividual movements and w/ bolus management)   Ice Chips Ice chips: Within functional limits Presentation: Spoon (fed; 5 trials)   Thin Liquid Thin Liquid: Within functional limits Presentation: Cup;Self Fed;Straw (assisted moderately; ~4-5 ozs total)    Nectar Thick Nectar Thick Liquid: Not tested   Honey Thick Honey Thick Liquid: Not tested   Puree Puree: Within functional limits Presentation: Spoon (fed; 2-3 ozs total)   Solid   GO   Solid: Not tested         Orinda Kenner, MS, CCC-SLP Watson,Katherine 06/28/2017,5:46 PM

## 2017-06-28 NOTE — Progress Notes (Signed)
[facility] NAME: Keeon Zurn    MR#:  [id number]  DATE OF BIRTH:  1956/08/30  SUBJECTIVE:  CHIEF COMPLAINT:   Chief Complaint  Patient presents with  . Code Sepsis   - Alert today, very slow to respond. -Breathing has improved and down to 1 L nasal cannula -Right upper extremity appears to be swollen -Still has increased oral secretions  REVIEW OF SYSTEMS:  Review of Systems  Constitutional: Positive for malaise/fatigue. Negative for chills and fever.  HENT: Negative for congestion, ear discharge, hearing loss and nosebleeds.   Respiratory: Positive for cough and shortness of breath. Negative for wheezing.   Cardiovascular: Positive for leg swelling. Negative for chest pain and palpitations.  Gastrointestinal: Negative for abdominal pain, constipation, diarrhea, nausea and vomiting.  Genitourinary: Negative for dysuria.  Neurological: Negative for dizziness, speech change, focal weakness, seizures and headaches.  Psychiatric/Behavioral: Negative for depression.    DRUG ALLERGIES:   Allergies  Allergen Reactions  . Methadone   . Penicillins     .Has patient had a PCN reaction causing immediate rash, facial/tongue/throat swelling, SOB or lightheadedness with hypotension: Unknown Has patient had a PCN reaction causing severe rash involving mucus membranes or skin necrosis: Unknown Has patient had a PCN reaction that required hospitalization: Unknown Has patient had a PCN reaction occurring within the last 10 years: Unknown If all of the above answers are "NO", then may proceed with Cephalosporin use.   Marland Kitchen Propoxyphene   . Sulfa Antibiotics     VITALS:  Blood pressure 122/80, pulse 94, temperature 100.2 F (37.9 C), resp. rate 17, height 6\' 1"  (1.854 m), weight (!) 142.4 kg (314 lb), SpO2 95 %.  PHYSICAL EXAMINATION:  Physical Exam  GENERAL:  61 y.o.-year-old Obese patient lying in the bed, Alert, extubated and not  in any acute distress  EYES: Pupils equal, round, reactive to light and accommodation. No scleral icterus. Erythematous conjunctiva. Extraocular muscles intact.  HEENT: Head atraumatic, normocephalic. Oropharynx and nasopharynx clear. Slight left-sided facial droop with increased pooling of saliva on the left side noted NECK:  Supple, no jugular venous distention. No thyroid enlargement, no tenderness.  LUNGS: Normal breath sounds bilaterally, no wheezing, rales or crepitation. No use of accessory muscles of respiration. Decreased bibasilar breath sounds with some rhonchi CARDIOVASCULAR: S1, S2 normal. No murmurs, rubs, or gallops.  ABDOMEN: Soft, obese, nontender, nondistended. Bowel sounds present. No organomegaly or mass.  EXTREMITIES: No cyanosis, or clubbing. 1+ pedal edema, chronic hyperpigmentation of both feet but dorsalis pedis palpable bilaterally, gangrene appearing -Right upper extremity is significantly swollen NEUROLOGIC: Alert today, able to follow some simple commands, left upper extremity has 4/5 strength, bilateral lower extremities with 2/5 strength and right upper extremity has 1/5 strength with swelling noted.. Sensation is intact. Gait not tested PSYCHIATRIC: The patient is alert and oriented to self , very slow to respond SKIN: No obvious rash, lesion, or ulcer.    LABORATORY PANEL:   CBC  Recent Labs Lab 06/28/17 0447  WBC 5.1  HGB 9.1*  HCT 27.2*  PLT 53*   ------------------------------------------------------------------------------------------------------------------  Chemistries   Recent Labs Lab 06/25/17 0135 06/26/17 0614  06/28/17 0447  NA 139 142  < > 138  K 4.8 3.9  < > 3.8  CL 102 102  < > 100*  CO2 33* 35*  < > 31  GLUCOSE 82 101*  < > 89  BUN 29* 27*  < > 5*  CREATININE 0.54* 0.49*  < > 0.48*  CALCIUM 8.1* 8.3*  < > 8.3*  MG 1.9 2.2  --   --   AST 17  --   --   --   ALT 17  --   --   --   ALKPHOS 59  --   --   --   BILITOT 0.4  --    --   --   < > = values in this interval not displayed. ------------------------------------------------------------------------------------------------------------------  Cardiac Enzymes  Recent Labs Lab 06/26/17 0614  TROPONINI 0.04*   ------------------------------------------------------------------------------------------------------------------  RADIOLOGY:  Dg Chest Port 1 View  Result Date: 06/27/2017 CLINICAL DATA:  Respiratory failure. EXAM: PORTABLE CHEST 1 VIEW COMPARISON:  06/26/2017. FINDINGS: Interim extubation removal of NG tube. Right IJ line stable position. Cardiomegaly with diffuse bilateral pulmonary infiltrates/ edema and bilateral pleural effusions. Slight worsening from prior exam. No pneumothorax. IMPRESSION: 1. Interim extubation and removal of NG tube. Right IJ line stable position. 2. Cardiomegaly with diffuse bilateral pulmonary infiltrates/edema and bilateral pleural effusions. Slight worsening from prior exam . Electronically Signed   By: Marcello Moores  Register   On: 06/27/2017 06:29    EKG:   Orders placed or performed during the hospital encounter of 06/21/17  . EKG 12-Lead  . EKG 12-Lead  . EKG 12-Lead  . EKG 12-Lead  . EKG 12-Lead  . EKG 12-Lead  . EKG 12-Lead  . EKG 12-Lead  . EKG 12-Lead  . EKG 12-Lead    ASSESSMENT AND PLAN:   61 year old obese male with past medical history significant for schizophrenia, seizure disorder, hypothyroidism, rheumatoid arthritis from a nursing home was brought in secondary to unresponsive state, hypothermia.  #1 sepsis-secondary to healthcare acquired pneumonia. -one set of blood cultures with coagulase-negative staphylococcus, likely contaminant. -Finished vancomycin and azithromycin And Tressie Ellis -currently off of pressors -Since blood pressure was low, his blood pressure medications including Norvasc, Coreg, clonidine, torsemide were held- might be restarted slowly as blood pressure is improving  #2 acute  respiratory failure-secondary to healthcare acquired pneumonia. Was ventilatory dependent. Extubated, and now only requiring 1-2 L oxygen. -Management per ICU team. -chest x-ray with left lower lobe infiltrate, follow-up chest x-ray with pulmonary edema. -Echocardiogram with diastolic dysfunction, LVH, EF 65% -IV with one dose of IV Lasix this morning. -Continue inhalers and finished antibiotics.  #3 acute thrombocytopenia-likely sepsis related and also medication induced. -.platelet count slowly improving and at 53 K -could also be medication induced- was on valproic acid, Prolixin-both are currently discontinued -Appreciate oncology consult. Monitor closely. No indication for transfusion at this time.  #4 acute encephalopathy-partially from sepsis and metabolic reasons. -Initial CT head was negative, now he is more alert but very slow to respond. Will repeat a CT head especially since is symmetric weakness noted on right upper extremity, could be from his swelling -Check ammonia level in a.m. -Also consider starting on his psychiatric medications  #5 schizophrenia-was on Prolixin & valproic acid, -Trazodone, Klonopin - currently are on hold. Will need to be slowly restarted back on his medications.  #6 right upper extremity swelling-check ultrasound to rule out DVT. Now that his platelet counts are at 53K, would recommend starting subcutaneous heparin as patient is immobile currently   Speech evaluation and physical therapy consults appreciated Will need rehabilitation   All the records are reviewed and case discussed with Care Management/Social Workerr. Management plans discussed with the patient, family and they are in agreement.  CODE STATUS: Full code  TOTAL TIME  TAKING CARE OF THIS PATIENT: 35 minutes.    Rickelle Sylvestre M.D on 06/28/2017 at 2:47 PM  Between 7am to 6pm - Pager - [phone]  After 6pm go to [email or web address] - password [email or web address] [facility]  Office  [phone]  CC: Primary care physician; Patient, No Pcp Per

## 2017-06-29 ENCOUNTER — Inpatient Hospital Stay: Payer: Medicaid Other

## 2017-06-29 DIAGNOSIS — R5381 Other malaise: Secondary | ICD-10-CM

## 2017-06-29 DIAGNOSIS — R509 Fever, unspecified: Secondary | ICD-10-CM

## 2017-06-29 LAB — URINALYSIS, COMPLETE (UACMP) WITH MICROSCOPIC
BACTERIA UA: NONE SEEN
Bilirubin Urine: NEGATIVE
Glucose, UA: NEGATIVE mg/dL
HGB URINE DIPSTICK: NEGATIVE
Ketones, ur: NEGATIVE mg/dL
Leukocytes, UA: NEGATIVE
NITRITE: NEGATIVE
PROTEIN: 30 mg/dL — AB
Specific Gravity, Urine: 1.016 (ref 1.005–1.030)
Squamous Epithelial / LPF: NONE SEEN
pH: 8 (ref 5.0–8.0)

## 2017-06-29 LAB — BASIC METABOLIC PANEL
ANION GAP: 6 (ref 5–15)
BUN: 8 mg/dL (ref 6–20)
CHLORIDE: 104 mmol/L (ref 101–111)
CO2: 32 mmol/L (ref 22–32)
Calcium: 8.4 mg/dL — ABNORMAL LOW (ref 8.9–10.3)
Creatinine, Ser: 0.53 mg/dL — ABNORMAL LOW (ref 0.61–1.24)
GFR calc Af Amer: >60 mL/min (ref >60)
GFR calc non Af Amer: >60 mL/min (ref >60)
GLUCOSE: 97 mg/dL (ref 65–99)
POTASSIUM: 3.7 mmol/L (ref 3.5–5.1)
Sodium: 142 mmol/L (ref 135–145)

## 2017-06-29 LAB — CBC
HCT: 28.5 % — ABNORMAL LOW (ref 40.0–52.0)
HEMOGLOBIN: 9.7 g/dL — AB (ref 13.0–18.0)
MCH: 30.1 pg (ref 26.0–34.0)
MCHC: 34.1 g/dL (ref 32.0–36.0)
MCV: 88.2 fL (ref 80.0–100.0)
Platelets: 101 10*3/uL — ABNORMAL LOW (ref 150–440)
RBC: 3.23 MIL/uL — ABNORMAL LOW (ref 4.40–5.90)
RDW: 16.8 % — ABNORMAL HIGH (ref 11.5–14.5)
WBC: 7.5 10*3/uL (ref 3.8–10.6)

## 2017-06-29 MED ORDER — ALBUTEROL SULFATE (2.5 MG/3ML) 0.083% IN NEBU: 2.5000 mg | INHALATION_SOLUTION | RESPIRATORY_TRACT | Status: DC | PRN

## 2017-06-29 MED ORDER — POTASSIUM CHLORIDE CRYS ER 20 MEQ PO TBCR
40.0000 meq | EXTENDED_RELEASE_TABLET | Freq: Once | ORAL | Status: AC
  Administered 2017-06-29: 40 meq via ORAL
  Filled 2017-06-29: qty 2

## 2017-06-29 MED ORDER — ACETAMINOPHEN 650 MG RE SUPP: 650.0000 mg | Freq: Four times a day (QID) | RECTAL | Status: DC | PRN

## 2017-06-29 MED ORDER — ADULT MULTIVITAMIN W/MINERALS CH
1.0000 | ORAL_TABLET | Freq: Every day | ORAL | Status: DC
  Administered 2017-06-29 – 2017-07-02 (×4): 1 via ORAL
  Filled 2017-06-29 (×4): qty 1

## 2017-06-29 MED ORDER — PREMIER PROTEIN SHAKE
11.0000 [oz_av] | Freq: Two times a day (BID) | ORAL | Status: DC
  Administered 2017-06-30 – 2017-07-02 (×5): 11 [oz_av] via ORAL

## 2017-06-29 MED ORDER — ACETAMINOPHEN 325 MG PO TABS: 650.0000 mg | ORAL_TABLET | Freq: Four times a day (QID) | ORAL | Status: DC | PRN

## 2017-06-29 MED ORDER — FUROSEMIDE 10 MG/ML IJ SOLN
40.0000 mg | Freq: Once | INTRAMUSCULAR | Status: AC
  Administered 2017-06-29: 40 mg via INTRAVENOUS
  Filled 2017-06-29: qty 4

## 2017-06-29 NOTE — Progress Notes (Signed)
Pt arrived on a bariatric bed to room 156 from ICU. Sacral foam applied, skin assessment completed, tele applied. Pt alert to self, bed in lowest position, bed alarm on and call bell in reach.

## 2017-06-29 NOTE — Progress Notes (Signed)
Per Belenda Cruise (SLP), pt should only be given thin liquids when alternating with food.

## 2017-06-29 NOTE — Progress Notes (Signed)
PULMONARY / CRITICAL CARE MEDICINE   Name: Jerry [name] MRN: [id number] DOB: [birth year]    ADMISSION DATE:  06/21/2017   PT PROFILE: 61 y.o. M with multiple medical problems including schizophrenia, hypothyroidism, rheumatoid arthritis, seizure disorder. He is a nursing home resident. He was found by staff unresponsive, bradycardic, hypothermic. Brought to the [facility] ED where he was intubated for decreased level of consciousness. Treated for rated cardia and hypothermia in the ED. Initial CXR reveals extensive left-sided infiltrate.   MAJOR EVENTS/TEST RESULTS: 07/26 admission as above 07/26 CT head: Negative 07/26 Hematology consultation for thrombocytopenia 07/30 very brief episode of PEA arrest 07/31 passed SBT, extubated 07/31 echocardiogram: LVEF 65-70%. Moderate LVH. LA mildly to moderately dilated. 08/01 cognition much improved. Tolerating extubation. Off vasopressors 08/02 cognition and orientation waxing and waning. Rattling cough. No respiratory distress. In SDU due to high risk of deterioration 08/02 RUE [place]: no DVT 08/03 transfer to [place]. Goals of care conversation initiated with patient's niece [facility] POA). No change in code status  INDWELLING DEVICES:: ETT 07/26 >> 07/31 R IJ CVL 07/27 >> 08/02 LUE PICC 08/02 >>   MICRO DATA: MRSA PCR 07/26 >> positive Resp 07/27 >> MSSA Blood 07/26 >> 1/2 coag negative staph Strep Ag 07/26 >> negative  PCT protocol 07/26: < 0.10 X 3  ANTIMICROBIALS: Vanc 07/26 >> 07/30 Azithro 07/26 >> 07/30 Ceftaz 07/26 >> 08/01   SUBJECTIVE:  RASS 0, + F/C more consistently. Denies pain. Denies shortness of breath.  VITAL SIGNS: BP 129/73   Pulse 91   Temp (!) 100.6 F (38.1 C)   Resp 17   Ht 6\' 1"  (1.854 m)   Wt (!) 314 lb (142.4 kg)   SpO2 96%   BMI 41.43 kg/m   HEMODYNAMICS:    VENTILATOR SETTINGS:    INTAKE / OUTPUT: I/O last 3 completed shifts: In: 2040 [P.O.:240; I.V.:1800] Out: 5245 [Urine:5245]  PHYSICAL  EXAMINATION: General: RASS 0, + F/C Neuro: EOMI, PERRLA, MAEs HEENT: NCAT, sclerae severely injected R>L Cardiovascular:  Regular, no murmurs Lungs: Few rhonchi, no wheezes Abdomen: Markedly obese, soft, not overtly tender, BS present Extremities: RA changes, no edema  LABS:  BMET  Recent Labs Lab 06/27/17 0137 06/28/17 0447 06/29/17 0407  NA 140 138 142  K 3.8 3.8 3.7  CL 102 100* 104  CO2 33* 31 32  BUN 10 5* 8  CREATININE 0.55* 0.48* 0.53*  GLUCOSE 85 89 97    Electrolytes  Recent Labs Lab 06/23/17 1700 06/24/17 0433 06/25/17 0135 06/26/17 0614 06/27/17 0137 06/28/17 0447 06/29/17 0407  CALCIUM  --  8.0* 8.1* 8.3* 8.3* 8.3* 8.4*  MG 2.2 2.3 1.9 2.2  --   --   --   PHOS 4.2  --  4.0 4.0  --   --   --     CBC  Recent Labs Lab 06/27/17 0137 06/28/17 0447 06/29/17 0407  WBC 5.5 5.1 7.5  HGB 9.2* 9.1* 9.7*  HCT 27.1* 27.2* 28.5*  PLT 30* 53* 101*    Coag's No results for input(s): APTT, INR in the last 168 hours.  Sepsis Markers  Recent Labs Lab 06/23/17 0113 06/23/17 0155  LATICACIDVEN  --  1.0  PROCALCITON <0.10  --     ABG  Recent Labs Lab 06/23/17 0110 06/25/17 0135  PHART 7.42 7.42  PCO2ART 51* 60*  PO2ART 68* 79*    Liver Enzymes  Recent Labs Lab 06/23/17 0113 06/25/17 0135  AST 21 17  ALT 25 17  ALKPHOS 66 59  BILITOT 0.5 0.4  ALBUMIN 2.1* 2.1*    Cardiac Enzymes  Recent Labs Lab 06/25/17 0135 06/25/17 0736 06/26/17 0614  TROPONINI <0.03 <0.03 0.04*    Glucose  Recent Labs Lab 06/25/17 1628 06/25/17 2005 06/26/17 0022 06/26/17 0408 06/26/17 0722 06/26/17 1119  GLUCAP 76 74 94 89 91 92    CXR: NNF  ASSESSMENT / PLAN:  PULMONARY A: Acute (on chronic?) hypoxemic respiratory failure Suspected chronic 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 Extensive left lung infiltrate, treated as HCAP - resolving Ventilator dependence, resolved Difficult airway ED intubation note  Pulmonary edema,  improving P:   Transfer to MedSurg Continue supplemental oxygen to maintain SPO2 90-95% Continue NTS PRN Repeat furosemide 1 08/03   CARDIOVASCULAR A:  Sinus bradycardia, resolved Septic shock, resolved Brief PEA arrest 41/63 Diastolic heart failure Poor vascular access P:  Continue telemetry monitoring PICC ordered  RENAL A:   Hypernatremia, resolved Chronic compensatory metabolic alkalosis, mild P:   Monitor BMET intermittently Monitor I/Os Correct electrolytes as indicated   GASTROINTESTINAL A:   Dysphagia post extubation P:   SUP: N/I post extubation SLP evaluation 08/01 Dysphagia 1 diet initiated 08/02 Change IV meds to PO  HEMATOLOGIC A:   Leukopenia, resolved Thrombocytopenia, improving - likely due to valproic acid P:  DVT px: SCDs Monitor CBC intermittently Transfuse per usual guidelines Continue to hold valproic acid  INFECTIOUS A:   Severe sepsis, resolved Suspected HCAP, treated Low-grade fevers without obvious source of infection P:   Monitor temp, WBC count Micro and abx as above   ENDOCRINE A:   Hypothyroidism - TSH now normal Hypothermia, resolved P:   Continue levothyroxin  NEUROLOGIC A:   Acute encephalopathy, resolved Psychiatric history Acute on chronic deconditioning and debilitation P:   RASS goal: 0 Minimize all sedating meds Holding chronic psychiatric medications Continue Haloperidol as needed for severe agitation PT eval and management   FAMILY: Niece updated at bedside  After transfer, PCCM will sign off. Please call if we can be of further assistance.   Merton Border, MD PCCM service Mobile [phone] Pager [phone]  06/29/2017, 12:39 PM

## 2017-06-29 NOTE — Progress Notes (Signed)
Speech Language Pathology Treatment: Dysphagia  Patient Details Name: Jerry [name] MRN: [id number] DOB: 09-Feb-1956 Today's Date: 06/29/2017 Time: 5176-1607 SLP Time Calculation (min) (ACUTE ONLY): 30 min  Assessment / Plan / Recommendation Clinical Impression  Pt was seen today for toleration of po's in assessment to upgrade diet consistency to level 2; and secondary to report that pt seemed to be "holding his liquids" when drinking. NSG reported pt was "holding" his liquids during breakfast this morning. NSG stated when she alternated foods w/ liquids, pt seemed to swallow more promptly w/ the liquids.  Pt continues to appear to present w/ adequate pharyngeal phase swallow function w/ trials of thin liquids - no overt s/s of aspiration occurred w/ liquid trials during this tx session; clear vocal quality noted b/t trials and no decline in O2 sats occurred. Pt was fed trials of thin liquids via straw(pt assisted by holding cup/straw somewhat but was weak in coordination/strength w/ UEs). However, he exhibited intermittent oral phase swallowing deficits c/b increased oral phase to hold and swish the liquids. Pt was given verbal and visual cues to swallow the liquids but pt did not follow through w/ the instruction. Pt appeared to do less oral swishing of liquids and more timely swallowing of liquids when boluses were given immediately following a food bolus - alternating foods/liquids. Adequate bolus control noted overall. With trials of min increased texture, pt exhibited adequate bolus mastication, transfer, and swallowing/clearing.  Recommend upgrade to a dysphagia level 2 diet w/ some added condiments to moisten foods; thin liquids - monitoring w/ ALL oral intake and feeding assistance. Recommend general aspiration precautions and encouragement to sit more upright during eating/drinking w/ head supported forward; recommend pills given in puree - for easier swallowing. Recommend feeding support at all  meals. NSG updated.    HPI HPI: Pt  is a 61 y.o. male with a known history of Schizophrenia, hypothyroidism, hypertension, BPH, chronic pain syndrome, pressure sores, bedbound, obesity, COPD, CHF, rheumatoid arthritis, who presents to the hospital after he was found to be unresponsive in the facility where he resides. He was hypothermic, unresponsive, bradycardic, however, had a good blood pressure. He was given Narcan with no significant improvement of his heart rate for mental status. He was intubated in the emergency room.Marland Kitchen His chest x-ray revealed pneumonia. Labs showed terms of thrombocytopenia, leukopenia, metabolic alkalosis, mild hyperglycemia, but not lactic acidosis. MD suspect septic shock and Compensated hypercapnic respiratory failure; suspect obesity hypoventilation. Pt was orally intubated for 6 days and has tolerated extubation since; remained NPO. He has been using oral swabs w/ NSG for oral hygiene. Noted CXRs. Upon talking w/ pt, he often answered w/ "Jerry [name], yeh". He smiled frequently but did answer he liked "tomatoes" and "Newports" when asked what he he liked to eat/drink at home. Pt did not follow commands consistently and did not produce a volitional cough response when asked. Pt continues to present w/ declined Cognition and reduced awareness of environment overall. Required moderate+ cues to follow through w/ any task.       SLP Plan  Continue with current plan of care       Recommendations  Diet recommendations: Dysphagia 2 (fine chop);Thin liquid (w/ added purees) Liquids provided via: Cup;Straw Medication Administration: Crushed with puree Supervision: Staff to assist with self feeding;Full supervision/cueing for compensatory strategies Compensations: Minimize environmental distractions;Slow rate;Lingual sweep for clearance of pocketing;Small sips/bites;Multiple dry swallows after each bite/sip;Follow solids with liquid Postural Changes and/or Swallow Maneuvers: Seated  upright 90 degrees;Upright 30-60  min after meal                General recommendations:  (Dietician f/u) Oral Care Recommendations: Oral care BID;Staff/trained caregiver to provide oral care Follow up Recommendations: Skilled Nursing facility SLP Visit Diagnosis: Dysphagia, oropharyngeal phase (R13.12) Plan: Continue with current plan of care       [place], [place], CCC-SLP Jerry [name] 06/29/2017, 3:24 PM

## 2017-06-29 NOTE — Progress Notes (Addendum)
Nutrition Follow-up  DOCUMENTATION CODES:   Morbid obesity  INTERVENTION:  Provide Premier Protein po BID, each supplement provides 160 kcal and 30 grams of protein.  Provide daily multivitamin with minerals.  NUTRITION DIAGNOSIS:   Increased nutrient needs related to wound healing as evidenced by estimated needs.  New diagnosis.  Inadequate oral intake related to inability to eat as evidenced by NPO status.  Resolved.  GOAL:   Patient will meet greater than or equal to 90% of their needs  Progressing.  MONITOR:   PO intake, Supplement acceptance, Labs, Diet advancement, Weight trends, Skin, I & O's  REASON FOR ASSESSMENT:   Ventilator    ASSESSMENT:   61 yo male PMHHx Schizophrenia, HTN, BPH, Chronic Pain syndrome, bed bound, obesity, AKI, COPD, CHF was found unresponsive, bradycardic, hypothermic, intubated in ED with HCAP, severe sepsis  -Following SLP evaluation on 8/2 diet was advanced to dysphagia 1 with thin liquids. -Today advanced to dysphagia 2 with thin liquids following RD assessment.  Met with patient at bedside. He is alert and able to respond, though he responds slowly. Patient reports his appetite is good. He has been doing well with meals and endorses he is finishing them all. He reports he wants some "real food." Discussed that he is on the pureed diet as recommended by SLP. Patient denies any N/V or abdominal pain. Reports he is having bowel movements and good urine output. Patient is amenable to drinking Premier Protein to help meet his protein needs. Noted during assessment patient had a hard time with his oral secretions. Kept dripping out of left side of mouth requiring cleaning with a towel.  Meal Completion: 100% of dinner last night and breakfast this morning and 75% of lunch today In the past 24 hrs patient has had approximately 1982 kcal (79% minimum estimated kcal needs) and 81 grams of protein (59% minimum estimated protein  needs).  Medications reviewed and include: levothyroxine.  Labs reviewed: Creatinine 0.53.  I/O 0700 8/2 to 0700 8/3: 3.945 L UOP (1.2 ml/kg/hr - adequate)  Weight trend: 142.4 kg on 8/2 (+5 kg from weight on 7/27)  Discussed with RN.  Diet Order:  DIET DYS 2 Room service appropriate? Yes with Assist; Fluid consistency: Thin  Skin:  Wound (see comment) (Stg I to b/l glutes and sacrum, skin tear right neck)  Last BM:  06/28/2017  Height:   Ht Readings from Last 1 Encounters:  06/21/17 '6\' 1"'  (1.854 m)    Weight:   Wt Readings from Last 1 Encounters:  06/28/17 (!) 314 lb (142.4 kg)    Ideal Body Weight:  83.63 kg  BMI:  Body mass index is 41.43 kg/m.  Estimated Nutritional Needs:   Kcal:  2500-2800kcal/day   Protein:  137-165g/day   Fluid:  >2.5L/day   EDUCATION NEEDS:   No education needs identified at this time  Jerry Blade, MS, RD, LDN Pager: [phone] After Hours Pager: [phone]

## 2017-06-29 NOTE — Progress Notes (Signed)
[facility] [place] NAME: Easter Schinke    MR#:  [id number]  DATE OF BIRTH:  1956-01-20  SUBJECTIVE:  CHIEF COMPLAINT:   Chief Complaint  Patient presents with  . Code Sepsis   - Alert today, very slow to respond. -Breathing has improved and down to 1 L nasal cannula -Right upper extremity appears to be swollen -Still has increased oral secretions  REVIEW OF SYSTEMS:  Review of Systems  Constitutional: Positive for malaise/fatigue. Negative for chills and fever.  HENT: Negative for congestion, ear discharge, hearing loss and nosebleeds.   Respiratory: Positive for cough and shortness of breath. Negative for wheezing.   Cardiovascular: Positive for leg swelling. Negative for chest pain and palpitations.  Gastrointestinal: Negative for abdominal pain, constipation, diarrhea, nausea and vomiting.  Genitourinary: Negative for dysuria.  Neurological: Negative for dizziness, speech change, focal weakness, seizures and headaches.  Psychiatric/Behavioral: Negative for depression.    DRUG ALLERGIES:   Allergies  Allergen Reactions  . Methadone   . Penicillins     .Has patient had a PCN reaction causing immediate rash, facial/tongue/throat swelling, SOB or lightheadedness with hypotension: Unknown Has patient had a PCN reaction causing severe rash involving mucus membranes or skin necrosis: Unknown Has patient had a PCN reaction that required hospitalization: Unknown Has patient had a PCN reaction occurring within the last 10 years: Unknown If all of the above answers are "NO", then may proceed with Cephalosporin use.   Marland Kitchen Propoxyphene   . Sulfa Antibiotics     VITALS:  Blood pressure 129/73, pulse 91, temperature (!) 100.6 F (38.1 C), resp. rate 17, height 6\' 1"  (1.854 m), weight (!) 142.4 kg (314 lb), SpO2 96 %.  PHYSICAL EXAMINATION:  Physical Exam  GENERAL:  61 y.o.-year-old Obese patient lying in the bed, Alert, extubated and  not in any acute distress  EYES: Pupils equal, round, reactive to light and accommodation. No scleral icterus. Erythematous conjunctiva. Extraocular muscles intact.  HEENT: Head atraumatic, normocephalic. Oropharynx and nasopharynx clear. Slight left-sided facial droop with increased pooling of saliva on the left side noted NECK:  Supple, no jugular venous distention. No thyroid enlargement, no tenderness.  LUNGS: Normal breath sounds bilaterally, no wheezing, rales or crepitation. No use of accessory muscles of respiration. Decreased bibasilar breath sounds with some rhonchi CARDIOVASCULAR: S1, S2 normal. No murmurs, rubs, or gallops.  ABDOMEN: Soft, obese, nontender, nondistended. Bowel sounds present. No organomegaly or mass.  EXTREMITIES: No cyanosis, or clubbing. 1+ pedal edema, chronic hyperpigmentation of both feet but dorsalis pedis palpable bilaterally, gangrene appearing -Right upper extremity is significantly swollen NEUROLOGIC: Alert today, able to follow some simple commands, left upper extremity has 4/5 strength, bilateral lower extremities with 2/5 strength and right upper extremity has 1/5 strength with swelling noted.. Sensation is intact. Gait not tested PSYCHIATRIC: The patient is alert and oriented to self , very slow to respond SKIN: No obvious rash, lesion, or ulcer.    LABORATORY PANEL:   CBC  Recent Labs Lab 06/29/17 0407  WBC 7.5  HGB 9.7*  HCT 28.5*  PLT 101*   ------------------------------------------------------------------------------------------------------------------  Chemistries   Recent Labs Lab 06/25/17 0135 06/26/17 0614  06/29/17 0407  NA 139 142  < > 142  K 4.8 3.9  < > 3.7  CL 102 102  < > 104  CO2 33* 35*  < > 32  GLUCOSE 82 101*  < > 97  BUN 29* 27*  < > 8  CREATININE 0.54* 0.49*  < > 0.53*  CALCIUM 8.1* 8.3*  < > 8.4*  MG 1.9 2.2  --   --   AST 17  --   --   --   ALT 17  --   --   --   ALKPHOS 59  --   --   --   BILITOT 0.4  --    --   --   < > = values in this interval not displayed. ------------------------------------------------------------------------------------------------------------------  Cardiac Enzymes  Recent Labs Lab 06/26/17 0614  TROPONINI 0.04*   ------------------------------------------------------------------------------------------------------------------  RADIOLOGY:  Ct Head Wo Contrast  Result Date: 06/28/2017 CLINICAL DATA:  61 year old male with schizophrenia, seizure disorder and altered mental status, possible sepsis EXAM: CT HEAD WITHOUT CONTRAST TECHNIQUE: Contiguous axial images were obtained from the base of the skull through the vertex without intravenous contrast. COMPARISON:  Prior head CT 06/21/2017 FINDINGS: Brain: No evidence of acute infarction, hemorrhage, hydrocephalus, extra-axial collection or mass lesion/mass effect. Stable mild cerebral cortical volume loss. Vascular: No hyperdense vessel or unexpected calcification. Skull: Normal. Negative for fracture or focal lesion. Sinuses/Orbits: No acute finding. Other: None. IMPRESSION: 1. No acute intracranial abnormality. 2. Stable mild cerebral cortical volume loss. Electronically Signed   By: Jacqulynn Cadet M.D.   On: 06/28/2017 16:33   US Venous Img Upper Uni Right  Result Date: 06/28/2017 CLINICAL DATA:  61 year old male with right upper extremity swelling EXAM: RIGHT UPPER EXTREMITY VENOUS DOPPLER ULTRASOUND TECHNIQUE: Gray-scale sonography with graded compression, as well as color Doppler and duplex ultrasound were performed to evaluate the upper extremity deep venous system from the level of the subclavian vein and including the jugular, axillary, basilic, radial, ulnar and upper cephalic vein. Spectral Doppler was utilized to evaluate flow at rest and with distal augmentation maneuvers. COMPARISON:  None. FINDINGS: Contralateral Subclavian Vein: Respiratory phasicity is normal and symmetric with the symptomatic side. No  evidence of thrombus. Normal compressibility. Internal Jugular Vein: No evidence of thrombus. Normal compressibility, respiratory phasicity and response to augmentation. Subclavian Vein: No evidence of thrombus. Normal compressibility, respiratory phasicity and response to augmentation. Axillary Vein: No evidence of thrombus. Normal compressibility, respiratory phasicity and response to augmentation. Cephalic Vein: No evidence of thrombus. Normal compressibility, respiratory phasicity and response to augmentation. Basilic Vein: No evidence of thrombus. Normal compressibility, respiratory phasicity and response to augmentation. Brachial Veins: No evidence of thrombus. Normal compressibility, respiratory phasicity and response to augmentation. Radial Veins: No evidence of thrombus. Normal compressibility, respiratory phasicity and response to augmentation. Ulnar Veins: No evidence of thrombus. Normal compressibility, respiratory phasicity and response to augmentation. Venous Reflux:  None visualized. Other Findings:  None visualized. IMPRESSION: No evidence of DVT within the right upper extremity. Electronically Signed   By: Jacqulynn Cadet M.D.   On: 06/28/2017 18:54   Dg Chest Port 1 View  Result Date: 06/29/2017 CLINICAL DATA:  Initial evaluation for respiratory failure. EXAM: PORTABLE CHEST 1 VIEW COMPARISON:  Prior radiograph from 06/27/2017. FINDINGS: There has been interval removal of a right IJ approach centra venous catheter with placement of a left PICC catheter, tip overlying the cavoatrial junction. Stable cardiomegaly.  Mediastinal silhouette within normal limits. Lungs hypoinflated. Diffuse vascular congestion with interstitial prominence, most consistent with pulmonary edema, slightly improved from previous. Superimposed bibasilar opacities, left greater than right, which may reflect atelectasis or infiltrates. Probable small bilateral pleural effusions. No pneumothorax. Osseous structures  unchanged. IMPRESSION: 1. Interval removal of right IJ approach central venous catheter with placement of  left-sided PICC catheter, tip overlying the cavoatrial junction. 2. Stable cardiomegaly with moderate diffuse pulmonary edema, improved relative to 06/27/2017. Probable small bilateral pleural effusions. 3. Shallow lung inflation with superimposed left greater than right bibasilar opacities, which may reflect atelectasis or infiltrates. These are also improved from previous. Electronically Signed   By: Jeannine Boga M.D.   On: 06/29/2017 04:45    EKG:   Orders placed or performed during the hospital encounter of 06/21/17  . EKG 12-Lead  . EKG 12-Lead  . EKG 12-Lead  . EKG 12-Lead  . EKG 12-Lead  . EKG 12-Lead  . EKG 12-Lead  . EKG 12-Lead  . EKG 12-Lead  . EKG 12-Lead    ASSESSMENT AND PLAN:   61 year old obese male with past medical history significant for schizophrenia, seizure disorder, hypothyroidism, rheumatoid arthritis from a nursing home was brought in secondary to unresponsive state, hypothermia.  #1 sepsis-secondary to healthcare acquired pneumonia. -one set of blood cultures with coagulase-negative staphylococcus, likely contaminant. -Finished vancomycin and azithromycin And Tressie Ellis -currently off of pressors -Since blood pressure was low, his blood pressure medications including Norvasc, Coreg, clonidine, torsemide were held- might be restarted slowly as blood pressure is improving -However had low-grade fevers again this morning. Check urine analysis  #2 acute respiratory failure-secondary to healthcare acquired pneumonia. Was ventilatory dependent. Extubated, and now only requiring 1-2 L oxygen. -chest x-ray with left lower lobe infiltrate, follow-up chest x-ray with pulmonary edema. -Echocardiogram with diastolic dysfunction, LVH, EF 65% -IV with one dose of IV Lasix prn. -Continue inhalers and finished antibiotics.  #3 acute thrombocytopenia-likely sepsis  related and also medication induced. -.platelet count slowly improving and >100 K now -could also be medication induced- was on valproic acid, Prolixin-both are currently discontinued -Appreciate oncology consult. Monitor closely. No indication for transfusion at this time.  #4 acute encephalopathy-partially from sepsis and metabolic reasons. -Initial CT head was negative, now he is more alert - monitor for now -Also consider starting on his psychiatric medications  #5 schizophrenia-was on Prolixin & valproic acid, -Trazodone, Klonopin - currently are on hold. Will need to be slowly restarted back on his medications.  #6 right upper extremity swelling- negative ultrasound for DVT. -  Now that his platelet counts are at >100K, would recommend starting subcutaneous heparin as patient is immobile currently   Speech evaluation and physical therapy consults appreciated Will need rehabilitation  He will likely be transferred out to floor today   All the records are reviewed and case discussed with Care Management/Social Workerr. Management plans discussed with the patient, family and they are in agreement.  CODE STATUS: Full code  TOTAL TIME TAKING CARE OF THIS PATIENT: 36 minutes.    Arsema Tusing M.D on 06/29/2017 at 1:42 PM  Between 7am to 6pm - Pager - [phone]  After 6pm go to [email or web address] - password [email or web address] [facility]  Office  [phone]  CC: Primary care physician; Patient, No Pcp Per

## 2017-06-30 LAB — COMPREHENSIVE METABOLIC PANEL
ALBUMIN: 2.5 g/dL — AB (ref 3.5–5.0)
ALT: 34 U/L (ref 17–63)
ANION GAP: 7 (ref 5–15)
AST: 29 U/L (ref 15–41)
Alkaline Phosphatase: 70 U/L (ref 38–126)
BILIRUBIN TOTAL: 0.7 mg/dL (ref 0.3–1.2)
BUN: 8 mg/dL (ref 6–20)
CHLORIDE: 106 mmol/L (ref 101–111)
CO2: 27 mmol/L (ref 22–32)
Calcium: 8.2 mg/dL — ABNORMAL LOW (ref 8.9–10.3)
Creatinine, Ser: 0.54 mg/dL — ABNORMAL LOW (ref 0.61–1.24)
GFR calc Af Amer: >60 mL/min (ref >60)
Glucose, Bld: 79 mg/dL (ref 65–99)
POTASSIUM: 3.4 mmol/L — AB (ref 3.5–5.1)
Sodium: 140 mmol/L (ref 135–145)
TOTAL PROTEIN: 6.3 g/dL — AB (ref 6.5–8.1)

## 2017-06-30 LAB — CBC
HEMATOCRIT: 27 % — AB (ref 40.0–52.0)
HEMOGLOBIN: 9.2 g/dL — AB (ref 13.0–18.0)
MCH: 29.7 pg (ref 26.0–34.0)
MCHC: 33.9 g/dL (ref 32.0–36.0)
MCV: 87.7 fL (ref 80.0–100.0)
Platelets: 142 10*3/uL — ABNORMAL LOW (ref 150–440)
RBC: 3.08 MIL/uL — ABNORMAL LOW (ref 4.40–5.90)
RDW: 16.7 % — ABNORMAL HIGH (ref 11.5–14.5)
WBC: 6.3 10*3/uL (ref 3.8–10.6)

## 2017-06-30 MED ORDER — CLONAZEPAM 0.5 MG PO TABS
1.0000 mg | ORAL_TABLET | Freq: Every day | ORAL | Status: DC
  Administered 2017-06-30 – 2017-07-01 (×2): 1 mg via ORAL
  Filled 2017-06-30 (×2): qty 2

## 2017-06-30 MED ORDER — DIVALPROEX SODIUM 125 MG PO CSDR
750.0000 mg | DELAYED_RELEASE_CAPSULE | Freq: Three times a day (TID) | ORAL | Status: DC
  Administered 2017-06-30 – 2017-07-02 (×7): 750 mg via ORAL
  Filled 2017-06-30 (×9): qty 6

## 2017-06-30 MED ORDER — DILTIAZEM HCL 25 MG/5ML IV SOLN
20.0000 mg | Freq: Once | INTRAVENOUS | Status: AC
  Administered 2017-06-30: 20 mg via INTRAVENOUS
  Filled 2017-06-30: qty 5

## 2017-06-30 MED ORDER — BENZTROPINE MESYLATE 0.5 MG PO TABS
0.5000 mg | ORAL_TABLET | Freq: Two times a day (BID) | ORAL | Status: DC
  Administered 2017-06-30 – 2017-07-02 (×5): 0.5 mg via ORAL
  Filled 2017-06-30 (×6): qty 1

## 2017-06-30 MED ORDER — LEVOTHYROXINE SODIUM 50 MCG PO TABS: 150.0000 ug | ORAL_TABLET | Freq: Every day | ORAL | Status: DC

## 2017-06-30 MED ORDER — TAMSULOSIN HCL 0.4 MG PO CAPS
0.4000 mg | ORAL_CAPSULE | Freq: Every day | ORAL | Status: DC
  Administered 2017-06-30 – 2017-07-02 (×3): 0.4 mg via ORAL
  Filled 2017-06-30 (×3): qty 1

## 2017-06-30 MED ORDER — HYDROXYCHLOROQUINE SULFATE 200 MG PO TABS
200.0000 mg | ORAL_TABLET | Freq: Two times a day (BID) | ORAL | Status: DC
  Administered 2017-06-30 – 2017-07-02 (×5): 200 mg via ORAL
  Filled 2017-06-30 (×5): qty 1

## 2017-06-30 MED ORDER — METOPROLOL TARTRATE 5 MG/5ML IV SOLN
INTRAVENOUS | Status: AC
  Filled 2017-06-30: qty 5

## 2017-06-30 MED ORDER — METOPROLOL TARTRATE 5 MG/5ML IV SOLN
5.0000 mg | Freq: Once | INTRAVENOUS | Status: AC
  Administered 2017-06-30: 5 mg via INTRAVENOUS

## 2017-06-30 MED ORDER — CARVEDILOL 25 MG PO TABS
25.0000 mg | ORAL_TABLET | Freq: Two times a day (BID) | ORAL | Status: DC
  Administered 2017-06-30 – 2017-07-02 (×5): 25 mg via ORAL
  Filled 2017-06-30 (×6): qty 1

## 2017-06-30 NOTE — Progress Notes (Signed)
[place] [facility] [place] NAME: Jerry [name]    MR#:  [id number]  DATE OF BIRTH:  01-May-1956  SUBJECTIVE: HR  this morning 150,. improved with IV 20 mg Cardizem, heart rate did decrease to 80. His EKG showed atrial fibrillation. Patient has a lot of secretions but denies any chest pain or shortness of breath. He is alert and awake but slow to respond due to his baseline dementia.   CHIEF COMPLAINT:   Chief Complaint  Patient presents with  . Code Sepsis   - Alert today, very slow to respond.  -Right upper extremity appears to be swollen   REVIEW OF SYSTEMS:  Review of Systems  Constitutional: Positive for malaise/fatigue. Negative for chills and fever.  HENT: Negative for congestion, ear discharge, hearing loss and nosebleeds.   Respiratory: Positive for cough and shortness of breath. Negative for wheezing.   Cardiovascular: Positive for leg swelling. Negative for chest pain and palpitations.  Gastrointestinal: Negative for abdominal pain, constipation, diarrhea, nausea and vomiting.  Genitourinary: Negative for dysuria.  Neurological: Negative for dizziness, speech change, focal weakness, seizures and headaches.  Psychiatric/Behavioral: Negative for depression.    DRUG ALLERGIES:   Allergies  Allergen Reactions  . Methadone   . Penicillins     .Has patient had a PCN reaction causing immediate rash, facial/tongue/throat swelling, SOB or lightheadedness with hypotension: Unknown Has patient had a PCN reaction causing severe rash involving mucus membranes or skin necrosis: Unknown Has patient had a PCN reaction that required hospitalization: Unknown Has patient had a PCN reaction occurring within the last 10 years: Unknown If all of the above answers are "NO", then may proceed with Cephalosporin use.   Marland Kitchen Propoxyphene   . Sulfa Antibiotics     VITALS:  Blood pressure 121/67, pulse 96, temperature 99.1 F (37.3 C), temperature source  Oral, resp. rate 18, height 6\' 1"  (1.854 m), weight (!) 142.4 kg (314 lb), SpO2 95 %.  PHYSICAL EXAMINATION:  Physical Exam  GENERAL:  61 y.o.-year-old Obese patient lying in the bed, Alert, extubated and not in any acute distress  EYES: Pupils equal, round, reactive to light and accommodation. No scleral icterus. Erythematous conjunctiva. Extraocular muscles intact.  HEENT: Head atraumatic, normocephalic. Oropharynx and nasopharynx clear. Slight left-sided facial droop with increased pooling of saliva on the left side noted NECK:  Supple, no jugular venous distention. No thyroid enlargement, no tenderness.  LUNGS: Normal breath sounds bilaterally, no wheezing, rales or crepitation. No use of accessory muscles of respiration. Decreased bibasilar breath sounds with some rhonchi CARDIOVASCULAR: S1, S2 normal. No murmurs, rubs, or gallops.  ABDOMEN: Soft, obese, nontender, nondistended. Bowel sounds present. No organomegaly or mass.  EXTREMITIES: No cyanosis, or clubbing. 1+ pedal edema, chronic hyperpigmentation of both feet but dorsalis pedis palpable bilaterally, gangrene appearing -Right upper extremity is significantly swollen NEUROLOGIC: Alert , able to follow some simple commands, left upper extremity has 4/5 strength, bilateral lower extremities with 2/5 strength and right upper extremity has 1/5 strength with swelling noted.. Sensation is intact. Gait not tested PSYCHIATRIC: The patient is alert and oriented to self , very slow to respond SKIN: No obvious rash, lesion, or ulcer.    LABORATORY PANEL:   CBC  Recent Labs Lab 06/30/17 0456  WBC 6.3  HGB 9.2*  HCT 27.0*  PLT 142*   ------------------------------------------------------------------------------------------------------------------  Chemistries   Recent Labs Lab 06/26/17 0614  06/30/17 0456  NA 142  < > 140  K  3.9  < > 3.4*  CL 102  < > 106  CO2 35*  < > 27  GLUCOSE 101*  < > 79  BUN 27*  < > 8  CREATININE  0.49*  < > 0.54*  CALCIUM 8.3*  < > 8.2*  MG 2.2  --   --   AST  --   --  29  ALT  --   --  34  ALKPHOS  --   --  70  BILITOT  --   --  0.7  < > = values in this interval not displayed. ------------------------------------------------------------------------------------------------------------------  Cardiac Enzymes  Recent Labs Lab 06/26/17 0614  TROPONINI 0.04*   ------------------------------------------------------------------------------------------------------------------  RADIOLOGY:  Ct Head Wo Contrast  Result Date: 06/28/2017 CLINICAL DATA:  61 year old male with schizophrenia, seizure disorder and altered mental status, possible sepsis EXAM: CT HEAD WITHOUT CONTRAST TECHNIQUE: Contiguous axial images were obtained from the base of the skull through the vertex without intravenous contrast. COMPARISON:  Prior head CT 06/21/2017 FINDINGS: Brain: No evidence of acute infarction, hemorrhage, hydrocephalus, extra-axial collection or mass lesion/mass effect. Stable mild cerebral cortical volume loss. Vascular: No hyperdense vessel or unexpected calcification. Skull: Normal. Negative for fracture or focal lesion. Sinuses/Orbits: No acute finding. Other: None. IMPRESSION: 1. No acute intracranial abnormality. 2. Stable mild cerebral cortical volume loss. Electronically Signed   By: Jacqulynn Cadet M.D.   On: 06/28/2017 16:33   US Venous Img Upper Uni Right  Result Date: 06/28/2017 CLINICAL DATA:  61 year old male with right upper extremity swelling EXAM: RIGHT UPPER EXTREMITY VENOUS DOPPLER ULTRASOUND TECHNIQUE: Gray-scale sonography with graded compression, as well as color Doppler and duplex ultrasound were performed to evaluate the upper extremity deep venous system from the level of the subclavian vein and including the jugular, axillary, basilic, radial, ulnar and upper cephalic vein. Spectral Doppler was utilized to evaluate flow at rest and with distal augmentation maneuvers.  COMPARISON:  None. FINDINGS: Contralateral Subclavian Vein: Respiratory phasicity is normal and symmetric with the symptomatic side. No evidence of thrombus. Normal compressibility. Internal Jugular Vein: No evidence of thrombus. Normal compressibility, respiratory phasicity and response to augmentation. Subclavian Vein: No evidence of thrombus. Normal compressibility, respiratory phasicity and response to augmentation. Axillary Vein: No evidence of thrombus. Normal compressibility, respiratory phasicity and response to augmentation. Cephalic Vein: No evidence of thrombus. Normal compressibility, respiratory phasicity and response to augmentation. Basilic Vein: No evidence of thrombus. Normal compressibility, respiratory phasicity and response to augmentation. Brachial Veins: No evidence of thrombus. Normal compressibility, respiratory phasicity and response to augmentation. Radial Veins: No evidence of thrombus. Normal compressibility, respiratory phasicity and response to augmentation. Ulnar Veins: No evidence of thrombus. Normal compressibility, respiratory phasicity and response to augmentation. Venous Reflux:  None visualized. Other Findings:  None visualized. IMPRESSION: No evidence of DVT within the right upper extremity. Electronically Signed   By: Jacqulynn Cadet M.D.   On: 06/28/2017 18:54   Dg Chest Port 1 View  Result Date: 06/29/2017 CLINICAL DATA:  Initial evaluation for respiratory failure. EXAM: PORTABLE CHEST 1 VIEW COMPARISON:  Prior radiograph from 06/27/2017. FINDINGS: There has been interval removal of a right IJ approach centra venous catheter with placement of a left PICC catheter, tip overlying the cavoatrial junction. Stable cardiomegaly.  Mediastinal silhouette within normal limits. Lungs hypoinflated. Diffuse vascular congestion with interstitial prominence, most consistent with pulmonary edema, slightly improved from previous. Superimposed bibasilar opacities, left greater than  right, which may reflect atelectasis or infiltrates. Probable small bilateral pleural effusions.  No pneumothorax. Osseous structures unchanged. IMPRESSION: 1. Interval removal of right IJ approach central venous catheter with placement of left-sided PICC catheter, tip overlying the cavoatrial junction. 2. Stable cardiomegaly with moderate diffuse pulmonary edema, improved relative to 06/27/2017. Probable small bilateral pleural effusions. 3. Shallow lung inflation with superimposed left greater than right bibasilar opacities, which may reflect atelectasis or infiltrates. These are also improved from previous. Electronically Signed   By: Jeannine Boga M.D.   On: 06/29/2017 04:45    EKG:   Orders placed or performed during the hospital encounter of 06/21/17  . EKG 12-Lead  . EKG 12-Lead  . EKG 12-Lead  . EKG 12-Lead  . EKG 12-Lead  . EKG 12-Lead  . EKG 12-Lead  . EKG 12-Lead  . EKG 12-Lead  . EKG 12-Lead  . EKG 12-Lead  . EKG 12-Lead  . EKG 12-Lead  . EKG 12-Lead  . EKG 12-Lead  . EKG 12-Lead  . EKG 12-Lead  . EKG 12-Lead    ASSESSMENT AND PLAN:   61 year old obese male with past medical history significant for schizophrenia, seizure disorder, hypothyroidism, rheumatoid arthritis from a nursing home was brought in secondary to unresponsive state, hypothermia.  #1 .sepsis-secondary to healthcare acquired pneumonia. -one set of blood cultures with coagulase-negative staphylococcus, likely contaminant. -Finished vancomycin and azithromycin And Fortaz  -Since blood pressure was low, his blood pressure medications including Norvasc, Coreg, clonidine, torsemide were held-till now .but restarted Coreg today. Will resume other medicines slowly.  As  BP permits    - #2 acute respiratory failure-secondary to healthcare acquired pneumonia. Was ventilatory dependent. Extubated, now on room air saturations 95%.  -chest x-ray with left lower lobe infiltrate, follow-up chest x-ray with  pulmonary edema.  -Echocardiogram with diastolic dysfunction, LVH, EF 65% -IV with one dose of IV Lasix prn. -Continue inhalers and finished antibiotics.  #3 acute thrombocytopenia-likely sepsis related and also medication induced. -.platelet count slowly improving and >140 K now -could also be medication induced- was on valproic acid, Prolixin-both are currently discontinued  -Appreciate oncology consult. Monitor closely. No indication for transfusion at this time.  #4 acute encephalopathy-partially from sepsis and metabolic reasons.  -Initial CT head was negative, now he is more alert - monitor for now -He started his psych medicines including Klonopin, benzo atropine, valproic acid. Restart Prolixin slowly.  #5 schizophrenia-was on Prolixin & valproic acid, -Trazodone, Klonopin - currently are on hold. Will need to be slowly restarted back on his medications.  #6 right upper extremity swelling- negative ultrasound for DVT. -  Speech evaluation and physical therapy consults appreciated Will need rehabilitation  He will likely be transferred out to floor today   All the records are reviewed and case discussed with Care Management/Social Workerr. Management plans discussed with the patient, family and they are in agreement.  CODE STATUS: Full code  TOTAL TIME TAKING CARE OF THIS PATIENT: 36 minutes.    Epifanio Lesches M.D on 06/30/2017 at 10:51 AM  Between 7am to 6pm - Pager - [phone]  After 6pm go to [email or web address] - password [email or web address] [facility]  Office  [phone]  CC: Primary care physician; Patient, No Pcp Per

## 2017-06-30 NOTE — Progress Notes (Signed)
Patient in bariatric bed, yelling out for someone to call the police because someone is taking something. No one is in the room. Patient redirected and reoriented.

## 2017-06-30 NOTE — Progress Notes (Signed)
Central telemetry reported pt converted to A-Fib. MD paged.

## 2017-06-30 NOTE — Progress Notes (Signed)
Suctioned patient, copious amount of secretions in mouth. Tolerated well.

## 2017-06-30 NOTE — Progress Notes (Signed)
Dr Bud Face ordered metoprolol 5 mgm IV once for A-fib.

## 2017-06-30 NOTE — Progress Notes (Signed)
Pt HR 140-150's. MD Vianne Bulls paged and notified. Orders received for IV Cardizem 20 mg and EKG. Order placed.

## 2017-06-30 NOTE — Clinical Social Work Note (Addendum)
Clinical Social Work Assessment  Patient Details  Name: Jerry [name] MRN: [id number] Date of Birth: Sep 06, 1956  Date of referral:  06/30/17               Reason for consult:  Facility Placement                Permission sought to share information with:  Facility Art therapist granted to share information::  Yes, Verbal Permission Granted  Name::        Agency::     Relationship::     Contact Information:     Housing/Transportation Living arrangements for the past 2 months:  [place] of Information:  Facility, Medical Team, Case Manager Patient Interpreter Needed:  None Criminal Activity/Legal Involvement Pertinent to Current Situation/Hospitalization:  No - Comment as needed Significant Relationships:  None Lives with:  Facility Resident Do you feel safe going back to the place where you live?  Yes Need for family participation in patient care:  No (Coment)  Care giving concerns:  Patient admitted from SNF LTC   Social Worker assessment / plan:  CSW contacted the patient's HCPOA [name]. The POA reported that the family is considering changing to a different SNF. The CSW advised the POA of the patient's new room number now that he has come off of ICU, and the CSW provided contact information for the the CSW for the patient's unit. The CSW contacted the admissions coordinator from [facility] who reported that the patient can return when stable. The patient has active hallucinations at this time and is not appropriate to participate in an assessment. The CSW has advised the patient's RN that he may benefit from a psych consult as he believes that he sees a person in his room stealing his possessions.   CSW will continue to follow for discharge facilitation.  Employment status:  Retired Forensic scientist:  Medicaid [place] PT Recommendations:  Not assessed at this time Information / Referral to community resources:     Patient/Family's  Response to care:  Patient's family not available. The patient is not appropriate for assessment participation due to active hallucinations.  Patient/Family's Understanding of and Emotional Response to Diagnosis, Current Treatment, and Prognosis: The patient's family is not available, and the patient is not able to participate in his care currently.  Emotional Assessment Appearance:  Appears stated age Attitude/Demeanor/Rapport:  Irrational Affect (typically observed):  Afraid/Fearful, Agitated Orientation:  Oriented to Self Alcohol / Substance use:  Never Used Psych involvement (Current and /or in the community):  Yes (Comment) (Patient has schizophrenia. CSW has referred for psych consult)  Discharge Needs  Concerns to be addressed:  Care Coordination, Discharge Planning Concerns Readmission within the last 30 days:  No Current discharge risk:  None Barriers to Discharge:  Continued Medical Work up   [employer], LCSW 06/30/2017, 5:08 PM

## 2017-06-30 NOTE — Progress Notes (Signed)
pts HR elevated to 140's/150's. Afib/flutter. Pt at rest. md notified. Orders recd. Will cont to monitor HR.

## 2017-06-30 NOTE — Progress Notes (Signed)
Metoprolol 5 mg IV was administered. Pt's rate is 118-140.

## 2017-07-01 LAB — TSH: TSH: 3.672 u[IU]/mL (ref 0.350–4.500)

## 2017-07-01 LAB — MAGNESIUM: Magnesium: 2 mg/dL (ref 1.7–2.4)

## 2017-07-01 NOTE — Progress Notes (Signed)
Pt disoriented and talking to self, noted to have tremors on the upper extremities. Heart rate per monitor on 180's, sustained for less than 30 seconds, PR came right back on the 70's. Vital signs taken per dinamap and manually (see flowsheet), Dr Ether Griffins paged and made aware. Pt is on beta-blocker. Dr Ether Griffins ordered to check Magnesium and TSH levels and to inform her of any abnormal results. Order placed. Will continue to closely monitor.

## 2017-07-01 NOTE — Progress Notes (Signed)
[place] at [place] NAME: Jerry [name]    MR#:  [id number]  DATE OF BIRTH:  [birth year]  SUBJECTIVE: Patient was agitated and had hallucinations whole day yesterday. His morning afebrile, alert, denies any pain. Asking for back with warm water.   CHIEF COMPLAINT:   Chief Complaint  Patient presents with  . Code Sepsis   - Alert today, very slow to respond.  -Right upper extremity appears to be swollen   REVIEW OF SYSTEMS:  Review of Systems  Constitutional: Positive for malaise/fatigue. Negative for chills and fever.  HENT: Negative for congestion, ear discharge, hearing loss and nosebleeds.   Respiratory: Negative for cough, shortness of breath and wheezing.   Cardiovascular: Positive for leg swelling. Negative for chest pain and palpitations.  Gastrointestinal: Negative for abdominal pain, constipation, diarrhea, nausea and vomiting.  Genitourinary: Negative for dysuria.  Neurological: Negative for dizziness, speech change, focal weakness, seizures and headaches.  Psychiatric/Behavioral: Negative for depression.    DRUG ALLERGIES:   Allergies  Allergen Reactions  . Methadone   . Penicillins     .Has patient had a PCN reaction causing immediate rash, facial/tongue/throat swelling, SOB or lightheadedness with hypotension: Unknown Has patient had a PCN reaction causing severe rash involving mucus membranes or skin necrosis: Unknown Has patient had a PCN reaction that required hospitalization: Unknown Has patient had a PCN reaction occurring within the last 10 years: Unknown If all of the above answers are "NO", then may proceed with Cephalosporin use.   Marland Kitchen Propoxyphene   . Sulfa Antibiotics     VITALS:  Blood pressure (!) 150/82, pulse 78, temperature 98.9 F (37.2 C), resp. rate 19, height 6\' 1"  (1.854 m), weight (!) 142.4 kg (314 lb), SpO2 97 %.  PHYSICAL EXAMINATION:  Physical Exam  GENERAL:  61 y.o.-year-old Obese  patient lying in the bed, Alert, extubated and not in any acute distress  EYES: Pupils equal, round, reactive to light and accommodation. No scleral icterus. Erythematous conjunctiva. Extraocular muscles intact.  HEENT: Head atraumatic, normocephalic. Oropharynx and nasopharynx clear. Slight left-sided facial droop with increased pooling of saliva on the left side noted NECK:  Supple, no jugular venous distention. No thyroid enlargement, no tenderness.  LUNGS: Normal breath sounds bilaterally, no wheezing, rales or crepitation. No use of accessory muscles of respiration. Decreased bibasilar breath sounds with some rhonchi CARDIOVASCULAR: S1, S2 normal. No murmurs, rubs, or gallops.  ABDOMEN: Soft, obese, nontender, nondistended. Bowel sounds present. No organomegaly or mass.  EXTREMITIES: No cyanosis, or clubbing. 1+ pedal edema, chronic hyperpigmentation of both feet but dorsalis pedis palpable bilaterally, gangrene appearing -Right upper extremity is significantly swollen NEUROLOGIC: Alert , able to follow some simple commands, left upper extremity has 4/5 strength, bilateral lower extremities with 2/5 strength and right upper extremity has 1/5 strength with swelling noted.. Sensation is intact. Gait not tested PSYCHIATRIC: The patient is alert and oriented to self , very slow to respond SKIN: No obvious rash, lesion, or ulcer.    LABORATORY PANEL:   CBC  Recent Labs Lab 06/30/17 0456  WBC 6.3  HGB 9.2*  HCT 27.0*  PLT 142*   ------------------------------------------------------------------------------------------------------------------  Chemistries   Recent Labs Lab 06/26/17 0614  06/30/17 0456  NA 142  < > 140  K 3.9  < > 3.4*  CL 102  < > 106  CO2 35*  < > 27  GLUCOSE 101*  < > 79  BUN 27*  < > 8  CREATININE 0.49*  < > 0.54*  CALCIUM 8.3*  < > 8.2*  MG 2.2  --   --   AST  --   --  29  ALT  --   --  34  ALKPHOS  --   --  70  BILITOT  --   --  0.7  < > = values in  this interval not displayed. ------------------------------------------------------------------------------------------------------------------  Cardiac Enzymes  Recent Labs Lab 06/26/17 0614  TROPONINI 0.04*   ------------------------------------------------------------------------------------------------------------------  RADIOLOGY:  No results found.  EKG:   Orders placed or performed during the hospital encounter of 06/21/17  . EKG 12-Lead  . EKG 12-Lead  . EKG 12-Lead  . EKG 12-Lead  . EKG 12-Lead  . EKG 12-Lead  . EKG 12-Lead  . EKG 12-Lead  . EKG 12-Lead  . EKG 12-Lead  . EKG 12-Lead  . EKG 12-Lead  . EKG 12-Lead  . EKG 12-Lead  . EKG 12-Lead  . EKG 12-Lead  . EKG 12-Lead  . EKG 12-Lead    ASSESSMENT AND PLAN:   61 year old obese male with past medical history significant for schizophrenia, seizure disorder, hypothyroidism, rheumatoid arthritis from a nursing home was brought in secondary to unresponsive state, hypothermia.  #1 .sepsis-secondary to healthcare acquired pneumonia. -one set of blood cultures with coagulase-negative staphylococcus, likely contaminant. -Finished vancomycin and azithromycin And Tressie Ellis  -Since blood pressure was low, his blood pressure medications including Norvasc, Coreg, clonidine, torsemide were held-intially,.but restarted Coreg Yesterday, started Norvasc today at 5 mg.. Will resume other medicines slowly As  BP permits    - #2. acute respiratory failure-secondary to healthcare acquired pneumonia. Was ventilatory dependent. Extubated, now on room air saturations 95%.  -chest x-ray with left lower lobe infiltrate, follow-up chest x-ray with pulmonary edema. Restart torsemide.  -Echocardiogram with diastolic dysfunction, LVH, EF 65% -IV with one dose of IV Lasix prn. -Continue inhalers and finished antibiotics.  #3. acute thrombocytopenia-likely sepsis related and also medication induced. -.platelet count slowly improving  and >140 K now -could also be medication induced- was on valproic acid, Prolixin-both are currently discontinued  -Appreciate oncology consult. Monitor closely. No indication for transfusion at this time.  #4. acute encephalopathy-partially from sepsis and metabolic reasons.  -Initial CT head was negative, now he is more alert - monitor for now -He started his psych medicines including Klonopin, benzo atropine, valproic acid. Restart Prolixin slowly.  #. schizophrenia-started back on valproic acid, Klonopin yesterday. Hopefully his hallucinations will better be better in next 1-2 days once his psych medicines are effective .  #6 right upper extremity swelling- negative ultrasound for DVT. -  Disposition: Skilled nursing,   All the records are reviewed and case discussed with Care Management/Social Workerr. Management plans discussed with the patient, family and they are in agreement.  CODE STATUS: Full code  TOTAL TIME TAKING CARE OF THIS PATIENT: 36 minutes.    Epifanio Lesches M.D on 07/01/2017 at 9:17 AM  Between 7am to 6pm - Pager - [phone]  After 6pm go to [email or web address] - password [email or web address] [facility]  Office  [phone]  CC: Primary care physician; Patient, No Pcp Per

## 2017-07-01 NOTE — NC FL2 (Signed)
[place] LEVEL OF CARE SCREENING TOOL     IDENTIFICATION  Patient Name: Jerry [name] Birthdate: Feb 19, 1956 Sex: male Admission Date (Current Location): 06/21/2017  [place] and [place] Number:  Engineering geologist and Address:  [facility], [address], [place],  [address]      Provider Number: [id number]  Attending Physician Name and Address:  Epifanio Lesches, MD  Relative Name and Phone Number:       Current Level of Care: Hospital Recommended Level of Care: [place] Prior Approval Number:    Date Approved/Denied:   PASRR Number: [id number]  Discharge Plan: SNF    Current Diagnoses: Patient Active Problem List   Diagnosis Date Noted  . Septic shock ([place])   . Endotracheal tube present   . Urinary tract infection without hematuria   . Encounter for nasogastric (NG) tube placement   . Sepsis ([place]) 06/21/2017  . Healthcare-associated pneumonia 06/21/2017  . Acute respiratory failure with hypoxia and hypercapnia ([place]) 06/21/2017  . Acute encephalopathy 06/21/2017  . Thrombocytopenia ([place]) 06/21/2017  . Leukopenia 06/21/2017  . Pressure injury of skin 06/21/2017    Orientation RESPIRATION BLADDER Height & Weight     Self, Place  Normal Incontinent Weight: (!) 314 lb (142.4 kg) Height:  6\' 1"  (185.4 cm)  BEHAVIORAL SYMPTOMS/MOOD NEUROLOGICAL BOWEL NUTRITION STATUS      Continent Diet (Heart healthy)  AMBULATORY STATUS COMMUNICATION OF NEEDS Skin   Extensive Assist Verbally Normal                       Personal Care Assistance Level of Assistance  Bathing, Feeding, Dressing, Total care Bathing Assistance: Maximum assistance Feeding assistance: Independent Dressing Assistance: Maximum assistance Total Care Assistance: Limited assistance   Functional Limitations Info             SPECIAL CARE FACTORS FREQUENCY                       Contractures      Additional Factors  Info  Code Status, Allergies Code Status Info: Full Allergies Info: Carafate Sucralfate, Lipitor Atorvastatin, Mobic Meloxicam, Penicillins, Tramadol           Current Medications (07/01/2017):  This is the current hospital active medication list Current Facility-Administered Medications  Medication Dose Route Frequency Provider Last Rate Last Dose  . acetaminophen (TYLENOL) tablet 650 mg  650 mg Oral Q6H PRN Wilhelmina Mcardle, MD       Or  . acetaminophen (TYLENOL) suppository 650 mg  650 mg Rectal Q6H PRN Wilhelmina Mcardle, MD      . albuterol (PROVENTIL) (2.5 MG/3ML) 0.083% nebulizer solution 2.5 mg  2.5 mg Nebulization Q4H PRN Wilhelmina Mcardle, MD      . benztropine (COGENTIN) tablet 0.5 mg  0.5 mg Oral BID Epifanio Lesches, MD   0.5 mg at 07/01/17 3419  . bisacodyl (DULCOLAX) suppository 10 mg  10 mg Rectal Daily PRN Wilhelmina Mcardle, MD   10 mg at 06/26/17 0220  . carvedilol (COREG) tablet 25 mg  25 mg Oral BID WC Epifanio Lesches, MD   25 mg at 07/01/17 6222  . clonazePAM (KLONOPIN) tablet 1 mg  1 mg Oral QHS Epifanio Lesches, MD   1 mg at 06/30/17 2048  . divalproex (DEPAKOTE SPRINKLE) capsule 750 mg  750 mg Oral Q8H Epifanio Lesches, MD   750 mg at 07/01/17 0600  . haloperidol lactate (HALDOL)  injection 1-4 mg  1-4 mg Intravenous Q3H PRN Wilhelmina Mcardle, MD      . hydroxychloroquine (PLAQUENIL) tablet 200 mg  200 mg Oral BID Epifanio Lesches, MD   200 mg at 07/01/17 4239  . levothyroxine (SYNTHROID, LEVOTHROID) tablet 150 mcg  150 mcg Oral QAC breakfast Wilhelmina Mcardle, MD   150 mcg at 07/01/17 0600  . multivitamin with minerals tablet 1 tablet  1 tablet Oral Daily Gladstone Lighter, MD   1 tablet at 07/01/17 [phone]  . ondansetron (ZOFRAN) injection 4 mg  4 mg Intravenous Q6H PRN Theodoro Grist, MD      . protein supplement (PREMIER PROTEIN) liquid  11 oz Oral BID BM Gladstone Lighter, MD   11 oz at 07/01/17 1000  . sodium chloride flush (NS) 0.9 % injection 10-40  mL  10-40 mL Intracatheter PRN Gladstone Lighter, MD      . tamsulosin (FLOMAX) capsule 0.4 mg  0.4 mg Oral Daily Epifanio Lesches, MD   0.4 mg at 07/01/17 2334     Discharge Medications: Please see discharge summary for a list of discharge medications.  Relevant Imaging Results:  Relevant Lab Results:   Additional Information SS# [id number]  Zettie Pho, LCSW

## 2017-07-01 NOTE — Progress Notes (Signed)
Patient in bed, talking to himself. Cooperative. No signs of distress or complaints. Continue to monitor.

## 2017-07-02 MED ORDER — FLUPHENAZINE HCL 5 MG PO TABS
15.0000 mg | ORAL_TABLET | Freq: Every day | ORAL | Status: DC
  Filled 2017-07-02: qty 3

## 2017-07-02 MED ORDER — CLONAZEPAM 1 MG PO TABS: 1.0000 mg | ORAL_TABLET | Freq: Every day | ORAL | 0 refills | Status: DC

## 2017-07-02 MED ORDER — TORSEMIDE 20 MG PO TABS
10.0000 mg | ORAL_TABLET | Freq: Every day | ORAL | Status: DC
  Administered 2017-07-02: 10 mg via ORAL
  Filled 2017-07-02: qty 1

## 2017-07-02 MED ORDER — FLUPHENAZINE HCL 2.5 MG PO TABS: 12.5000 mg | ORAL_TABLET | Freq: Every day | ORAL | Status: DC

## 2017-07-02 MED ORDER — POTASSIUM CHLORIDE CRYS ER 20 MEQ PO TBCR
40.0000 meq | EXTENDED_RELEASE_TABLET | Freq: Once | ORAL | Status: AC
  Administered 2017-07-02: 40 meq via ORAL
  Filled 2017-07-02: qty 2

## 2017-07-02 MED ORDER — ENOXAPARIN SODIUM 40 MG/0.4ML ~~LOC~~ SOLN: 40.0000 mg | SUBCUTANEOUS | Status: DC

## 2017-07-02 MED ORDER — FLUPHENAZINE HCL 5 MG PO TABS
12.5000 mg | ORAL_TABLET | Freq: Every day | ORAL | Status: DC
  Administered 2017-07-02: 12.5 mg via ORAL
  Filled 2017-07-02: qty 3

## 2017-07-02 MED ORDER — FLUPHENAZINE HCL 2.5 MG PO TABS: 12.5000 mg | ORAL_TABLET | Freq: Two times a day (BID) | ORAL | Status: DC

## 2017-07-02 NOTE — NC FL2 (Signed)
[place] LEVEL OF CARE SCREENING TOOL     IDENTIFICATION  Patient Name: Jerry [name] Birthdate: [birth year] Sex: male Admission Date (Current Location): 06/21/2017  [place] and [place] Number:  Engineering geologist and Address:  [facility], [address], [place], [place] [address]      Provider Number: [id number]  Attending Physician Name and Address:  Max Sane, MD  Relative Name and Phone Number:       Current Level of Care: Hospital Recommended Level of Care: [place] Prior Approval Number:    Date Approved/Denied:   PASRR Number: [id number]  Discharge Plan: SNF    Current Diagnoses: Patient Active Problem List   Diagnosis Date Noted  . Septic shock ([place])   . Endotracheal tube present   . Urinary tract infection without hematuria   . Encounter for nasogastric (NG) tube placement   . Sepsis ([place]) 06/21/2017  . Healthcare-associated pneumonia 06/21/2017  . Acute respiratory failure with hypoxia and hypercapnia ([place]) 06/21/2017  . Acute encephalopathy 06/21/2017  . Thrombocytopenia ([place]) 06/21/2017  . Leukopenia 06/21/2017  . Pressure injury of skin 06/21/2017    Orientation RESPIRATION BLADDER Height & Weight     Self, Place  Normal Incontinent Weight: (!) 314 lb (142.4 kg) Height:  6\' 1"  (185.4 cm)  BEHAVIORAL SYMPTOMS/MOOD NEUROLOGICAL BOWEL NUTRITION STATUS      Continent Diet (Heart healthy)  AMBULATORY STATUS COMMUNICATION OF NEEDS Skin   Extensive Assist Verbally Normal                       Personal Care Assistance Level of Assistance  Bathing, Feeding, Dressing, Total care Bathing Assistance: Maximum assistance Feeding assistance: Independent Dressing Assistance: Maximum assistance Total Care Assistance: Limited assistance   Functional Limitations Info             SPECIAL CARE FACTORS FREQUENCY                       Contractures      Additional Factors Info   Code Status, Allergies Code Status Info: Full Allergies Info: Carafate Sucralfate, Lipitor Atorvastatin, Mobic Meloxicam, Penicillins, Tramadol           Current Medications (07/02/2017):  This is the current hospital active medication list Current Facility-Administered Medications  Medication Dose Route Frequency Provider Last Rate Last Dose  . acetaminophen (TYLENOL) tablet 650 mg  650 mg Oral Q6H PRN Wilhelmina Mcardle, MD       Or  . acetaminophen (TYLENOL) suppository 650 mg  650 mg Rectal Q6H PRN Wilhelmina Mcardle, MD      . albuterol (PROVENTIL) (2.5 MG/3ML) 0.083% nebulizer solution 2.5 mg  2.5 mg Nebulization Q4H PRN Wilhelmina Mcardle, MD      . benztropine (COGENTIN) tablet 0.5 mg  0.5 mg Oral BID Epifanio Lesches, MD   0.5 mg at 07/01/17 2209  . bisacodyl (DULCOLAX) suppository 10 mg  10 mg Rectal Daily PRN Wilhelmina Mcardle, MD   10 mg at 06/26/17 0220  . carvedilol (COREG) tablet 25 mg  25 mg Oral BID WC Epifanio Lesches, MD   25 mg at 07/01/17 1713  . clonazePAM (KLONOPIN) tablet 1 mg  1 mg Oral QHS Epifanio Lesches, MD   1 mg at 07/01/17 2208  . divalproex (DEPAKOTE SPRINKLE) capsule 750 mg  750 mg Oral Q8H Epifanio Lesches, MD   750 mg at 07/02/17 0538  . enoxaparin (LOVENOX) injection  40 mg  40 mg Subcutaneous Q24H Max Sane, MD      . fluPHENAZine (PROLIXIN) tablet 12.5 mg  12.5 mg Oral Daily Max Sane, MD       And  . fluPHENAZine (PROLIXIN) tablet 15 mg  15 mg Oral QHS Shah, Vipul, MD      . haloperidol lactate (HALDOL) injection 1-4 mg  1-4 mg Intravenous Q3H PRN Wilhelmina Mcardle, MD      . hydroxychloroquine (PLAQUENIL) tablet 200 mg  200 mg Oral BID Epifanio Lesches, MD   200 mg at 07/01/17 2209  . levothyroxine (SYNTHROID, LEVOTHROID) tablet 150 mcg  150 mcg Oral QAC breakfast Wilhelmina Mcardle, MD   150 mcg at 07/02/17 0538  . multivitamin with minerals tablet 1 tablet  1 tablet Oral Daily Gladstone Lighter, MD   1 tablet at 07/01/17 [phone]  .  ondansetron (ZOFRAN) injection 4 mg  4 mg Intravenous Q6H PRN Theodoro Grist, MD      . potassium chloride SA (K-DUR,KLOR-CON) CR tablet 40 mEq  40 mEq Oral Once Max Sane, MD      . protein supplement (PREMIER PROTEIN) liquid  11 oz Oral BID BM Gladstone Lighter, MD   11 oz at 07/01/17 1400  . sodium chloride flush (NS) 0.9 % injection 10-40 mL  10-40 mL Intracatheter PRN Gladstone Lighter, MD      . tamsulosin (FLOMAX) capsule 0.4 mg  0.4 mg Oral Daily Epifanio Lesches, MD   0.4 mg at 07/01/17 8841  . torsemide (DEMADEX) tablet 10 mg  10 mg Oral Daily Max Sane, MD         Discharge Medications: Please see discharge summary for a list of discharge medications.  Relevant Imaging Results:  Relevant Lab Results:   Additional Information SS# [id number]  Wilian Kwong, Veronia Beets, [place]

## 2017-07-02 NOTE — Progress Notes (Signed)
Report called Elmyra Ricks at [facility], EMS called for tranportation.

## 2017-07-02 NOTE — Progress Notes (Signed)
EMS here to transport pt. 

## 2017-07-02 NOTE — Discharge Instructions (Signed)

## 2017-07-02 NOTE — Progress Notes (Signed)
Pts HR bouncing up to the 150's (nonsustained), MD. Per MD can stay on 1A, no new orders at this time.

## 2017-07-02 NOTE — Progress Notes (Signed)
[place] [facility] [place] NAME: Jerry [name]    MR#:  [id number]  DATE OF BIRTH:  1956-07-25  SUBJECTIVE: Patient was agitated and had hallucinations whole day yesterday. His morning afebrile, alert, denies any pain. Asking for back with warm water.   CHIEF COMPLAINT:   Chief Complaint  Patient presents with  . Code Sepsis  Pt disoriented and talking to self, noted to have tremors on the upper extremities. Had transient tachycardia last evening with Heart rate per monitor on 180's, sustained for less than 30 seconds, it came right back on the 70's.  REVIEW OF SYSTEMS:  Review of Systems  Constitutional: Positive for malaise/fatigue. Negative for chills, fever and weight loss.  HENT: Negative for congestion, ear discharge, hearing loss, nosebleeds and sore throat.   Eyes: Negative for blurred vision.  Respiratory: Negative for cough, shortness of breath and wheezing.   Cardiovascular: Negative for chest pain, palpitations, orthopnea, leg swelling and PND.  Gastrointestinal: Negative for abdominal pain, constipation, diarrhea, heartburn, nausea and vomiting.  Genitourinary: Negative for dysuria and urgency.  Musculoskeletal: Negative for back pain.  Skin: Negative for rash.  Neurological: Positive for weakness. Negative for dizziness, speech change, focal weakness, seizures and headaches.  Endo/Heme/Allergies: Does not bruise/bleed easily.  Psychiatric/Behavioral: Positive for hallucinations. Negative for depression.   DRUG ALLERGIES:   Allergies  Allergen Reactions  . Methadone   . Penicillins     .Has patient had a PCN reaction causing immediate rash, facial/tongue/throat swelling, SOB or lightheadedness with hypotension: Unknown Has patient had a PCN reaction causing severe rash involving mucus membranes or skin necrosis: Unknown Has patient had a PCN reaction that required hospitalization: Unknown Has patient had a PCN reaction occurring  within the last 10 years: Unknown If all of the above answers are "NO", then may proceed with Cephalosporin use.   Marland Kitchen Propoxyphene   . Sulfa Antibiotics     VITALS:  Blood pressure 118/76, pulse 75, temperature 97.7 F (36.5 C), temperature source Oral, resp. rate 20, height 6\' 1"  (1.854 m), weight (!) 142.4 kg (314 lb), SpO2 100 %.  PHYSICAL EXAMINATION:  Physical Exam  GENERAL:  61 y.o.-year-old Obese patient lying in the bed, Alert, in No acute distress  EYES: Pupils equal, round, reactive to light and accommodation. No scleral icterus. Erythematous conjunctiva. Extraocular muscles intact.  HEENT: Head atraumatic, normocephalic. Oropharynx and nasopharynx clear. Slight left-sided facial droop with increased pooling of saliva on the left side noted NECK:  Supple, no jugular venous distention. No thyroid enlargement, no tenderness.  LUNGS: Normal breath sounds bilaterally, no wheezing, rales or crepitation. No use of accessory muscles of respiration. Decreased bibasilar breath sounds with some rhonchi CARDIOVASCULAR: S1, S2 normal. No murmurs, rubs, or gallops.  ABDOMEN: Soft, obese, nontender, nondistended. Bowel sounds present. No organomegaly or mass.  EXTREMITIES: No cyanosis, or clubbing. 1+ pedal edema, chronic hyperpigmentation of both feet but dorsalis pedis palpable bilaterally, gangrene appearing -Right upper extremity is significantly swollen NEUROLOGIC: Alert , able to follow some simple commands, left upper extremity has 4/5 strength, bilateral lower extremities with 2/5 strength and right upper extremity has 1/5 strength with swelling noted.. Sensation is intact. Gait not tested PSYCHIATRIC: The patient is alert and oriented to self , very slow to respond.  He is seeing things which are not there in the room SKIN: No obvious rash, lesion, or ulcer.  LABORATORY PANEL:   CBC  Recent Labs Lab 06/30/17 0456  WBC 6.3  HGB 9.2*  HCT 27.0*  PLT 142*    ------------------------------------------------------------------------------------------------------------------  Chemistries   Recent Labs Lab 06/30/17 0456 07/01/17 2106  NA 140  --   K 3.4*  --   CL 106  --   CO2 27  --   GLUCOSE 79  --   BUN 8  --   CREATININE 0.54*  --   CALCIUM 8.2*  --   MG  --  2.0  AST 29  --   ALT 34  --   ALKPHOS 70  --   BILITOT 0.7  --    ------------------------------------------------------------------------------------------------------------------  Cardiac Enzymes  Recent Labs Lab 06/26/17 0614  TROPONINI 0.04*    ASSESSMENT AND PLAN:  61 year old obese male with past medical history significant for schizophrenia, seizure disorder, hypothyroidism, rheumatoid arthritis from a nursing home was brought in secondary to unresponsive state, hypothermia.  #1 .sepsis-secondary to healthcare acquired pneumonia. -one set of blood cultures with coagulase-negative staphylococcus, likely contaminant. -Finished vancomycin and azithromycin And Tressie Ellis  -Since blood pressure was low, his blood pressure medications including Norvasc, Coreg, clonidine, torsemide were held-intially,.but restarted Coreg On 8/4  - Will resume other medicines slowly As  BP permits   #2. acute respiratory failure-secondary to healthcare acquired pneumonia. Was ventilatory dependent. Extubated, now on room air saturations 95%.  -chest x-ray with left lower lobe infiltrate, follow-up chest x-ray with pulmonary edema. Restart torsemide on 8/6  -Echocardiogram with diastolic dysfunction, LVH, EF 65% -Continue inhalers and finished antibiotics.  #3. acute thrombocytopenia-likely sepsis related and also medication induced. -.platelet count slowly improving and >140 K now -could also be medication induced- was on valproic acid, Prolixin-both are currently discontinued  -Appreciate oncology consult. Monitor closely. No indication for transfusion at this time.  #4. acute  encephalopathy-partially from sepsis and metabolic reasons.  -Initial CT head was negative, now he is more alert - monitor for now -He started his psych medicines including Klonopin, benzo atropine, valproic acid. Restart Prolixin today - Psych consultation  #. schizophrenia-started back on valproic acid, Klonopin yesterday. Hopefully his hallucinations will better be better in next 1-2 days once his psych medicines are effective .  #6 right upper extremity swelling- negative ultrasound for DVT. -  Disposition: Skilled nursing,   All the records are reviewed and case discussed with Care Management/Social Workerr. Management plans discussed with the patient, nursing and they are in agreement.  CODE STATUS: Full code  TOTAL TIME TAKING CARE OF THIS PATIENT: 36 minutes.    Max Sane M.D on 07/02/2017 at 7:38 AM  Between 7am to 6pm - Pager - [phone]  After 6pm go to [email or web address] - password [email or web address]  Office  [phone]  CC: Primary care physician; Patient, No Pcp Per

## 2017-07-02 NOTE — Discharge Summary (Signed)
[facility] NAME: [name]    MR#:  [id number]  DATE OF BIRTH:  [birth year]  DATE OF ADMISSION:  06/21/2017   ADMITTING PHYSICIAN: Theodoro Grist, MD  DATE OF DISCHARGE: 07/02/2017  PRIMARY CARE PHYSICIAN: [name], MD   ADMISSION DIAGNOSIS:  Thrombocytopenia ([place]) [D69.6] Encounter for nasogastric (NG) tube placement [Z46.59] Hypothermia, initial encounter [T68.XXXA] Endotracheal tube present [Z97.8] Acute respiratory failure, unspecified whether with hypoxia or hypercapnia (HCC) [J96.00] Urinary tract infection without hematuria, site unspecified [N39.0] Altered mental status, unspecified altered mental status type [R41.82] DISCHARGE DIAGNOSIS:  Active Problems:   Sepsis ([place])   Healthcare-associated pneumonia   Acute respiratory failure with hypoxia and hypercapnia (HCC)   Acute encephalopathy   Thrombocytopenia (HCC)   Leukopenia   Pressure injury of skin   Endotracheal tube present   Urinary tract infection without hematuria   Encounter for nasogastric (NG) tube placement   Septic shock ([place])  SECONDARY DIAGNOSIS:   Past Medical History:  Diagnosis Date  . BPH (benign prostatic hyperplasia)   . CHF (congestive heart failure) ([place])   . Chronic pain   . COPD (chronic obstructive pulmonary disease) ([place])   . Hypertension   . Morbid obesity ([place])   . Pressure ulcer   . Rheumatoid arthritis ([place])   . Schizophrenia ([place])   . Thyroid disease    HOSPITAL COURSE:  61 year old obese male with past medical history significant for schizophrenia, seizure disorder, hypothyroidism, rheumatoid arthritis from a nursing home was brought in secondary to unresponsive state, hypothermia.  #1 Sepsis-secondary to healthcare acquired pneumonia. -one set of blood cultures with coagulase-negative staphylococcus, likely contaminant. -Finished Antibotics  #2. Acute respiratory failure-secondary to healthcare acquired  pneumonia. Was ventilatory dependent. Extubated, now on room air saturations 95%. -Echocardiogram with diastolic dysfunction, LVH, EF 65%  #3. Acute thrombocytopenia-likely sepsis related and also medication induced. -.platelet count slowly improving and >140 K now -could also be medication induced- was on valproic acid,  - After discussion with family, patient's valproic acid is being stopped, and I am cutting back on the dose of Prolixin.   #4. Acute metabolic encephalopathy-partially from sepsis and metabolic reasons. - History has some hallucination at time.  Likely from underlying psychiatric problem  #. schizophrenia-started back on Klonopin yesterday. Hopefully his hallucinations will better be better in next 1-2 days once his psych medicines are effective  - After discussion with family, patient's valproic acid is being stopped, and I am cutting back on the dose of Prolixin.  - With adjustment in psychiatric medication per his psychiatrist at [facility]  #6 right upper extremity swelling- negative ultrasound for DVT.  Elevated the upper extremity.  While in the bed  #7 Conjunctival hemorrhage: Could be from acute thrombocytopenia, which could have been from Depakote and/or sepsis - Out patient eye evaluation with [facility]  DISCHARGE CONDITIONS:  stable CONSULTS OBTAINED:  Treatment Team:  Lequita Asal, MD [place], Madie Ren[place], MD DRUG ALLERGIES:   Allergies  Allergen Reactions  . Methadone   . Penicillins     .Has patient had a PCN reaction causing immediate rash, facial/tongue/throat swelling, SOB or lightheadedness with hypotension: Unknown Has patient had a PCN reaction causing severe rash involving mucus membranes or skin necrosis: Unknown Has patient had a PCN reaction that required hospitalization: Unknown Has patient had a PCN reaction occurring within the last 10 years: Unknown If all of the above answers are "NO", then may proceed  with  Cephalosporin use.   Marland Kitchen Propoxyphene   . Sulfa Antibiotics    DISCHARGE MEDICATIONS:   Allergies as of 07/02/2017      Reactions   Methadone    Penicillins    .Has patient had a PCN reaction causing immediate rash, facial/tongue/throat swelling, SOB or lightheadedness with hypotension: Unknown Has patient had a PCN reaction causing severe rash involving mucus membranes or skin necrosis: Unknown Has patient had a PCN reaction that required hospitalization: Unknown Has patient had a PCN reaction occurring within the last 10 years: Unknown If all of the above answers are "NO", then may proceed with Cephalosporin use.   Propoxyphene    Sulfa Antibiotics       Medication List    STOP taking these medications   divalproex 125 MG capsule Commonly known as:  DEPAKOTE SPRINKLE   traMADol 50 MG tablet Commonly known as:  ULTRAM     TAKE these medications   albuterol 108 (90 Base) MCG/ACT inhaler Commonly known as:  PROVENTIL HFA;VENTOLIN HFA Inhale 1 puff into the lungs every 3 (three) hours as needed for wheezing or shortness of breath.   amLODipine 10 MG tablet Commonly known as:  NORVASC Take 10 mg by mouth daily.   benztropine 0.5 MG tablet Commonly known as:  COGENTIN Take 0.5 mg by mouth 2 (two) times daily.   carvedilol 25 MG tablet Commonly known as:  COREG Take 25 mg by mouth 2 (two) times daily with a meal.   clonazePAM 1 MG tablet Commonly known as:  KLONOPIN Take 1 tablet (1 mg total) by mouth at bedtime.   cloNIDine 0.3 mg/24hr patch Commonly known as:  CATAPRES - Dosed in mg/24 hr Place 0.3 mg onto the skin once a week. Tuesday   docusate sodium 100 MG capsule Commonly known as:  COLACE Take 100 mg by mouth 2 (two) times daily.   fluPHENAZine 2.5 MG tablet Commonly known as:  PROLIXIN Take 5 tablets (12.5 mg total) by mouth daily. Take 12.5 mg by mouth in the morning and 15 mg by mouth at bedtime. What changed:  how much to take  when to take this     hydroxychloroquine 200 MG tablet Commonly known as:  PLAQUENIL Take 200 mg by mouth 2 (two) times daily.   levothyroxine 150 MCG tablet Commonly known as:  SYNTHROID, LEVOTHROID Take 150 mcg by mouth daily before breakfast.   methocarbamol 750 MG tablet Commonly known as:  ROBAXIN Take 750 mg by mouth at bedtime.   MIRALAX PO Take 17 g by mouth daily.   multivitamin with minerals tablet Take 1 tablet by mouth daily.   potassium chloride 10 MEQ CR capsule Commonly known as:  MICRO-K Take 10 mEq by mouth daily.   tamsulosin 0.4 MG Caps capsule Commonly known as:  FLOMAX Take 0.4 mg by mouth daily.   torsemide 10 MG tablet Commonly known as:  DEMADEX Take 10 mg by mouth daily.   traZODone 150 MG tablet Commonly known as:  DESYREL Take 150 mg by mouth at bedtime.      DISCHARGE INSTRUCTIONS:  Diet recommendations: Dysphagia 2 (fine chop);Thin liquid (w/ added purees) Liquids provided via: Cup;Straw Medication Administration: Crushed with puree Supervision: Staff to assist with self feeding;Full supervision/cueing for compensatory strategies Compensations: Minimize environmental distractions;Slow rate;Lingual sweep for clearance of pocketing;Small sips/bites;Multiple dry swallows after each bite/sip;Follow solids with liquid Postural Changes and/or Swallow Maneuvers: Seated upright 90 degrees;Upright 30-60 min after meal DIET:  Regular diet DISCHARGE  CONDITION:  Fair ACTIVITY:  Activity as tolerated OXYGEN:  Home Oxygen: No.  Oxygen Delivery: room air DISCHARGE LOCATION:  nursing home   If you experience worsening of your admission symptoms, develop shortness of breath, life threatening emergency, suicidal or homicidal thoughts you must seek medical attention immediately by calling 911 or calling your MD immediately  if symptoms less severe.  You Must read complete instructions/literature along with all the possible adverse reactions/side effects for all the  Medicines you take and that have been prescribed to you. Take any new Medicines after you have completely understood and accpet all the possible adverse reactions/side effects.   Please note  You were cared for by a hospitalist during your hospital stay. If you have any questions about your discharge medications or the care you received while you were in the hospital after you are discharged, you can call the unit and asked to speak with the hospitalist on call if the hospitalist that took care of you is not available. Once you are discharged, your primary care physician will handle any further medical issues. Please note that NO REFILLS for any discharge medications will be authorized once you are discharged, as it is imperative that you return to your primary care physician (or establish a relationship with a primary care physician if you do not have one) for your aftercare needs so that they can reassess your need for medications and monitor your lab values.    On the day of Discharge:  VITAL SIGNS:  Blood pressure 138/78, pulse 75, temperature 97.6 F (36.4 C), temperature source Oral, resp. rate 20, height 6\' 1"  (1.854 m), weight (!) 142.4 kg (314 lb), SpO2 100 %. PHYSICAL EXAMINATION:  GENERAL:  61 y.o.-year-old Obese patient lying in the bed, Alert, in No acute distress  EYES: Pupils equal, round, reactive to light and accommodation. No scleral icterus. Erythematous conjunctiva. Extraocular muscles intact.  HEENT: Head atraumatic, normocephalic. Oropharynx and nasopharynx clear. Slight left-sided facial droop with increased pooling of saliva on the left side noted NECK:  Supple, no jugular venous distention. No thyroid enlargement, no tenderness.  LUNGS: Normal breath sounds bilaterally, no wheezing, rales or crepitation. No use of accessory muscles of respiration. Decreased bibasilar breath sounds with some rhonchi CARDIOVASCULAR: S1, S2 normal. No murmurs, rubs, or gallops.  ABDOMEN:  Soft, obese, nontender, nondistended. Bowel sounds present. No organomegaly or mass.  EXTREMITIES: No cyanosis, or clubbing. 1+ pedal edema, chronic hyperpigmentation of both feet but dorsalis pedis palpable bilaterally, gangrene appearing -Right upper extremity is significantly swollen NEUROLOGIC: Alert , able to follow some simple commands, left upper extremity has 4/5 strength, bilateral lower extremities with 2/5 strength and right upper extremity has 1/5 strength with swelling noted.. Sensation is intact. Gait not tested PSYCHIATRIC: The patient is alert and oriented to self , very slow to respond.  He is seeing things which are not there in the room SKIN: No obvious rash, lesion, or ulcer.  DATA REVIEW:   CBC  Recent Labs Lab 06/30/17 0456  WBC 6.3  HGB 9.2*  HCT 27.0*  PLT 142*    Chemistries   Recent Labs Lab 06/30/17 0456 07/01/17 2106  NA 140  --   K 3.4*  --   CL 106  --   CO2 27  --   GLUCOSE 79  --   BUN 8  --   CREATININE 0.54*  --   CALCIUM 8.2*  --   MG  --  2.0  AST 29  --  ALT 34  --   ALKPHOS 70  --   BILITOT 0.7  --      Contact information for follow-up providers    [name], MD. Schedule an appointment as soon as possible for a visit in 1 week(s).   Specialty:  Family Medicine Contact information: [address] [place] [address] [phone]        Birder Robson, MD. Schedule an appointment as soon as possible for a visit in 1 week(s).   Specialty:  Ophthalmology Contact information: [place] [address] [phone]            Contact information for after-discharge care    Destination    [facility] Follow up.   Specialty:  [place] information: [address] [place] [phone]                   Management plans discussed with the patient, family and they are in agreement.  CODE STATUS: Full Code   TOTAL  TIME TAKING CARE OF THIS PATIENT: 45 minutes.    Max Sane M.D on 07/02/2017 at 9:03 AM  Between 7am to 6pm - Pager - [phone]  After 6pm go to [email or web address] - Proofreader  [facility]  Office  [phone]  CC: Primary care physician; [name], MD   Note: This dictation was prepared with Dragon dictation along with smaller phrase technology. Any transcriptional errors that result from this process are unintentional.

## 2017-07-02 NOTE — Progress Notes (Signed)
Orders received by MD [name] to remove PICC and keep foley cath in place, [place] [facility] notified. Marland Kitchen

## 2017-07-02 NOTE — Progress Notes (Signed)
Patient is medically stable for D/C back to [name] today. Per [name] liaison patient can return today to room 304-B. RN will call report to RN Theressa Stamps at [phone] and arrange EMS for transport. Clinical Education officer, museum (CSW) sent D/C orders to [name] via HUB. CSW contacted patient's niece [name] and made her aware of above. CSW met with patient and made him aware of above. Please reconsult if future social work needs arise. CSW signing off.   [employer], LCSW [phone]

## 2017-07-02 NOTE — Progress Notes (Signed)
Clinical social worker notified nurse that pt is requesting to speak with MD about concerns regarding discharge. MD [name] notified and came to room to speak with pt. Pt unable to communicate his concerns, and niece [name] notified. [name] spoke with pt and explained to him that he was discharging back to [facility] today. Pt is ok to discharge.

## 2017-08-02 ENCOUNTER — Encounter: Payer: Self-pay | Admitting: Emergency Medicine

## 2017-08-02 ENCOUNTER — Inpatient Hospital Stay
Admission: EM | Admit: 2017-08-02 | Discharge: 2017-08-13 | DRG: 871 | Disposition: A | Discharge status: Skilled Nursing Facility | Payer: Medicaid Other | Attending: Internal Medicine | Admitting: Internal Medicine

## 2017-08-02 ENCOUNTER — Emergency Department: Payer: Medicaid Other

## 2017-08-02 DIAGNOSIS — A411 Sepsis due to other specified staphylococcus: Secondary | ICD-10-CM | POA: Diagnosis present

## 2017-08-02 DIAGNOSIS — B9620 Unspecified Escherichia coli [E. coli] as the cause of diseases classified elsewhere: Secondary | ICD-10-CM | POA: Diagnosis present

## 2017-08-02 DIAGNOSIS — R001 Bradycardia, unspecified: Secondary | ICD-10-CM | POA: Diagnosis present

## 2017-08-02 DIAGNOSIS — D6959 Other secondary thrombocytopenia: Secondary | ICD-10-CM | POA: Diagnosis present

## 2017-08-02 DIAGNOSIS — I110 Hypertensive heart disease with heart failure: Secondary | ICD-10-CM | POA: Diagnosis present

## 2017-08-02 DIAGNOSIS — Z1619 Resistance to other specified beta lactam antibiotics: Secondary | ICD-10-CM | POA: Diagnosis present

## 2017-08-02 DIAGNOSIS — T426X5A Adverse effect of other antiepileptic and sedative-hypnotic drugs, initial encounter: Secondary | ICD-10-CM | POA: Diagnosis present

## 2017-08-02 DIAGNOSIS — D696 Thrombocytopenia, unspecified: Secondary | ICD-10-CM | POA: Diagnosis not present

## 2017-08-02 DIAGNOSIS — Z515 Encounter for palliative care: Secondary | ICD-10-CM | POA: Diagnosis not present

## 2017-08-02 DIAGNOSIS — E873 Alkalosis: Secondary | ICD-10-CM | POA: Diagnosis present

## 2017-08-02 DIAGNOSIS — E039 Hypothyroidism, unspecified: Secondary | ICD-10-CM | POA: Diagnosis present

## 2017-08-02 DIAGNOSIS — A419 Sepsis, unspecified organism: Secondary | ICD-10-CM | POA: Diagnosis not present

## 2017-08-02 DIAGNOSIS — M069 Rheumatoid arthritis, unspecified: Secondary | ICD-10-CM | POA: Diagnosis present

## 2017-08-02 DIAGNOSIS — R1310 Dysphagia, unspecified: Secondary | ICD-10-CM | POA: Diagnosis present

## 2017-08-02 DIAGNOSIS — I9589 Other hypotension: Secondary | ICD-10-CM | POA: Diagnosis present

## 2017-08-02 DIAGNOSIS — G9341 Metabolic encephalopathy: Secondary | ICD-10-CM | POA: Diagnosis present

## 2017-08-02 DIAGNOSIS — Z7189 Other specified counseling: Secondary | ICD-10-CM

## 2017-08-02 DIAGNOSIS — R6520 Severe sepsis without septic shock: Secondary | ICD-10-CM

## 2017-08-02 DIAGNOSIS — E160 Drug-induced hypoglycemia without coma: Secondary | ICD-10-CM | POA: Diagnosis present

## 2017-08-02 DIAGNOSIS — Z452 Encounter for adjustment and management of vascular access device: Secondary | ICD-10-CM

## 2017-08-02 DIAGNOSIS — Z6841 Body Mass Index (BMI) 40.0 and over, adult: Secondary | ICD-10-CM | POA: Diagnosis not present

## 2017-08-02 DIAGNOSIS — I5032 Chronic diastolic (congestive) heart failure: Secondary | ICD-10-CM | POA: Diagnosis present

## 2017-08-02 DIAGNOSIS — R4182 Altered mental status, unspecified: Secondary | ICD-10-CM

## 2017-08-02 DIAGNOSIS — E875 Hyperkalemia: Secondary | ICD-10-CM | POA: Diagnosis present

## 2017-08-02 DIAGNOSIS — F209 Schizophrenia, unspecified: Secondary | ICD-10-CM | POA: Diagnosis present

## 2017-08-02 DIAGNOSIS — Z1612 Extended spectrum beta lactamase (ESBL) resistance: Secondary | ICD-10-CM | POA: Diagnosis present

## 2017-08-02 DIAGNOSIS — J449 Chronic obstructive pulmonary disease, unspecified: Secondary | ICD-10-CM | POA: Diagnosis present

## 2017-08-02 DIAGNOSIS — R6521 Severe sepsis with septic shock: Secondary | ICD-10-CM | POA: Diagnosis present

## 2017-08-02 DIAGNOSIS — Z79899 Other long term (current) drug therapy: Secondary | ICD-10-CM

## 2017-08-02 DIAGNOSIS — N3090 Cystitis, unspecified without hematuria: Secondary | ICD-10-CM | POA: Diagnosis present

## 2017-08-02 DIAGNOSIS — Z880 Allergy status to penicillin: Secondary | ICD-10-CM

## 2017-08-02 DIAGNOSIS — B952 Enterococcus as the cause of diseases classified elsewhere: Secondary | ICD-10-CM | POA: Diagnosis present

## 2017-08-02 DIAGNOSIS — J9601 Acute respiratory failure with hypoxia: Secondary | ICD-10-CM | POA: Diagnosis present

## 2017-08-02 DIAGNOSIS — D72819 Decreased white blood cell count, unspecified: Secondary | ICD-10-CM | POA: Diagnosis not present

## 2017-08-02 DIAGNOSIS — Z882 Allergy status to sulfonamides status: Secondary | ICD-10-CM

## 2017-08-02 DIAGNOSIS — N400 Enlarged prostate without lower urinary tract symptoms: Secondary | ICD-10-CM | POA: Diagnosis present

## 2017-08-02 DIAGNOSIS — R680 Hypothermia, not associated with low environmental temperature: Secondary | ICD-10-CM | POA: Diagnosis present

## 2017-08-02 DIAGNOSIS — I509 Heart failure, unspecified: Secondary | ICD-10-CM

## 2017-08-02 LAB — CBC WITH DIFFERENTIAL/PLATELET
BASOS ABS: 0 10*3/uL (ref 0–0.1)
Basophils Relative: 0 %
EOS ABS: 0.1 10*3/uL (ref 0–0.7)
Eosinophils Relative: 6 %
HCT: 34.3 % — ABNORMAL LOW (ref 40.0–52.0)
Hemoglobin: 11.4 g/dL — ABNORMAL LOW (ref 13.0–18.0)
LYMPHS ABS: 0.9 10*3/uL — AB (ref 1.0–3.6)
Lymphocytes Relative: 40 %
MCH: 29.5 pg (ref 26.0–34.0)
MCHC: 33.2 g/dL (ref 32.0–36.0)
MCV: 88.6 fL (ref 80.0–100.0)
MONO ABS: 0.2 10*3/uL (ref 0.2–1.0)
Monocytes Relative: 8 %
NEUTROS ABS: 1 10*3/uL — AB (ref 1.4–6.5)
Neutrophils Relative %: 46 %
PLATELETS: 32 10*3/uL — AB (ref 150–440)
RBC: 3.87 MIL/uL — ABNORMAL LOW (ref 4.40–5.90)
RDW: 17.8 % — ABNORMAL HIGH (ref 11.5–14.5)
WBC: 2.2 10*3/uL — ABNORMAL LOW (ref 3.8–10.6)

## 2017-08-02 LAB — PROCALCITONIN

## 2017-08-02 LAB — COMPREHENSIVE METABOLIC PANEL
ALK PHOS: 92 U/L (ref 38–126)
ALT: 35 U/L (ref 17–63)
AST: 38 U/L (ref 15–41)
Albumin: 3 g/dL — ABNORMAL LOW (ref 3.5–5.0)
Anion gap: 7 (ref 5–15)
BUN: 35 mg/dL — ABNORMAL HIGH (ref 6–20)
CALCIUM: 9.3 mg/dL (ref 8.9–10.3)
CHLORIDE: 100 mmol/L — AB (ref 101–111)
CO2: 36 mmol/L — ABNORMAL HIGH (ref 22–32)
CREATININE: 0.65 mg/dL (ref 0.61–1.24)
Glucose, Bld: 75 mg/dL (ref 65–99)
Potassium: 4.4 mmol/L (ref 3.5–5.1)
Sodium: 143 mmol/L (ref 135–145)
Total Bilirubin: 0.3 mg/dL (ref 0.3–1.2)
Total Protein: 7.2 g/dL (ref 6.5–8.1)

## 2017-08-02 LAB — URINALYSIS, COMPLETE (UACMP) WITH MICROSCOPIC
BILIRUBIN URINE: NEGATIVE
Glucose, UA: NEGATIVE mg/dL
Hgb urine dipstick: NEGATIVE
KETONES UR: NEGATIVE mg/dL
Nitrite: NEGATIVE
PH: 5 (ref 5.0–8.0)
Protein, ur: NEGATIVE mg/dL
Specific Gravity, Urine: 1.018 (ref 1.005–1.030)

## 2017-08-02 LAB — GLUCOSE, CAPILLARY
GLUCOSE-CAPILLARY: 71 mg/dL (ref 65–99)
GLUCOSE-CAPILLARY: 66 mg/dL (ref 65–99)
GLUCOSE-CAPILLARY: 114 mg/dL — AB (ref 65–99)
GLUCOSE-CAPILLARY: 75 mg/dL (ref 65–99)
GLUCOSE-CAPILLARY: 86 mg/dL (ref 65–99)
Glucose-Capillary: 59 mg/dL — ABNORMAL LOW (ref 65–99)
Glucose-Capillary: 70 mg/dL (ref 65–99)
Glucose-Capillary: 107 mg/dL — ABNORMAL HIGH (ref 65–99)
Glucose-Capillary: 78 mg/dL (ref 65–99)
Glucose-Capillary: 84 mg/dL (ref 65–99)

## 2017-08-02 LAB — CORTISOL: Cortisol, Plasma: 9.8 ug/dL

## 2017-08-02 LAB — APTT: aPTT: 50 seconds — ABNORMAL HIGH (ref 24–36)

## 2017-08-02 LAB — LACTIC ACID, PLASMA: LACTIC ACID, VENOUS: 1 mmol/L (ref 0.5–1.9)

## 2017-08-02 LAB — MRSA PCR SCREENING: MRSA by PCR: NEGATIVE

## 2017-08-02 LAB — PROTIME-INR
INR: 1.13
Prothrombin Time: 14.4 seconds (ref 11.4–15.2)

## 2017-08-02 LAB — LIPASE, BLOOD: LIPASE: 23 U/L (ref 11–51)

## 2017-08-02 LAB — TROPONIN I

## 2017-08-02 MED ORDER — TAMSULOSIN HCL 0.4 MG PO CAPS
0.4000 mg | ORAL_CAPSULE | Freq: Every day | ORAL | Status: DC
Start: 2017-08-03 — End: 2017-08-13
  Administered 2017-08-03 – 2017-08-13 (×10): 0.4 mg via ORAL
  Filled 2017-08-02 (×10): qty 1

## 2017-08-02 MED ORDER — FLUPHENAZINE HCL 5 MG PO TABS
15.0000 mg | ORAL_TABLET | Freq: Every day | ORAL | Status: DC
  Administered 2017-08-02 – 2017-08-03 (×2): 15 mg via ORAL
  Filled 2017-08-02 (×3): qty 3
  Filled 2017-08-02: qty 2

## 2017-08-02 MED ORDER — DEXTROSE 5 % IV SOLN
2.0000 g | Freq: Once | INTRAVENOUS | Status: AC
  Administered 2017-08-02: 2 g via INTRAVENOUS
  Filled 2017-08-02: qty 2

## 2017-08-02 MED ORDER — CLONAZEPAM 1 MG PO TABS
1.0000 mg | ORAL_TABLET | Freq: Every day | ORAL | Status: DC
  Administered 2017-08-02 – 2017-08-03 (×2): 1 mg via ORAL
  Filled 2017-08-02 (×2): qty 1

## 2017-08-02 MED ORDER — ADULT MULTIVITAMIN W/MINERALS CH
1.0000 | ORAL_TABLET | Freq: Every day | ORAL | Status: DC
  Administered 2017-08-03 – 2017-08-13 (×10): 1 via ORAL
  Filled 2017-08-02 (×10): qty 1

## 2017-08-02 MED ORDER — DEXTROSE 10 % IV SOLN
INTRAVENOUS | Status: DC
  Administered 2017-08-02 – 2017-08-11 (×10): via INTRAVENOUS

## 2017-08-02 MED ORDER — BENZTROPINE MESYLATE 0.5 MG PO TABS
0.5000 mg | ORAL_TABLET | Freq: Two times a day (BID) | ORAL | Status: DC
  Administered 2017-08-02 – 2017-08-13 (×19): 0.5 mg via ORAL
  Filled 2017-08-02 (×24): qty 1

## 2017-08-02 MED ORDER — ALBUTEROL SULFATE (2.5 MG/3ML) 0.083% IN NEBU: 2.5000 mg | INHALATION_SOLUTION | RESPIRATORY_TRACT | Status: DC | PRN

## 2017-08-02 MED ORDER — HYDROXYCHLOROQUINE SULFATE 200 MG PO TABS
200.0000 mg | ORAL_TABLET | Freq: Two times a day (BID) | ORAL | Status: DC
  Administered 2017-08-02 – 2017-08-13 (×19): 200 mg via ORAL
  Filled 2017-08-02 (×23): qty 1

## 2017-08-02 MED ORDER — ONDANSETRON HCL 4 MG/2ML IJ SOLN: 4.0000 mg | Freq: Four times a day (QID) | INTRAMUSCULAR | Status: DC | PRN

## 2017-08-02 MED ORDER — ALBUTEROL SULFATE HFA 108 (90 BASE) MCG/ACT IN AERS: 1.0000 | INHALATION_SPRAY | RESPIRATORY_TRACT | Status: DC | PRN

## 2017-08-02 MED ORDER — SODIUM CHLORIDE 0.9 % IV BOLUS (SEPSIS)
2000.0000 mL | Freq: Once | INTRAVENOUS | Status: AC
  Administered 2017-08-02: 2000 mL via INTRAVENOUS

## 2017-08-02 MED ORDER — NOREPINEPHRINE BITARTRATE 1 MG/ML IV SOLN
0.0000 ug/min | INTRAVENOUS | Status: DC
  Administered 2017-08-03: 11 ug/min via INTRAVENOUS
  Administered 2017-08-03 (×2): 9 ug/min via INTRAVENOUS
  Administered 2017-08-03 – 2017-08-04 (×2): 12 ug/min via INTRAVENOUS
  Filled 2017-08-02 (×7): qty 4

## 2017-08-02 MED ORDER — ORAL CARE MOUTH RINSE
15.0000 mL | Freq: Two times a day (BID) | OROMUCOSAL | Status: DC
  Administered 2017-08-02 – 2017-08-04 (×4): 15 mL via OROMUCOSAL

## 2017-08-02 MED ORDER — SODIUM CHLORIDE 0.9 % IV SOLN: INTRAVENOUS | Status: DC

## 2017-08-02 MED ORDER — DOCUSATE SODIUM 100 MG PO CAPS
100.0000 mg | ORAL_CAPSULE | Freq: Two times a day (BID) | ORAL | Status: DC
  Administered 2017-08-02 – 2017-08-12 (×15): 100 mg via ORAL
  Filled 2017-08-02 (×18): qty 1

## 2017-08-02 MED ORDER — METHYLPREDNISOLONE SODIUM SUCC 125 MG IJ SOLR: 60.0000 mg | Freq: Three times a day (TID) | INTRAMUSCULAR | Status: DC

## 2017-08-02 MED ORDER — LACTATED RINGERS IV BOLUS (SEPSIS)
2500.0000 mL | Freq: Once | INTRAVENOUS | Status: AC
  Administered 2017-08-02: 2500 mL via INTRAVENOUS
  Filled 2017-08-02: qty 3000

## 2017-08-02 MED ORDER — ONDANSETRON HCL 4 MG PO TABS: 4.0000 mg | ORAL_TABLET | Freq: Four times a day (QID) | ORAL | Status: DC | PRN

## 2017-08-02 MED ORDER — DIVALPROEX SODIUM 125 MG PO CSDR
375.0000 mg | DELAYED_RELEASE_CAPSULE | Freq: Three times a day (TID) | ORAL | Status: DC
  Administered 2017-08-02 – 2017-08-04 (×6): 375 mg via ORAL
  Filled 2017-08-02 (×14): qty 3

## 2017-08-02 MED ORDER — FLUPHENAZINE HCL 5 MG PO TABS
12.5000 mg | ORAL_TABLET | Freq: Every day | ORAL | Status: DC
  Administered 2017-08-03 – 2017-08-04 (×2): 12.5 mg via ORAL
  Filled 2017-08-02 (×5): qty 1

## 2017-08-02 MED ORDER — NOREPINEPHRINE BITARTRATE 1 MG/ML IV SOLN
0.0000 ug/min | Freq: Once | INTRAVENOUS | Status: AC
  Administered 2017-08-02: 6 ug/min via INTRAVENOUS
  Filled 2017-08-02: qty 4

## 2017-08-02 MED ORDER — IPRATROPIUM-ALBUTEROL 0.5-2.5 (3) MG/3ML IN SOLN
3.0000 mL | RESPIRATORY_TRACT | Status: DC
  Administered 2017-08-02 – 2017-08-05 (×17): 3 mL via RESPIRATORY_TRACT
  Filled 2017-08-02 (×18): qty 3

## 2017-08-02 MED ORDER — HYDROCORTISONE NA SUCCINATE PF 100 MG IJ SOLR
100.0000 mg | INTRAMUSCULAR | Status: AC
  Administered 2017-08-02: 100 mg via INTRAVENOUS
  Filled 2017-08-02: qty 2

## 2017-08-02 MED ORDER — VITAMIN C 500 MG PO TABS
500.0000 mg | ORAL_TABLET | Freq: Two times a day (BID) | ORAL | Status: DC
  Administered 2017-08-02 – 2017-08-13 (×19): 500 mg via ORAL
  Filled 2017-08-02 (×23): qty 1

## 2017-08-02 MED ORDER — SENNOSIDES-DOCUSATE SODIUM 8.6-50 MG PO TABS: 1.0000 | ORAL_TABLET | Freq: Every evening | ORAL | Status: DC | PRN

## 2017-08-02 MED ORDER — ACETAMINOPHEN 650 MG RE SUPP: 650.0000 mg | Freq: Four times a day (QID) | RECTAL | Status: DC | PRN

## 2017-08-02 MED ORDER — LEVOFLOXACIN IN D5W 750 MG/150ML IV SOLN
750.0000 mg | Freq: Once | INTRAVENOUS | Status: AC
  Administered 2017-08-02: 750 mg via INTRAVENOUS
  Filled 2017-08-02: qty 150

## 2017-08-02 MED ORDER — VANCOMYCIN HCL IN DEXTROSE 1-5 GM/200ML-% IV SOLN
1000.0000 mg | Freq: Once | INTRAVENOUS | Status: AC
  Administered 2017-08-02: 1000 mg via INTRAVENOUS
  Filled 2017-08-02: qty 200

## 2017-08-02 MED ORDER — LEVOTHYROXINE SODIUM 50 MCG PO TABS
150.0000 ug | ORAL_TABLET | Freq: Every day | ORAL | Status: DC
  Administered 2017-08-03: 150 ug via ORAL
  Filled 2017-08-02 (×2): qty 1

## 2017-08-02 MED ORDER — CEFTRIAXONE SODIUM 1 G IJ SOLR
1.0000 g | INTRAMUSCULAR | Status: DC
  Administered 2017-08-02 – 2017-08-03 (×2): 1 g via INTRAVENOUS
  Filled 2017-08-02 (×4): qty 10

## 2017-08-02 MED ORDER — TRAZODONE HCL 50 MG PO TABS
150.0000 mg | ORAL_TABLET | Freq: Every day | ORAL | Status: DC
  Administered 2017-08-02 – 2017-08-12 (×9): 150 mg via ORAL
  Filled 2017-08-02: qty 1
  Filled 2017-08-02: qty 3
  Filled 2017-08-02: qty 1
  Filled 2017-08-02 (×6): qty 3

## 2017-08-02 MED ORDER — ACETAMINOPHEN 325 MG PO TABS: 650.0000 mg | ORAL_TABLET | Freq: Four times a day (QID) | ORAL | Status: DC | PRN

## 2017-08-02 NOTE — ED Notes (Signed)
Bear hugger placed

## 2017-08-02 NOTE — ED Provider Notes (Signed)
[facility] Emergency Department Provider Note  ____________________________________________  Time seen: Approximately 11:41 AM  I have reviewed the triage vital signs and the nursing notes.   HISTORY  Chief Complaint Bradycardia  Level 5 Caveat: Portions of the History and Physical were unable to be obtained due to altered mental status.   HPI Jerry [name] is a 61 y.o. male sent to the ED from nursing home due to bradycardia and altered mental status. Patient has a history of sepsis. Has had decreased urine output for the past 2 days. Patient is not able to provide any additional history at this time.      Past Medical History:  Diagnosis Date  . BPH (benign prostatic hyperplasia)   . CHF (congestive heart failure) ([place])   . Chronic pain   . COPD (chronic obstructive pulmonary disease) ([place])   . Hypertension   . Morbid obesity ([place])   . Pressure ulcer   . Rheumatoid arthritis ([place])   . Schizophrenia ([place])   . Thyroid disease      Patient Active Problem List   Diagnosis Date Noted  . Thrombocytopenia ([place]) 06/21/2017  . Leukopenia 06/21/2017  . Pressure injury of skin 06/21/2017     History reviewed. No pertinent surgical history.   Prior to Admission medications   Medication Sig Start Date End Date Taking? Authorizing Provider  albuterol (PROVENTIL HFA;VENTOLIN HFA) 108 (90 Base) MCG/ACT inhaler Inhale 1 puff into the lungs every 3 (three) hours as needed for wheezing or shortness of breath.   Yes [provider]  amLODipine (NORVASC) 10 MG tablet Take 10 mg by mouth daily.   Yes [provider]  benztropine (COGENTIN) 0.5 MG tablet Take 0.5 mg by mouth 2 (two) times daily.   Yes [provider]  carvedilol (COREG) 25 MG tablet Take 25 mg by mouth 2 (two) times daily with a meal.   Yes [provider]  clonazePAM (KLONOPIN) 1 MG tablet Take 1 tablet (1 mg total) by mouth at bedtime. 07/02/17  Yes Max Sane, MD  cloNIDine (CATAPRES - DOSED IN MG/24 HR) 0.3 mg/24hr patch Place 0.3 mg onto the skin once a week. Tuesday   Yes [provider]  divalproex (DEPAKOTE SPRINKLE) 125 MG capsule Take 375 mg by mouth 3 (three) times daily.   Yes [provider]  docusate sodium (COLACE) 100 MG capsule Take 100 mg by mouth 2 (two) times daily.   Yes [provider]  fluPHENAZine (PROLIXIN) 2.5 MG tablet Take 5 tablets (12.5 mg total) by mouth daily. Take 12.5 mg by mouth in the morning and 15 mg by mouth at bedtime. 07/02/17  Yes Max Sane, MD  hydroxychloroquine (PLAQUENIL) 200 MG tablet Take 200 mg by mouth 2 (two) times daily.   Yes [provider]  levothyroxine (SYNTHROID, LEVOTHROID) 150 MCG tablet Take 150 mcg by mouth daily before breakfast.   Yes [provider]  methocarbamol (ROBAXIN) 750 MG tablet Take 750 mg by mouth at bedtime.   Yes [provider]  Multiple Vitamins-Minerals (MULTIVITAMIN WITH MINERALS) tablet Take 1 tablet by mouth daily.   Yes [provider]  Polyethylene Glycol 3350 (MIRALAX PO) Take 17 g by mouth daily.   Yes [provider]  potassium chloride (MICRO-K) 10 MEQ CR capsule Take 10 mEq by mouth daily.   Yes [provider]  tamsulosin (FLOMAX) 0.4 MG CAPS capsule Take 0.4 mg by mouth daily.   Yes [provider]  torsemide (  DEMADEX) 10 MG tablet Take 10 mg by mouth daily.   Yes [provider]  traZODone (DESYREL) 150 MG tablet Take 150 mg by mouth at bedtime.   Yes [provider]  vitamin C (ASCORBIC ACID) 250 MG tablet Take 500 mg by mouth 2 (two) times daily.   Yes [provider]     Allergies Methadone; Penicillins; Propoxyphene; and Sulfa antibiotics   No family history on file.  Social History Social History  Substance Use Topics  . Smoking status: Never Smoker  . Smokeless tobacco: Never Used  . Alcohol use No    Review of  Systems Unable to reliably obtained due to altered mental status ____________________________________________   PHYSICAL EXAM:  VITAL SIGNS: ED Triage Vitals  Enc Vitals Group     BP --      Pulse Rate 08/02/17 1059 (!) 46     Resp 08/02/17 1059 12     Temp 08/02/17 1104 97.6 F (36.4 C)     Temp Source 08/02/17 1104 Rectal     SpO2 08/02/17 1101 100 %     Weight --      Height --      Head Circumference --      [facility] --      Pain Score --      Pain Loc --      Pain Edu? --      Excl. in [place]? --     Vital signs reviewed, nursing assessments reviewed.   Constitutional:   Awake alert oriented to self. Ill-appearing. Eyes:   No scleral icterus.  EOMI. No nystagmus. No conjunctival pallor. PERRL at 2 mm bilateral. ENT   Head:   Normocephalic and atraumatic.   Nose:   No congestion/rhinnorhea.    Mouth/Throat:   Dry mucous membranes, no pharyngeal erythema. No peritonsillar mass.    Neck:   No meningismus. Full ROM Hematological/Lymphatic/Immunilogical:   No cervical lymphadenopathy. Cardiovascular:   Bradycardia heart rate 45. Symmetric bilateral radial and DP pulses.  No murmurs.  Respiratory:   Normal respiratory effort without tachypnea/retractions. Diffuse rhonchi. Congested cough.  Gastrointestinal:   Soft with diffuse tenderness. Non distended. There is no CVA tenderness.  No rebound, rigidity, or guarding. Genitourinary:   deferred Musculoskeletal:   Range of motion testing limited by a long-term disability of both lower extremities. Full range of motion in upper extremities, no bony tenderness or apparent wounds. Bilateral lower extremity edema Neurologic:   Limited speech, answers yes and no questions Motor grossly intact bilateral upper extremities. Cranial nerve testing limited by patient's ability to participate in exam No acute focal neurologic deficits are appreciated.  Skin:    Skin is cool, dry and intact. No rash noted.  No petechiae,  purpura, or bullae.  ____________________________________________    LABS (pertinent positives/negatives) (all labs ordered are listed, but only abnormal results are displayed) Labs Reviewed  COMPREHENSIVE METABOLIC PANEL - Abnormal; Notable for the following:       Result Value   Chloride 100 (*)    CO2 36 (*)    BUN 35 (*)    Albumin 3.0 (*)    All other components within normal limits  CBC WITH DIFFERENTIAL/PLATELET - Abnormal; Notable for the following:    WBC 2.2 (*)    RBC 3.87 (*)    Hemoglobin 11.4 (*)    HCT 34.3 (*)    RDW 17.8 (*)    Platelets 32 (*)    Neutro Abs 1.0 (*)  Lymphs Abs 0.9 (*)    All other components within normal limits  APTT - Abnormal; Notable for the following:    aPTT 50 (*)    All other components within normal limits  URINALYSIS, COMPLETE (UACMP) WITH MICROSCOPIC - Abnormal; Notable for the following:    Color, Urine YELLOW (*)    APPearance CLOUDY (*)    Leukocytes, UA LARGE (*)    Bacteria, UA RARE (*)    Squamous Epithelial / LPF 0-5 (*)    All other components within normal limits  CULTURE, BLOOD (ROUTINE X 2)  CULTURE, BLOOD (ROUTINE X 2)  URINE CULTURE  LACTIC ACID, PLASMA  LIPASE, BLOOD  TROPONIN I  PROCALCITONIN  PROTIME-INR   ____________________________________________   EKG  Interpreted by me Atrial fibrillation, slow response with a ventricular rate of 45. Normal axis. Normal QRS ST segments and T waves.  ____________________________________________    RADIOLOGY  Ct Head Wo Contrast  Result Date: 08/02/2017 CLINICAL DATA:  Altered level of consciousness EXAM: CT HEAD WITHOUT CONTRAST TECHNIQUE: Contiguous axial images were obtained from the base of the skull through the vertex without intravenous contrast. COMPARISON:  06/28/2017 FINDINGS: Brain: Moderate atrophy unchanged. Negative for acute infarct. Negative for hemorrhage or mass lesion. Vascular: Negative for hyperdense vessel Skull: Negative Sinuses/Orbits:  Air-fluid level in the sphenoid sinus. Mild mucosal edema in the maxillary sinuses bilaterally. Bilateral exophthalmos. No orbital mass. Other: None IMPRESSION: No acute intracranial abnormality.  Generalized atrophy Sinus mucosal disease Electronically Signed   By: Franchot Gallo M.D.   On: 08/02/2017 11:48   Dg Chest Port 1 View  Result Date: 08/02/2017 CLINICAL DATA:  Bradycardia. EXAM: PORTABLE CHEST 1 VIEW COMPARISON:  Radiograph of June 29, 2017. FINDINGS: Stable cardiomegaly. Atherosclerosis of thoracic aorta is noted. No pneumothorax is noted. Mild central pulmonary vascular congestion is noted. Stable left basilar atelectasis or scarring is noted. Bony thorax is unremarkable. IMPRESSION: Stable cardiomegaly with central pulmonary vascular congestion. Aortic atherosclerosis. Stable left basilar atelectasis or scarring. Electronically Signed   By: Marijo Conception, M.D.   On: 08/02/2017 11:36    ____________________________________________   PROCEDURES Procedures CRITICAL CARE Performed by: Joni Fears, Donna Silverman   Total critical care time: 35 minutes  Critical care time was exclusive of separately billable procedures and treating other patients.  Critical care was necessary to treat or prevent imminent or life-threatening deterioration.  Critical care was time spent personally by me on the following activities: development of treatment plan with patient and/or surrogate as well as nursing, discussions with consultants, evaluation of patient's response to treatment, examination of patient, obtaining history from patient or surrogate, ordering and performing treatments and interventions, ordering and review of laboratory studies, ordering and review of radiographic studies, pulse oximetry and re-evaluation of patient's condition.  ____________________________________________   INITIAL IMPRESSION / ASSESSMENT AND PLAN / ED COURSE  Pertinent labs & imaging results that were available  during my care of the patient were reviewed by me and considered in my medical decision making (see chart for details).    Clinical Course as of Aug 02 1326  Thu Aug 02, 2017  1107 Code sepsis initiated.  Questionable facial droop today, but with hypothermia/hypotension  as cause of neuro dysfunction and LKW yesterday, do not feel that the patient meets code stroke criteria. Will obtain CT head.  [PS]    Clinical Course User Index [PS] Carrie Mew, MD     ----------------------------------------- 1:26 PM on 08/02/2017 -----------------------------------------  Workup reveals urinary tract infection,  otherwise relatively unremarkable set of labs. Does have leukopenia with a white blood cell count of 2. After large volume IV fluid resuscitation, blood pressure remains about 80/50. Will start norepinephrine. Core temp improved to about 89 so far. Case discussed with the hospitalists for admission.  ____________________________________________   FINAL CLINICAL IMPRESSION(S) / ED DIAGNOSES  Final diagnoses:  Cystitis  Sepsis, due to unspecified organism ([place]  Altered mental status, unspecified altered mental status type  Other specified hypotension      New Prescriptions   No medications on file     Portions of this note were generated with dragon dictation software. Dictation errors may occur despite best attempts at proofreading.    Carrie Mew, MD 08/02/17 [phone]

## 2017-08-02 NOTE — H&P (Signed)
[facility] NAME: Jerry [name]    MR#:  [id number]  DATE OF BIRTH:  [birth year]  DATE OF ADMISSION:  08/02/2017  PRIMARY CARE PHYSICIAN: [name], MD   REQUESTING/REFERRING PHYSICIAN: dr Joni Fears CHIEF COMPLAINT:   Bradycardia AMS no UOP for 2 days HISTORY OF PRESENT ILLNESS:  Jerry [name]  is a 61 y.o. male with a known history of bilateral unstageable heel ulcers, schizophrenia and COPD who presents from [facility] due to above issues. Per facility patient is not voided since September 4. He was noted have heart rate in the 40s with blood pressure 113/82.Marland Kitchen When he arrived to emergency room he was found to have hypothermia and low blood pressure. He was given for an half liters of fluid and blood pressure remains systolic in the 09N. He was given broad-spectrum IV antibiotics including aztreonam, Levaquin and vancomycin. Chest x-ray shows some mild pulmonary edema. ER physician will place a central line I spoke with Dr. Alva Garnet about admission. It appears that he has septic shock from urinary tract infection.  No information can be illicited from patient as he is confused and altered.  Patient was hospitalized in July for sepsis requiring ICU and ventilator support. At baseline apparently he is a poor historian but is able to answer questions appropriately.  PAST MEDICAL HISTORY:   Past Medical History:  Diagnosis Date  . BPH (benign prostatic hyperplasia)   . CHF (congestive heart failure) ([place])   . Chronic pain   . COPD (chronic obstructive pulmonary disease) ([place])   . Hypertension   . Morbid obesity ([place])   . Pressure ulcer   . Rheumatoid arthritis ([place])   . Schizophrenia ([place])   . Thyroid disease     PAST SURGICAL HISTORY:  History reviewed. No pertinent surgical history.  SOCIAL HISTORY:   Social History  Substance Use Topics  . Smoking status: Never Smoker  . Smokeless tobacco: Never Used  . Alcohol use  No    FAMILY HISTORY:  No family history on file.  DRUG ALLERGIES:   Allergies  Allergen Reactions  . Methadone   . Penicillins     .Has patient had a PCN reaction causing immediate rash, facial/tongue/throat swelling, SOB or lightheadedness with hypotension: Unknown Has patient had a PCN reaction causing severe rash involving mucus membranes or skin necrosis: Unknown Has patient had a PCN reaction that required hospitalization: Unknown Has patient had a PCN reaction occurring within the last 10 years: Unknown If all of the above answers are "NO", then may proceed with Cephalosporin use.   Marland Kitchen Propoxyphene   . Sulfa Antibiotics     REVIEW OF SYSTEMS:   Review of Systems  Unable to perform ROS: Acuity of condition    MEDICATIONS AT HOME:   Prior to Admission medications   Medication Sig Start Date End Date Taking? Authorizing Provider  albuterol (PROVENTIL HFA;VENTOLIN HFA) 108 (90 Base) MCG/ACT inhaler Inhale 1 puff into the lungs every 3 (three) hours as needed for wheezing or shortness of breath.   Yes [provider]  amLODipine (NORVASC) 10 MG tablet Take 10 mg by mouth daily.   Yes [provider]  benztropine (COGENTIN) 0.5 MG tablet Take 0.5 mg by mouth 2 (two) times daily.   Yes [provider]  carvedilol (COREG) 25 MG tablet Take 25 mg by mouth 2 (two) times daily with a meal.   Yes [provider]  clonazePAM (KLONOPIN) 1 MG tablet  Take 1 tablet (1 mg total) by mouth at bedtime. 07/02/17  Yes Max Sane, MD  cloNIDine (CATAPRES - DOSED IN MG/24 HR) 0.3 mg/24hr patch Place 0.3 mg onto the skin once a week. Tuesday   Yes [provider]  divalproex (DEPAKOTE SPRINKLE) 125 MG capsule Take 375 mg by mouth 3 (three) times daily.   Yes [provider]  docusate sodium (COLACE) 100 MG capsule Take 100 mg by mouth 2 (two) times daily.   Yes [provider]  fluPHENAZine (PROLIXIN) 2.5 MG tablet Take 5 tablets (12.5  mg total) by mouth daily. Take 12.5 mg by mouth in the morning and 15 mg by mouth at bedtime. 07/02/17  Yes Max Sane, MD  hydroxychloroquine (PLAQUENIL) 200 MG tablet Take 200 mg by mouth 2 (two) times daily.   Yes [provider]  levothyroxine (SYNTHROID, LEVOTHROID) 150 MCG tablet Take 150 mcg by mouth daily before breakfast.   Yes [provider]  methocarbamol (ROBAXIN) 750 MG tablet Take 750 mg by mouth at bedtime.   Yes [provider]  Multiple Vitamins-Minerals (MULTIVITAMIN WITH MINERALS) tablet Take 1 tablet by mouth daily.   Yes [provider]  Polyethylene Glycol 3350 (MIRALAX PO) Take 17 g by mouth daily.   Yes [provider]  potassium chloride (MICRO-K) 10 MEQ CR capsule Take 10 mEq by mouth daily.   Yes [provider]  tamsulosin (FLOMAX) 0.4 MG CAPS capsule Take 0.4 mg by mouth daily.   Yes [provider]  torsemide (DEMADEX) 10 MG tablet Take 10 mg by mouth daily.   Yes [provider]  traZODone (DESYREL) 150 MG tablet Take 150 mg by mouth at bedtime.   Yes [provider]  vitamin C (ASCORBIC ACID) 250 MG tablet Take 500 mg by mouth 2 (two) times daily.   Yes [provider]      VITAL SIGNS:  Blood pressure (!) 77/53, pulse (!) 56, temperature (!) 88.8 F (31.6 C), resp. rate 19, SpO2 100 %.  PHYSICAL EXAMINATION:   Physical Exam  Constitutional: No distress.  Morbidly obese  HENT:  Head: Normocephalic.  Eyes: No scleral icterus.  Neck: Normal range of motion. Neck supple. No JVD present. No tracheal deviation present.  Cardiovascular: Normal rate, regular rhythm and normal heart sounds.  Exam reveals no gallop and no friction rub.   No murmur heard. Pulmonary/Chest: Effort normal. No stridor. No respiratory distress. He has no wheezes. He has rales. He exhibits no tenderness.  Bilateral rhonchi  Abdominal: Soft. Bowel sounds are normal. He exhibits no distension and no  mass. There is no tenderness. There is no rebound and no guarding.  Musculoskeletal: Normal range of motion. He exhibits edema.  Neurological: He is alert.  Does not answer any questions  Skin: Skin is warm. No rash noted. No erythema.  He has beer hugger on Bilateral heel support As per ED physician no skin breakdown noted      LABORATORY PANEL:   CBC  Recent Labs Lab 08/02/17 1102  WBC 2.2*  HGB 11.4*  HCT 34.3*  PLT 32*   ------------------------------------------------------------------------------------------------------------------  Chemistries   Recent Labs Lab 08/02/17 1102  NA 143  K 4.4  CL 100*  CO2 36*  GLUCOSE 75  BUN 35*  CREATININE 0.65  CALCIUM 9.3  AST 38  ALT 35  ALKPHOS 92  BILITOT 0.3   ------------------------------------------------------------------------------------------------------------------  Cardiac Enzymes  Recent Labs Lab 08/02/17 1102  TROPONINI <0.03   ------------------------------------------------------------------------------------------------------------------  RADIOLOGY:  Ct Head Wo Contrast  Result Date: 08/02/2017 CLINICAL DATA:  Altered level of consciousness EXAM: CT HEAD WITHOUT CONTRAST TECHNIQUE: Contiguous axial images were obtained from the base of the skull through the vertex without intravenous contrast. COMPARISON:  06/28/2017 FINDINGS: Brain: Moderate atrophy unchanged. Negative for acute infarct. Negative for hemorrhage or mass lesion. Vascular: Negative for hyperdense vessel Skull: Negative Sinuses/Orbits: Air-fluid level in the sphenoid sinus. Mild mucosal edema in the maxillary sinuses bilaterally. Bilateral exophthalmos. No orbital mass. Other: None IMPRESSION: No acute intracranial abnormality.  Generalized atrophy Sinus mucosal disease Electronically Signed   By: Franchot Gallo M.D.   On: 08/02/2017 11:48   Dg Chest Port 1 View  Result Date: 08/02/2017 CLINICAL DATA:  Bradycardia. EXAM: PORTABLE  CHEST 1 VIEW COMPARISON:  Radiograph of June 29, 2017. FINDINGS: Stable cardiomegaly. Atherosclerosis of thoracic aorta is noted. No pneumothorax is noted. Mild central pulmonary vascular congestion is noted. Stable left basilar atelectasis or scarring is noted. Bony thorax is unremarkable. IMPRESSION: Stable cardiomegaly with central pulmonary vascular congestion. Aortic atherosclerosis. Stable left basilar atelectasis or scarring. Electronically Signed   By: Marijo Conception, M.D.   On: 08/02/2017 11:36    EKG:  HR 43 ??AA flutter   Will need to repeat EKG  IMPRESSION AND PLAN:   61 year old male from [facility] with history of COPD andhypertension who presents with altered mental status, bradycardia and septic shock.  1. Septic shock: Patient presents with low blood pressure, leukopenia, bradycardia and hypothermia due to urinary tract infection.  Patient will require pressors which have been started in the ER. I have spoken with intensivist. Continue broad-spectrum antibiotics with aztreonam, Levaquin and VANC Follow-up on final blood and urine culture Continue IV fluids  2. Nonstageable heel ulcers: Wound care consultation  3.Acute metabolic encephalopathy in setting of septic shock Initial CT head negative Follow neuro status  4. Acute hypoxic respiratory failure: Considering IV Lasix once blood pressure improves Start steroids and nebs   5. Hypothermia from septic shock  6. Schizophrenia: Restart home medications the patient able to take them  7. Hypothyroid: Continue Synthroid    All the records are reviewed and case discussed with ED provider.   CODE STATUS: FULL  CRITICAL CARE  TOTAL TIME TAKING CARE OF THIS PATIENT: 60 minutes.    Sopheap Boehle M.D on 08/02/2017 at 1:37 PM  Between 7am to 6pm - Pager - [phone]  After 6pm go to [email or web address] - password [email or web address] [facility]  Office  [phone]  CC: Primary care physician;  [name], MD

## 2017-08-02 NOTE — ED Triage Notes (Signed)
Pt to ED via EMS from [facility] with c/o bradycardia in 40s, cbg 105, 113/82. Per facility pt hx of sepsis, states pt has not voided since 9/4. EDP at bedside

## 2017-08-02 NOTE — ED Notes (Signed)
Collected red, blue, light grn, lav top and one set of blood culture from R forearm. Second set of blood cultures came from [facility]. All sent to lab.

## 2017-08-02 NOTE — Consult Note (Signed)
Name: Jerry [name] MRN: [id number] DOB: 09-08-1956    ADMISSION DATE:  08/02/2017 CONSULTATION DATE: 08/02/2017  REFERRING MD : Dr. Benjie Karvonen    CHIEF COMPLAINT: Altered Mental Status   BRIEF PATIENT DESCRIPTION:  61 yo male admitted 09/6 with septic shock, hypotension, and hypothermia secondary to UTI requiring vasopressor therapy, acute encephalopath, and bradycardia    SIGNIFICANT EVENTS  09/5-Pt admitted to ICU   STUDIES:  CT Head 09/6>>No acute intracranial abnormality.  Generalized atrophy Sinus mucosal disease  HISTORY OF PRESENT ILLNESS:   This is a 61 year old male with a past medical history of hypothyroidism, rheumatoid arthritis, pressure ulcer, morbid obesity, hypertension, COPD, chronic pain, CHF, and BPH.  He presented to [facility] ER 09/6 from [facility] with bradycardia and altered mental status.  In the ER the pt was hypotensive and hypothermic ruling pt in for septic shock, therefore  he received 4.5L of IV fluids, however despite fluid resuscitation he remained hypotensive and levophed drip initiated.  He was subsequently admitted to ICU by hospitalist team for further workup and treatment PCCM consulted.    PAST MEDICAL HISTORY :   has a past medical history of BPH (benign prostatic hyperplasia); CHF (congestive heart failure) ([place]); Chronic pain; COPD (chronic obstructive pulmonary disease) ([place]); Hypertension; Morbid obesity ([place]); Pressure ulcer; Rheumatoid arthritis ([place]); Schizophrenia ([place]); and Thyroid disease.  has no past surgical history on file. Prior to Admission medications   Medication Sig Start Date End Date Taking? Authorizing Provider  albuterol (PROVENTIL HFA;VENTOLIN HFA) 108 (90 Base) MCG/ACT inhaler Inhale 1 puff into the lungs every 3 (three) hours as needed for wheezing or shortness of breath.   Yes [provider]  amLODipine (NORVASC) 10 MG tablet Take 10 mg by mouth daily.   Yes [provider]  benztropine (COGENTIN) 0.5 MG  tablet Take 0.5 mg by mouth 2 (two) times daily.   Yes [provider]  carvedilol (COREG) 25 MG tablet Take 25 mg by mouth 2 (two) times daily with a meal.   Yes [provider]  clonazePAM (KLONOPIN) 1 MG tablet Take 1 tablet (1 mg total) by mouth at bedtime. 07/02/17  Yes Max Sane, MD  cloNIDine (CATAPRES - DOSED IN MG/24 HR) 0.3 mg/24hr patch Place 0.3 mg onto the skin once a week. Tuesday   Yes [provider]  divalproex (DEPAKOTE SPRINKLE) 125 MG capsule Take 375 mg by mouth 3 (three) times daily.   Yes [provider]  docusate sodium (COLACE) 100 MG capsule Take 100 mg by mouth 2 (two) times daily.   Yes [provider]  fluPHENAZine (PROLIXIN) 2.5 MG tablet Take 5 tablets (12.5 mg total) by mouth daily. Take 12.5 mg by mouth in the morning and 15 mg by mouth at bedtime. 07/02/17  Yes Max Sane, MD  hydroxychloroquine (PLAQUENIL) 200 MG tablet Take 200 mg by mouth 2 (two) times daily.   Yes [provider]  levothyroxine (SYNTHROID, LEVOTHROID) 150 MCG tablet Take 150 mcg by mouth daily before breakfast.   Yes [provider]  methocarbamol (ROBAXIN) 750 MG tablet Take 750 mg by mouth at bedtime.   Yes [provider]  Multiple Vitamins-Minerals (MULTIVITAMIN WITH MINERALS) tablet Take 1 tablet by mouth daily.   Yes [provider]  Polyethylene Glycol 3350 (MIRALAX PO) Take 17 g by mouth daily.   Yes [provider]  potassium chloride (MICRO-K) 10 MEQ CR capsule Take 10 mEq by mouth daily.   Yes [provider]  tamsulosin (FLOMAX) 0.4 MG CAPS capsule Take 0.4 mg by mouth daily.   Yes [provider]  torsemide (DEMADEX) 10 MG tablet Take 10 mg by mouth daily.   Yes [provider]  traZODone (DESYREL) 150 MG tablet Take 150 mg by mouth at bedtime.   Yes [provider]  vitamin C (ASCORBIC ACID) 250 MG tablet Take 500 mg by mouth 2 (two) times daily.   Yes  [provider]   Allergies  Allergen Reactions  . Methadone   . Penicillins     .Has patient had a PCN reaction causing immediate rash, facial/tongue/throat swelling, SOB or lightheadedness with hypotension: Unknown Has patient had a PCN reaction causing severe rash involving mucus membranes or skin necrosis: Unknown Has patient had a PCN reaction that required hospitalization: Unknown Has patient had a PCN reaction occurring within the last 10 years: Unknown If all of the above answers are "NO", then may proceed with Cephalosporin use.   Marland Kitchen Propoxyphene   . Sulfa Antibiotics     FAMILY HISTORY:  family history is not on file. SOCIAL HISTORY:  reports that he has never smoked. He has never used smokeless tobacco. He reports that he does not drink alcohol or use drugs.  REVIEW OF SYSTEMS:  Unable to assess pt confused   SUBJECTIVE:  Unable to assess pt confused   VITAL SIGNS: Temp:  [87.7 F (30.9 C)-97.6 F (36.4 C)] 88.8 F (31.6 C) (09/06 1256) Pulse Rate:  [46-56] 56 (09/06 1256) Resp:  [10-19] 19 (09/06 1256) BP: (77-107)/(53-80) 77/53 (09/06 1247) SpO2:  [96 %-100 %] 100 % (09/06 1256)  PHYSICAL EXAMINATION: General: acutely ill appearing AA male, NAD Neuro: confused, follows very simple commands and tracks, PERRL HEENT: supple, no JVD  Cardiovascular: sinus brady, no M/R/G Lungs: crackles and rhonchi throughout, even, non labored; no wheezes or rales  Abdomen: +BS x4, obese soft, non tender, non distended   Musculoskeletal: moves upper extremities, 1+ generalized edema  Skin: intact no rashes or lesions    Recent Labs Lab 08/02/17 1102  NA 143  K 4.4  CL 100*  CO2 36*  BUN 35*  CREATININE 0.65  GLUCOSE 75    Recent Labs Lab 08/02/17 1102  HGB 11.4*  HCT 34.3*  WBC 2.2*  PLT 32*   Ct Head Wo Contrast  Result Date: 08/02/2017 CLINICAL DATA:  Altered level of consciousness EXAM: CT HEAD WITHOUT CONTRAST TECHNIQUE: Contiguous axial  images were obtained from the base of the skull through the vertex without intravenous contrast. COMPARISON:  06/28/2017 FINDINGS: Brain: Moderate atrophy unchanged. Negative for acute infarct. Negative for hemorrhage or mass lesion. Vascular: Negative for hyperdense vessel Skull: Negative Sinuses/Orbits: Air-fluid level in the sphenoid sinus. Mild mucosal edema in the maxillary sinuses bilaterally. Bilateral exophthalmos. No orbital mass. Other: None IMPRESSION: No acute intracranial abnormality.  Generalized atrophy Sinus mucosal disease Electronically Signed   By: Franchot Gallo M.D.   On: 08/02/2017 11:48   Dg Chest Port 1 View  Result Date: 08/02/2017 CLINICAL DATA:  Bradycardia. EXAM: PORTABLE CHEST 1 VIEW COMPARISON:  Radiograph of June 29, 2017. FINDINGS: Stable cardiomegaly. Atherosclerosis of thoracic aorta is noted. No pneumothorax is noted. Mild central pulmonary vascular congestion is noted. Stable left basilar atelectasis or scarring is noted. Bony thorax is unremarkable. IMPRESSION: Stable cardiomegaly with central pulmonary vascular congestion. Aortic atherosclerosis. Stable left basilar atelectasis or scarring. Electronically Signed   By: Marijo Conception, M.D.   On: 08/02/2017 11:36  ASSESSMENT / PLAN: Septic shock with hypotension secondary to UTI  Acute encephalopathy likely secondary to sepsis  Bradycardia likely secondary to beta blocker  Hypothermia  Thrombocytopenia  Hx: COPD, RA, CHF, and Morbid Obesity P: Supplemental O2 to maintain O2 sats 88% to 92% and/or for dyspnea  Prn CXR Scheduled and prn bronchodilator therapy  Prn levophed gtt to maintain map >65 Hold outpatient hypertensive medications for now  Continuous telemetry monitoring  Trend WBC and monitor fever curve Trend PCT and lactic acid Follow cultures Continue abx  SCD's for VTE prophylaxis, avoid chemical prophylaxis  Stat cortisol level Solu-cortef 100 mg x1 dose  Prn warming blanket    Marda Stalker, [place] Pager [phone] (please enter 7 digits) PCCM Consult Pager [phone] (please enter 7 digits)   PCCM ATTENDING ATTESTATION:  I have evaluated patient with the APP [place], reviewed database in its entirety and discussed care plan in detail. In addition, this patient was discussed on multidisciplinary rounds. He is well known to me and our service due to his recent hospitalization. He is unable to provide much history due to his baseline cognitive impairment. He is now admitted from NH with shock, presumed septic in origin, with pyuria.   Important exam findings: NAD on [place] O2 Answers questions - largely unintelligibly  Few rhonchi, no wheezes Reg, no M Obese, soft, NT, +BS Ext warm, no LE edema Changes of severe RA in B hands No focal neuro deficits   BMP Latest Ref Rng & Units 08/02/2017 06/30/2017 06/29/2017  Glucose 65 - 99 mg/dL 75 79 97  BUN 6 - 20 mg/dL 35(H) 8 8  Creatinine 0.61 - 1.24 mg/dL 0.65 0.54(L) 0.53(L)  Sodium 135 - 145 mmol/L 143 140 142  Potassium 3.5 - 5.1 mmol/L 4.4 3.4(L) 3.7  Chloride 101 - 111 mmol/L 100(L) 106 104  CO2 22 - 32 mmol/L 36(H) 27 32  Calcium 8.9 - 10.3 mg/dL 9.3 8.2(L) 8.4(L)    CBC Latest Ref Rng & Units 08/02/2017 06/30/2017 06/29/2017  WBC 3.8 - 10.6 K/uL 2.2(L) 6.3 7.5  Hemoglobin 13.0 - 18.0 g/dL 11.4(L) 9.2(L) 9.7(L)  Hematocrit 40.0 - 52.0 % 34.3(L) 27.0(L) 28.5(L)  Platelets 150 - 440 K/uL 32(L) 142(L) 101(L)     CXR: mild IS prominence with L basilar scarring vs small effusion - L sided infiltrate improved from prior film  Major problems addressed by PCCM team: Severe sepsis/septic shock Urinary tract infection/pyuria Metabolic alkalosis - likely due to loop diuresis (torsemide) Leukopenia Acute on chronic thrombocytopenia Previously low cortisol - possible adrenal insufficiency  PLAN/REC: Norepi to maintain MAP > 65 mmHg Recheck cortisol level Hydrocortisone X 100 pending results of  cortisol level Hold all antihypertensives, diuretics Monitor temp, WBC count Micro and abx as above DVT px: SCDs Monitor CBC intermittently Transfuse per usual guidelines   Merton Border, MD PCCM service Mobile [phone] Pager [phone] 08/02/2017 3:23 PM

## 2017-08-02 NOTE — Progress Notes (Signed)
Pharmacy Antibiotic Note  Jerry [name] is a 61 y.o. male admitted on 08/02/2017 with sepsis/ UTI.  Pharmacy has been consulted for ceftriaxone dosing. Patient received vancomycin, aztreonam, and levofloxacin in the ED. Patient received ceftazidine on previous admission.   Plan: Initiate ceftriaxone 1g IV q24      Temp (24hrs), Avg:89.4 F (31.9 C), Min:87.7 F (30.9 C), Max:97.6 F (36.4 C)   Recent Labs Lab 08/02/17 1102  WBC 2.2*  CREATININE 0.65  LATICACIDVEN 1.0    CrCl cannot be calculated (Unknown ideal weight.).    Allergies  Allergen Reactions  . Methadone   . Penicillins     .Has patient had a PCN reaction causing immediate rash, facial/tongue/throat swelling, SOB or lightheadedness with hypotension: Unknown Has patient had a PCN reaction causing severe rash involving mucus membranes or skin necrosis: Unknown Has patient had a PCN reaction that required hospitalization: Unknown Has patient had a PCN reaction occurring within the last 10 years: Unknown If all of the above answers are "NO", then may proceed with Cephalosporin use.   Marland Kitchen Propoxyphene   . Sulfa Antibiotics     Antimicrobials this admission: 9/6 Aztreonam >> 9/6 9/6 Levofloxacin >> 9/6 9/6 Vancomycin >> 9/6 9/6 Ceftriaxone >>  Dose adjustments this admission: N/A  Microbiology results: 9/6 BCx: sent 9/6 UCx: sent 9/6 MRSA PCR: sent  Thank you for allowing pharmacy to be a part of this patient's care.  Durwin Reges, PharmD Student 08/02/2017 3:05 PM

## 2017-08-02 NOTE — ED Notes (Signed)
Hooked patient up to monitor. 

## 2017-08-02 NOTE — Progress Notes (Signed)
Hypoglycemic Event  CBG: 66  Treatment: 15 GM carbohydrate snack, apple juice 4 oz  Symptoms: None  Follow-up CBG: Time:1648 CBG Result: 2 Gave another 4 oz of juice, let patient eat part of hamburger and chips from meal tray, check again at 1705 and CBG 70  Possible Reasons for Event: Inadequate meal intake  Comments/MD notified: Notified Marda Stalker NP who acknowledged, encouraged PO intake, and ordered CBG checks every hour until 4 consecutive checks were above 90 after which ordered checks to switch to every 4 hours.    Jerry [name], [place]

## 2017-08-03 ENCOUNTER — Inpatient Hospital Stay: Payer: Medicaid Other

## 2017-08-03 DIAGNOSIS — R6521 Severe sepsis with septic shock: Secondary | ICD-10-CM

## 2017-08-03 DIAGNOSIS — A419 Sepsis, unspecified organism: Secondary | ICD-10-CM

## 2017-08-03 DIAGNOSIS — R4182 Altered mental status, unspecified: Secondary | ICD-10-CM

## 2017-08-03 LAB — BASIC METABOLIC PANEL
ANION GAP: 4 — AB (ref 5–15)
BUN: 21 mg/dL — ABNORMAL HIGH (ref 6–20)
CHLORIDE: 104 mmol/L (ref 101–111)
CO2: 33 mmol/L — ABNORMAL HIGH (ref 22–32)
Calcium: 8.4 mg/dL — ABNORMAL LOW (ref 8.9–10.3)
Creatinine, Ser: 0.56 mg/dL — ABNORMAL LOW (ref 0.61–1.24)
GFR calc non Af Amer: >60 mL/min (ref >60)
Glucose, Bld: 199 mg/dL — ABNORMAL HIGH (ref 65–99)
POTASSIUM: 4.2 mmol/L (ref 3.5–5.1)
SODIUM: 141 mmol/L (ref 135–145)

## 2017-08-03 LAB — CBC
HCT: 29.3 % — ABNORMAL LOW (ref 40.0–52.0)
HEMOGLOBIN: 9.7 g/dL — AB (ref 13.0–18.0)
MCH: 30.3 pg (ref 26.0–34.0)
MCHC: 33.2 g/dL (ref 32.0–36.0)
MCV: 91.1 fL (ref 80.0–100.0)
PLATELETS: 32 10*3/uL — AB (ref 150–440)
RBC: 3.22 MIL/uL — AB (ref 4.40–5.90)
RDW: 18 % — AB (ref 11.5–14.5)
WBC: 6.4 10*3/uL (ref 3.8–10.6)

## 2017-08-03 LAB — GLUCOSE, CAPILLARY
GLUCOSE-CAPILLARY: 100 mg/dL — AB (ref 65–99)
GLUCOSE-CAPILLARY: 100 mg/dL — AB (ref 65–99)
GLUCOSE-CAPILLARY: 170 mg/dL — AB (ref 65–99)
GLUCOSE-CAPILLARY: 61 mg/dL — AB (ref 65–99)
GLUCOSE-CAPILLARY: 63 mg/dL — AB (ref 65–99)
GLUCOSE-CAPILLARY: 69 mg/dL (ref 65–99)
GLUCOSE-CAPILLARY: 75 mg/dL (ref 65–99)
GLUCOSE-CAPILLARY: 76 mg/dL (ref 65–99)
GLUCOSE-CAPILLARY: 78 mg/dL (ref 65–99)
GLUCOSE-CAPILLARY: 79 mg/dL (ref 65–99)
GLUCOSE-CAPILLARY: 81 mg/dL (ref 65–99)
GLUCOSE-CAPILLARY: 82 mg/dL (ref 65–99)
GLUCOSE-CAPILLARY: 89 mg/dL (ref 65–99)
GLUCOSE-CAPILLARY: 96 mg/dL (ref 65–99)
Glucose-Capillary: 69 mg/dL (ref 65–99)
Glucose-Capillary: 70 mg/dL (ref 65–99)
Glucose-Capillary: 72 mg/dL (ref 65–99)
Glucose-Capillary: 74 mg/dL (ref 65–99)
Glucose-Capillary: 77 mg/dL (ref 65–99)
Glucose-Capillary: 79 mg/dL (ref 65–99)
Glucose-Capillary: 81 mg/dL (ref 65–99)
Glucose-Capillary: 84 mg/dL (ref 65–99)
Glucose-Capillary: 84 mg/dL (ref 65–99)
Glucose-Capillary: 96 mg/dL (ref 65–99)
Glucose-Capillary: 97 mg/dL (ref 65–99)

## 2017-08-03 MED ORDER — FENTANYL CITRATE (PF) 100 MCG/2ML IJ SOLN
INTRAMUSCULAR | Status: AC
  Administered 2017-08-03: 50 ug via INTRAVENOUS
  Filled 2017-08-03: qty 2

## 2017-08-03 MED ORDER — SODIUM CHLORIDE 0.9 % IV BOLUS (SEPSIS)
500.0000 mL | Freq: Once | INTRAVENOUS | Status: AC
  Administered 2017-08-03: 500 mL via INTRAVENOUS

## 2017-08-03 MED ORDER — OXYCODONE-ACETAMINOPHEN 5-325 MG PO TABS
1.0000 | ORAL_TABLET | Freq: Four times a day (QID) | ORAL | Status: DC | PRN
  Administered 2017-08-03: 1 via ORAL
  Filled 2017-08-03: qty 1

## 2017-08-03 MED ORDER — FENTANYL CITRATE (PF) 100 MCG/2ML IJ SOLN
50.0000 ug | Freq: Once | INTRAMUSCULAR | Status: AC
Start: 2017-08-03 — End: 2017-08-03
  Administered 2017-08-03: 50 ug via INTRAVENOUS

## 2017-08-03 MED ORDER — DEXTROSE 50 % IV SOLN
INTRAVENOUS | Status: AC
  Administered 2017-08-03: 50 mL
  Filled 2017-08-03: qty 50

## 2017-08-03 MED ORDER — PREMIER PROTEIN SHAKE
11.0000 [oz_av] | Freq: Three times a day (TID) | ORAL | Status: DC
  Administered 2017-08-03 – 2017-08-13 (×20): 11 [oz_av] via ORAL

## 2017-08-03 MED ORDER — DEXTROSE 50 % IV SOLN
25.0000 mL | Freq: Once | INTRAVENOUS | Status: AC
  Administered 2017-08-03: 25 mL via INTRAVENOUS
  Filled 2017-08-03: qty 50

## 2017-08-03 MED ORDER — IPRATROPIUM-ALBUTEROL 0.5-2.5 (3) MG/3ML IN SOLN
RESPIRATORY_TRACT | Status: AC
  Filled 2017-08-03: qty 3

## 2017-08-03 NOTE — Progress Notes (Addendum)
[place] [facility] [place] NAME: Jerry [name]    MR#:  [id number]  DATE OF BIRTH:  [birth year]  SUBJECTIVE:60 yr old male admitted for hypothermia/bradycardia/sepis with septic shock.on levaphed.now.minimal responsive,  CHIEF COMPLAINT:   Chief Complaint  Patient presents with  . Bradycardia    REVIEW OF SYSTEMS:   Review of Systems  Unable to perform ROS: Mental status change     DRUG ALLERGIES:   Allergies  Allergen Reactions  . Methadone   . Penicillins     .Has patient had a PCN reaction causing immediate rash, facial/tongue/throat swelling, SOB or lightheadedness with hypotension: Unknown Has patient had a PCN reaction causing severe rash involving mucus membranes or skin necrosis: Unknown Has patient had a PCN reaction that required hospitalization: Unknown Has patient had a PCN reaction occurring within the last 10 years: Unknown If all of the above answers are "NO", then may proceed with Cephalosporin use.   Marland Kitchen Propoxyphene   . Sulfa Antibiotics     VITALS:  Blood pressure 95/60, pulse 76, temperature (!) 96.6 F (35.9 C), resp. rate 12, height 6' (1.829 m), weight (!) 138.9 kg (306 lb 3.5 oz), SpO2 100 %.  PHYSICAL EXAMINATION:  GENERAL:  61 y.o.-year-old patient lying in the bed with no acute distress. Confused/ EYES: Pupils equal, round, reactive to light.. No scleral icterus. Extraocular muscles intact.  HEENT: Head atraumatic, normocephalic. Oropharynx and nasopharynx clear.  NECK:  Supple, no jugular venous distention. No thyroid enlargement, no tenderness.  LUNGS: Normal breath sounds bilaterally, no wheezing, rales,rhonchi or crepitation. No use of accessory muscles of respiration.  CARDIOVASCULAR: S1, S2 normal. No murmurs, rubs, or gallops.  ABDOMEN: Soft, nontender, nondistended. Bowel sounds present. No organomegaly or mass.  EXTREMITIES: No pedal edema, cyanosis, or clubbing.  NEUROLOGIC: confused, unable  to obtain full neurological exam.  PSYCHIATRIC: confused. SKIN: No obvious rash, lesion, or ulcer.    LABORATORY PANEL:   CBC  Recent Labs Lab 08/03/17 0501  WBC 6.4  HGB 9.7*  HCT 29.3*  PLT 32*   ------------------------------------------------------------------------------------------------------------------  Chemistries   Recent Labs Lab 08/02/17 1102 08/03/17 0501  NA 143 141  K 4.4 4.2  CL 100* 104  CO2 36* 33*  GLUCOSE 75 199*  BUN 35* 21*  CREATININE 0.65 0.56*  CALCIUM 9.3 8.4*  AST 38  --   ALT 35  --   ALKPHOS 92  --   BILITOT 0.3  --    ------------------------------------------------------------------------------------------------------------------  Cardiac Enzymes  Recent Labs Lab 08/02/17 1102  TROPONINI <0.03   ------------------------------------------------------------------------------------------------------------------  RADIOLOGY:  Ct Head Wo Contrast  Result Date: 08/02/2017 CLINICAL DATA:  Altered level of consciousness EXAM: CT HEAD WITHOUT CONTRAST TECHNIQUE: Contiguous axial images were obtained from the base of the skull through the vertex without intravenous contrast. COMPARISON:  06/28/2017 FINDINGS: Brain: Moderate atrophy unchanged. Negative for acute infarct. Negative for hemorrhage or mass lesion. Vascular: Negative for hyperdense vessel Skull: Negative Sinuses/Orbits: Air-fluid level in the sphenoid sinus. Mild mucosal edema in the maxillary sinuses bilaterally. Bilateral exophthalmos. No orbital mass. Other: None IMPRESSION: No acute intracranial abnormality.  Generalized atrophy Sinus mucosal disease Electronically Signed   By: Franchot Gallo M.D.   On: 08/02/2017 11:48   Dg Chest Port 1 View  Result Date: 08/03/2017 CLINICAL DATA:  Central line placement. EXAM: PORTABLE CHEST 1 VIEW COMPARISON:  Chest radiograph yesterday FINDINGS: Tip of the right central line in the distal SVC. No evidence of large  pneumothorax, low lung  volumes limit assessment. Low lung volumes persist, similar to prior exam. Stable cardiomegaly and pulmonary vascular congestion. Again seen left lung base scarring. Mild right infrahilar atelectasis. Otherwise no new abnormality. IMPRESSION: Tip of the right central line in the distal SVC. No large pneumothorax. Stable cardiomegaly, vascular congestion and left basilar atelectasis/scarring. Mild right infrahilar atelectasis. Electronically Signed   By: Jeb Levering M.D.   On: 08/03/2017 03:21   Dg Chest Port 1 View  Result Date: 08/02/2017 CLINICAL DATA:  Bradycardia. EXAM: PORTABLE CHEST 1 VIEW COMPARISON:  Radiograph of June 29, 2017. FINDINGS: Stable cardiomegaly. Atherosclerosis of thoracic aorta is noted. No pneumothorax is noted. Mild central pulmonary vascular congestion is noted. Stable left basilar atelectasis or scarring is noted. Bony thorax is unremarkable. IMPRESSION: Stable cardiomegaly with central pulmonary vascular congestion. Aortic atherosclerosis. Stable left basilar atelectasis or scarring. Electronically Signed   By: Marijo Conception, M.D.   On: 08/02/2017 11:36    EKG:   Orders placed or performed during the hospital encounter of 08/02/17  . EKG 12-Lead  . EKG 12-Lead  . EKG 12-Lead  . EKG 12-Lead    ASSESSMENT AND PLAN:   Septic shock with hypotension secondary to UTI c'ontinue Rocephin, pressors with Levophed, follow urine cultures.urine cultures are showing Escherichia coli, Enterococcus faecalis. Final sensitivity results are pending.urine output. #2 bradycardia: Improved. #3 hypothermia: Improved. #4.rheumatoid arthritis  Schizophrenia #6 .prognosis poor.Discussed with sister/  All the records are reviewed and case discussed with Care Management/Social Workerr. Management plans discussed with the patient, family and they are in agreement.  CODE STATUS: full  TOTAL TIME TAKING CARE OF THIS  Patient;35 min  POSSIBLE D/C IN 1-2 DAYS, DEPENDING ON CLINICAL  CONDITION.   Epifanio Lesches M.D on 08/03/2017 at 2:19 PM  Between 7am to 6pm - Pager - [phone]  After 6pm go to [email or web address] - password [email or web address]  Office  [phone]  CC: Primary care physician; [facility], [facility]   Note: This dictation was prepared with Dragon dictation along with smaller phrase technology. Any transcriptional errors that result from this process are unintentional.

## 2017-08-03 NOTE — NC FL2 (Signed)
[place] LEVEL OF CARE SCREENING TOOL     IDENTIFICATION  Patient Name: Jerry [name] Birthdate: 1956/09/22 Sex: male Admission Date (Current Location): 08/02/2017  [place] and [place] Number:  Engineering geologist and Address:  [facility], [address] [place], [place] [address]      Provider Number: [id number]  Attending Physician Name and Address:  Epifanio Lesches, MD  Relative Name and Phone Number:       Current Level of Care: Hospital Recommended Level of Care: [place] Prior Approval Number:    Date Approved/Denied:   PASRR Number: [id number]  Discharge Plan: SNF    Current Diagnoses: Patient Active Problem List   Diagnosis Date Noted  . Altered mental status   . Septic shock ([place]) 08/02/2017  . Thrombocytopenia ([place]) 06/21/2017  . Leukopenia 06/21/2017  . Pressure injury of skin 06/21/2017    Orientation RESPIRATION BLADDER Height & Weight      (not currently)  Normal, O2 (2 liters) Indwelling catheter Weight: (!) 306 lb 3.5 oz (138.9 kg) Height:  6' (182.9 cm)  BEHAVIORAL SYMPTOMS/MOOD NEUROLOGICAL BOWEL NUTRITION STATUS   (none)   Continent Diet (heart healthy)  AMBULATORY STATUS COMMUNICATION OF NEEDS Skin   Total Care Verbally Normal                       Personal Care Assistance Level of Assistance  Total care       Total Care Assistance: Maximum assistance   Functional Limitations Info             SPECIAL CARE FACTORS FREQUENCY                       Contractures Contractures Info: Not present    Additional Factors Info  Code Status Code Status Info: full             Current Medications (08/03/2017):  This is the current hospital active medication list Current Facility-Administered Medications  Medication Dose Route Frequency Provider Last Rate Last Dose  . acetaminophen (TYLENOL) tablet 650 mg  650 mg Oral Q6H PRN Bettey Costa, MD       Or  .  acetaminophen (TYLENOL) suppository 650 mg  650 mg Rectal Q6H PRN Mody, Sital, MD      . albuterol (PROVENTIL) (2.5 MG/3ML) 0.083% nebulizer solution 2.5 mg  2.5 mg Nebulization Q3H PRN Mody, Sital, MD      . benztropine (COGENTIN) tablet 0.5 mg  0.5 mg Oral BID Benjie Karvonen, Sital, MD   0.5 mg at 08/03/17 1055  . cefTRIAXone (ROCEPHIN) 1 g in dextrose 5 % 50 mL IVPB  1 g Intravenous Q24H Bettey Costa, MD   Stopped at 08/02/17 1956  . clonazePAM (KLONOPIN) tablet 1 mg  1 mg Oral QHS Bettey Costa, MD   1 mg at 08/02/17 2113  . dextrose 10 % infusion   Intravenous Continuous Varughese, Bincy S, NP 50 mL/hr at 08/03/17 0311    . divalproex (DEPAKOTE SPRINKLE) capsule 375 mg  375 mg Oral TID Bettey Costa, MD   375 mg at 08/03/17 1055  . docusate sodium (COLACE) capsule 100 mg  100 mg Oral BID Bettey Costa, MD   100 mg at 08/03/17 1052  . fluPHENAZine (PROLIXIN) tablet 12.5 mg  12.5 mg Oral Daily Mody, Sital, MD   12.5 mg at 08/03/17 1052  . fluPHENAZine (PROLIXIN) tablet 15 mg  15 mg Oral QHS Mody, Sital,  MD   15 mg at 08/02/17 2112  . hydroxychloroquine (PLAQUENIL) tablet 200 mg  200 mg Oral BID Bettey Costa, MD   200 mg at 08/03/17 1056  . ipratropium-albuterol (DUONEB) 0.5-2.5 (3) MG/3ML nebulizer solution 3 mL  3 mL Nebulization Q4H Mody, Sital, MD   3 mL at 08/03/17 0746  . levothyroxine (SYNTHROID, LEVOTHROID) tablet 150 mcg  150 mcg Oral QAC breakfast Bettey Costa, MD   150 mcg at 08/03/17 0848  . MEDLINE mouth rinse  15 mL Mouth Rinse BID Wilhelmina Mcardle, MD   15 mL at 08/03/17 1057  . multivitamin with minerals tablet 1 tablet  1 tablet Oral Daily Bettey Costa, MD   1 tablet at 08/03/17 1052  . norepinephrine (LEVOPHED) 4 mg in dextrose 5 % 250 mL (0.016 mg/mL) infusion  0-40 mcg/min Intravenous Titrated Bettey Costa, MD 45 mL/hr at 08/03/17 1158 12 mcg/min at 08/03/17 1158  . ondansetron (ZOFRAN) tablet 4 mg  4 mg Oral Q6H PRN Bettey Costa, MD       Or  . ondansetron (ZOFRAN) injection 4 mg  4 mg Intravenous  Q6H PRN Mody, Sital, MD      . oxyCODONE-acetaminophen (PERCOCET/ROXICET) 5-325 MG per tablet 1-2 tablet  1-2 tablet Oral Q6H PRN Flora Lipps, MD   1 tablet at 08/03/17 1052  . senna-docusate (Senokot-S) tablet 1 tablet  1 tablet Oral QHS PRN Bettey Costa, MD      . tamsulosin (FLOMAX) capsule 0.4 mg  0.4 mg Oral Daily Mody, Sital, MD   0.4 mg at 08/03/17 1052  . traZODone (DESYREL) tablet 150 mg  150 mg Oral QHS Bettey Costa, MD   150 mg at 08/02/17 2112  . vitamin C (ASCORBIC ACID) tablet 500 mg  500 mg Oral BID Bettey Costa, MD   500 mg at 08/03/17 1056     Discharge Medications: Please see discharge summary for a list of discharge medications.  Relevant Imaging Results:  Relevant Lab Results:   Additional Information ss: [id number]  Shela Leff, LCSW

## 2017-08-03 NOTE — Procedures (Signed)
Central Venous Catheter Insertion Procedure Note- Right Internal Jugular Jerry [name] [id number] [birth year]  Procedure: Insertion of Central Venous Catheter Indications: Assessment of intravascular volume, Drug and/or fluid administration and Frequent blood sampling  Procedure Details Consent: Risks of procedure as well as the alternatives and risks of each were explained to the (patient/caregiver).  Consent for procedure obtained. Time Out: Verified patient identification, verified procedure, site/side was marked, verified correct patient position, special equipment/implants available, medications/allergies/relevent history reviewed, required imaging and test results available.  Performed  Maximum sterile technique was used including antiseptics, cap, gloves, gown, hand hygiene, mask and sheet. Skin prep: Chlorhexidine; local anesthetic administered A antimicrobial bonded/coated triple lumen catheter was placed in the right internal jugular vein using the Seldinger technique.  Evaluation Blood flow good Complications: No apparent complications Patient did tolerate procedure well. Chest X-ray ordered to verify placement.  CXR: pending.  Procedure performed under direct ultrasound guidance for real time vessel cannulation.      [facility] 08/03/2017, 2:52 AM

## 2017-08-03 NOTE — Consult Note (Signed)
Name: Jerry [name] MRN: [id number] DOB: [birth year]    ADMISSION DATE:  08/02/2017 CONSULTATION DATE: 08/02/2017  REFERRING MD : Dr. Benjie Karvonen    CHIEF COMPLAINT: Altered Mental Status   BRIEF PATIENT DESCRIPTION:  61 yo male admitted 09/6 with septic shock, hypotension, and hypothermia secondary to UTI requiring vasopressor therapy, acute encephalopath, and bradycardia    SIGNIFICANT EVENTS  09/5-Pt admitted to ICU   STUDIES:  CT Head 09/6>>No acute intracranial abnormality.  Generalized atrophy Sinus mucosal disease  HISTORY OF PRESENT ILLNESS:   This is a 61 year old male with a past medical history of hypothyroidism, rheumatoid arthritis, pressure ulcer, morbid obesity, hypertension, COPD, chronic pain, CHF, and BPH.  He presented to [facility] ER 09/6 from [facility] with bradycardia and altered mental status.  In the ER the pt was hypotensive and hypothermic ruling pt in for septic shock, therefore  he received 4.5L of IV fluids, however despite fluid resuscitation he remained hypotensive and levophed drip initiated.  He was subsequently admitted to ICU by hospitalist team for further workup and treatment PCCM consulted.     SUBJECTIVE  Remains on Vasopressors Lethargic Arousable, NAD CVL placed last night   REVIEW OF SYSTEMS:  Unable to assess pt confused    VITAL SIGNS: Temp:  [87.7 F (30.9 C)-97.6 F (36.4 C)] 96.6 F (35.9 C) (09/07 0600) Pulse Rate:  [46-91] 76 (09/07 0600) Resp:  [10-22] 12 (09/07 0600) BP: (58-112)/(37-81) 95/60 (09/07 0600) SpO2:  [94 %-100 %] 100 % (09/07 0600) Weight:  [306 lb 3.5 oz (138.9 kg)] 306 lb 3.5 oz (138.9 kg) (09/06 1500)  PHYSICAL EXAMINATION: General: acutely ill appearing AA male, NAD Neuro: confused, follows very simple commands and tracks, PERRL HEENT: supple, no JVD  Cardiovascular: sinus brady, no M/R/G Lungs: crackles and rhonchi throughout, even, non labored; no wheezes or rales  Abdomen: +BS x4, obese soft, non tender,  non distended   Musculoskeletal: moves upper extremities, 1+ generalized edema  Skin: intact no rashes or lesions    Recent Labs Lab 08/02/17 1102 08/03/17 0501  NA 143 141  K 4.4 4.2  CL 100* 104  CO2 36* 33*  BUN 35* 21*  CREATININE 0.65 0.56*  GLUCOSE 75 199*    Recent Labs Lab 08/02/17 1102 08/03/17 0501  HGB 11.4* 9.7*  HCT 34.3* 29.3*  WBC 2.2* 6.4  PLT 32* 32*   Ct Head Wo Contrast  Result Date: 08/02/2017 CLINICAL DATA:  Altered level of consciousness EXAM: CT HEAD WITHOUT CONTRAST TECHNIQUE: Contiguous axial images were obtained from the base of the skull through the vertex without intravenous contrast. COMPARISON:  06/28/2017 FINDINGS: Brain: Moderate atrophy unchanged. Negative for acute infarct. Negative for hemorrhage or mass lesion. Vascular: Negative for hyperdense vessel Skull: Negative Sinuses/Orbits: Air-fluid level in the sphenoid sinus. Mild mucosal edema in the maxillary sinuses bilaterally. Bilateral exophthalmos. No orbital mass. Other: None IMPRESSION: No acute intracranial abnormality.  Generalized atrophy Sinus mucosal disease Electronically Signed   By: Franchot Gallo M.D.   On: 08/02/2017 11:48   Dg Chest Port 1 View  Result Date: 08/03/2017 CLINICAL DATA:  Central line placement. EXAM: PORTABLE CHEST 1 VIEW COMPARISON:  Chest radiograph yesterday FINDINGS: Tip of the right central line in the distal SVC. No evidence of large pneumothorax, low lung volumes limit assessment. Low lung volumes persist, similar to prior exam. Stable cardiomegaly and pulmonary vascular congestion. Again seen left lung base scarring. Mild right infrahilar atelectasis. Otherwise no new abnormality. IMPRESSION: Tip of the right  central line in the distal SVC. No large pneumothorax. Stable cardiomegaly, vascular congestion and left basilar atelectasis/scarring. Mild right infrahilar atelectasis. Electronically Signed   By: Jeb Levering M.D.   On: 08/03/2017 03:21   Dg Chest  Port 1 View  Result Date: 08/02/2017 CLINICAL DATA:  Bradycardia. EXAM: PORTABLE CHEST 1 VIEW COMPARISON:  Radiograph of June 29, 2017. FINDINGS: Stable cardiomegaly. Atherosclerosis of thoracic aorta is noted. No pneumothorax is noted. Mild central pulmonary vascular congestion is noted. Stable left basilar atelectasis or scarring is noted. Bony thorax is unremarkable. IMPRESSION: Stable cardiomegaly with central pulmonary vascular congestion. Aortic atherosclerosis. Stable left basilar atelectasis or scarring. Electronically Signed   By: Marijo Conception, M.D.   On: 08/02/2017 11:36    ASSESSMENT / PLAN: Septic shock with hypotension secondary to UTI  Acute encephalopathy likely secondary to sepsis  Bradycardia likely secondary to beta blocker  Hypothermia  Thrombocytopenia  Hx: COPD, RA, CHF, and Morbid Obesity P: Supplemental O2 to maintain O2 sats 88% to 92% and/or for dyspnea  Prn CXR Scheduled and prn bronchodilator therapy  levophed gtt to maintain map >65 Hold outpatient hypertensive medications for now  Continuous telemetry monitoring  Trend WBC and monitor fever curve Trend PCT and lactic acid Follow cultures Continue abx  SCD's for VTE prophylaxis, avoid chemical prophylaxis  Prn warming blanket    Corrin Parker, M.D.  [facility]  Medical Director [place] Director [facility] Cardio-Pulmonary Department

## 2017-08-03 NOTE — Clinical Social Work Note (Signed)
Clinical Social Work Assessment  Patient Details  Name: Jerry [name] MRN: [id number] Date of Birth: Aug 24, 1956  Date of referral:  08/03/17               Reason for consult:  Facility Placement                Permission sought to share information with:    Permission granted to share information::     Name::        Agency::     Relationship::     Contact Information:     Housing/Transportation Living arrangements for the past 2 months:  [place] of Information:  Other (Comment Required) (niece: Jerry [name]: [phone]) Patient Interpreter Needed:  None Criminal Activity/Legal Involvement Pertinent to Current Situation/Hospitalization:  No - Comment as needed Significant Relationships:  Siblings (nieces) Lives with:  Facility Resident Do you feel safe going back to the place where you live?  Yes Need for family participation in patient care:  Yes (Comment)  Care giving concerns:  Patient is a long term resident at [facility].   Social Worker assessment / plan:  CSW contacted patient's niece, Mrs. Jerry [name], via phone and explained role and purpose of call. Mrs. Ray stated that patient has resided at [facility] for about 4 years and that family does wish for patient to return to [facility] at discharge. Mrs. Ray stated that patient's sister is local and able to visit him at the hospital and the nursing home.   Employment status:  Retired Forensic scientist:  Medicaid In [place] PT Recommendations:    Information / Referral to community resources:     Patient/Family's Response to care:  Patient's niece expressed appreciation for CSW assistance.  Patient/Family's Understanding of and Emotional Response to Diagnosis, Current Treatment, and Prognosis:  Mrs. Jerry [name] stated that she is communicating with patient's sister regularly and are involved with patient's care.  Emotional Assessment Appearance:    Attitude/Demeanor/Rapport:    Affect (typically observed):    Orientation:   Oriented to Self Alcohol / Substance use:  Not Applicable Psych involvement (Current and /or in the community):  No (Comment)  Discharge Needs  Concerns to be addressed:  Care Coordination Readmission within the last 30 days:  No Current discharge risk:  None Barriers to Discharge:  No Barriers Identified   Shela Leff, [place] 08/03/2017, 1:44 PM

## 2017-08-03 NOTE — Progress Notes (Signed)
Pt continues to have uncontrolled hypoglycemia throughout shift. NP aware. At beginning of shift pt started on D10 at 96ml/hr. Pt still BG increased then decreased again. Dextrose increased to 74ml/hr. Another amp D50 given. Will continue to monitor CBG q1hr. Pt BP also decreasing throughout shift. Levo titrated up to 10 mcg. NP made aware. Central line placed by NP @0300  for increased vasopressor therapy. Will continue to monitor

## 2017-08-03 NOTE — Progress Notes (Signed)
Initial Nutrition Assessment  DOCUMENTATION CODES:   Morbid obesity  INTERVENTION:  Will downgrade diet to dysphagia 3 with thin liquids as that is patient's usual diet at [facility].  Provide Premier Protein TID between meals, each supplement provides 160 kcal and 30 grams of protein.  Continue multivitamin with minerals daily.  NUTRITION DIAGNOSIS:   Increased nutrient needs (protein) related to wound healing (hx of stage I to b/l gluteals and sacrum) as evidenced by estimated needs.  GOAL:   Patient will meet greater than or equal to 90% of their needs   MONITOR:   PO intake, Supplement acceptance, Labs, Weight trends, Skin, I & O's  REASON FOR ASSESSMENT:   Malnutrition Screening Tool    ASSESSMENT:   61 year old male with PMHx of CHF, HTN, hypothyroidism, COPD, chronic pain, RA, BPH who presented from [facility] with bradycardia and AMS found to have septic shock with hypotension secondary to UTI, acute encephalopathy likely secondary to sepsis, hypothermia.   -Patient was on warming blanket but is now off.  Patient known to this RD from previous admission. Met with patient and his sister at bedside. Patient alert, but not responding to questions. Had noted on last assessment on 8/3 that he was able to respond slowly to questions. Sister reported she was unsure what diet patient is on, but she believes he eats pretty well usually. She reports last night he had some fresh fruit, 1/2 of his burger, banana pudding, and tea.   Spoke with RN from [facility] who cares for patient. She reports he is on a cardiac mechanical soft diet with extra protein and thin liquids.  Patient's sister is unsure of weight history. Limited weight history in chart. Patient was 302.9 lbs on 06/22/2017. He has remained fairly weight stable over the past month, though weight has fluctuated.  Medications reviewed and include: Colace, levothyroxine, MVI daily, vitamin C 500 mg BID,  ceftriaxone, D10 @ 50 ml/hr (120 grams dextrose, 408 kcal daily), Levophed gtt.  Labs reviewed: CBG 59-170 past 24 hrs, CO2 33, BUN 21, Creatinine 0.56, Anion gap 4.  Nutrition-Focused physical exam completed. Findings are no fat depletion, no muscle depletion, and mild edema.   Patient does not meet criteria for malnutrition at this time.  Discussed with RN. Patient also discussed on rounds. Patient has pain in legs. Per MD unsure when patient was diagnosed with CHF as echo from 06/26/2017 was normal.  Diet Order:  Diet Heart Room service appropriate? Yes; Fluid consistency: Thin  Skin:  Reviewed, no issues On previous admission patient had Stage I to b/l gluteals and sacrum and skin tear to right neck  Last BM:  Unknown  Height:   Ht Readings from Last 1 Encounters:  08/02/17 6' (1.829 m)    Weight:   Wt Readings from Last 1 Encounters:  08/02/17 (!) 306 lb 3.5 oz (138.9 kg)    Ideal Body Weight:  80.9 kg  BMI:  Body mass index is 41.53 kg/m.  Estimated Nutritional Needs:   Kcal:  5573-2202 (MSJ x 1.1-1.2)  Protein:  140-165 grams (1-1.2 grams/kg)  Fluid:  2.4-2.8 L/day (30-35 ml/kg IBW)  EDUCATION NEEDS:   No education needs identified at this time  Willey Blade, MS, RD, LDN Pager: [phone] After Hours Pager: [phone]

## 2017-08-03 NOTE — Progress Notes (Signed)
Pharmacy Antibiotic Note  Jerry [name] is a 61 y.o. male admitted on 08/02/2017 with sepsis/ UTI.  Pharmacy has been consulted for ceftriaxone dosing. Patient received vancomycin, aztreonam, and levofloxacin in the ED. Patient received ceftazidine on previous admission.   Plan: Continue ceftriaxone 1g IV q24  Total of 5 days   Height: 6' (182.9 cm) Weight: (!) 306 lb 3.5 oz (138.9 kg) IBW/kg (Calculated) : 77.6  Temp (24hrs), Avg:92.2 F (33.4 C), Min:87.7 F (30.9 C), Max:96.8 F (36 C)   Recent Labs Lab 08/02/17 1102 08/03/17 0501  WBC 2.2* 6.4  CREATININE 0.65 0.56*  LATICACIDVEN 1.0  --     Estimated Creatinine Clearance: 140 mL/min (A) (by C-G formula based on SCr of 0.56 mg/dL (L)).    Allergies  Allergen Reactions  . Methadone   . Penicillins     .Has patient had a PCN reaction causing immediate rash, facial/tongue/throat swelling, SOB or lightheadedness with hypotension: Unknown Has patient had a PCN reaction causing severe rash involving mucus membranes or skin necrosis: Unknown Has patient had a PCN reaction that required hospitalization: Unknown Has patient had a PCN reaction occurring within the last 10 years: Unknown If all of the above answers are "NO", then may proceed with Cephalosporin use.   Marland Kitchen Propoxyphene   . Sulfa Antibiotics     Antimicrobials this admission: 9/6 Aztreonam >> 9/6 9/6 Levofloxacin >> 9/6 9/6 Vancomycin >> 9/6 9/6 Ceftriaxone >>  Dose adjustments this admission: N/A  Microbiology results: 9/6 BCx: no growth <24 hours  9/6 UCx: >100k, gram negative rods  9/6 MRSA PCR: negative  Thank you for allowing pharmacy to be a part of this patient's care.  Durwin Reges, PharmD Student 08/03/2017 11:06 AM

## 2017-08-04 LAB — BLOOD CULTURE ID PANEL (REFLEXED)
Acinetobacter baumannii: NOT DETECTED
CANDIDA GLABRATA: NOT DETECTED
CANDIDA TROPICALIS: NOT DETECTED
Candida albicans: NOT DETECTED
Candida krusei: NOT DETECTED
Candida parapsilosis: NOT DETECTED
Enterobacter cloacae complex: NOT DETECTED
Enterobacteriaceae species: NOT DETECTED
Enterococcus species: NOT DETECTED
Escherichia coli: NOT DETECTED
HAEMOPHILUS INFLUENZAE: NOT DETECTED
KLEBSIELLA OXYTOCA: NOT DETECTED
KLEBSIELLA PNEUMONIAE: NOT DETECTED
Listeria monocytogenes: NOT DETECTED
Methicillin resistance: DETECTED — AB
Neisseria meningitidis: NOT DETECTED
Proteus species: NOT DETECTED
Pseudomonas aeruginosa: NOT DETECTED
SERRATIA MARCESCENS: NOT DETECTED
STAPHYLOCOCCUS SPECIES: DETECTED — AB
STREPTOCOCCUS SPECIES: NOT DETECTED
Staphylococcus aureus (BCID): NOT DETECTED
Streptococcus agalactiae: NOT DETECTED
Streptococcus pneumoniae: NOT DETECTED
Streptococcus pyogenes: NOT DETECTED

## 2017-08-04 LAB — CBC
HCT: 28.3 % — ABNORMAL LOW (ref 40.0–52.0)
Hemoglobin: 9.4 g/dL — ABNORMAL LOW (ref 13.0–18.0)
MCH: 29.4 pg (ref 26.0–34.0)
MCHC: 33.4 g/dL (ref 32.0–36.0)
MCV: 88.3 fL (ref 80.0–100.0)
Platelets: 27 10*3/uL — CL (ref 150–440)
RBC: 3.2 MIL/uL — AB (ref 4.40–5.90)
RDW: 17.3 % — ABNORMAL HIGH (ref 11.5–14.5)
WBC: 4.7 10*3/uL (ref 3.8–10.6)

## 2017-08-04 LAB — URINE CULTURE

## 2017-08-04 LAB — GLUCOSE, CAPILLARY
GLUCOSE-CAPILLARY: 98 mg/dL (ref 65–99)
GLUCOSE-CAPILLARY: 74 mg/dL (ref 65–99)
GLUCOSE-CAPILLARY: 71 mg/dL (ref 65–99)
GLUCOSE-CAPILLARY: 81 mg/dL (ref 65–99)
GLUCOSE-CAPILLARY: 79 mg/dL (ref 65–99)
GLUCOSE-CAPILLARY: 137 mg/dL — AB (ref 65–99)
GLUCOSE-CAPILLARY: 63 mg/dL — AB (ref 65–99)
GLUCOSE-CAPILLARY: 137 mg/dL — AB (ref 65–99)
GLUCOSE-CAPILLARY: 79 mg/dL (ref 65–99)
GLUCOSE-CAPILLARY: 79 mg/dL (ref 65–99)
GLUCOSE-CAPILLARY: 79 mg/dL (ref 65–99)
Glucose-Capillary: 121 mg/dL — ABNORMAL HIGH (ref 65–99)
Glucose-Capillary: 65 mg/dL (ref 65–99)
Glucose-Capillary: 68 mg/dL (ref 65–99)
Glucose-Capillary: 70 mg/dL (ref 65–99)
Glucose-Capillary: 73 mg/dL (ref 65–99)
Glucose-Capillary: 75 mg/dL (ref 65–99)
Glucose-Capillary: 77 mg/dL (ref 65–99)
Glucose-Capillary: 84 mg/dL (ref 65–99)
Glucose-Capillary: 85 mg/dL (ref 65–99)
Glucose-Capillary: 86 mg/dL (ref 65–99)
Glucose-Capillary: 95 mg/dL (ref 65–99)
Glucose-Capillary: 95 mg/dL (ref 65–99)
Glucose-Capillary: 99 mg/dL (ref 65–99)

## 2017-08-04 LAB — BASIC METABOLIC PANEL
Anion gap: 3 — ABNORMAL LOW (ref 5–15)
BUN: 9 mg/dL (ref 6–20)
CO2: 35 mmol/L — ABNORMAL HIGH (ref 22–32)
Calcium: 8.7 mg/dL — ABNORMAL LOW (ref 8.9–10.3)
Chloride: 105 mmol/L (ref 101–111)
Creatinine, Ser: 0.39 mg/dL — ABNORMAL LOW (ref 0.61–1.24)
GFR calc Af Amer: >60 mL/min (ref >60)
Glucose, Bld: 91 mg/dL (ref 65–99)
POTASSIUM: 4.1 mmol/L (ref 3.5–5.1)
SODIUM: 143 mmol/L (ref 135–145)

## 2017-08-04 MED ORDER — DEXTROSE 50 % IV SOLN
INTRAVENOUS | Status: AC
  Administered 2017-08-04: 50 mL
  Filled 2017-08-04: qty 50

## 2017-08-04 MED ORDER — DEXTROSE 50 % IV SOLN
INTRAVENOUS | Status: AC
  Filled 2017-08-04: qty 50

## 2017-08-04 MED ORDER — HYDROCORTISONE NA SUCCINATE PF 100 MG IJ SOLR
50.0000 mg | Freq: Four times a day (QID) | INTRAMUSCULAR | Status: DC
  Administered 2017-08-04 – 2017-08-06 (×7): 50 mg via INTRAVENOUS
  Filled 2017-08-04 (×7): qty 2

## 2017-08-04 MED ORDER — SODIUM CHLORIDE 0.9 % IV SOLN
1.0000 g | Freq: Three times a day (TID) | INTRAVENOUS | Status: DC
  Administered 2017-08-04 – 2017-08-06 (×6): 1 g via INTRAVENOUS
  Filled 2017-08-04 (×9): qty 1

## 2017-08-04 MED ORDER — LEVOTHYROXINE SODIUM 150 MCG PO TABS
150.0000 ug | ORAL_TABLET | Freq: Every day | ORAL | Status: DC
  Administered 2017-08-04 – 2017-08-09 (×4): 150 ug via ORAL
  Filled 2017-08-04 (×5): qty 1
  Filled 2017-08-04: qty 3
  Filled 2017-08-04: qty 1
  Filled 2017-08-04: qty 3

## 2017-08-04 MED ORDER — DEXTROSE 50 % IV SOLN: 50.0000 mL | Freq: Once | INTRAVENOUS | Status: DC

## 2017-08-04 MED ORDER — DEXTROSE 50 % IV SOLN
50.0000 mL | Freq: Once | INTRAVENOUS | Status: AC
  Administered 2017-08-04: 50 mL via INTRAVENOUS

## 2017-08-04 NOTE — Progress Notes (Signed)
Pt resting well throughout night. No complaints of pain. Pt hypothermic, warming blanket applied, temp increased, blanket removed. Pt CBG remaining stable. Will continue to monitor. Critical platelet of 27, NP notified and aware. No new orders at this time.

## 2017-08-04 NOTE — Progress Notes (Signed)
[facility] [place] NAME: Jerry [name]    MR#:  [id number]  DATE OF BIRTH:  Nov 01, 1956    CHIEF COMPLAINT:   Chief Complaint  Patient presents with  . Bradycardia   Patient is arousable but still lethargic. REVIEW OF SYSTEMS:   Review of Systems  Unable to perform ROS: Mental status change     DRUG ALLERGIES:   Allergies  Allergen Reactions  . Methadone   . Penicillins     .Has patient had a PCN reaction causing immediate rash, facial/tongue/throat swelling, SOB or lightheadedness with hypotension: Unknown Has patient had a PCN reaction causing severe rash involving mucus membranes or skin necrosis: Unknown Has patient had a PCN reaction that required hospitalization: Unknown Has patient had a PCN reaction occurring within the last 10 years: Unknown If all of the above answers are "NO", then may proceed with Cephalosporin use.   Marland Kitchen Propoxyphene   . Sulfa Antibiotics     VITALS:  Blood pressure (!) 97/57, pulse 85, temperature (!) 96.3 F (35.7 C), resp. rate 14, height 6' (1.829 m), weight (!) 138.2 kg (304 lb 10.8 oz), SpO2 100 %.  PHYSICAL EXAMINATION:  GENERAL:  61 y.o.-year-old patient lying in the bed with no acute distress. Confused/ EYES: Pupils equal, round, reactive to light.. No scleral icterus. Extraocular muscles intact.  HEENT: Head atraumatic, normocephalic. Oropharynx and nasopharynx clear.  NECK:  Supple, no jugular venous distention. No thyroid enlargement, no tenderness.  LUNGS: Normal breath sounds bilaterally, no wheezing, rales,rhonchi or crepitation. No use of accessory muscles of respiration.  CARDIOVASCULAR: S1, S2 normal. No murmurs, rubs, or gallops.  ABDOMEN: Soft, nontender, nondistended. Bowel sounds present. No organomegaly or mass.  EXTREMITIES: No pedal edema, cyanosis, or clubbing.  NEUROLOGIC: confused, unable to obtain full neurological exam.  PSYCHIATRIC: confused. SKIN: No  obvious rash, lesion, or ulcer.    LABORATORY PANEL:   CBC  Recent Labs Lab 08/04/17 0439  WBC 4.7  HGB 9.4*  HCT 28.3*  PLT 27*   ------------------------------------------------------------------------------------------------------------------  Chemistries   Recent Labs Lab 08/02/17 1102  08/04/17 0439  NA 143  < > 143  K 4.4  < > 4.1  CL 100*  < > 105  CO2 36*  < > 35*  GLUCOSE 75  < > 91  BUN 35*  < > 9  CREATININE 0.65  < > 0.39*  CALCIUM 9.3  < > 8.7*  AST 38  --   --   ALT 35  --   --   ALKPHOS 92  --   --   BILITOT 0.3  --   --   < > = values in this interval not displayed. ------------------------------------------------------------------------------------------------------------------  Cardiac Enzymes  Recent Labs Lab 08/02/17 1102  TROPONINI <0.03   ------------------------------------------------------------------------------------------------------------------  RADIOLOGY:  Dg Chest Port 1 View  Result Date: 08/03/2017 CLINICAL DATA:  Central line placement. EXAM: PORTABLE CHEST 1 VIEW COMPARISON:  Chest radiograph yesterday FINDINGS: Tip of the right central line in the distal SVC. No evidence of large pneumothorax, low lung volumes limit assessment. Low lung volumes persist, similar to prior exam. Stable cardiomegaly and pulmonary vascular congestion. Again seen left lung base scarring. Mild right infrahilar atelectasis. Otherwise no new abnormality. IMPRESSION: Tip of the right central line in the distal SVC. No large pneumothorax. Stable cardiomegaly, vascular congestion and left basilar atelectasis/scarring. Mild right infrahilar atelectasis. Electronically Signed   By: Jeb Levering M.D.   On:  08/03/2017 03:21    EKG:   Orders placed or performed during the hospital encounter of 08/02/17  . EKG 12-Lead  . EKG 12-Lead  . EKG 12-Lead  . EKG 12-Lead    ASSESSMENT AND PLAN:   #Septic shock with hypotension secondary to UTI Wean off  pressors Levophed urine cultures are showing Escherichia coli, Enterococcus faecalis. Sensitive to imipenem, Zosyn but resistant to ceftriaxone. Discontinued her Rocephin . Start patient on meropenem  #Acute encephalopathy secondary to septic shock.  Neuro checks, IV antibiotics  #Thrombocytopenia secondary to sepsis no active bleeding. Monitor platelets Platelet count 27,000  # bradycardia: Improved.  # hypothermia: Improved.  #.rheumatoid arthritis   Schizophrenia # .prognosis poor.-No family members available at bedside  All the records are reviewed and case discussed with Care Management/Social Workerr. Management plans discussed with the patient, family and they are in agreement.  CODE STATUS: full  TOTAL TIME TAKING CARE OF THIS  Patient;35 min  POSSIBLE D/C IN ?DAYS, DEPENDING ON CLINICAL CONDITION.   Nicholes Mango M.D on 08/04/2017 at 4:00 PM  Between 7am to 6pm - Pager - [phone]  After 6pm go to [email or web address] - password [email or web address] [facility]  Office  [phone]  CC: Primary care physician; [facility]   Note: This dictation was prepared with Dragon dictation along with smaller phrase technology. Any transcriptional errors that result from this process are unintentional.

## 2017-08-04 NOTE — Progress Notes (Signed)
Name: Jerry [name] MRN: [id number] DOB: May 15, 1956    ADMISSION DATE:  08/02/2017 CONSULTATION DATE: 08/02/2017  REFERRING MD : Dr. Benjie Karvonen    CHIEF COMPLAINT: Altered Mental Status   BRIEF PATIENT DESCRIPTION:  61 yo male admitted 09/6 with septic shock, hypotension, and hypothermia secondary to UTI requiring vasopressor therapy, acute encephalopath, and bradycardia    SIGNIFICANT EVENTS  09/5-Pt admitted to ICU   STUDIES:  CT Head 09/6>>No acute intracranial abnormality.  Generalized atrophy Sinus mucosal disease  HISTORY OF PRESENT ILLNESS:   This is a 61 year old male with a past medical history of hypothyroidism, rheumatoid arthritis, pressure ulcer, morbid obesity, hypertension, COPD, chronic pain, CHF, and BPH.  He presented to [facility] ER 09/6 from [facility] with bradycardia and altered mental status.  In the ER the pt was hypotensive and hypothermic ruling pt in for septic shock, therefore  he received 4.5L of IV fluids, however despite fluid resuscitation he remained hypotensive and levophed drip initiated.  He was subsequently admitted to ICU by hospitalist team for further workup and treatment PCCM consulted.     SUBJECTIVE  Weaning off pressors. More alert and responsive. 1/2 blood cultures positive for staph species with methicillin resistance. Hypothermic with temp of 34.9 degrees C. Urine culture positive for E-Coli and E-faecalis. Currently on rocephin. No other acute issues  VITAL SIGNS: Temp:  [94.8 F (34.9 C)-100.4 F (38 C)] 100.4 F (38 C) (09/08 0600) Pulse Rate:  [75-94] 94 (09/08 0600) Resp:  [10-23] 19 (09/08 0600) BP: (85-123)/(47-70) 103/56 (09/08 0600) SpO2:  [90 %-100 %] 96 % (09/08 0600) Weight:  [304 lb 10.8 oz (138.2 kg)] 304 lb 10.8 oz (138.2 kg) (09/08 0449)  PHYSICAL EXAMINATION: General: acutely ill appearing AA male, NAD Neuro: awake, follows very simple commands and tracks, PERRLA HEENT: supple, no JVD  Cardiovascular: sinus brady, no  M/R/G Lungs: crackles and rhonchi throughout, even, non labored; no wheezes or rales  Abdomen: +BS x4, obese soft, non tender, non distended   Musculoskeletal: moves upper extremities, 1+ generalized edema  Skin: intact no rashes or lesions    Recent Labs Lab 08/02/17 1102 08/03/17 0501 08/04/17 0439  NA 143 141 143  K 4.4 4.2 4.1  CL 100* 104 105  CO2 36* 33* 35*  BUN 35* 21* 9  CREATININE 0.65 0.56* 0.39*  GLUCOSE 75 199* 91    Recent Labs Lab 08/02/17 1102 08/03/17 0501 08/04/17 0439  HGB 11.4* 9.7* 9.4*  HCT 34.3* 29.3* 28.3*  WBC 2.2* 6.4 4.7  PLT 32* 32* 27*   Ct Head Wo Contrast  Result Date: 08/02/2017 CLINICAL DATA:  Altered level of consciousness EXAM: CT HEAD WITHOUT CONTRAST TECHNIQUE: Contiguous axial images were obtained from the base of the skull through the vertex without intravenous contrast. COMPARISON:  06/28/2017 FINDINGS: Brain: Moderate atrophy unchanged. Negative for acute infarct. Negative for hemorrhage or mass lesion. Vascular: Negative for hyperdense vessel Skull: Negative Sinuses/Orbits: Air-fluid level in the sphenoid sinus. Mild mucosal edema in the maxillary sinuses bilaterally. Bilateral exophthalmos. No orbital mass. Other: None IMPRESSION: No acute intracranial abnormality.  Generalized atrophy Sinus mucosal disease Electronically Signed   By: Franchot Gallo M.D.   On: 08/02/2017 11:48   Dg Chest Port 1 View  Result Date: 08/03/2017 CLINICAL DATA:  Central line placement. EXAM: PORTABLE CHEST 1 VIEW COMPARISON:  Chest radiograph yesterday FINDINGS: Tip of the right central line in the distal SVC. No evidence of large pneumothorax, low lung volumes limit assessment. Low lung volumes  persist, similar to prior exam. Stable cardiomegaly and pulmonary vascular congestion. Again seen left lung base scarring. Mild right infrahilar atelectasis. Otherwise no new abnormality. IMPRESSION: Tip of the right central line in the distal SVC. No large pneumothorax.  Stable cardiomegaly, vascular congestion and left basilar atelectasis/scarring. Mild right infrahilar atelectasis. Electronically Signed   By: Jeb Levering M.D.   On: 08/03/2017 03:21   Dg Chest Port 1 View  Result Date: 08/02/2017 CLINICAL DATA:  Bradycardia. EXAM: PORTABLE CHEST 1 VIEW COMPARISON:  Radiograph of June 29, 2017. FINDINGS: Stable cardiomegaly. Atherosclerosis of thoracic aorta is noted. No pneumothorax is noted. Mild central pulmonary vascular congestion is noted. Stable left basilar atelectasis or scarring is noted. Bony thorax is unremarkable. IMPRESSION: Stable cardiomegaly with central pulmonary vascular congestion. Aortic atherosclerosis. Stable left basilar atelectasis or scarring. Electronically Signed   By: Marijo Conception, M.D.   On: 08/02/2017 11:36    ASSESSMENT / PLAN: Septic shock with hypotension secondary to UTI  Acute encephalopathy likely secondary to sepsis  Bradycardia likely secondary to beta blocker  Hypothermia  Thrombocytopenia  Hx: COPD, RA, CHF, and Morbid Obesity P: Supplemental O2 PRN to maintain O2 sats 88% to 92% and/or for dyspnea  Prn CXR Scheduled and prn bronchodilator therapy  levophed gtt to maintain map >65 Hold outpatient hypertensive medications for now  Continuous telemetry monitoring  Trend WBC and monitor fever curve Trend PCT and lactic acid Follow cultures Continue abx  SCD's for VTE prophylaxis, avoid chemical prophylaxis  Prn warming blanket  No family at bedside. Will update when available     Pihu Basil S. [facility] ANP-BC Pulmonary and Critical Care Medicine [facility] Pager [phone] or [phone]

## 2017-08-04 NOTE — Progress Notes (Signed)
Patient alert to voice. Has been non-verbal. Intermit follows commands. Titrated levophed throughout the day. Dextrose given three times per Dr Mortimer Fries and protocol. POA visited this afternoon. Foley intact. Hypothermic, placed warming blanket. Swallowing evaluation pending due to patients swallowing and coughing this am.

## 2017-08-04 NOTE — Progress Notes (Signed)
PHARMACY - PHYSICIAN COMMUNICATION CRITICAL VALUE ALERT - BLOOD CULTURE IDENTIFICATION (BCID)  Results for orders placed or performed during the hospital encounter of 08/02/17  Blood Culture ID Panel (Reflexed) (Collected: 08/02/2017 11:04 AM)  Result Value Ref Range   Enterococcus species NOT DETECTED NOT DETECTED   Listeria monocytogenes NOT DETECTED NOT DETECTED   Staphylococcus species DETECTED (A) NOT DETECTED   Staphylococcus aureus NOT DETECTED NOT DETECTED   Methicillin resistance DETECTED (A) NOT DETECTED   Streptococcus species NOT DETECTED NOT DETECTED   Streptococcus agalactiae NOT DETECTED NOT DETECTED   Streptococcus pneumoniae NOT DETECTED NOT DETECTED   Streptococcus pyogenes NOT DETECTED NOT DETECTED   Acinetobacter baumannii NOT DETECTED NOT DETECTED   Enterobacteriaceae species NOT DETECTED NOT DETECTED   Enterobacter cloacae complex NOT DETECTED NOT DETECTED   Escherichia coli NOT DETECTED NOT DETECTED   Klebsiella oxytoca NOT DETECTED NOT DETECTED   Klebsiella pneumoniae NOT DETECTED NOT DETECTED   Proteus species NOT DETECTED NOT DETECTED   Serratia marcescens NOT DETECTED NOT DETECTED   Haemophilus influenzae NOT DETECTED NOT DETECTED   Neisseria meningitidis NOT DETECTED NOT DETECTED   Pseudomonas aeruginosa NOT DETECTED NOT DETECTED   Candida albicans NOT DETECTED NOT DETECTED   Candida glabrata NOT DETECTED NOT DETECTED   Candida krusei NOT DETECTED NOT DETECTED   Candida parapsilosis NOT DETECTED NOT DETECTED   Candida tropicalis NOT DETECTED NOT DETECTED    Name of physician (or Provider) Contacted: Tukov  Changes to prescribed antibiotics required: n/a. Monitor clinically, patient improving.  Yajahira Tison S 08/04/2017  6:25 AM

## 2017-08-04 NOTE — Progress Notes (Signed)
Pharmacy Antibiotic Note  Jerry [name] is a 61 y.o. male admitted on 08/02/2017 with UTI.  Pharmacy has been consulted for merrem dosing.  Plan: merrem 1gm iv q8h  Height: 6' (182.9 cm) Weight: (!) 304 lb 10.8 oz (138.2 kg) IBW/kg (Calculated) : 77.6  Temp (24hrs), Avg:97.4 F (36.3 C), Min:94.8 F (34.9 C), Max:100.4 F (38 C)   Recent Labs Lab 08/02/17 1102 08/03/17 0501 08/04/17 0439  WBC 2.2* 6.4 4.7  CREATININE 0.65 0.56* 0.39*  LATICACIDVEN 1.0  --   --     Estimated Creatinine Clearance: 139.6 mL/min (A) (by C-G formula based on SCr of 0.39 mg/dL (L)).    Allergies  Allergen Reactions  . Methadone   . Penicillins     .Has patient had a PCN reaction causing immediate rash, facial/tongue/throat swelling, SOB or lightheadedness with hypotension: Unknown Has patient had a PCN reaction causing severe rash involving mucus membranes or skin necrosis: Unknown Has patient had a PCN reaction that required hospitalization: Unknown Has patient had a PCN reaction occurring within the last 10 years: Unknown If all of the above answers are "NO", then may proceed with Cephalosporin use.   Marland Kitchen Propoxyphene   . Sulfa Antibiotics     Antimicrobials this admission: 9/6 aztreonam >> 9/6  9/6 ceftriaxone >> 9/8 9/9 merrem >>    Microbiology results: Recent Results (from the past 240 hour(s))  Blood Culture (routine x 2)     Status: None (Preliminary result)   Collection Time: 08/02/17 11:04 AM  Result Value Ref Range Status   Specimen Description BLOOD BLOOD RIGHT FOREARM  Final   Special Requests   Final    BOTTLES DRAWN AEROBIC AND ANAEROBIC Blood Culture adequate volume   Culture  Setup Time   Final    AEROBIC BOTTLE ONLY GRAM POSITIVE COCCI CRITICAL RESULT CALLED TO, READ BACK BY AND VERIFIED WITH: MATT MCBANE ON 08/04/17 AT 0932 [facility]    Culture GRAM POSITIVE COCCI  Final   Report Status PENDING  Incomplete  Urine culture     Status: Abnormal   Collection Time: 08/02/17  11:04 AM  Result Value Ref Range Status   Specimen Description URINE, RANDOM  Final   Special Requests NONE  Final   Culture (A)  Final    >=100,000 COLONIES/mL ESCHERICHIA COLI Confirmed Extended Spectrum Beta-Lactamase Producer (ESBL) >=100,000 COLONIES/mL ENTEROCOCCUS FAECALIS    Report Status 08/04/2017 FINAL  Final   Organism ID, Bacteria ESCHERICHIA COLI (A)  Final   Organism ID, Bacteria ENTEROCOCCUS FAECALIS (A)  Final      Susceptibility   Escherichia coli - MIC*    AMPICILLIN >=32 RESISTANT Resistant     CEFAZOLIN >=64 RESISTANT Resistant     CEFTRIAXONE RESISTANT Resistant     CIPROFLOXACIN >=4 RESISTANT Resistant     GENTAMICIN <=1 SENSITIVE Sensitive     IMIPENEM <=0.25 SENSITIVE Sensitive     NITROFURANTOIN <=16 SENSITIVE Sensitive     TRIMETH/SULFA <=20 SENSITIVE Sensitive     AMPICILLIN/SULBACTAM 4 SENSITIVE Sensitive     PIP/TAZO <=4 SENSITIVE Sensitive     Extended ESBL POSITIVE Resistant     * >=100,000 COLONIES/mL ESCHERICHIA COLI   Enterococcus faecalis - MIC*    AMPICILLIN <=2 SENSITIVE Sensitive     LEVOFLOXACIN 0.5 SENSITIVE Sensitive     NITROFURANTOIN <=16 SENSITIVE Sensitive     VANCOMYCIN 1 SENSITIVE Sensitive     * >=100,000 COLONIES/mL ENTEROCOCCUS FAECALIS  Blood Culture ID Panel (Reflexed)     Status:  Abnormal   Collection Time: 08/02/17 11:04 AM  Result Value Ref Range Status   Enterococcus species NOT DETECTED NOT DETECTED Final   Listeria monocytogenes NOT DETECTED NOT DETECTED Final   Staphylococcus species DETECTED (A) NOT DETECTED Final    Comment: Methicillin (oxacillin) resistant coagulase negative staphylococcus. Possible blood culture contaminant (unless isolated from more than one blood culture draw or clinical case suggests pathogenicity). No antibiotic treatment is indicated for blood  culture contaminants. CRITICAL RESULT CALLED TO, READ BACK BY AND VERIFIED WITH: MATT MCBANE ON 08/04/17 AT 0612 [facility]    Staphylococcus aureus NOT  DETECTED NOT DETECTED Final   Methicillin resistance DETECTED (A) NOT DETECTED Final    Comment: CRITICAL RESULT CALLED TO, READ BACK BY AND VERIFIED WITH: MATT MCBANE ON 08/04/17 AT 0612 [place]    Streptococcus species NOT DETECTED NOT DETECTED Final   Streptococcus agalactiae NOT DETECTED NOT DETECTED Final   Streptococcus pneumoniae NOT DETECTED NOT DETECTED Final   Streptococcus pyogenes NOT DETECTED NOT DETECTED Final   Acinetobacter baumannii NOT DETECTED NOT DETECTED Final   Enterobacteriaceae species NOT DETECTED NOT DETECTED Final   Enterobacter cloacae complex NOT DETECTED NOT DETECTED Final   Escherichia coli NOT DETECTED NOT DETECTED Final   Klebsiella oxytoca NOT DETECTED NOT DETECTED Final   Klebsiella pneumoniae NOT DETECTED NOT DETECTED Final   Proteus species NOT DETECTED NOT DETECTED Final   Serratia marcescens NOT DETECTED NOT DETECTED Final   Haemophilus influenzae NOT DETECTED NOT DETECTED Final   Neisseria meningitidis NOT DETECTED NOT DETECTED Final   Pseudomonas aeruginosa NOT DETECTED NOT DETECTED Final   Candida albicans NOT DETECTED NOT DETECTED Final   Candida glabrata NOT DETECTED NOT DETECTED Final   Candida krusei NOT DETECTED NOT DETECTED Final   Candida parapsilosis NOT DETECTED NOT DETECTED Final   Candida tropicalis NOT DETECTED NOT DETECTED Final  Blood Culture (routine x 2)     Status: None (Preliminary result)   Collection Time: 08/02/17 11:09 AM  Result Value Ref Range Status   Specimen Description BLOOD BLOOD RIGHT ARM  Final   Special Requests   Final    BOTTLES DRAWN AEROBIC AND ANAEROBIC Blood Culture results may not be optimal due to an inadequate volume of blood received in culture bottles   Culture NO GROWTH 2 DAYS  Final   Report Status PENDING  Incomplete  MRSA PCR Screening     Status: None   Collection Time: 08/02/17  2:48 PM  Result Value Ref Range Status   MRSA by PCR NEGATIVE NEGATIVE Final    Comment:        The GeneXpert MRSA  Assay (FDA approved for NASAL specimens only), is one component of a comprehensive MRSA colonization surveillance program. It is not intended to diagnose MRSA infection nor to guide or monitor treatment for MRSA infections.    Thank you for allowing pharmacy to be a part of this patient's care.  Donna Christen Erinne Gillentine 08/04/2017 4:14 PM

## 2017-08-05 LAB — GLUCOSE, CAPILLARY
GLUCOSE-CAPILLARY: 98 mg/dL (ref 65–99)
GLUCOSE-CAPILLARY: 99 mg/dL (ref 65–99)
GLUCOSE-CAPILLARY: 96 mg/dL (ref 65–99)
GLUCOSE-CAPILLARY: 99 mg/dL (ref 65–99)
GLUCOSE-CAPILLARY: 84 mg/dL (ref 65–99)
GLUCOSE-CAPILLARY: 98 mg/dL (ref 65–99)
GLUCOSE-CAPILLARY: 105 mg/dL — AB (ref 65–99)
GLUCOSE-CAPILLARY: 109 mg/dL — AB (ref 65–99)
GLUCOSE-CAPILLARY: 122 mg/dL — AB (ref 65–99)
Glucose-Capillary: 96 mg/dL (ref 65–99)
Glucose-Capillary: 97 mg/dL (ref 65–99)
Glucose-Capillary: 104 mg/dL — ABNORMAL HIGH (ref 65–99)
Glucose-Capillary: 95 mg/dL (ref 65–99)
Glucose-Capillary: 98 mg/dL (ref 65–99)
Glucose-Capillary: 86 mg/dL (ref 65–99)
Glucose-Capillary: 110 mg/dL — ABNORMAL HIGH (ref 65–99)
Glucose-Capillary: 93 mg/dL (ref 65–99)
Glucose-Capillary: 96 mg/dL (ref 65–99)
Glucose-Capillary: 103 mg/dL — ABNORMAL HIGH (ref 65–99)
Glucose-Capillary: 112 mg/dL — ABNORMAL HIGH (ref 65–99)
Glucose-Capillary: 128 mg/dL — ABNORMAL HIGH (ref 65–99)

## 2017-08-05 LAB — CBC
HCT: 28.6 % — ABNORMAL LOW (ref 40.0–52.0)
HEMOGLOBIN: 9.6 g/dL — AB (ref 13.0–18.0)
MCH: 29.6 pg (ref 26.0–34.0)
MCHC: 33.6 g/dL (ref 32.0–36.0)
MCV: 88.2 fL (ref 80.0–100.0)
PLATELETS: 16 10*3/uL — AB (ref 150–440)
RBC: 3.24 MIL/uL — AB (ref 4.40–5.90)
RDW: 17.6 % — ABNORMAL HIGH (ref 11.5–14.5)
WBC: 5 10*3/uL (ref 3.8–10.6)

## 2017-08-05 LAB — CULTURE, BLOOD (ROUTINE X 2): SPECIAL REQUESTS: ADEQUATE

## 2017-08-05 LAB — PHOSPHORUS: Phosphorus: 4 mg/dL (ref 2.5–4.6)

## 2017-08-05 LAB — MAGNESIUM: Magnesium: 2.1 mg/dL (ref 1.7–2.4)

## 2017-08-05 MED ORDER — IPRATROPIUM-ALBUTEROL 0.5-2.5 (3) MG/3ML IN SOLN
3.0000 mL | Freq: Three times a day (TID) | RESPIRATORY_TRACT | Status: DC
  Administered 2017-08-05 – 2017-08-06 (×2): 3 mL via RESPIRATORY_TRACT
  Filled 2017-08-05 (×2): qty 3

## 2017-08-05 NOTE — Progress Notes (Signed)
   Name: Jerry [name] MRN: [id number] DOB: September 25, 1956    ADMISSION DATE:  08/02/2017 CONSULTATION DATE: 08/02/2017  REFERRING MD : Dr. Benjie Karvonen    CHIEF COMPLAINT: Altered Mental Status   BRIEF PATIENT DESCRIPTION:  61 yo male admitted 09/6 with septic shock, hypotension, and hypothermia secondary to UTI requiring vasopressor therapy, acute encephalopath, and bradycardia    SIGNIFICANT EVENTS  09/5-Pt admitted to ICU   STUDIES:  CT Head 09/6>>No acute intracranial abnormality.  Generalized atrophy Sinus mucosal disease  HISTORY OF PRESENT ILLNESS:   This is a 61 year old male with a past medical history of hypothyroidism, rheumatoid arthritis, pressure ulcer, morbid obesity, hypertension, COPD, chronic pain, CHF, and BPH.  He presented to [facility] ER 09/6 from [facility] with bradycardia and altered mental status.  In the ER the pt was hypotensive and hypothermic ruling pt in for septic shock, therefore  he received 4.5L of IV fluids, however despite fluid resuscitation he remained hypotensive and levophed drip initiated.  He was subsequently admitted to ICU by hospitalist team for further workup and treatment PCCM consulted.     SUBJECTIVE  Weaning off pressors. Now on 3 mcg of levophed. Occasionally calls out but not able to have a coherent conversation.   VITAL SIGNS: Temp:  [96.3 F (35.7 C)-100.2 F (37.9 C)] 98.2 F (36.8 C) (09/09 0645) Pulse Rate:  [72-111] 98 (09/09 0645) Resp:  [10-23] 11 (09/09 0645) BP: (90-138)/(47-80) 98/51 (09/09 0645) SpO2:  [93 %-100 %] 98 % (09/09 0645) Weight:  [297 lb 9.9 oz (135 kg)] 297 lb 9.9 oz (135 kg) (09/09 0144)  PHYSICAL EXAMINATION: General: acutely ill appearing AA male, NAD Neuro: awake, follows very simple commands and tracks, PERRLA HEENT: supple, no JVD  Cardiovascular: sinus brady, no M/R/G Lungs: crackles and rhonchi throughout, even, non labored; no wheezes or rales  Abdomen: +BS x4, obese soft, non tender, non distended     Musculoskeletal: moves upper extremities, 1+ generalized edema  Skin: intact no rashes or lesions    Recent Labs Lab 08/02/17 1102 08/03/17 0501 08/04/17 0439  NA 143 141 143  K 4.4 4.2 4.1  CL 100* 104 105  CO2 36* 33* 35*  BUN 35* 21* 9  CREATININE 0.65 0.56* 0.39*  GLUCOSE 75 199* 91    Recent Labs Lab 08/02/17 1102 08/03/17 0501 08/04/17 0439  HGB 11.4* 9.7* 9.4*  HCT 34.3* 29.3* 28.3*  WBC 2.2* 6.4 4.7  PLT 32* 32* 27*   No results found.  ASSESSMENT / PLAN: Septic shock with hypotension secondary to UTI  Acute encephalopathy likely secondary to sepsis  Bradycardia likely secondary to beta blocker  Hypothermia  Thrombocytopenia  Hx: COPD, RA, CHF, and Morbid Obesity  P: Continue Supplemental O2 PRN to maintain O2 sats 88% to 92% and/or for dyspnea  Prn CXR Continue Scheduled and prn bronchodilator therapy  Continue levophed gtt to maintain map >65 Hold outpatient hypertensive medications for now  Continuous telemetry monitoring  Trend WBC and monitor fever curve Trend PCT and lactic acid Follow cultures Continue abx  SCD's for VTE prophylaxis, avoid chemical prophylaxis  Prn warming blanket  No family at bedside. Will update when available     Marlissa Emerick S. [facility] ANP-BC Pulmonary and Critical Care Medicine [facility] Pager [phone] or [phone]

## 2017-08-05 NOTE — Progress Notes (Signed)
Patient has been resting most of the shift. Moaned out loud x 2 this shift but unable to talk. Did not have any indications of pain or discomfort. Occasionally nods head when asked question that would need a yes answer. Continue on D10. FBS has maintained this shift. On steroids. Had to turn up Levo once since MAP dropped to 61. No other concerns at this time.

## 2017-08-05 NOTE — Progress Notes (Signed)
[place] at [place] NAME: Jerry [name]    MR#:  [id number]  DATE OF BIRTH:  [birth year]    CHIEF COMPLAINT:   Chief Complaint  Patient presents with  . Bradycardia   Patient is arousable , responds to his name and mumbling,could not make any meaningful conversation REVIEW OF SYSTEMS:   Review of Systems  Unable to perform ROS: Mental status change     DRUG ALLERGIES:   Allergies  Allergen Reactions  . Methadone   . Penicillins     .Has patient had a PCN reaction causing immediate rash, facial/tongue/throat swelling, SOB or lightheadedness with hypotension: Unknown Has patient had a PCN reaction causing severe rash involving mucus membranes or skin necrosis: Unknown Has patient had a PCN reaction that required hospitalization: Unknown Has patient had a PCN reaction occurring within the last 10 years: Unknown If all of the above answers are "NO", then may proceed with Cephalosporin use.   Marland Kitchen Propoxyphene   . Sulfa Antibiotics     VITALS:  Blood pressure (!) 143/79, pulse 88, temperature (!) 96.4 F (35.8 C), resp. rate 11, height 6' (1.829 m), weight 135 kg (297 lb 9.9 oz), SpO2 100 %.  PHYSICAL EXAMINATION:  GENERAL:  61 y.o.-year-old patient lying in the bed with no acute distress. Confused/ EYES: Pupils equal, round, reactive to light.. No scleral icterus. Extraocular muscles intact.  HEENT: Head atraumatic, normocephalic. Oropharynx and nasopharynx clear.  NECK:  Supple, no jugular venous distention. No thyroid enlargement, no tenderness.  LUNGS: Normal breath sounds bilaterally, no wheezing, rales,rhonchi or crepitation. No use of accessory muscles of respiration.  CARDIOVASCULAR: S1, S2 normal. No murmurs, rubs, or gallops.  ABDOMEN: Soft, nontender, nondistended. Bowel sounds present. No organomegaly or mass.  EXTREMITIES: No pedal edema, cyanosis, or clubbing.  NEUROLOGIC: confused, unable to obtain full  neurological exam.  PSYCHIATRIC: confused. SKIN: No obvious rash, lesion, or ulcer.    LABORATORY PANEL:   CBC  Recent Labs Lab 08/05/17 0634  WBC 5.0  HGB 9.6*  HCT 28.6*  PLT 16*   ------------------------------------------------------------------------------------------------------------------  Chemistries   Recent Labs Lab 08/02/17 1102  08/04/17 0439 08/05/17 0634  NA 143  < > 143  --   K 4.4  < > 4.1  --   CL 100*  < > 105  --   CO2 36*  < > 35*  --   GLUCOSE 75  < > 91  --   BUN 35*  < > 9  --   CREATININE 0.65  < > 0.39*  --   CALCIUM 9.3  < > 8.7*  --   MG  --   --   --  2.1  AST 38  --   --   --   ALT 35  --   --   --   ALKPHOS 92  --   --   --   BILITOT 0.3  --   --   --   < > = values in this interval not displayed. ------------------------------------------------------------------------------------------------------------------  Cardiac Enzymes  Recent Labs Lab 08/02/17 1102  TROPONINI <0.03   ------------------------------------------------------------------------------------------------------------------  RADIOLOGY:  No results found.  EKG:   Orders placed or performed during the hospital encounter of 08/02/17  . EKG 12-Lead  . EKG 12-Lead  . EKG 12-Lead  . EKG 12-Lead    ASSESSMENT AND PLAN:   #Septic shock with hypotension secondary to UTI Wean off pressors Levophed urine cultures  are showing Escherichia coli, Enterococcus faecalis. Sensitive to imipenem, Zosyn but resistant to ceftriaxone. Discontinued her Rocephin . Start patient on meropenem  #Acute encephalopathy secondary to septic shock.  Neuro checks, IV antibiotics  #Thrombocytopenia secondary to sepsis no active bleeding. Monitor platelets Platelet count 32,000-27,000--16,000  # bradycardia: Improved.  # hypothermia: Improved.  #.rheumatoid arthritis   Schizophrenia # .prognosis poor.-No family members available at bedside  All the records are reviewed and  case discussed with Care Management/Social Workerr. Management plans discussed with the patient, family and they are in agreement.  CODE STATUS: full  TOTAL TIME TAKING CARE OF THIS  Patient;34 min  POSSIBLE D/C IN ?DAYS, DEPENDING ON CLINICAL CONDITION.   Nicholes Mango M.D on 08/05/2017 at 1:14 PM  Between 7am to 6pm - Pager - [phone]  After 6pm go to [email or web address] - password [email or web address] [facility]  Office  [phone]  CC: Primary care physician; [facility], [facility]   Note: This dictation was prepared with Dragon dictation along with smaller phrase technology. Any transcriptional errors that result from this process are unintentional.

## 2017-08-06 LAB — GLUCOSE, CAPILLARY
GLUCOSE-CAPILLARY: 103 mg/dL — AB (ref 65–99)
GLUCOSE-CAPILLARY: 108 mg/dL — AB (ref 65–99)
GLUCOSE-CAPILLARY: 75 mg/dL (ref 65–99)
GLUCOSE-CAPILLARY: 91 mg/dL (ref 65–99)
GLUCOSE-CAPILLARY: 97 mg/dL (ref 65–99)
GLUCOSE-CAPILLARY: 99 mg/dL (ref 65–99)
Glucose-Capillary: 99 mg/dL (ref 65–99)
Glucose-Capillary: 106 mg/dL — ABNORMAL HIGH (ref 65–99)
Glucose-Capillary: 107 mg/dL — ABNORMAL HIGH (ref 65–99)
Glucose-Capillary: 84 mg/dL (ref 65–99)

## 2017-08-06 LAB — CBC
HEMATOCRIT: 28.6 % — AB (ref 40.0–52.0)
Hemoglobin: 9.5 g/dL — ABNORMAL LOW (ref 13.0–18.0)
MCH: 29.5 pg (ref 26.0–34.0)
MCHC: 33.4 g/dL (ref 32.0–36.0)
MCV: 88.4 fL (ref 80.0–100.0)
PLATELETS: 19 10*3/uL — AB (ref 150–440)
RBC: 3.23 MIL/uL — ABNORMAL LOW (ref 4.40–5.90)
RDW: 17.3 % — AB (ref 11.5–14.5)
WBC: 6.1 10*3/uL (ref 3.8–10.6)

## 2017-08-06 MED ORDER — IPRATROPIUM-ALBUTEROL 0.5-2.5 (3) MG/3ML IN SOLN
3.0000 mL | RESPIRATORY_TRACT | Status: DC | PRN
Start: 2017-08-06 — End: 2017-08-13

## 2017-08-06 MED ORDER — SODIUM CHLORIDE 0.9 % IV SOLN
3.0000 g | Freq: Four times a day (QID) | INTRAVENOUS | Status: DC
  Administered 2017-08-06 – 2017-08-07 (×5): 3 g via INTRAVENOUS
  Filled 2017-08-06 (×9): qty 3

## 2017-08-06 MED ORDER — FLUPHENAZINE HCL 10 MG PO TABS
12.5000 mg | ORAL_TABLET | Freq: Every morning | ORAL | Status: DC
  Administered 2017-08-06 – 2017-08-13 (×8): 12.5 mg via ORAL
  Filled 2017-08-06 (×7): qty 1
  Filled 2017-08-06: qty 3

## 2017-08-06 MED ORDER — FLUPHENAZINE HCL 5 MG PO TABS
15.0000 mg | ORAL_TABLET | Freq: Every day | ORAL | Status: DC
  Administered 2017-08-06 – 2017-08-12 (×7): 15 mg via ORAL
  Filled 2017-08-06 (×8): qty 3

## 2017-08-06 MED ORDER — HYDROCORTISONE NA SUCCINATE PF 100 MG IJ SOLR
50.0000 mg | Freq: Two times a day (BID) | INTRAMUSCULAR | Status: DC
  Administered 2017-08-06 – 2017-08-10 (×8): 50 mg via INTRAVENOUS
  Filled 2017-08-06: qty 2
  Filled 2017-08-06 (×9): qty 1

## 2017-08-06 NOTE — Progress Notes (Signed)
Pt transferred to 1C from ICU. VSS. No signs of pain. Alert to self, follows commands. Pt did not void since foley removal at 0800. Bladder scan showed 357ml. Dr Tressia Miners notified, In&out cath once ordered.

## 2017-08-06 NOTE — Progress Notes (Signed)
Patient transferred to room 120. Report called to [facility] RN on 1C. RN called niece who is POA, [name], and updated her on transfer.   Patient still has not voided since foley catheter removal but bladder scan volume only 321 mL. Condom catheter in place. Blood sugars stable. Patient eating about 50% of each meal and must be fed. Patient with stable oxygen saturation on room air, stable blood pressures, and heart rhythm was normal sinus rhythm today in ICU.

## 2017-08-06 NOTE — Progress Notes (Addendum)
[place] at [place] NAME: Bryen Hinderman    MR#:  [id number]  DATE OF BIRTH:  1956/01/29    CHIEF COMPLAINT:   Chief Complaint  Patient presents with  . Bradycardia   Patient is more awake and alert and answering questions could answer his name and date of birth but stillcould not make  meaningful conversation REVIEW OF SYSTEMS:   Review of Systems  Unable to perform ROS: Psychiatric disorder     DRUG ALLERGIES:   Allergies  Allergen Reactions  . Methadone   . Penicillins     .Has patient had a PCN reaction causing immediate rash, facial/tongue/throat swelling, SOB or lightheadedness with hypotension: Unknown Has patient had a PCN reaction causing severe rash involving mucus membranes or skin necrosis: Unknown Has patient had a PCN reaction that required hospitalization: Unknown Has patient had a PCN reaction occurring within the last 10 years: Unknown If all of the above answers are "NO", then may proceed with Cephalosporin use.   Marland Kitchen Propoxyphene   . Sulfa Antibiotics   . Valproic Acid And Related     Thrombocytopenia    VITALS:  Blood pressure 113/77, pulse 80, temperature 98.1 F (36.7 C), temperature source Oral, resp. rate 17, height 6' (1.829 m), weight (!) 137.2 kg (302 lb 7.5 oz), SpO2 93 %.  PHYSICAL EXAMINATION:  GENERAL:  61 y.o.-year-old patient lying in the bed with no acute distress. Confused/ EYES: Pupils equal, round, reactive to light.. No scleral icterus. Extraocular muscles intact.  HEENT: Head atraumatic, normocephalic. Oropharynx and nasopharynx clear.  NECK:  Supple, no jugular venous distention. No thyroid enlargement, no tenderness.  LUNGS: Normal breath sounds bilaterally, no wheezing, rales,rhonchi or crepitation. No use of accessory muscles of respiration.  CARDIOVASCULAR: S1, S2 normal. No murmurs, rubs, or gallops.  ABDOMEN: Soft, nontender, nondistended. Bowel sounds present. No organomegaly  or mass.  EXTREMITIES: No pedal edema, cyanosis, or clubbing.  NEUROLOGIC: altered, unable to obtain full neurological exam.  PSYCHIATRIC: altered SKIN: No obvious rash, lesion, or ulcer.    LABORATORY PANEL:   CBC  Recent Labs Lab 08/05/17 0634  WBC 5.0  HGB 9.6*  HCT 28.6*  PLT 16*   ------------------------------------------------------------------------------------------------------------------  Chemistries   Recent Labs Lab 08/02/17 1102  08/04/17 0439 08/05/17 0634  NA 143  < > 143  --   K 4.4  < > 4.1  --   CL 100*  < > 105  --   CO2 36*  < > 35*  --   GLUCOSE 75  < > 91  --   BUN 35*  < > 9  --   CREATININE 0.65  < > 0.39*  --   CALCIUM 9.3  < > 8.7*  --   MG  --   --   --  2.1  AST 38  --   --   --   ALT 35  --   --   --   ALKPHOS 92  --   --   --   BILITOT 0.3  --   --   --   < > = values in this interval not displayed. ------------------------------------------------------------------------------------------------------------------  Cardiac Enzymes  Recent Labs Lab 08/02/17 1102  TROPONINI <0.03   ------------------------------------------------------------------------------------------------------------------  RADIOLOGY:  No results found.  EKG:   Orders placed or performed during the hospital encounter of 08/02/17  . EKG 12-Lead  . EKG 12-Lead  . EKG 12-Lead  . EKG 12-Lead  ASSESSMENT AND PLAN:   #Septic shock with hypotension secondary to UTI Weand off pressors Levophed,[place] septic shock anymore urine cultures are showing Escherichia coli, Enterococcus faecalis. Sensitive to imipenem, Zosyn but resistant to ceftriaxone. Discontinued her Rocephin . Started patient on meropenem,which is changed to Unasyn Blood culture with coag-negative staph aureus from one bottle only  #Acute encephalopathy secondary to septic shock.  Neuro checks, IV antibiotics  #Thrombocytopenia secondary to sepsis no active bleeding. Monitor  platelets Platelet count 32,000-27,000--16,000 Check CBC  # bradycardia: Improved.  # hypothermia: Improved.  #.rheumatoid arthritis -Chronic and no exacerbation at this time  Schizophrenia # .prognosis poor.-No family members available   All the records are reviewed and case discussed with Care Management/Social Workerr. Management plans discussed with the patient, family and they are in agreement.  CODE STATUS: full  TOTAL TIME TAKING CARE OF THIS  Patient;34 min  POSSIBLE D/C IN ?DAYS, DEPENDING ON CLINICAL CONDITION.   Nicholes Mango M.D on 08/06/2017 at 12:55 PM  Between 7am to 6pm - Pager - [phone]  After 6pm go to [email or web address] - password [email or web address] [facility]  Office  [phone]  CC: Primary care physician; Housecalls, [facility]   Note: This dictation was prepared with Dragon dictation along with smaller phrase technology. Any transcriptional errors that result from this process are unintentional.

## 2017-08-06 NOTE — Progress Notes (Signed)
No distress Denies pain and shortness of breath  Vitals:   08/06/17 0800 08/06/17 0900 08/06/17 1001 08/06/17 1100  BP: 128/86 128/63 117/60 113/77  Pulse: 86 84 85 80  Resp: 19  16 17   Temp: 97.6 F (36.4 C)   98.1 F (36.7 C)  TempSrc: Oral   Oral  SpO2: 93% 93% 93% 93%  Weight:      Height:        NAD HEENT WNL JVP cannot be assessed Chest clear anteriorly Regular, no murmur Obese, soft, NABS, NT Extremities warm, no edema  BMP Latest Ref Rng & Units 08/04/2017 08/03/2017 08/02/2017  Glucose 65 - 99 mg/dL 91 199(H) 75  BUN 6 - 20 mg/dL 9 21(H) 35(H)  Creatinine 0.61 - 1.24 mg/dL 0.39(L) 0.56(L) 0.65  Sodium 135 - 145 mmol/L 143 141 143  Potassium 3.5 - 5.1 mmol/L 4.1 4.2 4.4  Chloride 101 - 111 mmol/L 105 104 100(L)  CO2 22 - 32 mmol/L 35(H) 33(H) 36(H)  Calcium 8.9 - 10.3 mg/dL 8.7(L) 8.4(L) 9.3   CBC Latest Ref Rng & Units 08/05/2017 08/04/2017 08/03/2017  WBC 3.8 - 10.6 K/uL 5.0 4.7 6.4  Hemoglobin 13.0 - 18.0 g/dL 9.6(L) 9.4(L) 9.7(L)  Hematocrit 40.0 - 52.0 % 28.6(L) 28.3(L) 29.3(L)  Platelets 150 - 440 K/uL 16(LL) 27(LL) 32(L)   CXR: NNF  IMPRESSION: 1) Septic shock due to urinary tract infection Due to Escherichia coli 2) Encephalopathy - appears to be back at his baseline 3) Hypothermia, resolved 4) Thrombocytopenia - last hospitalization, he suffered the same thing. After we stopped the valproic acid, his platelet count returned to normal. I suspect that this is the same phenomenon. I have discontinued the valproic acid and listed this as an "allergy" 5) low cortisol - position and again this hospitalization, his cortisol level seemed inappropriately low. He improved both times with hydrocortisone. I wonder whether he has adrenal insufficiency. This cannot presently be evaluated as he is still on hydrocortisone. 6) poor venous access  PLAN/REC: 1) Change antibiotics to Unasyn - pharmacy to dose for 5 more days 2) Discontinue valproic acid and he should remain off  of this medication (see above) 3) Decrease hydrocortisone to 50 mg every 12 hours. This should be tapered off over the next few days. Once he is fully off of corticosteroids, suggest ACTH stimulation test to assess his HPA axis 4) Will leave triple-lumen catheter in due to very poor peripheral IV access  5) transfer to [place] floor. Begin discharge planning  After transfer, PCCM will sign off. Please call if we can be of further assistance    Merton Border, MD PCCM service Mobile [phone] Pager [phone] 08/06/2017 12:35 PM

## 2017-08-06 NOTE — Progress Notes (Signed)
Pharmacy Antibiotic Note  Jerry [name] is a 61 y.o. male admitted on 08/02/2017 with UTI.  Pharmacy has been consulted for Unasyn dosing.  Plan: Initiate Unasyn 3g q6h for a total of 5 days    Height: 6' (182.9 cm) Weight: (!) 302 lb 7.5 oz (137.2 kg) IBW/kg (Calculated) : 77.6  Temp (24hrs), Avg:96.3 F (35.7 C), Min:95.4 F (35.2 C), Max:97.6 F (36.4 C)   Recent Labs Lab 08/02/17 1102 08/03/17 0501 08/04/17 0439 08/05/17 0634  WBC 2.2* 6.4 4.7 5.0  CREATININE 0.65 0.56* 0.39*  --   LATICACIDVEN 1.0  --   --   --     Estimated Creatinine Clearance: 139.1 mL/min (A) (by C-G formula based on SCr of 0.39 mg/dL (L)).    Allergies  Allergen Reactions  . Methadone   . Penicillins     .Has patient had a PCN reaction causing immediate rash, facial/tongue/throat swelling, SOB or lightheadedness with hypotension: Unknown Has patient had a PCN reaction causing severe rash involving mucus membranes or skin necrosis: Unknown Has patient had a PCN reaction that required hospitalization: Unknown Has patient had a PCN reaction occurring within the last 10 years: Unknown If all of the above answers are "NO", then may proceed with Cephalosporin use.   Marland Kitchen Propoxyphene   . Sulfa Antibiotics   . Valproic Acid And Related     Thrombocytopenia    Antimicrobials this admission: 9/6 Aztreonam >> 9/6 9/6 Levofloxacin >> 9/6 9/6 Vancomycin >> 9/6 9/6 Ceftriaxone >> 9/7 9/8 Meropenem >> 9/10 9/6 Hydrochloroquine >> 9/10 Unasyn >>   Dose adjustments this admission: N/A  Microbiology results: 9/6 BCx: methicillin resistant coagulase negative staph (aerobic), no growth in other 9/6 UCx: >100k E. coli  and E. faecalis  9/6 MRSA PCR: negative  Thank you for allowing pharmacy to be a part of this patient's care.  Durwin Reges, PharmD Student 08/06/2017 11:21 AM

## 2017-08-07 LAB — CBC
HEMATOCRIT: 27.9 % — AB (ref 40.0–52.0)
Hemoglobin: 9.4 g/dL — ABNORMAL LOW (ref 13.0–18.0)
MCH: 29.9 pg (ref 26.0–34.0)
MCHC: 33.7 g/dL (ref 32.0–36.0)
MCV: 88.9 fL (ref 80.0–100.0)
PLATELETS: 22 10*3/uL — AB (ref 150–440)
RBC: 3.13 MIL/uL — AB (ref 4.40–5.90)
RDW: 17.1 % — AB (ref 11.5–14.5)
WBC: 4.8 10*3/uL (ref 3.8–10.6)

## 2017-08-07 LAB — BASIC METABOLIC PANEL
Anion gap: 6 (ref 5–15)
BUN: 17 mg/dL (ref 6–20)
CHLORIDE: 106 mmol/L (ref 101–111)
CO2: 31 mmol/L (ref 22–32)
CREATININE: 0.51 mg/dL — AB (ref 0.61–1.24)
Calcium: 8.6 mg/dL — ABNORMAL LOW (ref 8.9–10.3)
Glucose, Bld: 74 mg/dL (ref 65–99)
POTASSIUM: 3.6 mmol/L (ref 3.5–5.1)
SODIUM: 143 mmol/L (ref 135–145)

## 2017-08-07 LAB — CULTURE, BLOOD (ROUTINE X 2): CULTURE: NO GROWTH

## 2017-08-07 LAB — GLUCOSE, CAPILLARY
GLUCOSE-CAPILLARY: 65 mg/dL (ref 65–99)
GLUCOSE-CAPILLARY: 82 mg/dL (ref 65–99)
GLUCOSE-CAPILLARY: 69 mg/dL (ref 65–99)
GLUCOSE-CAPILLARY: 127 mg/dL — AB (ref 65–99)
Glucose-Capillary: 75 mg/dL (ref 65–99)
Glucose-Capillary: 76 mg/dL (ref 65–99)
Glucose-Capillary: 80 mg/dL (ref 65–99)

## 2017-08-07 MED ORDER — SODIUM CHLORIDE 0.9 % IV SOLN
1.0000 g | Freq: Three times a day (TID) | INTRAVENOUS | Status: DC
  Administered 2017-08-07 – 2017-08-09 (×6): 1 g via INTRAVENOUS
  Filled 2017-08-07 (×10): qty 1

## 2017-08-07 NOTE — Consult Note (Signed)
[facility] Infectious Disease     Reason for Consult:  UTI   Referring Physician: Gouru, A Date of Admission:  08/02/2017   Active Problems:   Septic shock ([place])   Altered mental status   HPI: Jerry [name] is a 61 y.o. male admitted from [facility] with AMS, no UOP and bradycardia as well as hypothermia and hypotension. He has ha hx of bilateral unstageable heel ulcers, schizophrenia and COPD. On admit wbc was 2.2, UA with TNTC WBC.  UCX + ESBL E coli and Enterococcus and bcx with CNS in 1/2 cultures. CXR CT head neg. Initially in ICU but now on floor. Initially treated with aztreonam, ctx and levo and vanco. On IV meropenem from 9/8-9/9. Now on unasyn.  He is listed as allergic to sulfa and pcn.   Past Medical History:  Diagnosis Date  . BPH (benign prostatic hyperplasia)   . CHF (congestive heart failure) ([place])   . Chronic pain   . COPD (chronic obstructive pulmonary disease) ([place])   . Hypertension   . Morbid obesity ([place])   . Pressure ulcer   . Rheumatoid arthritis ([place])   . Schizophrenia ([place])   . Thyroid disease    History reviewed. No pertinent surgical history. Social History  Substance Use Topics  . Smoking status: Never Smoker  . Smokeless tobacco: Never Used  . Alcohol use No   No family history on file.  Allergies:  Allergies  Allergen Reactions  . Methadone   . Penicillins     .Has patient had a PCN reaction causing immediate rash, facial/tongue/throat swelling, SOB or lightheadedness with hypotension: Unknown Has patient had a PCN reaction causing severe rash involving mucus membranes or skin necrosis: Unknown Has patient had a PCN reaction that required hospitalization: Unknown Has patient had a PCN reaction occurring within the last 10 years: Unknown If all of the above answers are "NO", then may proceed with Cephalosporin use.   Marland Kitchen Propoxyphene   . Sulfa Antibiotics   . Valproic Acid And Related     Thrombocytopenia    Current  antibiotics: Antibiotics Given (last 72 hours)    Date/Time Action Medication Dose Rate   08/04/17 1639 New Bag/Given   meropenem (MERREM) 1 g in sodium chloride 0.9 % 100 mL IVPB 1 g 200 mL/hr   08/04/17 2246 New Bag/Given   meropenem (MERREM) 1 g in sodium chloride 0.9 % 100 mL IVPB 1 g 200 mL/hr   08/05/17 0605 New Bag/Given   meropenem (MERREM) 1 g in sodium chloride 0.9 % 100 mL IVPB 1 g 200 mL/hr   08/05/17 1455 New Bag/Given   meropenem (MERREM) 1 g in sodium chloride 0.9 % 100 mL IVPB 1 g 200 mL/hr   08/05/17 2233 New Bag/Given   meropenem (MERREM) 1 g in sodium chloride 0.9 % 100 mL IVPB 1 g 200 mL/hr   08/06/17 0600 New Bag/Given   meropenem (MERREM) 1 g in sodium chloride 0.9 % 100 mL IVPB 1 g 200 mL/hr   08/06/17 1053 Given   hydroxychloroquine (PLAQUENIL) tablet 200 mg 200 mg    08/06/17 1256 New Bag/Given   Ampicillin-Sulbactam (UNASYN) 3 g in sodium chloride 0.9 % 100 mL IVPB 3 g 200 mL/hr   08/06/17 1706 New Bag/Given   Ampicillin-Sulbactam (UNASYN) 3 g in sodium chloride 0.9 % 100 mL IVPB 3 g 200 mL/hr   08/06/17 2238 Given   hydroxychloroquine (PLAQUENIL) tablet 200 mg 200 mg    08/07/17 0053 New Bag/Given  Ampicillin-Sulbactam (UNASYN) 3 g in sodium chloride 0.9 % 100 mL IVPB 3 g 200 mL/hr   08/07/17 0720 New Bag/Given   Ampicillin-Sulbactam (UNASYN) 3 g in sodium chloride 0.9 % 100 mL IVPB 3 g 200 mL/hr   08/07/17 8811 Given   hydroxychloroquine (PLAQUENIL) tablet 200 mg 200 mg    08/07/17 1308 New Bag/Given   Ampicillin-Sulbactam (UNASYN) 3 g in sodium chloride 0.9 % 100 mL IVPB 3 g 200 mL/hr      MEDICATIONS: . benztropine  0.5 mg Oral BID  . dextrose  50 mL Intravenous Once  . docusate sodium  100 mg Oral BID  . fluPHENAZine  12.5 mg Oral q morning - 10a  . fluPHENAZine  15 mg Oral QHS  . hydrocortisone sod succinate (SOLU-CORTEF) inj  50 mg Intravenous Q12H  . hydroxychloroquine  200 mg Oral BID  . levothyroxine  150 mcg Oral QAC breakfast  .  multivitamin with minerals  1 tablet Oral Daily  . protein supplement shake  11 oz Oral TID BM  . tamsulosin  0.4 mg Oral Daily  . traZODone  150 mg Oral QHS  . vitamin C  500 mg Oral BID    Review of Systems - unable to obtain   OBJECTIVE: Temp:  [98 F (36.7 C)-98.7 F (37.1 C)] 98 F (36.7 C) (09/11 0338) Pulse Rate:  [83-93] 83 (09/11 0338) Resp:  [8-22] 17 (09/11 0338) BP: (110-139)/(61-83) 118/61 (09/11 0338) SpO2:  [90 %-100 %] 99 % (09/11 0338) Weight:  [145.7 kg (321 lb 4.8 oz)] 145.7 kg (321 lb 4.8 oz) (09/10 1807) Physical Exam  Constitutional: Morbidly obese, lying in bed. Knows he is in hospital  HENT: anicteric Mouth/Throat: Oropharynx is clear and dry. No oropharyngeal exudate.  Cardiovascular: Normal rate, regular rhythm and normal heart sounds.  Pulmonary/Chest: Effort normal and breath sounds normal. No respiratory distress. He has no wheezes.  Abdominal: Soft. Bowel sounds are normal. Obese He exhibits no distension. There is no tenderness.  Lymphadenopathy: He has no cervical adenopathy.  Neurological: lethargic  Skin: Skin is warm and dry. No rash noted. No erythema. No heel ulcers noted, just dry skin  Psychiatric: flat affect  LABS: Results for orders placed or performed during the hospital encounter of 08/02/17 (from the past 48 hour(s))  Glucose, capillary     Status: Abnormal   Collection Time: 08/05/17  5:47 PM  Result Value Ref Range   Glucose-Capillary 103 (H) 65 - 99 mg/dL  Glucose, capillary     Status: Abnormal   Collection Time: 08/05/17  7:42 PM  Result Value Ref Range   Glucose-Capillary 109 (H) 65 - 99 mg/dL  Glucose, capillary     Status: Abnormal   Collection Time: 08/05/17  8:52 PM  Result Value Ref Range   Glucose-Capillary 122 (H) 65 - 99 mg/dL  Glucose, capillary     Status: Abnormal   Collection Time: 08/05/17 10:07 PM  Result Value Ref Range   Glucose-Capillary 112 (H) 65 - 99 mg/dL  Glucose, capillary     Status: Abnormal    Collection Time: 08/05/17 10:54 PM  Result Value Ref Range   Glucose-Capillary 128 (H) 65 - 99 mg/dL   Comment 1 Notify RN    Comment 2 Document in Chart   Glucose, capillary     Status: Abnormal   Collection Time: 08/06/17 12:02 AM  Result Value Ref Range   Glucose-Capillary 108 (H) 65 - 99 mg/dL   Comment 1 Notify RN  Comment 2 Document in Chart   Glucose, capillary     Status: Abnormal   Collection Time: 08/06/17 12:59 AM  Result Value Ref Range   Glucose-Capillary 103 (H) 65 - 99 mg/dL   Comment 1 Notify RN    Comment 2 Document in Chart   Glucose, capillary     Status: None   Collection Time: 08/06/17  2:03 AM  Result Value Ref Range   Glucose-Capillary 99 65 - 99 mg/dL   Comment 1 Notify RN    Comment 2 Document in Chart   Glucose, capillary     Status: Abnormal   Collection Time: 08/06/17  2:55 AM  Result Value Ref Range   Glucose-Capillary 106 (H) 65 - 99 mg/dL   Comment 1 Notify RN    Comment 2 Document in Chart   Glucose, capillary     Status: None   Collection Time: 08/06/17  4:09 AM  Result Value Ref Range   Glucose-Capillary 97 65 - 99 mg/dL   Comment 1 Notify RN    Comment 2 Document in Chart   Glucose, capillary     Status: None   Collection Time: 08/06/17  5:08 AM  Result Value Ref Range   Glucose-Capillary 99 65 - 99 mg/dL   Comment 1 Notify RN    Comment 2 Document in Chart   Glucose, capillary     Status: Abnormal   Collection Time: 08/06/17  7:26 AM  Result Value Ref Range   Glucose-Capillary 107 (H) 65 - 99 mg/dL  Glucose, capillary     Status: None   Collection Time: 08/06/17 10:58 AM  Result Value Ref Range   Glucose-Capillary 91 65 - 99 mg/dL  CBC     Status: Abnormal   Collection Time: 08/06/17  1:24 PM  Result Value Ref Range   WBC 6.1 3.8 - 10.6 K/uL   RBC 3.23 (L) 4.40 - 5.90 MIL/uL   Hemoglobin 9.5 (L) 13.0 - 18.0 g/dL   HCT 28.6 (L) 40.0 - 52.0 %   MCV 88.4 80.0 - 100.0 fL   MCH 29.5 26.0 - 34.0 pg   MCHC 33.4 32.0 - 36.0  g/dL   RDW 17.3 (H) 11.5 - 14.5 %   Platelets 19 (LL) 150 - 440 K/uL    Comment: CRITICAL RESULT CALLED TO, READ BACK BY AND VERIFIED WITH: Adalberto Ill RN AT 3474 08/06/17. MSS   Glucose, capillary     Status: None   Collection Time: 08/06/17  4:10 PM  Result Value Ref Range   Glucose-Capillary 84 65 - 99 mg/dL  Glucose, capillary     Status: None   Collection Time: 08/06/17  8:54 PM  Result Value Ref Range   Glucose-Capillary 75 65 - 99 mg/dL  Glucose, capillary     Status: None   Collection Time: 08/07/17 12:37 AM  Result Value Ref Range   Glucose-Capillary 75 65 - 99 mg/dL  Glucose, capillary     Status: None   Collection Time: 08/07/17  3:43 AM  Result Value Ref Range   Glucose-Capillary 65 65 - 99 mg/dL  Glucose, capillary     Status: None   Collection Time: 08/07/17  4:33 AM  Result Value Ref Range   Glucose-Capillary 82 65 - 99 mg/dL  Basic metabolic panel     Status: Abnormal   Collection Time: 08/07/17  7:20 AM  Result Value Ref Range   Sodium 143 135 - 145 mmol/L   Potassium 3.6 3.5 - 5.1  mmol/L   Chloride 106 101 - 111 mmol/L   CO2 31 22 - 32 mmol/L   Glucose, Bld 74 65 - 99 mg/dL   BUN 17 6 - 20 mg/dL   Creatinine, Ser 0.51 (L) 0.61 - 1.24 mg/dL   Calcium 8.6 (L) 8.9 - 10.3 mg/dL   GFR calc non Af Amer >60 >60 mL/min   GFR calc Af Amer >60 >60 mL/min    Comment: (NOTE) The eGFR has been calculated using the CKD EPI equation. This calculation has not been validated in all clinical situations. eGFR's persistently <60 mL/min signify possible Chronic Kidney Disease.    Anion gap 6 5 - 15  CBC     Status: Abnormal   Collection Time: 08/07/17  7:20 AM  Result Value Ref Range   WBC 4.8 3.8 - 10.6 K/uL   RBC 3.13 (L) 4.40 - 5.90 MIL/uL   Hemoglobin 9.4 (L) 13.0 - 18.0 g/dL   HCT 27.9 (L) 40.0 - 52.0 %   MCV 88.9 80.0 - 100.0 fL   MCH 29.9 26.0 - 34.0 pg   MCHC 33.7 32.0 - 36.0 g/dL   RDW 17.1 (H) 11.5 - 14.5 %   Platelets 22 (LL) 150 - 440 K/uL    Comment:  RESULT REPEATED AND VERIFIED CRITICAL VALUE NOTED.  VALUE IS CONSISTENT WITH PREVIOUSLY REPORTED AND CALLED VALUE.   Glucose, capillary     Status: None   Collection Time: 08/07/17  7:58 AM  Result Value Ref Range   Glucose-Capillary 69 65 - 99 mg/dL  Glucose, capillary     Status: None   Collection Time: 08/07/17 12:03 PM  Result Value Ref Range   Glucose-Capillary 80 65 - 99 mg/dL   No components found for: ESR, C REACTIVE PROTEIN MICRO: Recent Results (from the past 720 hour(s))  Blood Culture (routine x 2)     Status: Abnormal   Collection Time: 08/02/17 11:04 AM  Result Value Ref Range Status   Specimen Description BLOOD BLOOD RIGHT FOREARM  Final   Special Requests   Final    BOTTLES DRAWN AEROBIC AND ANAEROBIC Blood Culture adequate volume   Culture  Setup Time   Final    AEROBIC BOTTLE ONLY GRAM POSITIVE COCCI CRITICAL RESULT CALLED TO, READ BACK BY AND VERIFIED WITH: MATT [place] ON 08/04/17 AT 0612 [facility]    Culture (A)  Final    STAPHYLOCOCCUS SPECIES (COAGULASE NEGATIVE) THE SIGNIFICANCE OF ISOLATING THIS ORGANISM FROM A SINGLE SET OF BLOOD CULTURES WHEN MULTIPLE SETS ARE DRAWN IS UNCERTAIN. PLEASE NOTIFY THE MICROBIOLOGY DEPARTMENT WITHIN ONE WEEK IF SPECIATION AND SENSITIVITIES ARE REQUIRED. Performed at [facility], [place] [address], [place], [place] [address]    Report Status 08/05/2017 FINAL  Final  Urine culture     Status: Abnormal   Collection Time: 08/02/17 11:04 AM  Result Value Ref Range Status   Specimen Description URINE, RANDOM  Final   Special Requests NONE  Final   Culture (A)  Final    >=100,000 COLONIES/mL ESCHERICHIA COLI Confirmed Extended Spectrum Beta-Lactamase Producer (ESBL) >=100,000 COLONIES/mL ENTEROCOCCUS FAECALIS    Report Status 08/04/2017 FINAL  Final   Organism ID, Bacteria ESCHERICHIA COLI (A)  Final   Organism ID, Bacteria ENTEROCOCCUS FAECALIS (A)  Final      Susceptibility   Escherichia coli - MIC*    AMPICILLIN >=32  RESISTANT Resistant     CEFAZOLIN >=64 RESISTANT Resistant     CEFTRIAXONE RESISTANT Resistant  CIPROFLOXACIN >=4 RESISTANT Resistant     GENTAMICIN <=1 SENSITIVE Sensitive     IMIPENEM <=0.25 SENSITIVE Sensitive     NITROFURANTOIN <=16 SENSITIVE Sensitive     TRIMETH/SULFA <=20 SENSITIVE Sensitive     AMPICILLIN/SULBACTAM 4 SENSITIVE Sensitive     PIP/TAZO <=4 SENSITIVE Sensitive     Extended ESBL POSITIVE Resistant     * >=100,000 COLONIES/mL ESCHERICHIA COLI   Enterococcus faecalis - MIC*    AMPICILLIN <=2 SENSITIVE Sensitive     LEVOFLOXACIN 0.5 SENSITIVE Sensitive     NITROFURANTOIN <=16 SENSITIVE Sensitive     VANCOMYCIN 1 SENSITIVE Sensitive     * >=100,000 COLONIES/mL ENTEROCOCCUS FAECALIS  Blood Culture ID Panel (Reflexed)     Status: Abnormal   Collection Time: 08/02/17 11:04 AM  Result Value Ref Range Status   Enterococcus species NOT DETECTED NOT DETECTED Final   Listeria monocytogenes NOT DETECTED NOT DETECTED Final   Staphylococcus species DETECTED (A) NOT DETECTED Final    Comment: Methicillin (oxacillin) resistant coagulase negative staphylococcus. Possible blood culture contaminant (unless isolated from more than one blood culture draw or clinical case suggests pathogenicity). No antibiotic treatment is indicated for blood  culture contaminants. CRITICAL RESULT CALLED TO, READ BACK BY AND VERIFIED WITH: MATT MCBANE ON 08/04/17 AT 0612 [facility]    Staphylococcus aureus NOT DETECTED NOT DETECTED Final   Methicillin resistance DETECTED (A) NOT DETECTED Final    Comment: CRITICAL RESULT CALLED TO, READ BACK BY AND VERIFIED WITH: MATT MCBANE ON 08/04/17 AT 0612 [place]    Streptococcus species NOT DETECTED NOT DETECTED Final   Streptococcus agalactiae NOT DETECTED NOT DETECTED Final   Streptococcus pneumoniae NOT DETECTED NOT DETECTED Final   Streptococcus pyogenes NOT DETECTED NOT DETECTED Final   Acinetobacter baumannii NOT DETECTED NOT DETECTED Final   Enterobacteriaceae  species NOT DETECTED NOT DETECTED Final   Enterobacter cloacae complex NOT DETECTED NOT DETECTED Final   Escherichia coli NOT DETECTED NOT DETECTED Final   Klebsiella oxytoca NOT DETECTED NOT DETECTED Final   Klebsiella pneumoniae NOT DETECTED NOT DETECTED Final   Proteus species NOT DETECTED NOT DETECTED Final   Serratia marcescens NOT DETECTED NOT DETECTED Final   Haemophilus influenzae NOT DETECTED NOT DETECTED Final   Neisseria meningitidis NOT DETECTED NOT DETECTED Final   Pseudomonas aeruginosa NOT DETECTED NOT DETECTED Final   Candida albicans NOT DETECTED NOT DETECTED Final   Candida glabrata NOT DETECTED NOT DETECTED Final   Candida krusei NOT DETECTED NOT DETECTED Final   Candida parapsilosis NOT DETECTED NOT DETECTED Final   Candida tropicalis NOT DETECTED NOT DETECTED Final  Blood Culture (routine x 2)     Status: None   Collection Time: 08/02/17 11:09 AM  Result Value Ref Range Status   Specimen Description BLOOD BLOOD RIGHT ARM  Final   Special Requests   Final    BOTTLES DRAWN AEROBIC AND ANAEROBIC Blood Culture results may not be optimal due to an inadequate volume of blood received in culture bottles   Culture NO GROWTH 5 DAYS  Final   Report Status 08/07/2017 FINAL  Final  MRSA PCR Screening     Status: None   Collection Time: 08/02/17  2:48 PM  Result Value Ref Range Status   MRSA by PCR NEGATIVE NEGATIVE Final    Comment:        The GeneXpert MRSA Assay (FDA approved for NASAL specimens only), is one component of a comprehensive MRSA colonization surveillance program. It is not intended to diagnose MRSA  infection nor to guide or monitor treatment for MRSA infections.     IMAGING: Ct Head Wo Contrast  Result Date: 08/02/2017 CLINICAL DATA:  Altered level of consciousness EXAM: CT HEAD WITHOUT CONTRAST TECHNIQUE: Contiguous axial images were obtained from the base of the skull through the vertex without intravenous contrast. COMPARISON:  06/28/2017  FINDINGS: Brain: Moderate atrophy unchanged. Negative for acute infarct. Negative for hemorrhage or mass lesion. Vascular: Negative for hyperdense vessel Skull: Negative Sinuses/Orbits: Air-fluid level in the sphenoid sinus. Mild mucosal edema in the maxillary sinuses bilaterally. Bilateral exophthalmos. No orbital mass. Other: None IMPRESSION: No acute intracranial abnormality.  Generalized atrophy Sinus mucosal disease Electronically Signed   By: Franchot Gallo M.D.   On: 08/02/2017 11:48   Dg Chest Port 1 View  Result Date: 08/03/2017 CLINICAL DATA:  Central line placement. EXAM: PORTABLE CHEST 1 VIEW COMPARISON:  Chest radiograph yesterday FINDINGS: Tip of the right central line in the distal SVC. No evidence of large pneumothorax, low lung volumes limit assessment. Low lung volumes persist, similar to prior exam. Stable cardiomegaly and pulmonary vascular congestion. Again seen left lung base scarring. Mild right infrahilar atelectasis. Otherwise no new abnormality. IMPRESSION: Tip of the right central line in the distal SVC. No large pneumothorax. Stable cardiomegaly, vascular congestion and left basilar atelectasis/scarring. Mild right infrahilar atelectasis. Electronically Signed   By: Jeb Levering M.D.   On: 08/03/2017 03:21   Dg Chest Port 1 View  Result Date: 08/02/2017 CLINICAL DATA:  Bradycardia. EXAM: PORTABLE CHEST 1 VIEW COMPARISON:  Radiograph of June 29, 2017. FINDINGS: Stable cardiomegaly. Atherosclerosis of thoracic aorta is noted. No pneumothorax is noted. Mild central pulmonary vascular congestion is noted. Stable left basilar atelectasis or scarring is noted. Bony thorax is unremarkable. IMPRESSION: Stable cardiomegaly with central pulmonary vascular congestion. Aortic atherosclerosis. Stable left basilar atelectasis or scarring. Electronically Signed   By: Marijo Conception, M.D.   On: 08/02/2017 11:36    Assessment:   Jerry [name] is a 61 y.o. male with morbid obesity,  schizophrenia, COPD admitted with AMS and found to have ESBL E coli and enterococcus UTI and coag neg staph bacteremia.  Clinically improving but seems to be in general poor health and resides at [facility].  He is at high risk of recurrent UTI.  Recommendations UTI - ESBL E coli and Enterococcus.  Standard treatment in this case wound be to continue meropenem as even though appears in-vitro sensitive to unasyn and zosyn there are high rates of treatment failure with these regimens. (if he were not allergic to bactrim this would be an option) I would change to meropenem for a total 7 day course from start of meropenem on 9/8 - stop date 9/15. However if ready for dc can send on fosfomycin 3 gm at dc then repeat q 2 days until stop date.    CNS in 1/2 bcx is contaminant and no need to treat  Thank you very much for allowing me to participate in the care of this patient. Please call with questions.   Cheral Marker. Ola Spurr, MD

## 2017-08-07 NOTE — Plan of Care (Signed)
Problem: Education: Goal: Knowledge of [facility] Education information/materials will improve Outcome: Progressing VSS, free of falls during shift.  Denies pain.  BG 65, received 4 oz grape juice, improved to 82.  No other needs overnight.  Bed in low position, bed alarm on, q2h repositioning.  Call bell within reach, [place].

## 2017-08-07 NOTE — Progress Notes (Signed)
[place] [facility] [place] NAME: Jerry [name]    MR#:  [id number]  DATE OF BIRTH:  [birth year]    CHIEF COMPLAINT:   Chief Complaint  Patient presents with  . Bradycardia   Patient is more awake and alert and answering questions could answer his name and date of birth but stillcould not make  meaningful conversation,' says that he cut them off '  REVIEW OF SYSTEMS:   Review of Systems  Unable to perform ROS: Psychiatric disorder     DRUG ALLERGIES:   Allergies  Allergen Reactions  . Methadone   . Penicillins     .Has patient had a PCN reaction causing immediate rash, facial/tongue/throat swelling, SOB or lightheadedness with hypotension: Unknown Has patient had a PCN reaction causing severe rash involving mucus membranes or skin necrosis: Unknown Has patient had a PCN reaction that required hospitalization: Unknown Has patient had a PCN reaction occurring within the last 10 years: Unknown If all of the above answers are "NO", then may proceed with Cephalosporin use.   Marland Kitchen Propoxyphene   . Sulfa Antibiotics   . Valproic Acid And Related     Thrombocytopenia    VITALS:  Blood pressure 118/61, pulse 83, temperature 98 F (36.7 C), temperature source Oral, resp. rate 17, height 6\' 2"  (1.88 m), weight (!) 145.7 kg (321 lb 4.8 oz), SpO2 99 %.  PHYSICAL EXAMINATION:  GENERAL:  61 y.o.-year-old patient lying in the bed with no acute distress. Confused/ EYES: Pupils equal, round, reactive to light.. No scleral icterus. Extraocular muscles intact.  HEENT: Head atraumatic, normocephalic. Oropharynx and nasopharynx clear.  NECK:  Supple, no jugular venous distention. No thyroid enlargement, no tenderness.  LUNGS: Normal breath sounds bilaterally, no wheezing, rales,rhonchi or crepitation. No use of accessory muscles of respiration.  CARDIOVASCULAR: S1, S2 normal. No murmurs, rubs, or gallops.  ABDOMEN: Soft, nontender, nondistended. Bowel  sounds present. No organomegaly or mass.  EXTREMITIES: No pedal edema, cyanosis, or clubbing.  NEUROLOGIC: altered, unable to obtain full neurological exam.  PSYCHIATRIC: altered SKIN: No obvious rash, lesion, or ulcer.    LABORATORY PANEL:   CBC  Recent Labs Lab 08/07/17 0720  WBC 4.8  HGB 9.4*  HCT 27.9*  PLT 22*   ------------------------------------------------------------------------------------------------------------------  Chemistries   Recent Labs Lab 08/02/17 1102  08/05/17 0634 08/07/17 0720  NA 143  < >  --  143  K 4.4  < >  --  3.6  CL 100*  < >  --  106  CO2 36*  < >  --  31  GLUCOSE 75  < >  --  74  BUN 35*  < >  --  17  CREATININE 0.65  < >  --  0.51*  CALCIUM 9.3  < >  --  8.6*  MG  --   --  2.1  --   AST 38  --   --   --   ALT 35  --   --   --   ALKPHOS 92  --   --   --   BILITOT 0.3  --   --   --   < > = values in this interval not displayed. ------------------------------------------------------------------------------------------------------------------  Cardiac Enzymes  Recent Labs Lab 08/02/17 1102  TROPONINI <0.03   ------------------------------------------------------------------------------------------------------------------  RADIOLOGY:  No results found.  EKG:   Orders placed or performed during the hospital encounter of 08/02/17  . EKG 12-Lead  . EKG  12-Lead  . EKG 12-Lead  . EKG 12-Lead    ASSESSMENT AND PLAN:   #Septic shock with hypotension secondary to UTI [place]and off pressors Levophed,[place] septic shock anymore urine cultures are showing Escherichia coli, Enterococcus faecalis. Sensitive to imipenem, Zosyn but resistant to ceftriaxone. Discontinued her Rocephin . Started patient on meropenemPer ID recommendations. The patient can be given 1 dose of fosfomycin prior to discharge and one more dose of fosfomycin 72 hours after the first dose Blood culture with coag-negative staph aureus from one bottle only Patient  needs cosyntropin stimulation test after discontinuing steroids  #Acute encephalopathy secondary to septic shock.  Neuro checks, IV antibiotics meropenem  #Thrombocytopenia secondary to sepsis no active bleeding/Valproic acid adverse effect  Monitor platelets Platelet count 32,000-27,000--16,000-22,000 , improving after stopping valproic acid  Check CBC Discontinued valproic acid  # bradycardia: Improved.  # hypothermia: Improved.  #.rheumatoid arthritis -Chronic and no exacerbation at this time  Schizophrenia-consult psychiatry # .prognosis poor.-No family members available   All the records are reviewed and case discussed with Care Management/Social Workerr. Management plans discussed with the patient, family and they are in agreement.  CODE STATUS: full  TOTAL TIME TAKING CARE OF THIS  Patient;34 min  POSSIBLE D/C IN ?DAYS, DEPENDING ON CLINICAL CONDITION.   Nicholes Mango M.D on 08/07/2017 at 3:56 PM  Between 7am to 6pm - Pager - [phone]  After 6pm go to [email or web address] - password [email or web address] [facility]  Office  [phone]  CC: Primary care physician; [facility]   Note: This dictation was prepared with Dragon dictation along with smaller phrase technology. Any transcriptional errors that result from this process are unintentional.

## 2017-08-07 NOTE — Consult Note (Signed)
Pharmacy Antibiotic Note  Jerry [name] is a 61 y.o. male admitted on 08/02/2017 with UTI.  Pharmacy has been consulted for meropenem dosing.  Plan: meropenem 1g q 8 hours  Height: 6\' 2"  (188 cm) Weight: (!) 321 lb 4.8 oz (145.7 kg) IBW/kg (Calculated) : 82.2  Temp (24hrs), Avg:98.3 F (36.8 C), Min:98 F (36.7 C), Max:98.7 F (37.1 C)   Recent Labs Lab 08/02/17 1102 08/03/17 0501 08/04/17 0439 08/05/17 0634 08/06/17 1324 08/07/17 0720  WBC 2.2* 6.4 4.7 5.0 6.1 4.8  CREATININE 0.65 0.56* 0.39*  --   --  0.51*  LATICACIDVEN 1.0  --   --   --   --   --     Estimated Creatinine Clearance: 147.6 mL/min (A) (by C-G formula based on SCr of 0.51 mg/dL (L)).    Allergies  Allergen Reactions  . Methadone   . Penicillins     .Has patient had a PCN reaction causing immediate rash, facial/tongue/throat swelling, SOB or lightheadedness with hypotension: Unknown Has patient had a PCN reaction causing severe rash involving mucus membranes or skin necrosis: Unknown Has patient had a PCN reaction that required hospitalization: Unknown Has patient had a PCN reaction occurring within the last 10 years: Unknown If all of the above answers are "NO", then may proceed with Cephalosporin use.   Marland Kitchen Propoxyphene   . Sulfa Antibiotics   . Valproic Acid And Related     Thrombocytopenia    Antimicrobials this admission: ceftriaxone 9/6 >> 9/7 meropenem 9/8 >> 9/10, 9/11 unasyn 9/10>>9/11  Dose adjustments this admission:   Microbiology results: 9/6 BCx: 1/2 coag neg staph 9/6 UCx: ESBL, enterococcus    Thank you for allowing pharmacy to be a part of this patient's care.  Ramond Dial, Pharm.D, BCPS Clinical Pharmacist  08/07/2017 3:13 PM

## 2017-08-08 LAB — CBC
HCT: 26.8 % — ABNORMAL LOW (ref 40.0–52.0)
Hemoglobin: 9.1 g/dL — ABNORMAL LOW (ref 13.0–18.0)
MCH: 30.7 pg (ref 26.0–34.0)
MCHC: 34.1 g/dL (ref 32.0–36.0)
MCV: 90 fL (ref 80.0–100.0)
PLATELETS: 25 10*3/uL — AB (ref 150–440)
RBC: 2.97 MIL/uL — AB (ref 4.40–5.90)
RDW: 17.4 % — AB (ref 11.5–14.5)
WBC: 4.4 10*3/uL (ref 3.8–10.6)

## 2017-08-08 LAB — TSH: TSH: 15.271 u[IU]/mL — ABNORMAL HIGH (ref 0.350–4.500)

## 2017-08-08 LAB — GLUCOSE, CAPILLARY
GLUCOSE-CAPILLARY: 67 mg/dL (ref 65–99)
GLUCOSE-CAPILLARY: 88 mg/dL (ref 65–99)
GLUCOSE-CAPILLARY: 89 mg/dL (ref 65–99)
Glucose-Capillary: 84 mg/dL (ref 65–99)
Glucose-Capillary: 84 mg/dL (ref 65–99)
Glucose-Capillary: 92 mg/dL (ref 65–99)
Glucose-Capillary: 99 mg/dL (ref 65–99)

## 2017-08-08 MED ORDER — BISACODYL 10 MG RE SUPP
10.0000 mg | Freq: Every day | RECTAL | Status: DC
  Filled 2017-08-08 (×2): qty 1

## 2017-08-08 NOTE — Progress Notes (Signed)
Per [facility] liaison patient is a long term care medicaid patient at [facility] and can return when stable.   [employer], LCSW [phone]

## 2017-08-08 NOTE — Progress Notes (Signed)
Nutrition Follow-up  DOCUMENTATION CODES:   Morbid obesity  INTERVENTION:  Continue Premier Protein po TID, each supplement provides 160 kcal and 30 grams of protein.  Continue daily multivitamin with minerals.  Encouraged adequate intake at meals.  Patient may benefit from more aggressive bowel movement. He has not had a documented bowel movement during admission (now 6 days), and it is unclear when his last bowel movement was prior to admission.  NUTRITION DIAGNOSIS:   Increased nutrient needs (protein) related to wound healing (hx of stage I to b/l gluteals and sacrum) as evidenced by estimated needs.  Ongoing.  GOAL:   Patient will meet greater than or equal to 90% of their needs  Progressing with calories, met with protein.  MONITOR:   PO intake, Supplement acceptance, Labs, Weight trends, Skin, I & O's  REASON FOR ASSESSMENT:   Malnutrition Screening Tool    ASSESSMENT:   61 year old male with PMHx of CHF, HTN, hypothyroidism, COPD, chronic pain, RA, BPH who presented from [facility] with bradycardia and AMS found to have septic shock with hypotension secondary to UTI, acute encephalopathy likely secondary to sepsis, hypothermia.  -Patient weaned off pressors and transferred to floor.  Spoke with patient at bedside. He was much more alert than during initial assessment and was able to provide appropriate answers to questions. Patient reports his appetite is pretty good. He feels it is almost back to baseline. He is finishing about 50% of two meals daily. He reports he ate lunch and dinner yesterday, but so far has not had much of breakfast. He has only had his milk off his breakfast tray. Likes Premier Protein and would like to continue drinking them.  Patient has not had a documented bowel movement during admission. Now day 6 of no known bowel movement (unsure when last BM was PTA).  Intake: In the past 24 hrs patient has had approximately 1475 kcal (60%  minimum estimated kcal needs) and 134 grams of protein (96% minimum estimated protein needs).  Medications reviewed and include: Colace, levothyroxine, MVI daily, vitamin C 500 mg BID, D10 @ 50 ml/hr (120 grams dextrose, 408 kcal daily), meropenem.  Labs reviewed: CBG 67-127.  Weight trend: 143.2 kg on 9/12 (+4.3 kg from admission)  Discussed with RN. Patient requires assistance with meals.  Diet Order:  DIET DYS 3 Room service appropriate? Yes with Assist; Fluid consistency: Thin  Skin:  Reviewed, no issues  Last BM:  Unknown  Height:   Ht Readings from Last 1 Encounters:  08/06/17 '6\' 2"'  (1.88 m)    Weight:   Wt Readings from Last 1 Encounters:  08/08/17 (!) 315 lb 12.8 oz (143.2 kg)    Ideal Body Weight:  80.9 kg  BMI:  Body mass index is 40.55 kg/m.  Estimated Nutritional Needs:   Kcal:  1771-1657 (MSJ x 1.1-1.2)  Protein:  140-165 grams (1-1.2 grams/kg)  Fluid:  2.4-2.8 L/day (30-35 ml/kg IBW)  EDUCATION NEEDS:   No education needs identified at this time  Willey Blade, MS, RD, LDN Pager: [phone] After Hours Pager: [phone]

## 2017-08-08 NOTE — Progress Notes (Signed)
[place] INFECTIOUS DISEASE PROGRESS NOTE Date of Admission:  08/02/2017     ID: Jerry [name] is a 61 y.o. male with ESBL E coli UTI. Active Problems:   Septic shock (HCC)   Altered mental status  Subjective: A little more alert but not able to provide history. No fevers.   ROS  Unable to obtain Medications:  Antibiotics Given (last 72 hours)    Date/Time Action Medication Dose Rate   08/05/17 1455 New Bag/Given   meropenem (MERREM) 1 g in sodium chloride 0.9 % 100 mL IVPB 1 g 200 mL/hr   08/05/17 2233 New Bag/Given   meropenem (MERREM) 1 g in sodium chloride 0.9 % 100 mL IVPB 1 g 200 mL/hr   08/06/17 0600 New Bag/Given   meropenem (MERREM) 1 g in sodium chloride 0.9 % 100 mL IVPB 1 g 200 mL/hr   08/06/17 1053 Given   hydroxychloroquine (PLAQUENIL) tablet 200 mg 200 mg    08/06/17 1256 New Bag/Given   Ampicillin-Sulbactam (UNASYN) 3 g in sodium chloride 0.9 % 100 mL IVPB 3 g 200 mL/hr   08/06/17 1706 New Bag/Given   Ampicillin-Sulbactam (UNASYN) 3 g in sodium chloride 0.9 % 100 mL IVPB 3 g 200 mL/hr   08/06/17 2238 Given   hydroxychloroquine (PLAQUENIL) tablet 200 mg 200 mg    08/07/17 0053 New Bag/Given   Ampicillin-Sulbactam (UNASYN) 3 g in sodium chloride 0.9 % 100 mL IVPB 3 g 200 mL/hr   08/07/17 0720 New Bag/Given   Ampicillin-Sulbactam (UNASYN) 3 g in sodium chloride 0.9 % 100 mL IVPB 3 g 200 mL/hr   08/07/17 0942 Given   hydroxychloroquine (PLAQUENIL) tablet 200 mg 200 mg    08/07/17 1308 New Bag/Given   Ampicillin-Sulbactam (UNASYN) 3 g in sodium chloride 0.9 % 100 mL IVPB 3 g 200 mL/hr   08/07/17 1815 New Bag/Given   meropenem (MERREM) 1 g in sodium chloride 0.9 % 100 mL IVPB 1 g 200 mL/hr   08/07/17 2116 Given   hydroxychloroquine (PLAQUENIL) tablet 200 mg 200 mg    08/07/17 2336 New Bag/Given  [medication not available]   meropenem (MERREM) 1 g in sodium chloride 0.9 % 100 mL IVPB 1 g 200 mL/hr   08/08/17 0643 New Bag/Given   meropenem (MERREM) 1 g in  sodium chloride 0.9 % 100 mL IVPB 1 g 200 mL/hr   08/08/17 4496 Given   hydroxychloroquine (PLAQUENIL) tablet 200 mg 200 mg      . benztropine  0.5 mg Oral BID  . bisacodyl  10 mg Rectal Daily  . dextrose  50 mL Intravenous Once  . docusate sodium  100 mg Oral BID  . fluPHENAZine  12.5 mg Oral q morning - 10a  . fluPHENAZine  15 mg Oral QHS  . hydrocortisone sod succinate (SOLU-CORTEF) inj  50 mg Intravenous Q12H  . hydroxychloroquine  200 mg Oral BID  . levothyroxine  150 mcg Oral QAC breakfast  . multivitamin with minerals  1 tablet Oral Daily  . protein supplement shake  11 oz Oral TID BM  . tamsulosin  0.4 mg Oral Daily  . traZODone  150 mg Oral QHS  . vitamin C  500 mg Oral BID    Objective: Vital signs in last 24 hours: Temp:  [97.7 F (36.5 C)-98.6 F (37 C)] 98 F (36.7 C) (09/12 0835) Pulse Rate:  [75-92] 79 (09/12 0835) Resp:  [18-20] 20 (09/12 0835) BP: (110-142)/(51-74) 110/51 (09/12 0835) SpO2:  [95 %-99 %] 99 % (  09/12 0835) Weight:  [143.2 kg (315 lb 12.8 oz)] 143.2 kg (315 lb 12.8 oz) (09/12 0500) Constitutional: Morbidly obese, lying in bed. Knows he is in hospital  HENT: anicteric Mouth/Throat: Oropharynx is clear and dry. No oropharyngeal exudate.  Cardiovascular: Normal rate, regular rhythm and normal heart sounds.  Pulmonary/Chest: Effort normal and breath sounds normal. No respiratory distress. He has no wheezes.  Abdominal: Soft. Bowel sounds are normal. Obese He exhibits no distension. There is no tenderness.  Lymphadenopathy: He has no cervical adenopathy.  Neurological: lethargic  Skin: Skin is warm and dry. No rash noted. No erythema. No heel ulcers noted, just dry skin  Psychiatric: flat affect  Lab Results  Recent Labs  08/07/17 0720 08/08/17 0533  WBC 4.8 4.4  HGB 9.4* 9.1*  HCT 27.9* 26.8*  NA 143  --   K 3.6  --   CL 106  --   CO2 31  --   BUN 17  --   CREATININE 0.51*  --     Microbiology: Results for orders placed or  performed during the hospital encounter of 08/02/17  Blood Culture (routine x 2)     Status: Abnormal   Collection Time: 08/02/17 11:04 AM  Result Value Ref Range Status   Specimen Description BLOOD BLOOD RIGHT FOREARM  Final   Special Requests   Final    BOTTLES DRAWN AEROBIC AND ANAEROBIC Blood Culture adequate volume   Culture  Setup Time   Final    AEROBIC BOTTLE ONLY GRAM POSITIVE COCCI CRITICAL RESULT CALLED TO, READ BACK BY AND VERIFIED WITH: MATT MCBANE ON 08/04/17 AT 0612 [facility]    Culture (A)  Final    STAPHYLOCOCCUS SPECIES (COAGULASE NEGATIVE) THE SIGNIFICANCE OF ISOLATING THIS ORGANISM FROM A SINGLE SET OF BLOOD CULTURES WHEN MULTIPLE SETS ARE DRAWN IS UNCERTAIN. PLEASE NOTIFY THE MICROBIOLOGY DEPARTMENT WITHIN ONE WEEK IF SPECIATION AND SENSITIVITIES ARE REQUIRED. Performed at [facility], [place] [address], [place], [place] [address]    Report Status 08/05/2017 FINAL  Final  Urine culture     Status: Abnormal   Collection Time: 08/02/17 11:04 AM  Result Value Ref Range Status   Specimen Description URINE, RANDOM  Final   Special Requests NONE  Final   Culture (A)  Final    >=100,000 COLONIES/mL ESCHERICHIA COLI Confirmed Extended Spectrum Beta-Lactamase Producer (ESBL) >=100,000 COLONIES/mL ENTEROCOCCUS FAECALIS    Report Status 08/04/2017 FINAL  Final   Organism ID, Bacteria ESCHERICHIA COLI (A)  Final   Organism ID, Bacteria ENTEROCOCCUS FAECALIS (A)  Final      Susceptibility   Escherichia coli - MIC*    AMPICILLIN >=32 RESISTANT Resistant     CEFAZOLIN >=64 RESISTANT Resistant     CEFTRIAXONE RESISTANT Resistant     CIPROFLOXACIN >=4 RESISTANT Resistant     GENTAMICIN <=1 SENSITIVE Sensitive     IMIPENEM <=0.25 SENSITIVE Sensitive     NITROFURANTOIN <=16 SENSITIVE Sensitive     TRIMETH/SULFA <=20 SENSITIVE Sensitive     AMPICILLIN/SULBACTAM 4 SENSITIVE Sensitive     PIP/TAZO <=4 SENSITIVE Sensitive     Extended ESBL POSITIVE Resistant     * >=100,000  COLONIES/mL ESCHERICHIA COLI   Enterococcus faecalis - MIC*    AMPICILLIN <=2 SENSITIVE Sensitive     LEVOFLOXACIN 0.5 SENSITIVE Sensitive     NITROFURANTOIN <=16 SENSITIVE Sensitive     VANCOMYCIN 1 SENSITIVE Sensitive     * >=100,000 COLONIES/mL ENTEROCOCCUS FAECALIS  Blood Culture ID Panel (Reflexed)  Status: Abnormal   Collection Time: 08/02/17 11:04 AM  Result Value Ref Range Status   Enterococcus species NOT DETECTED NOT DETECTED Final   Listeria monocytogenes NOT DETECTED NOT DETECTED Final   Staphylococcus species DETECTED (A) NOT DETECTED Final    Comment: Methicillin (oxacillin) resistant coagulase negative staphylococcus. Possible blood culture contaminant (unless isolated from more than one blood culture draw or clinical case suggests pathogenicity). No antibiotic treatment is indicated for blood  culture contaminants. CRITICAL RESULT CALLED TO, READ BACK BY AND VERIFIED WITH: MATT MCBANE ON 08/04/17 AT 0612 [facility]    Staphylococcus aureus NOT DETECTED NOT DETECTED Final   Methicillin resistance DETECTED (A) NOT DETECTED Final    Comment: CRITICAL RESULT CALLED TO, READ BACK BY AND VERIFIED WITH: MATT MCBANE ON 08/04/17 AT 0612 [place]    Streptococcus species NOT DETECTED NOT DETECTED Final   Streptococcus agalactiae NOT DETECTED NOT DETECTED Final   Streptococcus pneumoniae NOT DETECTED NOT DETECTED Final   Streptococcus pyogenes NOT DETECTED NOT DETECTED Final   Acinetobacter baumannii NOT DETECTED NOT DETECTED Final   Enterobacteriaceae species NOT DETECTED NOT DETECTED Final   Enterobacter cloacae complex NOT DETECTED NOT DETECTED Final   Escherichia coli NOT DETECTED NOT DETECTED Final   Klebsiella oxytoca NOT DETECTED NOT DETECTED Final   Klebsiella pneumoniae NOT DETECTED NOT DETECTED Final   Proteus species NOT DETECTED NOT DETECTED Final   Serratia marcescens NOT DETECTED NOT DETECTED Final   Haemophilus influenzae NOT DETECTED NOT DETECTED Final   Neisseria  meningitidis NOT DETECTED NOT DETECTED Final   Pseudomonas aeruginosa NOT DETECTED NOT DETECTED Final   Candida albicans NOT DETECTED NOT DETECTED Final   Candida glabrata NOT DETECTED NOT DETECTED Final   Candida krusei NOT DETECTED NOT DETECTED Final   Candida parapsilosis NOT DETECTED NOT DETECTED Final   Candida tropicalis NOT DETECTED NOT DETECTED Final  Blood Culture (routine x 2)     Status: None   Collection Time: 08/02/17 11:09 AM  Result Value Ref Range Status   Specimen Description BLOOD BLOOD RIGHT ARM  Final   Special Requests   Final    BOTTLES DRAWN AEROBIC AND ANAEROBIC Blood Culture results may not be optimal due to an inadequate volume of blood received in culture bottles   Culture NO GROWTH 5 DAYS  Final   Report Status 08/07/2017 FINAL  Final  MRSA PCR Screening     Status: None   Collection Time: 08/02/17  2:48 PM  Result Value Ref Range Status   MRSA by PCR NEGATIVE NEGATIVE Final    Comment:        The GeneXpert MRSA Assay (FDA approved for NASAL specimens only), is one component of a comprehensive MRSA colonization surveillance program. It is not intended to diagnose MRSA infection nor to guide or monitor treatment for MRSA infections.    Studies/Results: No results found.  Assessment/Plan: Damani Kelemen is a 61 y.o. male with morbid obesity, schizophrenia, COPD admitted with AMS and found to have ESBL E coli and enterococcus UTI and coag neg staph bacteremia.  Clinically improving but seems to be in general poor health and resides at [facility].  He is at high risk of recurrent UTI.  Recommendations UTI - ESBL E coli and Enterococcus.  Standard treatment in this case wound be to continue meropenem as even though appears in-vitro sensitive to unasyn and zosyn there are high rates of treatment failure with these regimens. (if he were not allergic to bactrim this would be an option)  I would change to meropenem for a total 7 day course from start of meropenem on  9/8 - stop date 9/15. However if ready for dc can send on fosfomycin 3 gm at dc then repeat q 2 days until stop date.    CNS in 1/2 bcx is contaminant and no need to treat Thank you very much for the consult. Will follow with you.  Jerry [name]   08/08/2017, 2:04 PM

## 2017-08-08 NOTE — Progress Notes (Signed)
[facility] [place] NAME: Jerry [name]    MR#:  [id number]  DATE OF BIRTH:  08-15-1956    CHIEF COMPLAINT:   Chief Complaint  Patient presents with  . Bradycardia   Patient is more awake and alert and answering questions could answer his name and date of birth but still could not make  meaningful conversation,' says he lives in [place] '  REVIEW OF SYSTEMS:   Review of Systems  Unable to perform ROS: Psychiatric disorder     DRUG ALLERGIES:   Allergies  Allergen Reactions  . Methadone   . Penicillins     .Has patient had a PCN reaction causing immediate rash, facial/tongue/throat swelling, SOB or lightheadedness with hypotension: Unknown Has patient had a PCN reaction causing severe rash involving mucus membranes or skin necrosis: Unknown Has patient had a PCN reaction that required hospitalization: Unknown Has patient had a PCN reaction occurring within the last 10 years: Unknown If all of the above answers are "NO", then may proceed with Cephalosporin use.   Marland Kitchen Propoxyphene   . Sulfa Antibiotics   . Valproic Acid And Related     Thrombocytopenia    VITALS:  Blood pressure (!) 110/51, pulse 79, temperature 98 F (36.7 C), temperature source Oral, resp. rate 20, height 6\' 2"  (1.88 m), weight (!) 143.2 kg (315 lb 12.8 oz), SpO2 99 %.  PHYSICAL EXAMINATION:  GENERAL:  61 y.o.-year-old patient lying in the bed with no acute distress. Confused/ EYES: Pupils equal, round, reactive to light.. No scleral icterus. Extraocular muscles intact.  HEENT: Head atraumatic, normocephalic. Oropharynx and nasopharynx clear.  NECK:  Supple, no jugular venous distention. No thyroid enlargement, no tenderness.  LUNGS: Normal breath sounds bilaterally, no wheezing, rales,rhonchi or crepitation. No use of accessory muscles of respiration.  CARDIOVASCULAR: S1, S2 normal. No murmurs, rubs, or gallops.  ABDOMEN: Soft, nontender, nondistended.  Bowel sounds present. No organomegaly or mass.  EXTREMITIES: No pedal edema, cyanosis, or clubbing. Dark skin and atrophied toes. NEUROLOGIC: altered, unable to obtain full neurological exam. Moves limbs very slowly on commands, but able to lift against gravity. PSYCHIATRIC: altered- confused/ slow. SKIN: No obvious rash, lesion, or ulcer.    LABORATORY PANEL:   CBC  Recent Labs Lab 08/08/17 0533  WBC 4.4  HGB 9.1*  HCT 26.8*  PLT 25*   ------------------------------------------------------------------------------------------------------------------  Chemistries   Recent Labs Lab 08/02/17 1102  08/05/17 0634 08/07/17 0720  NA 143  < >  --  143  K 4.4  < >  --  3.6  CL 100*  < >  --  106  CO2 36*  < >  --  31  GLUCOSE 75  < >  --  74  BUN 35*  < >  --  17  CREATININE 0.65  < >  --  0.51*  CALCIUM 9.3  < >  --  8.6*  MG  --   --  2.1  --   AST 38  --   --   --   ALT 35  --   --   --   ALKPHOS 92  --   --   --   BILITOT 0.3  --   --   --   < > = values in this interval not displayed. ------------------------------------------------------------------------------------------------------------------  Cardiac Enzymes  Recent Labs Lab 08/02/17 1102  TROPONINI <0.03   ------------------------------------------------------------------------------------------------------------------  RADIOLOGY:  No results found.  EKG:  Orders placed or performed during the hospital encounter of 08/02/17  . EKG 12-Lead  . EKG 12-Lead  . EKG 12-Lead  . EKG 12-Lead    ASSESSMENT AND PLAN:   #Septic shock with hypotension secondary to UTI Weand off pressors Levophed,Norton septic shock anymore urine cultures are showing Escherichia coli, Enterococcus faecalis. Sensitive to imipenem, Zosyn but resistant to ceftriaxone. Discontinued her Rocephin . Started patient on meropenemPer ID recommendations. The patient can be given 1 dose of fosfomycin prior to discharge and one more dose  of fosfomycin 72 hours after the first dose Blood culture with coag-negative staph aureus from one bottle only Patient needs cosyntropin stimulation test after discontinuing steroids Check TSH.  #Acute encephalopathy secondary to septic shock.  Neuro checks, IV antibiotics meropenem  #Thrombocytopenia secondary to sepsis no active bleeding/Valproic acid adverse effect  Monitor platelets Platelet count 32,000-27,000--16,000-22,000 ,25000 improving after stopping valproic acid  Check CBC Discontinued valproic acid  # bradycardia: Improved.  # hypothermia: Improved.  # rheumatoid arthritis -Chronic and no exacerbation at this time  Schizophrenia-consult psychiatry # .prognosis poor.-No family members available   # hypoglycemia    On IV steroids BID, also on D10 50 ml/hr.     Called palliative care consult.  All the records are reviewed and case discussed with Care Management/Social Workerr. Management plans discussed with the patient, family and they are in agreement.  CODE STATUS: full  TOTAL TIME TAKING CARE OF THIS  Patient;34 min  POSSIBLE D/C IN ?DAYS, DEPENDING ON CLINICAL CONDITION.   Vaughan Basta M.D on 08/08/2017 at 9:18 AM  Between 7am to 6pm - Pager - [phone]  After 6pm go to [email or web address] - password [email or web address] [facility]  Office  [phone]  CC: Primary care physician; [facility]   Note: This dictation was prepared with Dragon dictation along with smaller phrase technology. Any transcriptional errors that result from this process are unintentional.

## 2017-08-08 NOTE — Progress Notes (Signed)
No charge note:   Palliative consult received.   Meeting scheduled via telephone with patient's legal guardian [name] for tomorrow at 1230.  Mariana Kaufman, AGNP-C Palliative Medicine  Please call Palliative Medicine team phone with any questions [phone]. For individual providers please see AMION.

## 2017-08-09 LAB — GLUCOSE, CAPILLARY
GLUCOSE-CAPILLARY: 96 mg/dL (ref 65–99)
GLUCOSE-CAPILLARY: 84 mg/dL (ref 65–99)
GLUCOSE-CAPILLARY: 96 mg/dL (ref 65–99)
Glucose-Capillary: 72 mg/dL (ref 65–99)
Glucose-Capillary: 86 mg/dL (ref 65–99)
Glucose-Capillary: 88 mg/dL (ref 65–99)

## 2017-08-09 LAB — BASIC METABOLIC PANEL
Anion gap: 5 (ref 5–15)
BUN: 9 mg/dL (ref 6–20)
CHLORIDE: 107 mmol/L (ref 101–111)
CO2: 30 mmol/L (ref 22–32)
CREATININE: 0.42 mg/dL — AB (ref 0.61–1.24)
Calcium: 8.1 mg/dL — ABNORMAL LOW (ref 8.9–10.3)
GFR calc non Af Amer: >60 mL/min (ref >60)
Glucose, Bld: 74 mg/dL (ref 65–99)
POTASSIUM: 3.4 mmol/L — AB (ref 3.5–5.1)
SODIUM: 142 mmol/L (ref 135–145)

## 2017-08-09 LAB — CBC
HCT: 26.1 % — ABNORMAL LOW (ref 40.0–52.0)
HEMOGLOBIN: 8.6 g/dL — AB (ref 13.0–18.0)
MCH: 29.2 pg (ref 26.0–34.0)
MCHC: 32.9 g/dL (ref 32.0–36.0)
MCV: 88.9 fL (ref 80.0–100.0)
PLATELETS: 31 10*3/uL — AB (ref 150–440)
RBC: 2.94 MIL/uL — AB (ref 4.40–5.90)
RDW: 17.3 % — ABNORMAL HIGH (ref 11.5–14.5)
WBC: 4.7 10*3/uL (ref 3.8–10.6)

## 2017-08-09 LAB — MAGNESIUM: Magnesium: 2 mg/dL (ref 1.7–2.4)

## 2017-08-09 MED ORDER — FOSFOMYCIN TROMETHAMINE 3 G PO PACK
3.0000 g | PACK | Freq: Once | ORAL | Status: AC
  Administered 2017-08-09: 15:00:00 3 g via ORAL
  Filled 2017-08-09: qty 3

## 2017-08-09 MED ORDER — LEVOTHYROXINE SODIUM 100 MCG PO TABS
200.0000 ug | ORAL_TABLET | Freq: Every day | ORAL | Status: DC
  Administered 2017-08-10 – 2017-08-13 (×4): 200 ug via ORAL
  Filled 2017-08-09 (×4): qty 2

## 2017-08-09 MED ORDER — POTASSIUM CHLORIDE CRYS ER 20 MEQ PO TBCR
20.0000 meq | EXTENDED_RELEASE_TABLET | Freq: Two times a day (BID) | ORAL | Status: DC
  Administered 2017-08-09 – 2017-08-13 (×9): 20 meq via ORAL
  Filled 2017-08-09 (×9): qty 1

## 2017-08-09 MED ORDER — SODIUM CHLORIDE 0.9% FLUSH
10.0000 mL | INTRAVENOUS | Status: DC | PRN
  Administered 2017-08-12: 20 mL
  Filled 2017-08-09: qty 40

## 2017-08-09 NOTE — Consult Note (Signed)
Consultation Note Date: 08/09/2017   Patient Name: Jerry [name]  DOB: Oct 18, 1956  MRN: [id number]  Age / Sex: 61 y.o., male  PCP: [facility] Referring Physician: Vaughan Basta, *  Reason for Consultation: Establishing goals of care  HPI/Patient Profile: 61 y.o. male  with past medical history of CHF, rheumatoid arthritis, schizophrenia, septicemia due to pneumonia admitted on 08/02/2017 with sepsis UTI. During this admission patient required intubation. He has been extubated, but has been hypolglycemic, requiring D5 fluids to maintain blood glucose levels. He has dysphagia, but is eating approx 50% of meals on dysphagia diet. Clinical Assessment and Goals of Care:  Examined patient at bedside. Sitting up in bed. Pleasant, alert, but not oriented. I asked him where he lived and he told me "with my sister who I brought back to life. I'm like Jesus."  He also tells me about people taking his credit cards.   I have reviewed medical records including EPIC notes, labs and imaging, assessed the patient and then had a telephone conference with patient's niece [name] to discuss diagnosis prognosis, [place], EOL wishes, disposition and options.  I introduced Palliative Medicine as specialized medical care for people living with serious illness. It focuses on providing relief from the symptoms and stress of a serious illness. The goal is to improve quality of life for both the patient and the family.  Consult was brief as patient's niece was unexpectedly time limited.    We discussed their current illness and what it means in the larger context of their on-going co-morbidities.  Although patient is 12, he is physically much older due to living in a nursing home, being functionally limited, not walking, and this being patient's second hospitalization for sepsis this year requiring intubation. We discussed  that often this can be the beginning of an overall decline in a patient's status and the importance of advanced healthcare planning.   Primary Decision Maker LEGAL GUARDIAN - [name]    SUMMARY OF RECOMMENDATIONS -Plan followup phone call with patient's niece on 9/14 at 23 -Delusions and confusion likely manifestation of schizophrenia and intubation and hospitalization- recommend psych consult to adjust medications or expect will return to baseline with time and return to familiar environment -Palliative outpatient services will benefit patient -Continue full scope of care  -PT eval for mobility recommendations   Code Status/Advance Care Planning:  Full code  Palliative Prophylaxis:   Aspiration  Additional Recommendations (Limitations, Scope, Preferences):  Full Scope Treatment   Prognosis:    Unable to determine  Discharge Planning: To Be Determined plan is to recommend SNF with palliative services  Primary Diagnoses: Present on Admission: . Septic shock ([place])   I have reviewed the medical record, interviewed the patient and family, and examined the patient. The following aspects are pertinent.  Past Medical History:  Diagnosis Date  . BPH (benign prostatic hyperplasia)   . CHF (congestive heart failure) ([place])   . Chronic pain   . COPD (chronic obstructive pulmonary disease) ([place])   . Hypertension   .  Morbid obesity ([place])   . Pressure ulcer   . Rheumatoid arthritis ([place])   . Schizophrenia ([place])   . Thyroid disease    Social History   Social History  . Marital status: Single    Spouse name: N/A  . Number of children: N/A  . Years of education: N/A   Social History Main Topics  . Smoking status: Never Smoker  . Smokeless tobacco: Never Used  . Alcohol use No  . Drug use: No  . Sexual activity: Not Asked   Other Topics Concern  . None   Social History Narrative  . None   No family history on file. Scheduled Meds: . benztropine  0.5 mg Oral  BID  . bisacodyl  10 mg Rectal Daily  . dextrose  50 mL Intravenous Once  . docusate sodium  100 mg Oral BID  . fluPHENAZine  12.5 mg Oral q morning - 10a  . fluPHENAZine  15 mg Oral QHS  . hydrocortisone sod succinate (SOLU-CORTEF) inj  50 mg Intravenous Q12H  . hydroxychloroquine  200 mg Oral BID  . [START ON 08/10/2017] levothyroxine  200 mcg Oral QAC breakfast  . multivitamin with minerals  1 tablet Oral Daily  . potassium chloride  20 mEq Oral BID  . protein supplement shake  11 oz Oral TID BM  . tamsulosin  0.4 mg Oral Daily  . traZODone  150 mg Oral QHS  . vitamin C  500 mg Oral BID   Continuous Infusions: . dextrose 50 mL/hr at 08/09/17 1503   PRN Meds:.acetaminophen **OR** acetaminophen, albuterol, ipratropium-albuterol, [DISCONTINUED] ondansetron **OR** ondansetron (ZOFRAN) IV, oxyCODONE-acetaminophen, senna-docusate, sodium chloride flush Medications Prior to Admission:  Prior to Admission medications   Medication Sig Start Date End Date Taking? Authorizing Provider  albuterol (PROVENTIL HFA;VENTOLIN HFA) 108 (90 Base) MCG/ACT inhaler Inhale 1 puff into the lungs every 3 (three) hours as needed for wheezing or shortness of breath.   Yes [provider]  amLODipine (NORVASC) 10 MG tablet Take 10 mg by mouth daily.   Yes [provider]  benztropine (COGENTIN) 0.5 MG tablet Take 0.5 mg by mouth 2 (two) times daily.   Yes [provider]  carvedilol (COREG) 25 MG tablet Take 25 mg by mouth 2 (two) times daily with a meal.   Yes [provider]  clonazePAM (KLONOPIN) 1 MG tablet Take 1 tablet (1 mg total) by mouth at bedtime. 07/02/17  Yes Max Sane, MD  cloNIDine (CATAPRES - DOSED IN MG/24 HR) 0.3 mg/24hr patch Place 0.3 mg onto the skin once a week. Tuesday   Yes [provider]  divalproex (DEPAKOTE SPRINKLE) 125 MG capsule Take 375 mg by mouth 3 (three) times daily.   Yes [provider]  docusate sodium (COLACE) 100 MG  capsule Take 100 mg by mouth 2 (two) times daily.   Yes [provider]  fluPHENAZine (PROLIXIN) 2.5 MG tablet Take 5 tablets (12.5 mg total) by mouth daily. Take 12.5 mg by mouth in the morning and 15 mg by mouth at bedtime. 07/02/17  Yes Max Sane, MD  hydroxychloroquine (PLAQUENIL) 200 MG tablet Take 200 mg by mouth 2 (two) times daily.   Yes [provider]  levothyroxine (SYNTHROID, LEVOTHROID) 150 MCG tablet Take 150 mcg by mouth daily before breakfast.   Yes [provider]  methocarbamol (ROBAXIN) 750 MG tablet Take 750 mg by mouth at bedtime.   Yes [provider]  Multiple Vitamins-Minerals (MULTIVITAMIN WITH MINERALS) tablet Take  1 tablet by mouth daily.   Yes [provider]  Polyethylene Glycol 3350 (MIRALAX PO) Take 17 g by mouth daily.   Yes [provider]  potassium chloride (MICRO-K) 10 MEQ CR capsule Take 10 mEq by mouth daily.   Yes [provider]  tamsulosin (FLOMAX) 0.4 MG CAPS capsule Take 0.4 mg by mouth daily.   Yes [provider]  torsemide (DEMADEX) 10 MG tablet Take 10 mg by mouth daily.   Yes [provider]  traZODone (DESYREL) 150 MG tablet Take 150 mg by mouth at bedtime.   Yes [provider]  vitamin C (ASCORBIC ACID) 250 MG tablet Take 500 mg by mouth 2 (two) times daily.   Yes [provider]   Allergies  Allergen Reactions  . Methadone   . Penicillins     .Has patient had a PCN reaction causing immediate rash, facial/tongue/throat swelling, SOB or lightheadedness with hypotension: Unknown Has patient had a PCN reaction causing severe rash involving mucus membranes or skin necrosis: Unknown Has patient had a PCN reaction that required hospitalization: Unknown Has patient had a PCN reaction occurring within the last 10 years: Unknown If all of the above answers are "NO", then may proceed with Cephalosporin use.   Marland Kitchen Propoxyphene   . Sulfa Antibiotics   .  Valproic Acid And Related     Thrombocytopenia   Review of Systems  Unable to perform ROS: Mental status change    Physical Exam  Constitutional: He appears well-developed and well-nourished.  Cardiovascular: Normal rate and regular rhythm.   Pulmonary/Chest: Effort normal and breath sounds normal.  Neurological: He is alert.  Pleasantly confused, paranoid, has delusions  Nursing note and vitals reviewed.   Vital Signs: BP 136/76 (BP Location: Left Arm)   Pulse 78   Temp 98.6 F (37 C) (Oral)   Resp 20   Ht 6\' 2"  (1.88 m)   Wt (!) 144.8 kg (319 lb 4 oz)   SpO2 98%   BMI 40.99 kg/m  Pain Assessment: No/denies pain POSS *See Group Information*: 1-Acceptable,Awake and alert Pain Score: 0-No pain   SpO2: SpO2: 98 % O2 Device:SpO2: 98 % O2 Flow Rate: .O2 Flow Rate (L/min): 2 L/min  IO: Intake/output summary:  Intake/Output Summary (Last 24 hours) at 08/09/17 2011 Last data filed at 08/09/17 1503  Gross per 24 hour  Intake          1748.67 ml  Output                0 ml  Net          1748.67 ml    LBM: Last BM Date:  (Last BM unknown) Baseline Weight: Weight: (!) 138.9 kg (306 lb 3.5 oz) Most recent weight: Weight: (!) 144.8 kg (319 lb 4 oz)     Palliative Assessment/Data: PPS: 30%   Flowsheet Rows     Most Recent Value  Intake Tab  Referral Department  Hospitalist  Unit at Time of Referral  Oncology Unit  Palliative Care Primary Diagnosis  Sepsis/Infectious Disease  Date Notified  08/08/17  Palliative Care Type  New Palliative care  Reason for referral  Clarify Goals of Care  Date of Admission  08/02/17  # of days IP prior to Palliative referral  6  Clinical Assessment  Psychosocial & Spiritual Assessment  Palliative Care Outcomes      Thank you for this consult. Palliative medicine will continue to follow and assist as needed.  Time In:1200 Time Out: 1250 Time Total: 50 minutes Greater than 50%  of this time was spent counseling and coordinating  care related to the above assessment and plan.  Signed by: Mariana Kaufman, AGNP-C Palliative Medicine    Please contact Palliative Medicine Team phone at [phone] for questions and concerns.  For individual provider: See Shea Evans

## 2017-08-09 NOTE — Plan of Care (Signed)
Problem: Education: Goal: Knowledge of [facility] Education information/materials will improve Outcome: Progressing VSS, free of falls during shift.  Denies pain.  No needs overnight.  Q2h repositioning for skin integrity.  Bed in low position, call bell within reach.  WCTM.

## 2017-08-09 NOTE — Progress Notes (Signed)
VSS. Denies pain. Free from falls during shift. Bed in low position, bed alarm on, call bell in reach, q2h repositioning. No needs this shift.

## 2017-08-09 NOTE — Progress Notes (Signed)
[facility] [place] NAME: Jerry [name]    MR#:  [id number]  DATE OF BIRTH:  1956/10/10    CHIEF COMPLAINT:   Chief Complaint  Patient presents with  . Bradycardia   Patient is more awake and alert and answering questions could answer his name and date of birth but still could not make  meaningful conversation.  REVIEW OF SYSTEMS:   Review of Systems  Unable to perform ROS: Psychiatric disorder     DRUG ALLERGIES:   Allergies  Allergen Reactions  . Methadone   . Penicillins     .Has patient had a PCN reaction causing immediate rash, facial/tongue/throat swelling, SOB or lightheadedness with hypotension: Unknown Has patient had a PCN reaction causing severe rash involving mucus membranes or skin necrosis: Unknown Has patient had a PCN reaction that required hospitalization: Unknown Has patient had a PCN reaction occurring within the last 10 years: Unknown If all of the above answers are "NO", then may proceed with Cephalosporin use.   Marland Kitchen Propoxyphene   . Sulfa Antibiotics   . Valproic Acid And Related     Thrombocytopenia    VITALS:  Blood pressure 136/76, pulse 78, temperature 98.6 F (37 C), temperature source Oral, resp. rate 20, height 6\' 2"  (1.88 m), weight (!) 144.8 kg (319 lb 4 oz), SpO2 98 %.  PHYSICAL EXAMINATION:  GENERAL:  61 y.o.-year-old patient lying in the bed with no acute distress. Confused EYES: Pupils equal, round, reactive to light.. No scleral icterus. Extraocular muscles intact.  HEENT: Head atraumatic, normocephalic. Oropharynx and nasopharynx clear.  NECK:  Supple, no jugular venous distention. No thyroid enlargement, no tenderness.  LUNGS: Normal breath sounds bilaterally, no wheezing, rales,rhonchi or crepitation. No use of accessory muscles of respiration.  CARDIOVASCULAR: S1, S2 normal. No murmurs, rubs, or gallops.  ABDOMEN: Soft, nontender, nondistended. Bowel sounds present. No organomegaly  or mass.  EXTREMITIES: No pedal edema, cyanosis, or clubbing. Dark skin and atrophied toes. NEUROLOGIC: altered, unable to obtain full neurological exam. Moves limbs very slowly on commands, but able to lift against gravity. PSYCHIATRIC: altered- confused/ slow. SKIN: No obvious rash, lesion, or ulcer.    LABORATORY PANEL:   CBC  Recent Labs Lab 08/09/17 0426  WBC 4.7  HGB 8.6*  HCT 26.1*  PLT 31*   ------------------------------------------------------------------------------------------------------------------  Chemistries   Recent Labs Lab 08/09/17 0426  NA 142  K 3.4*  CL 107  CO2 30  GLUCOSE 74  BUN 9  CREATININE 0.42*  CALCIUM 8.1*  MG 2.0   ------------------------------------------------------------------------------------------------------------------  Cardiac Enzymes No results for input(s): TROPONINI in the last 168 hours. ------------------------------------------------------------------------------------------------------------------  RADIOLOGY:  No results found.  EKG:   Orders placed or performed during the hospital encounter of 08/02/17  . EKG 12-Lead  . EKG 12-Lead  . EKG 12-Lead  . EKG 12-Lead    ASSESSMENT AND PLAN:   #Septic shock with hypotension secondary to UTI Weand off pressors Levophed,[place] septic shock anymore urine cultures are showing Escherichia coli, Enterococcus faecalis. Sensitive to imipenem, Zosyn but resistant to ceftriaxone. Discontinued her Rocephin . Started patient on meropenemPer ID recommendations. The patient can be given 1 dose of fosfomycin prior to discharge and one more dose of fosfomycin 72 hours after the first dose- stop date 08/11/17. Blood culture with coag-negative staph aureus from one bottle only. Patient needs cosyntropin stimulation test after discontinuing steroids Checked TSH- high.  #Acute encephalopathy secondary to septic shock.  Neuro checks, IV  antibiotics meropenem  #Thrombocytopenia  secondary to sepsis no active bleeding/Valproic acid adverse effect  Monitor platelets Platelet count 32,000 -16,000-22,000 ,25000- 31000 improving after stopping valproic acid  Check CBC daily. Discontinued valproic acid  # Hypothyroidism   Increase levothyroxine dose today.  # bradycardia: Improved.  # hypothermia: Improved.  # rheumatoid arthritis -Chronic and no exacerbation at this time  Schizophrenia-consult psychiatry # .prognosis poor.-No family members available   # hypoglycemia    On IV steroids BID, also on D10 50 ml/hr.   Likely due to hypothyroidism    Cont levothyroxine with higher dose.     Called palliative care consult.  All the records are reviewed and case discussed with Care Management/Social Workerr. Management plans discussed with the patient, family and they are in agreement.  CODE STATUS: full  TOTAL TIME TAKING CARE OF THIS  Patient; 34 min  POSSIBLE D/C IN ?DAYS, DEPENDING ON CLINICAL CONDITION.   Vaughan Basta M.D on 08/09/2017 at 4:20 PM  Between 7am to 6pm - Pager - [phone]  After 6pm go to [email or web address] - password [email or web address] [facility]  Office  [phone]  CC: Primary care physician; [facility]   Note: This dictation was prepared with Dragon dictation along with smaller phrase technology. Any transcriptional errors that result from this process are unintentional.

## 2017-08-10 DIAGNOSIS — R4182 Altered mental status, unspecified: Secondary | ICD-10-CM

## 2017-08-10 DIAGNOSIS — I509 Heart failure, unspecified: Secondary | ICD-10-CM

## 2017-08-10 DIAGNOSIS — Z515 Encounter for palliative care: Secondary | ICD-10-CM

## 2017-08-10 DIAGNOSIS — A419 Sepsis, unspecified organism: Secondary | ICD-10-CM

## 2017-08-10 DIAGNOSIS — F209 Schizophrenia, unspecified: Secondary | ICD-10-CM

## 2017-08-10 DIAGNOSIS — Z7189 Other specified counseling: Secondary | ICD-10-CM

## 2017-08-10 LAB — GLUCOSE, CAPILLARY
GLUCOSE-CAPILLARY: 73 mg/dL (ref 65–99)
GLUCOSE-CAPILLARY: 103 mg/dL — AB (ref 65–99)
Glucose-Capillary: 76 mg/dL (ref 65–99)
Glucose-Capillary: 108 mg/dL — ABNORMAL HIGH (ref 65–99)
Glucose-Capillary: 125 mg/dL — ABNORMAL HIGH (ref 65–99)

## 2017-08-10 MED ORDER — PREDNISONE 50 MG PO TABS
50.0000 mg | ORAL_TABLET | Freq: Every day | ORAL | Status: DC
  Administered 2017-08-10 – 2017-08-13 (×4): 50 mg via ORAL
  Filled 2017-08-10 (×4): qty 1

## 2017-08-10 MED ORDER — MEROPENEM 1 G IV SOLR
1.0000 g | Freq: Three times a day (TID) | INTRAVENOUS | Status: DC
  Administered 2017-08-10 – 2017-08-13 (×9): 1 g via INTRAVENOUS
  Filled 2017-08-10 (×12): qty 1

## 2017-08-10 MED ORDER — BISACODYL 10 MG RE SUPP
10.0000 mg | Freq: Every day | RECTAL | Status: DC
  Administered 2017-08-10 – 2017-08-12 (×2): 10 mg via RECTAL
  Filled 2017-08-10 (×3): qty 1

## 2017-08-10 NOTE — Progress Notes (Signed)
Physical Therapy Treatment Patient Details Name: Jerry [name] MRN: [id number] DOB: Nov 23, 1956 Today's Date: 08/10/2017    History of Present Illness Admitted on 9/6 for sepsis due to UTI, complaints of bradycarida and altered mental status. PMH of hypothyroidism, RA, pressure ulcers, obesity, HTN, COPD, CHF, schizophrenia    PT Comments    Pt is a pleasant 61 year old male who was admitted for sepsis. Pt able to perform bed mobility with total assist, specifically reaching arms to side to roll. Pt unable to perform transfers/amb. Pt is at functional baseline of being bed bound. Pt demonstrates deficits with UE/LE strength, ROM, functional mobility, and presents with knee and hand contractures. Pt appears willing to participate in PT, will attempt PT trial to see if function improves for pressure relief and increased strength for bed mobility. Recommend return to LTCF upon DC from acute hospitalization. Will continue to progress.    Follow Up Recommendations   (return to LTC)     Equipment Recommendations  None recommended by PT    Recommendations for Other Services       Precautions / Restrictions Precautions Precautions: Fall Restrictions Weight Bearing Restrictions: No    Mobility  Bed Mobility Overal bed mobility: Needs Assistance Bed Mobility: Rolling Rolling: Max assist         General bed mobility comments: Pt able to reach arm to the L side to grab my hand and lean UE to the L since LEs were in L windswept position, pt had more difficulty performing same tasks on the R side. Pt unable to move trunk any further on either side. Pt cued for positioning.  Transfers Overall transfer level:  (unable)               General transfer comment: Pt unable to transfer, function limited within bed  Ambulation/Gait             General Gait Details: Pt unable to amb, function limited within bed   Stairs            Wheelchair Mobility    Modified Rankin  (Stroke Patients Only)       Balance Overall balance assessment: Needs assistance Sitting-balance support:  (unable to sit) Sitting balance-Leahy Scale: Zero Sitting balance - Comments: Pt unable to roll supine to sit within bed   Standing balance support:  (unable) Standing balance-Leahy Scale: Poor Standing balance comment: Pt unable to stand                            Cognition Arousal/Alertness: Awake/alert Behavior During Therapy: WFL for tasks assessed/performed Overall Cognitive Status: Difficult to assess                                        Exercises Other Exercises Other Exercises: Pt performed B ankle pumps 5x, R heel slides 10x, R hip abd/add with min assist. 10x arm raises.     General Comments        Pertinent Vitals/Pain Pain Assessment: Faces Pain Score: 0-No pain Faces Pain Scale: Hurts a little bit    Home Living Family/patient expects to be discharged to:: [place]: Hospital bed Additional Comments: Resident at [facility]    Prior Function Level of Independence: Needs assistance  Comments: Pt is a poor historian, pt does not appear to perform OOB mobility due to heel ulcers, knee flexion/hand contractures, LEs in L windswept position   PT Goals (current goals can now be found in the care plan section) Acute Rehab PT Goals Patient Stated Goal: to lose weight PT Goal Formulation: With patient Time For Goal Achievement: 08/24/17 Potential to Achieve Goals: Fair    Frequency    Min 2X/week (trial run of therapy)      PT Plan      Co-evaluation              AM-PAC PT "6 Clicks" Daily Activity  Outcome Measure  Difficulty turning over in bed (including adjusting bedclothes, sheets and blankets)?: Unable Difficulty moving from lying on back to sitting on the side of the bed? : Unable Difficulty sitting down on and standing up from a chair with  arms (e.g., wheelchair, bedside commode, etc,.)?: Unable Help needed moving to and from a bed to chair (including a wheelchair)?: Total Help needed walking in hospital room?: Total Help needed climbing 3-5 steps with a railing? : Total 6 Click Score: 6    End of Session   Activity Tolerance: Patient tolerated treatment well Patient left: in bed;with call bell/phone within reach;with bed alarm set Nurse Communication: Mobility status PT Visit Diagnosis: Muscle weakness (generalized) (M62.81);Other abnormalities of gait and mobility (R26.89)     Time: 1146-1200 PT Time Calculation (min) (ACUTE ONLY): 14 min  Charges:  $Therapeutic Exercise: 8-22 mins                    G Codes:  Functional Assessment Tool Used: AM-PAC 6 Clicks Basic Mobility Functional Limitation: Mobility: Walking and moving around Mobility: Walking and Moving Around Current Status (J8841): 100 percent impaired, limited or restricted Mobility: Walking and Moving Around Goal Status (Y6063): At least 80 percent but less than 100 percent impaired, limited or restricted    Manfred Arch, SPT   Manfred Arch 08/10/2017, 2:39 PM

## 2017-08-10 NOTE — Consult Note (Signed)
Pharmacy Antibiotic Note  Jerry [name] is a 61 y.o. male admitted on 08/02/2017 with UTI.  Pharmacy has been consulted for meropenem dosing.  Plan: meropenem 1g q 8 hours  Height: 6\' 2"  (188 cm) Weight: (!) 315 lb 8 oz (143.1 kg) IBW/kg (Calculated) : 82.2  Temp (24hrs), Avg:98.6 F (37 C), Min:98.3 F (36.8 C), Max:98.9 F (37.2 C)   Recent Labs Lab 08/04/17 0439 08/05/17 0634 08/06/17 1324 08/07/17 0720 08/08/17 0533 08/09/17 0426  WBC 4.7 5.0 6.1 4.8 4.4 4.7  CREATININE 0.39*  --   --  0.51*  --  0.42*    Estimated Creatinine Clearance: 146.2 mL/min (A) (by C-G formula based on SCr of 0.42 mg/dL (L)).    Allergies  Allergen Reactions  . Methadone   . Penicillins     .Has patient had a PCN reaction causing immediate rash, facial/tongue/throat swelling, SOB or lightheadedness with hypotension: Unknown Has patient had a PCN reaction causing severe rash involving mucus membranes or skin necrosis: Unknown Has patient had a PCN reaction that required hospitalization: Unknown Has patient had a PCN reaction occurring within the last 10 years: Unknown If all of the above answers are "NO", then may proceed with Cephalosporin use.   Marland Kitchen Propoxyphene   . Sulfa Antibiotics   . Valproic Acid And Related     Thrombocytopenia    Antimicrobials this admission: ceftriaxone 9/6 >> 9/7 meropenem 9/8 >> 9/10, 9/11 unasyn 9/10>>9/11 Fosfomycin 9/13 one dose  Dose adjustments this admission:   Microbiology results: 9/6 BCx: 1/2 coag neg staph 9/6 UCx: ESBL, enterococcus    Thank you for allowing pharmacy to be a part of this patient's care.  Ramond Dial, Pharm.D, BCPS Clinical Pharmacist  08/10/2017 2:57 PM

## 2017-08-10 NOTE — Plan of Care (Signed)
Called prime doc because when we were doing handoff, pt was shaking.  Pt had been on valproic acid, but was taken off on 9/10 when his platelets started dropping.  Nothing replaced the valproic acid (not sure why he was on it).  Dr. Benjie Karvonen said to monitor and she will take a look at his chart.

## 2017-08-10 NOTE — Progress Notes (Signed)
Per MD patient is not stable for D/C today. Plan is for patient to D/C back to [facility]. [facility] liaison is aware of above.   [employer], LCSW [phone]

## 2017-08-10 NOTE — Progress Notes (Signed)
[place] at [place] NAME: Gordan Grell    MR#:  [id number]  DATE OF BIRTH:  [birth year]    CHIEF COMPLAINT:   Chief Complaint  Patient presents with  . Bradycardia   Patient is more awake and alert and answering questions could answer his name and date of birth but still could not make  meaningful conversation.   REVIEW OF SYSTEMS:   Review of Systems  Unable to perform ROS: Psychiatric disorder    DRUG ALLERGIES:   Allergies  Allergen Reactions  . Methadone   . Penicillins     .Has patient had a PCN reaction causing immediate rash, facial/tongue/throat swelling, SOB or lightheadedness with hypotension: Unknown Has patient had a PCN reaction causing severe rash involving mucus membranes or skin necrosis: Unknown Has patient had a PCN reaction that required hospitalization: Unknown Has patient had a PCN reaction occurring within the last 10 years: Unknown If all of the above answers are "NO", then may proceed with Cephalosporin use.   Marland Kitchen Propoxyphene   . Sulfa Antibiotics   . Valproic Acid And Related     Thrombocytopenia    VITALS:  Blood pressure (!) 142/87, pulse 78, temperature 98.3 F (36.8 C), temperature source Oral, resp. rate 20, height 6\' 2"  (1.88 m), weight (!) 143.1 kg (315 lb 8 oz), SpO2 97 %.  PHYSICAL EXAMINATION:  GENERAL:  61 y.o.-year-old patient lying in the bed with no acute distress. Confused EYES: Pupils equal, round, reactive to light.. No scleral icterus. Extraocular muscles intact.  HEENT: Head atraumatic, normocephalic. Oropharynx and nasopharynx clear.  NECK:  Supple, no jugular venous distention. No thyroid enlargement, no tenderness.  LUNGS: Normal breath sounds bilaterally, no wheezing, rales,rhonchi or crepitation. No use of accessory muscles of respiration.  CARDIOVASCULAR: S1, S2 normal. No murmurs, rubs, or gallops.  ABDOMEN: Soft, nontender, nondistended. Bowel sounds present. No  organomegaly or mass.  EXTREMITIES: No pedal edema, cyanosis, or clubbing. Dark skin and atrophied toes. NEUROLOGIC: altered, unable to obtain full neurological exam. Moves limbs very slowly on commands, but able to lift against gravity. PSYCHIATRIC: altered- confused/ slow. SKIN: No obvious rash, lesion, or ulcer.    LABORATORY PANEL:   CBC  Recent Labs Lab 08/09/17 0426  WBC 4.7  HGB 8.6*  HCT 26.1*  PLT 31*   ------------------------------------------------------------------------------------------------------------------  Chemistries   Recent Labs Lab 08/09/17 0426  NA 142  K 3.4*  CL 107  CO2 30  GLUCOSE 74  BUN 9  CREATININE 0.42*  CALCIUM 8.1*  MG 2.0   ------------------------------------------------------------------------------------------------------------------  Cardiac Enzymes No results for input(s): TROPONINI in the last 168 hours. ------------------------------------------------------------------------------------------------------------------  RADIOLOGY:  No results found.  EKG:   Orders placed or performed during the hospital encounter of 08/02/17  . EKG 12-Lead  . EKG 12-Lead  . EKG 12-Lead  . EKG 12-Lead    ASSESSMENT AND PLAN:   #Septic shock with hypotension secondary to UTI Weand off pressors Levophed,[place] septic shock anymore urine cultures are showing Escherichia coli, Enterococcus faecalis. Sensitive to imipenem, Zosyn but resistant to ceftriaxone. Discontinued her Rocephin . Started patient on meropenemPer ID recommendations. The patient can be given 1 dose of fosfomycin prior to discharge and one more dose of fosfomycin 72 hours after the first dose- stop date 08/11/17. Blood culture with coag-negative staph aureus from one bottle only. Patient needs cosyntropin stimulation test after discontinuing steroids Checked TSH- high.  #Acute encephalopathy secondary to septic shock.  Neuro checks,  IV antibiotics  meropenem  #Thrombocytopenia secondary to sepsis no active bleeding/Valproic acid adverse effect  Monitor platelets Platelet count 32,000 -16,000-22,000 , 25000- 31000 improving after stopping valproic acid  Check CBC daily. Discontinued valproic acid  # Hypothyroidism   Increase levothyroxine dose today.  # bradycardia: Improved.  # hypothermia: Improved.  # rheumatoid arthritis -Chronic and no exacerbation at this time  Schizophrenia-consult psychiatry # .prognosis poor.  # hypoglycemia    On IV steroids BID, also on D10 50 ml/hr.   Likely due to hypothyroidism    Cont levothyroxine with higher dose.     Called palliative care consult. Family want to continue full aggressive treatment.  All the records are reviewed and case discussed with Care Management/Social Workerr. Management plans discussed with the patient, family and they are in agreement.  CODE STATUS: full  TOTAL TIME TAKING CARE OF THIS  Patient; 35 min  POSSIBLE D/C IN ?DAYS, DEPENDING ON CLINICAL CONDITION.   Vaughan Basta M.D on 08/10/2017 at 3:24 PM  Between 7am to 6pm - Pager - [phone]  After 6pm go to [email or web address] - password [email or web address] [facility]  Office  [phone]  CC: Primary care physician; Housecalls, Doctors Making   Note: This dictation was prepared with Dragon dictation along with smaller phrase technology. Any transcriptional errors that result from this process are unintentional.

## 2017-08-10 NOTE — Progress Notes (Signed)
Daily Progress Note   Patient Name: Jerry [name]       Date: 08/10/2017 DOB: 1956-01-15  Age: 61 y.o. MRN#: [id number] Attending Physician: Vaughan Basta, * Primary Care Physician: Orvis Brill, Doctors Making Admit Date: 08/02/2017  Reason for Consultation/Follow-up: Establishing goals of care  Subjective: Patient in bed, eating, enjoying his lunch. Tells me about bringing his grandfather back to life.   Spoke again with patient's [place].   She believes patient has good quality of life at [facility]. He enjoys eating, likes listening to music, specifically mot[place].   We discussed advanced directives, concepts specific to code status, artificial feeding and hydration, and rehospitalization were discussed. [name] states patient would desire full scope of care including full code status. She would consent to artificial feeding as well. Her goals of care at this point are to attempt to return patient to his baseline function of bedbound status, enjoying tv and food and visits from family and residing at [facility]. He has been living here for five years, happily.  Discussed problem with patient's hypoglycemia. [name] would like medical care to continue and attempts to stabilize patient before discharge. She and family are very invested in aggressive medical care.   Outpatient palliative services were described and offered. [name] would like outpatient palliative to follow for continued GOC.   Review of Systems  Unable to perform ROS: Mental acuity    Length of Stay: 8  Current Medications: Scheduled Meds:  . benztropine  0.5 mg Oral BID  . bisacodyl  10 mg Rectal Daily  . bisacodyl  10 mg Rectal Daily  . dextrose  50 mL Intravenous Once  . docusate sodium  100 mg Oral BID  .  fluPHENAZine  12.5 mg Oral q morning - 10a  . fluPHENAZine  15 mg Oral QHS  . hydroxychloroquine  200 mg Oral BID  . levothyroxine  200 mcg Oral QAC breakfast  . multivitamin with minerals  1 tablet Oral Daily  . potassium chloride  20 mEq Oral BID  . predniSONE  50 mg Oral Q breakfast  . protein supplement shake  11 oz Oral TID BM  . tamsulosin  0.4 mg Oral Daily  . traZODone  150 mg Oral QHS  . vitamin C  500 mg Oral BID    Continuous Infusions: . dextrose  30 mL/hr at 08/10/17 1010    PRN Meds: acetaminophen **OR** acetaminophen, albuterol, ipratropium-albuterol, [DISCONTINUED] ondansetron **OR** ondansetron (ZOFRAN) IV, oxyCODONE-acetaminophen, senna-docusate, sodium chloride flush  Physical Exam  Neurological:  Confused  Skin: Skin is warm and dry.  Nursing note and vitals reviewed.           Vital Signs: BP (!) 142/87 (BP Location: Left Arm)   Pulse 78   Temp 98.3 F (36.8 C) (Oral)   Resp 20   Ht 6\' 2"  (1.88 m)   Wt (!) 143.1 kg (315 lb 8 oz)   SpO2 97%   BMI 40.51 kg/m  SpO2: SpO2: 97 % O2 Device: O2 Device: Not Delivered O2 Flow Rate: O2 Flow Rate (L/min): 2 L/min  Intake/output summary:  Intake/Output Summary (Last 24 hours) at 08/10/17 1315 Last data filed at 08/10/17 1610  Gross per 24 hour  Intake          1657.67 ml  Output                0 ml  Net          1657.67 ml   LBM: Last BM Date:  (no bm recorded since admission) Baseline Weight: Weight: (!) 138.9 kg (306 lb 3.5 oz) Most recent weight: Weight: (!) 143.1 kg (315 lb 8 oz)       Palliative Assessment/Data: PPS: 30 %    Flowsheet Rows     Most Recent Value  Intake Tab  Referral Department  Hospitalist  Unit at Time of Referral  Oncology Unit  Palliative Care Primary Diagnosis  Sepsis/Infectious Disease  Date Notified  08/08/17  Palliative Care Type  New Palliative care  Reason for referral  Clarify Goals of Care  Date of Admission  08/02/17  # of days IP prior to Palliative  referral  6  Clinical Assessment  Psychosocial & Spiritual Assessment  Palliative Care Outcomes      Patient Active Problem List   Diagnosis Date Noted  . Schizophrenia ([place])   . Sepsis ([place])   . Palliative care by specialist   . Advance care planning   . Goals of care, counseling/discussion   . Altered mental status   . Septic shock ([place]) 08/02/2017  . Thrombocytopenia ([place]) 06/21/2017  . Leukopenia 06/21/2017  . Pressure injury of skin 06/21/2017    Palliative Care Assessment & Plan   Patient Profile: 61 y.o. male  with past medical history of CHF, rheumatoid arthritis, schizophrenia, septicemia due to pneumonia admitted on 08/02/2017 with sepsis UTI. During this admission patient required intubation. He has been extubated, but has been hypolglycemic, requiring D5 fluids to maintain blood glucose levels. He has dysphagia, but is eating approx 50% of meals on dysphagia diet.  Assessment/Recommendations/Plan   Recommend continued titration of dextrose  Continue full scope medical care  Social work referral for palliative services  Goals of Care and Additional Recommendations:  Limitations on Scope of Treatment: Full Scope Treatment  Code Status:  Full code  Prognosis:   > 12 months   Discharge Planning:  SNF with Palliative  Care plan was discussed with [name] Rey- patient's HCPOA.  Thank you for allowing the Palliative Medicine Team to assist in the care of this patient.   Time In: 1255 Time Out: 1355 Total Time 40 mins Prolonged Time Billed No      Greater than 50%  of this time was spent counseling and coordinating care related to the above assessment and plan.  Mariana Kaufman,  AGNP-C Palliative Medicine   Please contact Palliative Medicine Team phone at [phone] for questions and concerns.

## 2017-08-10 NOTE — Plan of Care (Signed)
Problem: Education: Goal: Knowledge of [facility] Education information/materials will improve Outcome: Progressing VSS, free of falls during shift.  Denies pain.  No needs overnight.  Repositioning Q2h for skin integrity, skin intact.  CVAD dsg changed, CDI.  Tubing, claves changed.  Bed in low position, call bell within reach.  WCTM.

## 2017-08-11 LAB — CBC
HEMATOCRIT: 25.5 % — AB (ref 40.0–52.0)
Hemoglobin: 8.5 g/dL — ABNORMAL LOW (ref 13.0–18.0)
MCH: 30.2 pg (ref 26.0–34.0)
MCHC: 33.6 g/dL (ref 32.0–36.0)
MCV: 90.1 fL (ref 80.0–100.0)
PLATELETS: 101 10*3/uL — AB (ref 150–440)
RBC: 2.83 MIL/uL — AB (ref 4.40–5.90)
RDW: 18 % — ABNORMAL HIGH (ref 11.5–14.5)
WBC: 5.9 10*3/uL (ref 3.8–10.6)

## 2017-08-11 LAB — BASIC METABOLIC PANEL
Anion gap: 4 — ABNORMAL LOW (ref 5–15)
BUN: 15 mg/dL (ref 6–20)
CO2: 28 mmol/L (ref 22–32)
Calcium: 8.3 mg/dL — ABNORMAL LOW (ref 8.9–10.3)
Chloride: 111 mmol/L (ref 101–111)
Creatinine, Ser: 0.38 mg/dL — ABNORMAL LOW (ref 0.61–1.24)
GFR calc Af Amer: >60 mL/min (ref >60)
GLUCOSE: 101 mg/dL — AB (ref 65–99)
POTASSIUM: 3.9 mmol/L (ref 3.5–5.1)
Sodium: 143 mmol/L (ref 135–145)

## 2017-08-11 LAB — GLUCOSE, CAPILLARY
Glucose-Capillary: 111 mg/dL — ABNORMAL HIGH (ref 65–99)
Glucose-Capillary: 122 mg/dL — ABNORMAL HIGH (ref 65–99)
Glucose-Capillary: 136 mg/dL — ABNORMAL HIGH (ref 65–99)
Glucose-Capillary: 79 mg/dL (ref 65–99)
Glucose-Capillary: 83 mg/dL (ref 65–99)
Glucose-Capillary: 91 mg/dL (ref 65–99)

## 2017-08-11 NOTE — Progress Notes (Signed)
[place] at [place] NAME: Tirth Cothron    MR#:  [id number]  DATE OF BIRTH:  24-Oct-1956  Patient still has a low sugars. He denies any complaints.   CHIEF COMPLAINT:   Chief Complaint  Patient presents with  . Bradycardia   Patient is more awake and alert and answering questions could answer his name and date of birth but still could not make  meaningful conversation.   REVIEW OF SYSTEMS:   Review of Systems  Unable to perform ROS: Psychiatric disorder    DRUG ALLERGIES:   Allergies  Allergen Reactions  . Methadone   . Penicillins     .Has patient had a PCN reaction causing immediate rash, facial/tongue/throat swelling, SOB or lightheadedness with hypotension: Unknown Has patient had a PCN reaction causing severe rash involving mucus membranes or skin necrosis: Unknown Has patient had a PCN reaction that required hospitalization: Unknown Has patient had a PCN reaction occurring within the last 10 years: Unknown If all of the above answers are "NO", then may proceed with Cephalosporin use.   Marland Kitchen Propoxyphene   . Sulfa Antibiotics   . Valproic Acid And Related     Thrombocytopenia    VITALS:  Blood pressure 130/90, pulse 77, temperature 98.3 F (36.8 C), resp. rate (!) 22, height 6\' 2"  (1.88 m), weight (!) 143.2 kg (315 lb 10 oz), SpO2 100 %.  PHYSICAL EXAMINATION:  GENERAL:  61 y.o.-year-old patient lying in the bed with no acute distress. Confused EYES: Pupils equal, round, reactive to light.. No scleral icterus. Extraocular muscles intact.  HEENT: Head atraumatic, normocephalic. Oropharynx and nasopharynx clear.  NECK:  Supple, no jugular venous distention. No thyroid enlargement, no tenderness.  LUNGS: Normal breath sounds bilaterally, no wheezing, rales,rhonchi or crepitation. No use of accessory muscles of respiration.  CARDIOVASCULAR: S1, S2 normal. No murmurs, rubs, or gallops.  ABDOMEN: Soft, nontender,  nondistended. Bowel sounds present. No organomegaly or mass.  EXTREMITIES: No pedal edema, cyanosis, or clubbing. Dark skin and atrophied toes. NEUROLOGIC: altered, unable to obtain full neurological exam. Moves limbs very slowly on commands, but able to lift against gravity. PSYCHIATRIC: altered- confused/ slow. SKIN: No obvious rash, lesion, or ulcer.  Bilateral heel boots present.  LABORATORY PANEL:   CBC  Recent Labs Lab 08/11/17 0621  WBC 5.9  HGB 8.5*  HCT 25.5*  PLT 101*   ------------------------------------------------------------------------------------------------------------------  Chemistries   Recent Labs Lab 08/09/17 0426 08/11/17 0621  NA 142 143  K 3.4* 3.9  CL 107 111  CO2 30 28  GLUCOSE 74 101*  BUN 9 15  CREATININE 0.42* 0.38*  CALCIUM 8.1* 8.3*  MG 2.0  --    ------------------------------------------------------------------------------------------------------------------  Cardiac Enzymes No results for input(s): TROPONINI in the last 168 hours. ------------------------------------------------------------------------------------------------------------------  RADIOLOGY:  No results found.  EKG:   Orders placed or performed during the hospital encounter of 08/02/17  . EKG 12-Lead  . EKG 12-Lead  . EKG 12-Lead  . EKG 12-Lead    ASSESSMENT AND PLAN:   #Septic shock with hypotension secondary to UTI Weand off pressors Levophed,not in septic shock anymore urine cultures are showing Escherichia coli, Enterococcus faecalis. Sensitive to imipenem, Zosyn but resistant to ceftriaxone. Discontinued her Rocephin . Started patient on meropenemPer ID recommendations. The patient can be given 1 dose of fosfomycin prior to discharge and one more dose of fosfomycin 72 hours after the first dose-  Blood culture with coag-negative staph aureus from one bottle  only. Patient needs cosyntropin stimulation test after discontinuing steroids. Patient still on  prednisone 50 mg daily, weaning down slowly. Checked TSH- high. So he increased the dose of Synthroid.  #Acute encephalopathy secondary to septic shock.  Neuro checks, IV antibiotics meropenem patient looks like  He  is at baseline;  #Thrombocytopenia secondary to sepsis no active bleeding/Valproic acid adverse effect  Monitor platelets, platelet count is improving, today it is 101. Discontinued valproic acid  # Hypothyroidism   Increased levothyroxine   # bradycardia: Improved.  # hypothermia: Improved.  # rheumatoid arthritis -Chronic and no exacerbation at this time  Schizophrenia-consult psychiatry # .prognosis poor.  # hypoglycemia    On IV steroids BID, also on D30 ml/hr.   Likely due to hypothyroidism    Cont levothyroxine with higher dose.     Called palliative care consult. Family want to continue full aggressive treatment.  All the records are reviewed and case discussed with Care Management/Social Workerr. Management plans discussed with the patient, family and they are in agreement.  CODE STATUS: full  TOTAL TIME TAKING CARE OF THIS  Patient; 35 min  POSSIBLE D/C IN ?DAYS, DEPENDING ON CLINICAL CONDITION.   Epifanio Lesches M.D on 08/11/2017 at 9:12 AM  Between 7am to 6pm - Pager - [phone]  After 6pm go to [email or web address] - password [email or web address] [facility]  Office  [phone]  CC: Primary care physician; Housecalls, Doctors Making   Note: This dictation was prepared with Dragon dictation along with smaller phrase technology. Any transcriptional errors that result from this process are unintentional.

## 2017-08-12 LAB — GLUCOSE, CAPILLARY
GLUCOSE-CAPILLARY: 104 mg/dL — AB (ref 65–99)
GLUCOSE-CAPILLARY: 85 mg/dL (ref 65–99)
GLUCOSE-CAPILLARY: 87 mg/dL (ref 65–99)
Glucose-Capillary: 116 mg/dL — ABNORMAL HIGH (ref 65–99)
Glucose-Capillary: 107 mg/dL — ABNORMAL HIGH (ref 65–99)
Glucose-Capillary: 132 mg/dL — ABNORMAL HIGH (ref 65–99)

## 2017-08-12 NOTE — Progress Notes (Signed)
[place] at [place] NAME: Jerry [name]    MR#:  [id number]  DATE OF BIRTH:  May 06, 1956  Seen at bedside. No complaints. BG 85 this morning.Marland Kitchen   CHIEF COMPLAINT:   Chief Complaint  Patient presents with  . Bradycardia     REVIEW OF SYSTEMS:   Review of Systems  Unable to perform ROS: Psychiatric disorder    DRUG ALLERGIES:   Allergies  Allergen Reactions  . Methadone   . Penicillins     .Has patient had a PCN reaction causing immediate rash, facial/tongue/throat swelling, SOB or lightheadedness with hypotension: Unknown Has patient had a PCN reaction causing severe rash involving mucus membranes or skin necrosis: Unknown Has patient had a PCN reaction that required hospitalization: Unknown Has patient had a PCN reaction occurring within the last 10 years: Unknown If all of the above answers are "NO", then may proceed with Cephalosporin use.   Marland Kitchen Propoxyphene   . Sulfa Antibiotics   . Valproic Acid And Related     Thrombocytopenia    VITALS:  Blood pressure (!) 152/90, pulse 79, temperature 98.9 F (37.2 C), temperature source Oral, resp. rate 18, height 6\' 2"  (1.88 m), weight (!) 145.3 kg (320 lb 4.8 oz), SpO2 100 %.  PHYSICAL EXAMINATION:  GENERAL:  61 y.o.-year-old patient lying in the bed with no acute distress. Confused EYES: Pupils equal, round, reactive to light.. No scleral icterus. Extraocular muscles intact.  HEENT: Head atraumatic, normocephalic. Oropharynx and nasopharynx clear.  NECK:  Supple, no jugular venous distention. No thyroid enlargement, no tenderness.  LUNGS: Normal breath sounds bilaterally, no wheezing, rales,rhonchi or crepitation. No use of accessory muscles of respiration.  CARDIOVASCULAR: S1, S2 normal. No murmurs, rubs, or gallops.  ABDOMEN: Soft, nontender, nondistended. Bowel sounds present. No organomegaly or mass.  EXTREMITIES: No pedal edema, cyanosis, or clubbing. Dark skin and  atrophied toes. NEUROLOGIC: altered, unable to obtain full neurological exam. Moves limbs very slowly on commands, but able to lift against gravity. PSYCHIATRIC: altered- confused/ slow. SKIN: No obvious rash, lesion, or ulcer.  Bilateral heel boots present.  LABORATORY PANEL:   CBC  Recent Labs Lab 08/11/17 0621  WBC 5.9  HGB 8.5*  HCT 25.5*  PLT 101*   ------------------------------------------------------------------------------------------------------------------  Chemistries   Recent Labs Lab 08/09/17 0426 08/11/17 0621  NA 142 143  K 3.4* 3.9  CL 107 111  CO2 30 28  GLUCOSE 74 101*  BUN 9 15  CREATININE 0.42* 0.38*  CALCIUM 8.1* 8.3*  MG 2.0  --    ------------------------------------------------------------------------------------------------------------------  Cardiac Enzymes No results for input(s): TROPONINI in the last 168 hours. ------------------------------------------------------------------------------------------------------------------  RADIOLOGY:  No results found.  EKG:   Orders placed or performed during the hospital encounter of 08/02/17  . EKG 12-Lead  . EKG 12-Lead  . EKG 12-Lead  . EKG 12-Lead    ASSESSMENT AND PLAN:   #Septic shock with hypotension secondary to UTI Weand off pressors Levophed,not in septic shock anymore urine cultures are showing Escherichia coli, Enterococcus faecalis. Sensitive to imipenem, Zosyn but resistant to ceftriaxone. Discontinued her Rocephin . Started patient on meropenemPer ID recommendations. The patient can be given 1 dose of fosfomycin prior to discharge and one more dose of fosfomycin 72 hours after the first dose-  Blood culture with coag-negative staph aureus from one bottle only. Patient needs cosyntropin stimulation test after discontinuing steroids. Patient still on prednisone 50 mg daily, weaning down slowly. Checked TSH- high. So  he increased the dose of Synthroid.  #Acute encephalopathy  secondary to septic shock.  Neuro checks, IV antibiotics meropenem patient looks like  He  is at baseline;  #Thrombocytopenia secondary to sepsis no active bleeding/Valproic acid adverse effect  Monitor platelets, platelet count is improving, Discontinued valproic acid  # Hypothyroidism   Increased levothyroxine   # bradycardia: Improved.  # hypothermia: Improved.  # rheumatoid arthritis -Chronic and no exacerbation at this time  Schizophrenia-consult psychiatry # .prognosis poor.  # hypoglycemia    Likely secondary to antipsychotics. Stop the IV D10, continued steroids, check blood glucose levels every 4, patient is eating well according to CNA.     Called palliative care consult. Family want to continue full aggressive treatment.  All the records are reviewed and case discussed with Care Management/Social Workerr. Management plans discussed with the patient, family and they are in agreement.  CODE STATUS: full  TOTAL TIME TAKING CARE OF THIS  Patient; 35 min  POSSIBLE D/C IN ?DAYS, DEPENDING ON CLINICAL CONDITION.   Epifanio Lesches M.D on 08/12/2017 at 8:44 AM  Between 7am to 6pm - Pager - [phone]  After 6pm go to [email or web address] - password [email or web address] [facility]  Office  [phone]  CC: Primary care physician; Housecalls, Doctors Making   Note: This dictation was prepared with Dragon dictation along with smaller phrase technology. Any transcriptional errors that result from this process are unintentional.

## 2017-08-13 LAB — GLUCOSE, CAPILLARY
GLUCOSE-CAPILLARY: 77 mg/dL (ref 65–99)
Glucose-Capillary: 100 mg/dL — ABNORMAL HIGH (ref 65–99)
Glucose-Capillary: 110 mg/dL — ABNORMAL HIGH (ref 65–99)
Glucose-Capillary: 73 mg/dL (ref 65–99)

## 2017-08-13 MED ORDER — PREDNISONE 10 MG PO TABS: 10.0000 mg | ORAL_TABLET | Freq: Every day | ORAL | 0 refills | Status: DC

## 2017-08-13 MED ORDER — PREMIER PROTEIN SHAKE: 11.0000 [oz_av] | Freq: Three times a day (TID) | ORAL | 0 refills | Status: DC

## 2017-08-13 MED ORDER — LEVOTHYROXINE SODIUM 200 MCG PO TABS: 200.0000 ug | ORAL_TABLET | Freq: Every day | ORAL | 0 refills | Status: DC

## 2017-08-13 NOTE — Progress Notes (Addendum)
LCSW following for discharge planning:  [facility] to [facility]  Patient is medically stable for discharge per attending. Updated [facility] who [facility] in agreement with plan and [facility] sent to facility via [facility]. Patient will transport by EMS LCSW to update family:  [name] contacted via phone and has been given clinical update and discharge plans. She is in agreement and appreciative of call.  Patient going to room:  507 Report called to:  [phone]  Lane Hacker, MSW Clinical Social Work: System Wide Float Coverage for :  [phone]

## 2017-08-13 NOTE — Progress Notes (Addendum)
Report given to Kim at [facility]. Will contact EMS once patient is packed and ready for transport.   Kim aware pt has external male catheter in place (and can be removed) until he arrives back at facility since we do not have bariatric briefs.

## 2017-08-13 NOTE — Discharge Summary (Signed)
Jerry [name], is a 61 y.o. male  DOB 10-16-1956  MRN [id number].  Admission date:  08/02/2017  Admitting Physician  Bettey Costa, MD  Discharge Date:  08/13/2017   Primary MD  [facility]  Recommendations for primary care physician for things to follow:   [facility] is [facility].   Admission Diagnosis  Other specified hypotension [I95.89] CHF (congestive heart failure) ([place]) [I50.9] Cystitis [N30.90] Sepsis, due to unspecified organism ([place]) [A41.9] Altered mental status, unspecified altered mental status type [R41.82]   Discharge Diagnosis  Other specified hypotension [I95.89] CHF (congestive heart failure) ([place]) [I50.9] Cystitis [N30.90] Sepsis, due to unspecified organism ([place]) [A41.9] Altered mental status, unspecified altered mental status type [R41.82]    Active Problems:   Septic shock ([place])   Altered mental status   Schizophrenia ([place])   Sepsis ([place])   Palliative care by specialist   Advance care planning   Goals of care, counseling/discussion   CHF (congestive heart failure) ([place])      Past Medical History:  Diagnosis Date  . BPH (benign prostatic hyperplasia)   . CHF (congestive heart failure) ([place])   . Chronic pain   . COPD (chronic obstructive pulmonary disease) ([place])   . Hypertension   . Morbid obesity ([place])   . Pressure ulcer   . Rheumatoid arthritis ([place])   . Schizophrenia ([place])   . Thyroid disease     History reviewed. No pertinent surgical history.     History of present illness and  Hospital Course:     Kindly see H&P for history of present illness and admission details, please review complete Labs, Consult reports and Test reports for all details in brief  HPI  from the history and physical done on the day of admission  61 year old male  patient admitted on September 6 for bradycardia, altered mental status, decreased urine output for 2 days at the nursing home. Patient found to have heart rate of 40s and became. Patient also had not provided since September 4. Patient admitted on September 6. Admitted to hospitalist service for severe hypothermia, bradycardia, possible sepsis. Patient: Body temperature was 95.5 Fahrenheit. He also was hypotensive with blood pressure 92/48 on admission.   Hospital Course  1. Septic shock on admission; evidence of bradycardia, hypothermia, hypotension and Dr. mental status. Admitted to intensive care unit, started on pressors. Patient thought to have UTI. Started on IV Azactam, Levaquin, vancomycin Urine culture showed ESBL. So we discontinued IV Azactam, Levaquin, vancomycin, Dr. Ola Spurr recommended meropenem. Patient received meropenem from September 8 to September 15. A total of 7 days. Culture showed coagulase-negative staph bacteremia. #2 . Altered mental status secondary to sepsis and metabolic encephalopathy: Patient is back to baseline. #3. History of COPD: No wheezing. Continue home dose inhalers and nebulizers.  #4 thrombocytopenia secondary to sepsis, side effect of valproic acid. Patient initial count improved after stopping valproic acid and also started taking sepsis. Platelet count as low as 16,000. Improved, most recent platelet count 101 on September 15. #5 .bradycardia, hyperkalemia resolved. Most recent recorded temperature 98.6 Fahrenheit this morning, heart rate is 90. #6 hypothyroidism: Patient TSH is elevated, Synthroid dose has been increased to 100 g daily. #7 history of rheumatoid arthritis. Chronic, no flareups this time. #8..  Hypoglycemia secondary to antipsychotics. Patient did receive D10 in her IV hydration for few days. Stopped IV d10 fluids  yesterday, but glucose is holding up around 100. Patient needs cosyntropin challenge test after stopping the prednisone. #  9.  Schizophrenia: Stable, patient doesn't make sense when he talks at baseline. Continue lithium, Klonopin, benzatropin,, fluphenazine.  Essential hypertension: Hypotension resolved, restarted blood pressure medicines. Continue amlodipine 10 mg daily, Coreg 25 mg by mouth twice a day, clonidine 0.3 mg every 24 hours.      Discharge condition is stable.   Follow UP  Contact information for after-discharge care    Destination    [facility] .   Specialty:  [place] information: [address] [place] [phone]                Discharge Instructions  and  Discharge Medications      Allergies as of 08/13/2017      Reactions   Methadone    Penicillins    .Has patient had a PCN reaction causing immediate rash, facial/tongue/throat swelling, SOB or lightheadedness with hypotension: Unknown Has patient had a PCN reaction causing severe rash involving mucus membranes or skin necrosis: Unknown Has patient had a PCN reaction that required hospitalization: Unknown Has patient had a PCN reaction occurring within the last 10 years: Unknown If all of the above answers are "NO", then may proceed with Cephalosporin use.   Propoxyphene    Sulfa Antibiotics    Valproic Acid And Related    Thrombocytopenia      Medication List    STOP taking these medications   divalproex 125 MG capsule Commonly known as:  DEPAKOTE SPRINKLE     TAKE these medications   albuterol 108 (90 Base) MCG/ACT inhaler Commonly known as:  PROVENTIL HFA;VENTOLIN HFA Inhale 1 puff into the lungs every 3 (three) hours as needed for wheezing or shortness of breath.   amLODipine 10 MG tablet Commonly known as:  NORVASC Take 10 mg by mouth daily.   benztropine 0.5 MG tablet Commonly known as:  COGENTIN Take 0.5 mg by mouth 2 (two) times daily.   carvedilol 25 MG tablet Commonly known as:  COREG Take 25 mg by mouth 2 (two) times daily  with a meal.   clonazePAM 1 MG tablet Commonly known as:  KLONOPIN Take 1 tablet (1 mg total) by mouth at bedtime.   cloNIDine 0.3 mg/24hr patch Commonly known as:  CATAPRES - Dosed in mg/24 hr Place 0.3 mg onto the skin once a week. Tuesday   docusate sodium 100 MG capsule Commonly known as:  COLACE Take 100 mg by mouth 2 (two) times daily.   fluPHENAZine 2.5 MG tablet Commonly known as:  PROLIXIN Take 5 tablets (12.5 mg total) by mouth daily. Take 12.5 mg by mouth in the morning and 15 mg by mouth at bedtime.   hydroxychloroquine 200 MG tablet Commonly known as:  PLAQUENIL Take 200 mg by mouth 2 (two) times daily.   levothyroxine 200 MCG tablet Commonly known as:  SYNTHROID, LEVOTHROID Take 1 tablet (200 mcg total) by mouth daily before breakfast. What changed:  medication strength  how much to take   methocarbamol 750 MG tablet Commonly known as:  ROBAXIN Take 750 mg by mouth at bedtime.   MIRALAX PO Take 17 g by mouth daily.   multivitamin with minerals tablet Take 1 tablet by mouth daily.   potassium chloride 10 MEQ CR capsule Commonly known as:  MICRO-K Take 10 mEq by mouth daily.   predniSONE 10 MG tablet Commonly known as:  DELTASONE Take 1 tablet (10 mg total) by mouth daily. 50 MG daily for 2 days 40 mg  daily for 2 days 30 mg daily for 2 days 10 mg daily for 2 days and then stop   protein supplement shake Liqd Commonly known as:  PREMIER PROTEIN Take 325 mLs (11 oz total) by mouth 3 (three) times daily between meals.   tamsulosin 0.4 MG Caps capsule Commonly known as:  FLOMAX Take 0.4 mg by mouth daily.   torsemide 10 MG tablet Commonly known as:  DEMADEX Take 10 mg by mouth daily.   traZODone 150 MG tablet Commonly known as:  DESYREL Take 150 mg by mouth at bedtime.   vitamin C 250 MG tablet Commonly known as:  ASCORBIC ACID Take 500 mg by mouth 2 (two) times daily.            Discharge Care Instructions        Start     Ordered    08/14/17 0000  levothyroxine (SYNTHROID, LEVOTHROID) 200 MCG tablet  Daily before breakfast     08/13/17 1007   08/13/17 0000  protein supplement shake (PREMIER PROTEIN) LIQD  3 times daily between meals     08/13/17 1007   08/13/17 0000  predniSONE (DELTASONE) 10 MG tablet  Daily     08/13/17 1007        Diet and Activity recommendation: See Discharge Instructions above   Consults obtained - ID consult, critical care consult   Major procedures and Radiology Reports - PLEASE review detailed and final reports for all details, in brief -      Ct Head Wo Contrast  Result Date: 08/02/2017 CLINICAL DATA:  Altered level of consciousness EXAM: CT HEAD WITHOUT CONTRAST TECHNIQUE: Contiguous axial images were obtained from the base of the skull through the vertex without intravenous contrast. COMPARISON:  06/28/2017 FINDINGS: Brain: Moderate atrophy unchanged. Negative for acute infarct. Negative for hemorrhage or mass lesion. Vascular: Negative for hyperdense vessel Skull: Negative Sinuses/Orbits: Air-fluid level in the sphenoid sinus. Mild mucosal edema in the maxillary sinuses bilaterally. Bilateral exophthalmos. No orbital mass. Other: None IMPRESSION: No acute intracranial abnormality.  Generalized atrophy Sinus mucosal disease Electronically Signed   By: Franchot Gallo M.D.   On: 08/02/2017 11:48   Dg Chest Port 1 View  Result Date: 08/03/2017 CLINICAL DATA:  Central line placement. EXAM: PORTABLE CHEST 1 VIEW COMPARISON:  Chest radiograph yesterday FINDINGS: Tip of the right central line in the distal SVC. No evidence of large pneumothorax, low lung volumes limit assessment. Low lung volumes persist, similar to prior exam. Stable cardiomegaly and pulmonary vascular congestion. Again seen left lung base scarring. Mild right infrahilar atelectasis. Otherwise no new abnormality. IMPRESSION: Tip of the right central line in the distal SVC. No large pneumothorax. Stable cardiomegaly, vascular  congestion and left basilar atelectasis/scarring. Mild right infrahilar atelectasis. Electronically Signed   By: Jeb Levering M.D.   On: 08/03/2017 03:21   Dg Chest Port 1 View  Result Date: 08/02/2017 CLINICAL DATA:  Bradycardia. EXAM: PORTABLE CHEST 1 VIEW COMPARISON:  Radiograph of June 29, 2017. FINDINGS: Stable cardiomegaly. Atherosclerosis of thoracic aorta is noted. No pneumothorax is noted. Mild central pulmonary vascular congestion is noted. Stable left basilar atelectasis or scarring is noted. Bony thorax is unremarkable. IMPRESSION: Stable cardiomegaly with central pulmonary vascular congestion. Aortic atherosclerosis. Stable left basilar atelectasis or scarring. Electronically Signed   By: Marijo Conception, M.D.   On: 08/02/2017 11:36    Micro Results    No results found for this or any previous visit (from the past 240 hour(s)).  Today   Subjective:   Jerry [name] today is stable for discharge. Objective:   Blood pressure (!) 149/70, pulse 62, temperature 98.6 F (37 C), temperature source Oral, resp. rate (!) 23, height 6\' 2"  (1.88 m), weight (!) 146 kg (321 lb 12.8 oz), SpO2 96 %.   Intake/Output Summary (Last 24 hours) at 08/13/17 1010 Last data filed at 08/13/17 0427  Gross per 24 hour  Intake                0 ml  Output             2450 ml  Net            -2450 ml    Exam Awake Aler, No new F.N deficits, Normal affect Jerry [name],Jerry [name] Supple Neck,No JVD, No cervical lymphadenopathy appriciated.  Symmetrical Chest wall movement, Good air movement bilaterally, CTAB RRR,No Gallops,Rubs or new Murmurs, No Parasternal Heave +ve B.Sounds, Abd Soft, Non tender, No organomegaly appriciated, No rebound -guarding or rigidity. No Cyanosis, Clubbing or edema, No new Rash or bruise  Data Review   CBC w Diff:  Lab Results  Component Value Date   WBC 5.9 08/11/2017   HGB 8.5 (L) 08/11/2017   HGB 11.2 (L) 01/19/2013   HCT 25.5 (L) 08/11/2017   HCT 34.0 (L)  01/19/2013   PLT 101 (L) 08/11/2017   PLT 334 01/19/2013   LYMPHOPCT 40 08/02/2017   LYMPHOPCT 18.9 01/19/2013   MONOPCT 8 08/02/2017   MONOPCT 8.0 01/19/2013   EOSPCT 6 08/02/2017   EOSPCT 3.2 01/19/2013   BASOPCT 0 08/02/2017   BASOPCT 0.8 01/19/2013    CMP:  Lab Results  Component Value Date   NA 143 08/11/2017   NA 140 01/05/2013   K 3.9 08/11/2017   K 3.8 01/05/2013   CL 111 08/11/2017   CL 105 01/05/2013   CO2 28 08/11/2017   CO2 28 01/05/2013   BUN 15 08/11/2017   BUN 15 01/05/2013   CREATININE 0.38 (L) 08/11/2017   CREATININE 0.75 01/05/2013   PROT 7.2 08/02/2017   PROT 8.1 01/05/2013   ALBUMIN 3.0 (L) 08/02/2017   ALBUMIN 2.9 (L) 01/05/2013   BILITOT 0.3 08/02/2017   BILITOT 0.6 01/05/2013   ALKPHOS 92 08/02/2017   ALKPHOS 79 01/05/2013   AST 38 08/02/2017   AST 20 01/05/2013   ALT 35 08/02/2017   ALT 9 (L) 01/05/2013  .   Total Time in preparing paper work, data evaluation and todays exam - 68 minutes  Jerry [name] M.D on 08/13/2017 at 10:10 AM    Note: This dictation was prepared with Dragon dictation along with smaller phrase technology. Any transcriptional errors that result from this process are unintentional.

## 2017-12-25 ENCOUNTER — Emergency Department: Payer: Medicaid Other

## 2017-12-25 ENCOUNTER — Encounter: Payer: Self-pay | Admitting: Emergency Medicine

## 2017-12-25 ENCOUNTER — Inpatient Hospital Stay
Admission: EM | Admit: 2017-12-25 | Discharge: 2017-12-30 | DRG: 871 | Disposition: A | Discharge status: Skilled Nursing Facility | Payer: Medicaid Other | Attending: Internal Medicine | Admitting: Internal Medicine

## 2017-12-25 ENCOUNTER — Other Ambulatory Visit: Payer: Self-pay

## 2017-12-25 DIAGNOSIS — I110 Hypertensive heart disease with heart failure: Secondary | ICD-10-CM | POA: Diagnosis present

## 2017-12-25 DIAGNOSIS — G8929 Other chronic pain: Secondary | ICD-10-CM | POA: Diagnosis present

## 2017-12-25 DIAGNOSIS — G9349 Other encephalopathy: Secondary | ICD-10-CM | POA: Diagnosis present

## 2017-12-25 DIAGNOSIS — F209 Schizophrenia, unspecified: Secondary | ICD-10-CM | POA: Diagnosis present

## 2017-12-25 DIAGNOSIS — I5032 Chronic diastolic (congestive) heart failure: Secondary | ICD-10-CM | POA: Diagnosis present

## 2017-12-25 DIAGNOSIS — E039 Hypothyroidism, unspecified: Secondary | ICD-10-CM | POA: Diagnosis present

## 2017-12-25 DIAGNOSIS — E662 Morbid (severe) obesity with alveolar hypoventilation: Secondary | ICD-10-CM | POA: Diagnosis not present

## 2017-12-25 DIAGNOSIS — J440 Chronic obstructive pulmonary disease with acute lower respiratory infection: Secondary | ICD-10-CM | POA: Diagnosis present

## 2017-12-25 DIAGNOSIS — J189 Pneumonia, unspecified organism: Secondary | ICD-10-CM | POA: Diagnosis present

## 2017-12-25 DIAGNOSIS — A419 Sepsis, unspecified organism: Secondary | ICD-10-CM | POA: Diagnosis present

## 2017-12-25 DIAGNOSIS — J9621 Acute and chronic respiratory failure with hypoxia: Secondary | ICD-10-CM | POA: Diagnosis not present

## 2017-12-25 DIAGNOSIS — Z885 Allergy status to narcotic agent status: Secondary | ICD-10-CM

## 2017-12-25 DIAGNOSIS — E2740 Unspecified adrenocortical insufficiency: Secondary | ICD-10-CM | POA: Diagnosis present

## 2017-12-25 DIAGNOSIS — D696 Thrombocytopenia, unspecified: Secondary | ICD-10-CM | POA: Diagnosis present

## 2017-12-25 DIAGNOSIS — T68XXXA Hypothermia, initial encounter: Secondary | ICD-10-CM | POA: Diagnosis not present

## 2017-12-25 DIAGNOSIS — K5900 Constipation, unspecified: Secondary | ICD-10-CM | POA: Diagnosis present

## 2017-12-25 DIAGNOSIS — J9622 Acute and chronic respiratory failure with hypercapnia: Secondary | ICD-10-CM

## 2017-12-25 DIAGNOSIS — N390 Urinary tract infection, site not specified: Secondary | ICD-10-CM | POA: Diagnosis present

## 2017-12-25 DIAGNOSIS — Z880 Allergy status to penicillin: Secondary | ICD-10-CM

## 2017-12-25 DIAGNOSIS — R579 Shock, unspecified: Secondary | ICD-10-CM | POA: Diagnosis not present

## 2017-12-25 DIAGNOSIS — I959 Hypotension, unspecified: Secondary | ICD-10-CM | POA: Diagnosis not present

## 2017-12-25 DIAGNOSIS — Z79890 Hormone replacement therapy (postmenopausal): Secondary | ICD-10-CM

## 2017-12-25 DIAGNOSIS — Z79899 Other long term (current) drug therapy: Secondary | ICD-10-CM

## 2017-12-25 DIAGNOSIS — N400 Enlarged prostate without lower urinary tract symptoms: Secondary | ICD-10-CM | POA: Diagnosis present

## 2017-12-25 DIAGNOSIS — R6521 Severe sepsis with septic shock: Secondary | ICD-10-CM | POA: Diagnosis present

## 2017-12-25 DIAGNOSIS — E66813 Obesity, class 3: Secondary | ICD-10-CM | POA: Diagnosis present

## 2017-12-25 DIAGNOSIS — Z87440 Personal history of urinary (tract) infections: Secondary | ICD-10-CM

## 2017-12-25 DIAGNOSIS — E271 Primary adrenocortical insufficiency: Secondary | ICD-10-CM | POA: Diagnosis not present

## 2017-12-25 DIAGNOSIS — R680 Hypothermia, not associated with low environmental temperature: Secondary | ICD-10-CM | POA: Diagnosis present

## 2017-12-25 DIAGNOSIS — R001 Bradycardia, unspecified: Secondary | ICD-10-CM

## 2017-12-25 DIAGNOSIS — Z888 Allergy status to other drugs, medicaments and biological substances status: Secondary | ICD-10-CM

## 2017-12-25 DIAGNOSIS — R4182 Altered mental status, unspecified: Secondary | ICD-10-CM

## 2017-12-25 DIAGNOSIS — Z6838 Body mass index (BMI) 38.0-38.9, adult: Secondary | ICD-10-CM

## 2017-12-25 DIAGNOSIS — Z882 Allergy status to sulfonamides status: Secondary | ICD-10-CM

## 2017-12-25 DIAGNOSIS — M069 Rheumatoid arthritis, unspecified: Secondary | ICD-10-CM | POA: Diagnosis present

## 2017-12-25 DIAGNOSIS — J9690 Respiratory failure, unspecified, unspecified whether with hypoxia or hypercapnia: Secondary | ICD-10-CM

## 2017-12-25 DIAGNOSIS — T189XXA Foreign body of alimentary tract, part unspecified, initial encounter: Secondary | ICD-10-CM

## 2017-12-25 DIAGNOSIS — Y95 Nosocomial condition: Secondary | ICD-10-CM | POA: Diagnosis present

## 2017-12-25 LAB — URINALYSIS, COMPLETE (UACMP) WITH MICROSCOPIC
Bilirubin Urine: NEGATIVE
Glucose, UA: NEGATIVE mg/dL
Hgb urine dipstick: NEGATIVE
KETONES UR: NEGATIVE mg/dL
Nitrite: POSITIVE — AB
PH: 5 (ref 5.0–8.0)
Protein, ur: NEGATIVE mg/dL
Specific Gravity, Urine: 1.018 (ref 1.005–1.030)

## 2017-12-25 LAB — CBC WITH DIFFERENTIAL/PLATELET
BASOS PCT: 0 %
Basophils Absolute: 0 10*3/uL (ref 0–0.1)
Eosinophils Absolute: 0.1 10*3/uL (ref 0–0.7)
Eosinophils Relative: 1 %
HEMATOCRIT: 35.9 % — AB (ref 40.0–52.0)
HEMOGLOBIN: 11.5 g/dL — AB (ref 13.0–18.0)
Lymphocytes Relative: 11 %
Lymphs Abs: 0.5 10*3/uL — ABNORMAL LOW (ref 1.0–3.6)
MCH: 28.6 pg (ref 26.0–34.0)
MCHC: 32.1 g/dL (ref 32.0–36.0)
MCV: 89.2 fL (ref 80.0–100.0)
MONO ABS: 0.3 10*3/uL (ref 0.2–1.0)
Monocytes Relative: 5 %
NEUTROS ABS: 3.9 10*3/uL (ref 1.4–6.5)
NEUTROS PCT: 83 %
Platelets: 53 10*3/uL — ABNORMAL LOW (ref 150–440)
RBC: 4.02 MIL/uL — ABNORMAL LOW (ref 4.40–5.90)
RDW: 17.8 % — AB (ref 11.5–14.5)
WBC: 4.7 10*3/uL (ref 3.8–10.6)

## 2017-12-25 LAB — COMPREHENSIVE METABOLIC PANEL
ALT: 54 U/L (ref 17–63)
ANION GAP: 5 (ref 5–15)
AST: 66 U/L — ABNORMAL HIGH (ref 15–41)
Albumin: 3.3 g/dL — ABNORMAL LOW (ref 3.5–5.0)
Alkaline Phosphatase: 126 U/L (ref 38–126)
BUN: 37 mg/dL — ABNORMAL HIGH (ref 6–20)
CHLORIDE: 110 mmol/L (ref 101–111)
CO2: 33 mmol/L — ABNORMAL HIGH (ref 22–32)
Calcium: 9.6 mg/dL (ref 8.9–10.3)
Creatinine, Ser: 0.53 mg/dL — ABNORMAL LOW (ref 0.61–1.24)
Glucose, Bld: 94 mg/dL (ref 65–99)
POTASSIUM: 4.4 mmol/L (ref 3.5–5.1)
SODIUM: 148 mmol/L — AB (ref 135–145)
Total Bilirubin: 0.5 mg/dL (ref 0.3–1.2)
Total Protein: 7.8 g/dL (ref 6.5–8.1)

## 2017-12-25 LAB — GLUCOSE, CAPILLARY: GLUCOSE-CAPILLARY: 99 mg/dL (ref 65–99)

## 2017-12-25 LAB — BLOOD GAS, VENOUS
Acid-Base Excess: 9.8 mmol/L — ABNORMAL HIGH (ref 0.0–2.0)
Bicarbonate: 38.5 mmol/L — ABNORMAL HIGH (ref 20.0–28.0)
O2 Saturation: 65.9 %
PATIENT TEMPERATURE: 37
PH VEN: 7.33 (ref 7.250–7.430)
pCO2, Ven: 73 mmHg (ref 44.0–60.0)
pO2, Ven: 37 mmHg (ref 32.0–45.0)

## 2017-12-25 LAB — LIPASE, BLOOD: Lipase: 30 U/L (ref 11–51)

## 2017-12-25 LAB — LACTIC ACID, PLASMA
LACTIC ACID, VENOUS: 0.9 mmol/L (ref 0.5–1.9)
Lactic Acid, Venous: 0.6 mmol/L (ref 0.5–1.9)

## 2017-12-25 LAB — PROCALCITONIN

## 2017-12-25 LAB — PROTIME-INR
INR: 1.14
Prothrombin Time: 14.5 seconds (ref 11.4–15.2)

## 2017-12-25 LAB — TSH: TSH: 1.109 u[IU]/mL (ref 0.350–4.500)

## 2017-12-25 LAB — BRAIN NATRIURETIC PEPTIDE: B Natriuretic Peptide: 231 pg/mL — ABNORMAL HIGH (ref 0.0–100.0)

## 2017-12-25 LAB — MRSA PCR SCREENING: MRSA by PCR: POSITIVE — AB

## 2017-12-25 LAB — TROPONIN I

## 2017-12-25 LAB — VALPROIC ACID LEVEL

## 2017-12-25 MED ORDER — BUDESONIDE 0.25 MG/2ML IN SUSP
0.2500 mg | Freq: Two times a day (BID) | RESPIRATORY_TRACT | Status: DC
  Administered 2017-12-25 – 2017-12-26 (×2): 0.25 mg via RESPIRATORY_TRACT
  Filled 2017-12-25 (×2): qty 2

## 2017-12-25 MED ORDER — IPRATROPIUM-ALBUTEROL 0.5-2.5 (3) MG/3ML IN SOLN
3.0000 mL | RESPIRATORY_TRACT | Status: DC
  Administered 2017-12-25 – 2017-12-26 (×4): 3 mL via RESPIRATORY_TRACT
  Filled 2017-12-25 (×4): qty 3

## 2017-12-25 MED ORDER — DEXTROSE 5 % IV SOLN
2.0000 g | Freq: Once | INTRAVENOUS | Status: AC
  Administered 2017-12-25: 2 g via INTRAVENOUS
  Filled 2017-12-25: qty 2

## 2017-12-25 MED ORDER — HEPARIN SODIUM (PORCINE) 5000 UNIT/ML IJ SOLN
5000.0000 [IU] | Freq: Three times a day (TID) | INTRAMUSCULAR | Status: DC
  Administered 2017-12-25 – 2017-12-26 (×2): 5000 [IU] via SUBCUTANEOUS
  Filled 2017-12-25 (×2): qty 1

## 2017-12-25 MED ORDER — LIDOCAINE HCL 2 % EX GEL
CUTANEOUS | Status: AC
  Filled 2017-12-25: qty 10

## 2017-12-25 MED ORDER — METHYLPREDNISOLONE SODIUM SUCC 125 MG IJ SOLR
60.0000 mg | Freq: Four times a day (QID) | INTRAMUSCULAR | Status: DC
  Administered 2017-12-25 – 2017-12-26 (×3): 60 mg via INTRAVENOUS
  Filled 2017-12-25 (×2): qty 2

## 2017-12-25 MED ORDER — VANCOMYCIN HCL 10 G IV SOLR
2000.0000 mg | INTRAVENOUS | Status: AC
  Administered 2017-12-25: 2000 mg via INTRAVENOUS
  Filled 2017-12-25: qty 2000

## 2017-12-25 MED ORDER — VANCOMYCIN HCL IN DEXTROSE 1-5 GM/200ML-% IV SOLN
1000.0000 mg | Freq: Once | INTRAVENOUS | Status: DC
  Filled 2017-12-25: qty 200

## 2017-12-25 MED ORDER — SODIUM CHLORIDE 0.9 % IV BOLUS (SEPSIS)
1000.0000 mL | Freq: Once | INTRAVENOUS | Status: AC
  Administered 2017-12-25: 1000 mL via INTRAVENOUS

## 2017-12-25 MED ORDER — LEVOFLOXACIN IN D5W 750 MG/150ML IV SOLN
750.0000 mg | INTRAVENOUS | Status: DC
  Administered 2017-12-26 – 2017-12-28 (×3): 750 mg via INTRAVENOUS
  Filled 2017-12-25 (×4): qty 150

## 2017-12-25 MED ORDER — AZTREONAM 1 G IJ SOLR
1.0000 g | Freq: Three times a day (TID) | INTRAMUSCULAR | Status: DC
  Administered 2017-12-25 – 2017-12-26 (×2): 1 g via INTRAVENOUS
  Filled 2017-12-25 (×4): qty 1

## 2017-12-25 MED ORDER — VANCOMYCIN HCL 10 G IV SOLR
1500.0000 mg | Freq: Three times a day (TID) | INTRAVENOUS | Status: DC
  Administered 2017-12-26 – 2017-12-27 (×4): 1500 mg via INTRAVENOUS
  Filled 2017-12-25 (×5): qty 1500

## 2017-12-25 MED ORDER — CHLORHEXIDINE GLUCONATE 0.12 % MT SOLN
15.0000 mL | Freq: Two times a day (BID) | OROMUCOSAL | Status: DC
  Administered 2017-12-25 – 2017-12-30 (×10): 15 mL via OROMUCOSAL
  Filled 2017-12-25 (×7): qty 15

## 2017-12-25 MED ORDER — ORAL CARE MOUTH RINSE
15.0000 mL | Freq: Two times a day (BID) | OROMUCOSAL | Status: DC
  Administered 2017-12-26 – 2017-12-29 (×6): 15 mL via OROMUCOSAL

## 2017-12-25 MED ORDER — NOREPINEPHRINE BITARTRATE 1 MG/ML IV SOLN
0.0000 ug/min | INTRAVENOUS | Status: DC
  Administered 2017-12-25: 2 ug/min via INTRAVENOUS
  Administered 2017-12-26: 8 ug/min via INTRAVENOUS
  Filled 2017-12-25 (×2): qty 4

## 2017-12-25 MED ORDER — DOCUSATE SODIUM 100 MG PO CAPS: 100.0000 mg | ORAL_CAPSULE | Freq: Two times a day (BID) | ORAL | Status: DC | PRN

## 2017-12-25 MED ORDER — LEVOFLOXACIN IN D5W 750 MG/150ML IV SOLN
750.0000 mg | Freq: Once | INTRAVENOUS | Status: AC
  Administered 2017-12-25: 750 mg via INTRAVENOUS
  Filled 2017-12-25: qty 150

## 2017-12-25 NOTE — ED Notes (Signed)
Allison RN, aware of bed assigned  

## 2017-12-25 NOTE — H&P (Signed)
[facility] [place] NAME: Jerry [name]    MR#:  [id number]  DATE OF BIRTH:  Jan 18, 1956  DATE OF ADMISSION:  12/25/2017  PRIMARY CARE PHYSICIAN: [facility]   REQUESTING/REFERRING PHYSICIAN: Marcos Eke  CHIEF COMPLAINT:   Chief Complaint  Patient presents with  . Altered Mental Status    HISTORY OF PRESENT ILLNESS: Jerry [name]  is a 62 y.o. male with a known history of BPH, CHF, COPD, hypertension, rheumatoid arthritis, schizophrenia, thyroid disease- is a nursing home resident and sent with sepsis. Patient is drowsy and not able to give any history. Is noted to be hypoxic, hypotensive, hypothermic in ER. Started on BiPAP in ER, he suggested to admit.  PAST MEDICAL HISTORY:   Past Medical History:  Diagnosis Date  . BPH (benign prostatic hyperplasia)   . CHF (congestive heart failure) ([place])   . Chronic pain   . COPD (chronic obstructive pulmonary disease) ([place])   . Hypertension   . Morbid obesity ([place])   . Pressure ulcer   . Rheumatoid arthritis ([place])   . Schizophrenia ([place])   . Thyroid disease     PAST SURGICAL HISTORY: History reviewed. No pertinent surgical history.  SOCIAL HISTORY:  Social History   Tobacco Use  . Smoking status: Never Smoker  . Smokeless tobacco: Never Used  Substance Use Topics  . Alcohol use: No    FAMILY HISTORY: No family history on file.  DRUG ALLERGIES:  Allergies  Allergen Reactions  . Methadone   . Penicillins     .Has patient had a PCN reaction causing immediate rash, facial/tongue/throat swelling, SOB or lightheadedness with hypotension: Unknown Has patient had a PCN reaction causing severe rash involving mucus membranes or skin necrosis: Unknown Has patient had a PCN reaction that required hospitalization: Unknown Has patient had a PCN reaction occurring within the last 10 years: Unknown If all of the above answers are "NO", then may proceed with Cephalosporin use.   Marland Kitchen  Propoxyphene   . Sulfa Antibiotics   . Valproic Acid And Related     Thrombocytopenia    REVIEW OF SYSTEMS:   Patient is drowsy and not able to get review of system.  MEDICATIONS AT HOME:  Prior to Admission medications   Medication Sig Start Date End Date Taking? Authorizing Provider  albuterol (PROVENTIL HFA;VENTOLIN HFA) 108 (90 Base) MCG/ACT inhaler Inhale 1 puff into the lungs every 3 (three) hours as needed for wheezing or shortness of breath.    [provider]  amLODipine (NORVASC) 10 MG tablet Take 10 mg by mouth daily.    [provider]  benztropine (COGENTIN) 0.5 MG tablet Take 0.5 mg by mouth 2 (two) times daily.    [provider]  carvedilol (COREG) 25 MG tablet Take 25 mg by mouth 2 (two) times daily with a meal.    [provider]  clonazePAM (KLONOPIN) 1 MG tablet Take 1 tablet (1 mg total) by mouth at bedtime. 07/02/17   Max Sane, MD  cloNIDine (CATAPRES - DOSED IN MG/24 HR) 0.3 mg/24hr patch Place 0.3 mg onto the skin once a week. Tuesday    [provider]  docusate sodium (COLACE) 100 MG capsule Take 100 mg by mouth 2 (two) times daily.    [provider]  fluPHENAZine (PROLIXIN) 2.5 MG tablet Take 5 tablets (12.5 mg total) by mouth daily. Take 12.5 mg by mouth in the morning and 15 mg by mouth at bedtime. 07/02/17  Max Sane, MD  hydroxychloroquine (PLAQUENIL) 200 MG tablet Take 200 mg by mouth 2 (two) times daily.    [provider]  levothyroxine (SYNTHROID, LEVOTHROID) 200 MCG tablet Take 1 tablet (200 mcg total) by mouth daily before breakfast. 08/14/17   Epifanio Lesches, MD  methocarbamol (ROBAXIN) 750 MG tablet Take 750 mg by mouth at bedtime.    [provider]  Multiple Vitamins-Minerals (MULTIVITAMIN WITH MINERALS) tablet Take 1 tablet by mouth daily.    [provider]  Polyethylene Glycol 3350 (MIRALAX PO) Take 17 g by mouth daily.    [provider]  potassium  chloride (MICRO-K) 10 MEQ CR capsule Take 10 mEq by mouth daily.    [provider]  predniSONE (DELTASONE) 10 MG tablet Take 1 tablet (10 mg total) by mouth daily. 50 MG daily for 2 days 40 mg daily for 2 days 30 mg daily for 2 days 10 mg daily for 2 days and then stop 08/13/17   Epifanio Lesches, MD  protein supplement shake (PREMIER PROTEIN) LIQD Take 325 mLs (11 oz total) by mouth 3 (three) times daily between meals. 08/13/17   Epifanio Lesches, MD  tamsulosin (FLOMAX) 0.4 MG CAPS capsule Take 0.4 mg by mouth daily.    [provider]  torsemide (DEMADEX) 10 MG tablet Take 10 mg by mouth daily.    [provider]  traZODone (DESYREL) 150 MG tablet Take 150 mg by mouth at bedtime.    [provider]  vitamin C (ASCORBIC ACID) 250 MG tablet Take 500 mg by mouth 2 (two) times daily.    [provider]      PHYSICAL EXAMINATION:   VITAL SIGNS: Blood pressure (!) 88/69, pulse (!) 45, temperature (!) 87.3 F (30.7 C), resp. rate 11, weight (!) 137 kg (302 lb 0.5 oz), SpO2 96 %.  GENERAL:  63 y.o.-year-old patient lying in the bed with Acute critical appearing.  EYES: Pupils equal, round, reactive to light and accommodation. No scleral icterus. Extraocular muscles intact.  HEENT: Head atraumatic, normocephalic. Oropharynx and nasopharynx clear.  NECK:  Supple, no jugular venous distention. No thyroid enlargement, no tenderness.  LUNGS: Normal breath sounds bilaterally, no wheezing,bilateral crepitation. No use of accessory muscles of respiration. BiPAP in use for CARDIOVASCULAR: S1, S2 normal. No murmurs, rubs, or gallops.  ABDOMEN: Soft, nontender, nondistended. Bowel sounds present. No organomegaly or mass.  EXTREMITIES: No pedal edema, cyanosis, or clubbing.  NEUROLOGIC: patient is drowsy, slightly opens eyes to response, does not follow commands. PSYCHIATRIC: The patient is drowsy.  SKIN: No obvious rash, lesion, or ulcer.   LABORATORY  PANEL:   CBC Recent Labs  Lab 12/25/17 1538  WBC 4.7  HGB 11.5*  HCT 35.9*  PLT 53*  MCV 89.2  MCH 28.6  MCHC 32.1  RDW 17.8*  LYMPHSABS 0.5*  MONOABS 0.3  EOSABS 0.1  BASOSABS 0.0   ------------------------------------------------------------------------------------------------------------------  Chemistries  Recent Labs  Lab 12/25/17 1538  NA 148*  K 4.4  CL 110  CO2 33*  GLUCOSE 94  BUN 37*  CREATININE 0.53*  CALCIUM 9.6  AST 66*  ALT 54  ALKPHOS 126  BILITOT 0.5   ------------------------------------------------------------------------------------------------------------------ estimated creatinine clearance is 141 mL/min (A) (by C-G formula based on SCr of 0.53 mg/dL (L)). ------------------------------------------------------------------------------------------------------------------ No results for input(s): TSH, T4TOTAL, T3FREE, THYROIDAB in the last 72 hours.  Invalid input(s): FREET3   Coagulation profile Recent Labs  Lab 12/25/17 1538  INR 1.14   ------------------------------------------------------------------------------------------------------------------- No results for  input(s): DDIMER in the last 72 hours. -------------------------------------------------------------------------------------------------------------------  Cardiac Enzymes Recent Labs  Lab 12/25/17 1538  TROPONINI <0.03   ------------------------------------------------------------------------------------------------------------------ Invalid input(s): POCBNP  ---------------------------------------------------------------------------------------------------------------  Urinalysis    Component Value Date/Time   COLORURINE YELLOW (A) 12/25/2017 1601   APPEARANCEUR HAZY (A) 12/25/2017 1601   APPEARANCEUR Hazy 01/19/2013 1635   LABSPEC 1.018 12/25/2017 1601   LABSPEC 1.008 01/19/2013 1635   PHURINE 5.0 12/25/2017 1601   GLUCOSEU NEGATIVE 12/25/2017 1601    GLUCOSEU Negative 01/19/2013 1635   HGBUR NEGATIVE 12/25/2017 1601   BILIRUBINUR NEGATIVE 12/25/2017 1601   BILIRUBINUR Negative 01/19/2013 1635   KETONESUR NEGATIVE 12/25/2017 1601   PROTEINUR NEGATIVE 12/25/2017 1601   NITRITE POSITIVE (A) 12/25/2017 1601   LEUKOCYTESUR LARGE (A) 12/25/2017 1601   LEUKOCYTESUR Negative 01/19/2013 1635     RADIOLOGY: Ct Head Wo Contrast  Result Date: 12/25/2017 CLINICAL DATA:  The altered mental status, hypothermia EXAM: CT HEAD WITHOUT CONTRAST TECHNIQUE: Contiguous axial images were obtained from the base of the skull through the vertex without intravenous contrast. COMPARISON:  08/02/2017 FINDINGS: Brain: The the There is atrophy and chronic small vessel disease changes. No acute intracranial abnormality. Specifically, no hemorrhage, hydrocephalus, mass lesion, acute infarction, or significant intracranial injury. Vascular: No hyperdense vessel or unexpected calcification. Skull: No acute calvarial abnormality. Sinuses/Orbits: Visualized paranasal sinuses and mastoids clear. Orbital soft tissues unremarkable. Other: None IMPRESSION: No acute intracranial abnormality. Atrophy, chronic microvascular disease. Electronically Signed   By: Rolm Baptise M.D.   On: 12/25/2017 17:05   Dg Chest Portable 1 View  Result Date: 12/25/2017 CLINICAL DATA:  Altered mental status, hypothermic EXAM: PORTABLE CHEST 1 VIEW COMPARISON:  Portable chest x-ray of 08/03/2017 FINDINGS: The lungs are poorly aerated. Patchy opacities are noted bilaterally some of which may represent atelectasis, but developing pneumonia or edema cannot be excluded. No pleural effusion is seen. Mediastinal and hilar contours are relatively stable on this semi-erect film done portably. Mild cardiomegaly is stable. No bony abnormality is seen. IMPRESSION: Stable cardiomegaly with patchy opacities bilaterally. Cannot exclude developing pneumonia or possibly edema. Recommend follow-up. Electronically Signed    By: Ivar Drape M.D.   On: 12/25/2017 16:20    EKG: Orders placed or performed during the hospital encounter of 12/25/17  . EKG 12-Lead  . EKG 12-Lead  . EKG 12-Lead  . EKG 12-Lead  . EKG 12-Lead  . EKG 12-Lead    IMPRESSION AND PLAN:  * sepsis   Healthcare associated pneumonia  IV fluid boluses, Levophed IV drip, broad-spectrum antibiotic, cultures are sent.  Monitor in ICU.  * acute on chronic respiratory failure   COPD   IV steroid, nebulizers, inhaled steroid.  * Htn   Hold meds for now  * chronic diastolic CHF   Monitor for now with IV fluids.  * altered mental status   Due to respiratory failure, continue monitoring.  * hypothermia   Due to sepsis, continue to monitor.  * hypothyroidism   Check TSH.  All the records are reviewed and case discussed with ED provider. Management plans discussed with the patient, family and they are in agreement.  CODE STATUS:full code. Code Status History    Date Active Date Inactive Code Status Order ID Comments User Context   08/02/2017 14:47 08/13/2017 18:44 Full Code [id number]  Bettey Costa, MD Inpatient   06/21/2017 15:27 07/02/2017 16:28 Full Code [id number]  Theodoro Grist, MD Inpatient     Discussed with ER physician and intensivist.  TOTAL TIME TAKING CARE  OF THIS PATIENT: 50 critical care minutes.    Vaughan Basta M.D on 12/25/2017   Between 7am to 6pm - Pager - [phone]  After 6pm go to [email or web address] - password [email or web address] [facility]  Office  [phone]  CC: Primary care physician; [facility]   Note: This dictation was prepared with Dragon dictation along with smaller phrase technology. Any transcriptional errors that result from this process are unintentional.

## 2017-12-25 NOTE — Progress Notes (Signed)
ANTIBIOTIC CONSULT NOTE - INITIAL  Pharmacy Consult for Vancomycin, Levofloxacin, and Aztreona Indication: sepsis  Allergies  Allergen Reactions  . Methadone   . Penicillins     .Has patient had a PCN reaction causing immediate rash, facial/tongue/throat swelling, SOB or lightheadedness with hypotension: Unknown Has patient had a PCN reaction causing severe rash involving mucus membranes or skin necrosis: Unknown Has patient had a PCN reaction that required hospitalization: Unknown Has patient had a PCN reaction occurring within the last 10 years: Unknown If all of the above answers are "NO", then may proceed with Cephalosporin use.   Marland Kitchen Propoxyphene   . Sulfa Antibiotics   . Valproic Acid And Related     Thrombocytopenia    Patient Measurements: Weight: (!) 302 lb 0.5 oz (137 kg) Adjusted Body Weight: 104  Vital Signs: Temp: 90.4 F (32.4 C) (01/29 1900) Temp Source: Rectal (01/29 1534) BP: 102/74 (01/29 1900) Pulse Rate: 50 (01/29 1900) Intake/Output from previous day: No intake/output data recorded. Intake/Output from this shift: No intake/output data recorded.  Labs: Recent Labs    12/25/17 1538  WBC 4.7  HGB 11.5*  PLT 53*  CREATININE 0.53*   Estimated Creatinine Clearance: 141 mL/min (A) (by C-G formula based on SCr of 0.53 mg/dL (L)). No results for input(s): VANCOTROUGH, VANCOPEAK, VANCORANDOM, GENTTROUGH, GENTPEAK, GENTRANDOM, TOBRATROUGH, TOBRAPEAK, TOBRARND, AMIKACINPEAK, AMIKACINTROU, AMIKACIN in the last 72 hours.   Microbiology: No results found for this or any previous visit (from the past 720 hour(s)).  Medical History: Past Medical History:  Diagnosis Date  . BPH (benign prostatic hyperplasia)   . CHF (congestive heart failure) ([place])   . Chronic pain   . COPD (chronic obstructive pulmonary disease) ([place])   . Hypertension   . Morbid obesity ([place])   . Pressure ulcer   . Rheumatoid arthritis ([place])   . Schizophrenia ([place])   . Thyroid disease      Medications:   (Not in a hospital admission) Scheduled:  . budesonide (PULMICORT) nebulizer solution  0.25 mg Nebulization BID  . ipratropium-albuterol  3 mL Nebulization Q4H  . lidocaine      . methylPREDNISolone (SOLU-MEDROL) injection  60 mg Intravenous Q6H   Infusions:  . [START ON 12/26/2017] aztreonam    . [START ON 12/26/2017] levofloxacin (LEVAQUIN) IV    . norepinephrine (LEVOPHED) Adult infusion 3 mcg/min (12/25/17 1823)  . [START ON 12/26/2017] vancomycin    . vancomycin 2,000 mg (12/25/17 1732)   Assessment: Pharmacy to dose and monitor Vancomycin, Azactam, and levofloxacin in this 62 year old obese patient.   Goal of Therapy:  Vancomycin trough level 15-20 mcg/ml  Plan:  Vancomycin: Will give Vancomycin 2 g IV x 1 then Vancomycin 1500 mg IV q8hours. Trough level prior to the 4th dose of regimen.  Azactam: will start azactam 1 g IV q8 hours  Levofloxacin: Will start levofloxacin 750 mg IV q24 hours.   Rasheda Ledger D 12/25/2017,7:17 PM

## 2017-12-25 NOTE — ED Notes (Signed)
Pt placed on zoll and pads

## 2017-12-25 NOTE — ED Triage Notes (Addendum)
Pt to ED via EMS from [facility] with c/o AMS this afternoon , pt was found to be hypothermic of 86 orally, brady on monitor HR 40s per EMS. PT follows commands per EMS. Hx of sepsis from pneumonia . Per EMS pt at baseline is A&Ox4

## 2017-12-25 NOTE — ED Notes (Signed)
Bear hugger applied at this time.

## 2017-12-25 NOTE — ED Notes (Addendum)
Clonidine patch removed at this time on the RT chest

## 2017-12-25 NOTE — ED Notes (Signed)
Code Sepsis initiated.

## 2017-12-25 NOTE — ED Provider Notes (Signed)
[facility] Emergency Department Provider Note  ____________________________________________   First MD Initiated Contact with Patient 12/25/17 1540     (approximate)  I have reviewed the triage vital signs and the nursing notes.   HISTORY  Chief Complaint Altered Mental Status  Level 5 caveat:  history/ROS limited by acute/critical illness  HPI Jerry [name] is a 62 y.o. male who lives at [facility] but reportedly has a normal mental.  He presents by EMS for altered mental status, hypothermia, and bradycardia.  Reportedly he has done this in the past when he was septic and was hospitalized about 4 months ago with similar symptoms.  He is currently able to orient to a person speaking to him but is not able to answer any questions.  He had a rectal temperature of 86.8 degrees when he arrived which corresponded with an oral temperature by EMS.  He will follows simple commands but is not responding verbally.  No other additional history has been provided except that he has had pneumonia and urinary tract infections in the past and that he has a reported allergy to penicillins.   Past Medical History:  Diagnosis Date  . BPH (benign prostatic hyperplasia)   . CHF (congestive heart failure) ([place])   . Chronic pain   . COPD (chronic obstructive pulmonary disease) ([place])   . Hypertension   . Morbid obesity ([place])   . Pressure ulcer   . Rheumatoid arthritis ([place])   . Schizophrenia ([place])   . Thyroid disease     Patient Active Problem List   Diagnosis Date Noted  . Schizophrenia ([place])   . Sepsis ([place])   . Palliative care by specialist   . Advance care planning   . Goals of care, counseling/discussion   . CHF (congestive heart failure) ([place])   . Altered mental status   . Septic shock ([place]) 08/02/2017  . Thrombocytopenia ([place]) 06/21/2017  . Leukopenia 06/21/2017  . Pressure injury of skin 06/21/2017    History reviewed. No pertinent surgical  history.  Prior to Admission medications   Medication Sig Start Date End Date Taking? Authorizing Provider  amLODipine (NORVASC) 10 MG tablet Take 10 mg by mouth daily.   Yes [provider]  benztropine (COGENTIN) 0.5 MG tablet Take 0.5 mg by mouth 2 (two) times daily.   Yes [provider]  carvedilol (COREG) 25 MG tablet Take 25 mg by mouth 2 (two) times daily with a meal.   Yes [provider]  clonazePAM (KLONOPIN) 1 MG tablet Take 1 tablet (1 mg total) by mouth at bedtime. 07/02/17  Yes Max Sane, MD  cloNIDine (CATAPRES - DOSED IN MG/24 HR) 0.3 mg/24hr patch Place 0.3 mg onto the skin once a week. Tuesday   Yes [provider]  docusate sodium (COLACE) 100 MG capsule Take 100 mg by mouth 2 (two) times daily.   Yes [provider]  fluPHENAZine (PROLIXIN) 2.5 MG tablet Take 5 tablets (12.5 mg total) by mouth daily. Take 12.5 mg by mouth in the morning and 15 mg by mouth at bedtime. Patient taking differently: Take 15 mg by mouth 2 (two) times daily.  07/02/17  Yes Max Sane, MD  hydroxychloroquine (PLAQUENIL) 200 MG tablet Take 200 mg by mouth 2 (two) times daily.   Yes [provider]  levothyroxine (SYNTHROID, LEVOTHROID) 200 MCG tablet Take 1 tablet (200 mcg total) by mouth daily before breakfast. Patient taking differently: Take 250 mcg by mouth daily before breakfast.  08/14/17  Yes Epifanio Lesches, MD  methocarbamol (ROBAXIN) 750 MG tablet Take 750 mg by mouth at bedtime.   Yes [provider]  Multiple Vitamins-Minerals (MULTIVITAMIN WITH MINERALS) tablet Take 1 tablet by mouth daily.   Yes [provider]  Polyethylene Glycol 3350 (MIRALAX PO) Take 17 g by mouth daily.   Yes [provider]  potassium chloride (MICRO-K) 10 MEQ CR capsule Take 10 mEq by mouth daily.   Yes [provider]  tamsulosin (FLOMAX) 0.4 MG CAPS capsule Take 0.4 mg by mouth daily.   Yes [provider]   torsemide (DEMADEX) 10 MG tablet Take 10 mg by mouth daily.   Yes [provider]  traZODone (DESYREL) 150 MG tablet Take 150 mg by mouth at bedtime.   Yes [provider]  vitamin C (ASCORBIC ACID) 250 MG tablet Take 500 mg by mouth 2 (two) times daily.   Yes [provider]  albuterol (PROVENTIL HFA;VENTOLIN HFA) 108 (90 Base) MCG/ACT inhaler Inhale 1 puff into the lungs every 3 (three) hours as needed for wheezing or shortness of breath.    [provider]  predniSONE (DELTASONE) 10 MG tablet Take 1 tablet (10 mg total) by mouth daily. 50 MG daily for 2 days 40 mg daily for 2 days 30 mg daily for 2 days 10 mg daily for 2 days and then stop Patient not taking: Reported on 12/25/2017 08/13/17   Epifanio Lesches, MD  protein supplement shake (PREMIER PROTEIN) LIQD Take 325 mLs (11 oz total) by mouth 3 (three) times daily between meals. 08/13/17   Epifanio Lesches, MD    Allergies Methadone; Penicillins; Propoxyphene; Sulfa antibiotics; and Valproic acid and related  No family history on file.  Social History Social History   Tobacco Use  . Smoking status: Never Smoker  . Smokeless tobacco: Never Used  Substance Use Topics  . Alcohol use: No  . Drug use: No    Review of Systems Level 5 caveat:  history/ROS limited by acute/critical illness  ____________________________________________   PHYSICAL EXAM:  VITAL SIGNS: ED Triage Vitals [12/25/17 1534]  Enc Vitals Group     BP      Pulse Rate (!) 48     Resp 20     Temp (!) 86.8 F (30.4 C)     Temp Source Rectal     SpO2 98 %     Weight (!) 137 kg (302 lb 0.5 oz)     Height      Head Circumference      [facility]      Pain Score 0     Pain Loc      Pain Edu?      Excl. in [place]?     Constitutional: Awake and follows simple commands but otherwise is altered and minimally responsive Eyes: Conjunctivae are normal.  Pupils are pinpoint and minimally reactive Head:  Atraumatic. Nose: No congestion/rhinnorhea. Neck: No stridor.  No meningeal signs.   Cardiovascular: Bradycardia in the 40s, regular rhythm. Grossly normal heart sounds, limited by body habitus Respiratory: Normal respiratory effort.  No retractions. Lungs CTAB. Gastrointestinal: Morbid obesity.  Soft and nontender. No distention.  Musculoskeletal: No lower extremity tenderness nor edema. No gross deformities of extremities. Neurologic: Unable to participate in neurological exam but has no obvious gross neurological deficits Skin:  Skin is cool, dry and intact. No rash noted.  ____________________________________________   LABS (all labs ordered are listed, but only abnormal results are displayed)  Labs Reviewed  COMPREHENSIVE METABOLIC PANEL - Abnormal; Notable for the following components:      Result Value   Sodium 148 (*)    CO2 33 (*)    BUN 37 (*)    Creatinine, Ser 0.53 (*)    Albumin 3.3 (*)    AST 66 (*)    All other components within normal limits  CBC WITH DIFFERENTIAL/PLATELET - Abnormal; Notable for the following components:   RBC 4.02 (*)    Hemoglobin 11.5 (*)    HCT 35.9 (*)    RDW 17.8 (*)    Platelets 53 (*)    Lymphs Abs 0.5 (*)    All other components within normal limits  URINALYSIS, COMPLETE (UACMP) WITH MICROSCOPIC - Abnormal; Notable for the following components:   Color, Urine YELLOW (*)    APPearance HAZY (*)    Nitrite POSITIVE (*)    Leukocytes, UA LARGE (*)    Bacteria, UA MANY (*)    Squamous Epithelial / LPF 0-5 (*)    All other components within normal limits  BRAIN NATRIURETIC PEPTIDE - Abnormal; Notable for the following components:   B Natriuretic Peptide 231.0 (*)    All other components within normal limits  VALPROIC ACID LEVEL - Abnormal; Notable for the following components:   Valproic Acid Lvl <10 (*)    All other components within normal limits  BLOOD GAS, VENOUS - Abnormal; Notable for the following components:   pCO2, Ven 73  (*)    Bicarbonate 38.5 (*)    Acid-Base Excess 9.8 (*)    All other components within normal limits  CULTURE, BLOOD (ROUTINE X 2)  CULTURE, BLOOD (ROUTINE X 2)  URINE CULTURE  LACTIC ACID, PLASMA  PROTIME-INR  LIPASE, BLOOD  TROPONIN I  PROCALCITONIN  LACTIC ACID, PLASMA  TSH   ____________________________________________  EKG  ED ECG REPORT I, Hinda Kehr, the attending physician, personally viewed and interpreted this ECG.  Date: 12/25/2017 EKG Time: 15: 42 Rate: 47 Rhythm: Sinus bradycardia versus junctional rhythm, difficult to appreciate given significant artifact QRS Axis: normal Intervals: Difficult to appreciate given the artifact that is present ST/T Wave abnormalities: Non-specific ST segment / T-wave changes, but no evidence of acute ischemia. Narrative Interpretation: no evidence of acute ischemia   ____________________________________________  RADIOLOGY I, Hinda Kehr, personally viewed and evaluated these images (plain radiographs) as part of my medical decision making, as well as reviewing the written report by the radiologist.  ED MD interpretation: Patchy opacities throughout concerning for pneumonia versus fluid, awaiting radiology interpretation   Official radiology report(s): Ct Head Wo Contrast  Result Date: 12/25/2017 CLINICAL DATA:  The altered mental status, hypothermia EXAM: CT HEAD WITHOUT CONTRAST TECHNIQUE: Contiguous axial images were obtained from the base of the skull through the vertex without intravenous contrast. COMPARISON:  08/02/2017 FINDINGS: Brain: The the There is atrophy and chronic small vessel disease changes. No acute intracranial abnormality. Specifically, no hemorrhage, hydrocephalus, mass lesion, acute infarction, or significant intracranial injury. Vascular: No hyperdense vessel or unexpected calcification. Skull: No acute calvarial abnormality. Sinuses/Orbits: Visualized paranasal sinuses and mastoids clear. Orbital soft  tissues unremarkable. Other: None IMPRESSION: No acute intracranial abnormality. Atrophy, chronic microvascular disease. Electronically Signed   By: Rolm Baptise M.D.   On: 12/25/2017 17:05   Dg Chest Portable 1 View  Result Date: 12/25/2017 CLINICAL DATA:  Altered mental status, hypothermic EXAM: PORTABLE CHEST 1 VIEW COMPARISON:  Portable chest x-ray of 08/03/2017 FINDINGS: The lungs are poorly aerated. Patchy  opacities are noted bilaterally some of which may represent atelectasis, but developing pneumonia or edema cannot be excluded. No pleural effusion is seen. Mediastinal and hilar contours are relatively stable on this semi-erect film done portably. Mild cardiomegaly is stable. No bony abnormality is seen. IMPRESSION: Stable cardiomegaly with patchy opacities bilaterally. Cannot exclude developing pneumonia or possibly edema. Recommend follow-up. Electronically Signed   By: Ivar Drape M.D.   On: 12/25/2017 16:20    ____________________________________________   PROCEDURES  Critical Care performed: Yes, see critical care procedure note(s)   Procedure(s) performed:   .Critical Care Performed by: Hinda Kehr, MD Authorized by: Hinda Kehr, MD   Critical care provider statement:    Critical care time (minutes):  45   Critical care time was exclusive of:  Separately billable procedures and treating other patients   Critical care was necessary to treat or prevent imminent or life-threatening deterioration of the following conditions:  Sepsis   Critical care was time spent personally by me on the following activities:  Development of treatment plan with patient or surrogate, discussions with consultants, evaluation of patient's response to treatment, examination of patient, obtaining history from patient or surrogate, ordering and performing treatments and interventions, ordering and review of laboratory studies, ordering and review of radiographic studies, pulse oximetry, re-evaluation  of patient's condition and review of old charts     ____________________________________________   INITIAL IMPRESSION / [place] / ED COURSE  As part of my medical decision making, I reviewed the following data within the [place] reviewed , EKG interpreted , Radiograph reviewed , Discussed with admitting physician  and Notes from prior ED visits    Differential diagnosis includes, but is not limited to, sepsis/septic shock, acute CVA or intracranial bleed, medication overdose or side effect, infections including both pneumonia and urinary tract infection, etc.  I will review the medical record but reportedly he has presented similarly in the past.  Based on his initial vital signs but particularly his hypothermia I have initiated code sepsis.  Once he is relatively stable and getting empiric treatment I will obtain a head CT without contrast to rule out any acute intracranial abnormalities such as bleeding.  I am treating empirically and will target ideal body weight rather than actual body weight for IV fluids.  Clinical Course as of Dec 25 1805  Tue Dec 25, 2017  1556 Viewed in the medical record the fact that the patient had a very similar presentation to this approximately 4 months ago, including the hypothermia (although it was not as profound), the bradycardia, and the altered mental status.  He was found to have a urinary tract infection at that time as well as an elevated valproic acid level.  I have added on a valproic acid level and proceeding with sepsis protocol including empiric antibiotics (alternate regimen for penicillin allergy).  He is morbidly obese so I will target ideal body weight for the 30 mL/kg goal  (body weight to be approximately 80 kg based on an estimated height and his current weight).  [CF]  7341 Respiratory therapy and informed me that the patient's VBG indicates a PCO2 of 73.  Although he is septic from both pneumonia and a  urinary tract infection, given that we know he is also hypercapnic I will put him on BiPAP.  He does have a history of being a difficult airway and we will attempt to avoid intubation if at all possible.  In spite of his  altered mental status, he is protecting his airway and oxygenating appropriately.  The lesion is likely a chronic issue given his body habitus  [CF]  1716 No evidence of acute intracranial issues.  Discussed the case with the hospitalist who will admit. CT Head Wo Contrast [CF]    Clinical Course User Index [CF] Hinda Kehr, MD    ____________________________________________  FINAL CLINICAL IMPRESSION(S) / ED DIAGNOSES  Final diagnoses:  Septic shock ([place])  HCAP (healthcare-associated pneumonia)  Altered mental status, unspecified altered mental status type  Hypothermia, initial encounter  Bradycardia  Urinary tract infection without hematuria, site unspecified  Acute on chronic respiratory failure with hypercapnia ([place])     MEDICATIONS GIVEN DURING THIS VISIT:  Medications  lidocaine (XYLOCAINE) 2 % jelly (not administered)  vancomycin (VANCOCIN) 2,000 mg in sodium chloride 0.9 % 500 mL IVPB (2,000 mg Intravenous New Bag/Given 12/25/17 1732)  norepinephrine (LEVOPHED) 4 mg in dextrose 5 % 250 mL (0.016 mg/mL) infusion (2 mcg/min Intravenous New Bag/Given 12/25/17 1725)  methylPREDNISolone sodium succinate (SOLU-MEDROL) 125 mg/2 mL injection 60 mg (not administered)  budesonide (PULMICORT) nebulizer solution 0.25 mg (not administered)  ipratropium-albuterol (DUONEB) 0.5-2.5 (3) MG/3ML nebulizer solution 3 mL (not administered)  sodium chloride 0.9 % bolus 1,000 mL (0 mLs Intravenous Stopped 12/25/17 1702)  levofloxacin (LEVAQUIN) IVPB 750 mg (750 mg Intravenous New Bag/Given 12/25/17 1626)  aztreonam (AZACTAM) 2 g in dextrose 5 % 50 mL IVPB (0 g Intravenous Stopped 12/25/17 1626)  sodium chloride 0.9 % bolus 1,000 mL (1,000 mLs Intravenous New Bag/Given 12/25/17 1703)      ED Discharge Orders    None       Note:  This document was prepared using Dragon voice recognition software and may include unintentional dictation errors.    Hinda Kehr, MD 12/25/17 [phone]

## 2017-12-25 NOTE — ED Notes (Signed)
Pt on bipap at this time , RT at bedside

## 2017-12-25 NOTE — Progress Notes (Signed)
CODE SEPSIS - PHARMACY COMMUNICATION  **Broad Spectrum Antibiotics should be administered within 1 hour of Sepsis diagnosis**  Time Code Sepsis Called/Page Received: 13:56  Antibiotics Ordered: Azactam, Vancomycin and Levofloxacin   Time of 1st antibiotic administration: 16:10   Larene Beach ,PharmD Clinical Pharmacist  12/25/2017  4:13 PM

## 2017-12-26 DIAGNOSIS — T68XXXA Hypothermia, initial encounter: Secondary | ICD-10-CM

## 2017-12-26 DIAGNOSIS — J9622 Acute and chronic respiratory failure with hypercapnia: Secondary | ICD-10-CM

## 2017-12-26 DIAGNOSIS — J9621 Acute and chronic respiratory failure with hypoxia: Secondary | ICD-10-CM

## 2017-12-26 DIAGNOSIS — I959 Hypotension, unspecified: Secondary | ICD-10-CM

## 2017-12-26 LAB — CBC
HCT: 32.4 % — ABNORMAL LOW (ref 40.0–52.0)
Hemoglobin: 10.6 g/dL — ABNORMAL LOW (ref 13.0–18.0)
MCH: 29.1 pg (ref 26.0–34.0)
MCHC: 32.6 g/dL (ref 32.0–36.0)
MCV: 89.3 fL (ref 80.0–100.0)
PLATELETS: 57 10*3/uL — AB (ref 150–440)
RBC: 3.63 MIL/uL — AB (ref 4.40–5.90)
RDW: 17.6 % — ABNORMAL HIGH (ref 11.5–14.5)
WBC: 7.7 10*3/uL (ref 3.8–10.6)

## 2017-12-26 LAB — GLUCOSE, CAPILLARY
GLUCOSE-CAPILLARY: 96 mg/dL (ref 65–99)
GLUCOSE-CAPILLARY: 80 mg/dL (ref 65–99)
Glucose-Capillary: 125 mg/dL — ABNORMAL HIGH (ref 65–99)

## 2017-12-26 LAB — PHOSPHORUS: PHOSPHORUS: 3.2 mg/dL (ref 2.5–4.6)

## 2017-12-26 LAB — BASIC METABOLIC PANEL
Anion gap: 7 (ref 5–15)
BUN: 35 mg/dL — ABNORMAL HIGH (ref 6–20)
CHLORIDE: 112 mmol/L — AB (ref 101–111)
CO2: 27 mmol/L (ref 22–32)
CREATININE: 0.86 mg/dL (ref 0.61–1.24)
Calcium: 8.9 mg/dL (ref 8.9–10.3)
GFR calc non Af Amer: >60 mL/min (ref >60)
Glucose, Bld: 84 mg/dL (ref 65–99)
POTASSIUM: 4.3 mmol/L (ref 3.5–5.1)
SODIUM: 146 mmol/L — AB (ref 135–145)

## 2017-12-26 LAB — MAGNESIUM: Magnesium: 2 mg/dL (ref 1.7–2.4)

## 2017-12-26 MED ORDER — HYDROCORTISONE NA SUCCINATE PF 100 MG IJ SOLR
50.0000 mg | Freq: Two times a day (BID) | INTRAMUSCULAR | Status: DC
  Administered 2017-12-27: 50 mg via INTRAVENOUS
  Filled 2017-12-26: qty 2

## 2017-12-26 MED ORDER — DEXTROSE 5 % IV SOLN
INTRAVENOUS | Status: DC
  Administered 2017-12-26: 12:00:00 via INTRAVENOUS
  Administered 2017-12-27: 50 mL via INTRAVENOUS

## 2017-12-26 MED ORDER — HYDROCORTISONE NA SUCCINATE PF 100 MG IJ SOLR
50.0000 mg | Freq: Four times a day (QID) | INTRAMUSCULAR | Status: DC
  Administered 2017-12-26 (×2): 50 mg via INTRAVENOUS
  Filled 2017-12-26 (×2): qty 2

## 2017-12-26 MED ORDER — INSULIN ASPART 100 UNIT/ML ~~LOC~~ SOLN
0.0000 [IU] | SUBCUTANEOUS | Status: DC
  Administered 2017-12-26: 2 [IU] via SUBCUTANEOUS
  Filled 2017-12-26 (×3): qty 1

## 2017-12-26 MED ORDER — BUDESONIDE 0.5 MG/2ML IN SUSP
0.5000 mg | Freq: Two times a day (BID) | RESPIRATORY_TRACT | Status: DC
  Administered 2017-12-26 – 2017-12-30 (×8): 0.5 mg via RESPIRATORY_TRACT
  Filled 2017-12-26 (×8): qty 2

## 2017-12-26 MED ORDER — IPRATROPIUM-ALBUTEROL 0.5-2.5 (3) MG/3ML IN SOLN
3.0000 mL | Freq: Four times a day (QID) | RESPIRATORY_TRACT | Status: DC
  Administered 2017-12-26 – 2017-12-30 (×16): 3 mL via RESPIRATORY_TRACT
  Filled 2017-12-26 (×15): qty 3

## 2017-12-26 MED ORDER — LEVOTHYROXINE SODIUM 100 MCG IV SOLR
125.0000 ug | Freq: Every day | INTRAVENOUS | Status: DC
  Administered 2017-12-26 – 2017-12-27 (×2): 125 ug via INTRAVENOUS
  Filled 2017-12-26 (×3): qty 10

## 2017-12-26 MED ORDER — DEXTROSE 5 % IV SOLN
2.0000 g | Freq: Two times a day (BID) | INTRAVENOUS | Status: DC
  Administered 2017-12-26 – 2017-12-27 (×3): 2 g via INTRAVENOUS
  Filled 2017-12-26 (×4): qty 2

## 2017-12-26 MED FILL — Medication: Qty: 1 | Status: AC

## 2017-12-26 NOTE — Consult Note (Signed)
PULMONARY / CRITICAL CARE MEDICINE   Name: Jerry [name] MRN: [id number] DOB: 09-Jan-1956    ADMISSION DATE:  12/25/2017   CONSULTATION DATE: 12/25/2017  REFERRING MD: Dr. Anselm Jungling  Reason: septic shock secondary to pneumonia  HISTORY OF PRESENT ILLNESS:   This is a 62 year old African-American male with a past medical history as indicated below who presented to the ED via [facility] with altered mental status.  At the ED, patient was found to be hypothermic with a core temperature of 86 F, bradycardic in the 40s, hypotensive with mean arterial pressure of 54 and hypoxic.  He was ruled in for sepsis secondary to pneumonia and was placed on BiPAP, started on IV antibiotics and norepinephrine.  He is being admitted to the ICU for further management Patient was recently hospitalized for sepsis and pneumonia PAST MEDICAL HISTORY :  He  has a past medical history of BPH (benign prostatic hyperplasia), CHF (congestive heart failure) ([place]), Chronic pain, COPD (chronic obstructive pulmonary disease) ([place]), Hypertension, Morbid obesity ([place]), Pressure ulcer, Rheumatoid arthritis ([place]), Schizophrenia ([place]), and Thyroid disease.  PAST SURGICAL HISTORY: He  has no past surgical history on file.  Allergies  Allergen Reactions  . Methadone   . Penicillins     .Has patient had a PCN reaction causing immediate rash, facial/tongue/throat swelling, SOB or lightheadedness with hypotension: Unknown Has patient had a PCN reaction causing severe rash involving mucus membranes or skin necrosis: Unknown Has patient had a PCN reaction that required hospitalization: Unknown Has patient had a PCN reaction occurring within the last 10 years: Unknown If all of the above answers are "NO", then may proceed with Cephalosporin use.   Marland Kitchen Propoxyphene   . Sulfa Antibiotics   . Valproic Acid And Related     Thrombocytopenia    No current facility-administered medications on file prior to encounter.     Current Outpatient Medications on File Prior to Encounter  Medication Sig  . amLODipine (NORVASC) 10 MG tablet Take 10 mg by mouth daily.  . benztropine (COGENTIN) 0.5 MG tablet Take 0.5 mg by mouth 2 (two) times daily.  . carvedilol (COREG) 25 MG tablet Take 25 mg by mouth 2 (two) times daily with a meal.  . clonazePAM (KLONOPIN) 1 MG tablet Take 1 tablet (1 mg total) by mouth at bedtime.  . cloNIDine (CATAPRES - DOSED IN MG/24 HR) 0.3 mg/24hr patch Place 0.3 mg onto the skin once a week. Tuesday  . docusate sodium (COLACE) 100 MG capsule Take 100 mg by mouth 2 (two) times daily.  . fluPHENAZine (PROLIXIN) 2.5 MG tablet Take 5 tablets (12.5 mg total) by mouth daily. Take 12.5 mg by mouth in the morning and 15 mg by mouth at bedtime. (Patient taking differently: Take 15 mg by mouth 2 (two) times daily. )  . hydroxychloroquine (PLAQUENIL) 200 MG tablet Take 200 mg by mouth 2 (two) times daily.  Marland Kitchen levothyroxine (SYNTHROID, LEVOTHROID) 200 MCG tablet Take 1 tablet (200 mcg total) by mouth daily before breakfast. (Patient taking differently: Take 250 mcg by mouth daily before breakfast. )  . methocarbamol (ROBAXIN) 750 MG tablet Take 750 mg by mouth at bedtime.  . Multiple Vitamins-Minerals (MULTIVITAMIN WITH MINERALS) tablet Take 1 tablet by mouth daily.  . Polyethylene Glycol 3350 (MIRALAX PO) Take 17 g by mouth daily.  . potassium chloride (MICRO-K) 10 MEQ CR capsule Take 10 mEq by mouth daily.  . tamsulosin (FLOMAX) 0.4 MG CAPS capsule Take 0.4 mg by mouth daily.  Marland Kitchen  torsemide (DEMADEX) 10 MG tablet Take 10 mg by mouth daily.  . traZODone (DESYREL) 150 MG tablet Take 150 mg by mouth at bedtime.  . vitamin C (ASCORBIC ACID) 250 MG tablet Take 500 mg by mouth 2 (two) times daily.  Marland Kitchen albuterol (PROVENTIL HFA;VENTOLIN HFA) 108 (90 Base) MCG/ACT inhaler Inhale 1 puff into the lungs every 3 (three) hours as needed for wheezing or shortness of breath.  . predniSONE (DELTASONE) 10 MG tablet Take 1  tablet (10 mg total) by mouth daily. 50 MG daily for 2 days 40 mg daily for 2 days 30 mg daily for 2 days 10 mg daily for 2 days and then stop (Patient not taking: Reported on 12/25/2017)  . protein supplement shake (PREMIER PROTEIN) LIQD Take 325 mLs (11 oz total) by mouth 3 (three) times daily between meals.    FAMILY HISTORY:  His has no family status information on file.    SOCIAL HISTORY: He  reports that  has never smoked. he has never used smokeless tobacco. He reports that he does not drink alcohol or use drugs.  REVIEW OF SYSTEMS:   Unable to obtain as patient is on continuous BiPAP and is somnolent  SUBJECTIVE:   VITAL SIGNS: BP 98/65   Pulse (!) 105   Temp 99.3 F (37.4 C)   Resp 17   Ht 6\' 2"  (1.88 m)   Wt 135.2 kg (298 lb 1 oz)   SpO2 97%   BMI 38.27 kg/m   HEMODYNAMICS:    VENTILATOR SETTINGS:    INTAKE / OUTPUT: I/O last 3 completed shifts: In: 1282.9 [I.V.:282.9; IV Piggyback:1000] Out: 700 [Urine:700]  PHYSICAL EXAMINATION: General: Morbidly obese, in moderate respiratory distress Neuro: Moans and groans, somnolent withdraws to pain, follows some basic commands HEENT: PERRLA, neck is short, trachea midline Cardiovascular: rrr, S1-S2, no murmur regurg or gallop, +2 pulses, +1 edema Lungs: Increased work of breathing, breath sounds diminished in the bases, no wheezes or rhonchi Abdomen: Obese, normal bowel sounds, palpation reveals no organomegaly Musculoskeletal: Positive range of motion in upper and lower extremities, no joint deformities Skin: Warm and dry, extensive venous stasis discoloration in bilateral lower extremities  LABS:  BMET Recent Labs  Lab 12/25/17 1538 12/26/17 0602  NA 148* 146*  K 4.4 4.3  CL 110 112*  CO2 33* 27  BUN 37* 35*  CREATININE 0.53* 0.86  GLUCOSE 94 84    Electrolytes Recent Labs  Lab 12/25/17 1538 12/26/17 0602  CALCIUM 9.6 8.9  MG  --  2.0  PHOS  --  3.2    CBC Recent Labs  Lab  12/25/17 1538 12/26/17 0602  WBC 4.7 7.7  HGB 11.5* 10.6*  HCT 35.9* 32.4*  PLT 53* 57*    Coag's Recent Labs  Lab 12/25/17 1538  INR 1.14    Sepsis Markers Recent Labs  Lab 12/25/17 1538 12/25/17 1802  LATICACIDVEN 0.9 0.6  PROCALCITON <0.10  --     ABG No results for input(s): PHART, PCO2ART, PO2ART in the last 168 hours.  Liver Enzymes Recent Labs  Lab 12/25/17 1538  AST 66*  ALT 54  ALKPHOS 126  BILITOT 0.5  ALBUMIN 3.3*    Cardiac Enzymes Recent Labs  Lab 12/25/17 1538  TROPONINI <0.03    Glucose Recent Labs  Lab 12/25/17 2101  GLUCAP 99    Imaging Ct Head Wo Contrast  Result Date: 12/25/2017 CLINICAL DATA:  The altered mental status, hypothermia EXAM: CT HEAD WITHOUT CONTRAST TECHNIQUE: Contiguous axial  images were obtained from the base of the skull through the vertex without intravenous contrast. COMPARISON:  08/02/2017 FINDINGS: Brain: The the There is atrophy and chronic small vessel disease changes. No acute intracranial abnormality. Specifically, no hemorrhage, hydrocephalus, mass lesion, acute infarction, or significant intracranial injury. Vascular: No hyperdense vessel or unexpected calcification. Skull: No acute calvarial abnormality. Sinuses/Orbits: Visualized paranasal sinuses and mastoids clear. Orbital soft tissues unremarkable. Other: None IMPRESSION: No acute intracranial abnormality. Atrophy, chronic microvascular disease. Electronically Signed   By: Rolm Baptise M.D.   On: 12/25/2017 17:05   Dg Chest Portable 1 View  Result Date: 12/25/2017 CLINICAL DATA:  Altered mental status, hypothermic EXAM: PORTABLE CHEST 1 VIEW COMPARISON:  Portable chest x-ray of 08/03/2017 FINDINGS: The lungs are poorly aerated. Patchy opacities are noted bilaterally some of which may represent atelectasis, but developing pneumonia or edema cannot be excluded. No pleural effusion is seen. Mediastinal and hilar contours are relatively stable on this  semi-erect film done portably. Mild cardiomegaly is stable. No bony abnormality is seen. IMPRESSION: Stable cardiomegaly with patchy opacities bilaterally. Cannot exclude developing pneumonia or possibly edema. Recommend follow-up. Electronically Signed   By: Ivar Drape M.D.   On: 12/25/2017 16:20   STUDIES:  None  CULTURES: Blood cultures x2 Urine culture  ANTIBIOTICS: Aztreonam 1/29> Levaquin 1/29 Vancomycin 1/29  SIGNIFICANT EVENTS: 01/29 admitted  LINES/TUBES: Peripheral IVs Foley catheter  DISCUSSION: 62 year old male with multiple comorbidities presenting with sepsis, septic shock, healthcare acquired pneumonia and acute hypoxic/hypercarbic respiratory failure  ASSESSMENT / PLAN: Acute hypoxic/hypercarbic respiratory failure HCAP Septic shock Sepsis secondary to pneumonia-patient remains hypothermic Acute encephalopathy secondary to sepsis and hypercarbia History of hypothyroidism, diastolic heart failure, schizophrenia, morbid obesity, BPH hypertension, and COPD  Hemodynamic monitoring per ICU protocol Pressors to maintain mean arterial blood pressure greater than 65 Antibiotics as above Hold off on IV fluids in light of CHF Strict I's and O's Continuous BiPAP and transition to nasal cannula as tolerated Apply warming blanket as needed to maintain: Temperature is 36 degrees and above Monitor and replace electrolytes GI and DVT prophylaxis   FAMILY  - Updates: No family at bedside; will update when available  - Inter-disciplinary family meet or Palliative Care meeting due by:  day 7   Magdalene S. [facility] ANP-BC Pulmonary and Critical Care Medicine [facility] Pager [phone] or [phone]  NB: This document was prepared using Dragon voice recognition software and may include unintentional dictation errors.   12/26/2017, 7:27 AM   PCCM ATTENDING ATTESTATION:  I have evaluated patient with the APP Jerry [name], reviewed database in its entirety and  discussed care plan in detail. In addition, this patient was discussed on multidisciplinary rounds.   This hospitalization is very similar to his prior hospitalization in July/August 2018.  At that time, he was admitted with presumed sepsis but no definitive infection was ever defined.  He was hypothermic and hypotensive during that hospitalization as well.  Important exam findings: Able to come off BiPAP this a.m. Comfortable on [place] O2 Cognition improved Able to come off vasopressors this a.m. HEENT WNL JVP cannot be visualized No wheezes or other adventitious sounds noted anteriorly Regular, no M Obese, soft, + BS Chronic stasis changes with symmetric edema  Major problems addressed by PCCM team: Acute hypoxemic/hypercarbic respiratory failure Possible HCAP Hypotension, resolved Acute encephalopathy, resolving Hypothermia, resolved   PLAN/REC: Continue supplemental oxygen as needed to maintain SPO2 greater than 90% Continue BiPAP as needed Continue nocturnal BiPAP for OSA Monitor off vasopressors, resume  norepinephrine as needed to maintain MAP >60 mmHg Monitor BMET intermittently Monitor I/Os Correct electrolytes as indicated  Monitor temp, WBC count Micro and abx as above DVT px: SQ heparin Monitor CBC intermittently Transfuse per usual guidelines   Merton Border, MD PCCM service Mobile [phone] Pager [phone] 12/26/2017 5:24 PM

## 2017-12-26 NOTE — Progress Notes (Signed)
[facility] [place] NAME: Jerry Jerry [name]    MR#:  [id number]  DATE OF BIRTH:  1956-10-04  SUBJECTIVE:  Came in from facility with altered mental status found to be hypothermic hypotensive in the ER  Patient awake alert however nonverbal.  Making some grunting noise REVIEW OF SYSTEMS:   Review of Systems  Unable to perform ROS: Mental acuity    DRUG ALLERGIES:   Allergies  Allergen Reactions  . Methadone   . Penicillins     Tolerates cephalosporins   . Propoxyphene   . Sulfa Antibiotics   . Valproic Acid And Related     Thrombocytopenia    VITALS:  Blood pressure 98/61, Jerry [name] 88, temperature (!) 95.9 F (35.5 C), resp. rate 10, height 6\' 2"  (1.88 m), weight 135.2 kg (298 lb 1 oz), SpO2 97 %.  PHYSICAL EXAMINATION:   Physical Exam  GENERAL:  62 y.o.-year-old patient lying in the bed with no acute distress.  Morbidly obese EYES: Pupils equal, round, reactive to light and accommodation. No scleral icterus. Extraocular muscles intact.  HEENT: Head atraumatic, normocephalic. Oropharynx and nasopharynx clear.  NECK:  Supple, no jugular venous distention. No thyroid enlargement, no tenderness.  LUNGS: Normal breath sounds bilaterally, no wheezing, rales, rhonchi. No use of accessory muscles of respiration.  CARDIOVASCULAR: S1, S2 normal. No murmurs, rubs, or gallops.  ABDOMEN: Soft, nontender, nondistended. Bowel sounds present. No organomegaly or mass.  EXTREMITIES: No cyanosis, clubbing or edema b/l.    NEUROLOGIC: Unable to assess since patient nonverbal PSYCHIATRIC:  patient is alert .  SKIN: No obvious rash, lesion, or ulcer.   LABORATORY PANEL:  CBC Recent Labs  Lab 12/26/17 0602  WBC 7.7  HGB 10.6*  HCT 32.4*  PLT 57*    Chemistries  Recent Labs  Lab 12/25/17 1538 12/26/17 0602  NA 148* 146*  K 4.4 4.3  CL 110 112*  CO2 33* 27  GLUCOSE 94 84  BUN 37* 35*  CREATININE 0.53* 0.86  CALCIUM 9.6 8.9  MG   --  2.0  AST 66*  --   ALT 54  --   ALKPHOS 126  --   BILITOT 0.5  --    Cardiac Enzymes Recent Labs  Lab 12/25/17 1538  TROPONINI <0.03   RADIOLOGY:  Ct Head Wo Contrast  Result Date: 12/25/2017 CLINICAL DATA:  The altered mental status, hypothermia EXAM: CT HEAD WITHOUT CONTRAST TECHNIQUE: Contiguous axial images were obtained from the base of the skull through the vertex without intravenous contrast. COMPARISON:  08/02/2017 FINDINGS: Brain: The the There is atrophy and chronic small vessel disease changes. No acute intracranial abnormality. Specifically, no hemorrhage, hydrocephalus, mass lesion, acute infarction, or significant intracranial injury. Vascular: No hyperdense vessel or unexpected calcification. Skull: No acute calvarial abnormality. Sinuses/Orbits: Visualized paranasal sinuses and mastoids clear. Orbital soft tissues unremarkable. Other: None IMPRESSION: No acute intracranial abnormality. Atrophy, chronic microvascular disease. Electronically Signed   By: Rolm Baptise M.D.   On: 12/25/2017 17:05   Dg Chest Portable 1 View  Result Date: 12/25/2017 CLINICAL DATA:  Altered mental status, hypothermic EXAM: PORTABLE CHEST 1 VIEW COMPARISON:  Portable chest x-ray of 08/03/2017 FINDINGS: The lungs are poorly aerated. Patchy opacities are noted bilaterally some of which may represent atelectasis, but developing pneumonia or edema cannot be excluded. No pleural effusion is seen. Mediastinal and hilar contours are relatively stable on this semi-erect film done portably. Mild cardiomegaly is stable. No bony abnormality is  seen. IMPRESSION: Stable cardiomegaly with patchy opacities bilaterally. Cannot exclude developing pneumonia or possibly edema. Recommend follow-up. Electronically Signed   By: Ivar Drape M.D.   On: 12/25/2017 16:20   ASSESSMENT AND PLAN:   Jerry Jerry [name]  is a 62 y.o. male with a known history of BPH, CHF, COPD, hypertension, rheumatoid arthritis, schizophrenia, thyroid  disease- is a nursing home resident and sent with sepsis. Patient is drowsy and not able to give any history.patient was noted to be hypoxic, hypotensive, hypothermic in ER.  1.  Sepsis secondary to healthcare associated pneumonia -Patient admitted to ICU started on sepsis protocol -Currently weaned off IV Levophed drip = IV Vanco Zosyn and Levaquin = Follow blood cultures -Appreciate ICU attending input  2.  Acute on chronic hypoxic respiratory failure secondary to COPD - Continue IV steroids, nebulizers  3.  Hypotension secondary to sepsis hold BP meds  4.  Chronic diastolic congestive heart failure Patient does not appear to be in heart failure present  5.  History of schizophrenia --At present patient is not verbal - do not Know what his baseline is   Case discussed with Care Management/Social Worker. Management plans discussed with the patient, family and they are in agreement.  CODE STATUS: Full DVT Prophylaxis: Lovenox  TOTAL TIME TAKING CARE OF THIS PATIENT: 30 minutes.  >50% time spent on counselling and coordination of care  POSSIBLE D/C IN few DAYS, DEPENDING ON CLINICAL CONDITION.  Note: This dictation was prepared with Dragon dictation along with smaller phrase technology. Any transcriptional errors that result from this process are unintentional.  Fritzi Mandes M.D on 12/26/2017 at 8:26 PM  Between 7am to 6pm - Pager - [phone]  After 6pm go to [email or web address] - Proofreader  [facility]  Office  [phone]  CC: Primary care physician; [facility], Doctors MakingPatient ID: Jerry Jerry [name], male   DOB: 1956-10-19, 62 y.o.   MRN: [id number]

## 2017-12-26 NOTE — Care Management (Signed)
Patient presenting from [facility] where he is under a long term plan of care.  Admitted with sepsis due to pneumonia requiring pressors.  CSW consult placed

## 2017-12-26 NOTE — NC FL2 (Addendum)
[place] LEVEL OF CARE SCREENING TOOL     IDENTIFICATION  Patient Name: Jerry [name] Birthdate: 10-31-1956 Sex: male Admission Date (Current Location): 12/25/2017  [place] and [place] Number:  Engineering geologist and Address:  [facility], [address] [place], [place] [address]      Provider Number: [id number]  Attending Physician Name and Address:  Fritzi Mandes, MD  Relative Name and Phone Number:       Current Level of Care: Hospital Recommended Level of Care: [place] Prior Approval Number:    Date Approved/Denied:   PASRR Number:    Discharge Plan: SNF    Current Diagnoses: Patient Active Problem List   Diagnosis Date Noted  . Schizophrenia ([place])   . Sepsis ([place])   . Palliative care by specialist   . Advance care planning   . Goals of care, counseling/discussion   . CHF (congestive heart failure) ([place])   . Altered mental status   . Septic shock ([place]) 08/02/2017  . Thrombocytopenia ([place]) 06/21/2017  . Leukopenia 06/21/2017  . Pressure injury of skin 06/21/2017    Orientation RESPIRATION BLADDER Height & Weight     Self  O2(2 liters) Continent Weight: 298 lb 1 oz (135.2 kg) Height:  6\' 2"  (188 cm)  BEHAVIORAL SYMPTOMS/MOOD NEUROLOGICAL BOWEL NUTRITION STATUS  (none) (none) Continent Dysphagia 1, nectar thick liquids  AMBULATORY STATUS COMMUNICATION OF NEEDS Skin   Total Care Verbally Normal                       Personal Care Assistance Level of Assistance  Total care           Functional Limitations Info  (no issues reported)          SPECIAL CARE FACTORS FREQUENCY                       Contractures Contractures Info: Not present    Additional Factors Info  Code Status Code Status Info: full             Current Medications (12/26/2017):  This is the current hospital active medication list Current Facility-Administered Medications  Medication Dose Route  Frequency Provider Last Rate Last Dose  . budesonide (PULMICORT) nebulizer solution 0.5 mg  0.5 mg Nebulization BID Wilhelmina Mcardle, MD      . ceFEPIme (MAXIPIME) 2 g in dextrose 5 % 50 mL IVPB  2 g Intravenous Q12H Wilhelmina Mcardle, MD   Stopped at 12/26/17 1203  . chlorhexidine (PERIDEX) 0.12 % solution 15 mL  15 mL Mouth Rinse BID Tukov, Magadalene S, NP   15 mL at 12/26/17 1134  . dextrose 5 % solution   Intravenous Continuous Wilhelmina Mcardle, MD 50 mL/hr at 12/26/17 1400    . docusate sodium (COLACE) capsule 100 mg  100 mg Oral BID PRN Vaughan Basta, MD      . hydrocortisone sodium succinate (SOLU-CORTEF) 100 MG injection 50 mg  50 mg Intravenous Q6H Wilhelmina Mcardle, MD   50 mg at 12/26/17 1133  . insulin aspart (novoLOG) injection 0-15 Units  0-15 Units Subcutaneous Q4H Wilhelmina Mcardle, MD      . ipratropium-albuterol (DUONEB) 0.5-2.5 (3) MG/3ML nebulizer solution 3 mL  3 mL Nebulization Q6H Wilhelmina Mcardle, MD   3 mL at 12/26/17 1349  . levofloxacin (LEVAQUIN) IVPB 750 mg  750 mg Intravenous Q24H Larene Beach, RPH      .  levothyroxine (SYNTHROID, LEVOTHROID) injection 125 mcg  125 mcg Intravenous Daily Wilhelmina Mcardle, MD   125 mcg at 12/26/17 1218  . MEDLINE mouth rinse  15 mL Mouth Rinse q12n4p Tukov, Magadalene S, NP   15 mL at 12/26/17 1135  . norepinephrine (LEVOPHED) 4 mg in dextrose 5 % 250 mL (0.016 mg/mL) infusion  0-40 mcg/min Intravenous Titrated Vaughan Basta, MD   Stopped at 12/26/17 1231  . vancomycin (VANCOCIN) 1,500 mg in sodium chloride 0.9 % 500 mL IVPB  1,500 mg Intravenous Q8H Larene Beach, [facility]   Stopped at 12/26/17 8850     Discharge Medications: Please see discharge summary for a list of discharge medications.  Relevant Imaging Results:  Relevant Lab Results:   Additional Information    Shela Leff, LCSW

## 2017-12-26 NOTE — Progress Notes (Signed)
Unable to provide education due to pt's medical condition at this time. No family available to educate.

## 2017-12-27 ENCOUNTER — Inpatient Hospital Stay: Payer: Medicaid Other

## 2017-12-27 DIAGNOSIS — E271 Primary adrenocortical insufficiency: Secondary | ICD-10-CM

## 2017-12-27 DIAGNOSIS — R579 Shock, unspecified: Secondary | ICD-10-CM

## 2017-12-27 DIAGNOSIS — E662 Morbid (severe) obesity with alveolar hypoventilation: Secondary | ICD-10-CM

## 2017-12-27 LAB — CBC
HEMATOCRIT: 29.4 % — AB (ref 40.0–52.0)
HEMOGLOBIN: 9.6 g/dL — AB (ref 13.0–18.0)
MCH: 29.2 pg (ref 26.0–34.0)
MCHC: 32.5 g/dL (ref 32.0–36.0)
MCV: 89.7 fL (ref 80.0–100.0)
Platelets: 40 10*3/uL — ABNORMAL LOW (ref 150–440)
RBC: 3.28 MIL/uL — AB (ref 4.40–5.90)
RDW: 17.5 % — ABNORMAL HIGH (ref 11.5–14.5)
WBC: 7.3 10*3/uL (ref 3.8–10.6)

## 2017-12-27 LAB — VANCOMYCIN, RANDOM: Vancomycin Rm: 34

## 2017-12-27 LAB — BASIC METABOLIC PANEL
ANION GAP: 7 (ref 5–15)
BUN: 29 mg/dL — ABNORMAL HIGH (ref 6–20)
CALCIUM: 9 mg/dL (ref 8.9–10.3)
CHLORIDE: 115 mmol/L — AB (ref 101–111)
CO2: 27 mmol/L (ref 22–32)
Creatinine, Ser: 0.62 mg/dL (ref 0.61–1.24)
GFR calc non Af Amer: >60 mL/min (ref >60)
GLUCOSE: 80 mg/dL (ref 65–99)
POTASSIUM: 3.8 mmol/L (ref 3.5–5.1)
Sodium: 149 mmol/L — ABNORMAL HIGH (ref 135–145)

## 2017-12-27 LAB — URINE CULTURE

## 2017-12-27 LAB — GLUCOSE, CAPILLARY
GLUCOSE-CAPILLARY: 65 mg/dL (ref 65–99)
GLUCOSE-CAPILLARY: 67 mg/dL (ref 65–99)
GLUCOSE-CAPILLARY: 69 mg/dL (ref 65–99)
GLUCOSE-CAPILLARY: 71 mg/dL (ref 65–99)
GLUCOSE-CAPILLARY: 76 mg/dL (ref 65–99)
GLUCOSE-CAPILLARY: 76 mg/dL (ref 65–99)
GLUCOSE-CAPILLARY: 85 mg/dL (ref 65–99)
Glucose-Capillary: 76 mg/dL (ref 65–99)
Glucose-Capillary: 81 mg/dL (ref 65–99)

## 2017-12-27 LAB — VANCOMYCIN, TROUGH: VANCOMYCIN TR: 39 ug/mL — AB (ref 15–20)

## 2017-12-27 MED ORDER — HYDROCORTISONE NA SUCCINATE PF 100 MG IJ SOLR
25.0000 mg | Freq: Two times a day (BID) | INTRAMUSCULAR | Status: DC
  Administered 2017-12-27 – 2017-12-28 (×2): 25 mg via INTRAVENOUS
  Filled 2017-12-27 (×2): qty 2

## 2017-12-27 MED ORDER — DEXTROSE 10 % IV SOLN
INTRAVENOUS | Status: DC
  Administered 2017-12-27: 50 mL/h via INTRAVENOUS
  Administered 2017-12-28: 18:00:00 via INTRAVENOUS
  Administered 2017-12-28: 75 mL/h via INTRAVENOUS
  Administered 2017-12-29: 08:00:00 via INTRAVENOUS

## 2017-12-27 MED ORDER — DEXTROSE 50 % IV SOLN
INTRAVENOUS | Status: AC
  Administered 2017-12-27: 50 mL
  Filled 2017-12-27: qty 50

## 2017-12-27 MED ORDER — LEVOTHYROXINE SODIUM 50 MCG PO TABS
250.0000 ug | ORAL_TABLET | Freq: Every day | ORAL | Status: DC
  Administered 2017-12-28 – 2017-12-30 (×3): 250 ug via ORAL
  Filled 2017-12-27 (×4): qty 1

## 2017-12-27 MED ORDER — CHLORHEXIDINE GLUCONATE CLOTH 2 % EX PADS
6.0000 | MEDICATED_PAD | Freq: Every day | CUTANEOUS | Status: DC
  Administered 2017-12-28 – 2017-12-30 (×3): 6 via TOPICAL

## 2017-12-27 MED ORDER — INSULIN ASPART 100 UNIT/ML ~~LOC~~ SOLN: 0.0000 [IU] | SUBCUTANEOUS | Status: DC

## 2017-12-27 MED ORDER — DEXTROSE 50 % IV SOLN: 25.0000 g | Freq: Once | INTRAVENOUS | Status: AC

## 2017-12-27 MED ORDER — MUPIROCIN 2 % EX OINT
1.0000 "application " | TOPICAL_OINTMENT | Freq: Two times a day (BID) | CUTANEOUS | Status: DC
  Administered 2017-12-27 – 2017-12-30 (×6): 1 via NASAL
  Filled 2017-12-27: qty 22

## 2017-12-27 NOTE — Progress Notes (Signed)
ANTIBIOTIC CONSULT NOTE - INITIAL  Pharmacy Consult for Vancomycin, Levofloxacin, and Aztreona Indication: sepsis  Allergies  Allergen Reactions  . Methadone   . Penicillins     Tolerates cephalosporins   . Propoxyphene   . Sulfa Antibiotics   . Valproic Acid And Related     Thrombocytopenia    Patient Measurements: Height: 6\' 2"  (188 cm) Weight: 298 lb 1 oz (135.2 kg) IBW/kg (Calculated) : 82.2 Adjusted Body Weight: 104  Vital Signs: Temp: 97.5 F (36.4 C) (01/31 0100) Temp Source: Core (01/30 1600) BP: 128/86 (01/31 0100) Pulse Rate: 90 (01/31 0100) Intake/Output from previous day: 01/30 0701 - 01/31 0700 In: 831.3 [I.V.:831.3] Out: 1145 [Urine:1145] Intake/Output from this shift: Total I/O In: 250 [I.V.:250] Out: 295 [Urine:295]  Labs: Recent Labs    12/25/17 1538 12/26/17 0602  WBC 4.7 7.7  HGB 11.5* 10.6*  PLT 53* 57*  CREATININE 0.53* 0.86   Estimated Creatinine Clearance: 130.3 mL/min (by C-G formula based on SCr of 0.86 mg/dL). Recent Labs    12/26/17 2348  VANCOTROUGH 39*     Microbiology: Recent Results (from the past 720 hour(s))  Culture, blood (Routine x 2)     Status: None (Preliminary result)   Collection Time: 12/25/17  3:38 PM  Result Value Ref Range Status   Specimen Description BLOOD Blood Culture adequate volume  Final   Special Requests LEFT ANTECUBITAL  Final   Culture   Final    NO GROWTH < 24 HOURS Performed at [facility], [address], [place], [place] [address]    Report Status PENDING  Incomplete  Culture, blood (Routine x 2)     Status: None (Preliminary result)   Collection Time: 12/25/17  3:38 PM  Result Value Ref Range Status   Specimen Description BLOOD RIGHT ANTECUBITAL  Final   Special Requests   Final    BOTTLES DRAWN AEROBIC AND ANAEROBIC Blood Culture adequate volume   Culture   Final    NO GROWTH < 24 HOURS Performed at [facility], [address] [place], [place] [address]    Report Status PENDING  Incomplete  MRSA PCR Screening     Status: Abnormal   Collection Time: 12/25/17  9:16 PM  Result Value Ref Range Status   MRSA by PCR POSITIVE (A) NEGATIVE Final    Comment:        The GeneXpert MRSA Assay (FDA approved for NASAL specimens only), is one component of a comprehensive MRSA colonization surveillance program. It is not intended to diagnose MRSA infection nor to guide or monitor treatment for MRSA infections. CRITICAL RESULT CALLED TO, READ BACK BY AND VERIFIED WITH: BARBARA THAO @ 2250 ON 12/25/2017 BY CAF Performed at [facility], [address], [place], [place] [address]     Medical History: Past Medical History:  Diagnosis Date  . BPH (benign prostatic hyperplasia)   . CHF (congestive heart failure) ([place])   . Chronic pain   . COPD (chronic obstructive pulmonary disease) ([place])   . Hypertension   . Morbid obesity ([place])   . Pressure ulcer   . Rheumatoid arthritis ([place])   . Schizophrenia ([place])   . Thyroid disease     Medications:  Medications Prior to Admission  Medication Sig Dispense Refill Last Dose  . amLODipine (NORVASC) 10 MG tablet Take 10 mg by mouth daily.   12/25/2017 at Unknown time  . benztropine (COGENTIN) 0.5 MG tablet Take 0.5 mg by mouth 2 (two) times daily.  12/25/2017 at Unknown time  . carvedilol (COREG) 25 MG tablet Take 25 mg by mouth 2 (two) times daily with a meal.   12/25/2017 at Unknown time  . clonazePAM (KLONOPIN) 1 MG tablet Take 1 tablet (1 mg total) by mouth at bedtime. 5 tablet 0 12/24/2017 at Unknown time  . cloNIDine (CATAPRES - DOSED IN MG/24 HR) 0.3 mg/24hr patch Place 0.3 mg onto the skin once a week. Tuesday   12/25/2017 at Unknown time  . docusate sodium (COLACE) 100 MG capsule Take 100 mg by mouth 2 (two) times daily.   12/25/2017 at Unknown time  . fluPHENAZine (PROLIXIN) 2.5 MG tablet Take 5 tablets (12.5 mg total) by mouth daily. Take 12.5 mg by mouth in the morning and 15 mg by mouth at  bedtime. (Patient taking differently: Take 15 mg by mouth 2 (two) times daily. )   12/25/2017 at Unknown time  . hydroxychloroquine (PLAQUENIL) 200 MG tablet Take 200 mg by mouth 2 (two) times daily.   12/25/2017 at Unknown time  . levothyroxine (SYNTHROID, LEVOTHROID) 200 MCG tablet Take 1 tablet (200 mcg total) by mouth daily before breakfast. (Patient taking differently: Take 250 mcg by mouth daily before breakfast. ) 30 tablet 0 12/25/2017 at Unknown time  . methocarbamol (ROBAXIN) 750 MG tablet Take 750 mg by mouth at bedtime.   12/24/2017 at Unknown time  . Multiple Vitamins-Minerals (MULTIVITAMIN WITH MINERALS) tablet Take 1 tablet by mouth daily.   12/25/2017 at Unknown time  . Polyethylene Glycol 3350 (MIRALAX PO) Take 17 g by mouth daily.   12/25/2017 at Unknown time  . potassium chloride (MICRO-K) 10 MEQ CR capsule Take 10 mEq by mouth daily.   12/25/2017 at Unknown time  . tamsulosin (FLOMAX) 0.4 MG CAPS capsule Take 0.4 mg by mouth daily.   12/25/2017 at Unknown time  . torsemide (DEMADEX) 10 MG tablet Take 10 mg by mouth daily.   12/25/2017 at Unknown time  . traZODone (DESYREL) 150 MG tablet Take 150 mg by mouth at bedtime.   12/24/2017 at Unknown time  . vitamin C (ASCORBIC ACID) 250 MG tablet Take 500 mg by mouth 2 (two) times daily.   12/25/2017 at Unknown time  . albuterol (PROVENTIL HFA;VENTOLIN HFA) 108 (90 Base) MCG/ACT inhaler Inhale 1 puff into the lungs every 3 (three) hours as needed for wheezing or shortness of breath.   PRN at PRN  . predniSONE (DELTASONE) 10 MG tablet Take 1 tablet (10 mg total) by mouth daily. 50 MG daily for 2 days 40 mg daily for 2 days 30 mg daily for 2 days 10 mg daily for 2 days and then stop (Patient not taking: Reported on 12/25/2017) 30 tablet 0 Not Taking at Unknown time  . protein supplement shake (PREMIER PROTEIN) LIQD Take 325 mLs (11 oz total) by mouth 3 (three) times daily between meals. 30 Can 0 PRN at PRN   Scheduled:  . budesonide (PULMICORT)  nebulizer solution  0.5 mg Nebulization BID  . chlorhexidine  15 mL Mouth Rinse BID  . hydrocortisone sod succinate (SOLU-CORTEF) inj  50 mg Intravenous Q12H  . insulin aspart  0-15 Units Subcutaneous Q4H  . ipratropium-albuterol  3 mL Nebulization Q6H  . levothyroxine  125 mcg Intravenous Daily  . mouth rinse  15 mL Mouth Rinse q12n4p   Infusions:  . ceFEPime (MAXIPIME) IV Stopped (12/26/17 2144)  . dextrose 50 mL/hr at 12/27/17 0000  . levofloxacin (LEVAQUIN) IV Stopped (12/26/17 2003)  . norepinephrine (LEVOPHED) Adult  infusion Stopped (12/26/17 1231)   Assessment: Pharmacy to dose and monitor Vancomycin, Azactam, and levofloxacin in this 62 year old obese patient.   Goal of Therapy:  Vancomycin trough level 15-20 mcg/ml  Plan:  Vancomycin: Will give Vancomycin 2 g IV x 1 then Vancomycin 1500 mg IV q8hours. Trough level prior to the 4th dose of regimen.  Azactam: will start azactam 1 g IV q8 hours  Levofloxacin: Will start levofloxacin 750 mg IV q24 hours.   01/30 @ 2348 VT 39 supratherapeutic. Level appears to have been drawn appropriately, will hold off vanc for now and will draw a random level w/ am labs and BMP to assess renal function. Once level < 20 mcg/mL will reasses.  Tobie Lords, PharmD, BCPS Clinical Pharmacist 12/27/2017

## 2017-12-27 NOTE — Progress Notes (Signed)
[facility] [facility] [place] NAME: Jerry [name]    MR#:  [id number]  DATE OF BIRTH:  13-Apr-1956  SUBJECTIVE:   Patient is off vasopressors now, remains somewhat lethargic/encephalopathic.  Plan to transfer out of the ICU later today.  REVIEW OF SYSTEMS:   Review of Systems  Unable to perform ROS: Mental acuity    DRUG ALLERGIES:   Allergies  Allergen Reactions  . Methadone   . Penicillins     Tolerates cephalosporins   . Propoxyphene   . Sulfa Antibiotics   . Valproic Acid And Related     Thrombocytopenia    VITALS:  Blood pressure (!) 146/110, pulse 86, temperature (!) 96.1 F (35.6 C), resp. rate 13, height _0  (1.88 m), weight 135.2 kg (298 lb 1 oz), SpO2 100 %.  PHYSICAL EXAMINATION:   Physical Exam  GENERAL:  62 y.o.-year-old morbidly obese patient lying in the bed in no acute distress.   EYES: Pupils equal, round, reactive to light and accommodation. No scleral icterus. Extraocular muscles intact.  HEENT: Head atraumatic, normocephalic. Oropharynx and nasopharynx clear.  NECK:  Supple, no jugular venous distention. No thyroid enlargement, no tenderness.  LUNGS: Poor Resp. effort, no wheezing, rales, rhonchi. No use of accessory muscles of respiration.  CARDIOVASCULAR: S1, S2 normal. No murmurs, rubs, or gallops.  ABDOMEN: Soft, nontender, nondistended. Bowel sounds present. No organomegaly or mass.  EXTREMITIES: No cyanosis, clubbing or edema b/l.    NEUROLOGIC: CN II-XII intact, no focal motor or sensory deficits appreciate b/l.  Globally weak.  PSYCHIATRIC:  AAO X 1.  SKIN: No obvious rash, lesion, or ulcer.   LABORATORY PANEL:  CBC Recent Labs  Lab 12/27/17 0357  WBC 7.3  HGB 9.6*  HCT 29.4*  PLT 40*    Chemistries  Recent Labs  Lab 12/25/17 1538 12/26/17 0602 12/27/17 0357  NA 148* 146* 149*  K 4.4 4.3 3.8  CL 110 112* 115*  CO2 33* 27 27  GLUCOSE 94 84 80  BUN 37* 35* 29*  CREATININE 0.53*  0.86 0.62  CALCIUM 9.6 8.9 9.0  MG  --  2.0  --   AST 66*  --   --   ALT 54  --   --   ALKPHOS 126  --   --   BILITOT 0.5  --   --    Cardiac Enzymes Recent Labs  Lab 12/25/17 1538  TROPONINI <0.03   RADIOLOGY:  Dg Abd 1 View  Result Date: 12/27/2017 CLINICAL DATA:  Ingested foreign body. EXAM: ABDOMEN - 1 VIEW COMPARISON:  06/26/2017 FINDINGS: Airspace disease identified scratches that interstitial and airspace disease at the left base similar to chest x-ray earlier today. Diffuse gaseous bowel distention noted without an overt obstructive pattern. No evidence for radiopaque foreign body over the abdomen although study limited by patient body habitus and prominent stool. IMPRESSION: No definite foreign body identified although large body habitus and large volume stool/gas hinders assessment for nonmetallic foreign body. Electronically Signed   By: Misty Stanley M.D.   On: 12/27/2017 13:57   Dg Chest Port 1 View  Result Date: 12/27/2017 CLINICAL DATA:  Respiratory failure EXAM: PORTABLE CHEST 1 VIEW COMPARISON:  Two days ago FINDINGS: Low volume chest with patchy interstitial coarsening that is stable. Chronic cardiomegaly and vascular pedicle widening. Possible small pleural effusion on the left. No pneumothorax. IMPRESSION: 1. Stable low volume chest with interstitial opacities bilaterally. Atelectasis, infection, or fibrosis could have  this appearance. 2. Cardiomegaly. Electronically Signed   By: Monte Fantasia M.D.   On: 12/27/2017 08:12   ASSESSMENT AND PLAN:   Dredyn Gubbels  is a 62 y.o. male with a known history of BPH, CHF, COPD, hypertension, rheumatoid arthritis, schizophrenia, thyroid disease- is a nursing home resident and sent with sepsis. Patient is drowsy and not able to give any history.patient was noted to be hypoxic, hypotensive, hypothermic in ER.  1.  Sepsis -patient met sepsis criteria on admission given his hypothermia, hypotension. - Sepsis has also been ruled out,  there is no acute infectious source noted.  Patient's hypotension hypothermia is from presumed adrenal insufficiency. -Patient has been weaned off IV vasopressors, cont. Empiric Levaquin.  Cultures remain negative. - Placed on stress dose steroids and hemodynamics have improved.  Remains afebrile. -Discussed plan of care with intensivist.  2.  Acute on chronic hypoxic respiratory failure secondary to COPD - weaned off Bipap now and stable.  - cont. Duonebs, Pulmicort nebs.   3.  Hypotension- secondary to presumed adrenal insufficiency. - Blood pressure has improved, continue IV steroids.  Off vasopressors. - will need to be on some Oral steroids upon discharge.   4.  Chronic diastolic congestive heart failure - clinically pt. Is not in CHF.   5. Hypothyroidism - cont. Synthroid.    6.  History of schizophrenia --At present patient is not verbal.  Pt. Is on Prolixin, Cogentin and will hold meds for now given encephalopathy. ?? Consider psych consult to see if meds need to be adjusted.     Case discussed with Care Management/Social Worker. Management plans discussed with the patient, family and they are in agreement.  CODE STATUS: Full  DVT Prophylaxis: Ted & SCD's.   TOTAL TIME TAKING CARE OF THIS PATIENT: 30 minutes.   POSSIBLE D/C IN few DAYS, DEPENDING ON CLINICAL CONDITION.  Note: This dictation was prepared with Dragon dictation along with smaller phrase technology. Any transcriptional errors that result from this process are unintentional.  Henreitta Leber M.D on 12/27/2017 at 4:58 PM  Between 7am to 6pm - Pager - [phone]  After 6pm go to [email or web address] - Proofreader  [facility]  Office  [phone]  CC: Primary care physician; [facility], Doctors MakingPatient ID: Jerry [name], male   DOB: 11-06-1956, 62 y.o.   MRN: [id number]

## 2017-12-27 NOTE — Progress Notes (Signed)
Chart reviewed and patient assessment completed. He is awake but with minimal speech and disoriented. No family at bedside. Palliative NP will f/u tomorrow, 2/1 and reach out to family for initial [place] conversation.   NO CHARGE  Ihor Dow, FNP-C Palliative Medicine Team  Phone: [phone] Fax: [phone]

## 2017-12-27 NOTE — Progress Notes (Signed)
ANTIBIOTIC CONSULT NOTE - INITIAL  Pharmacy Consult for Vancomycin, Levofloxacin, and Aztreona Indication: sepsis  Allergies  Allergen Reactions  . Methadone   . Penicillins     Tolerates cephalosporins   . Propoxyphene   . Sulfa Antibiotics   . Valproic Acid And Related     Thrombocytopenia    Patient Measurements: Height: 6\' 2"  (188 cm) Weight: 298 lb 1 oz (135.2 kg) IBW/kg (Calculated) : 82.2 Adjusted Body Weight: 104  Vital Signs: Temp: 98.6 F (37 C) (01/31 0600) BP: 124/84 (01/31 0600) Pulse Rate: 93 (01/31 0600) Intake/Output from previous day: 01/30 0701 - 01/31 0700 In: 1131.3 [I.V.:1131.3] Out: 1395 [Urine:1395] Intake/Output from this shift: No intake/output data recorded.  Labs: Recent Labs    12/25/17 1538 12/26/17 0602 12/27/17 0357  WBC 4.7 7.7 7.3  HGB 11.5* 10.6* 9.6*  PLT 53* 57* 40*  CREATININE 0.53* 0.86 0.62   Estimated Creatinine Clearance: 140 mL/min (by C-G formula based on SCr of 0.62 mg/dL). Recent Labs    12/26/17 2348 12/27/17 0357  VANCOTROUGH 17*  --   VANCORANDOM  --  34     Microbiology: Recent Results (from the past 720 hour(s))  Culture, blood (Routine x 2)     Status: None (Preliminary result)   Collection Time: 12/25/17  3:38 PM  Result Value Ref Range Status   Specimen Description BLOOD Blood Culture adequate volume  Final   Special Requests LEFT ANTECUBITAL  Final   Culture   Final    NO GROWTH < 24 HOURS Performed at [facility], [address] [place], [place] [address]    Report Status PENDING  Incomplete  Culture, blood (Routine x 2)     Status: None (Preliminary result)   Collection Time: 12/25/17  3:38 PM  Result Value Ref Range Status   Specimen Description BLOOD RIGHT ANTECUBITAL  Final   Special Requests   Final    BOTTLES DRAWN AEROBIC AND ANAEROBIC Blood Culture adequate volume   Culture   Final    NO GROWTH < 24 HOURS Performed at [facility], [address],  [place], [place] [address]    Report Status PENDING  Incomplete  MRSA PCR Screening     Status: Abnormal   Collection Time: 12/25/17  9:16 PM  Result Value Ref Range Status   MRSA by PCR POSITIVE (A) NEGATIVE Final    Comment:        The GeneXpert MRSA Assay (FDA approved for NASAL specimens only), is one component of a comprehensive MRSA colonization surveillance program. It is not intended to diagnose MRSA infection nor to guide or monitor treatment for MRSA infections. CRITICAL RESULT CALLED TO, READ BACK BY AND VERIFIED WITH: BARBARA THAO @ 2250 ON 12/25/2017 BY CAF Performed at [facility], [address] [place], [place] [address]     Medical History: Past Medical History:  Diagnosis Date  . BPH (benign prostatic hyperplasia)   . CHF (congestive heart failure) ([place])   . Chronic pain   . COPD (chronic obstructive pulmonary disease) ([place])   . Hypertension   . Morbid obesity ([place])   . Pressure ulcer   . Rheumatoid arthritis ([place])   . Schizophrenia ([place])   . Thyroid disease     Medications:  Medications Prior to Admission  Medication Sig Dispense Refill Last Dose  . amLODipine (NORVASC) 10 MG tablet Take 10 mg by mouth daily.   12/25/2017 at Unknown time  . benztropine (COGENTIN) 0.5 MG tablet  Take 0.5 mg by mouth 2 (two) times daily.   12/25/2017 at Unknown time  . carvedilol (COREG) 25 MG tablet Take 25 mg by mouth 2 (two) times daily with a meal.   12/25/2017 at Unknown time  . clonazePAM (KLONOPIN) 1 MG tablet Take 1 tablet (1 mg total) by mouth at bedtime. 5 tablet 0 12/24/2017 at Unknown time  . cloNIDine (CATAPRES - DOSED IN MG/24 HR) 0.3 mg/24hr patch Place 0.3 mg onto the skin once a week. Tuesday   12/25/2017 at Unknown time  . docusate sodium (COLACE) 100 MG capsule Take 100 mg by mouth 2 (two) times daily.   12/25/2017 at Unknown time  . fluPHENAZine (PROLIXIN) 2.5 MG tablet Take 5 tablets (12.5 mg total) by mouth daily. Take 12.5 mg by mouth in the morning  and 15 mg by mouth at bedtime. (Patient taking differently: Take 15 mg by mouth 2 (two) times daily. )   12/25/2017 at Unknown time  . hydroxychloroquine (PLAQUENIL) 200 MG tablet Take 200 mg by mouth 2 (two) times daily.   12/25/2017 at Unknown time  . levothyroxine (SYNTHROID, LEVOTHROID) 200 MCG tablet Take 1 tablet (200 mcg total) by mouth daily before breakfast. (Patient taking differently: Take 250 mcg by mouth daily before breakfast. ) 30 tablet 0 12/25/2017 at Unknown time  . methocarbamol (ROBAXIN) 750 MG tablet Take 750 mg by mouth at bedtime.   12/24/2017 at Unknown time  . Multiple Vitamins-Minerals (MULTIVITAMIN WITH MINERALS) tablet Take 1 tablet by mouth daily.   12/25/2017 at Unknown time  . Polyethylene Glycol 3350 (MIRALAX PO) Take 17 g by mouth daily.   12/25/2017 at Unknown time  . potassium chloride (MICRO-K) 10 MEQ CR capsule Take 10 mEq by mouth daily.   12/25/2017 at Unknown time  . tamsulosin (FLOMAX) 0.4 MG CAPS capsule Take 0.4 mg by mouth daily.   12/25/2017 at Unknown time  . torsemide (DEMADEX) 10 MG tablet Take 10 mg by mouth daily.   12/25/2017 at Unknown time  . traZODone (DESYREL) 150 MG tablet Take 150 mg by mouth at bedtime.   12/24/2017 at Unknown time  . vitamin C (ASCORBIC ACID) 250 MG tablet Take 500 mg by mouth 2 (two) times daily.   12/25/2017 at Unknown time  . albuterol (PROVENTIL HFA;VENTOLIN HFA) 108 (90 Base) MCG/ACT inhaler Inhale 1 puff into the lungs every 3 (three) hours as needed for wheezing or shortness of breath.   PRN at PRN  . predniSONE (DELTASONE) 10 MG tablet Take 1 tablet (10 mg total) by mouth daily. 50 MG daily for 2 days 40 mg daily for 2 days 30 mg daily for 2 days 10 mg daily for 2 days and then stop (Patient not taking: Reported on 12/25/2017) 30 tablet 0 Not Taking at Unknown time  . protein supplement shake (PREMIER PROTEIN) LIQD Take 325 mLs (11 oz total) by mouth 3 (three) times daily between meals. 30 Can 0 PRN at PRN   Scheduled:  .  budesonide (PULMICORT) nebulizer solution  0.5 mg Nebulization BID  . chlorhexidine  15 mL Mouth Rinse BID  . hydrocortisone sod succinate (SOLU-CORTEF) inj  50 mg Intravenous Q12H  . insulin aspart  0-15 Units Subcutaneous Q4H  . ipratropium-albuterol  3 mL Nebulization Q6H  . levothyroxine  125 mcg Intravenous Daily  . mouth rinse  15 mL Mouth Rinse q12n4p   Infusions:  . ceFEPime (MAXIPIME) IV Stopped (12/26/17 2144)  . dextrose 50 mL/hr at 12/27/17 0600  .  levofloxacin (LEVAQUIN) IV Stopped (12/26/17 2003)  . norepinephrine (LEVOPHED) Adult infusion Stopped (12/26/17 1231)   Assessment: Pharmacy to dose and monitor Vancomycin, Azactam, and levofloxacin in this 62 year old obese patient.   Goal of Therapy:  Vancomycin trough level 15-20 mcg/ml  Plan:  Vancomycin: Will give Vancomycin 2 g IV x 1 then Vancomycin 1500 mg IV q8hours. Trough level prior to the 4th dose of regimen.  Azactam: will start azactam 1 g IV q8 hours  Levofloxacin: Will start levofloxacin 750 mg IV q24 hours.   01/30 @ 2348 VT 39 supratherapeutic. Level appears to have been drawn appropriately, will hold off vanc for now and will draw a random level w/ am labs and BMP to assess renal function. Once level < 20 mcg/mL will reasses.  01/31 @ 0400 VR 34 supratherapeutic. Will continue to hold off vanc and check another random level 0201 w/ am labs. New Ke based on two levels: 0.0343 T1/2 20 ~ 24 hrs Patient expected to be down to 15 mcg/mL after 24 hours. Renal function improving, patient may be accumulating.  Tobie Lords, PharmD, BCPS Clinical Pharmacist 12/27/2017

## 2017-12-27 NOTE — Progress Notes (Addendum)
PULMONARY / CRITICAL CARE MEDICINE   Name: Jerry [name] MRN: [id number] DOB: 1956/01/19    ADMISSION DATE:  12/25/2017   CONSULTATION DATE: 12/25/2017  REFERRING MD: Dr. Anselm Jungling  Reason: septic shock secondary to pneumonia  PT PROFILE:   6 M SNF resident with hx of schizophrenia, hypothyroidism sent to the ED from SNF with AMS, hypothermia, hypotension, bradycardia.  Diagnosed with sepsis, unclear source. Notably, he has had 2 identical admissions in past few months and was previously thought to have possible adrenal insufficiency. Previously, he has seemingly responded to stress dose steroids (in addition to abx).   MAJOR EVENTS/TEST RESULTS: 01/29 CT head: No acute findings  INDWELLING DEVICES:   MICRO DATA: MRSA PCR 1/29 >> POS Urine 1/29 >>  Resp 1/29 >>  Blood 1/29 >>   ANTIMICROBIALS:  Vanc 01/29 >> 01/31 Aztreonam 01/29 >> 01/30 Cefepime 01/30 >> 01/31 Levofloxacin 01/29 >>    SUBJECTIVE:  RASS 0.  Follows commands.  Denies pain and shortness of breath.  VITAL SIGNS: BP 133/89   Pulse 86   Temp (!) 96.1 F (35.6 C)   Resp 16   Ht 6\' 2"  (1.88 m)   Wt 135.2 kg (298 lb 1 oz)   SpO2 100%   BMI 38.27 kg/m   HEMODYNAMICS:    VENTILATOR SETTINGS: FiO2 (%):  [30 %] 30 %  INTAKE / OUTPUT: I/O last 3 completed shifts: In: 2464.3 [I.V.:1464.3; IV Piggyback:1000] Out: 2095 [Urine:2095]  PHYSICAL EXAMINATION: General: Morbidly obese, NAD Neuro: Cranial nerves intact, moves all extremities HEENT: NCAT, sclerae white, oropharynx normal Cardiovascular: RRR, no murmur Lungs: Clear anteriorly, no wheezes or other adventitious sounds i Abdomen: Obese, soft, NABS Ext: 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, chronic lower extremity stasis changes with mild symmetric edema  LABS:  BMET Recent Labs  Lab 12/25/17 1538 12/26/17 0602 12/27/17 0357  NA 148* 146* 149*  K 4.4 4.3 3.8  CL 110 112* 115*  CO2 33* 27 27  BUN 37* 35*  29*  CREATININE 0.53* 0.86 0.62  GLUCOSE 94 84 80    Electrolytes Recent Labs  Lab 12/25/17 1538 12/26/17 0602 12/27/17 0357  CALCIUM 9.6 8.9 9.0  MG  --  2.0  --   PHOS  --  3.2  --     CBC Recent Labs  Lab 12/25/17 1538 12/26/17 0602 12/27/17 0357  WBC 4.7 7.7 7.3  HGB 11.5* 10.6* 9.6*  HCT 35.9* 32.4* 29.4*  PLT 53* 57* 40*    Coag's Recent Labs  Lab 12/25/17 1538  INR 1.14    Sepsis Markers Recent Labs  Lab 12/25/17 1538 12/25/17 1802  LATICACIDVEN 0.9 0.6  PROCALCITON <0.10  --     ABG No results for input(s): PHART, PCO2ART, PO2ART in the last 168 hours.  Liver Enzymes Recent Labs  Lab 12/25/17 1538  AST 66*  ALT 54  ALKPHOS 126  BILITOT 0.5  ALBUMIN 3.3*    Cardiac Enzymes Recent Labs  Lab 12/25/17 1538  TROPONINI <0.03    Glucose Recent Labs  Lab 12/26/17 1936 12/27/17 0007 12/27/17 0401 12/27/17 0722 12/27/17 1142 12/27/17 1149  GLUCAP 125* 76 76 76 69 71    CXR: Cardiomegaly, low lung volumes, crowding of lung markings.  No definite pneumonia   DISCUSSION: He has had repeated admissions with very similar presentations.  On none of these admissions have we definitively proven sepsis.  Notably, his PCT has been normal on every one of these admissions.  Previously, serum cortisol levels were inappropriately low suggesting possible adrenal insufficiency.  He has never had a formal cosyntropin stimulation test performed.  It is not clear if he was previously on chronic steroid therapy (for RA).  Nonetheless, he seems to improve significantly with stress dose steroids although it is difficult to separate his response out from a possible response to antibiotic therapy.  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 "burned out".  It is possible that his psychiatric medications are contributing to his repeated admissions that include altered mental status -acute encephalopathy, resolved -Acute on chronic thrombocytopenia -History of hypertension  -Transfer to MedSurg floor -Continue supplemental oxygen carefully titrated to keep SPO2 88-94% -Continue nocturnal BiPAP for OHS and presumed OSA -Discontinue vancomycin, cefepime -Continue levofloxacin for now -would consider discontinuation in the next couple of days if no infection definitively found -Recommend reevaluation of his psychiatric medications to assess whether he continues to require Prolixin, benztropine, trazodone, clonazepam -Recommend reevaluating antihypertensive regimen prior to discharge as these medications are almost certainly contributing to his repeated hospitalizations for hypotension -Continue levothyroxine and change to p.o. when able -At this point, as it seems unlikely that we will be able to perform a formal cosyntropin stimulation test, I recommend empiric therapy with hydrocortisone at 20 mg in the morning, 10 mg at night to be continued indefinitely  -Goals of care should be addressed.  He is an extremely poor candidate for intubation, ACLS, dialysis   I have discussed in detail with Dr. Verdell Carmine  After transfer, PCCM will sign off. Please call if we can be of further assistance   Merton Border, MD PCCM service Mobile [phone] Pager [phone] 12/27/2017 2:41 PM

## 2017-12-27 NOTE — Clinical Social Work Note (Signed)
Clinical Social Work Assessment  Patient Details  Name: Jerry [name] MRN: [id number] Date of Birth: 1956/09/12  Date of referral:  12/26/17               Reason for consult:  Discharge Planning                Permission sought to share information with:  Case Manager, Facility Sport and exercise psychologist, Family Supports Permission granted to share information::  Yes, Verbal Permission Granted  Name::        Agency::  [facility] (LTC)  Relationship::  [name] neice [phone]  Contact Information:     Housing/Transportation Living arrangements for the past 2 months:  [place] of Information:  Medical Team, Facility Patient Interpreter Needed:  None Criminal Activity/Legal Involvement Pertinent to Current Situation/Hospitalization:  No - Comment as needed Significant Relationships:  Other Family Members, Community Support Lives with:  Facility Resident Do you feel safe going back to the place where you live?  Yes Need for family participation in patient care:  Yes (Comment)  Care giving concerns:  Patient admitted from long term care facility [facility]. Patent has been a long term patient 4+ years.  He is admitted  with a known history of BPH, CHF, COPD, hypertension, rheumatoid arthritis, schizophrenia, thyroid disease- is a nursing home resident and sent with sepsis. Patient is drowsy and not able to give any history. Is noted to be hypoxic, hypotensive, hypothermic in ER. Started on BiPAP in ER, he suggested to admit.  Patient plans to return to LTC facility at discharge. No barriers from facility at this time. Niece has been called and message left regarding any concerns. Facility reports she does not always answer or call back, but will continue to reach.   Social Worker assessment / plan:  Assessment completed. Plan: return to LTC facility at DC. FL2 updated.  Employment status:  Retired Forensic scientist:  Medicaid In [place]  Recommendations:  Not assessed at this time Information / Referral to community resources:     Patient/Family's Response to care:  Still assessing, call placed to facility which voices no concerns from family or facility. Message left for neice  Patient/Family's Understanding of and Emotional Response to Diagnosis, Current Treatment, and Prognosis:  Unable to assess at this time. Patient not able to participate.  Emotional Assessment Appearance:  Appears stated age Attitude/Demeanor/Rapport:    Affect (typically observed):  Accepting, Adaptable Orientation:  Oriented to Self Alcohol / Substance use:  Not Applicable Psych involvement (Current and /or in the community):  No (Comment)  Discharge Needs  Concerns to be addressed:  No discharge needs identified Readmission within the last 30 days:  No Current discharge risk:  None Barriers to Discharge:  No Barriers Identified, Continued Medical Work up   [place]illy [place], LCSW 12/27/2017, 12:08 PM

## 2017-12-27 NOTE — Evaluation (Addendum)
Clinical/Bedside Swallow Evaluation Patient Details  Name: Jerry [name] MRN: [id number] Date of Birth: Dec 27, 1955  Today's Date: 12/27/2017 Time: SLP Start Time (ACUTE ONLY): 1150 SLP Stop Time (ACUTE ONLY): 1250 SLP Time Calculation (min) (ACUTE ONLY): 60 min  Past Medical History:  Past Medical History:  Diagnosis Date  . BPH (benign prostatic hyperplasia)   . CHF (congestive heart failure) ([place])   . Chronic pain   . COPD (chronic obstructive pulmonary disease) ([place])   . Hypertension   . Morbid obesity ([place])   . Pressure ulcer   . Rheumatoid arthritis ([place])   . Schizophrenia ([place])   . Thyroid disease    Past Surgical History: History reviewed. No pertinent surgical history. HPI:  Pt is a 43 M SNF resident with hx of schizophrenia, hypothyroidism sent to the ED from SNF with AMS, hypothermia, hypotension, bradycardia.  Diagnosed with sepsis, unclear source. Notably, he has had 2 identical admissions in past few months and was previously thought to have possible adrenal insufficiency. Previously, he has seemingly responded to stress dose steroids (in addition to abx). Pt has a known history of BPH, CHF, COPD, hypertension, rheumatoid arthritis, schizophrenia, thyroid disease- is a nursing home resident.    Assessment / Plan / Recommendation Clinical Impression  Pt appears to present w/ oropharyngeal phase dysphagia w/ increased risk for aspiration - suspect pt's presentation is heavily impacted by his declined Cognitive status; impulsive behaviors. Pt exhibited impulsive drinking behavior and was easily distracted by the environment. This response behavior can increase issues during the oral phase including increasing oral holding, pocketing, and overall decreased awareness of the bolus material in the mouth. The pharyngeal phase can also be impacted by Cognitive decline which can lead to aspiration and airway compromise. Pt appeared to consumed trials of Nectar liquids and purees when fed  by SLP to ensure small bites/sips slowly, and oral clearing. No immediate, overt s/s of aspiration noted; no decline in respiratory status or O2 sats(97-98%). Pt required verbal and tactile cues to attend to po tasks intermittently. Pt required feeding support d/t deficits in his hands(RA?). Recommend initiation of a dysphagia level 1 diet w/ Nectar liquids; aspiration precautions; Pills in Puree; feeding support. ST services will f/u w/ trials to upgrade diet as appropriate while admitted. NSG updated.  SLP Visit Diagnosis: Dysphagia, oropharyngeal phase (R13.12)    Aspiration Risk  Mild aspiration risk;Moderate aspiration risk    Diet Recommendation  Dysphagia level 1(puree) w/ NECTAR consistency liquids; aspiration precautions; feeding support at meals.  Medication Administration: Crushed with puree(as able to )    Other  Recommendations Recommended Consults: (Dietician f/u) Oral Care Recommendations: Oral care BID;Staff/trained caregiver to provide oral care Other Recommendations: Order thickener from pharmacy;Prohibited food (jello, ice cream, thin soups);Remove water pitcher;Have oral suction available   Follow up Recommendations Skilled Nursing facility(TBD)      Frequency and Duration min 3x week  2 weeks       Prognosis Prognosis for Safe Diet Advancement: Fair(-Good) Barriers to Reach Goals: Cognitive deficits;Severity of deficits;Behavior Barriers/Prognosis Comment: baseline deficits      Swallow Study   General Date of Onset: 12/25/17 HPI: Pt is a 12 M SNF resident with hx of schizophrenia, hypothyroidism sent to the ED from SNF with AMS, hypothermia, hypotension, bradycardia.  Diagnosed with sepsis, unclear source. Notably, he has had 2 identical admissions in past few months and was previously thought to have possible adrenal insufficiency. Previously, he has seemingly responded to stress dose steroids (in addition to  abx). Pt has a known history of BPH, CHF, COPD,  hypertension, rheumatoid arthritis, schizophrenia, thyroid disease- is a nursing home resident.  Type of Study: Bedside Swallow Evaluation Previous Swallow Assessment: none reported Diet Prior to this Study: NPO Temperature Spikes Noted: No(wbc 7.3) Respiratory Status: Nasal cannula(1-2 liters) History of Recent Intubation: No Behavior/Cognition: Alert;Cooperative;Pleasant mood;Confused;Distractible;Requires cueing;Doesn't follow directions(chuckled often; repeated "yeh") Oral Cavity Assessment: Dry Oral Care Completed by SLP: Recent completion by staff Oral Cavity - Dentition: Adequate natural dentition Vision: (unknown) Self-Feeding Abilities: Needs assist;Needs set up;Total assist(apparent RA in hands) Patient Positioning: Upright in bed Baseline Vocal Quality: Normal(in his few responses) Volitional Cough: Cognitively unable to elicit Volitional Swallow: Unable to elicit    Oral/Motor/Sensory Function Overall Oral Motor/Sensory Function: (did not follow through w/ OM tasks; appeared wfl w/ boluses)   Ice Chips Ice chips: Not tested   Thin Liquid Thin Liquid: Not tested    Nectar Thick Nectar Thick Liquid: Within functional limits Presentation: Cup;Straw(fed, pinched straw to limit amount d/t impulsivity) Other Comments: ~4 ozs total   Honey Thick Honey Thick Liquid: Not tested   Puree Puree: Within functional limits Presentation: Spoon(fed; ~3 ozs) Other Comments: distraction x2 appeared to cause inconsistent delay in A-P transfer   Solid   GO   Solid: Not tested Other Comments: d/t Cognitive status         Jerry Kenner, MS, CCC-SLP Jerry [name] 12/27/2017,3:54 PM

## 2017-12-27 NOTE — Progress Notes (Signed)
PHARMACIST - PHYSICIAN COMMUNICATION  CONCERNING: IV to Oral Route Change Policy  RECOMMENDATION: This patient is receiving levothyroxine by the intravenous route.  Based on criteria approved by the Pharmacy and Therapeutics Committee, the intravenous medication(s) is/are being converted to the equivalent oral dose form(s).   DESCRIPTION: These criteria include:  The patient is eating (either orally or via tube) and/or has been taking other orally administered medications for a least 24 hours  The patient has no evidence of active gastrointestinal bleeding or impaired GI absorption (gastrectomy, short bowel, patient on TNA or NPO).  If you have questions about this conversion, please contact the Pharmacy Department  []   [phone] )  [facility] [x]   [phone] )  [facility] []   [phone] )  [facility] []   [phone] )  [facility] []   [phone] )  [place], [facility] 12/27/2017 4:40 PM

## 2017-12-28 LAB — GLUCOSE, CAPILLARY
GLUCOSE-CAPILLARY: 87 mg/dL (ref 65–99)
GLUCOSE-CAPILLARY: 91 mg/dL (ref 65–99)
GLUCOSE-CAPILLARY: 106 mg/dL — AB (ref 65–99)
Glucose-Capillary: 93 mg/dL (ref 65–99)
Glucose-Capillary: 93 mg/dL (ref 65–99)
Glucose-Capillary: 87 mg/dL (ref 65–99)

## 2017-12-28 LAB — CORTISOL: Cortisol, Plasma: 7.8 ug/dL

## 2017-12-28 MED ORDER — HYDROCORTISONE NA SUCCINATE PF 100 MG IJ SOLR
50.0000 mg | Freq: Three times a day (TID) | INTRAMUSCULAR | Status: DC
  Administered 2017-12-28 – 2017-12-29 (×3): 50 mg via INTRAVENOUS
  Filled 2017-12-28: qty 2
  Filled 2017-12-28 (×2): qty 1
  Filled 2017-12-28: qty 2

## 2017-12-28 NOTE — Progress Notes (Signed)
  Speech Language Pathology Treatment: Dysphagia  Patient Details Name: Jerry [name] MRN: [id number] DOB: 25-Jan-1956 Today's Date: 12/28/2017 Time: 5374-8270 SLP Time Calculation (min) (ACUTE ONLY): 53 min  Assessment / Plan / Recommendation Clinical Impression  Pt seen today for ongoing assessment of toleration of dysphagia diet. Pt appears to present w/ oropharyngeal phase dysphagia w/ increased risk for aspiration - suspect pt's swallowing presentation is heavily impacted by his declined Cognitive status; impulsive behaviors. Pt required extensive support to position upright w/ head forward for oral intake. He exhibited impulsive drinking behavior and was easily distracted by the environment. Noted min oral holding, delayed A-P transfer, and overall decreased awareness of the bolus material in the mouth. Coughing was noted post trial x1 when pt was distracted. Overall, pt exhibited no immediate, overt s/s of aspiration when he consumed trials of Nectar liquids via straw and purees; vocal quality was clear b/t trials and there was no decline in O2 sats. SLP ensured small bites/sips slowly, and oral clearing; pinched straw to limit impulsive drinking. Pt required moderate verbal and tactile cues to attend to po tasks intermittently. Pt required feeding support d/t deficits in his hands(RA?). Recommend continuing a dysphagia level 1 diet w/ Nectar liquids; strict aspiration precautions; Pills in Puree - crushed as able; feeding support. ST services will f/u w/ trials to upgrade diet if appropriate while admitted. NSG would benefit from a dysphagia diet at discharge d/t degree of Cognitive decline. NSG updated.    HPI HPI: Pt is a 3 M SNF resident with hx of schizophrenia, hypothyroidism sent to the ED from SNF with AMS, hypothermia, hypotension, bradycardia.  Diagnosed with sepsis, unclear source. Notably, he has had 2 identical admissions in past few months and was previously thought to have possible  adrenal insufficiency. Previously, he has seemingly responded to stress dose steroids (in addition to abx). Pt has a known history of BPH, CHF, COPD, hypertension, rheumatoid arthritis, schizophrenia, thyroid disease- is a nursing home resident.       SLP Plan  Continue with current plan of care       Recommendations  Diet recommendations: Dysphagia 1 (puree);Nectar-thick liquid Liquids provided via: Cup;Straw(pinch to monitor flow, amount) Medication Administration: Crushed with puree(as able) Supervision: Staff to assist with self feeding;Full supervision/cueing for compensatory strategies Compensations: Minimize environmental distractions;Slow rate;Small sips/bites;Lingual sweep for clearance of pocketing;Multiple dry swallows after each bite/sip;Follow solids with liquid Postural Changes and/or Swallow Maneuvers: Seated upright 90 degrees;Upright 30-60 min after meal                General recommendations: (Palliative Care consult) Oral Care Recommendations: Oral care BID;Staff/trained caregiver to provide oral care Follow up Recommendations: Skilled Nursing facility SLP Visit Diagnosis: Dysphagia, oropharyngeal phase (R13.12) Plan: Continue with current plan of care       [place], Advance, CCC-SLP Watson,Katherine 12/28/2017, 1:35 PM

## 2017-12-28 NOTE — Progress Notes (Signed)
Pharmacy Electrolyte Monitoring Consult:  Pharmacy consulted to assist in monitoring and replacing electrolytes in this 61 y.o. male admitted on 12/25/2017 with Altered Mental Status   Labs:  Sodium (mmol/L)  Date Value  12/27/2017 149 (H)  01/05/2013 140   Potassium (mmol/L)  Date Value  12/27/2017 3.8  01/05/2013 3.8   Magnesium (mg/dL)  Date Value  12/26/2017 2.0   Phosphorus (mg/dL)  Date Value  12/26/2017 3.2   Calcium (mg/dL)  Date Value  12/27/2017 9.0   Calcium, Total (mg/dL)  Date Value  01/05/2013 8.5   Albumin (g/dL)  Date Value  12/25/2017 3.3 (L)  01/05/2013 2.9 (L)    Plan:  1. No replacement warranted at this time. Will recheck electrolytes with am labs.   2. Patient requiring dextrose 10% infusion. Will continue to follow along.   3. No documented bowel movement.  If not bowel movement by am on 2/2, will initiate senna/docusate 2 tabs BID.   Pharmacy will continue to monitor and adjust per consult.  Simpson,Michael L 12/28/2017 9:16 PM

## 2017-12-28 NOTE — Progress Notes (Signed)
[facility] [place] NAME: Jerry [name]    MR#:  [id number]  DATE OF BIRTH:  1956-03-18  SUBJECTIVE:   Patient remains encephalopathic and confused which is likely his baseline. Off BiPAP. Seen by speech therapy and started on a dysphagia 1 diet with nectar thick liquids.  REVIEW OF SYSTEMS:   Review of Systems  Unable to perform ROS: Mental acuity    DRUG ALLERGIES:   Allergies  Allergen Reactions  . Methadone   . Penicillins     Tolerates cephalosporins   . Propoxyphene   . Sulfa Antibiotics   . Valproic Acid And Related     Thrombocytopenia    VITALS:  Blood pressure (!) 148/96, pulse 84, temperature (!) 96.8 F (36 C), resp. rate 13, height '6\' 2"'  (1.88 m), weight 135.2 kg (298 lb 1 oz), SpO2 97 %.  PHYSICAL EXAMINATION:   Physical Exam  GENERAL:  62 y.o.-year-old morbidly obese patient lying in the bed in no acute distress.   EYES: Pupils equal, round, reactive to light and accommodation. No scleral icterus. Extraocular muscles intact.  HEENT: Head atraumatic, normocephalic. Oropharynx and nasopharynx clear.  NECK:  Supple, no jugular venous distention. No thyroid enlargement, no tenderness.  LUNGS: Poor Resp. effort, no wheezing, rales, rhonchi. No use of accessory muscles of respiration.  CARDIOVASCULAR: S1, S2 normal. No murmurs, rubs, or gallops.  ABDOMEN: Soft, nontender, nondistended. Bowel sounds present. No organomegaly or mass.  EXTREMITIES: No cyanosis, clubbing or edema b/l.    NEUROLOGIC: CN II-XII intact, no focal motor or sensory deficits appreciate b/l.  Globally weak.  PSYCHIATRIC:  AAO X 1. Confused.  SKIN: No obvious rash, lesion, or ulcer.   LABORATORY PANEL:  CBC Recent Labs  Lab 12/27/17 0357  WBC 7.3  HGB 9.6*  HCT 29.4*  PLT 40*    Chemistries  Recent Labs  Lab 12/25/17 1538 12/26/17 0602 12/27/17 0357  NA 148* 146* 149*  K 4.4 4.3 3.8  CL 110 112* 115*  CO2 33* 27 27   GLUCOSE 94 84 80  BUN 37* 35* 29*  CREATININE 0.53* 0.86 0.62  CALCIUM 9.6 8.9 9.0  MG  --  2.0  --   AST 66*  --   --   ALT 54  --   --   ALKPHOS 126  --   --   BILITOT 0.5  --   --    Cardiac Enzymes Recent Labs  Lab 12/25/17 1538  TROPONINI <0.03   RADIOLOGY:  Dg Abd 1 View  Result Date: 12/27/2017 CLINICAL DATA:  Ingested foreign body. EXAM: ABDOMEN - 1 VIEW COMPARISON:  06/26/2017 FINDINGS: Airspace disease identified scratches that interstitial and airspace disease at the left base similar to chest x-ray earlier today. Diffuse gaseous bowel distention noted without an overt obstructive pattern. No evidence for radiopaque foreign body over the abdomen although study limited by patient body habitus and prominent stool. IMPRESSION: No definite foreign body identified although large body habitus and large volume stool/gas hinders assessment for nonmetallic foreign body. Electronically Signed   By: Misty Stanley M.D.   On: 12/27/2017 13:57   Dg Chest Port 1 View  Result Date: 12/27/2017 CLINICAL DATA:  Respiratory failure EXAM: PORTABLE CHEST 1 VIEW COMPARISON:  Two days ago FINDINGS: Low volume chest with patchy interstitial coarsening that is stable. Chronic cardiomegaly and vascular pedicle widening. Possible small pleural effusion on the left. No pneumothorax. IMPRESSION: 1. Stable low volume chest  with interstitial opacities bilaterally. Atelectasis, infection, or fibrosis could have this appearance. 2. Cardiomegaly. Electronically Signed   By: Monte Fantasia M.D.   On: 12/27/2017 08:12   ASSESSMENT AND PLAN:   Jevaun Strick  is a 62 y.o. male with a known history of BPH, CHF, COPD, hypertension, rheumatoid arthritis, schizophrenia, thyroid disease- is a nursing home resident and sent with sepsis. Patient is drowsy and not able to give any history.patient was noted to be hypoxic, hypotensive, hypothermic in ER.  1.  Sepsis -patient met sepsis criteria on admission given his  hypothermia, hypotension. - Sepsis has now been ruled out, there is no acute infectious source noted.  Patient's hypotension hypothermia is from presumed adrenal insufficiency. -Patient has been weaned off IV vasopressors, cont. Empiric Levaquin.  Cultures remain negative. - cont. Stress dose steroids and pt. Will need to be on Oral Hydrocortisone upon discharge.  Remains afebrile.  2.  Acute on chronic hypoxic respiratory failure secondary to COPD - weaned off Bipap now and stable.  - cont. Duonebs, Pulmicort nebs.   3.  Hypotension- secondary to presumed adrenal insufficiency. - Blood pressure has improved, continue IV steroids and will d/c on Oral Hydrcortisone.  Off vasopressors.  4.  Chronic diastolic congestive heart failure - clinically pt. Is not in CHF.   5. Hypothyroidism - cont. Synthroid.    6.  History of schizophrenia --At present patient is lethargic/confused.  Pt. Is on Prolixin, Cogentin and will hold meds for now given encephalopathy. ?? Consider psych consult to see if meds need to be adjusted.     Case discussed with Care Management/Social Worker. Management plans discussed with the patient, family and they are in agreement.  CODE STATUS: Full  DVT Prophylaxis: Ted & SCD's.   TOTAL TIME TAKING CARE OF THIS PATIENT: 25 minutes.   POSSIBLE D/C IN few DAYS, DEPENDING ON CLINICAL CONDITION.  Note: This dictation was prepared with Dragon dictation along with smaller phrase technology. Any transcriptional errors that result from this process are unintentional.  Henreitta Leber M.D on 12/28/2017 at 3:34 PM  Between 7am to 6pm - Pager - [phone]  After 6pm go to [email or web address] - Proofreader  [facility]  Office  [phone]  CC: Primary care physician; Housecalls, Doctors MakingPatient ID: [name], male   DOB: 09-26-1956, 62 y.o.   MRN: [id number]

## 2017-12-28 NOTE — Progress Notes (Signed)
PULMONARY / CRITICAL CARE MEDICINE   Name: Jerry [name] MRN: [id number] DOB: October 17, 1956    ADMISSION DATE:  12/25/2017   CONSULTATION DATE: 12/25/2017  REFERRING MD: Dr. Anselm Jungling  Reason: septic shock secondary to pneumonia  PT PROFILE:   46 M SNF resident with hx of schizophrenia, hypothyroidism sent to the ED from SNF with AMS, hypothermia, hypotension, bradycardia.  Diagnosed with sepsis, unclear source. Notably, he has had 2 identical admissions in past few months and was previously thought to have possible adrenal insufficiency. Previously, he has seemingly responded to stress dose steroids (in addition to abx).   MAJOR EVENTS/TEST RESULTS: 01/29 CT head: No acute findings  INDWELLING DEVICES:   MICRO DATA: MRSA PCR 1/29 >> POS Urine 1/29 >>  Resp 1/29 >>  Blood 1/29 >>   ANTIMICROBIALS:  Vanc 01/29 >> 01/31 Aztreonam 01/29 >> 01/30 Cefepime 01/30 >> 01/31 Levofloxacin 01/29 >>    SUBJECTIVE:  RASS 0.  Follows commands.  Denies pain and shortness of breath.   VITAL SIGNS: BP (!) 148/96   Pulse 84   Temp (!) 96.8 F (36 C)   Resp 13   Ht 6\' 2"  (1.88 m)   Wt 298 lb 1 oz (135.2 kg)   SpO2 97%   BMI 38.27 kg/m   HEMODYNAMICS:    VENTILATOR SETTINGS: FiO2 (%):  [21 %] 21 %  INTAKE / OUTPUT: I/O last 3 completed shifts: In: 2256.3 [P.O.:240; I.V.:2016.3] Out: 1115 [Urine:1115] PHYSICAL EXAMINATION: General: Morbidly obese, NAD Neuro: Cranial nerves intact, moves all extremities HEENT: NCAT, sclerae white, oropharynx normal Cardiovascular: RRR, no murmur Lungs: Clear anteriorly, no wheezes or other adventitious sounds i Abdomen: Obese, soft, NABS Ext: 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  No definite pneumonia   DISCUSSION: He has had repeated admissions with very similar presentations.   On none of these admissions have we definitively proven sepsis.  Notably, his PCT has been normal on every one of these admissions.  Previously, serum cortisol levels were inappropriately low suggesting possible adrenal insufficiency.  He has never had a formal cosyntropin stimulation test performed.  It is not clear if he was previously on chronic steroid therapy (for RA).  Nonetheless, he seems to improve significantly with stress dose steroids although it is difficult to separate his response out from a possible response to antibiotic therapy.  ASSESSMENT / PLAN: -Chronic hypercarbic respiratory failure -Obesity hypoventilation syndrome -Doubt pneumonia -Recurrent shock, resolved -as indicated above, I am more inclined to think that this is due to adrenal insufficiency -Hypothermia, resolved -Recurrent acute encephalopathy  -History of hypothyroidism -h/o schizophrenia - ? "burned out".  It is possible that his psychiatric medications are contributing to his repeated admissions that include altered mental status -acute encephalopathy, resolved -Acute on chronic thrombocytopenia -History of hypertension  -Transfer to MedSurg floor -Continue supplemental oxygen carefully titrated to keep SPO2 88-94% -Continue nocturnal BiPAP for OHS and presumed OSA -Discontinue vancomycin, cefepime -Continue levofloxacin for now -would consider discontinuation in the next couple of days if no infection definitively found -Recommend reevaluation of his psychiatric medications to assess whether he continues to require Prolixin, benztropine, trazodone, clonazepam -Recommend reevaluating antihypertensive regimen prior to discharge as these medications are almost certainly contributing to his repeated hospitalizations for hypotension -Continue levothyroxine and change to p.o. when able -At this point, as it seems unlikely that we will be  able to perform a formal cosyntropin stimulation test, I recommend empiric  therapy with hydrocortisone at 20 mg in the morning, 10 mg at night to be continued indefinitely  -Goals of care should be addressed.  He is an extremely poor candidate for intubation, ACLS, dialysis Recommend palliative care consult   After transfer, PCCM will sign off. Please call if we can be of further assistance     Corrin Parker, M.D.  [facility]  Medical Director [place] Director [facility] Cardio-Pulmonary Department

## 2017-12-28 NOTE — Progress Notes (Signed)
Patient pulling at Bi-Pap multiple times. Difficult to redirect and will not sleep for more than a few minutes at a time.

## 2017-12-28 NOTE — Progress Notes (Signed)
Chaplain was walking by Pt room and the Pt asked Ch to come. Ch stood at bedside and tried to understand Pt. Pt wanted Ch to assist. Ch summoned nurse to aid the pt. Ch continue on to visted other Pt.

## 2017-12-29 LAB — GLUCOSE, CAPILLARY
GLUCOSE-CAPILLARY: 105 mg/dL — AB (ref 65–99)
GLUCOSE-CAPILLARY: 114 mg/dL — AB (ref 65–99)
GLUCOSE-CAPILLARY: 83 mg/dL (ref 65–99)
Glucose-Capillary: 104 mg/dL — ABNORMAL HIGH (ref 65–99)
Glucose-Capillary: 95 mg/dL (ref 65–99)
Glucose-Capillary: 76 mg/dL (ref 65–99)

## 2017-12-29 LAB — PHOSPHORUS: PHOSPHORUS: 2.5 mg/dL (ref 2.5–4.6)

## 2017-12-29 LAB — BASIC METABOLIC PANEL
Anion gap: 6 (ref 5–15)
BUN: 14 mg/dL (ref 6–20)
CO2: 27 mmol/L (ref 22–32)
Calcium: 8.9 mg/dL (ref 8.9–10.3)
Chloride: 111 mmol/L (ref 101–111)
Creatinine, Ser: 0.62 mg/dL (ref 0.61–1.24)
GFR calc non Af Amer: >60 mL/min (ref >60)
GLUCOSE: 108 mg/dL — AB (ref 65–99)
Potassium: 3.6 mmol/L (ref 3.5–5.1)
SODIUM: 144 mmol/L (ref 135–145)

## 2017-12-29 LAB — MAGNESIUM: Magnesium: 2.1 mg/dL (ref 1.7–2.4)

## 2017-12-29 MED ORDER — BISACODYL 5 MG PO TBEC
10.0000 mg | DELAYED_RELEASE_TABLET | Freq: Every day | ORAL | Status: DC
  Administered 2017-12-29 – 2017-12-30 (×2): 10 mg via ORAL
  Filled 2017-12-29 (×2): qty 2

## 2017-12-29 MED ORDER — BENZTROPINE MESYLATE 0.5 MG PO TABS
0.5000 mg | ORAL_TABLET | Freq: Two times a day (BID) | ORAL | Status: DC
  Administered 2017-12-29 – 2017-12-30 (×3): 0.5 mg via ORAL
  Filled 2017-12-29 (×4): qty 1

## 2017-12-29 MED ORDER — HYDROXYCHLOROQUINE SULFATE 200 MG PO TABS
200.0000 mg | ORAL_TABLET | Freq: Two times a day (BID) | ORAL | Status: DC
  Administered 2017-12-29 – 2017-12-30 (×3): 200 mg via ORAL
  Filled 2017-12-29 (×3): qty 1

## 2017-12-29 MED ORDER — LEVOFLOXACIN 500 MG PO TABS
750.0000 mg | ORAL_TABLET | Freq: Every day | ORAL | Status: DC
Start: 2017-12-29 — End: 2017-12-30
  Administered 2017-12-29: 750 mg via ORAL
  Filled 2017-12-29: qty 2

## 2017-12-29 MED ORDER — MAGNESIUM CITRATE PO SOLN
1.0000 | Freq: Once | ORAL | Status: AC
  Administered 2017-12-29: 1 via ORAL
  Filled 2017-12-29: qty 296

## 2017-12-29 MED ORDER — FLUPHENAZINE HCL 2.5 MG PO TABS
15.0000 mg | ORAL_TABLET | Freq: Every day | ORAL | Status: DC
  Administered 2017-12-29: 15 mg via ORAL
  Filled 2017-12-29 (×2): qty 6

## 2017-12-29 MED ORDER — HYDROCORTISONE 10 MG PO TABS
10.0000 mg | ORAL_TABLET | Freq: Two times a day (BID) | ORAL | Status: DC
  Administered 2017-12-29 – 2017-12-30 (×3): 10 mg via ORAL
  Filled 2017-12-29 (×4): qty 1

## 2017-12-29 MED ORDER — TAMSULOSIN HCL 0.4 MG PO CAPS
0.4000 mg | ORAL_CAPSULE | Freq: Every day | ORAL | Status: DC
  Administered 2017-12-29 – 2017-12-30 (×2): 0.4 mg via ORAL
  Filled 2017-12-29 (×2): qty 1

## 2017-12-29 MED ORDER — SENNOSIDES-DOCUSATE SODIUM 8.6-50 MG PO TABS
2.0000 | ORAL_TABLET | Freq: Two times a day (BID) | ORAL | Status: DC
  Administered 2017-12-29 – 2017-12-30 (×3): 2 via ORAL
  Filled 2017-12-29 (×3): qty 2

## 2017-12-29 MED ORDER — ADULT MULTIVITAMIN W/MINERALS CH
1.0000 | ORAL_TABLET | Freq: Every day | ORAL | Status: DC
  Administered 2017-12-29 – 2017-12-30 (×2): 1 via ORAL
  Filled 2017-12-29 (×2): qty 1

## 2017-12-29 MED ORDER — SODIUM CHLORIDE 0.9% FLUSH: 10.0000 mL | INTRAVENOUS | Status: DC | PRN

## 2017-12-29 MED ORDER — VITAMIN C 500 MG PO TABS
500.0000 mg | ORAL_TABLET | Freq: Two times a day (BID) | ORAL | Status: DC
  Administered 2017-12-29 – 2017-12-30 (×3): 500 mg via ORAL
  Filled 2017-12-29 (×4): qty 1

## 2017-12-29 MED ORDER — FLUPHENAZINE HCL 2.5 MG PO TABS
12.5000 mg | ORAL_TABLET | Freq: Every morning | ORAL | Status: DC
  Administered 2017-12-30: 12.5 mg via ORAL
  Filled 2017-12-29: qty 5

## 2017-12-29 NOTE — Progress Notes (Signed)
Pharmacy Electrolyte Monitoring Consult:  Pharmacy consulted to assist in monitoring and replacing electrolytes in this 62 y.o. male admitted on 12/25/2017 with Altered Mental Status   Labs:  Sodium (mmol/L)  Date Value  12/29/2017 144  01/05/2013 140   Potassium (mmol/L)  Date Value  12/29/2017 3.6  01/05/2013 3.8   Magnesium (mg/dL)  Date Value  12/29/2017 2.1   Phosphorus (mg/dL)  Date Value  12/29/2017 2.5   Calcium (mg/dL)  Date Value  12/29/2017 8.9   Calcium, Total (mg/dL)  Date Value  01/05/2013 8.5   Albumin (g/dL)  Date Value  12/25/2017 3.3 (L)  01/05/2013 2.9 (L)    Plan:  1. Electrolytes WNL.  No replacement warranted at this time. Will recheck electrolytes with am labs.   2. Patient requiring dextrose 10% infusion. Will continue to follow along.   3. No documented bowel movement. Initiate senna/docusate 2 tabs BID.   Pharmacy will continue to monitor and adjust per consult.  Olivia Canter, [facility] 12/29/2017 7:42 AM

## 2017-12-29 NOTE — Progress Notes (Signed)
Patient transferred to room 140.

## 2017-12-29 NOTE — Progress Notes (Signed)
[facility] [place] NAME: Jerry [name]    MR#:  [id number]  DATE OF BIRTH:  [birth year]  SUBJECTIVE:   Patient more awake and communicative today. Off BiPAP. Seen by speech therapy and started on a dysphagia 1 diet with nectar thick liquids.  Not sure what his baseline is  REVIEW OF SYSTEMS:   Review of Systems  Unable to perform ROS: Mental acuity    DRUG ALLERGIES:   Allergies  Allergen Reactions  . Methadone   . Penicillins     Tolerates cephalosporins   . Propoxyphene   . Sulfa Antibiotics   . Valproic Acid And Related     Thrombocytopenia    VITALS:  Blood pressure (!) 160/80, pulse 87, temperature 98.9 F (37.2 C), temperature source Oral, resp. rate 20, height _0  (1.88 m), weight 135.2 kg (298 lb 1 oz), SpO2 98 %.  PHYSICAL EXAMINATION:   Physical Exam  GENERAL:  62 y.o.-year-old morbidly obese patient lying in the bed in no acute distress.  Morbidly obese EYES: Pupils equal, round, reactive to light and accommodation. No scleral icterus. Extraocular muscles intact.  HEENT: Head atraumatic, normocephalic. Oropharynx and nasopharynx clear.  NECK:  Supple, no jugular venous distention. No thyroid enlargement, no tenderness.  LUNGS: Poor Resp. effort, no wheezing, rales, rhonchi. No use of accessory muscles of respiration.  CARDIOVASCULAR: S1, S2 normal. No murmurs, rubs, or gallops.  ABDOMEN: Soft, nontender, nondistended. Bowel sounds present. No organomegaly or mass.  EXTREMITIES: No cyanosis, clubbing or edema b/l.    NEUROLOGIC: CN II-XII intact, no focal motor or sensory deficits appreciate b/l.  Globally weak.  PSYCHIATRIC:  AAO X 1. Confused.  SKIN: No obvious rash, lesion, or ulcer.   LABORATORY PANEL:  CBC Recent Labs  Lab 12/27/17 0357  WBC 7.3  HGB 9.6*  HCT 29.4*  PLT 40*    Chemistries  Recent Labs  Lab 12/25/17 1538  12/29/17 0327  NA 148*   < > 144  K 4.4   < > 3.6  CL 110   <  > 111  CO2 33*   < > 27  GLUCOSE 94   < > 108*  BUN 37*   < > 14  CREATININE 0.53*   < > 0.62  CALCIUM 9.6   < > 8.9  MG  --    < > 2.1  AST 66*  --   --   ALT 54  --   --   ALKPHOS 126  --   --   BILITOT 0.5  --   --    < > = values in this interval not displayed.   Cardiac Enzymes Recent Labs  Lab 12/25/17 1538  TROPONINI <0.03   RADIOLOGY:  Dg Abd 1 View  Result Date: 12/27/2017 CLINICAL DATA:  Ingested foreign body. EXAM: ABDOMEN - 1 VIEW COMPARISON:  06/26/2017 FINDINGS: Airspace disease identified scratches that interstitial and airspace disease at the left base similar to chest x-ray earlier today. Diffuse gaseous bowel distention noted without an overt obstructive pattern. No evidence for radiopaque foreign body over the abdomen although study limited by patient body habitus and prominent stool. IMPRESSION: No definite foreign body identified although large body habitus and large volume stool/gas hinders assessment for nonmetallic foreign body. Electronically Signed   By: Misty Stanley M.D.   On: 12/27/2017 13:57   ASSESSMENT AND PLAN:   Jerry [name]  is a 62 y.o. male with a  known history of BPH, CHF, COPD, hypertension, rheumatoid arthritis, schizophrenia, thyroid disease- is a nursing home resident and sent with sepsis. Patient is drowsy and not able to give any history.patient was noted to be hypoxic, hypotensive, hypothermic in ER.  1.  Sepsis -patient met sepsis criteria on admission given his hypothermia, hypotension. - Sepsis has now been ruled out, there is no acute infectious source noted.  Patient's hypotension hypothermia is from presumed adrenal insufficiency. -Patient has been weaned off IV vasopressors, cont. Empiric Levaquin.  Cultures remain negative. - cont. Stress dose steroids and pt. Will need to be on Oral Hydrocortisone upon discharge.--Changed to hydrocortisone 10 mg twice daily - remains afebrile.  2.  Acute on chronic hypoxic respiratory failure  secondary to COPD - weaned off Bipap now and stable.  - cont. Duonebs, Pulmicort nebs.   3.  Hypotension- secondary to presumed adrenal insufficiency. - Blood pressure has improved  4.  Chronic diastolic congestive heart failure - clinically pt. Is not in CHF.   5. Hypothyroidism - cont. Synthroid.    6.  History of schizophrenia --At present patient is lethargic/confused.  Pt. Is on Prolixin, Cogentin which I have resumed since patient's mentation is much improved. -Consider psych consultation if needed. -Holding patient's Klonopin for now  Discharge tomorrow if patient continues to show improvement.  He is a long-term resident at [facility].  Case discussed with Care Management/Social Worker. Management plans discussed with the patient, family and they are in agreement.  CODE STATUS: Full  DVT Prophylaxis: Ted & SCD's.   TOTAL TIME TAKING CARE OF THIS PATIENT: 25 minutes.   POSSIBLE D/C IN few DAYS, DEPENDING ON CLINICAL CONDITION.  Note: This dictation was prepared with Dragon dictation along with smaller phrase technology. Any transcriptional errors that result from this process are unintentional.  Fritzi Mandes M.D on 12/29/2017 at 1:23 PM  Between 7am to 6pm - Pager - [phone]  After 6pm go to [email or web address] - Proofreader  [facility] [place]  Office  [phone]  CC: Primary care physician; Housecalls, Doctors MakingPatient ID: Jerry [name], male   DOB: 31-Dec-1955, 62 y.o.   MRN: [id number]

## 2017-12-30 LAB — GLUCOSE, CAPILLARY
GLUCOSE-CAPILLARY: 88 mg/dL (ref 65–99)
Glucose-Capillary: 78 mg/dL (ref 65–99)
Glucose-Capillary: 79 mg/dL (ref 65–99)

## 2017-12-30 LAB — BASIC METABOLIC PANEL
Anion gap: 7 (ref 5–15)
BUN: 12 mg/dL (ref 6–20)
CHLORIDE: 111 mmol/L (ref 101–111)
CO2: 28 mmol/L (ref 22–32)
CREATININE: 0.68 mg/dL (ref 0.61–1.24)
Calcium: 8.6 mg/dL — ABNORMAL LOW (ref 8.9–10.3)
GFR calc Af Amer: >60 mL/min (ref >60)
GFR calc non Af Amer: >60 mL/min (ref >60)
GLUCOSE: 80 mg/dL (ref 65–99)
Potassium: 3.9 mmol/L (ref 3.5–5.1)
Sodium: 146 mmol/L — ABNORMAL HIGH (ref 135–145)

## 2017-12-30 LAB — CULTURE, BLOOD (ROUTINE X 2)
Culture: NO GROWTH
Culture: NO GROWTH
SPECIMEN DESCRIPTION: ADEQUATE
Special Requests: ADEQUATE

## 2017-12-30 LAB — MAGNESIUM: Magnesium: 2.3 mg/dL (ref 1.7–2.4)

## 2017-12-30 LAB — PHOSPHORUS: PHOSPHORUS: 2.5 mg/dL (ref 2.5–4.6)

## 2017-12-30 MED ORDER — HYDROCORTISONE 10 MG PO TABS: 10.0000 mg | ORAL_TABLET | Freq: Two times a day (BID) | ORAL | 0 refills | Status: DC

## 2017-12-30 MED ORDER — IPRATROPIUM-ALBUTEROL 0.5-2.5 (3) MG/3ML IN SOLN: 3.0000 mL | Freq: Two times a day (BID) | RESPIRATORY_TRACT | Status: DC

## 2017-12-30 NOTE — Progress Notes (Signed)
Report called to Belinda at [employer],  EMS notified for transport

## 2017-12-30 NOTE — Progress Notes (Signed)
EMS is here to transport the patient.  I was going to get a set of vital signs but EMS already had the patient on their stretcher when I went to the room and said they would check the vital signs in the truck

## 2017-12-30 NOTE — Progress Notes (Signed)
Pharmacy Electrolyte Monitoring Consult:  Pharmacy consulted to assist in monitoring and replacing electrolytes in this 61 y.o. male admitted on 12/25/2017 with Altered Mental Status   Labs:  Sodium (mmol/L)  Date Value  12/30/2017 146 (H)  01/05/2013 140   Potassium (mmol/L)  Date Value  12/30/2017 3.9  01/05/2013 3.8   Magnesium (mg/dL)  Date Value  12/30/2017 2.3   Phosphorus (mg/dL)  Date Value  12/30/2017 2.5   Calcium (mg/dL)  Date Value  12/30/2017 8.6 (L)   Calcium, Total (mg/dL)  Date Value  01/05/2013 8.5   Albumin (g/dL)  Date Value  12/25/2017 3.3 (L)  01/05/2013 2.9 (L)    Plan:  1. Electrolytes WNL.  No replacement warranted at this time. Will recheck electrolytes with am labs.   2. Dextrose 10% infusion discontinued 2/2.   3. Bowel movement 2/2. Continue bisacodyl 10mg  daily and senna/docusate 2 tabs BID.   Pharmacy will continue to monitor and adjust per consult.  Olivia Canter, [facility] 12/30/2017 7:53 AM

## 2017-12-30 NOTE — Plan of Care (Signed)
  Education: Knowledge of General Education information will improve 12/30/2017 0534 - Progressing by Thad Osoria, Lucille Passy, RN   Health Behavior/Discharge Planning: Ability to manage health-related needs will improve 12/30/2017 0534 - Progressing by Starlette Thurow, Lucille Passy, RN   Clinical Measurements: Ability to maintain clinical measurements within normal limits will improve 12/30/2017 0534 - Progressing by Twana Wileman, Lucille Passy, RN Will remain free from infection 12/30/2017 0534 - Progressing by Reiley Keisler, Lucille Passy, RN Diagnostic test results will improve 12/30/2017 0534 - Progressing by Ronia Hazelett, Lucille Passy, RN Respiratory complications will improve 12/30/2017 0534 - Progressing by Bryna Colander, RN Cardiovascular complication will be avoided 12/30/2017 0534 - Progressing by Azeem Poorman, Lucille Passy, RN

## 2017-12-30 NOTE — Clinical Social Work Note (Signed)
The patient will discharge to [facility] Room 507 (Nurse to call report to [facility] 500 nurse at [phone]) via non-emergent EMS due to total care ambulatory needs. The patient is aware and in agreement as is the facility. The facility has received all needed documentation, and the discharge packet has been delivered to the chart including the MOST form. CSW is signing off. Please consult should additional needs arise.  Santiago Bumpers, MSW, Latanya Presser [phone]

## 2017-12-30 NOTE — Discharge Summary (Signed)
[facility] at [place] NAME: Jerry [name]    MR#:  [id number]  DATE OF BIRTH:  August 28, 1956  DATE OF ADMISSION:  12/25/2017 ADMITTING PHYSICIAN: Vaughan Basta, MD  DATE OF DISCHARGE: 12/30/2017  PRIMARY CARE PHYSICIAN: [facility]    ADMISSION DIAGNOSIS:  Bradycardia [R00.1] HCAP (healthcare-associated pneumonia) [J18.9] Hypothermia, initial encounter [T68.XXXA] Septic shock ([place]) [A41.9, R65.21] Acute on chronic respiratory failure with hypercapnia (HCC) [J96.22] Urinary tract infection without hematuria, site unspecified [N39.0] Altered mental status, unspecified altered mental status type [R41.82]  DISCHARGE DIAGNOSIS:  AMS/Acute encephalopathy--resolved Sepsis on admission--resolved Hypotension --?adrenal insufficiency  SECONDARY DIAGNOSIS:   Past Medical History:  Diagnosis Date  . BPH (benign prostatic hyperplasia)   . CHF (congestive heart failure) ([place])   . Chronic pain   . COPD (chronic obstructive pulmonary disease) ([place])   . Hypertension   . Morbid obesity ([place])   . Pressure ulcer   . Rheumatoid arthritis ([place])   . Schizophrenia ([place])   . Thyroid disease     HOSPITAL COURSE:   [name] a62 y.o.malewith a known history of BPH, CHF, COPD, hypertension, rheumatoid arthritis, schizophrenia, thyroid disease- is a nursing home resident and sent with sepsis. Patient is drowsy and not able to give any history.patient was noted to be hypoxic, hypotensive, hypothermic in ER.  1. Sepsis -patient met sepsis criteria on admission given his hypothermia, hypotension. -  Patient's hypotension hypothermia is from presumed adrenal insufficiency. -Patient has been weaned off IV vasopressors, cont. Empiric Levaquin completed - Cultures remain negative. -Will need to be on Oral Hydrocortisone upon discharge.--Changed to hydrocortisone 10 mg twice daily x 10 days then [facility] for [facility]  assessment - remains afebrile.  2.  Acute on chronic hypoxic respiratory failure secondary to COPD - weaned off Bipap now and stable.  - cont. Duonebs, Pulmicort nebs.   3.  Hypotension- secondary to presumed adrenal insufficiency. - Blood pressure has improved -hold bp meds  4.  Chronic diastolic congestive heart failure - clinically pt. Is not in CHF.   5. Hypothyroidism - cont. Synthroid.    6.  History of schizophrenia --At present patient is awake and alert Pt. Is on Prolixin, Cogentin and klonopin which I have resumed since patient's mentation is much improved. -Consider psych consultation if needed.  7.Constipation pt had large BM y'day   Discharge today continues to show improvement.  [facility] is [facility].    CONSULTS OBTAINED:    DRUG ALLERGIES:   Allergies  Allergen Reactions  . Methadone   . Penicillins     Tolerates cephalosporins   . Propoxyphene   . Sulfa Antibiotics   . Valproic Acid And Related     Thrombocytopenia    DISCHARGE MEDICATIONS:   Allergies as of 12/30/2017      Reactions   Methadone    Penicillins    Tolerates cephalosporins   Propoxyphene    Sulfa Antibiotics    Valproic Acid And Related    Thrombocytopenia      Medication List    STOP taking these medications   amLODipine 10 MG tablet Commonly known as:  NORVASC   carvedilol 25 MG tablet Commonly known as:  COREG   cloNIDine 0.3 mg/24hr patch Commonly known as:  CATAPRES - Dosed in mg/24 hr     TAKE these medications   albuterol 108 (90 Base) MCG/ACT inhaler Commonly known as:  PROVENTIL HFA;VENTOLIN HFA Inhale 1 puff  into the lungs every 3 (three) hours as needed for wheezing or shortness of breath.   benztropine 0.5 MG tablet Commonly known as:  COGENTIN Take 0.5 mg by mouth 2 (two) times daily.   clonazePAM 1 MG tablet Commonly known as:  KLONOPIN Take 1 tablet (1 mg total) by mouth at bedtime.   docusate sodium 100 MG  capsule Commonly known as:  COLACE Take 100 mg by mouth 2 (two) times daily.   fluPHENAZine 2.5 MG tablet Commonly known as:  PROLIXIN Take 5 tablets (12.5 mg total) by mouth daily. Take 12.5 mg by mouth in the morning and 15 mg by mouth at bedtime. What changed:    how much to take  when to take this  additional instructions   hydrocortisone 10 MG tablet Commonly known as:  CORTEF Take 1 tablet (10 mg total) by mouth 2 (two) times daily.   hydroxychloroquine 200 MG tablet Commonly known as:  PLAQUENIL Take 200 mg by mouth 2 (two) times daily.   levothyroxine 200 MCG tablet Commonly known as:  SYNTHROID, LEVOTHROID Take 1 tablet (200 mcg total) by mouth daily before breakfast. What changed:  how much to take   methocarbamol 750 MG tablet Commonly known as:  ROBAXIN Take 750 mg by mouth at bedtime.   MIRALAX PO Take 17 g by mouth daily.   multivitamin with minerals tablet Take 1 tablet by mouth daily.   potassium chloride 10 MEQ CR capsule Commonly known as:  MICRO-K Take 10 mEq by mouth daily.   protein supplement shake Liqd Commonly known as:  PREMIER PROTEIN Take 325 mLs (11 oz total) by mouth 3 (three) times daily between meals.   tamsulosin 0.4 MG Caps capsule Commonly known as:  FLOMAX Take 0.4 mg by mouth daily.   torsemide 10 MG tablet Commonly known as:  DEMADEX Take 10 mg by mouth daily.   traZODone 150 MG tablet Commonly known as:  DESYREL Take 150 mg by mouth at bedtime.   vitamin C 250 MG tablet Commonly known as:  ASCORBIC ACID Take 500 mg by mouth 2 (two) times daily.       If you experience worsening of your admission symptoms, develop shortness of breath, life threatening emergency, suicidal or homicidal thoughts you must seek medical attention immediately by calling 911 or calling your MD immediately  if symptoms less severe.  You Must read complete instructions/literature along with all the possible adverse reactions/side effects  for all the Medicines you take and that have been prescribed to you. Take any new Medicines after you have completely understood and accept all the possible adverse reactions/side effects.   Please note  You were cared for by a hospitalist during your hospital stay. If you have any questions about your discharge medications or the care you received while you were in the hospital after you are discharged, you can call the unit and asked to speak with the hospitalist on call if the hospitalist that took care of you is not available. Once you are discharged, your primary care physician will handle any further medical issues. Please note that NO REFILLS for any discharge medications will be authorized once you are discharged, as it is imperative that you return to your primary care physician (or establish a relationship with a primary care physician if you do not have one) for your aftercare needs so that they can reassess your need for medications and monitor your lab values. Today   SUBJECTIVE   No new  complaints  VITAL SIGNS:  Blood pressure 138/66, pulse 88, temperature 99 F (37.2 C), resp. rate 20, height '6\' 2"'  (1.88 m), weight 135.2 kg (298 lb 1 oz), SpO2 93 %.  I/O:    Intake/Output Summary (Last 24 hours) at 12/30/2017 0719 Last data filed at 12/30/2017 0400 Gross per 24 hour  Intake 240 ml  Output -  Net 240 ml    PHYSICAL EXAMINATION:  GENERAL:  62 y.o.-year-old patient lying in the bed with no acute distress. obese EYES: Pupils equal, round, reactive to light and accommodation. No scleral icterus. Extraocular muscles intact.  HEENT: Head atraumatic, normocephalic. Oropharynx and nasopharynx clear.  NECK:  Supple, no jugular venous distention. No thyroid enlargement, no tenderness.  LUNGS: Normal breath sounds bilaterally, no wheezing, rales,rhonchi or crepitation. No use of accessory muscles of respiration.  CARDIOVASCULAR: S1, S2 normal. No murmurs, rubs, or gallops.  ABDOMEN:  Soft, non-tender, non-distended. Bowel sounds present. No organomegaly or mass.  EXTREMITIES: No pedal edema, cyanosis, or clubbing.  NEUROLOGIC: Cranial nerves II through XII are intact. Muscle strength 5/5 in all extremities. Sensation intact. Gait not checked.  PSYCHIATRIC: The patient is alert and oriented x 3.  SKIN: No obvious rash, lesion, or ulcer.   DATA REVIEW:   CBC  Recent Labs  Lab 12/27/17 0357  WBC 7.3  HGB 9.6*  HCT 29.4*  PLT 40*    Chemistries  Recent Labs  Lab 12/25/17 1538  12/30/17 0433  NA 148*   < > 146*  K 4.4   < > 3.9  CL 110   < > 111  CO2 33*   < > 28  GLUCOSE 94   < > 80  BUN 37*   < > 12  CREATININE 0.53*   < > 0.68  CALCIUM 9.6   < > 8.6*  MG  --    < > 2.3  AST 66*  --   --   ALT 54  --   --   ALKPHOS 126  --   --   BILITOT 0.5  --   --    < > = values in this interval not displayed.    Microbiology Results   Recent Results (from the past 240 hour(s))  Culture, blood (Routine x 2)     Status: None   Collection Time: 12/25/17  3:38 PM  Result Value Ref Range Status   Specimen Description BLOOD Blood Culture adequate volume  Final   Special Requests LEFT ANTECUBITAL  Final   Culture   Final    NO GROWTH 5 DAYS Performed at [facility], [address] [place], [place] [address]    Report Status 12/30/2017 FINAL  Final  Culture, blood (Routine x 2)     Status: None   Collection Time: 12/25/17  3:38 PM  Result Value Ref Range Status   Specimen Description BLOOD RIGHT ANTECUBITAL  Final   Special Requests   Final    BOTTLES DRAWN AEROBIC AND ANAEROBIC Blood Culture adequate volume   Culture   Final    NO GROWTH 5 DAYS Performed at [facility], [address], [place], [place] [address]    Report Status 12/30/2017 FINAL  Final  Urine culture     Status: Abnormal   Collection Time: 12/25/17  4:01 PM  Result Value Ref Range Status   Specimen Description   Final    URINE, RANDOM Performed at [facility], [address] [place], [place]  [address]    Special Requests   Final    NONE Performed at [facility], [address] [place], [place] [address]    Culture MULTIPLE SPECIES PRESENT, SUGGEST RECOLLECTION (A)  Final   Report Status 12/27/2017 FINAL  Final  MRSA PCR Screening     Status: Abnormal   Collection Time: 12/25/17  9:16 PM  Result Value Ref Range Status   MRSA by PCR POSITIVE (A) NEGATIVE Final    Comment:        The GeneXpert MRSA Assay (FDA approved for NASAL specimens only), is one component of a comprehensive MRSA colonization surveillance program. It is not intended to diagnose MRSA infection nor to guide or monitor treatment for MRSA infections. CRITICAL RESULT CALLED TO, READ BACK BY AND VERIFIED WITH: BARBARA THAO @ 2250 ON 12/25/2017 BY CAF Performed at [facility], [address] [place], [place] [address]     RADIOLOGY:  No results found.   Management plans discussed with the patient, family and they are in agreement.  CODE STATUS:     Code Status Orders  (From admission, onward)        Start     Ordered   12/25/17 2106  Full code  Continuous     12/25/17 2105    Code Status History    Date Active Date Inactive Code Status Order ID Comments User Context   08/02/2017 14:47 08/13/2017 18:44 Full Code [id number]  Bettey Costa, MD Inpatient   06/21/2017 15:27 07/02/2017 16:28 Full Code [id number]  Theodoro Grist, MD Inpatient      TOTAL TIME TAKING CARE OF THIS PATIENT: 40 minutes.    Fritzi Mandes M.D on 12/30/2017 at 7:19 AM  Between 7am to 6pm - Pager - [phone] After 6pm go to [email or web address] - Proofreader  [facility]  Office  [phone]  CC: Primary care physician; [facility]

## 2017-12-31 LAB — GLUCOSE, CAPILLARY: GLUCOSE-CAPILLARY: 85 mg/dL (ref 65–99)

## 2019-07-16 ENCOUNTER — Other Ambulatory Visit: Payer: Self-pay

## 2019-07-16 ENCOUNTER — Emergency Department: Payer: Medicaid Other

## 2019-07-16 ENCOUNTER — Inpatient Hospital Stay
Admission: EM | Admit: 2019-07-16 | Discharge: 2019-07-28 | DRG: 194 | Disposition: A | Discharge status: Skilled Nursing Facility | Payer: Medicaid Other | Attending: Internal Medicine | Admitting: Internal Medicine

## 2019-07-16 DIAGNOSIS — J189 Pneumonia, unspecified organism: Secondary | ICD-10-CM | POA: Diagnosis present

## 2019-07-16 DIAGNOSIS — F209 Schizophrenia, unspecified: Secondary | ICD-10-CM | POA: Diagnosis present

## 2019-07-16 DIAGNOSIS — Z6841 Body Mass Index (BMI) 40.0 and over, adult: Secondary | ICD-10-CM

## 2019-07-16 DIAGNOSIS — Z885 Allergy status to narcotic agent status: Secondary | ICD-10-CM

## 2019-07-16 DIAGNOSIS — K922 Gastrointestinal hemorrhage, unspecified: Secondary | ICD-10-CM | POA: Diagnosis present

## 2019-07-16 DIAGNOSIS — Z7952 Long term (current) use of systemic steroids: Secondary | ICD-10-CM

## 2019-07-16 DIAGNOSIS — Z888 Allergy status to other drugs, medicaments and biological substances status: Secondary | ICD-10-CM | POA: Diagnosis not present

## 2019-07-16 DIAGNOSIS — Z79890 Hormone replacement therapy (postmenopausal): Secondary | ICD-10-CM

## 2019-07-16 DIAGNOSIS — G8929 Other chronic pain: Secondary | ICD-10-CM | POA: Diagnosis present

## 2019-07-16 DIAGNOSIS — F203 Undifferentiated schizophrenia: Secondary | ICD-10-CM | POA: Diagnosis not present

## 2019-07-16 DIAGNOSIS — Z8711 Personal history of peptic ulcer disease: Secondary | ICD-10-CM

## 2019-07-16 DIAGNOSIS — D693 Immune thrombocytopenic purpura: Secondary | ICD-10-CM | POA: Diagnosis present

## 2019-07-16 DIAGNOSIS — D649 Anemia, unspecified: Secondary | ICD-10-CM | POA: Diagnosis not present

## 2019-07-16 DIAGNOSIS — Z20828 Contact with and (suspected) exposure to other viral communicable diseases: Secondary | ICD-10-CM | POA: Diagnosis present

## 2019-07-16 DIAGNOSIS — R0602 Shortness of breath: Secondary | ICD-10-CM | POA: Insufficient documentation

## 2019-07-16 DIAGNOSIS — M069 Rheumatoid arthritis, unspecified: Secondary | ICD-10-CM | POA: Diagnosis present

## 2019-07-16 DIAGNOSIS — Z66 Do not resuscitate: Secondary | ICD-10-CM | POA: Diagnosis not present

## 2019-07-16 DIAGNOSIS — R195 Other fecal abnormalities: Secondary | ICD-10-CM | POA: Diagnosis not present

## 2019-07-16 DIAGNOSIS — Z7401 Bed confinement status: Secondary | ICD-10-CM

## 2019-07-16 DIAGNOSIS — E039 Hypothyroidism, unspecified: Secondary | ICD-10-CM | POA: Diagnosis present

## 2019-07-16 DIAGNOSIS — Z7189 Other specified counseling: Secondary | ICD-10-CM | POA: Diagnosis not present

## 2019-07-16 DIAGNOSIS — I4510 Unspecified right bundle-branch block: Secondary | ICD-10-CM | POA: Diagnosis present

## 2019-07-16 DIAGNOSIS — D631 Anemia in chronic kidney disease: Secondary | ICD-10-CM | POA: Diagnosis present

## 2019-07-16 DIAGNOSIS — J449 Chronic obstructive pulmonary disease, unspecified: Secondary | ICD-10-CM | POA: Diagnosis not present

## 2019-07-16 DIAGNOSIS — R479 Unspecified speech disturbances: Secondary | ICD-10-CM | POA: Diagnosis not present

## 2019-07-16 DIAGNOSIS — J440 Chronic obstructive pulmonary disease with acute lower respiratory infection: Secondary | ICD-10-CM | POA: Diagnosis present

## 2019-07-16 DIAGNOSIS — D696 Thrombocytopenia, unspecified: Secondary | ICD-10-CM | POA: Diagnosis not present

## 2019-07-16 DIAGNOSIS — N400 Enlarged prostate without lower urinary tract symptoms: Secondary | ICD-10-CM | POA: Diagnosis present

## 2019-07-16 DIAGNOSIS — Z882 Allergy status to sulfonamides status: Secondary | ICD-10-CM

## 2019-07-16 DIAGNOSIS — I5032 Chronic diastolic (congestive) heart failure: Secondary | ICD-10-CM | POA: Diagnosis present

## 2019-07-16 DIAGNOSIS — I110 Hypertensive heart disease with heart failure: Secondary | ICD-10-CM | POA: Diagnosis present

## 2019-07-16 DIAGNOSIS — Z23 Encounter for immunization: Secondary | ICD-10-CM

## 2019-07-16 DIAGNOSIS — Z79899 Other long term (current) drug therapy: Secondary | ICD-10-CM

## 2019-07-16 DIAGNOSIS — J441 Chronic obstructive pulmonary disease with (acute) exacerbation: Secondary | ICD-10-CM | POA: Diagnosis present

## 2019-07-16 DIAGNOSIS — N179 Acute kidney failure, unspecified: Secondary | ICD-10-CM | POA: Diagnosis present

## 2019-07-16 DIAGNOSIS — E870 Hyperosmolality and hypernatremia: Secondary | ICD-10-CM | POA: Diagnosis not present

## 2019-07-16 DIAGNOSIS — Z880 Allergy status to penicillin: Secondary | ICD-10-CM

## 2019-07-16 DIAGNOSIS — R0902 Hypoxemia: Secondary | ICD-10-CM | POA: Diagnosis present

## 2019-07-16 DIAGNOSIS — R1310 Dysphagia, unspecified: Secondary | ICD-10-CM | POA: Diagnosis not present

## 2019-07-16 DIAGNOSIS — I2510 Atherosclerotic heart disease of native coronary artery without angina pectoris: Secondary | ICD-10-CM | POA: Diagnosis not present

## 2019-07-16 DIAGNOSIS — T380X5A Adverse effect of glucocorticoids and synthetic analogues, initial encounter: Secondary | ICD-10-CM | POA: Diagnosis not present

## 2019-07-16 DIAGNOSIS — Z515 Encounter for palliative care: Secondary | ICD-10-CM | POA: Diagnosis not present

## 2019-07-16 LAB — CBC WITH DIFFERENTIAL/PLATELET
Abs Immature Granulocytes: 0.04 10*3/uL (ref 0.00–0.07)
Basophils Absolute: 0 10*3/uL (ref 0.0–0.1)
Basophils Relative: 0 %
Eosinophils Absolute: 0.1 10*3/uL (ref 0.0–0.5)
Eosinophils Relative: 2 %
HCT: 28 % — ABNORMAL LOW (ref 39.0–52.0)
Hemoglobin: 8.4 g/dL — ABNORMAL LOW (ref 13.0–17.0)
Immature Granulocytes: 1 %
Lymphocytes Relative: 18 %
Lymphs Abs: 0.8 10*3/uL (ref 0.7–4.0)
MCH: 26.6 pg (ref 26.0–34.0)
MCHC: 30 g/dL (ref 30.0–36.0)
MCV: 88.6 fL (ref 80.0–100.0)
Monocytes Absolute: 0.4 10*3/uL (ref 0.1–1.0)
Monocytes Relative: 10 %
Neutro Abs: 2.9 10*3/uL (ref 1.7–7.7)
Neutrophils Relative %: 69 %
Platelets: 39 10*3/uL — ABNORMAL LOW (ref 150–400)
RBC: 3.16 MIL/uL — ABNORMAL LOW (ref 4.22–5.81)
RDW: 20.6 % — ABNORMAL HIGH (ref 11.5–15.5)
Smear Review: DECREASED
WBC: 4.2 10*3/uL (ref 4.0–10.5)
nRBC: 1.4 % — ABNORMAL HIGH (ref 0.0–0.2)

## 2019-07-16 LAB — PROCALCITONIN: Procalcitonin: <0.1 ng/mL

## 2019-07-16 LAB — BASIC METABOLIC PANEL
Anion gap: 9 (ref 5–15)
BUN: 41 mg/dL — ABNORMAL HIGH (ref 8–23)
CO2: 37 mmol/L — ABNORMAL HIGH (ref 22–32)
Calcium: 8.8 mg/dL — ABNORMAL LOW (ref 8.9–10.3)
Chloride: 95 mmol/L — ABNORMAL LOW (ref 98–111)
Creatinine, Ser: 1.61 mg/dL — ABNORMAL HIGH (ref 0.61–1.24)
GFR calc Af Amer: 52 mL/min — ABNORMAL LOW (ref >60)
GFR calc non Af Amer: 45 mL/min — ABNORMAL LOW (ref >60)
Glucose, Bld: 92 mg/dL (ref 70–99)
Potassium: 4.1 mmol/L (ref 3.5–5.1)
Sodium: 141 mmol/L (ref 135–145)

## 2019-07-16 LAB — RETICULOCYTES
Immature Retic Fract: 12.4 % (ref 2.3–15.9)
RBC.: 2.93 MIL/uL — ABNORMAL LOW (ref 4.22–5.81)
Retic Count, Absolute: 57.4 10*3/uL (ref 19.0–186.0)
Retic Ct Pct: 2 % (ref 0.4–3.1)

## 2019-07-16 LAB — IRON AND TIBC
Iron: 69 ug/dL (ref 45–182)
Saturation Ratios: 25 % (ref 17.9–39.5)
TIBC: 273 ug/dL (ref 250–450)
UIBC: 204 ug/dL

## 2019-07-16 LAB — LACTIC ACID, PLASMA
Lactic Acid, Venous: 1.1 mmol/L (ref 0.5–1.9)
Lactic Acid, Venous: 1 mmol/L (ref 0.5–1.9)

## 2019-07-16 LAB — FERRITIN: Ferritin: 184 ng/mL (ref 24–336)

## 2019-07-16 LAB — SARS CORONAVIRUS 2 BY RT PCR (HOSPITAL ORDER, PERFORMED IN ~~LOC~~ HOSPITAL LAB): SARS Coronavirus 2: NEGATIVE

## 2019-07-16 LAB — FOLATE: Folate: 14.1 ng/mL (ref >5.9)

## 2019-07-16 LAB — VITAMIN B12: Vitamin B-12: 1003 pg/mL — ABNORMAL HIGH (ref 180–914)

## 2019-07-16 LAB — GLUCOSE, CAPILLARY: Glucose-Capillary: 79 mg/dL (ref 70–99)

## 2019-07-16 MED ORDER — ONDANSETRON HCL 4 MG/2ML IJ SOLN: 4.0000 mg | Freq: Four times a day (QID) | INTRAMUSCULAR | Status: DC | PRN

## 2019-07-16 MED ORDER — ONDANSETRON HCL 4 MG PO TABS: 4.0000 mg | ORAL_TABLET | Freq: Four times a day (QID) | ORAL | Status: DC | PRN

## 2019-07-16 MED ORDER — SODIUM CHLORIDE 0.9 % IV SOLN
500.0000 mg | Freq: Once | INTRAVENOUS | Status: AC
  Administered 2019-07-16: 500 mg via INTRAVENOUS
  Filled 2019-07-16: qty 500

## 2019-07-16 MED ORDER — SODIUM CHLORIDE 0.9% FLUSH
3.0000 mL | Freq: Two times a day (BID) | INTRAVENOUS | Status: DC
  Administered 2019-07-16 – 2019-07-25 (×14): 3 mL via INTRAVENOUS

## 2019-07-16 MED ORDER — IPRATROPIUM-ALBUTEROL 0.5-2.5 (3) MG/3ML IN SOLN
3.0000 mL | RESPIRATORY_TRACT | Status: DC
  Administered 2019-07-16 – 2019-07-17 (×3): 3 mL via RESPIRATORY_TRACT
  Filled 2019-07-16 (×3): qty 3

## 2019-07-16 MED ORDER — SODIUM CHLORIDE 0.9 % IV SOLN
500.0000 mg | INTRAVENOUS | Status: DC
  Administered 2019-07-17: 19:00:00 500 mg via INTRAVENOUS
  Filled 2019-07-16 (×2): qty 500

## 2019-07-16 MED ORDER — SODIUM CHLORIDE 0.9 % IV BOLUS
1000.0000 mL | Freq: Once | INTRAVENOUS | Status: AC
  Administered 2019-07-16: 1000 mL via INTRAVENOUS

## 2019-07-16 MED ORDER — SODIUM CHLORIDE 0.9% FLUSH: 3.0000 mL | INTRAVENOUS | Status: DC | PRN

## 2019-07-16 MED ORDER — ACETAMINOPHEN 325 MG PO TABS
650.0000 mg | ORAL_TABLET | Freq: Four times a day (QID) | ORAL | Status: DC | PRN
  Administered 2019-07-20 – 2019-07-24 (×3): 650 mg via ORAL
  Filled 2019-07-16 (×4): qty 2

## 2019-07-16 MED ORDER — METHYLPREDNISOLONE SODIUM SUCC 125 MG IJ SOLR
60.0000 mg | Freq: Four times a day (QID) | INTRAMUSCULAR | Status: DC
  Administered 2019-07-16 – 2019-07-17 (×3): 60 mg via INTRAVENOUS
  Filled 2019-07-16 (×2): qty 2

## 2019-07-16 MED ORDER — PNEUMOCOCCAL VAC POLYVALENT 25 MCG/0.5ML IJ INJ
0.5000 mL | INJECTION | INTRAMUSCULAR | Status: AC
  Administered 2019-07-18: 10:00:00 0.5 mL via INTRAMUSCULAR
  Filled 2019-07-16: qty 0.5

## 2019-07-16 MED ORDER — SODIUM CHLORIDE 0.9 % IV SOLN
1.0000 g | INTRAVENOUS | Status: AC
  Administered 2019-07-17 – 2019-07-21 (×5): 1 g via INTRAVENOUS
  Filled 2019-07-16 (×5): qty 1

## 2019-07-16 MED ORDER — BUDESONIDE 0.25 MG/2ML IN SUSP
0.2500 mg | Freq: Two times a day (BID) | RESPIRATORY_TRACT | Status: DC
  Administered 2019-07-16 – 2019-07-19 (×6): 0.25 mg via RESPIRATORY_TRACT
  Filled 2019-07-16 (×6): qty 2

## 2019-07-16 MED ORDER — SODIUM CHLORIDE 0.9 % IV SOLN
2.0000 g | Freq: Once | INTRAVENOUS | Status: AC
  Administered 2019-07-16: 16:00:00 2 g via INTRAVENOUS
  Filled 2019-07-16: qty 20

## 2019-07-16 MED ORDER — SODIUM CHLORIDE 0.9 % IV SOLN
250.0000 mL | INTRAVENOUS | Status: DC | PRN
  Administered 2019-07-20: 17:00:00 250 mL via INTRAVENOUS

## 2019-07-16 MED ORDER — ACETAMINOPHEN 650 MG RE SUPP: 650.0000 mg | Freq: Four times a day (QID) | RECTAL | Status: DC | PRN

## 2019-07-16 NOTE — ED Notes (Signed)
Rectal exam completed, patient resulted heme positive. Pari care provided. Patient incontinent of b/b. Large soft stool output noted. Awaiting admission status.

## 2019-07-16 NOTE — H&P (Signed)
[facility] at [place] NAME: Jerry [name]    MR#:  [id number]  DATE OF BIRTH:  [birth year]  DATE OF ADMISSION:  07/16/2019  PRIMARY CARE PHYSICIAN: [facility]   REQUESTING/REFERRING PHYSICIAN:  Carrie Mew     CHIEF COMPLAINT:   Chief Complaint  Patient presents with  . Shortness of Breath    HISTORY OF PRESENT ILLNESS: Jerry [name]  is a 63 y.o. male with a known history of BPH, congestive heart failure, COPD, hypertension, morbid obesity, history of peptic ulcer disease, rheumatoid arthritis, schizophrenia and hypothyroidism who is sent from a nursing home due to patient having shortness of breath and hypoxia.  He had to be placed on oxygen in the ED.  I am told by the ED physician patient has pneumonia however his chest x-ray is negative his call with test is negative.  Patient unable to provide me with any review of systems.  He keeps staring at me.  Unclear what his baseline is. There has been some drop in patient's hemoglobin although his stools were brown he was guaiac positive per the ED MD.  PAST MEDICAL HISTORY:   Past Medical History:  Diagnosis Date  . BPH (benign prostatic hyperplasia)   . CHF (congestive heart failure) ([place])   . Chronic pain   . COPD (chronic obstructive pulmonary disease) ([place])   . Hypertension   . Morbid obesity ([place])   . Pressure ulcer   . Rheumatoid arthritis ([place])   . Schizophrenia ([place])   . Thyroid disease     PAST SURGICAL HISTORY: No past surgical history on file.  SOCIAL HISTORY:  Social History   Tobacco Use  . Smoking status: Never Smoker  . Smokeless tobacco: Never Used  Substance Use Topics  . Alcohol use: No    FAMILY HISTORY: No family history on file.  DRUG ALLERGIES:  Allergies  Allergen Reactions  . Methadone   . Penicillins     Tolerates cephalosporins   . Propoxyphene   . Sulfa Antibiotics   . Valproic Acid And Related     Thrombocytopenia     REVIEW OF SYSTEMS:   CONSTITUTIONAL:  Unable to obtain any review of systems due to patient's current mental status    MEDICATIONS AT HOME:  Prior to Admission medications   Medication Sig Start Date End Date Taking? Authorizing Provider  albuterol (PROVENTIL HFA;VENTOLIN HFA) 108 (90 Base) MCG/ACT inhaler Inhale 1 puff into the lungs every 3 (three) hours as needed for wheezing or shortness of breath.    [provider]  benztropine (COGENTIN) 0.5 MG tablet Take 0.5 mg by mouth 2 (two) times daily.    [provider]  clonazePAM (KLONOPIN) 1 MG tablet Take 1 tablet (1 mg total) by mouth at bedtime. 07/02/17   Max Sane, MD  docusate sodium (COLACE) 100 MG capsule Take 100 mg by mouth 2 (two) times daily.    [provider]  fluPHENAZine (PROLIXIN) 2.5 MG tablet Take 5 tablets (12.5 mg total) by mouth daily. Take 12.5 mg by mouth in the morning and 15 mg by mouth at bedtime. Patient taking differently: Take 15 mg by mouth 2 (two) times daily.  07/02/17   Max Sane, MD  hydrocortisone (CORTEF) 10 MG tablet Take 1 tablet (10 mg total) by mouth 2 (two) times daily. 12/30/17   Fritzi Mandes, MD  hydroxychloroquine (PLAQUENIL) 200 MG tablet Take 200 mg by mouth 2 (two) times daily.  [provider]  levothyroxine (SYNTHROID, LEVOTHROID) 200 MCG tablet Take 1 tablet (200 mcg total) by mouth daily before breakfast. Patient taking differently: Take 250 mcg by mouth daily before breakfast.  08/14/17   Epifanio Lesches, MD  methocarbamol (ROBAXIN) 750 MG tablet Take 750 mg by mouth at bedtime.    [provider]  Multiple Vitamins-Minerals (MULTIVITAMIN WITH MINERALS) tablet Take 1 tablet by mouth daily.    [provider]  Polyethylene Glycol 3350 (MIRALAX PO) Take 17 g by mouth daily.    [provider]  potassium chloride (MICRO-K) 10 MEQ CR capsule Take 10 mEq by mouth daily.    [provider]  protein supplement shake  (PREMIER PROTEIN) LIQD Take 325 mLs (11 oz total) by mouth 3 (three) times daily between meals. 08/13/17   Epifanio Lesches, MD  tamsulosin (FLOMAX) 0.4 MG CAPS capsule Take 0.4 mg by mouth daily.    [provider]  torsemide (DEMADEX) 10 MG tablet Take 10 mg by mouth daily.    [provider]  traZODone (DESYREL) 150 MG tablet Take 150 mg by mouth at bedtime.    [provider]  vitamin C (ASCORBIC ACID) 250 MG tablet Take 500 mg by mouth 2 (two) times daily.    [provider]      PHYSICAL EXAMINATION:   VITAL SIGNS: Blood pressure (!) 125/94, pulse (!) 58, temperature 97.8 F (36.6 C), temperature source Axillary, resp. rate 17, height 5\' 8"  (1.727 m), weight (!) 153.2 kg, SpO2 99 %.  GENERAL:  63 y.o.-year-old patient lying in the bed morbidly obese male EYES: Pupils equal, round, reactive to light and accommodation. No scleral icterus. Extraocular muscles intact.  HEENT: Head atraumatic, normocephalic. Oropharynx and nasopharynx clear.  NECK:  Supple, no jugular venous distention. No thyroid enlargement, no tenderness.  LUNGS: Diminished breath sounds bilaterally with wheezing.  CARDIOVASCULAR: S1, S2 normal. No murmurs, rubs, or gallops.  ABDOMEN: Soft, nontender, nondistended. Bowel sounds present. No organomegaly or mass.  EXTREMITIES: 1+ pedal edema, cyanosis, or clubbing.  NEUROLOGIC: Patient's eyes open not able to follow commands completely.  PSYCHIATRIC: The patient is alert not oriented SKIN: No obvious rash, lesion, or ulcer.   LABORATORY PANEL:   CBC Recent Labs  Lab 07/16/19 1350  WBC 4.2  HGB 8.4*  HCT 28.0*  PLT 39*  MCV 88.6  MCH 26.6  MCHC 30.0  RDW 20.6*  LYMPHSABS 0.8  MONOABS 0.4  EOSABS 0.1  BASOSABS 0.0   ------------------------------------------------------------------------------------------------------------------  Chemistries  Recent Labs  Lab 07/16/19 1350  NA 141  K 4.1  CL 95*  CO2 37*   GLUCOSE 92  BUN 41*  CREATININE 1.61*  CALCIUM 8.8*   ------------------------------------------------------------------------------------------------------------------ estimated creatinine clearance is 68 mL/min (A) (by C-G formula based on SCr of 1.61 mg/dL (H)). ------------------------------------------------------------------------------------------------------------------ No results for input(s): TSH, T4TOTAL, T3FREE, THYROIDAB in the last 72 hours.  Invalid input(s): FREET3   Coagulation profile No results for input(s): INR, PROTIME in the last 168 hours. ------------------------------------------------------------------------------------------------------------------- No results for input(s): DDIMER in the last 72 hours. -------------------------------------------------------------------------------------------------------------------  Cardiac Enzymes No results for input(s): CKMB, TROPONINI, MYOGLOBIN in the last 168 hours.  Invalid input(s): CK ------------------------------------------------------------------------------------------------------------------ Invalid input(s): POCBNP  ---------------------------------------------------------------------------------------------------------------  Urinalysis    Component Value Date/Time   COLORURINE YELLOW (A) 12/25/2017 1601   APPEARANCEUR HAZY (A) 12/25/2017 1601   APPEARANCEUR Hazy 01/19/2013 1635   LABSPEC 1.018 12/25/2017 1601   LABSPEC 1.008 01/19/2013 1635   PHURINE 5.0 12/25/2017 1601  GLUCOSEU NEGATIVE 12/25/2017 1601   GLUCOSEU Negative 01/19/2013 1635   HGBUR NEGATIVE 12/25/2017 1601   BILIRUBINUR NEGATIVE 12/25/2017 1601   BILIRUBINUR Negative 01/19/2013 1635   KETONESUR NEGATIVE 12/25/2017 1601   PROTEINUR NEGATIVE 12/25/2017 1601   NITRITE POSITIVE (A) 12/25/2017 1601   LEUKOCYTESUR LARGE (A) 12/25/2017 1601   LEUKOCYTESUR Negative 01/19/2013 1635     RADIOLOGY: Dg Chest Portable 1  View  Result Date: 07/16/2019 CLINICAL DATA:  Shortness of breath. EXAM: PORTABLE CHEST 1 VIEW COMPARISON:  Radiographs of December 27, 2017. FINDINGS: Stable cardiomegaly. Mild central pulmonary vascular congestion is noted. No pneumothorax is noted. Bilateral perihilar and basilar densities are noted which may represent scarring or fibrosis, but acute superimposed edema or inflammation cannot be excluded. No significant pleural effusion is noted. Bony thorax is unremarkable. IMPRESSION: Bilateral perihilar and basilar densities are noted which may represent scarring or fibrosis, but acute superimposed edema or inflammation cannot be excluded. Electronically Signed   By: Marijo Conception M.D.   On: 07/16/2019 14:30    EKG: Orders placed or performed during the hospital encounter of 07/16/19  . EKG 12-Lead  . EKG 12-Lead    IMPRESSION AND PLAN: Patient is a 63 year old nursing home resident brought in with shortness of breath and hypoxia  1.  Shortness of breath etiology unclear currently I will order a BNP level His WBC count is completely normal.  No fever noted unclear if this is pneumonia.  I will check a procalcitonin level Due to elevated creatinine I will obtain a CT scan of the chest without contrast I will treat him with nebulizers and steroid therapy due to bronchospasm he does have history of COPD we will treat as a COPD flare  2.  Acute on chronic anemia we will follow hemoglobin if further drop may need GI evaluation I will order a anemia panel  3.  Chronic thrombocytopenia  4.  Hypothyroidism patient's medications have been being updated  5.  CHF type unknown he does not appear overtly fluid overloaded we will see what the CT of the chest shows is done that we will decide on IV Lasix  6.  Thrombocytopenia which is chronic in nature he will need outpatient follow-up with hematology  7.  Miscellaneous SCDs for DVT prophylaxis due to severe thrombocytopenia   All the records  are reviewed and case discussed with ED provider. Management plans discussed with the patient, family and they are in agreement.  CODE STATUS: Code Status History    Date Active Date Inactive Code Status Order ID Comments User Context   12/25/2017 2105 12/30/2017 1646 Full Code [id number]  Vaughan Basta, MD Inpatient   08/02/2017 1447 08/13/2017 1844 Full Code [id number]  Bettey Costa, MD Inpatient   06/21/2017 1527 07/02/2017 1628 Full Code [id number]  Theodoro Grist, MD Inpatient   Advance Care Planning Activity       TOTAL TIME TAKING CARE OF THIS PATIENT:17minutes.    Dustin Flock M.D on 07/16/2019 at 5:11 PM  Between 7am to 6pm - Pager - [phone]  After 6pm go to [email or web address] - Proofreader  [facility] Office  [phone]  CC: Primary care physician; [facility]

## 2019-07-16 NOTE — ED Notes (Signed)
Labs abd 1st set of BLD cxs obtained and sent.

## 2019-07-16 NOTE — ED Triage Notes (Signed)
Sent from [facility] with c/o of SOB. Patient alert and obeys verbal stimuli. Patient non verbal, contracted of upper / lower body. Lungs auscultated with decreased breath sounds noted to left lower lobe. Auditory wheezes noted. Patient sating 81% on room air. 2lnc placed.

## 2019-07-16 NOTE — ED Notes (Signed)
Off unit to ct 

## 2019-07-16 NOTE — ED Notes (Signed)
returned from CT, patient soiled with bm. Peri care provided. Floor called for handoff.

## 2019-07-16 NOTE — ED Notes (Signed)
EKG performed.

## 2019-07-16 NOTE — ED Notes (Signed)
Transport called to tranfers patient to 132. Report given to receiving nurse. Nurse expecting patient. All questions and concerns answered.

## 2019-07-16 NOTE — ED Notes (Signed)
EKG to [facility] [place].

## 2019-07-16 NOTE — ED Notes (Signed)
Second IV line placed, 2nd blood culture drawn and sent. covid swab obtained and sent.

## 2019-07-16 NOTE — ED Provider Notes (Signed)
[facility] Emergency Department Provider Note  ____________________________________________   I have reviewed the triage vital signs and the nursing notes.   HISTORY  Chief Complaint Shortness of Breath   History limited by and level 5 caveat due to: nonverbal   HPI Jerry [name] is a 63 y.o. male who presents to the emergency department today because of concern for shortness of breath. Unfortunately the patient cannot give any history. Unclear how long it has been going on.    Records reviewed. Per medical record review patient has a history of COPD, CHF.   Past Medical History:  Diagnosis Date  . BPH (benign prostatic hyperplasia)   . CHF (congestive heart failure) ([place])   . Chronic pain   . COPD (chronic obstructive pulmonary disease) ([place])   . Hypertension   . Morbid obesity ([place])   . Pressure ulcer   . Rheumatoid arthritis ([place])   . Schizophrenia ([place])   . Thyroid disease     Patient Active Problem List   Diagnosis Date Noted  . Obesity hypoventilation syndrome ([place])   . Schizophrenia ([place])   . Palliative care by specialist   . Advance care planning   . Goals of care, counseling/discussion   . CHF (congestive heart failure) ([place])   . Altered mental status   . Septic shock ([place]) 08/02/2017  . Thrombocytopenia ([place]) 06/21/2017  . Leukopenia 06/21/2017  . Pressure injury of skin 06/21/2017    No past surgical history on file.  Prior to Admission medications   Medication Sig Start Date End Date Taking? Authorizing Provider  albuterol (PROVENTIL HFA;VENTOLIN HFA) 108 (90 Base) MCG/ACT inhaler Inhale 1 puff into the lungs every 3 (three) hours as needed for wheezing or shortness of breath.    [provider]  benztropine (COGENTIN) 0.5 MG tablet Take 0.5 mg by mouth 2 (two) times daily.    [provider]  clonazePAM (KLONOPIN) 1 MG tablet Take 1 tablet (1 mg total) by mouth at bedtime. 07/02/17   Max Sane, MD  docusate  sodium (COLACE) 100 MG capsule Take 100 mg by mouth 2 (two) times daily.    [provider]  fluPHENAZine (PROLIXIN) 2.5 MG tablet Take 5 tablets (12.5 mg total) by mouth daily. Take 12.5 mg by mouth in the morning and 15 mg by mouth at bedtime. Patient taking differently: Take 15 mg by mouth 2 (two) times daily.  07/02/17   Max Sane, MD  hydrocortisone (CORTEF) 10 MG tablet Take 1 tablet (10 mg total) by mouth 2 (two) times daily. 12/30/17   Fritzi Mandes, MD  hydroxychloroquine (PLAQUENIL) 200 MG tablet Take 200 mg by mouth 2 (two) times daily.    [provider]  levothyroxine (SYNTHROID, LEVOTHROID) 200 MCG tablet Take 1 tablet (200 mcg total) by mouth daily before breakfast. Patient taking differently: Take 250 mcg by mouth daily before breakfast.  08/14/17   Epifanio Lesches, MD  methocarbamol (ROBAXIN) 750 MG tablet Take 750 mg by mouth at bedtime.    [provider]  Multiple Vitamins-Minerals (MULTIVITAMIN WITH MINERALS) tablet Take 1 tablet by mouth daily.    [provider]  Polyethylene Glycol 3350 (MIRALAX PO) Take 17 g by mouth daily.    [provider]  potassium chloride (MICRO-K) 10 MEQ CR capsule Take 10 mEq by mouth daily.    [provider]  protein supplement shake (PREMIER PROTEIN) LIQD Take 325 mLs (11 oz total) by mouth 3 (three) times daily between meals. 08/13/17  Epifanio Lesches, MD  tamsulosin (FLOMAX) 0.4 MG CAPS capsule Take 0.4 mg by mouth daily.    [provider]  torsemide (DEMADEX) 10 MG tablet Take 10 mg by mouth daily.    [provider]  traZODone (DESYREL) 150 MG tablet Take 150 mg by mouth at bedtime.    [provider]  vitamin C (ASCORBIC ACID) 250 MG tablet Take 500 mg by mouth 2 (two) times daily.    [provider]    Allergies Methadone, Penicillins, Propoxyphene, Sulfa antibiotics, and Valproic acid and related  No family history on file.  Social  History Social History   Tobacco Use  . Smoking status: Never Smoker  . Smokeless tobacco: Never Used  Substance Use Topics  . Alcohol use: No  . Drug use: No    Review of Systems Unable to obtain secondary to non verbal status.   ____________________________________________   PHYSICAL EXAM:  VITAL SIGNS: ED Triage Vitals  Enc Vitals Group     BP 07/16/19 1358 (!) 125/94     Pulse Rate 07/16/19 1358 (!) 49     Resp 07/16/19 1358 16     Temp 07/16/19 1358 97.8 F (36.6 C)     Temp Source 07/16/19 1358 Axillary     SpO2 07/16/19 1358 99 %     Weight 07/16/19 1400 (!) 337 lb 11.2 oz (153.2 kg)     Height 07/16/19 1400 5\' 8"  (1.727 m)     Head Circumference --      [facility] Flow --      Pain Score 07/16/19 1400 0   Constitutional: Awake and alert.  Eyes: Conjunctivae are normal.  ENT      Head: Normocephalic and atraumatic.      Nose: No congestion/rhinnorhea.      Mouth/Throat: Mucous membranes are moist.      Neck: No stridor. Hematological/Lymphatic/Immunilogical: No cervical lymphadenopathy. Cardiovascular: Normal rate, regular rhythm.  No murmurs, rubs, or gallops.  Respiratory: Normal respiratory effort without tachypnea nor retractions. Breath sounds are clear and equal bilaterally. No wheezes/rales/rhonchi. Gastrointestinal: Soft and non tender. No rebound. No guarding.  Genitourinary: Deferred Musculoskeletal: Trace lower extremity edema.  Neurologic:  Non verbal. Awake and alert.  Skin:  Skin is warm, dry and intact. No rash noted.  ____________________________________________    LABS (pertinent positives/negatives)  Lactic 1.1 BMP na 141, k 4.1, gl 95, glu 92, cr 1.61  ____________________________________________   EKG  I, Nance Pear, attending physician, personally viewed and interpreted this EKG  EKG Time: 1349 Rate: 49 Rhythm: sinus bradycardia Axis: normal Intervals: qtc 459 QRS: RBBB ST changes: no st elevation Impression: abnormal  ekg  ____________________________________________    RADIOLOGY  CXR Bilateral perihilar and basilar densities  ____________________________________________   PROCEDURES  Procedures  ____________________________________________   INITIAL IMPRESSION / ASSESSMENT AND PLAN / ED COURSE  Pertinent labs & imaging results that were available during my care of the patient were reviewed by me and considered in my medical decision making (see chart for details).   Patient presented to the emergency department today because of concern for shortness of breath. Found to be hypoxic on room air. Patient unable to give any history. X-ray is concerning for perihilar and basilar densities. Given that patient is from living facility do have concern for possible covid.    ____________________________________________   FINAL CLINICAL IMPRESSION(S) / ED DIAGNOSES  Shortness of breath Hypoxia  Note: This dictation was prepared with Dragon dictation. Any transcriptional errors that  result from this process are unintentional     Nance Pear, MD 07/17/19 [id number]

## 2019-07-16 NOTE — ED Provider Notes (Signed)
Procedures  Clinical Course as of Jul 15 1717  Wed Jul 16, 2019  1613 COVID negative.  Has stable thrombocytopenia and chronic anemia.  Chemistry results show AK I with a creatinine of 1.6 compared to a baseline of 0.7.  The patient is not septic but having an acute respiratory illness and requires admission to the hospital for further management.  I ordered ceftriaxone and azithromycin for antibiotic therapy.  Continue IV fluids for hydration.   [PS]    Clinical Course User Index [PS] Carrie Mew, MD    ----------------------------------------- 5:18 PM on 07/16/2019 ----------------------------------------- Rectal exam shows brown stool, Hemoccult positive.  Platelets are at baseline.  Hemoglobin of 8.4 is decreased by 8 point compared to baseline hemoglobin of 9.5.. Not on any anticoagulants, not hemodynamically unstable.  Will defer management of occult GI bleed to hospitalist who is been contacted for admission.  Reviewing electronic medical record, it appears the patient is chronically experiencing psychiatric delusions, has very limited ability to provide history or participate in a neurologic exam or even follow commands and he appears to be at his baseline neurologic status at this time.  Final diagnoses:  Community acquired pneumonia, unspecified laterality  AKI (acute kidney injury) ([place])  Thrombocytopenia ([place])  Occult GI bleeding       Carrie Mew, MD 07/16/19 1720

## 2019-07-16 NOTE — ED Notes (Signed)
ED TO INPATIENT HANDOFF REPORT  ED Nurse Name and Phone #:   S Name/Age/Gender Jerry [name] 63 y.o. male Room/Bed: ED11A/ED11A  Code Status   Code Status: Full Code  Home/SNF/Other Nursing Home Patient oriented to: self Is this baseline? Yes   Triage Complete: Triage complete  Chief Complaint breathing difficulty  Triage Note Sent from [facility] with c/o of SOB. Patient alert and obeys verbal stimuli. Patient non verbal, contracted of upper / lower body. Lungs auscultated with decreased breath sounds noted to left lower lobe. Auditory wheezes noted. Patient sating 81% on room air. 2lnc placed.    Allergies Allergies  Allergen Reactions  . Methadone   . Penicillins     Tolerates cephalosporins   . Propoxyphene   . Sulfa Antibiotics   . Valproic Acid And Related     Thrombocytopenia    Level of Care/Admitting Diagnosis ED Disposition    ED Disposition Condition [facility]: [place] [100120]  Level of Care: Med-Surg [16]  Covid Evaluation: Asymptomatic Screening Protocol (No Symptoms)  Diagnosis: SOB (shortness of breath) [063016]  Admitting Physician: Dustin Flock [[id number]]  Attending Physician: Dustin Flock [[id number]]  Estimated length of stay: past midnight tomorrow  Certification:: I certify this patient will need inpatient services for at least 2 midnights  PT Class (Do Not Modify): Inpatient [101]  PT Acc Code (Do Not Modify): Private [1]       B Medical/Surgery History Past Medical History:  Diagnosis Date  . BPH (benign prostatic hyperplasia)   . CHF (congestive heart failure) ([place])   . Chronic pain   . COPD (chronic obstructive pulmonary disease) ([place])   . Hypertension   . Morbid obesity ([place])   . Pressure ulcer   . Rheumatoid arthritis ([place])   . Schizophrenia ([place])   . Thyroid disease    No past surgical history on file.   A IV Location/Drains/Wounds Patient Lines/Drains/Airways Status    Active Line/Drains/Airways    Name:   Placement date:   Placement time:   Site:   Days:   Peripheral IV 07/16/19 Right Forearm   07/16/19    1406    Forearm   less than 1   Peripheral IV 07/16/19 Right Wrist   07/16/19    1414    Wrist   less than 1          Intake/Output Last 24 hours  Intake/Output Summary (Last 24 hours) at 07/16/2019 1802 Last data filed at 07/16/2019 1732 Gross per 24 hour  Intake 1350 ml  Output -  Net 1350 ml    Labs/Imaging Results for orders placed or performed during the hospital encounter of 07/16/19 (from the past 48 hour(s))  Glucose, capillary     Status: None   Collection Time: 07/16/19  1:46 PM  Result Value Ref Range   Glucose-Capillary 79 70 - 99 mg/dL  CBC with Differential     Status: Abnormal   Collection Time: 07/16/19  1:50 PM  Result Value Ref Range   WBC 4.2 4.0 - 10.5 K/uL   RBC 3.16 (L) 4.22 - 5.81 MIL/uL   Hemoglobin 8.4 (L) 13.0 - 17.0 g/dL   HCT 28.0 (L) 39.0 - 52.0 %   MCV 88.6 80.0 - 100.0 fL   MCH 26.6 26.0 - 34.0 pg   MCHC 30.0 30.0 - 36.0 g/dL    Comment: CORRECTED FOR COLD AGGLUTININS   RDW 20.6 (H) 11.5 - 15.5 %  Platelets 39 (L) 150 - 400 K/uL    Comment: Immature Platelet Fraction may be clinically indicated, consider ordering this additional test [id number]    nRBC 1.4 (H) 0.0 - 0.2 %   Neutrophils Relative % 69 %   Neutro Abs 2.9 1.7 - 7.7 K/uL   Lymphocytes Relative 18 %   Lymphs Abs 0.8 0.7 - 4.0 K/uL   Monocytes Relative 10 %   Monocytes Absolute 0.4 0.1 - 1.0 K/uL   Eosinophils Relative 2 %   Eosinophils Absolute 0.1 0.0 - 0.5 K/uL   Basophils Relative 0 %   Basophils Absolute 0.0 0.0 - 0.1 K/uL   Smear Review PLATELETS APPEAR DECREASED     Comment: PLATELET COUNT CONFIRMED BY SMEAR   Immature Granulocytes 1 %   Abs Immature Granulocytes 0.04 0.00 - 0.07 K/uL    Comment: Performed at [facility], [address] [place], [place] [address]  Basic metabolic panel     Status: Abnormal    Collection Time: 07/16/19  1:50 PM  Result Value Ref Range   Sodium 141 135 - 145 mmol/L   Potassium 4.1 3.5 - 5.1 mmol/L   Chloride 95 (L) 98 - 111 mmol/L   CO2 37 (H) 22 - 32 mmol/L   Glucose, Bld 92 70 - 99 mg/dL   BUN 41 (H) 8 - 23 mg/dL   Creatinine, Ser 1.61 (H) 0.61 - 1.24 mg/dL   Calcium 8.8 (L) 8.9 - 10.3 mg/dL   GFR calc non Af Amer 45 (L) >60 mL/min   GFR calc Af Amer 52 (L) >60 mL/min   Anion gap 9 5 - 15    Comment: Performed at [facility], [address] [place], [place] [address]  SARS Coronavirus 2 [facility] order, Performed in [facility] hospital lab) Nasopharyngeal Nasopharyngeal Swab     Status: None   Collection Time: 07/16/19  1:50 PM   Specimen: Nasopharyngeal Swab  Result Value Ref Range   SARS Coronavirus 2 NEGATIVE NEGATIVE    Comment: (NOTE) If result is NEGATIVE SARS-CoV-2 target nucleic acids are NOT DETECTED. The SARS-CoV-2 RNA is generally detectable in upper and lower  respiratory specimens during the acute phase of infection. The lowest  concentration of SARS-CoV-2 viral copies this assay can detect is 250  copies / mL. A negative result does not preclude SARS-CoV-2 infection  and should not be used as the sole basis for treatment or other  patient management decisions.  A negative result may occur with  improper specimen collection / handling, submission of specimen other  than nasopharyngeal swab, presence of viral mutation(s) within the  areas targeted by this assay, and inadequate number of viral copies  (<250 copies / mL). A negative result must be combined with clinical  observations, patient history, and epidemiological information. If result is POSITIVE SARS-CoV-2 target nucleic acids are DETECTED. The SARS-CoV-2 RNA is generally detectable in upper and lower  respiratory specimens dur ing the acute phase of infection.  Positive  results are indicative of active infection with SARS-CoV-2.  Clinical  correlation with patient  history and other diagnostic information is  necessary to determine patient infection status.  Positive results do  not rule out bacterial infection or co-infection with other viruses. If result is PRESUMPTIVE POSTIVE SARS-CoV-2 nucleic acids MAY BE PRESENT.   A presumptive positive result was obtained on the submitted specimen  and confirmed on repeat testing.  While 2019 novel coronavirus  (SARS-CoV-2) nucleic acids may be present  in the submitted sample  additional confirmatory testing may be necessary for epidemiological  and / or clinical management purposes  to differentiate between  SARS-CoV-2 and other Sarbecovirus currently known to infect humans.  If clinically indicated additional testing with an alternate test  methodology [phone]) is advised. The SARS-CoV-2 RNA is generally  detectable in upper and lower respiratory sp ecimens during the acute  phase of infection. The expected result is Negative. Fact Sheet for Patients:  StrictlyIdeas.no Fact Sheet for Healthcare Providers: BankingDealers.co.za This test is not yet approved or cleared by the [place] FDA and has been authorized for detection and/or diagnosis of SARS-CoV-2 by FDA under an Emergency Use Authorization (EUA).  This EUA will remain in effect (meaning this test can be used) for the duration of the COVID-19 declaration under Section 564(b)(1) of the Act, 21 U.S.C. section 360bbb-3(b)(1), unless the authorization is terminated or revoked sooner. Performed at [facility], [address] [place], [place] [address]   Lactic acid, plasma     Status: None   Collection Time: 07/16/19  1:58 PM  Result Value Ref Range   Lactic Acid, Venous 1.1 0.5 - 1.9 mmol/L    Comment: Performed at [facility], [address], [place], [place] [address]   Dg Chest Portable 1 View  Result Date: 07/16/2019 CLINICAL DATA:  Shortness of breath. EXAM:  PORTABLE CHEST 1 VIEW COMPARISON:  Radiographs of December 27, 2017. FINDINGS: Stable cardiomegaly. Mild central pulmonary vascular congestion is noted. No pneumothorax is noted. Bilateral perihilar and basilar densities are noted which may represent scarring or fibrosis, but acute superimposed edema or inflammation cannot be excluded. No significant pleural effusion is noted. Bony thorax is unremarkable. IMPRESSION: Bilateral perihilar and basilar densities are noted which may represent scarring or fibrosis, but acute superimposed edema or inflammation cannot be excluded. Electronically Signed   By: Marijo Conception M.D.   On: 07/16/2019 14:30    Pending Labs Unresulted Labs (From admission, onward)    Start     Ordered   07/17/19 0500  Brain natriuretic peptide  Tomorrow morning,   STAT     07/16/19 1711   07/17/19 0500  CBC  Tomorrow morning,   STAT     07/16/19 1733   07/17/19 0076  Basic metabolic panel  Tomorrow morning,   STAT     07/16/19 1733   07/16/19 1734  HIV antibody (Routine Testing)  Once,   STAT     07/16/19 1733   07/16/19 1731  Vitamin B12  (Anemia Panel (PNL))  Once,   STAT     07/16/19 1730   07/16/19 1731  Folate  (Anemia Panel (PNL))  Once,   STAT     07/16/19 1730   07/16/19 1731  Iron and TIBC  (Anemia Panel (PNL))  Once,   STAT     07/16/19 1730   07/16/19 1731  Ferritin  (Anemia Panel (PNL))  Once,   STAT     07/16/19 1730   07/16/19 1731  Reticulocytes  (Anemia Panel (PNL))  Once,   STAT     07/16/19 1730   07/16/19 1712  Procalcitonin - Baseline  ONCE - STAT,   STAT     07/16/19 1711   07/16/19 1408  Lactic acid, plasma  Now then every 2 hours,   STAT     07/16/19 1407   07/16/19 1407  Blood culture (routine x 2)  BLOOD CULTURE X 2,   STAT  07/16/19 1407          Vitals/Pain Today's Vitals   07/16/19 1632 07/16/19 1648 07/16/19 1704 07/16/19 1720  BP:      Pulse: (!) 50 (!) 57 (!) 51 65  Resp: 13     Temp:      TempSrc:      SpO2: 100% 100%  100% 100%  Weight:      Height:      PainSc:        Isolation Precautions No active isolations  Medications Medications  sodium chloride flush (NS) 0.9 % injection 3 mL (has no administration in time range)  sodium chloride flush (NS) 0.9 % injection 3 mL (has no administration in time range)  0.9 %  sodium chloride infusion (has no administration in time range)  acetaminophen (TYLENOL) tablet 650 mg (has no administration in time range)    Or  acetaminophen (TYLENOL) suppository 650 mg (has no administration in time range)  ondansetron (ZOFRAN) tablet 4 mg (has no administration in time range)    Or  ondansetron (ZOFRAN) injection 4 mg (has no administration in time range)  ipratropium-albuterol (DUONEB) 0.5-2.5 (3) MG/3ML nebulizer solution 3 mL (has no administration in time range)  budesonide (PULMICORT) nebulizer solution 0.25 mg (has no administration in time range)  methylPREDNISolone sodium succinate (SOLU-MEDROL) 125 mg/2 mL injection 60 mg (has no administration in time range)  cefTRIAXone (ROCEPHIN) 1 g in sodium chloride 0.9 % 100 mL IVPB (has no administration in time range)  azithromycin (ZITHROMAX) 500 mg in sodium chloride 0.9 % 250 mL IVPB (has no administration in time range)  sodium chloride 0.9 % bolus 1,000 mL (0 mLs Intravenous Stopped 07/16/19 1702)  cefTRIAXone (ROCEPHIN) 2 g in sodium chloride 0.9 % 100 mL IVPB (0 g Intravenous Stopped 07/16/19 1702)  azithromycin (ZITHROMAX) 500 mg in sodium chloride 0.9 % 250 mL IVPB (0 mg Intravenous Stopped 07/16/19 1732)    Mobility non-ambulatory High fall risk   Focused Assessments medicine    R Recommendations: See Admitting Provider Note  Report given to:   Additional Notes:

## 2019-07-17 DIAGNOSIS — R479 Unspecified speech disturbances: Secondary | ICD-10-CM

## 2019-07-17 DIAGNOSIS — Z6841 Body Mass Index (BMI) 40.0 and over, adult: Secondary | ICD-10-CM

## 2019-07-17 DIAGNOSIS — F209 Schizophrenia, unspecified: Secondary | ICD-10-CM

## 2019-07-17 DIAGNOSIS — I2510 Atherosclerotic heart disease of native coronary artery without angina pectoris: Secondary | ICD-10-CM

## 2019-07-17 DIAGNOSIS — J449 Chronic obstructive pulmonary disease, unspecified: Secondary | ICD-10-CM

## 2019-07-17 DIAGNOSIS — R0602 Shortness of breath: Secondary | ICD-10-CM

## 2019-07-17 DIAGNOSIS — J189 Pneumonia, unspecified organism: Principal | ICD-10-CM

## 2019-07-17 DIAGNOSIS — D696 Thrombocytopenia, unspecified: Secondary | ICD-10-CM

## 2019-07-17 DIAGNOSIS — M069 Rheumatoid arthritis, unspecified: Secondary | ICD-10-CM

## 2019-07-17 DIAGNOSIS — D649 Anemia, unspecified: Secondary | ICD-10-CM

## 2019-07-17 LAB — BASIC METABOLIC PANEL
Anion gap: 6 (ref 5–15)
BUN: 39 mg/dL — ABNORMAL HIGH (ref 8–23)
CO2: 37 mmol/L — ABNORMAL HIGH (ref 22–32)
Calcium: 8.5 mg/dL — ABNORMAL LOW (ref 8.9–10.3)
Chloride: 102 mmol/L (ref 98–111)
Creatinine, Ser: 1.31 mg/dL — ABNORMAL HIGH (ref 0.61–1.24)
GFR calc Af Amer: >60 mL/min (ref >60)
GFR calc non Af Amer: 58 mL/min — ABNORMAL LOW (ref >60)
Glucose, Bld: 70 mg/dL (ref 70–99)
Potassium: 4.2 mmol/L (ref 3.5–5.1)
Sodium: 145 mmol/L (ref 135–145)

## 2019-07-17 LAB — CBC
HCT: 25.8 % — ABNORMAL LOW (ref 39.0–52.0)
Hemoglobin: 7.6 g/dL — ABNORMAL LOW (ref 13.0–17.0)
MCH: 26.3 pg (ref 26.0–34.0)
MCHC: 29.5 g/dL — ABNORMAL LOW (ref 30.0–36.0)
MCV: 89.3 fL (ref 80.0–100.0)
Platelets: 40 10*3/uL — ABNORMAL LOW (ref 150–400)
RBC: 2.89 MIL/uL — ABNORMAL LOW (ref 4.22–5.81)
RDW: 20.8 % — ABNORMAL HIGH (ref 11.5–15.5)
WBC: 4.5 10*3/uL (ref 4.0–10.5)
nRBC: 0.9 % — ABNORMAL HIGH (ref 0.0–0.2)

## 2019-07-17 LAB — TECHNOLOGIST SMEAR REVIEW

## 2019-07-17 LAB — MRSA PCR SCREENING: MRSA by PCR: POSITIVE — AB

## 2019-07-17 LAB — LACTATE DEHYDROGENASE: LDH: 239 U/L — ABNORMAL HIGH (ref 98–192)

## 2019-07-17 LAB — BRAIN NATRIURETIC PEPTIDE: B Natriuretic Peptide: 269 pg/mL — ABNORMAL HIGH (ref 0.0–100.0)

## 2019-07-17 MED ORDER — PANTOPRAZOLE SODIUM 40 MG PO TBEC
40.0000 mg | DELAYED_RELEASE_TABLET | Freq: Two times a day (BID) | ORAL | Status: DC
  Administered 2019-07-17 – 2019-07-28 (×14): 40 mg via ORAL
  Filled 2019-07-17 (×19): qty 1

## 2019-07-17 MED ORDER — PREDNISONE 50 MG PO TABS
50.0000 mg | ORAL_TABLET | Freq: Every day | ORAL | Status: DC
  Administered 2019-07-18 – 2019-07-28 (×10): 50 mg via ORAL
  Filled 2019-07-17 (×11): qty 1

## 2019-07-17 MED ORDER — IPRATROPIUM-ALBUTEROL 0.5-2.5 (3) MG/3ML IN SOLN
3.0000 mL | Freq: Four times a day (QID) | RESPIRATORY_TRACT | Status: DC
  Administered 2019-07-17 – 2019-07-18 (×4): 3 mL via RESPIRATORY_TRACT
  Filled 2019-07-17 (×4): qty 3

## 2019-07-17 NOTE — NC FL2 (Addendum)
[place] LEVEL OF CARE SCREENING TOOL     IDENTIFICATION  Patient Name: Jerry [name] Birthdate: [birth year] Sex: male Admission Date (Current Location): 07/16/2019  [place] and [place] Number:  Selena Lesser [id number] [place] and Address:  [facility], [address], [place], [place] [address]      Provider Number: [id number]  Attending Physician Name and Address:  Vaughan Basta, *  Relative Name and Phone Number:       Current Level of Care: Hospital Recommended Level of Care: [place] Prior Approval Number:    Date Approved/Denied:   PASRR Number: [id number]  Discharge Plan: SNF    Current Diagnoses: Patient Active Problem List   Diagnosis Date Noted  . SOB (shortness of breath) 07/16/2019  . Obesity hypoventilation syndrome ([place])   . Schizophrenia ([place])   . Palliative care by specialist   . Advance care planning   . Goals of care, counseling/discussion   . CHF (congestive heart failure) ([place])   . Altered mental status   . Septic shock ([place]) 08/02/2017  . Thrombocytopenia ([place]) 06/21/2017  . Leukopenia 06/21/2017  . Pressure injury of skin 06/21/2017    Orientation RESPIRATION BLADDER Height & Weight     Self, Place  O2(1 Liter Oxygen.) Incontinent Weight: (!) 337 lb 11.2 oz (153.2 kg) Height:  5\' 8"  (172.7 cm)  BEHAVIORAL SYMPTOMS/MOOD NEUROLOGICAL BOWEL NUTRITION STATUS      Continent Diet(Diet: DYS 1)  AMBULATORY STATUS COMMUNICATION OF NEEDS Skin   Extensive Assist Verbally Normal                       Personal Care Assistance Level of Assistance  Bathing, Feeding, Dressing Bathing Assistance: Limited assistance Feeding assistance: Limited assistance Dressing Assistance: Limited assistance     Functional Limitations Info  Sight, Hearing, Speech Sight Info: Adequate Hearing Info: Adequate Speech Info: Adequate    SPECIAL CARE FACTORS FREQUENCY                        Contractures      Additional Factors Info  Code Status, Allergies   Contact isolation for MRSA Nasal Swab.  Code Status Info: Full Code. Allergies Info: Methadone, Penicillins, Propoxyphene, Sulfa Antibiotics, Valproic Acid And Related           Current Medications (07/17/2019):  This is the current hospital active medication list Current Facility-Administered Medications  Medication Dose Route Frequency Provider Last Rate Last Dose  . 0.9 %  sodium chloride infusion  250 mL Intravenous PRN Dustin Flock, MD      . acetaminophen (TYLENOL) tablet 650 mg  650 mg Oral Q6H PRN Dustin Flock, MD       Or  . acetaminophen (TYLENOL) suppository 650 mg  650 mg Rectal Q6H PRN Dustin Flock, MD      . azithromycin (ZITHROMAX) 500 mg in sodium chloride 0.9 % 250 mL IVPB  500 mg Intravenous Q24H Dustin Flock, MD      . budesonide (PULMICORT) nebulizer solution 0.25 mg  0.25 mg Nebulization BID Dustin Flock, MD   0.25 mg at 07/17/19 0803  . cefTRIAXone (ROCEPHIN) 1 g in sodium chloride 0.9 % 100 mL IVPB  1 g Intravenous Q24H Dustin Flock, MD      . ipratropium-albuterol (DUONEB) 0.5-2.5 (3) MG/3ML nebulizer solution 3 mL  3 mL Nebulization Q4H Dustin Flock, MD   3 mL at 07/17/19 0803  . ondansetron (ZOFRAN) tablet 4 mg  4 mg Oral Q6H PRN Dustin Flock, MD       Or  . ondansetron (ZOFRAN) injection 4 mg  4 mg Intravenous Q6H PRN Dustin Flock, MD      . pantoprazole (PROTONIX) EC tablet 40 mg  40 mg Oral BID AC Vaughan Basta, MD      . pneumococcal 23 valent vaccine (PNU-IMMUNE) injection 0.5 mL  0.5 mL Intramuscular Tomorrow-1000 Dustin Flock, MD      . Derrill Memo ON 07/18/2019] predniSONE (DELTASONE) tablet 50 mg  50 mg Oral Q breakfast Vaughan Basta, MD      . sodium chloride flush (NS) 0.9 % injection 3 mL  3 mL Intravenous Q12H Dustin Flock, MD   3 mL at 07/16/19 2250  . sodium chloride flush (NS) 0.9 % injection 3 mL  3 mL Intravenous PRN Dustin Flock, MD         Discharge Medications: Please see discharge summary for a list of discharge medications.  Relevant Imaging Results:  Relevant Lab Results:   Additional Information SSN: [id number]  Dory Verdun, Veronia Beets, LCSW

## 2019-07-17 NOTE — Assessment & Plan Note (Addendum)
#  63 year old male patient with multiple medical problems including morbid obesity schizophrenia/rheumatoid arthritis COPD CHF is current admitted the hospital for worsening shortness of breath/pneumonia-noted to have thrombocytopenia/anemia  #Thrombocytopenia-chronic at least for the last 2 years to baseline around 40- 50,000.  Patient's platelets are 90s-improving on prednisone 50.  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  I think steroids could be weaned over the next 3 to 4 weeks.  CT scan shows no evidence of cirrhosis or splenomegaly/hypersplenism.   #Anemia-chronic baseline 9-10; normocytic-likely secondary to severe rheumatoid arthritis.  Recent worsening currently 7.5-will need GI evaluation after improvement of the acute issues/pneumonia.  A bone marrow biopsy could be considered however given patient's comorbidities/poor functional status I think is reasonable to hold off at this time.   #Schizophrenia/nonverbal-unclear baseline.   #Discussed with Dr. Darvin Neighbours.

## 2019-07-17 NOTE — TOC Initial Note (Signed)
Transition of Care [facility]) - Initial/Assessment Note    Patient Details  Name: Jerry [name] MRN: [id number] Date of Birth: 1956/06/10  [facility]) CM/SW Contact:    Jerry [name], Jerry [name] Phone Number: [phone]  07/17/2019, 4:01 PM  Clinical Narrative: Clinical Social Worker (CSW) received consult that patient is from a facility. Per chart patient is from [facility]. Per Jerry [name] [facility] liaison patient is a long term care resident at [facility]. Per Jerry [name] patient has been a resident at [facility] since 2013 and can return when stable for D/C. FL2 complete and faxed to [facility]. CSW attempted to meet with patient however he is not alert and oriented. CSW contacted patient's niece Jerry [name] listed on the facesheet. Per Jerry [name] she is patient's HPOA and lives in [place]. Jerry [name] reported that patient has family that live in [place] as well. Jerry [name] is agreeable for patient to return to [facility]. CSW will continue to follow and assist as needed.                   Expected Discharge Plan: Skilled Nursing Facility Barriers to Discharge: Continued Medical Work up   Patient Goals and CMS Choice        Expected Discharge Plan and Services Expected Discharge Plan: [place] In-house Referral: Clinical Social Work Discharge Planning Services: CM Consult   Living arrangements for the past 2 months: [place] Expected Discharge Date: 07/19/19               DME Arranged: N/A         HH Arranged: NA          Prior Living Arrangements/Services Living arrangements for the past 2 months: [place] Lives with:: Facility Resident Patient language and need for interpreter reviewed:: No Do you feel safe going back to the place where you live?: Yes      Need for Family Participation in Patient Care: Yes (Comment) Care giver support system in place?: Yes (comment)   Criminal Activity/Legal Involvement Pertinent to Current Situation/Hospitalization: No - Comment as  needed  Activities of Daily Living Home Assistive Devices/Equipment: Other (Comment)(SNF, TOTAL CARE) ADL Screening (condition at time of admission) Patient's cognitive ability adequate to safely complete daily activities?: No Is the patient deaf or have difficulty hearing?: No Does the patient have difficulty seeing, even when wearing glasses/contacts?: No Does the patient have difficulty concentrating, remembering, or making decisions?: Yes Patient able to express need for assistance with ADLs?: No Does the patient have difficulty dressing or bathing?: Yes Independently performs ADLs?: No Communication: Dependent Is this a change from baseline?: Pre-admission baseline Dressing (OT): Dependent Is this a change from baseline?: Pre-admission baseline Grooming: Dependent Is this a change from baseline?: Pre-admission baseline Feeding: Dependent Is this a change from baseline?: Pre-admission baseline Bathing: Dependent Is this a change from baseline?: Pre-admission baseline Toileting: Dependent Is this a change from baseline?: Pre-admission baseline In/Out Bed: Dependent Is this a change from baseline?: Pre-admission baseline Walks in Home: Dependent Is this a change from baseline?: Pre-admission baseline Does the patient have difficulty walking or climbing stairs?: Yes Weakness of Legs: Both Weakness of Arms/Hands: Both  Permission Sought/Granted Permission sought to share information with : Facility Art therapist granted to share information with : Yes, Verbal Permission Granted              Emotional Assessment Appearance:: Appears stated age     Orientation: : Oriented to Self, Fluctuating Orientation (Suspected and/or reported  Sundowners) Alcohol / Substance Use: Not Applicable Psych Involvement: No (comment)  Admission diagnosis:  Thrombocytopenia ([place]) [D69.6] AKI (acute kidney injury) ([place]) [N17.9] Occult GI bleeding [R19.5] Community  acquired pneumonia, unspecified laterality [J18.9] Patient Active Problem List   Diagnosis Date Noted  . SOB (shortness of breath) 07/16/2019  . Obesity hypoventilation syndrome ([place])   . Schizophrenia ([place])   . Palliative care by specialist   . Advance care planning   . Goals of care, counseling/discussion   . CHF (congestive heart failure) ([place])   . Altered mental status   . Septic shock ([place]) 08/02/2017  . Thrombocytopenia ([place]) 06/21/2017  . Leukopenia 06/21/2017  . Pressure injury of skin 06/21/2017   PCP:  [facility] Pharmacy:  No Pharmacies Listed    Social Determinants of Health (SDOH) Interventions    Readmission Risk Interventions No flowsheet data found.

## 2019-07-17 NOTE — Evaluation (Signed)
Clinical/Bedside Swallow Evaluation Patient Details  Name: Jerry [name] MRN: [id number] Date of Birth: Oct 01, 1956  Today's Date: 07/17/2019 Time: SLP Start Time (ACUTE ONLY): 1062 SLP Stop Time (ACUTE ONLY): 0936 SLP Time Calculation (min) (ACUTE ONLY): 41 min  Past Medical History:  Past Medical History:  Diagnosis Date  . BPH (benign prostatic hyperplasia)   . CHF (congestive heart failure) ([place])   . Chronic pain   . COPD (chronic obstructive pulmonary disease) ([place])   . Hypertension   . Morbid obesity ([place])   . Pressure ulcer   . Rheumatoid arthritis ([place])   . Schizophrenia ([place])   . Thyroid disease    Past Surgical History: History reviewed. No pertinent surgical history. HPI:  Per admitting H&P: "Jerry [name]  is a 63 y.o. male with a known history of BPH, congestive heart failure, COPD, hypertension, morbid obesity, history of peptic ulcer disease, rheumatoid arthritis, schizophrenia and hypothyroidism who is sent from a nursing home due to patient having shortness of breath and hypoxia.  He had to be placed on oxygen in the ED.  I am told by the ED physician patient has pneumonia however his chest x-ray is negative his call with test is negative.  Patient unable to provide me with any review of systems.  He keeps staring at me.  Unclear what his baseline is.   Assessment / Plan / Recommendation Clinical Impression  This pleasant, largely non verbal 63 y/o male presents with oropharyngeal dysphagia and impaired cognition. No family available to determine baseline cognitive status; however chart review reveals hx of cognitive impairment. Pt largely unable to answer simple y/n questions or follow simple 1 step directions, few verbalizations attempted; however clearly stated "thank you" at the end of evaluation. Oral phase c/b decreased oral strength & coordination resulting in decreased oral control and prep with all consistencies tested (nectar thick liquids, puree & soft solids) and  impaired mastication w/ soft solids c/b "munching" and no rotary movement. Mild to mod A-P transit delay with soft solids. L anterior spillage noted x1 w/sip nectar thick liquid. No oral residue noted with all trials. Pharyngeal phase c/b suspected delayed swallow initiation, and decreased laryngeal elevation. Upon entering room and throughout evaluation, noted audible congested respirations. Due to oral phase deficits and marked decreased cognition, pt at increased risk of aspiration.  Pt appeared to tolerate TSP's vs sips  Nectar liquids with improved oral control and safety as well as tsp's puree w/ no overt s/s aspiration, and no decline in respiratory status. Pt required verbal and tactile cues to attend to po tasks intermittently. Pt required total feeding support d/t deficits in his hands(RA?). Recommend initiation of a dysphagia level 1 (PUREE) diet w/ Nectar liquids; aspiration precautions; Pills crushed in Puree; total assist at meals. Rec: feed pt SLOWLY, limit distractions, feed ONLY BY TSP, with strict adherence to aspiration precautions.  ST services will f/u w/ trials to upgrade diet as appropriate while admitted. NSG updated.  SLP Visit Diagnosis: Dysphagia, oropharyngeal phase (R13.12)    Aspiration Risk  Moderate aspiration risk    Diet Recommendation Dysphagia 1 (Puree);Nectar-thick liquid   Liquid Administration via: Spoon Medication Administration: Crushed with puree Supervision: Full supervision/cueing for compensatory strategies Compensations: Minimize environmental distractions;Slow rate(Feed by TSP only, puree & Nectar thick liquids) Postural Changes: Seated upright at 90 degrees;Remain upright for at least 30 minutes after po intake    Other  Recommendations Oral Care Recommendations: Oral care QID Other Recommendations: Order thickener from pharmacy;Prohibited food (jello,  ice cream, thin soups);Remove water pitcher;Have oral suction available;Clarify dietary restrictions    Follow up Recommendations Skilled Nursing facility      Frequency and Duration min 2x/week  1 week       Prognosis Prognosis for Safe Diet Advancement: Fair Barriers to Reach Goals: Cognitive deficits;Severity of deficits      Swallow Study   General Date of Onset: 07/17/19 HPI: Per admitting H&P: "Jerry [name]  is a 63 y.o. male with a known history of BPH, congestive heart failure, COPD, hypertension, morbid obesity, history of peptic ulcer disease, rheumatoid arthritis, schizophrenia and hypothyroidism who is sent from a nursing home due to patient having shortness of breath and hypoxia.  He had to be placed on oxygen in the ED.  I am told by the ED physician patient has pneumonia however his chest x-ray is negative his call with test is negative.  Patient unable to provide me with any review of systems.  He keeps staring at me.  Unclear what his baseline is. Type of Study: Bedside Swallow Evaluation Diet Prior to this Study: NPO Temperature Spikes Noted: No Respiratory Status: Room air History of Recent Intubation: No Behavior/Cognition: Alert;Cooperative;Pleasant mood;Requires cueing;Doesn't follow directions Oral Cavity Assessment: Within Functional Limits Oral Care Completed by SLP: No Oral Cavity - Dentition: Adequate natural dentition Self-Feeding Abilities: Total assist;Other (Comment)(severe RA? unable to feed self) Patient Positioning: Upright in bed Baseline Vocal Quality: Breathy;Low vocal intensity Volitional Cough: Cognitively unable to elicit Volitional Swallow: Unable to elicit    Oral/Motor/Sensory Function Overall Oral Motor/Sensory Function: Moderate impairment Facial Symmetry: Within Functional Limits   Ice Chips Ice chips: Not tested   Thin Liquid Thin Liquid: Not tested    Nectar Thick Nectar Thick Liquid: Impaired Presentation: Cup;Spoon Oral Phase Impairments: Reduced labial seal;Reduced lingual movement/coordination Oral phase functional  implications: Left anterior spillage;Prolonged oral transit Pharyngeal Phase Impairments: Suspected delayed Swallow;Decreased hyoid-laryngeal movement   Honey Thick Honey Thick Liquid: Not tested   Puree Puree: Within functional limits Presentation: Spoon   Solid     Solid: Impaired Presentation: Spoon Oral Phase Impairments: Reduced lingual movement/coordination;Impaired mastication Oral Phase Functional Implications: Prolonged oral transit;Impaired mastication      Deana Krock, MA, CCC-SLP 07/17/2019,9:55 AM

## 2019-07-17 NOTE — Progress Notes (Signed)
Family Meeting Note  Advance Directive:yes  Today a meeting took place with the neice ( HCPOA).  Patient is unable to participate due YO:FVWAQL capacity Altered mental status   The following clinical team members were present during this meeting:MD  The following were discussed:Patient's diagnosis: Obese obesity, severe arthritis, bedbound status, pneumonia, thrombocytopenia, anemia and GI bleed, Patient's progosis: Unable to determine and Goals for treatment: Full Code  As per niece, she is POA and she would like him to be a full code in any adverse event.  Additional follow-up to be provided: GI, Oncology  Time spent during discussion:20 minutes  Vaughan Basta, MD

## 2019-07-17 NOTE — Consult Note (Signed)
Jerry [name] CONSULT NOTE  Patient Care Team: [facility] as PCP - General (Geriatric Medicine)  CHIEF COMPLAINTS/PURPOSE OF CONSULTATION: Anemia and thrombocytopenia  HISTORY OF PRESENTING ILLNESS: Patient is nonverbal/question because of schizophrenia Jerry [name] 63 y.o.  male with multiple medical problems including morbid obesity schizophrenia rheumatoid arthritis COPD CHF a nursing home resident is currently admitted to hospital for worsening shortness of breath.  Patient noted to have guaiac positive stool/and also drop in hemoglobin from baseline around 9 to 10-7.6.  Also noted to have thrombocytopenia platelets 40.  On CT scan patient noted to have-bilateral multifocal pneumonia groundglass opacities.  With regards to history of thrombocytopenia-patient has had chronic thrombocytopenia since 2018.  Platelets seem to be around baseline 40s- 50s.  Patient also has chronic anemia normocytic of unclear etiology for the last 2 years.  However the native hemoglobin today is 7.6.  Today iron studies show ferritin 184 saturation 95%.  Folic acid normal.  Patient has not had any imaging of his abdomen in the system so far.   Review of Systems  Unable to perform ROS: Patient nonverbal     MEDICAL HISTORY:  Past Medical History:  Diagnosis Date  . BPH (benign prostatic hyperplasia)   . CHF (congestive heart failure) ([place])   . Chronic pain   . COPD (chronic obstructive pulmonary disease) ([place])   . Hypertension   . Morbid obesity ([place])   . Pressure ulcer   . Rheumatoid arthritis ([place])   . Schizophrenia ([place])   . Thyroid disease     SURGICAL HISTORY: History reviewed. No pertinent surgical history.  SOCIAL HISTORY: Social History   Socioeconomic History  . Marital status: Single    Spouse name: Not on file  . Number of children: Not on file  . Years of education: Not on file  . Highest education level: Not on file  Occupational History  . Not on  file  Social Needs  . Financial resource strain: Not on file  . Food insecurity    Worry: Not on file    Inability: Not on file  . Transportation needs    Medical: Not on file    Non-medical: Not on file  Tobacco Use  . Smoking status: Never Smoker  . Smokeless tobacco: Never Used  Substance and Sexual Activity  . Alcohol use: No  . Drug use: No  . Sexual activity: Not on file  Lifestyle  . Physical activity    Days per week: Not on file    Minutes per session: Not on file  . Stress: Not on file  Relationships  . Social Herbalist on phone: Not on file    Gets together: Not on file    Attends religious service: Not on file    Active member of club or organization: Not on file    Attends meetings of clubs or organizations: Not on file    Relationship status: Not on file  . Intimate partner violence    Fear of current or ex partner: Not on file    Emotionally abused: Not on file    Physically abused: Not on file    Forced sexual activity: Not on file  Other Topics Concern  . Not on file  Social History Narrative  . Not on file    FAMILY HISTORY: History reviewed. No pertinent family history.  ALLERGIES:  is allergic to methadone; penicillins; propoxyphene; sulfa antibiotics; and valproic acid and related.  MEDICATIONS:  Current Facility-Administered Medications  Medication Dose Route Frequency Provider Last Rate Last Dose  . 0.9 %  sodium chloride infusion  250 mL Intravenous PRN Dustin Flock, MD      . acetaminophen (TYLENOL) tablet 650 mg  650 mg Oral Q6H PRN Dustin Flock, MD       Or  . acetaminophen (TYLENOL) suppository 650 mg  650 mg Rectal Q6H PRN Dustin Flock, MD      . azithromycin (ZITHROMAX) 500 mg in sodium chloride 0.9 % 250 mL IVPB  500 mg Intravenous Q24H Dustin Flock, MD      . budesonide (PULMICORT) nebulizer solution 0.25 mg  0.25 mg Nebulization BID Dustin Flock, MD   0.25 mg at 07/17/19 0803  . cefTRIAXone (ROCEPHIN) 1 g  in sodium chloride 0.9 % 100 mL IVPB  1 g Intravenous Q24H Dustin Flock, MD      . ipratropium-albuterol (DUONEB) 0.5-2.5 (3) MG/3ML nebulizer solution 3 mL  3 mL Nebulization Q6H Dustin Flock, MD   3 mL at 07/17/19 1400  . ondansetron (ZOFRAN) tablet 4 mg  4 mg Oral Q6H PRN Dustin Flock, MD       Or  . ondansetron (ZOFRAN) injection 4 mg  4 mg Intravenous Q6H PRN Dustin Flock, MD      . pantoprazole (PROTONIX) EC tablet 40 mg  40 mg Oral BID AC Vaughan Basta, MD      . pneumococcal 23 valent vaccine (PNU-IMMUNE) injection 0.5 mL  0.5 mL Intramuscular Tomorrow-1000 Dustin Flock, MD      . Derrill Memo ON 07/18/2019] predniSONE (DELTASONE) tablet 50 mg  50 mg Oral Q breakfast Vaughan Basta, MD      . sodium chloride flush (NS) 0.9 % injection 3 mL  3 mL Intravenous Q12H Dustin Flock, MD   3 mL at 07/16/19 2250  . sodium chloride flush (NS) 0.9 % injection 3 mL  3 mL Intravenous PRN Dustin Flock, MD          .  PHYSICAL EXAMINATION:  Vitals:   07/17/19 1401 07/17/19 1510  BP:  117/77  Pulse:  74  Resp:  18  Temp:  97.6 F (36.4 C)  SpO2: 95% 93%   Filed Weights   07/16/19 1400  Weight: (!) 337 lb 11.2 oz (153.2 kg)    Physical Exam  Constitutional: He is well-developed, well-nourished, and in no distress.  Patient is morbidly obese.  He is resting in the bed.  No acute distress.  He is nonverbal.  HENT:  Head: Normocephalic and atraumatic.  Mouth/Throat: Oropharynx is clear and moist. No oropharyngeal exudate.  Eyes: Pupils are equal, round, and reactive to light.  Neck: Normal range of motion. Neck supple.  Cardiovascular: Normal rate and regular rhythm.  Pulmonary/Chest: Effort normal and breath sounds normal. No respiratory distress. He has no wheezes.  Abdominal: Soft. Bowel sounds are normal. He exhibits no distension and no mass. There is no abdominal tenderness. There is no rebound and no guarding.  Musculoskeletal: Normal range of  motion.        General: No tenderness or edema.  Neurological: He is alert.  Nonverbal/question baseline.  Skin: Skin is warm.     LABORATORY DATA:  I have reviewed the data as listed Lab Results  Component Value Date   WBC 4.5 07/17/2019   HGB 7.6 (L) 07/17/2019   HCT 25.8 (L) 07/17/2019   MCV 89.3 07/17/2019   PLT 40 (L) 07/17/2019   Recent Labs    07/16/19 1350  07/17/19 0338  NA 141 145  K 4.1 4.2  CL 95* 102  CO2 37* 37*  GLUCOSE 92 70  BUN 41* 39*  CREATININE 1.61* 1.31*  CALCIUM 8.8* 8.5*  GFRNONAA 45* 58*  GFRAA 52* >60    RADIOGRAPHIC STUDIES: I have personally reviewed the radiological images as listed and agreed with the findings in the report. Ct Chest Wo Contrast  Result Date: 07/16/2019 CLINICAL DATA:  63 year old nursing home patient presenting with acute onset of shortness of breath, who has diminished breath sounds at the LEFT base and wheezing on auscultation. Hypoxemia with oxygen saturation of 81% on room air. EXAM: CT CHEST WITHOUT CONTRAST TECHNIQUE: Multidetector CT imaging of the chest was performed following the standard protocol without IV contrast. COMPARISON:  None. FINDINGS: Beam hardening streak artifact is present as the patient was unable to raise the arms and respiratory motion blurs many of the images. Cardiovascular: Heart mildly to moderately enlarged. Severe three-vessel coronary atherosclerosis. No significant pericardial effusion. Moderate atherosclerosis involving the thoracic and proximal abdominal aorta without evidence of aneurysm. Ectasia at the origin of the innominate artery. Mediastinum/Nodes: Scattered normal sized lymph nodes throughout the mediastinum, none of which are pathologically enlarged. No mediastinal masses. Normal appearing esophagus. Normal-appearing thyroid gland. Lungs/Pleura: Numerous areas of ground-glass and confluent airspace consolidation in both lungs involving all lobes. Peripheral blebs involving both lungs.  Narrowing of the RIGHT and LEFT mainstem bronchi with focal wall thickening. No pleural effusions. Upper Abdomen: Allowing for the beam hardening artifact, no acute findings in the visualized upper abdomen. Musculoskeletal: Degenerative changes involving the sternoclavicular joints and mild degenerative changes involving the thoracic spine. No acute findings. IMPRESSION: 1. Multilobar BILATERAL pneumonia with ground-glass and confluent airspace consolidation. 2. Narrowing of the RIGHT and LEFT mainstem bronchi associated with focal wall thickening, query severe bronchial inflammation. 3. Mild-to-moderate cardiomegaly. Severe three-vessel coronary atherosclerosis. Aortic Atherosclerosis (ICD10-I70.0) and Emphysema (ICD10-J43.9). Electronically Signed   By: Evangeline Dakin M.D.   On: 07/16/2019 20:38   Dg Chest Portable 1 View  Result Date: 07/16/2019 CLINICAL DATA:  Shortness of breath. EXAM: PORTABLE CHEST 1 VIEW COMPARISON:  Radiographs of December 27, 2017. FINDINGS: Stable cardiomegaly. Mild central pulmonary vascular congestion is noted. No pneumothorax is noted. Bilateral perihilar and basilar densities are noted which may represent scarring or fibrosis, but acute superimposed edema or inflammation cannot be excluded. No significant pleural effusion is noted. Bony thorax is unremarkable. IMPRESSION: Bilateral perihilar and basilar densities are noted which may represent scarring or fibrosis, but acute superimposed edema or inflammation cannot be excluded. Electronically Signed   By: Marijo Conception M.D.   On: 07/16/2019 14:30    Thrombocytopenia Jerry [name]) #63 year old male patient with multiple medical problems including morbid obesity schizophrenia/rheumatoid arthritis COPD CHF is current admitted the hospital for worsening shortness of breath/pneumonia-noted to have thrombocytopenia/anemia  #Thrombocytopenia-chronic at least for the last 2 years to baseline around 40 50,000.  Patient's platelets are  40,000.  The etiology is unclear-suspect related to liver disease/cirrhosis splenomegaly rather than any aggressive medical causes.  This could be worked up with a CT scan abdomen pelvis once active issues [possible GI bleed] is resolved/improved.  Will check a peripheral smear.  #Anemia-chronic baseline 9-10; normocytic.  Again unclear etiology liver disease/hypersplenism/rheumatoid arthritis.  Given the recent drop in hemoglobin/guaiac positive stool-await GI evaluation.  Transfuse if hemoglobin less than 7.5.  Monitor closely.  #Schizophrenia/nonverbal-unclear baseline.  Thank you Dr. Anselm Jungling for allowing me to participate in the care of your  pleasant patient. Please do not hesitate to contact me with questions or concerns in the interim.    All questions were answered. The patient knows to call the clinic with any problems, questions or concerns.   Cammie Sickle, MD 07/17/2019 4:09 PM

## 2019-07-17 NOTE — Progress Notes (Signed)
[place] [facility] [place] NAME: Jerry [name]    MR#:  [id number]  DATE OF BIRTH:  29-Aug-1956  SUBJECTIVE:  CHIEF COMPLAINT:   Chief Complaint  Patient presents with  . Shortness of Breath   Sent from [facility]-where he lives for last 7 years and is bedbound at his baseline due to significant and severe arthritis and schizophrenia. He was sent with shortness of breath and altered mental status.  He still not talking much to give much details. REVIEW OF SYSTEMS:  Due to altered mental status patient cannot give review of system. ROS  DRUG ALLERGIES:   Allergies  Allergen Reactions  . Methadone   . Penicillins     Tolerates cephalosporins   . Propoxyphene   . Sulfa Antibiotics   . Valproic Acid And Related     Thrombocytopenia    VITALS:  Blood pressure 117/77, pulse 74, temperature 97.6 F (36.4 C), resp. rate 18, height 5\' 8"  (1.727 m), weight (!) 153.2 kg, SpO2 93 %.  PHYSICAL EXAMINATION:  GENERAL:  63 y.o.-year-old obese patient lying in the bed with no acute distress.  EYES: Pupils equal, round, reactive to light and accommodation. No scleral icterus. Extraocular muscles intact.  HEENT: Head atraumatic, normocephalic. Oropharynx and nasopharynx clear.  NECK:  Supple, no jugular venous distention. No thyroid enlargement, no tenderness.  LUNGS: Normal breath sounds bilaterally, no wheezing, have some crepitation. No use of accessory muscles of respiration.   CARDIOVASCULAR: S1, S2 normal. No murmurs, rubs, or gallops.  ABDOMEN: Soft, nontender, nondistended. Bowel sounds present. No organomegaly or mass.  EXTREMITIES: No pedal edema, cyanosis, or clubbing.  Disuse atrophy and deformities present in both lower extremities and both hands have significant deformity due to arthritis. NEUROLOGIC: Cranial nerves II through XII are intact. Muscle strength 3/5 in both upper extremities 0/5 in both lower extremities. Sensation intact. Gait  not checked.  PSYCHIATRIC: The patient is alert and oriented x 0-not talking much.  SKIN: No obvious rash, lesion, or ulcer.   Physical Exam LABORATORY PANEL:   CBC Recent Labs  Lab 07/17/19 0338  WBC 4.5  HGB 7.6*  HCT 25.8*  PLT 40*   ------------------------------------------------------------------------------------------------------------------  Chemistries  Recent Labs  Lab 07/17/19 0338  NA 145  K 4.2  CL 102  CO2 37*  GLUCOSE 70  BUN 39*  CREATININE 1.31*  CALCIUM 8.5*   ------------------------------------------------------------------------------------------------------------------  Cardiac Enzymes No results for input(s): TROPONINI in the last 168 hours. ------------------------------------------------------------------------------------------------------------------  RADIOLOGY:  Ct Chest Wo Contrast  Result Date: 07/16/2019 CLINICAL DATA:  63 year old nursing home patient presenting with acute onset of shortness of breath, who has diminished breath sounds at the LEFT base and wheezing on auscultation. Hypoxemia with oxygen saturation of 81% on room air. EXAM: CT CHEST WITHOUT CONTRAST TECHNIQUE: Multidetector CT imaging of the chest was performed following the standard protocol without IV contrast. COMPARISON:  None. FINDINGS: Beam hardening streak artifact is present as the patient was unable to raise the arms and respiratory motion blurs many of the images. Cardiovascular: Heart mildly to moderately enlarged. Severe three-vessel coronary atherosclerosis. No significant pericardial effusion. Moderate atherosclerosis involving the thoracic and proximal abdominal aorta without evidence of aneurysm. Ectasia at the origin of the innominate artery. Mediastinum/Nodes: Scattered normal sized lymph nodes throughout the mediastinum, none of which are pathologically enlarged. No mediastinal masses. Normal appearing esophagus. Normal-appearing thyroid gland. Lungs/Pleura:  Numerous areas of ground-glass and confluent airspace consolidation in both lungs involving all  lobes. Peripheral blebs involving both lungs. Narrowing of the RIGHT and LEFT mainstem bronchi with focal wall thickening. No pleural effusions. Upper Abdomen: Allowing for the beam hardening artifact, no acute findings in the visualized upper abdomen. Musculoskeletal: Degenerative changes involving the sternoclavicular joints and mild degenerative changes involving the thoracic spine. No acute findings. IMPRESSION: 1. Multilobar BILATERAL pneumonia with ground-glass and confluent airspace consolidation. 2. Narrowing of the RIGHT and LEFT mainstem bronchi associated with focal wall thickening, query severe bronchial inflammation. 3. Mild-to-moderate cardiomegaly. Severe three-vessel coronary atherosclerosis. Aortic Atherosclerosis (ICD10-I70.0) and Emphysema (ICD10-J43.9). Electronically Signed   By: Evangeline Dakin M.D.   On: 07/16/2019 20:38   Dg Chest Portable 1 View  Result Date: 07/16/2019 CLINICAL DATA:  Shortness of breath. EXAM: PORTABLE CHEST 1 VIEW COMPARISON:  Radiographs of December 27, 2017. FINDINGS: Stable cardiomegaly. Mild central pulmonary vascular congestion is noted. No pneumothorax is noted. Bilateral perihilar and basilar densities are noted which may represent scarring or fibrosis, but acute superimposed edema or inflammation cannot be excluded. No significant pleural effusion is noted. Bony thorax is unremarkable. IMPRESSION: Bilateral perihilar and basilar densities are noted which may represent scarring or fibrosis, but acute superimposed edema or inflammation cannot be excluded. Electronically Signed   By: Marijo Conception M.D.   On: 07/16/2019 14:30    ASSESSMENT AND PLAN:   Patient is a 63 year old nursing home resident brought in with shortness of breath and hypoxia  1.  Shortness of breath Community-acquired pneumonia  Confirmed by CT scan of the chest without contrast On IV  antibiotics. I will treat him with nebulizers and steroid therapy due to bronchospasm he does have history of COPD we will treat as a COPD flare   2.  Acute on chronic anemia we will follow hemoglobin , as he had drop may need GI evaluation -though he is not a good candidate for procedure due to pneumonia currently.  3.  Chronic thrombocytopenia-unclear etiology, called hematology consult.  4.  Hypothyroidism patient's medications have been being updated  5.  CHF type unknown he does not appear overtly fluid overloaded .  6.  Thrombocytopenia which is chronic in nature   7.  Miscellaneous SCDs for DVT prophylaxis due to severe thrombocytopenia   8.  Bedbound status at baseline-due to severe arthritis, morbid obesity, schizophrenia-patient is bedbound but can feed himself.  He is a nursing home resident for last 7 years as per his niece.   All the records are reviewed and case discussed with Care Management/Social Workerr. Management plans discussed with the patient, family and they are in agreement.  CODE STATUS: Full  TOTAL TIME TAKING CARE OF THIS PATIENT: *35 minutes.     POSSIBLE D/C IN 1-2 DAYS, DEPENDING ON CLINICAL CONDITION.   Vaughan Basta M.D on 07/17/2019   Between 7am to 6pm - Pager - [phone]  After 6pm go to [email or web address] - password [email or web address] [facility]  Office  [phone]  CC: Primary care physician; [facility]  Note: This dictation was prepared with Dragon dictation along with smaller phrase technology. Any transcriptional errors that result from this process are unintentional.

## 2019-07-18 DIAGNOSIS — R195 Other fecal abnormalities: Secondary | ICD-10-CM

## 2019-07-18 LAB — TSH: TSH: 0.231 u[IU]/mL — ABNORMAL LOW (ref 0.350–4.500)

## 2019-07-18 LAB — CBC
HCT: 25.2 % — ABNORMAL LOW (ref 39.0–52.0)
Hemoglobin: 7.6 g/dL — ABNORMAL LOW (ref 13.0–17.0)
MCH: 26.7 pg (ref 26.0–34.0)
MCHC: 30.2 g/dL (ref 30.0–36.0)
MCV: 88.4 fL (ref 80.0–100.0)
Platelets: 53 10*3/uL — ABNORMAL LOW (ref 150–400)
RBC: 2.85 MIL/uL — ABNORMAL LOW (ref 4.22–5.81)
RDW: 21.3 % — ABNORMAL HIGH (ref 11.5–15.5)
WBC: 6.9 10*3/uL (ref 4.0–10.5)
nRBC: 1 % — ABNORMAL HIGH (ref 0.0–0.2)

## 2019-07-18 LAB — HIV ANTIBODY (ROUTINE TESTING W REFLEX): HIV Screen 4th Generation wRfx: NONREACTIVE

## 2019-07-18 MED ORDER — IPRATROPIUM-ALBUTEROL 0.5-2.5 (3) MG/3ML IN SOLN
3.0000 mL | Freq: Three times a day (TID) | RESPIRATORY_TRACT | Status: DC
  Administered 2019-07-18 – 2019-07-19 (×4): 3 mL via RESPIRATORY_TRACT
  Filled 2019-07-18 (×4): qty 3

## 2019-07-18 MED ORDER — AZITHROMYCIN 500 MG PO TABS
500.0000 mg | ORAL_TABLET | Freq: Every day | ORAL | Status: DC
  Administered 2019-07-18 – 2019-07-19 (×2): 500 mg via ORAL
  Filled 2019-07-18 (×3): qty 1

## 2019-07-18 MED ORDER — FLUPHENAZINE HCL 2.5 MG PO TABS: 12.5000 mg | ORAL_TABLET | Freq: Two times a day (BID) | ORAL | Status: DC

## 2019-07-18 MED ORDER — TAMSULOSIN HCL 0.4 MG PO CAPS
0.4000 mg | ORAL_CAPSULE | Freq: Every day | ORAL | Status: DC
  Administered 2019-07-18 – 2019-07-28 (×10): 0.4 mg via ORAL
  Filled 2019-07-18 (×11): qty 1

## 2019-07-18 MED ORDER — LEVOTHYROXINE SODIUM 200 MCG PO TABS
200.0000 ug | ORAL_TABLET | Freq: Every day | ORAL | Status: DC
  Administered 2019-07-19 – 2019-07-28 (×9): 200 ug via ORAL
  Filled 2019-07-18: qty 2
  Filled 2019-07-18: qty 1
  Filled 2019-07-18: qty 2
  Filled 2019-07-18 (×4): qty 1
  Filled 2019-07-18: qty 2
  Filled 2019-07-18 (×3): qty 1
  Filled 2019-07-18 (×5): qty 2
  Filled 2019-07-18 (×2): qty 1

## 2019-07-18 MED ORDER — SODIUM CHLORIDE 0.9 % IV SOLN
INTRAVENOUS | Status: DC
  Administered 2019-07-18 (×2): via INTRAVENOUS

## 2019-07-18 MED ORDER — VITAMIN C 500 MG PO TABS
500.0000 mg | ORAL_TABLET | Freq: Two times a day (BID) | ORAL | Status: DC
  Administered 2019-07-18 – 2019-07-28 (×15): 500 mg via ORAL
  Filled 2019-07-18 (×20): qty 1

## 2019-07-18 MED ORDER — CHLORHEXIDINE GLUCONATE CLOTH 2 % EX PADS
6.0000 | MEDICATED_PAD | Freq: Every day | CUTANEOUS | Status: AC
  Administered 2019-07-19 – 2019-07-23 (×5): 6 via TOPICAL

## 2019-07-18 MED ORDER — ADULT MULTIVITAMIN W/MINERALS CH
1.0000 | ORAL_TABLET | Freq: Every day | ORAL | Status: DC
  Administered 2019-07-19 – 2019-07-28 (×6): 1 via ORAL
  Filled 2019-07-18 (×10): qty 1

## 2019-07-18 MED ORDER — DOCUSATE SODIUM 100 MG PO CAPS
100.0000 mg | ORAL_CAPSULE | Freq: Two times a day (BID) | ORAL | Status: DC
  Administered 2019-07-18 – 2019-07-23 (×4): 100 mg via ORAL
  Filled 2019-07-18 (×10): qty 1

## 2019-07-18 MED ORDER — MUPIROCIN 2 % EX OINT
1.0000 "application " | TOPICAL_OINTMENT | Freq: Two times a day (BID) | CUTANEOUS | Status: AC
  Administered 2019-07-18 – 2019-07-22 (×10): 1 via NASAL
  Filled 2019-07-18: qty 22

## 2019-07-18 MED ORDER — FLUPHENAZINE HCL 5 MG PO TABS
15.0000 mg | ORAL_TABLET | Freq: Every day | ORAL | Status: DC
  Administered 2019-07-18 – 2019-07-27 (×9): 15 mg via ORAL
  Filled 2019-07-18 (×11): qty 3

## 2019-07-18 MED ORDER — HYDROCORTISONE 10 MG PO TABS
10.0000 mg | ORAL_TABLET | Freq: Two times a day (BID) | ORAL | Status: DC
  Administered 2019-07-18 – 2019-07-28 (×16): 10 mg via ORAL
  Filled 2019-07-18 (×22): qty 1

## 2019-07-18 MED ORDER — HYDROXYCHLOROQUINE SULFATE 200 MG PO TABS
200.0000 mg | ORAL_TABLET | Freq: Two times a day (BID) | ORAL | Status: DC
  Administered 2019-07-18 – 2019-07-28 (×15): 200 mg via ORAL
  Filled 2019-07-18 (×22): qty 1

## 2019-07-18 MED ORDER — BENZTROPINE MESYLATE 0.5 MG PO TABS
0.5000 mg | ORAL_TABLET | Freq: Two times a day (BID) | ORAL | Status: DC
  Administered 2019-07-18 – 2019-07-28 (×17): 0.5 mg via ORAL
  Filled 2019-07-18 (×22): qty 1

## 2019-07-18 MED ORDER — FLUPHENAZINE HCL 5 MG PO TABS
12.5000 mg | ORAL_TABLET | Freq: Every day | ORAL | Status: DC
  Administered 2019-07-19 – 2019-07-25 (×6): 12.5 mg via ORAL
  Filled 2019-07-18 (×8): qty 1

## 2019-07-18 MED ORDER — ALBUTEROL SULFATE (2.5 MG/3ML) 0.083% IN NEBU: 2.5000 mg | INHALATION_SOLUTION | RESPIRATORY_TRACT | Status: DC | PRN

## 2019-07-18 MED ORDER — METHOCARBAMOL 750 MG PO TABS
750.0000 mg | ORAL_TABLET | Freq: Every day | ORAL | Status: DC
  Administered 2019-07-18 – 2019-07-27 (×8): 750 mg via ORAL
  Filled 2019-07-18 (×12): qty 1

## 2019-07-18 MED ORDER — LEVOTHYROXINE SODIUM 25 MCG PO TABS
25.0000 ug | ORAL_TABLET | Freq: Every day | ORAL | Status: DC
  Administered 2019-07-19 – 2019-07-26 (×7): 25 ug via ORAL
  Filled 2019-07-18 (×7): qty 1

## 2019-07-18 MED ORDER — POTASSIUM CHLORIDE CRYS ER 10 MEQ PO TBCR
10.0000 meq | EXTENDED_RELEASE_TABLET | Freq: Every day | ORAL | Status: DC
  Administered 2019-07-18 – 2019-07-23 (×4): 10 meq via ORAL
  Filled 2019-07-18 (×7): qty 1

## 2019-07-18 NOTE — Progress Notes (Signed)
[place] at [place] NAME: Jerry [name]    MR#:  [id number]  DATE OF BIRTH:  July 24, 1956  SUBJECTIVE:  CHIEF COMPLAINT:   Chief Complaint  Patient presents with  . Shortness of Breath   Sent from [facility]-where he lives for last 7 years and is bedbound at his baseline due to significant and severe arthritis and schizophrenia. He was sent with shortness of breath and altered mental status.  He still not talking much to give much details.  remains mostly drowsy. REVIEW OF SYSTEMS:  Due to altered mental status patient cannot give review of system. ROS  DRUG ALLERGIES:   Allergies  Allergen Reactions  . Methadone   . Penicillins     Tolerates cephalosporins   . Propoxyphene   . Sulfa Antibiotics   . Valproic Acid And Related     Thrombocytopenia    VITALS:  Blood pressure 90/63, pulse 85, temperature (!) 97.5 F (36.4 C), temperature source Axillary, resp. rate 18, height 5\' 8"  (1.727 m), weight (!) 153.2 kg, SpO2 93 %.  PHYSICAL EXAMINATION:  GENERAL:  63 y.o.-year-old obese patient lying in the bed with no acute distress.  EYES: Pupils equal, round, reactive to light and accommodation. No scleral icterus. Extraocular muscles intact.  HEENT: Head atraumatic, normocephalic. Oropharynx and nasopharynx clear.  NECK:  Supple, no jugular venous distention. No thyroid enlargement, no tenderness.  LUNGS: Normal breath sounds bilaterally, no wheezing, have some crepitation. No use of accessory muscles of respiration.   CARDIOVASCULAR: S1, S2 normal. No murmurs, rubs, or gallops.  ABDOMEN: Soft, nontender, nondistended. Bowel sounds present. No organomegaly or mass.  EXTREMITIES: No pedal edema, cyanosis, or clubbing.  Disuse atrophy and deformities present in both lower extremities and both hands have significant deformity due to arthritis. NEUROLOGIC: Cranial nerves II through XII are intact. Muscle strength 3/5 in both upper  extremities 0/5 in both lower extremities. Sensation intact. Gait not checked.  PSYCHIATRIC: The patient is alert and oriented x 0-not talking much.  SKIN: No obvious rash, lesion, or ulcer.   Physical Exam LABORATORY PANEL:   CBC Recent Labs  Lab 07/18/19 0651  WBC 6.9  HGB 7.6*  HCT 25.2*  PLT 53*   ------------------------------------------------------------------------------------------------------------------  Chemistries  Recent Labs  Lab 07/17/19 0338  NA 145  K 4.2  CL 102  CO2 37*  GLUCOSE 70  BUN 39*  CREATININE 1.31*  CALCIUM 8.5*   ------------------------------------------------------------------------------------------------------------------  Cardiac Enzymes No results for input(s): TROPONINI in the last 168 hours. ------------------------------------------------------------------------------------------------------------------  RADIOLOGY:  Ct Chest Wo Contrast  Result Date: 07/16/2019 CLINICAL DATA:  63 year old nursing home patient presenting with acute onset of shortness of breath, who has diminished breath sounds at the LEFT base and wheezing on auscultation. Hypoxemia with oxygen saturation of 81% on room air. EXAM: CT CHEST WITHOUT CONTRAST TECHNIQUE: Multidetector CT imaging of the chest was performed following the standard protocol without IV contrast. COMPARISON:  None. FINDINGS: Beam hardening streak artifact is present as the patient was unable to raise the arms and respiratory motion blurs many of the images. Cardiovascular: Heart mildly to moderately enlarged. Severe three-vessel coronary atherosclerosis. No significant pericardial effusion. Moderate atherosclerosis involving the thoracic and proximal abdominal aorta without evidence of aneurysm. Ectasia at the origin of the innominate artery. Mediastinum/Nodes: Scattered normal sized lymph nodes throughout the mediastinum, none of which are pathologically enlarged. No mediastinal masses. Normal  appearing esophagus. Normal-appearing thyroid gland. Lungs/Pleura: Numerous areas of ground-glass and  confluent airspace consolidation in both lungs involving all lobes. Peripheral blebs involving both lungs. Narrowing of the RIGHT and LEFT mainstem bronchi with focal wall thickening. No pleural effusions. Upper Abdomen: Allowing for the beam hardening artifact, no acute findings in the visualized upper abdomen. Musculoskeletal: Degenerative changes involving the sternoclavicular joints and mild degenerative changes involving the thoracic spine. No acute findings. IMPRESSION: 1. Multilobar BILATERAL pneumonia with ground-glass and confluent airspace consolidation. 2. Narrowing of the RIGHT and LEFT mainstem bronchi associated with focal wall thickening, query severe bronchial inflammation. 3. Mild-to-moderate cardiomegaly. Severe three-vessel coronary atherosclerosis. Aortic Atherosclerosis (ICD10-I70.0) and Emphysema (ICD10-J43.9). Electronically Signed   By: Evangeline Dakin M.D.   On: 07/16/2019 20:38    ASSESSMENT AND PLAN:   Patient is a 63 year old nursing home resident brought in with shortness of breath and hypoxia  1.  Shortness of breath Community-acquired pneumonia  Confirmed by CT scan of the chest without contrast On IV antibiotics. I will treat him with nebulizers and steroid therapy due to bronchospasm he does have history of COPD we will treat as a COPD flare  2.  Acute on chronic anemia we will follow hemoglobin , as he had drop may need GI evaluation -though he is not a good candidate for procedure due to pneumonia currently.  3.  Chronic thrombocytopenia-unclear etiology, called hematology consult.  Advised, as chronic issues- monitor for now and may do work up once stable/ improve.  4.  Hypothyroidism- cont levothyroxine, check TSH.  5.  CHF - ch diastolic- does not appear overtly fluid overloaded .  6.  Thrombocytopenia which is chronic in nature   7.   Miscellaneous SCDs for DVT prophylaxis due to severe thrombocytopenia   8.  Bedbound status at baseline-due to severe arthritis, morbid obesity, schizophrenia-patient is bedbound but can feed himself.  He is a nursing home resident for last 7 years as per his niece.  9. Use of chronic steroids at home- started back.   I could not see in notes by PMD- the reason for steroids.  10. RA- cont HCQ  All the records are reviewed and case discussed with Care Management/Social Workerr. Management plans discussed with the patient, family and they are in agreement.  CODE STATUS: Full  TOTAL TIME TAKING CARE OF THIS PATIENT: *35 minutes.    POSSIBLE D/C IN 1-2 DAYS, DEPENDING ON CLINICAL CONDITION.   Vaughan Basta M.D on 07/18/2019   Between 7am to 6pm - Pager - [phone]  After 6pm go to [email or web address] - password [email or web address] [facility]  Office  [phone]  CC: Primary care physician; [facility]  Note: This dictation was prepared with Dragon dictation along with smaller phrase technology. Any transcriptional errors that result from this process are unintentional.

## 2019-07-18 NOTE — Progress Notes (Signed)
Attempted to place chest vest on patient with RN [employer] assistance. Chest vest that is available is to small for patient. Attempted with large vest. Will research to see if we have larger vest available.

## 2019-07-18 NOTE — Progress Notes (Signed)
PHARMACIST - PHYSICIAN COMMUNICATION  CONCERNING: Antibiotic IV to Oral Route Change Policy  RECOMMENDATION: This patient is receiving azithromycin by the intravenous route.  Based on criteria approved by the Pharmacy and Therapeutics Committee, the antibiotic(s) is/are being converted to the equivalent oral dose form(s).   DESCRIPTION: These criteria include:  Patient being treated for a respiratory tract infection, urinary tract infection, cellulitis or clostridium difficile associated diarrhea if on metronidazole  The patient is not neutropenic and does not exhibit a GI malabsorption state  The patient is eating (either orally or via tube) and/or has been taking other orally administered medications for a least 24 hours  The patient is improving clinically and has a Tmax < 100.5  If you have questions about this conversion, please contact the Pharmacy Department   Thank you, [place] resident

## 2019-07-18 NOTE — Progress Notes (Signed)
SLP Cancellation Note  Patient Details Name: Jerry [name] MRN: [id number] DOB: Apr 20, 1956   Cancelled treatment:       Reason Eval/Treat Not Completed: Fatigue/lethargy limiting ability to participate(chart reviewed; consulted NSG re: pt's status today).  Pt is currently tolerating the recommended dysphagia diet w/ no overt s/s of aspiration noted by NSG when being fed. He is requiring full feeding support and cues during meals; he continues to present oral phase deficits. Min improvement in his respiratory status overall from yesterday per NSG. Pt has baseline Cognitive Impairment per chart notes.  Recommend continue w/ current diet as ordered for safety w/ oral intake as pt's medical status continues to improve. ST services will f/u next 1-3 days w/ trials to upgrade diet as appropriate. Will closely monitor respiratory status and toleration of diet for any s/s of aspiration.      Orinda Kenner, MS, CCC-SLP Jerry [name] 07/18/2019, 1:34 PM

## 2019-07-18 NOTE — Consult Note (Signed)
Jerry [name] , MD [address], [place], [place], [place], [address] [address] [place], [place], [place], [address] Phone: [phone]  Fax: [phone]  Consultation  Referring Provider:    Dr Stark Jock  Primary Care Physician:  Orvis Brill, [facility] Primary Gastroenterologist:  None     Reason for Consultation:     GI bleed  Date of Admission:  07/16/2019 Date of Consultation:  07/18/2019         HPI:   Jerry [name] is a 63 y.o. male was admitted on 07/16/2019 with shortness of breath.  He he has a past medical history of congestive heart failure, COPD, hypertension, morbid obesity, peptic ulcer disease in the past.  He was sent from a nursing home.  Stool was documented to be brown in the ER.  The reason we are called is because a stool occult test is positive.  His baseline hemoglobin is around 9.6 g checked 1 year back.  On admission was 8.4 g and has been stable for the past 2 days at 7.6 g.  He also has thrombocytopenia and an MCV of 88.4.  He has seen by Dr. Rogue Bussing for the thrombocytopenia.  Platelet count has been low for over a year.  He felt that it might be secondary to liver cirrhosis.  Patient is bedbound and lives at [facility] for the past 7 years.  Patient is nonverbal.  CT scan of the chest showed multilobar bilateral pneumonia with groundglass appearance and airspace consolidation.  Narrowing of the right and left mainstem bronchi associated focal wall thickening consideration for severe bronchial inflammation.  Mild to moderate cardiomegaly.  Past Medical History:  Diagnosis Date   BPH (benign prostatic hyperplasia)    CHF (congestive heart failure) (HCC)    Chronic pain    COPD (chronic obstructive pulmonary disease) (HCC)    Hypertension    Morbid obesity (HCC)    Pressure ulcer    Rheumatoid arthritis ([place])    Schizophrenia ([place])    Thyroid disease     History reviewed. No pertinent surgical history.  Prior to Admission medications    Medication Sig Start Date End Date Taking? Authorizing Provider  ammonium lactate (LAC-HYDRIN) 12 % lotion Apply 1 application topically 2 (two) times daily. Apply topically to bilateral feet   Yes [provider]  benztropine (COGENTIN) 0.5 MG tablet Take 0.5 mg by mouth 2 (two) times daily.   Yes [provider]  clonazePAM (KLONOPIN) 1 MG tablet Take 1 tablet (1 mg total) by mouth at bedtime. 07/02/17  Yes Max Sane, MD  docusate sodium (COLACE) 100 MG capsule Take 100 mg by mouth 2 (two) times daily.   Yes [provider]  fluPHENAZine (PROLIXIN) 2.5 MG tablet Take 5 tablets (12.5 mg total) by mouth daily. Take 12.5 mg by mouth in the morning and 15 mg by mouth at bedtime. Patient taking differently: Take 12.5-15 mg by mouth 2 (two) times daily. Take two and one and one-half tablets (12.5 mg) in the morning and three tablets (15 mg) in the evening 07/02/17  Yes Max Sane, MD  hydrochlorothiazide (HYDRODIURIL) 12.5 MG tablet Take 12.5 mg by mouth daily.   Yes [provider]  hydrocortisone (CORTEF) 10 MG tablet Take 1 tablet (10 mg total) by mouth 2 (two) times daily. 12/30/17  Yes Fritzi Mandes, MD  hydroxychloroquine (PLAQUENIL) 200 MG tablet Take 200 mg by mouth 2 (two) times daily.   Yes [provider]  levothyroxine (SYNTHROID) 200  MCG tablet Take 200 mcg by mouth daily before breakfast. Take along with 25 mcg tablet for total of 225 mcg daily   Yes [provider]  levothyroxine (SYNTHROID) 25 MCG tablet Take 25 mcg by mouth daily before breakfast. Take along with 200 mcg tablet for total of 225 mcg daily   Yes [provider]  methocarbamol (ROBAXIN) 750 MG tablet Take 750 mg by mouth at bedtime.   Yes [provider]  metoprolol tartrate (LOPRESSOR) 25 MG tablet Take 25 mg by mouth 2 (two) times daily.   Yes [provider]  Multiple Vitamins-Minerals (MULTIVITAMIN WITH MINERALS) tablet Take 1 tablet by mouth  daily.   Yes [provider]  Polyethylene Glycol 3350 (MIRALAX PO) Take 17 g by mouth daily.   Yes [provider]  potassium chloride (MICRO-K) 10 MEQ CR capsule Take 10 mEq by mouth daily.   Yes [provider]  tamsulosin (FLOMAX) 0.4 MG CAPS capsule Take 0.4 mg by mouth daily.   Yes [provider]  torsemide (DEMADEX) 10 MG tablet Take 10 mg by mouth daily.   Yes [provider]  traZODone (DESYREL) 100 MG tablet Take 100-200 mg by mouth at bedtime.   Yes [provider]  vitamin C (ASCORBIC ACID) 250 MG tablet Take 500 mg by mouth 2 (two) times daily.   Yes [provider]  albuterol (PROVENTIL HFA;VENTOLIN HFA) 108 (90 Base) MCG/ACT inhaler Inhale 1 puff into the lungs every 3 (three) hours as needed for wheezing or shortness of breath.    [provider]  protein supplement shake (PREMIER PROTEIN) LIQD Take 325 mLs (11 oz total) by mouth 3 (three) times daily between meals. 08/13/17   Epifanio Lesches, MD    History reviewed. No pertinent family history.   Social History   Tobacco Use   Smoking status: Never Smoker   Smokeless tobacco: Never Used  Substance Use Topics   Alcohol use: No   Drug use: No    Allergies as of 07/16/2019 - Review Complete 07/16/2019  Allergen Reaction Noted   Methadone  06/21/2017   Penicillins  06/21/2017   Propoxyphene  06/21/2017   Sulfa antibiotics  06/21/2017   Valproic acid and related  08/06/2017    Review of Systems:    All systems reviewed and negative except where noted in HPI.   Physical Exam:  Vital signs in last 24 hours: Temp:  [96.2 F (35.7 C)-97.7 F (36.5 C)] 97.4 F (36.3 C) (08/21 0935) Pulse Rate:  [69-88] 87 (08/21 0935) Resp:  [16-20] 18 (08/21 0935) BP: (80-130)/(56-98) 80/66 (08/21 0941) SpO2:  [93 %-99 %] 94 % (08/21 0935)   General:   Pleasant, cooperative in NAD Head:  Normocephalic and atraumatic. Eyes:   No icterus.    Conjunctiva pink. PERRLA. Ears:  Normal auditory acuity. Neck:  Supple; no masses or thyroidomegaly Lungs: Respirations even and unlabored. Lungs clear to auscultation bilaterally.   No wheezes, crackles, or rhonchi.  Heart:  Regular rate and rhythm;  Without murmur, clicks, rubs or gallops Abdomen:  Soft, nondistended, nontender. Normal bowel sounds. No appreciable masses or hepatomegaly.  No rebound or guarding.  Neurologic:  Alert and oriented x3;  grossly normal neurologically. Skin:  Intact without significant lesions or rashes. Cervical Nodes:  No significant cervical adenopathy. Psych:  Alert and cooperative. Normal affect.  LAB RESULTS: Recent Labs    07/16/19 1350 07/17/19 0338 07/18/19 0651  WBC 4.2 4.5 6.9  HGB 8.4* 7.6*  7.6*  HCT 28.0* 25.8* 25.2*  PLT 39* 40* 53*   BMET Recent Labs    07/16/19 1350 07/17/19 0338  NA 141 145  K 4.1 4.2  CL 95* 102  CO2 37* 37*  GLUCOSE 92 70  BUN 41* 39*  CREATININE 1.61* 1.31*  CALCIUM 8.8* 8.5*   LFT No results for input(s): PROT, ALBUMIN, AST, ALT, ALKPHOS, BILITOT, BILIDIR, IBILI in the last 72 hours. PT/INR No results for input(s): LABPROT, INR in the last 72 hours.  STUDIES: Ct Chest Wo Contrast  Result Date: 07/16/2019 CLINICAL DATA:  63 year old nursing home patient presenting with acute onset of shortness of breath, who has diminished breath sounds at the LEFT base and wheezing on auscultation. Hypoxemia with oxygen saturation of 81% on room air. EXAM: CT CHEST WITHOUT CONTRAST TECHNIQUE: Multidetector CT imaging of the chest was performed following the standard protocol without IV contrast. COMPARISON:  None. FINDINGS: Beam hardening streak artifact is present as the patient was unable to raise the arms and respiratory motion blurs many of the images. Cardiovascular: Heart mildly to moderately enlarged. Severe three-vessel coronary atherosclerosis. No significant pericardial effusion. Moderate atherosclerosis  involving the thoracic and proximal abdominal aorta without evidence of aneurysm. Ectasia at the origin of the innominate artery. Mediastinum/Nodes: Scattered normal sized lymph nodes throughout the mediastinum, none of which are pathologically enlarged. No mediastinal masses. Normal appearing esophagus. Normal-appearing thyroid gland. Lungs/Pleura: Numerous areas of ground-glass and confluent airspace consolidation in both lungs involving all lobes. Peripheral blebs involving both lungs. Narrowing of the RIGHT and LEFT mainstem bronchi with focal wall thickening. No pleural effusions. Upper Abdomen: Allowing for the beam hardening artifact, no acute findings in the visualized upper abdomen. Musculoskeletal: Degenerative changes involving the sternoclavicular joints and mild degenerative changes involving the thoracic spine. No acute findings. IMPRESSION: 1. Multilobar BILATERAL pneumonia with ground-glass and confluent airspace consolidation. 2. Narrowing of the RIGHT and LEFT mainstem bronchi associated with focal wall thickening, query severe bronchial inflammation. 3. Mild-to-moderate cardiomegaly. Severe three-vessel coronary atherosclerosis. Aortic Atherosclerosis (ICD10-I70.0) and Emphysema (ICD10-J43.9). Electronically Signed   By: Evangeline Dakin M.D.   On: 07/16/2019 20:38   Dg Chest Portable 1 View  Result Date: 07/16/2019 CLINICAL DATA:  Shortness of breath. EXAM: PORTABLE CHEST 1 VIEW COMPARISON:  Radiographs of December 27, 2017. FINDINGS: Stable cardiomegaly. Mild central pulmonary vascular congestion is noted. No pneumothorax is noted. Bilateral perihilar and basilar densities are noted which may represent scarring or fibrosis, but acute superimposed edema or inflammation cannot be excluded. No significant pleural effusion is noted. Bony thorax is unremarkable. IMPRESSION: Bilateral perihilar and basilar densities are noted which may represent scarring or fibrosis, but acute superimposed edema or  inflammation cannot be excluded. Electronically Signed   By: Marijo Conception M.D.   On: 07/16/2019 14:30      Impression / Plan:   Hewey Redington is a 63 y.o. y/o male with a history of schizophrenia, COPD, CHF presented to hospital with shortness of breath and CT scan of the chest appears to show multilobar pneumonia with severe bronchial wall thickening and associated narrowing.  Looking back at his labs he seems to have thrombocytopenia for over a year.  And a baseline anemia of unknown etiology.  On admission his hemoglobin was lower than baseline.  It has been documented that he has been having brown stools.  For unknown reason guaiac test was checked which is positive.  Stool guaiac test is not a test to rule in or rule  out a GI bleed.  It is a test for colon cancer screening.  Irrespective of the stool test, bleeding should be determined if hemoptysis or melena is noted or bright red blood per rectum.  In the absence of this would not tolerate an acute GI bleed and in the present scenario of severe pneumonia the risks of any endoscopy outweigh the benefits.  There is some concern whether the thrombocytopenia may be related to his liver cirrhosis.  Imaging of the abdomen with either an ultrasound or CT scan should can look for features of chronic liver disease which would be demonstrated by splenomegaly and features of portal hypertension.  In the absence of active GI bleeding I would not proceed with any form of endoscopy at this point of time.  Ideally would like to wait till his pneumonia has resolved and if stable probably perform endoscopy a few weeks after stable.  Thank you for involving me in the care of this patient.      LOS: 2 days   Jerry Bellows, MD  07/18/2019, 9:47 AM

## 2019-07-19 LAB — BASIC METABOLIC PANEL
Anion gap: 10 (ref 5–15)
BUN: 42 mg/dL — ABNORMAL HIGH (ref 8–23)
CO2: 32 mmol/L (ref 22–32)
Calcium: 8.7 mg/dL — ABNORMAL LOW (ref 8.9–10.3)
Chloride: 108 mmol/L (ref 98–111)
Creatinine, Ser: 1.2 mg/dL (ref 0.61–1.24)
GFR calc Af Amer: >60 mL/min (ref >60)
GFR calc non Af Amer: >60 mL/min (ref >60)
Glucose, Bld: 85 mg/dL (ref 70–99)
Potassium: 3.9 mmol/L (ref 3.5–5.1)
Sodium: 150 mmol/L — ABNORMAL HIGH (ref 135–145)

## 2019-07-19 LAB — CBC
HCT: 25 % — ABNORMAL LOW (ref 39.0–52.0)
Hemoglobin: 7.5 g/dL — ABNORMAL LOW (ref 13.0–17.0)
MCH: 26.6 pg (ref 26.0–34.0)
MCHC: 30 g/dL (ref 30.0–36.0)
MCV: 88.7 fL (ref 80.0–100.0)
Platelets: 60 10*3/uL — ABNORMAL LOW (ref 150–400)
RBC: 2.82 MIL/uL — ABNORMAL LOW (ref 4.22–5.81)
RDW: 22 % — ABNORMAL HIGH (ref 11.5–15.5)
WBC: 4.6 10*3/uL (ref 4.0–10.5)
nRBC: 2.4 % — ABNORMAL HIGH (ref 0.0–0.2)

## 2019-07-19 MED ORDER — DEXTROSE 5 % IV SOLN
INTRAVENOUS | Status: DC
  Administered 2019-07-19 – 2019-07-20 (×2): via INTRAVENOUS

## 2019-07-19 MED ORDER — ALBUTEROL SULFATE (2.5 MG/3ML) 0.083% IN NEBU
2.5000 mg | INHALATION_SOLUTION | Freq: Four times a day (QID) | RESPIRATORY_TRACT | Status: DC | PRN
  Administered 2019-07-21: 01:00:00 2.5 mg via RESPIRATORY_TRACT
  Filled 2019-07-19: qty 3

## 2019-07-19 MED ORDER — MOMETASONE FURO-FORMOTEROL FUM 200-5 MCG/ACT IN AERO
2.0000 | INHALATION_SPRAY | Freq: Two times a day (BID) | RESPIRATORY_TRACT | Status: DC
  Administered 2019-07-20 – 2019-07-28 (×13): 2 via RESPIRATORY_TRACT
  Filled 2019-07-19: qty 8.8

## 2019-07-19 NOTE — Progress Notes (Addendum)
  Speech Language Pathology Treatment: Dysphagia  Patient Details Name: Jerry [name] MRN: [id number] DOB: 01-May-1956 Today's Date: 07/19/2019 Time: [id number] SLP Time Calculation (min) (ACUTE ONLY): 41 min  Assessment / Plan / Recommendation Clinical Impression  Pt seen for ongoing assessment of toleration of dysphagia diet, Dysphagia level 1 (puree) w/ Nectar consistency liquids. Pt requires full positioning and feeding support for oral intake. He is more awake today per MD noted; speaking a few words to staff and MD but not reliable answers to questions. Normal breath sounds bilaterally, no wheezing per MD note today. Pt appeared to consume po trials of purees and nectar consistency liquids via straw/cup provided aspiration precautions and feeding support - No overt s/s of aspiration noted. However, Mild+ oral phase deficits noted c/b most by disorganized bolus management and A-P transfer at times. He appeared to have min oral holding in "preparation for swallowing" - unsure if distracted. Pt often bit hard on the straw during sucking; visual/verbal cues given to swallow and clear for the next bolus to be fed to him.  Pt continues to appear to present w/ oropharyngeal phase dysphagia impacted by Cognitive/Mental status thus w/ increased risk for aspiration, choking. Recommend continue w/ current diet consistency as ordered, aspiration precautions, and pills in Puree. ST services will continue w/ trials to upgrade diet and possible MBSS to best address pharyngeal swallow questions, aspiration. ST services will f/u on Monday; precautions posted in room.     HPI HPI: Pt is a 63 y.o. male with a known history of BPH, congestive heart failure, COPD, hypertension, morbid obesity, history of peptic ulcer disease, rheumatoid arthritis, Schizophrenia, and hypothyroidism who is sent from a nursing home due to patient having shortness of breath and hypoxia.  He had to be placed on oxygen in the ED.  I am told by  the ED physician patient has pneumonia however his chest x-ray is negative his covid with test is negative.  Patient unable to provide me with any review of systems.  Unclear what his baseline is.      SLP Plan  Continue with current plan of care       Recommendations  Diet recommendations: Dysphagia 1 (puree);Nectar-thick liquid Liquids provided via: Cup;Straw Medication Administration: Crushed with puree(for safer swallowing) Supervision: Staff to assist with self feeding;Full supervision/cueing for compensatory strategies Compensations: Minimize environmental distractions;Slow rate;Small sips/bites;Lingual sweep for clearance of pocketing;Multiple dry swallows after each bite/sip;Follow solids with liquid Postural Changes and/or Swallow Maneuvers: Seated upright 90 degrees;Upright 30-60 min after meal                General recommendations: (Dietician f/u) Oral Care Recommendations: Oral care BID;Staff/trained caregiver to provide oral care Follow up Recommendations: Skilled Nursing facility SLP Visit Diagnosis: Dysphagia, oropharyngeal phase (R13.12)(Declined Cognitive status baseline) Plan: Continue with current plan of care       Olivarez, [place], CCC-SLP Jerry [name] 07/19/2019, 1:59 PM

## 2019-07-19 NOTE — Progress Notes (Signed)
[place] [facility] [place] NAME: Jerry [name]    MR#:  [id number]  DATE OF BIRTH:  October 22, 1956  SUBJECTIVE:  CHIEF COMPLAINT:   Chief Complaint  Patient presents with  . Shortness of Breath   Sent from [facility]-where he lives for last 7 years and is bedbound at his baseline due to significant and severe arthritis and schizophrenia. He was sent with shortness of breath and altered mental status.     He is more awake and he talks in few words today but cannot give reliable answers.  REVIEW OF SYSTEMS:  Due to altered mental status patient cannot give review of system. ROS  DRUG ALLERGIES:   Allergies  Allergen Reactions  . Methadone   . Penicillins     Tolerates cephalosporins   . Propoxyphene   . Sulfa Antibiotics   . Valproic Acid And Related     Thrombocytopenia    VITALS:  Blood pressure 110/85, pulse 98, temperature 97.8 F (36.6 C), temperature source Axillary, resp. rate 18, height 5\' 8"  (1.727 m), weight (!) 153.2 kg, SpO2 99 %.  PHYSICAL EXAMINATION:  GENERAL:  63 y.o.-year-old obese patient lying in the bed with no acute distress.  EYES: Pupils equal, round, reactive to light and accommodation. No scleral icterus. Extraocular muscles intact.  HEENT: Head atraumatic, normocephalic. Oropharynx and nasopharynx clear.  NECK:  Supple, no jugular venous distention. No thyroid enlargement, no tenderness.  LUNGS: Normal breath sounds bilaterally, no wheezing, have some crepitation. No use of accessory muscles of respiration.   CARDIOVASCULAR: S1, S2 normal. No murmurs, rubs, or gallops.  ABDOMEN: Soft, nontender, nondistended. Bowel sounds present. No organomegaly or mass.  EXTREMITIES: No pedal edema, cyanosis, or clubbing.  Disuse atrophy and deformities present in both lower extremities and both hands have significant deformity due to arthritis. NEUROLOGIC: Cranial nerves II through XII are intact. Muscle strength 3/5 in both  upper extremities 0/5 in both lower extremities. Sensation intact. Gait not checked.  PSYCHIATRIC: The patient is alert and oriented x 1, talking few words which is  new today.  SKIN: No obvious rash, lesion, or ulcer.   Physical Exam LABORATORY PANEL:   CBC Recent Labs  Lab 07/19/19 0348  WBC 4.6  HGB 7.5*  HCT 25.0*  PLT 60*   ------------------------------------------------------------------------------------------------------------------  Chemistries  Recent Labs  Lab 07/19/19 0348  NA 150*  K 3.9  CL 108  CO2 32  GLUCOSE 85  BUN 42*  CREATININE 1.20  CALCIUM 8.7*   ------------------------------------------------------------------------------------------------------------------  Cardiac Enzymes No results for input(s): TROPONINI in the last 168 hours. ------------------------------------------------------------------------------------------------------------------  RADIOLOGY:  No results found.  ASSESSMENT AND PLAN:   Patient is a 63 year old nursing home resident brought in with shortness of breath and hypoxia  1.  Shortness of breath Community-acquired pneumonia  Confirmed by CT scan of the chest without contrast On IV antibiotics.  treat him with nebulizers and steroid therapy due to bronchospasm he does have history of COPD we will treat as a COPD flare  2.  Acute on chronic anemia we will follow hemoglobin , as he had drop may need GI evaluation -though he is not a good candidate for procedure due to pneumonia currently.  3.  Chronic thrombocytopenia-unclear etiology, called hematology consult.  Advised, as chronic issues- monitor for now and may do work up once stable/ improve.  4.  Hypothyroidism- cont levothyroxine, check TSH.  5.  CHF - ch diastolic- does not appear overtly fluid  overloaded .  6.  Thrombocytopenia which is chronic in nature   7.  Miscellaneous SCDs for DVT prophylaxis due to severe thrombocytopenia  8.  Bedbound  status at baseline-due to severe arthritis, morbid obesity, schizophrenia-patient is bedbound but can feed himself.  He is a nursing home resident for last 7 years as per his niece.  9. Use of chronic steroids at home- started back.   I could not see in notes by PMD- the reason for steroids.  10. RA- cont HCQ  11. Hypernatremia- started on D5 W.  As he is a nursing home resident and now has some improvement, I anticipate he would be ready to go in 1 to 2 days so I have ordered COVID screening test. Also spoke to his niece on the phone today regarding getting a meeting with palliative care and she is open for that.  I will call palliative care consult on Monday.  All the records are reviewed and case discussed with Care Management/Social Workerr. Management plans discussed with the patient, family and they are in agreement.  CODE STATUS: Full  TOTAL TIME TAKING CARE OF THIS PATIENT: 35 minutes.    POSSIBLE D/C IN 1-2 DAYS, DEPENDING ON CLINICAL CONDITION.   Vaughan Basta M.D on 07/19/2019   Between 7am to 6pm - Pager - [phone]  After 6pm go to [email or web address] - password [email or web address] [facility]  Office  [phone]  CC: Primary care physician; [facility]  Note: This dictation was prepared with Dragon dictation along with smaller phrase technology. Any transcriptional errors that result from this process are unintentional.

## 2019-07-20 LAB — BASIC METABOLIC PANEL
Anion gap: 10 (ref 5–15)
BUN: 29 mg/dL — ABNORMAL HIGH (ref 8–23)
CO2: 29 mmol/L (ref 22–32)
Calcium: 8.9 mg/dL (ref 8.9–10.3)
Chloride: 108 mmol/L (ref 98–111)
Creatinine, Ser: 0.93 mg/dL (ref 0.61–1.24)
GFR calc Af Amer: >60 mL/min (ref >60)
GFR calc non Af Amer: >60 mL/min (ref >60)
Glucose, Bld: 70 mg/dL (ref 70–99)
Potassium: 3.5 mmol/L (ref 3.5–5.1)
Sodium: 147 mmol/L — ABNORMAL HIGH (ref 135–145)

## 2019-07-20 LAB — NOVEL CORONAVIRUS, NAA (HOSP ORDER, SEND-OUT TO REF LAB; TAT 18-24 HRS)

## 2019-07-20 NOTE — Progress Notes (Signed)
Jerry [name]   DOB:February 22, 1956   TC#:[id number]    Subjective: Patient is able to tell his name today.  Otherwise mostly nonverbal/question baseline given his underlying schizophrenia.  Does not give any reliable answers.  Objective:  Vitals:   07/20/19 0554 07/20/19 0956  BP: 131/79 124/80  Pulse: (!) 105 (!) 104  Resp: 18 18  Temp: 97.6 F (36.4 C) 98 F (36.7 C)  SpO2: 96% 97%     Intake/Output Summary (Last 24 hours) at 07/20/2019 1318 Last data filed at 07/20/2019 1239 Gross per 24 hour  Intake 2117.19 ml  Output 2300 ml  Net -182.81 ml    Physical Exam  Constitutional: He is well-developed, well-nourished, and in no distress.  Morbidly obese male patient.  Resting in bed comfortably.  No acute distress.  HENT:  Head: Normocephalic and atraumatic.  Mouth/Throat: Oropharynx is clear and moist. No oropharyngeal exudate.  Eyes: Pupils are equal, round, and reactive to light.  Neck: Normal range of motion. Neck supple.  Cardiovascular: Normal rate and regular rhythm.  Pulmonary/Chest: No respiratory distress. He has no wheezes.  Abdominal: Soft. Bowel sounds are normal. He exhibits no distension and no mass. There is no abdominal tenderness. There is no rebound and no guarding.  Musculoskeletal: Normal range of motion.        General: No tenderness or edema.  Neurological: He is alert.  Oriented x1.  Skin: Skin is warm.  Psychiatric:  Flat affect.     Labs:  Lab Results  Component Value Date   WBC 4.6 07/19/2019   HGB 7.5 (L) 07/19/2019   HCT 25.0 (L) 07/19/2019   MCV 88.7 07/19/2019   PLT 60 (L) 07/19/2019   NEUTROABS 2.9 07/16/2019    Lab Results  Component Value Date   NA 147 (H) 07/20/2019   K 3.5 07/20/2019   CL 108 07/20/2019   CO2 29 07/20/2019    Studies:  No results found.  Thrombocytopenia [facility]) #63 year old male patient with multiple medical problems including morbid obesity schizophrenia/rheumatoid arthritis COPD CHF is current admitted the  hospital for worsening shortness of breath/pneumonia-noted to have thrombocytopenia/anemia  #Thrombocytopenia-chronic at least for the last 2 years to baseline around 40 50,000.  Patient's platelets are 50s to 60s.  The etiology is unclear-suspect related to liver disease/cirrhosis splenomegaly rather than any aggressive medical causes.  I would recommend a CT scan of abdomen pelvis for further evaluation.   #Anemia-chronic baseline 9-10; normocytic.  Currently 7.5.  Again unclear etiology liver disease/hypersplenism/rheumatoid arthritis.  No obvious evidence of iron deficiency.  Status post GI evaluation; recommend work-up once acute issues resolve.  Bone marrow biopsy could be considered if family interested in pursuing aggressive interventions.  In general patient poor candidate for aggressive interventions given his poor baseline functional status.  #Schizophrenia/nonverbal-unclear baseline.   #Discussed with Dr. Anselm Jungling.     Cammie Sickle, MD 07/20/2019  1:18 PM

## 2019-07-20 NOTE — Plan of Care (Signed)
Pt refuses today most of PO meds.  At 1625 notified by [place] that pt's HR in the 140's for few minutes. This Probation officer assessed pt, BP 145/87, HR 118. No signs of pain. Pt's bed was wet. Notified Dr Anselm Jungling of the above. Per MD to d/c tele and to continue to monitor. VS checked and are stable.

## 2019-07-20 NOTE — Progress Notes (Signed)
[place] at [place] NAME: Jerry [name]    MR#:  [id number]  DATE OF BIRTH:  December 26, 1955  SUBJECTIVE:  CHIEF COMPLAINT:   Chief Complaint  Patient presents with  . Shortness of Breath   Sent from [facility]-where he lives for last 7 years and is bedbound at his baseline due to significant and severe arthritis and schizophrenia. He was sent with shortness of breath and altered mental status.     He is more awake and he talks in few words today but cannot give reliable answers.  REVIEW OF SYSTEMS:  Due to altered mental status patient cannot give review of system. ROS  DRUG ALLERGIES:   Allergies  Allergen Reactions  . Methadone   . Penicillins     Tolerates cephalosporins   . Propoxyphene   . Sulfa Antibiotics   . Valproic Acid And Related     Thrombocytopenia    VITALS:  Blood pressure 124/80, pulse (!) 104, temperature 98 F (36.7 C), temperature source Axillary, resp. rate 18, height 5\' 8"  (1.727 m), weight (!) 153.2 kg, SpO2 97 %.  PHYSICAL EXAMINATION:  GENERAL:  63 y.o.-year-old obese patient lying in the bed with no acute distress.  EYES: Pupils equal, round, reactive to light and accommodation. No scleral icterus. Extraocular muscles intact.  HEENT: Head atraumatic, normocephalic. Oropharynx and nasopharynx clear.  NECK:  Supple, no jugular venous distention. No thyroid enlargement, no tenderness.  LUNGS: Normal breath sounds bilaterally, no wheezing, have some crepitation. No use of accessory muscles of respiration.   CARDIOVASCULAR: S1, S2 normal. No murmurs, rubs, or gallops.  ABDOMEN: Soft, nontender, nondistended. Bowel sounds present. No organomegaly or mass.  EXTREMITIES: No pedal edema, cyanosis, or clubbing.  Disuse atrophy and deformities present in both lower extremities and both hands have significant deformity due to arthritis. NEUROLOGIC: Cranial nerves II through XII are intact. Muscle strength 3/5 in  both upper extremities 0/5 in both lower extremities. Sensation intact. Gait not checked.  PSYCHIATRIC: The patient is alert and oriented x 1, talking few words which is  new today.  SKIN: No obvious rash, lesion, or ulcer.   Physical Exam LABORATORY PANEL:   CBC Recent Labs  Lab 07/19/19 0348  WBC 4.6  HGB 7.5*  HCT 25.0*  PLT 60*   ------------------------------------------------------------------------------------------------------------------  Chemistries  Recent Labs  Lab 07/20/19 0525  NA 147*  K 3.5  CL 108  CO2 29  GLUCOSE 70  BUN 29*  CREATININE 0.93  CALCIUM 8.9   ------------------------------------------------------------------------------------------------------------------  Cardiac Enzymes No results for input(s): TROPONINI in the last 168 hours. ------------------------------------------------------------------------------------------------------------------  RADIOLOGY:  No results found.  ASSESSMENT AND PLAN:   Patient is a 63 year old nursing home resident brought in with shortness of breath and hypoxia  1.  Shortness of breath Community-acquired pneumonia  Confirmed by CT scan of the chest without contrast On IV antibiotics.  treat him with nebulizers and steroid therapy due to bronchospasm he does have history of COPD we will treat as a COPD flare  2.  Acute on chronic anemia we will follow hemoglobin , as he had drop may need GI evaluation -though he is not a good candidate for procedure due to pneumonia currently.  3.  Chronic thrombocytopenia-unclear etiology, called hematology consult.  Advised, as chronic issues- monitor for now   Get CT abd , pelvis with contrast.  4.  Hypothyroidism- cont levothyroxine, checked TSH.  5.  CHF - ch diastolic- does not appear  overtly fluid overloaded .  6.  Thrombocytopenia which is chronic in nature   7.  Miscellaneous SCDs for DVT prophylaxis due to severe thrombocytopenia  8.  Bedbound  status at baseline-due to severe arthritis, morbid obesity, schizophrenia-patient is bedbound but can feed himself.  He is a nursing home resident for last 7 years as per his niece.  9. Use of chronic steroids at home- started back.   I could not see in notes by PMD- the reason for steroids.  10. RA- cont HCQ  11. Hypernatremia- started on D5 W. Improved.  As he is a nursing home resident and now has some improvement, I anticipate he would be ready to go in 1 to 2 days so I have ordered COVID screening test. Also spoke to his niece on the phone today regarding getting a meeting with palliative care and she is open for that.  I will call palliative care consult on Monday.  All the records are reviewed and case discussed with Care Management/Social Workerr. Management plans discussed with the patient, family and they are in agreement.  CODE STATUS: Full  TOTAL TIME TAKING CARE OF THIS PATIENT: 35 minutes.    POSSIBLE D/C IN 1-2 DAYS, DEPENDING ON CLINICAL CONDITION.   Vaughan Basta M.D on 07/20/2019   Between 7am to 6pm - Pager - [phone]  After 6pm go to [email or web address] - password [email or web address] [facility]  Office  [phone]  CC: Primary care physician; [facility]  Note: This dictation was prepared with Dragon dictation along with smaller phrase technology. Any transcriptional errors that result from this process are unintentional.

## 2019-07-21 ENCOUNTER — Inpatient Hospital Stay: Payer: Medicaid Other

## 2019-07-21 LAB — CBC
HCT: 26.1 % — ABNORMAL LOW (ref 39.0–52.0)
Hemoglobin: 7.9 g/dL — ABNORMAL LOW (ref 13.0–17.0)
MCH: 26.6 pg (ref 26.0–34.0)
MCHC: 30.3 g/dL (ref 30.0–36.0)
MCV: 87.9 fL (ref 80.0–100.0)
Platelets: 90 10*3/uL — ABNORMAL LOW (ref 150–400)
RBC: 2.97 MIL/uL — ABNORMAL LOW (ref 4.22–5.81)
RDW: 22.6 % — ABNORMAL HIGH (ref 11.5–15.5)
WBC: 11 10*3/uL — ABNORMAL HIGH (ref 4.0–10.5)
nRBC: 0.3 % — ABNORMAL HIGH (ref 0.0–0.2)

## 2019-07-21 LAB — CULTURE, BLOOD (ROUTINE X 2)
Culture: NO GROWTH
Culture: NO GROWTH
Special Requests: ADEQUATE
Special Requests: ADEQUATE

## 2019-07-21 LAB — BASIC METABOLIC PANEL
Anion gap: 12 (ref 5–15)
BUN: 24 mg/dL — ABNORMAL HIGH (ref 8–23)
CO2: 26 mmol/L (ref 22–32)
Calcium: 8.9 mg/dL (ref 8.9–10.3)
Chloride: 109 mmol/L (ref 98–111)
Creatinine, Ser: 1.11 mg/dL (ref 0.61–1.24)
GFR calc Af Amer: >60 mL/min (ref >60)
GFR calc non Af Amer: >60 mL/min (ref >60)
Glucose, Bld: 84 mg/dL (ref 70–99)
Potassium: 3.4 mmol/L — ABNORMAL LOW (ref 3.5–5.1)
Sodium: 147 mmol/L — ABNORMAL HIGH (ref 135–145)

## 2019-07-21 MED ORDER — KETOROLAC TROMETHAMINE 30 MG/ML IJ SOLN
15.0000 mg | Freq: Once | INTRAMUSCULAR | Status: AC
  Administered 2019-07-21: 15 mg via INTRAVENOUS
  Filled 2019-07-21: qty 1

## 2019-07-21 MED ORDER — GLYCOPYRROLATE 0.2 MG/ML IJ SOLN
0.2000 mg | Freq: Four times a day (QID) | INTRAMUSCULAR | Status: DC | PRN
  Administered 2019-07-21 (×2): 0.2 mg via INTRAVENOUS
  Filled 2019-07-21 (×2): qty 1

## 2019-07-21 MED ORDER — SODIUM CHLORIDE 0.9 % IV SOLN
500.0000 mg | INTRAVENOUS | Status: AC
  Administered 2019-07-21 – 2019-07-22 (×2): 500 mg via INTRAVENOUS
  Filled 2019-07-21 (×2): qty 500

## 2019-07-21 MED ORDER — IOHEXOL 240 MG/ML SOLN
25.0000 mL | INTRAMUSCULAR | Status: AC
  Administered 2019-07-21: 25 mL via ORAL

## 2019-07-21 MED ORDER — METOPROLOL TARTRATE 5 MG/5ML IV SOLN
5.0000 mg | Freq: Four times a day (QID) | INTRAVENOUS | Status: DC | PRN
  Administered 2019-07-21 – 2019-07-22 (×3): 5 mg via INTRAVENOUS
  Filled 2019-07-21 (×2): qty 5

## 2019-07-21 MED ORDER — METOPROLOL TARTRATE 25 MG PO TABS
12.5000 mg | ORAL_TABLET | Freq: Two times a day (BID) | ORAL | Status: DC
  Administered 2019-07-21 – 2019-07-23 (×5): 12.5 mg via ORAL
  Filled 2019-07-21 (×6): qty 1

## 2019-07-21 MED ORDER — IOHEXOL 300 MG/ML  SOLN
125.0000 mL | Freq: Once | INTRAMUSCULAR | Status: AC | PRN
  Administered 2019-07-21: 125 mL via INTRAVENOUS

## 2019-07-21 NOTE — Progress Notes (Signed)
[place] at [place] NAME: Jerry [name]    MR#:  [id number]  DATE OF BIRTH:  07/24/1956  SUBJECTIVE:  CHIEF COMPLAINT:   Chief Complaint  Patient presents with  . Shortness of Breath   Sent from [facility]-where he lives for last 7 years and is bedbound at his baseline due to significant and severe arthritis and schizophrenia. He was sent with shortness of breath and altered mental status.     He is more awake and he talks in few words today but cannot give reliable answers. He did not eat today, he spit out medications also and did not tolerate the oral liquid contrast for the CT scan.  REVIEW OF SYSTEMS:  Due to altered mental status patient cannot give review of system. ROS  DRUG ALLERGIES:   Allergies  Allergen Reactions  . Methadone   . Penicillins     Tolerates cephalosporins   . Propoxyphene   . Sulfa Antibiotics   . Valproic Acid And Related     Thrombocytopenia    VITALS:  Blood pressure 131/90, pulse (!) 102, temperature 98.6 F (37 C), resp. rate 20, height 5\' 8"  (1.727 m), weight (!) 153.2 kg, SpO2 100 %.  PHYSICAL EXAMINATION:  GENERAL:  63 y.o.-year-old obese patient lying in the bed with no acute distress.  EYES: Pupils equal, round, reactive to light and accommodation. No scleral icterus. Extraocular muscles intact.  HEENT: Head atraumatic, normocephalic. Oropharynx and nasopharynx clear.  NECK:  Supple, no jugular venous distention. No thyroid enlargement, no tenderness.  LUNGS: Normal breath sounds bilaterally, no wheezing, have some crepitation. No use of accessory muscles of respiration.   CARDIOVASCULAR: S1, S2 normal. No murmurs, rubs, or gallops.  ABDOMEN: Soft, nontender, nondistended. Bowel sounds present. No organomegaly or mass.  EXTREMITIES: No pedal edema, cyanosis, or clubbing.  Disuse atrophy and deformities present in both lower extremities and both hands have significant deformity due to  arthritis. NEUROLOGIC: Cranial nerves II through XII are intact. Muscle strength 3/5 in both upper extremities 0/5 in both lower extremities. Sensation intact. Gait not checked.  PSYCHIATRIC: The patient is alert and oriented x 1, talking few words which is  new today.  SKIN: No obvious rash, lesion, or ulcer.   Physical Exam LABORATORY PANEL:   CBC Recent Labs  Lab 07/21/19 0759  WBC 11.0*  HGB 7.9*  HCT 26.1*  PLT 90*   ------------------------------------------------------------------------------------------------------------------  Chemistries  Recent Labs  Lab 07/21/19 0759  NA 147*  K 3.4*  CL 109  CO2 26  GLUCOSE 84  BUN 24*  CREATININE 1.11  CALCIUM 8.9   ------------------------------------------------------------------------------------------------------------------  Cardiac Enzymes No results for input(s): TROPONINI in the last 168 hours. ------------------------------------------------------------------------------------------------------------------  RADIOLOGY:  Ct Abdomen Pelvis W Contrast  Result Date: 07/21/2019 CLINICAL DATA:  Anemia and thrombocytopenia. EXAM: CT ABDOMEN AND PELVIS WITH CONTRAST TECHNIQUE: Multidetector CT imaging of the abdomen and pelvis was performed using the standard protocol following bolus administration of intravenous contrast. CONTRAST:  179mL OMNIPAQUE IOHEXOL 300 MG/ML  SOLN COMPARISON:  CT scan of the chest 07/16/2019 and abdominal radiographs dated 06/26/2017 FINDINGS: Lower chest: There has been partial clearing of the interstitial infiltrates at the lung bases. The patient has bronchiectasis at both lung bases posteriorly. No effusions. Heart size is within normal limits. Hepatobiliary: Liver parenchyma is normal. Multiple small gallstones. No gallbladder wall thickening. No dilated bile ducts. Pancreas: Unremarkable. No pancreatic ductal dilatation or surrounding inflammatory changes. Spleen: Normal in  size without focal  abnormality. Adrenals/Urinary Tract: 18 mm dense nodule in the superior aspect of the left adrenal gland. Right adrenal gland is normal. Kidneys and ureters are normal. There is slight thickening of the anterior aspect of the bladder wall as well as thickening of the wall of the bladder at the base. Stomach/Bowel: Stomach is within normal limits. Appendix appears normal. No evidence of bowel wall thickening, distention, or inflammatory changes. Vascular/Lymphatic: Aortic atherosclerosis. No enlarged abdominal or pelvic lymph nodes. Reproductive: Prostate is unremarkable. Other: No abdominal wall hernia or abnormality. No abdominopelvic ascites. Musculoskeletal: The patient has marked atrophy gluteal muscles and muscles of the proximal thighs as well as of the left iliopsoas muscle and to a lesser degree of the right psoas muscle. There are severe chronic changes in both hips. 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 Less severe chronic changes of the right hip. These are progressed since the prior abdominal radiograph dated 06/26/2017. IMPRESSION: 1. Slight thickening of the anterior aspect of the bladder wall as well as slight thickening of the wall of the bladder at the base of the bladder. The findings are nonspecific but could represent cystitis or superficial mucosal neoplastic lesions. 2. 18 mm dense nodule in the left adrenal gland. This is indeterminate, atypical for an adenoma. I recommend MRI of the adrenal glands with and without contrast for further characterization. 3. Severe chronic changes in both hips, right greater than left. 4. Cholelithiasis. Aortic Atherosclerosis (ICD10-I70.0). Electronically Signed   By: Lorriane Shire M.D.   On: 07/21/2019 10:45    ASSESSMENT AND PLAN:   Patient is a 63 year old nursing home resident brought in with shortness of breath and hypoxia  1.  Shortness of breath Community-acquired pneumonia , the OPD exacerbation. Confirmed by  CT scan of the chest without contrast On IV antibiotics.  treat him with nebulizers and steroid therapy due to bronchospasm he does have history of COPD we will treat as a COPD flare  2.  Acute on chronic anemia we will follow hemoglobin , as he had drop may need GI evaluation -though he is not a good candidate for procedure due to pneumonia currently.  3.  Chronic thrombocytopenia-unclear etiology, called hematology consult.  Advised, as chronic issues- monitor for now   Get CT abd , pelvis with contrast.  Reported some thickening of the bladder wall and possible malignancy.  I will wait for further input from oncology team.  4.  Hypothyroidism- cont levothyroxine, checked TSH.  5.  CHF - ch diastolic- does not appear overtly fluid overloaded .  6.  Thrombocytopenia which is chronic in nature   7.  Miscellaneous SCDs for DVT prophylaxis due to severe thrombocytopenia  8.  Bedbound status at baseline-due to severe arthritis, morbid obesity, schizophrenia-patient is bedbound but can feed himself.  He is a nursing home resident for last 7 years as per his niece.  9. Use of chronic steroids at home- started back.   I could not see in notes by PMD- the reason for steroids.  10. RA- cont HCQ  11. Hypernatremia- started on D5 W. Improved.  12.  Decreased oral intake, dysphasia Call palliative consult.  As he is a nursing home resident and now has some improvement, I anticipate he would be ready to go in 1 to 2 days so I have ordered COVID screening test.  All the records are reviewed and case discussed with Care Management/Social Workerr. Management plans discussed with the patient, family and they  are in agreement.  CODE STATUS: Full  TOTAL TIME TAKING CARE OF THIS PATIENT: 35 minutes.    POSSIBLE D/C IN 1-2 DAYS, DEPENDING ON CLINICAL CONDITION.   Vaughan Basta M.D on 07/21/2019   Between 7am to 6pm - Pager - [phone]  After 6pm go to [email or web address] -  password [email or web address] [facility]  Office  [phone]  CC: Primary care physician; [facility]  Note: This dictation was prepared with Dragon dictation along with smaller phrase technology. Any transcriptional errors that result from this process are unintentional.

## 2019-07-21 NOTE — TOC Progression Note (Signed)
Transition of Care [facility]) - Progression Note    Patient Details  Name: Jerry [name] MRN: [id number] Date of Birth: 03/28/1956  Transition of Care [facility] CM/SW Contact  Breland Trouten, Lenice Llamas Phone Number: [phone]  07/21/2019, 10:41 AM  Clinical Narrative: Per MD patient may be ready for D/C tomorrow. Covid 19 test is pending. Plan is for patient to return to [facility]. [facility] liaison is aware of above.     Expected Discharge Plan: [place] Barriers to Discharge: Continued Medical Work up  Expected Discharge Plan and Services Expected Discharge Plan: [place] In-house Referral: Clinical Social Work Discharge Planning Services: CM Consult   Living arrangements for the past 2 months: [place] Expected Discharge Date: 07/19/19               DME Arranged: N/A         HH Arranged: NA           Social Determinants of Health (SDOH) Interventions    Readmission Risk Interventions No flowsheet data found.

## 2019-07-21 NOTE — Progress Notes (Signed)
Speech Language Pathology Treatment: Dysphagia  Patient Details Name: Jerry [name] MRN: [id number] DOB: 1956/03/24 Today's Date: 07/21/2019 Time: [id number] SLP Time Calculation (min) (ACUTE ONLY): 55 min  Assessment / Plan / Recommendation Clinical Impression  Pt seen for ongoing assessment of toleration of dysphagia diet, Dysphagia level 1 (puree) w/ Nectar consistency liquids. Pt requires full positioning and feeding support for oral intake. He is awake today speaking in single/few words to [place] staff and SLP w/ slow response time; MD noted not fully reliable answers to questions. No further decline in Pulmonary status per MD notes; pt is on Room Air. Unsure of pt's baseline Cognitive/mental status; verbal responses are slow and follow through inconsistent. He indicated his stomach was "hurting" this session - NSG present and aware. Purpose of this session: to determine if pt is ready for trials to upgrade diet. W/ po trials of puree(pudding, mashed potatoes) and single sips of Nectar tea, he demonstrated oral holding w/ most of the boluses fed to him - increased bolus rolling/swishing orally. Pt was given max verbal/tactile/visual cues to encourage pharyngeal swallowing of the boluses but often he required the option of spiting them out into a cup w/ Nursing assisting. Pt did not seem to know orally what he wanted to do w/ the boluses - he did not transfer A-P to swallow but also it took 1-2 minutes for him to organize how to expectorate them. Nursing staff noted this at breakfast meal as well; pt refused po's/meals yesterday per chart notes. Trial of THIN WATER(thin liquids) was attempted to determine if this was just d/t texture of thickened liquids, and he ended up expectorating the bolus as well. This is a Cognitive presentation in light of the oral phase presentation/deficits. Unsure if related to abdominal discomfort; mental status.    Recommend pt remain on current diet for safe swallowing/intake  at this time in light of uncertainty of overall medical issues. Recommend aspiration precautions; pills in puree; feeding support at meals. Recommend Dietician f/u. MD informed and will f/u w/ further consults. NSG will carefully monitor pt at meals checking for oral clearing b/t bites/sips and/or stop feeding if oral holding noted(in order to reduce risk for choking). ST services will f/u next 1-2 days.      HPI HPI: Pt is a 63 y.o. male with a known history of BPH, congestive heart failure, COPD, hypertension, morbid obesity, history of peptic ulcer disease, rheumatoid arthritis, Schizophrenia, and hypothyroidism who is sent from a nursing home due to patient having shortness of breath and hypoxia.  He had to be placed on oxygen in the ED.  I am told by the ED physician patient has pneumonia; noted Chest CT results.  Patient unable to provide me with any review of systems.  Unclear what his baseline is.      SLP Plan  Continue with current plan of care       Recommendations  Diet recommendations: Dysphagia 1 (puree);Nectar-thick liquid Liquids provided via: Cup;Straw(monitor) Medication Administration: Crushed with puree(as needed for safer swallowing) Supervision: Staff to assist with self feeding;Full supervision/cueing for compensatory strategies Compensations: Minimize environmental distractions;Slow rate;Small sips/bites;Lingual sweep for clearance of pocketing;Multiple dry swallows after each bite/sip;Follow solids with liquid(Check for oral clearing) Postural Changes and/or Swallow Maneuvers: Seated upright 90 degrees;Upright 30-60 min after meal                General recommendations: (Dietician f/u; Palliative Care consult) Oral Care Recommendations: Oral care BID;Staff/trained caregiver to provide oral care Follow up  Recommendations: Skilled Nursing facility SLP Visit Diagnosis: Dysphagia, oropharyngeal phase (R13.12)(declined Cognitive status) Plan: Continue with current  plan of care       [place], MS, CCC-SLP Watson,Katherine 07/21/2019, 1:46 PM

## 2019-07-22 ENCOUNTER — Encounter: Payer: Self-pay | Admitting: Primary Care

## 2019-07-22 DIAGNOSIS — Z7189 Other specified counseling: Secondary | ICD-10-CM

## 2019-07-22 DIAGNOSIS — J189 Pneumonia, unspecified organism: Secondary | ICD-10-CM | POA: Diagnosis present

## 2019-07-22 LAB — NOVEL CORONAVIRUS, NAA (HOSP ORDER, SEND-OUT TO REF LAB; TAT 18-24 HRS): SARS-CoV-2, NAA: NOT DETECTED

## 2019-07-22 MED ORDER — HALOPERIDOL LACTATE 5 MG/ML IJ SOLN
5.0000 mg | Freq: Once | INTRAMUSCULAR | Status: AC
  Administered 2019-07-22: 5 mg via INTRAVENOUS
  Filled 2019-07-22: qty 1

## 2019-07-22 NOTE — Progress Notes (Addendum)
[facility] [place] NAME: Jerry [name]    MR#:  [id number]  DATE OF BIRTH:  04/17/1956  SUBJECTIVE:  CHIEF COMPLAINT:   Chief Complaint  Patient presents with  . Shortness of Breath   Sent from [facility]-where he lives for last 7 years and is bedbound at his baseline due to significant and severe arthritis and schizophrenia.  Admitted for pneumonia.  Also found to have anemia and thrombocytopenia.  Patient is awake but confused.  Unable to get much history.  But overall seems comfortable.  REVIEW OF SYSTEMS:  Due to altered mental status patient cannot get review of system.   DRUG ALLERGIES:   Allergies  Allergen Reactions  . Methadone   . Penicillins     Tolerates cephalosporins   . Propoxyphene   . Sulfa Antibiotics   . Valproic Acid And Related     Thrombocytopenia    VITALS:  Blood pressure 119/71, pulse 87, temperature 99.1 F (37.3 C), temperature source Oral, resp. rate 20, height 5\' 8"  (1.727 m), weight (!) 153.2 kg, SpO2 97 %.  PHYSICAL EXAMINATION:  GENERAL:  63 y.o.-year-old obese patient lying in the bed with no acute distress.  EYES: Pupils equal, round, reactive to light and accommodation. No scleral icterus. Extraocular muscles intact.  HEENT: Head atraumatic, normocephalic. Oropharynx and nasopharynx clear.  NECK:  Supple, no jugular venous distention. No thyroid enlargement, no tenderness.  LUNGS: Decreased breath sound at the bases.  No wheezing. CARDIOVASCULAR: S1, S2 normal. No murmurs, rubs, or gallops.  ABDOMEN: Soft, nontender, nondistended. Bowel sounds present. No organomegaly or mass.  EXTREMITIES: No pedal edema, cyanosis, or clubbing.  Disuse atrophy and deformities present in both lower extremities and both hands have significant deformity due to arthritis. NEUROLOGIC: Cranial nerves II through XII are intact. Muscle strength 3/5 in both upper extremities 0/5 in both lower extremities.  Sensation intact. Gait not checked.  PSYCHIATRIC: The patient is alert and awake.  Confused SKIN: No obvious rash, lesion, or ulcer.   Physical Exam LABORATORY PANEL:   CBC Recent Labs  Lab 07/21/19 0759  WBC 11.0*  HGB 7.9*  HCT 26.1*  PLT 90*   ------------------------------------------------------------------------------------------------------------------  Chemistries  Recent Labs  Lab 07/21/19 0759  NA 147*  K 3.4*  CL 109  CO2 26  GLUCOSE 84  BUN 24*  CREATININE 1.11  CALCIUM 8.9   ------------------------------------------------------------------------------------------------------------------  Cardiac Enzymes No results for input(s): TROPONINI in the last 168 hours. ------------------------------------------------------------------------------------------------------------------  RADIOLOGY:  Ct Abdomen Pelvis W Contrast  Result Date: 07/21/2019 CLINICAL DATA:  Anemia and thrombocytopenia. EXAM: CT ABDOMEN AND PELVIS WITH CONTRAST TECHNIQUE: Multidetector CT imaging of the abdomen and pelvis was performed using the standard protocol following bolus administration of intravenous contrast. CONTRAST:  147mL OMNIPAQUE IOHEXOL 300 MG/ML  SOLN COMPARISON:  CT scan of the chest 07/16/2019 and abdominal radiographs dated 06/26/2017 FINDINGS: Lower chest: There has been partial clearing of the interstitial infiltrates at the lung bases. The patient has bronchiectasis at both lung bases posteriorly. No effusions. Heart size is within normal limits. Hepatobiliary: Liver parenchyma is normal. Multiple small gallstones. No gallbladder wall thickening. No dilated bile ducts. Pancreas: Unremarkable. No pancreatic ductal dilatation or surrounding inflammatory changes. Spleen: Normal in size without focal abnormality. Adrenals/Urinary Tract: 18 mm dense nodule in the superior aspect of the left adrenal gland. Right adrenal gland is normal. Kidneys and ureters are normal. There is slight  thickening of the anterior aspect of the  bladder wall as well as thickening of the wall of the bladder at the base. Stomach/Bowel: Stomach is within normal limits. Appendix appears normal. No evidence of bowel wall thickening, distention, or inflammatory changes. Vascular/Lymphatic: Aortic atherosclerosis. No enlarged abdominal or pelvic lymph nodes. Reproductive: Prostate is unremarkable. Other: No abdominal wall hernia or abnormality. No abdominopelvic ascites. Musculoskeletal: The patient has marked atrophy gluteal muscles and muscles of the proximal thighs as well as of the left iliopsoas muscle and to a lesser degree of the right psoas muscle. There are severe chronic changes in both hips. 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 Less severe chronic changes of the right hip. These are progressed since the prior abdominal radiograph dated 06/26/2017. IMPRESSION: 1. Slight thickening of the anterior aspect of the bladder wall as well as slight thickening of the wall of the bladder at the base of the bladder. The findings are nonspecific but could represent cystitis or superficial mucosal neoplastic lesions. 2. 18 mm dense nodule in the left adrenal gland. This is indeterminate, atypical for an adenoma. I recommend MRI of the adrenal glands with and without contrast for further characterization. 3. Severe chronic changes in both hips, right greater than left. 4. Cholelithiasis. Aortic Atherosclerosis (ICD10-I70.0). Electronically Signed   By: Lorriane Shire M.D.   On: 07/21/2019 10:45    ASSESSMENT AND PLAN:   Patient is a 63 year old nursing home resident brought in with shortness of breath and hypoxia  *Dysphagia.  Seems to be progressively worsening.  With multiple comorbidities and and bedbound status he would not be a candidate for PEG tube. I have requested palliative care help to navigate goals of care conversation.  *Community-acquired pneumonia with COPD  exacerbation Confirmed by CT scan of the chest without contrast On IV antibiotics. Continue nebulizers and steroids  *Acute on chronic anemia.  Likely has chronic GI losses.  Stool was positive for occult blood. Appreciate GI input. Recommended outpatient work-up due to high risk for endoscopy procedures with pneumonia  *Chronic thrombocytopenia.  Platelets are improving likely due to steroids.  Possible ITP.  Discussed with Dr. Tish Men of oncology. Plan is to taper steroids over the next 3 to 4 weeks.    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  Oncology consulted.  Outpatient follow-up  * Hypothyroidism- cont levothyroxine  *Chronic diastolic congestive heart failure.  Stable.  * DVT prophylaxis No Lovenox or heparin due to thrombocytopenia  * Bedbound status at baseline-due to severe arthritis, morbid obesity, schizophrenia-patient is bedbound but can feed himself.  He is a nursing home resident for last 7 years as per his niece.  * Use of chronic steroids at home.  Likely for rheumatoid arthritis.  Continue.  * RA- cont HCQ  *Hypernatremia secondary to decreased oral intake.  All the records are reviewed and case discussed with Care Management/Social Worker Management plans discussed with the patient, family and they are in agreement.  CODE STATUS: Full  TOTAL TIME TAKING CARE OF THIS PATIENT: 35 minutes.   POSSIBLE D/C IN 1-2 DAYS, DEPENDING ON CLINICAL CONDITION.  Neita Carp M.D on 07/22/2019   Between 7am to 6pm - Pager - [phone]  After 6pm go to [email or web address] - password [email or web address] [facility]  Office  [phone]  CC: Primary care physician; [facility]  Note: This dictation was prepared with Dragon dictation along with smaller phrase technology. Any transcriptional errors that result from this process are unintentional.

## 2019-07-22 NOTE — Progress Notes (Signed)
Jerry [name]   DOB:22-Nov-1956   OV#:[id number]    Subjective: Patient is a poor historian/is able to mumble words; unable to continue a coherent conversation.  No obvious signs of bleeding noted.  Review of systems: Unable to assess given patient's psychiatric illness.  Objective:  Vitals:   07/22/19 0336 07/22/19 0830  BP: 134/69 119/71  Pulse: 98 87  Resp: 17 20  Temp: (!) 97.5 F (36.4 C) 99.1 F (37.3 C)  SpO2: 94% 97%    No intake or output data in the 24 hours ending 07/22/19 0848  Physical Exam  Constitutional:  Morbidly obese African-American male patient.  Resting in the bed.  No acute distress.  HENT:  Head: Normocephalic and atraumatic.  Mouth/Throat: Oropharynx is clear and moist. No oropharyngeal exudate.  Eyes: Pupils are equal, round, and reactive to light.  Neck: Normal range of motion. Neck supple.  Cardiovascular: Normal rate and regular rhythm.  Pulmonary/Chest: No respiratory distress. He has no wheezes.  Decreased breath sounds bilaterally at the bases.  Abdominal: Soft. Bowel sounds are normal. He exhibits no distension and no mass. There is no abdominal tenderness. There is no rebound and no guarding.  Musculoskeletal: Normal range of motion.        General: No tenderness or edema.  Neurological: He is alert.  Skin: Skin is warm.  Psychiatric:  Flat affect.     Labs:  Lab Results  Component Value Date   WBC 11.0 (H) 07/21/2019   HGB 7.9 (L) 07/21/2019   HCT 26.1 (L) 07/21/2019   MCV 87.9 07/21/2019   PLT 90 (L) 07/21/2019   NEUTROABS 2.9 07/16/2019    Lab Results  Component Value Date   NA 147 (H) 07/21/2019   K 3.4 (L) 07/21/2019   CL 109 07/21/2019   CO2 26 07/21/2019    Studies:  Ct Abdomen Pelvis W Contrast  Result Date: 07/21/2019 CLINICAL DATA:  Anemia and thrombocytopenia. EXAM: CT ABDOMEN AND PELVIS WITH CONTRAST TECHNIQUE: Multidetector CT imaging of the abdomen and pelvis was performed using the standard protocol following  bolus administration of intravenous contrast. CONTRAST:  11m OMNIPAQUE IOHEXOL 300 MG/ML  SOLN COMPARISON:  CT scan of the chest 07/16/2019 and abdominal radiographs dated 06/26/2017 FINDINGS: Lower chest: There has been partial clearing of the interstitial infiltrates at the lung bases. The patient has bronchiectasis at both lung bases posteriorly. No effusions. Heart size is within normal limits. Hepatobiliary: Liver parenchyma is normal. Multiple small gallstones. No gallbladder wall thickening. No dilated bile ducts. Pancreas: Unremarkable. No pancreatic ductal dilatation or surrounding inflammatory changes. Spleen: Normal in size without focal abnormality. Adrenals/Urinary Tract: 18 mm dense nodule in the superior aspect of the left adrenal gland. Right adrenal gland is normal. Kidneys and ureters are normal. There is slight thickening of the anterior aspect of the bladder wall as well as thickening of the wall of the bladder at the base. Stomach/Bowel: Stomach is within normal limits. Appendix appears normal. No evidence of bowel wall thickening, distention, or inflammatory changes. Vascular/Lymphatic: Aortic atherosclerosis. No enlarged abdominal or pelvic lymph nodes. Reproductive: Prostate is unremarkable. Other: No abdominal wall hernia or abnormality. No abdominopelvic ascites. Musculoskeletal: The patient has marked atrophy gluteal muscles and muscles of the proximal thighs as well as of the left iliopsoas muscle and to a lesser degree of the right psoas muscle. There are severe chronic changes in both hips. 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 Less severe chronic changes of the right hip. These  are progressed since the prior abdominal radiograph dated 06/26/2017. IMPRESSION: 1. Slight thickening of the anterior aspect of the bladder wall as well as slight thickening of the wall of the bladder at the base of the bladder. The findings are nonspecific but could represent  cystitis or superficial mucosal neoplastic lesions. 2. 18 mm dense nodule in the left adrenal gland. This is indeterminate, atypical for an adenoma. I recommend MRI of the adrenal glands with and without contrast for further characterization. 3. Severe chronic changes in both hips, right greater than left. 4. Cholelithiasis. Aortic Atherosclerosis (ICD10-I70.0). Electronically Signed   By: Lorriane Shire M.D.   On: 07/21/2019 10:45    Thrombocytopenia [facility]) #63 year old male patient with multiple medical problems including morbid obesity schizophrenia/rheumatoid arthritis COPD CHF is current admitted the hospital for worsening shortness of breath/pneumonia-noted to have thrombocytopenia/anemia  #Thrombocytopenia-chronic at least for the last 2 years to baseline around 40- 50,000.  Patient's platelets are 90s-improving on prednisone 50.  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  I think steroids could be weaned over the next 3 to 4 weeks.  CT scan shows no evidence of cirrhosis or splenomegaly/hypersplenism.   #Anemia-chronic baseline 9-10; normocytic-likely secondary to severe rheumatoid arthritis.  Recent worsening currently 7.5-will need GI evaluation after improvement of the acute issues/pneumonia.  A bone marrow biopsy could be considered however given patient's comorbidities/poor functional status I think is reasonable to hold off at this time.   #Schizophrenia/nonverbal-unclear baseline.   #Discussed with Dr. Darvin Neighbours.   Cammie Sickle, MD 07/22/2019  8:48 AM

## 2019-07-22 NOTE — Plan of Care (Signed)
Pt unable to communicate understanding. Did discuss daily care with pt but no evidence of learning due to cognition. Pdowless, rn

## 2019-07-22 NOTE — Consult Note (Signed)
Consultation Note Date: 07/22/2019   Patient Name: Jerry [name]  DOB: 10-05-1956  MRN: [id number]  Age / Sex: 63 y.o., male  PCP: [facility] Referring Physician: Hillary Bow, MD  Reason for Consultation: Establishing goals of care  HPI/Patient Profile: 63 y.o. male  with past medical history of schizophrenia, crippling rheumatoid arthritis, resident of [facility] for 7 years, morbid obesity, COPD, CHF, HTN, BPH, thyroid disease admitted on 07/16/2019 with community-acquired pneumonia, COPD exacerbation, acute on chronic anemia, aphasia.   Clinical Assessment and Goals of Care: Jerry [name], Jerry [name], is lying quietly in bed.  He will make and mostly keep eye contact.  He is able to tell me his name, but not where we are.  I reassure him that he is safe, at [facility].  There is no family at bedside at this time.  Jerry [name]' meal tray is at bedside, untouched.  He has severe crippling arthritis, and I assist him with eating.  He agrees to eat some yogurt with peach pure.  He is able to swallow readily, but does have throat clearing after each bite.  He will only take 3 bites before he is unwilling to eat anything else.  During our conversation, Jerry [name] looks toward the other side of the bed, holds up his hand, and states "hang on, Sir".  Later in our conversation Jerry [name] does the same.  It is clear that he is having auditory hallucinations, but is unable to adequately express what he is hearing or perceiving.  Call to legal guardian/niece, Jerry [name] at [phone].  We talk ain detail about the treatment plan, including, Onco/heme consult, HCAP/COPD and treatment plan, Chronic blood dyscrasia, and dysphagia with diet and treatment plan.  We talk about difficulty with bone marrow bx.  We talk about how to make choices for loved one, 1) keeping them at the center, 2) are we doing  something for them or to them.   Jerry's tells me that her mother was an Therapist, sports and she was pre-med, but now teaches A&P classes. Their family is experienced.  Jerry endorses that "Jerry [name] is on a downward spiral, due to chronic illness, many times in hospital".   Want him around, but also comfortable.  We discuss psyche meds, and Jerry shares they want to find out if there is a reversible cause for [name]'s decline.   We talk about code status; "treat the treatable, but allow a natural death".  She tells me that Jerry [name] tells family that he "doesn't want to suffer, but wants to live".    We plan a follow up call around 11:30 if possible, or after 3pm.    HCPOA    LEGAL GUARDIAN -  legal guardian/niece, Jerry [name] at [phone] she is a Education officer, museum.  Jerry has guardianship paperwork since 2015-16. Jerry lives in [place].  Family who lives nearby to help as needed include Jerry's brother Jerry [name], in [place], and 1st cousin Jerry [name].    Sister, Jerry [name]  Jerry [name] is in-state proxy, per guardianship paperwork.    SUMMARY OF RECOMMENDATIONS   Return to [facility], where he has been a resident for 7 years.  Continue to treat the treatable, Full code/sull scope.  Jerry tells me that she does not think [name] would tolerate a bone marrow biopsy.  Would like GI workout, understands this would be outpatient.  Jerry shares that [name] "doesn't want to suffer, but wants to live".    Code Status/Advance Care Planning:  Full code - We talk about "treat the treatable, but allow a natural death".  Jerry tells me that she will discuss code status with her relatives. She endorses a declines in function and health in the last year.   Symptom Management:   Per hospitalist, no additional needs at this time.  Palliative Prophylaxis:   Turn Reposition  Additional Recommendations (Limitations, Scope, Preferences):  Full Scope Treatment  Psycho-social/Spiritual:   Desire for further Chaplaincy support:no  Additional Recommendations:  Caregiving  Support/Resources and Education on Hospice  Prognosis:   Unable to determine, based on outcomes. 1 year or less would not be surprising based on bedbound status, morbid obesity, psychiatric illness, dysphasia.  Discharge Planning: Return to [facility] where he has been a resident for 7 years.      Primary Diagnoses: Present on Admission: **None**   I have reviewed the medical record, interviewed the patient and family, and examined the patient. The following aspects are pertinent.  Past Medical History:  Diagnosis Date  . BPH (benign prostatic hyperplasia)   . CHF (congestive heart failure) ([place])   . Chronic pain   . COPD (chronic obstructive pulmonary disease) ([place])   . Hypertension   . Morbid obesity ([place])   . Pressure ulcer   . Rheumatoid arthritis ([place])   . Schizophrenia ([place])   . Thyroid disease    Social History   Socioeconomic History  . Marital status: Single    Spouse name: Not on file  . Number of children: Not on file  . Years of education: Not on file  . Highest education level: Not on file  Occupational History  . Not on file  Social Needs  . Financial resource strain: Not on file  . Food insecurity    Worry: Not on file    Inability: Not on file  . Transportation needs    Medical: Not on file    Non-medical: Not on file  Tobacco Use  . Smoking status: Never Smoker  . Smokeless tobacco: Never Used  Substance and Sexual Activity  . Alcohol use: No  . Drug use: No  . Sexual activity: Not on file  Lifestyle  . Physical activity    Days per week: Not on file    Minutes per session: Not on file  . Stress: Not on file  Relationships  . Social Herbalist on phone: Not on file    Gets together: Not on file    Attends religious service: Not on file    Active member of club or organization: Not on file    Attends meetings of clubs or organizations: Not on file    Relationship status: Not on file  Other Topics Concern  .  Not on file  Social History Narrative  . Not on file   History reviewed. No pertinent family history. Scheduled Meds: . benztropine  0.5 mg Oral BID  . Chlorhexidine Gluconate Cloth  6 each Topical Q0600  . docusate sodium  100  mg Oral BID  . fluPHENAZine  12.5 mg Oral Daily  . fluPHENAZine  15 mg Oral QHS  . hydrocortisone  10 mg Oral BID  . hydroxychloroquine  200 mg Oral BID  . levothyroxine  200 mcg Oral QAC breakfast  . levothyroxine  25 mcg Oral QAC breakfast  . methocarbamol  750 mg Oral QHS  . metoprolol tartrate  12.5 mg Oral BID  . mometasone-formoterol  2 puff Inhalation BID  . multivitamin with minerals  1 tablet Oral Daily  . mupirocin ointment  1 application Nasal BID  . pantoprazole  40 mg Oral BID AC  . potassium chloride  10 mEq Oral Daily  . predniSONE  50 mg Oral Q breakfast  . sodium chloride flush  3 mL Intravenous Q12H  . tamsulosin  0.4 mg Oral Daily  . vitamin C  500 mg Oral BID   Continuous Infusions: . sodium chloride Stopped (07/20/19 1742)  . azithromycin 500 mg (07/21/19 1944)   PRN Meds:.sodium chloride, acetaminophen **OR** acetaminophen, albuterol, glycopyrrolate, metoprolol tartrate, ondansetron **OR** ondansetron (ZOFRAN) IV, sodium chloride flush Medications Prior to Admission:  Prior to Admission medications   Medication Sig Start Date End Date Taking? Authorizing Provider  ammonium lactate (LAC-HYDRIN) 12 % lotion Apply 1 application topically 2 (two) times daily. Apply topically to bilateral feet   Yes [provider]  benztropine (COGENTIN) 0.5 MG tablet Take 0.5 mg by mouth 2 (two) times daily.   Yes [provider]  clonazePAM (KLONOPIN) 1 MG tablet Take 1 tablet (1 mg total) by mouth at bedtime. 07/02/17  Yes Max Sane, MD  docusate sodium (COLACE) 100 MG capsule Take 100 mg by mouth 2 (two) times daily.   Yes [provider]  fluPHENAZine (PROLIXIN) 2.5 MG tablet Take 5 tablets (12.5 mg total) by mouth daily.  Take 12.5 mg by mouth in the morning and 15 mg by mouth at bedtime. Patient taking differently: Take 12.5-15 mg by mouth 2 (two) times daily. Take two and one and one-half tablets (12.5 mg) in the morning and three tablets (15 mg) in the evening 07/02/17  Yes Max Sane, MD  hydrochlorothiazide (HYDRODIURIL) 12.5 MG tablet Take 12.5 mg by mouth daily.   Yes [provider]  hydrocortisone (CORTEF) 10 MG tablet Take 1 tablet (10 mg total) by mouth 2 (two) times daily. 12/30/17  Yes Fritzi Mandes, MD  hydroxychloroquine (PLAQUENIL) 200 MG tablet Take 200 mg by mouth 2 (two) times daily.   Yes [provider]  levothyroxine (SYNTHROID) 200 MCG tablet Take 200 mcg by mouth daily before breakfast. Take along with 25 mcg tablet for total of 225 mcg daily   Yes [provider]  levothyroxine (SYNTHROID) 25 MCG tablet Take 25 mcg by mouth daily before breakfast. Take along with 200 mcg tablet for total of 225 mcg daily   Yes [provider]  methocarbamol (ROBAXIN) 750 MG tablet Take 750 mg by mouth at bedtime.   Yes [provider]  metoprolol tartrate (LOPRESSOR) 25 MG tablet Take 25 mg by mouth 2 (two) times daily.   Yes [provider]  Multiple Vitamins-Minerals (MULTIVITAMIN WITH MINERALS) tablet Take 1 tablet by mouth daily.   Yes [provider]  Polyethylene Glycol 3350 (MIRALAX PO) Take 17 g by mouth daily.   Yes [provider]  potassium chloride (MICRO-K) 10 MEQ CR capsule Take 10 mEq by mouth daily.   Yes [provider]  tamsulosin (FLOMAX) 0.4 MG CAPS capsule  Take 0.4 mg by mouth daily.   Yes [provider]  torsemide (DEMADEX) 10 MG tablet Take 10 mg by mouth daily.   Yes [provider]  traZODone (DESYREL) 100 MG tablet Take 100-200 mg by mouth at bedtime.   Yes [provider]  vitamin C (ASCORBIC ACID) 250 MG tablet Take 500 mg by mouth 2 (two) times daily.   Yes [provider]  albuterol (PROVENTIL HFA;VENTOLIN HFA) 108 (90 Base) MCG/ACT inhaler Inhale 1 puff into the lungs every 3 (three) hours as needed for wheezing or shortness of breath.    [provider]  protein supplement shake (PREMIER PROTEIN) LIQD Take 325 mLs (11 oz total) by mouth 3 (three) times daily between meals. 08/13/17   Epifanio Lesches, MD   Allergies  Allergen Reactions  . Methadone   . Penicillins     Tolerates cephalosporins   . Propoxyphene   . Sulfa Antibiotics   . Valproic Acid And Related     Thrombocytopenia   Review of Systems  Unable to perform ROS: Psychiatric disorder    Physical Exam Vitals signs and nursing note reviewed.  Constitutional:      General: He is not in acute distress.    Appearance: He is obese.     Comments: Will make and somewhat keep eye contact. Appears obese, chronically ill and frail.   HENT:     Head: Atraumatic.  Cardiovascular:     Rate and Rhythm: Normal rate.  Pulmonary:     Effort: Pulmonary effort is normal. No tachypnea or accessory muscle usage.  Abdominal:     Palpations: Abdomen is soft.  Musculoskeletal:     Right lower leg: No edema.     Left lower leg: No edema.  Skin:    General: Skin is warm and dry.  Neurological:     Mental Status: He is alert.     Comments: Oriented to self only at this time, known schizophrenia.   Psychiatric:     Comments: Auditory hallucinations      Vital Signs: BP 119/71   Pulse 87   Temp 99.1 F (37.3 C) (Oral)   Resp 20   Ht '5\' 8"'  (1.727 m)   Wt (!) 153.2 kg   SpO2 97%   BMI 51.35 kg/m  Pain Scale: PAINAD   Pain Score: Asleep   SpO2: SpO2: 97 % O2 Device:SpO2: 97 % O2 Flow Rate: .O2 Flow Rate (L/min): 2 L/min  IO: Intake/output summary: No intake or output data in the 24 hours ending 07/22/19 1113  LBM: Last BM Date: 07/22/19 Baseline Weight: Weight: (!) 153.2 kg Most recent weight: Weight: (!) 153.2 kg     Palliative Assessment/Data:   Flowsheet Rows      Most Recent Value  Intake Tab  Referral Department  Hospitalist  Unit at Time of Referral  Med/Surg Unit  Palliative Care Primary Diagnosis  Pulmonary  Date Notified  07/21/19  Palliative Care Type  Return patient Palliative Care  Reason for referral  Clarify Goals of Care  Date of Admission  07/16/19  Date first seen by Palliative Care  07/22/19  # of days Palliative referral response time  1 Day(s)  # of days IP prior to Palliative referral  5  Clinical Assessment  Palliative Performance Scale Score  40%  Pain Max last 24 hours  Not able to report  Pain Min Last 24 hours  Not able to report  Dyspnea Max Last 24 Hours  Not able to report  Dyspnea Min Last 24 hours  Not able to report  Psychosocial & Spiritual Assessment  Palliative Care Outcomes      Time In: 1020 Time Out: 1140 Time Total: 80 minutes  Greater than 50%  of this time was spent counseling and coordinating care related to the above assessment and plan.  Signed by: Drue Novel, NP   Please contact Palliative Medicine Team phone at [phone] for questions and concerns.  For individual provider: See Shea Evans

## 2019-07-22 NOTE — Progress Notes (Signed)
Pt remains unable to swallow applesauce or thickened liquids. Pt unable ot take PO meds due to holding in mouth and then spitting out. Pt didn't seem to understand that this RN wanted him to swallow them. Held remainder of PO meds this shift due to inability to swallow safely. Pt given bolus of 154ml of NS for coverage of hydration. Will continue to monitor. Pdowless, rn

## 2019-07-22 NOTE — TOC Progression Note (Signed)
Transition of Care [facility]) - Progression Note    Patient Details  Name: Korin Saladino MRN: [id number] Date of Birth: [birth year]  Transition of [facility]) CM/SW Contact  Caydin Yeatts, Lenice Llamas Phone Number: [phone]  07/22/2019, 3:08 PM  Clinical Narrative: Per MD progress note today patient will be ready for D/C in 1-2 days. Last negative covid test was on 8/24. Per Otila Kluver [facility] liaison if patient does not D/C to [facility] tomorrow then he will need another covid test before returning to [facility]. CSW will continue to follow and assist as needed.      Expected Discharge Plan: [place] Barriers to Discharge: Continued Medical Work up  Expected Discharge Plan and Services Expected Discharge Plan: [place] In-house Referral: Clinical Social Work Discharge Planning Services: CM Consult   Living arrangements for the past 2 months: [place] Expected Discharge Date: 07/19/19               DME Arranged: N/A         HH Arranged: NA           Social Determinants of Health (SDOH) Interventions    Readmission Risk Interventions No flowsheet data found.

## 2019-07-23 DIAGNOSIS — Z7189 Other specified counseling: Secondary | ICD-10-CM

## 2019-07-23 DIAGNOSIS — Z515 Encounter for palliative care: Secondary | ICD-10-CM

## 2019-07-23 LAB — BASIC METABOLIC PANEL
Anion gap: 10 (ref 5–15)
BUN: 19 mg/dL (ref 8–23)
CO2: 26 mmol/L (ref 22–32)
Calcium: 8.3 mg/dL — ABNORMAL LOW (ref 8.9–10.3)
Chloride: 113 mmol/L — ABNORMAL HIGH (ref 98–111)
Creatinine, Ser: 0.81 mg/dL (ref 0.61–1.24)
GFR calc Af Amer: >60 mL/min (ref >60)
GFR calc non Af Amer: >60 mL/min (ref >60)
Glucose, Bld: 97 mg/dL (ref 70–99)
Potassium: 3.6 mmol/L (ref 3.5–5.1)
Sodium: 149 mmol/L — ABNORMAL HIGH (ref 135–145)

## 2019-07-23 LAB — CBC WITH DIFFERENTIAL/PLATELET
Abs Immature Granulocytes: 0.06 10*3/uL (ref 0.00–0.07)
Basophils Absolute: 0 10*3/uL (ref 0.0–0.1)
Basophils Relative: 0 %
Eosinophils Absolute: 0 10*3/uL (ref 0.0–0.5)
Eosinophils Relative: 0 %
HCT: 23.8 % — ABNORMAL LOW (ref 39.0–52.0)
Hemoglobin: 7.2 g/dL — ABNORMAL LOW (ref 13.0–17.0)
Immature Granulocytes: 1 %
Lymphocytes Relative: 10 %
Lymphs Abs: 1 10*3/uL (ref 0.7–4.0)
MCH: 26.7 pg (ref 26.0–34.0)
MCHC: 30.3 g/dL (ref 30.0–36.0)
MCV: 88.1 fL (ref 80.0–100.0)
Monocytes Absolute: 0.9 10*3/uL (ref 0.1–1.0)
Monocytes Relative: 10 %
Neutro Abs: 7.3 10*3/uL (ref 1.7–7.7)
Neutrophils Relative %: 79 %
Platelets: 90 10*3/uL — ABNORMAL LOW (ref 150–400)
RBC: 2.7 MIL/uL — ABNORMAL LOW (ref 4.22–5.81)
RDW: 22.4 % — ABNORMAL HIGH (ref 11.5–15.5)
WBC: 9.2 10*3/uL (ref 4.0–10.5)
nRBC: 0.3 % — ABNORMAL HIGH (ref 0.0–0.2)

## 2019-07-23 NOTE — TOC Progression Note (Signed)
Transition of Care [facility]) - Progression Note    Patient Details  Name: Jerry [name] MRN: [id number] Date of Birth: 11/08/1956  [facility]) CM/SW Contact  Asuzena Weis, Lenice Llamas Phone Number: [phone]  07/23/2019, 11:35 AM  Clinical Narrative: Clinical Social Worker (CSW) made [facility] liaison aware that per MD patient will not D/C today. CSW made MD aware that patient will need another covid test prior to D/C.     Expected Discharge Plan: [place] Barriers to Discharge: Continued Medical Work up  Expected Discharge Plan and Services Expected Discharge Plan: [place] In-house Referral: Clinical Social Work Discharge Planning Services: CM Consult   Living arrangements for the past 2 months: [place] Expected Discharge Date: 07/19/19               DME Arranged: N/A         HH Arranged: NA           Social Determinants of Health (SDOH) Interventions    Readmission Risk Interventions No flowsheet data found.

## 2019-07-23 NOTE — Progress Notes (Signed)
[facility] [place] NAME: Jerry [name]    MR#:  [id number]  DATE OF BIRTH:  09-Mar-1956  SUBJECTIVE:  CHIEF COMPLAINT:   Chief Complaint  Patient presents with  . Shortness of Breath   [facility] for last 7 years and is bedbound at his baseline due to significant and severe arthritis and schizophrenia.  Admitted for pneumonia.  Also found to have anemia and thrombocytopenia.  Patient is awake but confused.  Unable to get much history.  But overall seems comfortable.  Was able to eat breakfast fed by his nurse.  Took medications.  REVIEW OF SYSTEMS:   Due to altered mental status patient cannot get review of system.  DRUG ALLERGIES:   Allergies  Allergen Reactions  . Methadone   . Penicillins     Tolerates cephalosporins   . Propoxyphene   . Sulfa Antibiotics   . Valproic Acid And Related     Thrombocytopenia    VITALS:  Blood pressure 136/78, pulse 95, temperature 99.5 F (37.5 C), temperature source Oral, resp. rate 18, height 5\' 8"  (1.727 m), weight (!) 153.2 kg, SpO2 94 %.  PHYSICAL EXAMINATION:  GENERAL:  63 y.o.-year-old obese patient lying in the bed with no acute distress.  EYES: Pupils equal, round, reactive to light and accommodation. No scleral icterus. Extraocular muscles intact.  HEENT: Head atraumatic, normocephalic. Oropharynx and nasopharynx clear.  NECK:  Supple, no jugular venous distention. No thyroid enlargement, no tenderness.  LUNGS: Decreased breath sound at the bases.  No wheezing. CARDIOVASCULAR: S1, S2 normal. No murmurs, rubs, or gallops.  ABDOMEN: Soft, nontender, nondistended. Bowel sounds present. No organomegaly or mass.  EXTREMITIES: No pedal edema, cyanosis, or clubbing.  Disuse atrophy and deformities present in both lower extremities and both hands have significant deformity due to arthritis. NEUROLOGIC: Cranial nerves II through XII are intact. Muscle strength  3/5 in both upper extremities 0/5 in both lower extremities. Sensation intact. Gait not checked.  PSYCHIATRIC: The patient is alert and awake.  Confused SKIN: No obvious rash, lesion, or ulcer.   Physical Exam LABORATORY PANEL:   CBC Recent Labs  Lab 07/23/19 0617  WBC 9.2  HGB 7.2*  HCT 23.8*  PLT 90*   ------------------------------------------------------------------------------------------------------------------  Chemistries  Recent Labs  Lab 07/23/19 0617  NA 149*  K 3.6  CL 113*  CO2 26  GLUCOSE 97  BUN 19  CREATININE 0.81  CALCIUM 8.3*   ------------------------------------------------------------------------------------------------------------------  Cardiac Enzymes No results for input(s): TROPONINI in the last 168 hours. ------------------------------------------------------------------------------------------------------------------  RADIOLOGY:  No results found.  ASSESSMENT AND PLAN:   Patient is a 63 year old nursing home resident brought in with shortness of breath and hypoxia  *Dysphagia.  Seems to be progressively worsening.  With multiple comorbidities and and bedbound status he would not be a candidate for PEG tube.  Also would likely pull any tube that will be placed with his confusion. Discussed with Scarlette Ar Ray his niece and healthcare power of attorney over the phone.  Advised IV fluids would be only temporary fix.  Explained if patient unable to meet nutritional and fluid needs he would be a candidate for hospice.  *Community-acquired pneumonia with COPD exacerbation Confirmed by CT scan of the chest without contrast On IV antibiotics. Continue nebulizers and steroids  *Acute on chronic anemia.  Likely has chronic GI losses.  Stool was positive for occult blood. Appreciate GI input. Recommended outpatient work-up due to high risk  for endoscopy procedures with pneumonia  *Chronic thrombocytopenia.  Platelets are improving likely due to  steroids.  Possible ITP.  Discussed with Dr. Tish Men of oncology. Plan is to taper steroids over the next 3 to 4 weeks.  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  Oncology consulted.  Outpatient follow-up  * Hypothyroidism- cont levothyroxine  *Chronic diastolic congestive heart failure.  Stable.  * DVT prophylaxis No Lovenox or heparin due to thrombocytopenia  * Bedbound status at baseline-due to severe arthritis, morbid obesity, schizophrenia-patient is bedbound but can feed himself.  He is a nursing home resident for last 7 years as per his niece.  * RA- cont HCQ  *Hypernatremia secondary to decreased oral intake.  All the records are reviewed and case discussed with Care Management/Social Worker Management plans discussed with the patient, family and they are in agreement.  CODE STATUS: Full  TOTAL TIME TAKING CARE OF THIS PATIENT: 35 minutes.   POSSIBLE D/C IN 1-2 DAYS, DEPENDING ON CLINICAL CONDITION.  Neita Carp M.D on 07/23/2019   Between 7am to 6pm - Pager - [phone]  After 6pm go to [email or web address] - password [email or web address] [place] [facility]  Office  [phone]  CC: Primary care physician; [facility]  Note: This dictation was prepared with Dragon dictation along with smaller phrase technology. Any transcriptional errors that result from this process are unintentional.

## 2019-07-23 NOTE — Consult Note (Signed)
Attempted to assess patient today.  Patient is not having a comprehendible conversation at this moment.  Nursing reports that the patient has been showing signs of responding to hallucinations.  [facility] and spoke to the staff there.  They report that the patient does have hallucinations from time to time and is usually due to an illness.  They state that he is normally stable on his Prolixin.  They state that once his illness resolves that normally he stabilizes more.  The staff also reported that recently the patient became worse with his pneumonia and that he has been having worsening hallucinations as well as making derogatory comments towards staff.  They state that normally he is a pleasant, laughing, and talkative individual.  We will attempt to reassess patient tomorrow.

## 2019-07-23 NOTE — Plan of Care (Signed)
  Problem: Education: Goal: Ability to identify signs and symptoms of gastrointestinal bleeding will improve Outcome: Progressing   Problem: Fluid Volume: Goal: Will show no signs and symptoms of excessive bleeding Outcome: Progressing   Problem: Clinical Measurements: Goal: Complications related to the disease process, condition or treatment will be avoided or minimized Outcome: Progressing   Problem: Clinical Measurements: Goal: Complications related to the disease process, condition or treatment will be avoided or minimized Outcome: Progressing   Problem: Education: Goal: Knowledge of General Education information will improve Description: Including pain rating scale, medication(s)/side effects and non-pharmacologic comfort measures Outcome: Progressing   Problem: Health Behavior/Discharge Planning: Goal: Ability to manage health-related needs will improve Outcome: Progressing   Problem: Clinical Measurements: Goal: Ability to maintain clinical measurements within normal limits will improve Outcome: Progressing Goal: Will remain free from infection Outcome: Progressing Goal: Diagnostic test results will improve Outcome: Progressing Goal: Respiratory complications will improve Outcome: Progressing Goal: Cardiovascular complication will be avoided Outcome: Progressing   Problem: Nutrition: Goal: Adequate nutrition will be maintained Outcome: Progressing   Problem: Coping: Goal: Level of anxiety will decrease Outcome: Progressing   Problem: Elimination: Goal: Will not experience complications related to bowel motility Outcome: Progressing Goal: Will not experience complications related to urinary retention Outcome: Progressing   Problem: Pain Managment: Goal: General experience of comfort will improve Outcome: Progressing   Problem: Safety: Goal: Ability to remain free from injury will improve Outcome: Progressing   Problem: Skin Integrity: Goal: Risk for  impaired skin integrity will decrease Outcome: Progressing

## 2019-07-23 NOTE — Progress Notes (Signed)
Palliative: [name], [name], is resting quietly in bed.  He makes and generally keeps eye contact. He tells me multiple times that he wants to return to his room, return home.  He also tells me that he would like to have a full container of ice cream.  His breakfast tray is at bedside and his has eaten most of his breakfast.  There is no family at bedside at this time.   Call to legal guardian/niece, Jerry [name] at [phone].   I share that Jerry [name] seems more paranoid today, but does not endorse auditory hallucinations at this time.  We talk in detail about recent labs and increasing sodium.  We talk about thickened liquids and risk for poor po intake. We talk about the benefit of returning to [facility], his "home" with out patient Palliative care vs Hospice care.  We talk about out patient Palliative care vs hospice care.  (Hospice would not focus on further testing).   I encourage Jerry to consider if Jerry [name] would want/benefit multiple visits for outpatient testing.    We talk about code status.  Jerry shares that she and her cousin spoke last night and they "want him to be at ease, no pain" and are considering DNR.  Jerry tells me that she is waiting to hear from the hospitalist.   Plan:  Out patient Palliative to follow, considering Hospice care. Considering DNR.  Return to residential SNF [facility].   81 minutes Jerry Axe, NP Palliative Medicine Team Team Phone # [phone] Greater than 50% of this time was spent counseling and coordinating care related to the above assessment and plan.

## 2019-07-24 LAB — BASIC METABOLIC PANEL
Anion gap: 10 (ref 5–15)
BUN: 25 mg/dL — ABNORMAL HIGH (ref 8–23)
CO2: 26 mmol/L (ref 22–32)
Calcium: 8.4 mg/dL — ABNORMAL LOW (ref 8.9–10.3)
Chloride: 115 mmol/L — ABNORMAL HIGH (ref 98–111)
Creatinine, Ser: 0.86 mg/dL (ref 0.61–1.24)
GFR calc Af Amer: >60 mL/min (ref >60)
GFR calc non Af Amer: >60 mL/min (ref >60)
Glucose, Bld: 102 mg/dL — ABNORMAL HIGH (ref 70–99)
Potassium: 2.9 mmol/L — ABNORMAL LOW (ref 3.5–5.1)
Sodium: 151 mmol/L — ABNORMAL HIGH (ref 135–145)

## 2019-07-24 LAB — CBC
HCT: 25.2 % — ABNORMAL LOW (ref 39.0–52.0)
Hemoglobin: 7.6 g/dL — ABNORMAL LOW (ref 13.0–17.0)
MCH: 27 pg (ref 26.0–34.0)
MCHC: 30.2 g/dL (ref 30.0–36.0)
MCV: 89.4 fL (ref 80.0–100.0)
Platelets: 127 10*3/uL — ABNORMAL LOW (ref 150–400)
RBC: 2.82 MIL/uL — ABNORMAL LOW (ref 4.22–5.81)
RDW: 22.3 % — ABNORMAL HIGH (ref 11.5–15.5)
WBC: 6.8 10*3/uL (ref 4.0–10.5)
nRBC: 0.4 % — ABNORMAL HIGH (ref 0.0–0.2)

## 2019-07-24 MED ORDER — POTASSIUM CHLORIDE CRYS ER 20 MEQ PO TBCR
40.0000 meq | EXTENDED_RELEASE_TABLET | ORAL | Status: DC
  Administered 2019-07-24: 40 meq via ORAL
  Filled 2019-07-24 (×2): qty 2

## 2019-07-24 MED ORDER — POTASSIUM CHLORIDE CRYS ER 20 MEQ PO TBCR: 40.0000 meq | EXTENDED_RELEASE_TABLET | ORAL | Status: DC

## 2019-07-24 MED ORDER — POTASSIUM CHLORIDE CRYS ER 20 MEQ PO TBCR: 40.0000 meq | EXTENDED_RELEASE_TABLET | Freq: Once | ORAL | Status: DC

## 2019-07-24 MED ORDER — METOPROLOL TARTRATE 50 MG PO TABS
50.0000 mg | ORAL_TABLET | Freq: Two times a day (BID) | ORAL | Status: DC
  Administered 2019-07-24 – 2019-07-28 (×8): 50 mg via ORAL
  Filled 2019-07-24 (×11): qty 1

## 2019-07-24 MED ORDER — DOCUSATE SODIUM 50 MG/5ML PO LIQD
100.0000 mg | Freq: Two times a day (BID) | ORAL | Status: DC
  Administered 2019-07-24 – 2019-07-28 (×7): 100 mg via ORAL
  Filled 2019-07-24 (×10): qty 10

## 2019-07-24 MED ORDER — POTASSIUM CHLORIDE CRYS ER 10 MEQ PO TBCR
10.0000 meq | EXTENDED_RELEASE_TABLET | Freq: Every day | ORAL | Status: DC
  Administered 2019-07-25 – 2019-07-28 (×4): 10 meq via ORAL
  Filled 2019-07-24 (×4): qty 1

## 2019-07-24 MED ORDER — POTASSIUM CHLORIDE 20 MEQ/15ML (10%) PO SOLN: 10.0000 meq | Freq: Every day | ORAL | Status: DC

## 2019-07-24 MED ORDER — POTASSIUM CHLORIDE 20 MEQ/15ML (10%) PO SOLN
40.0000 meq | ORAL | Status: DC
  Administered 2019-07-24: 40 meq via ORAL
  Filled 2019-07-24: qty 30

## 2019-07-24 NOTE — Progress Notes (Signed)
Palliative: [name] is resting quietly in bed.  He greets me making and mostly keeping eye contact.  He again today is oriented to person and place, but is confused at times.  His sodium levels continue to rise.  He seems to hallucinate at times. Jerry [name] is able to take applesauce from me, stating he would like some water.  There is no family at bedside at this time.  Conference with attending a bedside nursing staff related to patient condition, needs, Jerry [name] water protocol, CODE STATUS.  Plan: Outpatient palliative services to follow.  Continue CODE STATUS discussions with family.  77 minutes Jerry Axe, NP Palliative Medicine Team Team Phone # [phone] Greater than 50% of this time was spent counseling and coordinating care related to the above assessment and plan.

## 2019-07-24 NOTE — Progress Notes (Signed)
New referral for outpatient Palliative to follow at [facility] received following a Palliative Medicine consult. CSW [employer] made aware. Patient information faxed to referral. Flo Shanks BSN, RN, [place] [phone]

## 2019-07-24 NOTE — Consult Note (Signed)
Attempted to assess patient today.  Patient continues to have incoherent conversation with meaningless words.  At one point the patient can be talking about cigarettes and then going to counting numbers.  Patient is unable to answer questions appropriately with any logic.  However will note that patient does appear to be more alert today and is smiling when he talks and there have been no reports of the patient making any type of derogatory comments.  Only patient only has to be reassessed for tomorrow.

## 2019-07-25 LAB — BASIC METABOLIC PANEL
Anion gap: 9 (ref 5–15)
BUN: 25 mg/dL — ABNORMAL HIGH (ref 8–23)
CO2: 25 mmol/L (ref 22–32)
Calcium: 8.5 mg/dL — ABNORMAL LOW (ref 8.9–10.3)
Chloride: 122 mmol/L — ABNORMAL HIGH (ref 98–111)
Creatinine, Ser: 0.93 mg/dL (ref 0.61–1.24)
GFR calc Af Amer: >60 mL/min (ref >60)
GFR calc non Af Amer: >60 mL/min (ref >60)
Glucose, Bld: 86 mg/dL (ref 70–99)
Potassium: 3.6 mmol/L (ref 3.5–5.1)
Sodium: 156 mmol/L — ABNORMAL HIGH (ref 135–145)

## 2019-07-25 LAB — TSH: TSH: 0.25 u[IU]/mL — ABNORMAL LOW (ref 0.350–4.500)

## 2019-07-25 MED ORDER — ENOXAPARIN SODIUM 40 MG/0.4ML ~~LOC~~ SOLN
40.0000 mg | Freq: Two times a day (BID) | SUBCUTANEOUS | Status: DC
  Administered 2019-07-25 – 2019-07-28 (×6): 40 mg via SUBCUTANEOUS
  Filled 2019-07-25 (×6): qty 0.4

## 2019-07-25 NOTE — Consult Note (Signed)
Patient was seen by this provider in person.  He was difficult to understand, incomprehensible for the most part.  Thought he said, "I'm trying to make it."  Unable to assess at this time.  Medications in place for his schizophrenia.  No agitation noted.  Will try again tomorrow.  Waylan Boga, PMHNP

## 2019-07-25 NOTE — Progress Notes (Signed)
  Advance care planning  Purpose of Encounter Dehydration, hypernatremia  Parties in Attendance Patient, his healthcare power of attorney niece Jerry [name] over the phone  Patients Decisional capacity Patient  is confused and unable to make medical decisions  Discussed in detail regarding dehydration, hyponatremia.  Treatment plan , prognosis discussed.  All questions answered.  We discussed regarding patient having minimal fluid intake.  Aspiration risk.  I have advised that IV fluids would be a temporary fix and patient will get dehydrated once those are stopped.  He is not a candidate for PEG tube as he will likely pull it out once place, explained this to the knees.  Needs tells me that they have made a decision to make him DNR/DNI.  She wants to discuss further with her cousin before making a decision by tomorrow.  I have advised that we not stop IV fluids and see how he does on his own.  She agrees.  Understands patient's worsening sickness and high risk for deterioration and death.  Orders entered and CODE STATUS changed  DNR/DNI  Time spent - 20 minutes

## 2019-07-25 NOTE — Plan of Care (Signed)
  Problem: Education: Goal: Ability to identify signs and symptoms of gastrointestinal bleeding will improve Outcome: Progressing   Problem: Fluid Volume: Goal: Will show no signs and symptoms of excessive bleeding Outcome: Progressing   Problem: Clinical Measurements: Goal: Complications related to the disease process, condition or treatment will be avoided or minimized Outcome: Progressing   Problem: Education: Goal: Knowledge of General Education information will improve Description: Including pain rating scale, medication(s)/side effects and non-pharmacologic comfort measures Outcome: Progressing   Problem: Health Behavior/Discharge Planning: Goal: Ability to manage health-related needs will improve Outcome: Progressing   Problem: Health Behavior/Discharge Planning: Goal: Ability to manage health-related needs will improve Outcome: Progressing   Problem: Clinical Measurements: Goal: Ability to maintain clinical measurements within normal limits will improve Outcome: Progressing Goal: Will remain free from infection Outcome: Progressing Goal: Diagnostic test results will improve Outcome: Progressing Goal: Respiratory complications will improve Outcome: Progressing Goal: Cardiovascular complication will be avoided Outcome: Progressing   Problem: Nutrition: Goal: Adequate nutrition will be maintained Outcome: Progressing   Problem: Coping: Goal: Level of anxiety will decrease Outcome: Progressing   Problem: Elimination: Goal: Will not experience complications related to bowel motility Outcome: Progressing Goal: Will not experience complications related to urinary retention Outcome: Progressing   Problem: Pain Managment: Goal: General experience of comfort will improve Outcome: Progressing   Problem: Safety: Goal: Ability to remain free from injury will improve Outcome: Progressing   Problem: Skin Integrity: Goal: Risk for impaired skin integrity will  decrease Outcome: Progressing

## 2019-07-25 NOTE — Progress Notes (Signed)
[place] at [place] NAME: Jerry [name]    MR#:  [id number]  DATE OF BIRTH:  1956-02-08  SUBJECTIVE:  CHIEF COMPLAINT:   Chief Complaint  Patient presents with  . Shortness of Breath   Sent from [facility]-where he lives for last 7 years and is bedbound at his baseline due to significant and severe arthritis and schizophrenia.  Admitted for pneumonia.  Also found to have anemia and thrombocytopenia.  Patient is awake but confused.  Unable to get much history.   REVIEW OF SYSTEMS:   Due to altered mental status patient cannot get review of system.  DRUG ALLERGIES:   Allergies  Allergen Reactions  . Methadone   . Penicillins     Tolerates cephalosporins   . Propoxyphene   . Sulfa Antibiotics   . Valproic Acid And Related     Thrombocytopenia    VITALS:  Blood pressure 139/86, pulse 81, temperature 98.4 F (36.9 C), resp. rate 16, height 5\' 8"  (1.727 m), weight (!) 153.2 kg, SpO2 92 %.  PHYSICAL EXAMINATION:  GENERAL:  63 y.o.-year-old obese patient lying in the bed with no acute distress.  EYES: Pupils equal, round, reactive to light and accommodation. No scleral icterus. Extraocular muscles intact.  HEENT: Head atraumatic, normocephalic. Oropharynx and nasopharynx clear.  NECK:  Supple, no jugular venous distention. No thyroid enlargement, no tenderness.  LUNGS: Decreased breath sound at the bases.  No wheezing. CARDIOVASCULAR: S1, S2 normal. No murmurs, rubs, or gallops.  ABDOMEN: Soft, nontender, nondistended. Bowel sounds present. No organomegaly or mass.  EXTREMITIES: No pedal edema, cyanosis, or clubbing.  Disuse atrophy and deformities present in both lower extremities and both hands have significant deformity due to arthritis. NEUROLOGIC: Cranial nerves II through XII are intact. Muscle strength 3/5 in both upper extremities 0/5 in both lower extremities. Sensation intact. Gait not checked.  PSYCHIATRIC: The patient  is alert and awake.  Confused. SKIN: No obvious rash, lesion, or ulcer.   Physical Exam LABORATORY PANEL:   CBC Recent Labs  Lab 07/24/19 0735  WBC 6.8  HGB 7.6*  HCT 25.2*  PLT 127*   ------------------------------------------------------------------------------------------------------------------  Chemistries  Recent Labs  Lab 07/25/19 0609  NA 156*  K 3.6  CL 122*  CO2 25  GLUCOSE 86  BUN 25*  CREATININE 0.93  CALCIUM 8.5*   ------------------------------------------------------------------------------------------------------------------  Cardiac Enzymes No results for input(s): TROPONINI in the last 168 hours. ------------------------------------------------------------------------------------------------------------------  RADIOLOGY:  No results found.  ASSESSMENT AND PLAN:   Patient is a 63 year old nursing home resident brought in with shortness of breath and hypoxia  *Dysphagia.  Seems to be progressively worsening.  With multiple comorbidities and and bedbound status he would not be a candidate for PEG tube.  Also would likely pull any tube that will be placed with his confusion.  *Community-acquired pneumonia with COPD exacerbation Confirmed by CT scan of the chest without contrast On IV antibiotics. Continue nebulizers and steroids  *Acute on chronic anemia.  Likely has chronic GI losses.  Stool was positive for occult blood. Appreciate GI input. Recommended outpatient work-up due to high risk for endoscopy procedures with pneumonia  *Chronic thrombocytopenia.  Platelets are improving likely due to steroids.  Possible ITP.  Discussed with Dr. Tish Men of oncology. Plan is to taper steroids over the next 3 to 4 weeks.  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  Oncology consulted.  Outpatient follow-up  * Hypothyroidism- cont levothyroxine  *  Chronic diastolic congestive heart failure.  Stable.  * DVT  prophylaxis No Lovenox or heparin due to thrombocytopenia  * Bedbound status at baseline-due to severe arthritis, morbid obesity, schizophrenia-patient is bedbound but can feed himself.  He is a nursing home resident for last 7 years as per his niece.  * RA- cont HCQ  *Hypernatremia secondary to decreased oral intake.  All the records are reviewed and case discussed with Care Management/Social Worker Management plans discussed with the patient, family and they are in agreement.  CODE STATUS: Full  TOTAL TIME TAKING CARE OF THIS PATIENT: 35 minutes.   POSSIBLE D/C IN 1-2 DAYS, DEPENDING ON CLINICAL CONDITION.  Neita Carp M.D on 07/25/2019   Between 7am to 6pm - Pager - [phone]  After 6pm go to [email or web address] - password [email or web address] [facility]  Office  [phone]  CC: Primary care physician; [facility]  Note: This dictation was prepared with Dragon dictation along with smaller phrase technology. Any transcriptional errors that result from this process are unintentional.

## 2019-07-26 LAB — CBC
HCT: 26.2 % — ABNORMAL LOW (ref 39.0–52.0)
Hemoglobin: 7.6 g/dL — ABNORMAL LOW (ref 13.0–17.0)
MCH: 26.5 pg (ref 26.0–34.0)
MCHC: 29 g/dL — ABNORMAL LOW (ref 30.0–36.0)
MCV: 91.3 fL (ref 80.0–100.0)
Platelets: 229 10*3/uL (ref 150–400)
RBC: 2.87 MIL/uL — ABNORMAL LOW (ref 4.22–5.81)
RDW: 22.5 % — ABNORMAL HIGH (ref 11.5–15.5)
WBC: 8.5 10*3/uL (ref 4.0–10.5)
nRBC: 0.9 % — ABNORMAL HIGH (ref 0.0–0.2)

## 2019-07-26 LAB — BASIC METABOLIC PANEL
Anion gap: 8 (ref 5–15)
BUN: 25 mg/dL — ABNORMAL HIGH (ref 8–23)
CO2: 24 mmol/L (ref 22–32)
Calcium: 8.4 mg/dL — ABNORMAL LOW (ref 8.9–10.3)
Chloride: 124 mmol/L — ABNORMAL HIGH (ref 98–111)
Creatinine, Ser: 1.03 mg/dL (ref 0.61–1.24)
GFR calc Af Amer: >60 mL/min (ref >60)
GFR calc non Af Amer: >60 mL/min (ref >60)
Glucose, Bld: 89 mg/dL (ref 70–99)
Potassium: 3.3 mmol/L — ABNORMAL LOW (ref 3.5–5.1)
Sodium: 156 mmol/L — ABNORMAL HIGH (ref 135–145)

## 2019-07-26 MED ORDER — FLUPHENAZINE HCL 5 MG PO TABS
15.0000 mg | ORAL_TABLET | Freq: Every day | ORAL | Status: DC
  Administered 2019-07-27 – 2019-07-28 (×2): 15 mg via ORAL
  Filled 2019-07-26 (×2): qty 3

## 2019-07-26 MED ORDER — LEVOTHYROXINE SODIUM 50 MCG PO TABS
50.0000 ug | ORAL_TABLET | Freq: Every day | ORAL | Status: DC
  Administered 2019-07-27 – 2019-07-28 (×2): 50 ug via ORAL
  Filled 2019-07-26 (×2): qty 1

## 2019-07-26 NOTE — Progress Notes (Signed)
[place] at [place] NAME: Jerry [name]    MR#:  [id number]  DATE OF BIRTH:  07-20-1956  SUBJECTIVE:   patient confused  Aspiration risk   REVIEW OF SYSTEMS:    Unable to obtain confused  Tolerating Diet: Minimal oral/fluid intake     DRUG ALLERGIES:   Allergies  Allergen Reactions  . Methadone   . Penicillins     Tolerates cephalosporins   . Propoxyphene   . Sulfa Antibiotics   . Valproic Acid And Related     Thrombocytopenia    VITALS:  Blood pressure (!) 141/65, pulse 97, temperature 97.7 F (36.5 C), resp. rate 20, height 5\' 8"  (1.727 m), weight (!) 153.2 kg, SpO2 100 %.  PHYSICAL EXAMINATION:  Constitutional: Appears well-developed and well-nourished. No distress. HENT: Normocephalic. Marland Kitchen Oropharynx is clear and moist.  Eyes: Conjunctivae and EOM are normal. PERRLA, no scleral icterus.  Neck: Normal ROM. Neck supple. No JVD. No tracheal deviation. CVS: RRR, S1/S2 +, no murmurs, no gallops, no carotid bruit.  Pulmonary: Effort and breath sounds normal, no stridor, rhonchi, wheezes, rales.  Abdominal: Soft. BS +,  no distension, tenderness, rebound or guarding.  Musculoskeletal: Normal range of motion. No edema and no tenderness.  Neuro: Alert. CN 2-12 grossly intact. No focal deficits. Skin: Skin is warm and dry. Psychiatric: confused.      LABORATORY PANEL:   CBC Recent Labs  Lab 07/26/19 0704  WBC 8.5  HGB 7.6*  HCT 26.2*  PLT 229   ------------------------------------------------------------------------------------------------------------------  Chemistries  Recent Labs  Lab 07/26/19 0704  NA 156*  K 3.3*  CL 124*  CO2 24  GLUCOSE 89  BUN 25*  CREATININE 1.03  CALCIUM 8.4*   ------------------------------------------------------------------------------------------------------------------  Cardiac Enzymes No results for input(s): TROPONINI in the last 168  hours. ------------------------------------------------------------------------------------------------------------------  RADIOLOGY:  No results found.   ASSESSMENT AND PLAN:    63 year old male with history of chronic diastolic heart failure and schizophrenia who presented to the emergency room from nursing home due to shortness of breath.  1.  Progressive dysphagia: Patient's family has decided against PEG tube aggressive measures.  Fluids have been discontinued as per request of family. Family deciding on comfort care/hospice  High aspiration risk  2.  Electrolyte abnormalities due to aspiration risk with poor p.o. intake  3.  Acute on chronic anemia with chronic GI losses: No further work-up at this time  4.  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  Hypothyroidism: Continue Synthroid   6.  Chronic diastolic heart failure without signs of exacerbation and is stable  7.  Schizophrenia: Management as per psychiatry  8.  Bedbound status at baseline with severe arthritis:   Outpatient PC consult at discharge.   CODE STATUS: dnr  TOTAL TIME TAKING CARE OF THIS PATIENT: 25 minutes.     POSSIBLE D/C ??, DEPENDING ON CLINICAL CONDITION.   Bettey Costa M.D on 07/26/2019 at 10:57 AM  Between 7am to 6pm - Pager - [phone] After 6pm go to [email or web address] - password [email or web address] [facility]  Office  [phone]  CC: Primary care physician; Housecalls, [facility]  Note: This dictation was prepared with Dragon dictation along with smaller phrase technology. Any transcriptional errors that result from this process are unintentional.

## 2019-07-26 NOTE — Consult Note (Signed)
[facility] Face-to-Face Psychiatry Consult   Reason for Consult:  Schizophrenia Referring Physician:  Dr Kenton Kingfisher Patient Identification: Jerry [name] MRN:  [id number] Principal Diagnosis: Pneumonia Diagnosis:  Active Problems:   Schizophrenia ([place])   Pneumonia   Community acquired pneumonia   DNR (do not resuscitate) discussion  Total Time spent with patient: 1 hour  Subjective:   Jerry [name] is a 63 y.o. male patient admitted with pneumonia.  "I need to go pay him a trillion dollars.  I got to go given him the money."  Patient seen and evaluated by this provider in person.  Yesterday, his speech was incomprehensible.  Today, some words and sentences can be deciphered.  He was smiling on entering the room and wanted me to sit and talk on his left side.  Pleasantly disorganized, rambling about paying a man.  Then he began talking to the man that was not there.  Pleasantly confused, will adjust medications, see below.  TSH level ordered yesterday and result was low, synthroid increased.  HPI:  63 yo male admitted with medical issues, history of chronic schizophrenia.  Taking Prolixin and Cogentin.  Most days he is incomprehensible from the notes and appearing to be hallucinating at times.  Lives at [facility] facility related to his sever rheumatoid arthritis and other medical issues, see below.    Past Psychiatric History: schizophrenia  Risk to Self:  none Risk to Others:  none Prior Inpatient Therapy:  yes Prior Outpatient Therapy:  yes  Past Medical History:  Past Medical History:  Diagnosis Date  . BPH (benign prostatic hyperplasia)   . CHF (congestive heart failure) ([place])   . Chronic pain   . COPD (chronic obstructive pulmonary disease) ([place])   . Hypertension   . Morbid obesity ([place])   . Pressure ulcer   . Rheumatoid arthritis ([place])   . Schizophrenia ([place])   . Thyroid disease    History reviewed. No pertinent surgical history. Family History: History reviewed. No pertinent  family history. Family Psychiatric  History: none Social History:  Social History   Substance and Sexual Activity  Alcohol Use No     Social History   Substance and Sexual Activity  Drug Use No    Social History   Socioeconomic History  . Marital status: Single    Spouse name: Not on file  . Number of children: Not on file  . Years of education: Not on file  . Highest education level: Not on file  Occupational History  . Not on file  Social Needs  . Financial resource strain: Not on file  . Food insecurity    Worry: Not on file    Inability: Not on file  . Transportation needs    Medical: Not on file    Non-medical: Not on file  Tobacco Use  . Smoking status: Never Smoker  . Smokeless tobacco: Never Used  Substance and Sexual Activity  . Alcohol use: No  . Drug use: No  . Sexual activity: Not on file  Lifestyle  . Physical activity    Days per week: Not on file    Minutes per session: Not on file  . Stress: Not on file  Relationships  . Social Herbalist on phone: Not on file    Gets together: Not on file    Attends religious service: Not on file    Active member of club or organization: Not on file    Attends meetings of clubs or organizations:  Not on file    Relationship status: Not on file  Other Topics Concern  . Not on file  Social History Narrative  . Not on file   Additional Social History:    Allergies:   Allergies  Allergen Reactions  . Methadone   . Penicillins     Tolerates cephalosporins   . Propoxyphene   . Sulfa Antibiotics   . Valproic Acid And Related     Thrombocytopenia    Labs:  Results for orders placed or performed during the hospital encounter of 07/16/19 (from the past 48 hour(s))  Basic metabolic panel     Status: Abnormal   Collection Time: 07/25/19  6:09 AM  Result Value Ref Range   Sodium 156 (H) 135 - 145 mmol/L   Potassium 3.6 3.5 - 5.1 mmol/L   Chloride 122 (H) 98 - 111 mmol/L   CO2 25 22 - 32  mmol/L   Glucose, Bld 86 70 - 99 mg/dL   BUN 25 (H) 8 - 23 mg/dL   Creatinine, Ser 0.93 0.61 - 1.24 mg/dL   Calcium 8.5 (L) 8.9 - 10.3 mg/dL   GFR calc non Af Amer >60 >60 mL/min   GFR calc Af Amer >60 >60 mL/min   Anion gap 9 5 - 15    Comment: Performed at [facility], [address] [place], [place] [address]  TSH     Status: Abnormal   Collection Time: 07/25/19  6:09 AM  Result Value Ref Range   TSH 0.250 (L) 0.350 - 4.500 uIU/mL    Comment: Performed by a 3rd Generation assay with a functional sensitivity of <=0.01 uIU/mL. Performed at [facility], [address] [place], [place] [address]   CBC     Status: Abnormal   Collection Time: 07/26/19  7:04 AM  Result Value Ref Range   WBC 8.5 4.0 - 10.5 K/uL   RBC 2.87 (L) 4.22 - 5.81 MIL/uL   Hemoglobin 7.6 (L) 13.0 - 17.0 g/dL   HCT 26.2 (L) 39.0 - 52.0 %   MCV 91.3 80.0 - 100.0 fL   MCH 26.5 26.0 - 34.0 pg   MCHC 29.0 (L) 30.0 - 36.0 g/dL   RDW 22.5 (H) 11.5 - 15.5 %   Platelets 229 150 - 400 K/uL   nRBC 0.9 (H) 0.0 - 0.2 %    Comment: Performed at [facility], [address] [place], [place] [id number]  Basic metabolic panel     Status: Abnormal   Collection Time: 07/26/19  7:04 AM  Result Value Ref Range   Sodium 156 (H) 135 - 145 mmol/L   Potassium 3.3 (L) 3.5 - 5.1 mmol/L   Chloride 124 (H) 98 - 111 mmol/L   CO2 24 22 - 32 mmol/L   Glucose, Bld 89 70 - 99 mg/dL   BUN 25 (H) 8 - 23 mg/dL   Creatinine, Ser 1.03 0.61 - 1.24 mg/dL   Calcium 8.4 (L) 8.9 - 10.3 mg/dL   GFR calc non Af Amer >60 >60 mL/min   GFR calc Af Amer >60 >60 mL/min   Anion gap 8 5 - 15    Comment: Performed at [facility], [address] [place], [place] [address]    Current Facility-Administered Medications  Medication Dose Route Frequency Provider Last Rate Last Dose  . 0.9 %  sodium chloride infusion  250 mL Intravenous PRN Dustin Flock, MD   Stopped at 07/23/19 [phone]  . acetaminophen (TYLENOL)  tablet 650 mg  650 mg Oral Q6H PRN Dustin Flock, MD   650 mg at 07/24/19 2326   Or  . acetaminophen (TYLENOL) suppository 650 mg  650 mg Rectal Q6H PRN Dustin Flock, MD      . albuterol (PROVENTIL) (2.5 MG/3ML) 0.083% nebulizer solution 2.5 mg  2.5 mg Inhalation Q6H PRN Vaughan Basta, MD   2.5 mg at 07/21/19 0114  . benztropine (COGENTIN) tablet 0.5 mg  0.5 mg Oral BID Vaughan Basta, MD   0.5 mg at 07/25/19 2047  . docusate (COLACE) 50 MG/5ML liquid 100 mg  100 mg Oral BID Lu Duffel, RPH   100 mg at 07/25/19 2047  . enoxaparin (LOVENOX) injection 40 mg  40 mg Subcutaneous Q12H Hillary Bow, MD   40 mg at 07/25/19 2047  . fluPHENAZine (PROLIXIN) tablet 15 mg  15 mg Oral QHS Vaughan Basta, MD   15 mg at 07/25/19 2046  . [START ON 07/27/2019] fluPHENAZine (PROLIXIN) tablet 15 mg  15 mg Oral Daily Lord, Jamison Y, NP      . glycopyrrolate (ROBINUL) injection 0.2 mg  0.2 mg Intravenous QID PRN [place] Coon, MD   0.2 mg at 07/21/19 1226  . hydrocortisone (CORTEF) tablet 10 mg  10 mg Oral BID Vaughan Basta, MD   10 mg at 07/25/19 2047  . hydroxychloroquine (PLAQUENIL) tablet 200 mg  200 mg Oral BID Vaughan Basta, MD   200 mg at 07/25/19 2048  . levothyroxine (SYNTHROID) tablet 200 mcg  200 mcg Oral QAC breakfast Vaughan Basta, MD   200 mcg at 07/26/19 [id number]  . levothyroxine (SYNTHROID) tablet 25 mcg  25 mcg Oral QAC breakfast Vaughan Basta, MD   25 mcg at 07/26/19 [phone]  . methocarbamol (ROBAXIN) tablet 750 mg  750 mg Oral QHS Vaughan Basta, MD   750 mg at 07/25/19 2047  . metoprolol tartrate (LOPRESSOR) injection 5 mg  5 mg Intravenous Q6H PRN Vaughan Basta, MD   5 mg at 07/22/19 1837  . metoprolol tartrate (LOPRESSOR) tablet 50 mg  50 mg Oral BID Hillary Bow, MD   50 mg at 07/25/19 2051  . mometasone-formoterol (DULERA) 200-5 MCG/ACT inhaler 2 puff  2 puff Inhalation BID Vaughan Basta, MD   2  puff at 07/25/19 2048  . multivitamin with minerals tablet 1 tablet  1 tablet Oral Daily Vaughan Basta, MD   1 tablet at 07/25/19 1632  . ondansetron (ZOFRAN) tablet 4 mg  4 mg Oral Q6H PRN Dustin Flock, MD       Or  . ondansetron (ZOFRAN) injection 4 mg  4 mg Intravenous Q6H PRN Dustin Flock, MD      . pantoprazole (PROTONIX) EC tablet 40 mg  40 mg Oral BID AC Vaughan Basta, MD   40 mg at 07/25/19 1632  . potassium chloride (K-DUR) CR tablet 10 mEq  10 mEq Oral Daily Rito Ehrlich A, RPH   10 mEq at 07/25/19 0926  . predniSONE (DELTASONE) tablet 50 mg  50 mg Oral Q breakfast Vaughan Basta, MD   50 mg at 07/25/19 0926  . sodium chloride flush (NS) 0.9 % injection 3 mL  3 mL Intravenous Q12H Dustin Flock, MD   3 mL at 07/25/19 2048  . sodium chloride flush (NS) 0.9 % injection 3 mL  3 mL Intravenous PRN Dustin Flock, MD      . tamsulosin (FLOMAX) capsule 0.4 mg  0.4 mg Oral Daily Vaughan Basta, MD   0.4 mg at 07/25/19 0926  .  vitamin C (ASCORBIC ACID) tablet 500 mg  500 mg Oral BID Vaughan Basta, MD   500 mg at 07/25/19 2049    Musculoskeletal: Strength & Muscle Tone: within normal limits Gait & Station: normal Patient leans: N/A  Psychiatric Specialty Exam: Physical Exam  Nursing note and vitals reviewed. Constitutional: He appears well-developed and well-nourished.  HENT:  Head: Normocephalic.  Neck: Normal range of motion.  Respiratory: Effort normal.  Neurological: He is alert.  Psychiatric: Judgment and thought content normal. His mood appears anxious. His affect is labile. His speech is tangential. He is actively hallucinating. Cognition and memory are impaired. He is inattentive.    Review of Systems  Constitutional: Positive for malaise/fatigue.  Psychiatric/Behavioral: Positive for hallucinations and memory loss. The patient is nervous/anxious.   All other systems reviewed and are negative.   Blood pressure (!)  141/65, pulse 97, temperature 97.7 F (36.5 C), resp. rate 20, height 5\' 8"  (1.727 m), weight (!) 153.2 kg, SpO2 100 %.Body mass index is 51.35 kg/m.  General Appearance: Casual  Eye Contact:  Good  Speech:  Normal Rate and Slurred at times  Volume:  Normal  Mood:  Anxious  Affect:  Labile  Thought Process:  Disorganized and Descriptions of Associations: Tangential  Orientation:  Other:  person  Thought Content:  Hallucinations: Auditory Visual  Suicidal Thoughts:  No  Homicidal Thoughts:  No  Memory:  Immediate;   Poor Recent;   Poor Remote;   Poor  Judgement:  Impaired  Insight:  Fair  Psychomotor Activity:  Normal  Concentration:  Concentration: Poor and Attention Span: Poor  Recall:  Poor  Fund of Knowledge:  Fair  Language:  Fair  Akathisia:  No  Handed:  Right  AIMS (if indicated):     Assets:  Housing Resilience  ADL's:  Impaired  Cognition:  Impaired,  Moderate  Sleep:        Treatment Plan Summary: Daily contact with patient to assess and evaluate symptoms and progress in treatment, Medication management and Plan Schizophrenia, unspecified type:  -Increased Prolixin 12.5 mg in the am to 15 mg, continue Prolixin 15 at bedtime  EPS: -Continue Cogentin 0.5 mg BID  Hypothyroidism: -Increase synthroid 225 mcg daily to 250 mcg daily -TSH ordered, 8/28 result of 0.25  Disposition: No evidence of imminent risk to self or others at present.   Supportive therapy provided about ongoing stressors.  Waylan Boga, NP 07/26/2019 10:52 AM

## 2019-07-27 ENCOUNTER — Other Ambulatory Visit: Payer: Self-pay

## 2019-07-27 NOTE — Consult Note (Signed)
[facility] Face-to-Face Psychiatry Consult   Reason for Consult:  Schizophrenia Referring Physician:  Dr Kenton Kingfisher Patient Identification: Jerry [name] MRN:  [id number] Principal Diagnosis: Pneumonia Diagnosis:  Active Problems:   Schizophrenia ([place])   Pneumonia   Community acquired pneumonia   DNR (do not resuscitate) discussion  Total Time spent with patient: 30 minutes  Subjective:   Jerry [name] is a 63 y.o. male patient admitted with pneumonia.  Pleasantly talking to someone not in his room, difficult to understand what he says related to slurred speech.  Patient seen and evaluated by this provider in person.  Yesterday, his speech was clearer.  Today, most of it is incomprehensible.   He was smiling and pleasantly talking to no one in the room, disorganized.  Increased his Prolixin am dose from 12.5 mg to 15 mg and his synthroid based on the TSH level from 225 mcg to 250 mcg yesterday, will continue to monitor.  07/26/19 Patient seen and evaluated by this provider in person.  Yesterday, his speech was incomprehensible.  Today, some words and sentences can be deciphered.  He was smiling on entering the room and wanted me to sit and talk on his left side.  Pleasantly disorganized, rambling about paying a man.  Then he began talking to the man that was not there.  Pleasantly confused, will adjust medications, see below.  TSH level ordered yesterday and result was low, synthroid increased.  HPI:  63 yo male admitted with medical issues, history of chronic schizophrenia.  Taking Prolixin and Cogentin.  Most days he is incomprehensible from the notes and appearing to be hallucinating at times.  Lives at [facility] facility related to his sever rheumatoid arthritis and other medical issues, see below.    Past Psychiatric History: schizophrenia  Risk to Self:  none Risk to Others:  none Prior Inpatient Therapy:  yes Prior Outpatient Therapy:  yes  Past Medical History:  Past Medical History:   Diagnosis Date  . BPH (benign prostatic hyperplasia)   . CHF (congestive heart failure) ([place])   . Chronic pain   . COPD (chronic obstructive pulmonary disease) ([place])   . Hypertension   . Morbid obesity ([place])   . Pressure ulcer   . Rheumatoid arthritis ([place])   . Schizophrenia ([place])   . Thyroid disease    History reviewed. No pertinent surgical history. Family History: History reviewed. No pertinent family history. Family Psychiatric  History: none Social History:  Social History   Substance and Sexual Activity  Alcohol Use No     Social History   Substance and Sexual Activity  Drug Use No    Social History   Socioeconomic History  . Marital status: Single    Spouse name: Not on file  . Number of children: Not on file  . Years of education: Not on file  . Highest education level: Not on file  Occupational History  . Not on file  Social Needs  . Financial resource strain: Not on file  . Food insecurity    Worry: Not on file    Inability: Not on file  . Transportation needs    Medical: Not on file    Non-medical: Not on file  Tobacco Use  . Smoking status: Never Smoker  . Smokeless tobacco: Never Used  Substance and Sexual Activity  . Alcohol use: No  . Drug use: No  . Sexual activity: Not on file  Lifestyle  . Physical activity    Days per week: Not  on file    Minutes per session: Not on file  . Stress: Not on file  Relationships  . Social Herbalist on phone: Not on file    Gets together: Not on file    Attends religious service: Not on file    Active member of club or organization: Not on file    Attends meetings of clubs or organizations: Not on file    Relationship status: Not on file  Other Topics Concern  . Not on file  Social History Narrative  . Not on file   Additional Social History:    Allergies:   Allergies  Allergen Reactions  . Methadone   . Penicillins     Tolerates cephalosporins   . Propoxyphene   . Sulfa  Antibiotics   . Valproic Acid And Related     Thrombocytopenia    Labs:  Results for orders placed or performed during the hospital encounter of 07/16/19 (from the past 48 hour(s))  CBC     Status: Abnormal   Collection Time: 07/26/19  7:04 AM  Result Value Ref Range   WBC 8.5 4.0 - 10.5 K/uL   RBC 2.87 (L) 4.22 - 5.81 MIL/uL   Hemoglobin 7.6 (L) 13.0 - 17.0 g/dL   HCT 26.2 (L) 39.0 - 52.0 %   MCV 91.3 80.0 - 100.0 fL   MCH 26.5 26.0 - 34.0 pg   MCHC 29.0 (L) 30.0 - 36.0 g/dL   RDW 22.5 (H) 11.5 - 15.5 %   Platelets 229 150 - 400 K/uL   nRBC 0.9 (H) 0.0 - 0.2 %    Comment: Performed at [facility], [address] [place], [place] [id number]  Basic metabolic panel     Status: Abnormal   Collection Time: 07/26/19  7:04 AM  Result Value Ref Range   Sodium 156 (H) 135 - 145 mmol/L   Potassium 3.3 (L) 3.5 - 5.1 mmol/L   Chloride 124 (H) 98 - 111 mmol/L   CO2 24 22 - 32 mmol/L   Glucose, Bld 89 70 - 99 mg/dL   BUN 25 (H) 8 - 23 mg/dL   Creatinine, Ser 1.03 0.61 - 1.24 mg/dL   Calcium 8.4 (L) 8.9 - 10.3 mg/dL   GFR calc non Af Amer >60 >60 mL/min   GFR calc Af Amer >60 >60 mL/min   Anion gap 8 5 - 15    Comment: Performed at [facility], [address] [place], [place] [address]    Current Facility-Administered Medications  Medication Dose Route Frequency Provider Last Rate Last Dose  . acetaminophen (TYLENOL) tablet 650 mg  650 mg Oral Q6H PRN Dustin Flock, MD   650 mg at 07/24/19 2326   Or  . acetaminophen (TYLENOL) suppository 650 mg  650 mg Rectal Q6H PRN Dustin Flock, MD      . albuterol (PROVENTIL) (2.5 MG/3ML) 0.083% nebulizer solution 2.5 mg  2.5 mg Inhalation Q6H PRN Vaughan Basta, MD   2.5 mg at 07/21/19 0114  . benztropine (COGENTIN) tablet 0.5 mg  0.5 mg Oral BID Vaughan Basta, MD   0.5 mg at 07/27/19 1040  . docusate (COLACE) 50 MG/5ML liquid 100 mg  100 mg Oral BID Lu Duffel, RPH   100 mg at 07/27/19 1040   . enoxaparin (LOVENOX) injection 40 mg  40 mg Subcutaneous Q12H Hillary Bow, MD   40 mg at 07/27/19 1039  . fluPHENAZine (PROLIXIN) tablet 15 mg  15 mg  Oral QHS Vaughan Basta, MD   15 mg at 07/26/19 2153  . fluPHENAZine (PROLIXIN) tablet 15 mg  15 mg Oral Daily Patrecia Pour, NP   15 mg at 07/27/19 1039  . glycopyrrolate (ROBINUL) injection 0.2 mg  0.2 mg Intravenous QID PRN [place] Coon, MD   0.2 mg at 07/21/19 1226  . hydrocortisone (CORTEF) tablet 10 mg  10 mg Oral BID Vaughan Basta, MD   10 mg at 07/27/19 1041  . hydroxychloroquine (PLAQUENIL) tablet 200 mg  200 mg Oral BID Vaughan Basta, MD   200 mg at 07/27/19 1039  . levothyroxine (SYNTHROID) tablet 200 mcg  200 mcg Oral QAC breakfast Patrecia Pour, NP   200 mcg at 07/27/19 [phone]  . levothyroxine (SYNTHROID) tablet 50 mcg  50 mcg Oral QAC breakfast Patrecia Pour, NP   50 mcg at 07/27/19 [phone]  . methocarbamol (ROBAXIN) tablet 750 mg  750 mg Oral QHS Vaughan Basta, MD   750 mg at 07/26/19 2155  . metoprolol tartrate (LOPRESSOR) injection 5 mg  5 mg Intravenous Q6H PRN Vaughan Basta, MD   5 mg at 07/22/19 1837  . metoprolol tartrate (LOPRESSOR) tablet 50 mg  50 mg Oral BID Hillary Bow, MD   50 mg at 07/27/19 1047  . mometasone-formoterol (DULERA) 200-5 MCG/ACT inhaler 2 puff  2 puff Inhalation BID Vaughan Basta, MD   2 puff at 07/27/19 1041  . multivitamin with minerals tablet 1 tablet  1 tablet Oral Daily Vaughan Basta, MD   1 tablet at 07/27/19 1040  . ondansetron (ZOFRAN) tablet 4 mg  4 mg Oral Q6H PRN Dustin Flock, MD       Or  . ondansetron (ZOFRAN) injection 4 mg  4 mg Intravenous Q6H PRN Dustin Flock, MD      . pantoprazole (PROTONIX) EC tablet 40 mg  40 mg Oral BID AC Vaughan Basta, MD   40 mg at 07/27/19 1040  . potassium chloride (K-DUR) CR tablet 10 mEq  10 mEq Oral Daily Rito Ehrlich A, RPH   10 mEq at 07/27/19 1040  . predniSONE (DELTASONE)  tablet 50 mg  50 mg Oral Q breakfast Vaughan Basta, MD   50 mg at 07/27/19 1040  . tamsulosin (FLOMAX) capsule 0.4 mg  0.4 mg Oral Daily Vaughan Basta, MD   0.4 mg at 07/27/19 1040  . vitamin C (ASCORBIC ACID) tablet 500 mg  500 mg Oral BID Vaughan Basta, MD   500 mg at 07/27/19 1040    Musculoskeletal: Strength & Muscle Tone: within normal limits Gait & Station: normal Patient leans: N/A  Psychiatric Specialty Exam: Physical Exam  Nursing note and vitals reviewed. Constitutional: He appears well-developed and well-nourished.  HENT:  Head: Normocephalic.  Neck: Normal range of motion.  Respiratory: Effort normal.  Neurological: He is alert.  Psychiatric: Judgment and thought content normal. His affect is labile. His speech is tangential. He is actively hallucinating. Cognition and memory are impaired. He is inattentive.    Review of Systems  Constitutional: Positive for malaise/fatigue.  Psychiatric/Behavioral: Positive for hallucinations and memory loss.  All other systems reviewed and are negative.   Blood pressure (!) 148/80, pulse 72, temperature 99.6 F (37.6 C), temperature source Oral, resp. rate 18, height 5\' 8"  (1.727 m), weight (!) 153.2 kg, SpO2 99 %.Body mass index is 51.35 kg/m.  General Appearance: Casual  Eye Contact:  Good  Speech:  Normal Rate and Slurred at times  Volume:  Normal  Mood:  Euthymic  Affect:  Labile  Thought Process:  Disorganized and Descriptions of Associations: Tangential  Orientation:  Other:  person  Thought Content:  Hallucinations: Auditory Visual  Suicidal Thoughts:  No  Homicidal Thoughts:  No  Memory:  Immediate;   Poor Recent;   Poor Remote;   Poor  Judgement:  Impaired  Insight:  Fair  Psychomotor Activity:  Normal  Concentration:  Concentration: Poor and Attention Span: Poor  Recall:  Poor  Fund of Knowledge:  Fair  Language:  Fair  Akathisia:  No  Handed:  Right  AIMS (if indicated):      Assets:  Housing Resilience  ADL's:  Impaired  Cognition:  Impaired,  Moderate  Sleep:        Treatment Plan Summary: Daily contact with patient to assess and evaluate symptoms and progress in treatment, Medication management and Plan Schizophrenia, unspecified type:  -Continue Prolixin 15 BID  EPS: -Continue Cogentin 0.5 mg BID  Hypothyroidism: -Continue Synthroid 250 mcg, increased on 8/29 based on TSH -TSH ordered, 8/28 result of 0.25  Disposition: No evidence of imminent risk to self or others at present.   Supportive therapy provided about ongoing stressors.  Waylan Boga, NP 07/27/2019 3:26 PM

## 2019-07-27 NOTE — Plan of Care (Signed)
Since patient has baseline of AMS - L/M for [name] (reportedly patient's Guardian (per patient's sister.)  Told [name] we needed to set up password so we could give info over the phone. Also informed sister we need [name] to set up passwords, before we could give out information.  Waiting on [name] to return call.

## 2019-07-27 NOTE — Progress Notes (Signed)
[facility] [place] NAME: Jerry [name]    MR#:  [id number]  DATE OF BIRTH:  Apr 18, 1956  SUBJECTIVE:   patient remains confused.   REVIEW OF SYSTEMS:    Unable to obtain confused  Tolerating Diet: Minimal oral/fluid intake     DRUG ALLERGIES:   Allergies  Allergen Reactions  . Methadone   . Penicillins     Tolerates cephalosporins   . Propoxyphene   . Sulfa Antibiotics   . Valproic Acid And Related     Thrombocytopenia    VITALS:  Blood pressure 137/73, pulse 67, temperature 99.6 F (37.6 C), temperature source Oral, resp. rate 18, height 5\' 8"  (1.727 m), weight (!) 153.2 kg, SpO2 99 %.  PHYSICAL EXAMINATION:  Constitutional: Appears chronically ill-appearing no distress. HENT: Normocephalic. Marland Kitchen Oropharynx is clear and moist.  Eyes: Conjunctivae and EOM are normal. PERRLA, no scleral icterus.  Neck: Normal ROM. Neck supple. No JVD. No tracheal deviation. CVS: RRR, S1/S2 +, no murmurs, no gallops, no carotid bruit.  Pulmonary: Effort and breath sounds normal, no stridor, rhonchi, wheezes, rales.  Abdominal: Soft. BS +,  no distension, tenderness, rebound or guarding.  Musculoskeletal: Normal range of motion. No edema and no tenderness.  Neuro: Alert.  Confused   skin: Skin is warm and dry. Psychiatric: confused.      LABORATORY PANEL:   CBC Recent Labs  Lab 07/26/19 0704  WBC 8.5  HGB 7.6*  HCT 26.2*  PLT 229   ------------------------------------------------------------------------------------------------------------------  Chemistries  Recent Labs  Lab 07/26/19 0704  NA 156*  K 3.3*  CL 124*  CO2 24  GLUCOSE 89  BUN 25*  CREATININE 1.03  CALCIUM 8.4*   ------------------------------------------------------------------------------------------------------------------  Cardiac Enzymes No results for input(s): TROPONINI in the last 168  hours. ------------------------------------------------------------------------------------------------------------------  RADIOLOGY:  No results found.   ASSESSMENT AND PLAN:    63 year old male with history of chronic diastolic heart failure and schizophrenia who presented to the emergency room from nursing home due to shortness of breath.  1.  Progressive dysphagia: Patient's family has decided against PEG tube aggressive measures.  Fluids have been discontinued as per request of family. I have spoken with patient's niece who is power of attorney.  Patient will be discharged to skilled nursing facility with palliative care services. She is well aware of his high aspiration risk.   2.  Electrolyte abnormalities due to aspiration risk with poor p.o. intake  3.  Acute on chronic anemia with chronic GI losses: No further work-up at this time  4.  Chronic thrombocytopenia with ITP: He will continue with oral steroids 5.  Hypothyroidism: Continue Synthroid   6.  Chronic diastolic heart failure without signs of exacerbation and is stable  7.  Schizophrenia: Management as per psychiatry  8.  Bedbound status at baseline with severe arthritis:   Outpatient PC consult at discharge.   CODE STATUS: dnr  TOTAL TIME TAKING CARE OF THIS PATIENT: 35 minutes.   Discussed with patient's niece We need to order cover test before patient may return back to rehab.  This test was ordered today. He is ready for discharge once his test is available to [place].   POSSIBLE D/C tomorrow, DEPENDING ON CLINICAL CONDITION.   Bettey Costa M.D on 07/27/2019 at 10:08 AM  Between 7am to 6pm - Pager - [phone] After 6pm go to [email or web address] - password [email or web address] [facility]  Office  [phone]  CC: Primary care physician;  [facility]  Note: This dictation was prepared with Dragon dictation along with smaller phrase technology. Any transcriptional errors that result  from this process are unintentional.

## 2019-07-28 DIAGNOSIS — F203 Undifferentiated schizophrenia: Secondary | ICD-10-CM

## 2019-07-28 LAB — BASIC METABOLIC PANEL
Anion gap: 6 (ref 5–15)
BUN: 27 mg/dL — ABNORMAL HIGH (ref 8–23)
CO2: 26 mmol/L (ref 22–32)
Calcium: 8.1 mg/dL — ABNORMAL LOW (ref 8.9–10.3)
Chloride: 127 mmol/L — ABNORMAL HIGH (ref 98–111)
Creatinine, Ser: 1 mg/dL (ref 0.61–1.24)
GFR calc Af Amer: >60 mL/min (ref >60)
GFR calc non Af Amer: >60 mL/min (ref >60)
Glucose, Bld: 89 mg/dL (ref 70–99)
Potassium: 3.1 mmol/L — ABNORMAL LOW (ref 3.5–5.1)
Sodium: 159 mmol/L — ABNORMAL HIGH (ref 135–145)

## 2019-07-28 LAB — NOVEL CORONAVIRUS, NAA (HOSP ORDER, SEND-OUT TO REF LAB; TAT 18-24 HRS): SARS-CoV-2, NAA: NOT DETECTED

## 2019-07-28 MED ORDER — FLUPHENAZINE HCL 5 MG PO TABS: 15.0000 mg | ORAL_TABLET | Freq: Every day | ORAL | 0 refills | Status: DC

## 2019-07-28 MED ORDER — METOPROLOL TARTRATE 50 MG PO TABS: 50.0000 mg | ORAL_TABLET | Freq: Two times a day (BID) | ORAL | Status: AC

## 2019-07-28 MED ORDER — LEVOTHYROXINE SODIUM 50 MCG PO TABS: 50.0000 ug | ORAL_TABLET | Freq: Every day | ORAL | 0 refills | Status: DC

## 2019-07-28 MED ORDER — PREDNISONE 10 MG PO TABS: 30.0000 mg | ORAL_TABLET | Freq: Every day | ORAL | 0 refills | Status: DC

## 2019-07-28 NOTE — TOC Transition Note (Signed)
Transition of Care [facility]) - CM/SW Discharge Note   Patient Details  Name: Jerry [name] MRN: [id number] Date of Birth: 1956-09-12  Transition of Care [facility]) CM/SW Contact:  Wes Lezotte, Lenice Llamas Phone Number: [phone]  07/28/2019, 3:14 PM   Clinical Narrative: Patient is medically stable for D/C back to [facility] today with outpatient palliative. [facility] liaison is aware of above. Patient's last covid test was negative on 8/30. Per Otila Kluver [facility] liaison patient can come back today to room 604. RN will call report and arrange EMS for transport. Clinical Education officer, museum (CSW) sent D/C orders to [facility] via [facility]. CSW contacted patient's niece [name] and made her aware of above. Please reconsult if future social work needs arise. CSW signing off.       Final next level of care: Skilled Nursing Facility Barriers to Discharge: Barriers Resolved   Patient Goals and CMS Choice        Discharge Placement   Existing PASRR number confirmed : 07/17/19          Patient chooses bed at: [facility] [place] Patient to be transferred to facility by: [facility] Name of family member notified: Patient's niece Scarlette Ar is aware of D/C today. Patient and family notified of of transfer: 07/28/19  Discharge Plan and Services In-house Referral: Clinical Social Work Discharge Planning Services: CM Consult            DME Arranged: N/A         HH Arranged: NA          Social Determinants of Health (SDOH) Interventions     Readmission Risk Interventions No flowsheet data found.

## 2019-07-28 NOTE — Discharge Summary (Signed)
[place] [facility] [place] NAME: Jerry [name]    MR#:  [id number]  DATE OF BIRTH:  03/30/1956  DATE OF ADMISSION:  07/16/2019 ADMITTING PHYSICIAN: Dustin Flock, MD  DATE OF DISCHARGE: July 28, 2019  PRIMARY CARE PHYSICIAN: [facility]    ADMISSION DIAGNOSIS:  Thrombocytopenia ([place]) [D69.6] AKI (acute kidney injury) ([place]) [N17.9] Occult GI bleeding [R19.5] Community acquired pneumonia, unspecified laterality [J18.9]  DISCHARGE DIAGNOSIS:  Active Problems:   Pneumonia   Schizophrenia ([place])   Community acquired pneumonia   DNR (do not resuscitate) discussion   SECONDARY DIAGNOSIS:   Past Medical History:  Diagnosis Date  . BPH (benign prostatic hyperplasia)   . CHF (congestive heart failure) ([place])   . Chronic pain   . COPD (chronic obstructive pulmonary disease) ([place])   . Hypertension   . Morbid obesity ([place])   . Pressure ulcer   . Rheumatoid arthritis ([place])   . Schizophrenia ([place])   . Thyroid disease     HOSPITAL COURSE:   63 year old male with history of chronic diastolic heart failure and schizophrenia who presented to the emergency room from nursing home due to shortness of breath.  1.  Progressive dysphagia: Patient's family has decided against PEG tube aggressive measures.  I have spoken with patient's niece who is power of attorney.  Patient will be discharged to skilled nursing facility with palliative care services. She is aware of his high aspiration risk.   2.  Electrolyte abnormalities due to aspiration risk with poor p.o. intake  3.  Acute on chronic anemia with chronic GI losses: No further work-up at this time  4.  Chronic thrombocytopenia with ITP: He was on prednisone but he is alaso on hydrocortisone, so at dischargee he will continue CORTEF. He can follow up with ONCOLOGY outpatient.   5.  Hypothyroidism: continue Synthroid 250 mcg, increased on 8/29 based on TSH Repeat thyroid  function test in 4 weeks.    6.  Chronic diastolic heart failure without signs of exacerbation and is stable  7 Schizophrenia, unspecified type:  He was evaluated by psychiatry while in the hospital.  He shows no imminent risk to himself or others. He will continue Prolixin 15 BID  8 EPS: He will continue Cogentin 0.5 mg BID    Outpatient PC consult at discharge.   DISCHARGE CONDITIONS AND DIET:  Stable Dysphagia 1 diet with aspiration precautions nectar thick NO Straws; add Gravy to meats, potatoes. Yogurt TID meals; Oatmeal at breakfast w/ butter/sugar per Speech. Puddings.  CONSULTS OBTAINED:    DRUG ALLERGIES:   Allergies  Allergen Reactions  . Methadone   . Penicillins     Tolerates cephalosporins   . Propoxyphene   . Sulfa Antibiotics   . Valproic Acid And Related     Thrombocytopenia    DISCHARGE MEDICATIONS:   Allergies as of 07/28/2019      Reactions   Methadone    Penicillins    Tolerates cephalosporins   Propoxyphene    Sulfa Antibiotics    Valproic Acid And Related    Thrombocytopenia      Medication List    STOP taking these medications   clonazePAM 1 MG tablet Commonly known as: KLONOPIN   hydrochlorothiazide 12.5 MG tablet Commonly known as: HYDRODIURIL   potassium chloride 10 MEQ CR capsule Commonly known as: MICRO-K   protein supplement shake Liqd Commonly known as: PREMIER PROTEIN   torsemide 10 MG tablet Commonly known as: DEMADEX  traZODone 100 MG tablet Commonly known as: DESYREL     TAKE these medications   albuterol 108 (90 Base) MCG/ACT inhaler Commonly known as: VENTOLIN HFA Inhale 1 puff into the lungs every 3 (three) hours as needed for wheezing or shortness of breath.   ammonium lactate 12 % lotion Commonly known as: LAC-HYDRIN Apply 1 application topically 2 (two) times daily. Apply topically to bilateral feet   benztropine 0.5 MG tablet Commonly known as: COGENTIN Take 0.5 mg by mouth 2 (two) times  daily.   docusate sodium 100 MG capsule Commonly known as: COLACE Take 100 mg by mouth 2 (two) times daily.   fluPHENAZine 5 MG tablet Commonly known as: PROLIXIN Take 3 tablets (15 mg total) by mouth at bedtime. What changed: You were already taking a medication with the same name, and this prescription was added. Make sure you understand how and when to take each.   fluPHENAZine 5 MG tablet Commonly known as: PROLIXIN Take 3 tablets (15 mg total) by mouth daily. Start taking on: July 29, 2019 What changed:   medication strength  how much to take  additional instructions   hydrocortisone 10 MG tablet Commonly known as: CORTEF Take 1 tablet (10 mg total) by mouth 2 (two) times daily.   hydroxychloroquine 200 MG tablet Commonly known as: PLAQUENIL Take 200 mg by mouth 2 (two) times daily.   levothyroxine 200 MCG tablet Commonly known as: SYNTHROID Take 200 mcg by mouth daily before breakfast. Take along with 25 mcg tablet for total of 225 mcg daily What changed: Another medication with the same name was changed. Make sure you understand how and when to take each.   levothyroxine 50 MCG tablet Commonly known as: SYNTHROID Take 1 tablet (50 mcg total) by mouth daily before breakfast. Start taking on: July 29, 2019 What changed:   medication strength  how much to take  additional instructions   methocarbamol 750 MG tablet Commonly known as: ROBAXIN Take 750 mg by mouth at bedtime.   metoprolol tartrate 50 MG tablet Commonly known as: LOPRESSOR Take 1 tablet (50 mg total) by mouth 2 (two) times daily. What changed:   medication strength  how much to take   MIRALAX PO Take 17 g by mouth daily.   multivitamin with minerals tablet Take 1 tablet by mouth daily.   tamsulosin 0.4 MG Caps capsule Commonly known as: FLOMAX Take 0.4 mg by mouth daily.   vitamin C 250 MG tablet Commonly known as: ASCORBIC ACID Take 500 mg by mouth 2 (two) times  daily.         Today   CHIEF COMPLAINT:  No issues overnight.  Patient still has some confusion however he does know where he is this morning.   VITAL SIGNS:  Blood pressure 118/75, pulse 92, temperature (!) 97.5 F (36.4 C), resp. rate 19, height 5\' 8"  (1.727 m), weight (!) 153.2 kg, SpO2 95 %.   REVIEW OF SYSTEMS:  Review of Systems  Unable to perform ROS: Medical condition     PHYSICAL EXAMINATION:  GENERAL:  63 y.o.-year-old patient lying in the bed with no acute distress.  NECK:  Supple, no jugular venous distention. No thyroid enlargement, no tenderness.  LUNGS: Normal breath sounds bilaterally, no wheezing, rales,rhonchi  No use of accessory muscles of respiration.  CARDIOVASCULAR: S1, S2 normal. No murmurs, rubs, or gallops.  ABDOMEN: Soft, non-tender, non-distended. Bowel sounds present. No organomegaly or mass.  EXTREMITIES: No pedal edema, cyanosis, or clubbing.  PSYCHIATRIC: The patient is alert and oriented x name place not time.  SKIN: No obvious rash, lesion, or ulcer.   DATA REVIEW:   CBC Recent Labs  Lab 07/26/19 0704  WBC 8.5  HGB 7.6*  HCT 26.2*  PLT 229    Chemistries  Recent Labs  Lab 07/28/19 0425  NA 159*  K 3.1*  CL 127*  CO2 26  GLUCOSE 89  BUN 27*  CREATININE 1.00  CALCIUM 8.1*    Cardiac Enzymes No results for input(s): TROPONINI in the last 168 hours.  Microbiology Results  @MICRORSLT48 @  RADIOLOGY:  No results found.    Allergies as of 07/28/2019      Reactions   Methadone    Penicillins    Tolerates cephalosporins   Propoxyphene    Sulfa Antibiotics    Valproic Acid And Related    Thrombocytopenia      Medication List    STOP taking these medications   clonazePAM 1 MG tablet Commonly known as: KLONOPIN   hydrochlorothiazide 12.5 MG tablet Commonly known as: HYDRODIURIL   potassium chloride 10 MEQ CR capsule Commonly known as: MICRO-K   protein supplement shake Liqd Commonly known as: PREMIER  PROTEIN   torsemide 10 MG tablet Commonly known as: DEMADEX   traZODone 100 MG tablet Commonly known as: DESYREL     TAKE these medications   albuterol 108 (90 Base) MCG/ACT inhaler Commonly known as: VENTOLIN HFA Inhale 1 puff into the lungs every 3 (three) hours as needed for wheezing or shortness of breath.   ammonium lactate 12 % lotion Commonly known as: LAC-HYDRIN Apply 1 application topically 2 (two) times daily. Apply topically to bilateral feet   benztropine 0.5 MG tablet Commonly known as: COGENTIN Take 0.5 mg by mouth 2 (two) times daily.   docusate sodium 100 MG capsule Commonly known as: COLACE Take 100 mg by mouth 2 (two) times daily.   fluPHENAZine 5 MG tablet Commonly known as: PROLIXIN Take 3 tablets (15 mg total) by mouth at bedtime. What changed: You were already taking a medication with the same name, and this prescription was added. Make sure you understand how and when to take each.   fluPHENAZine 5 MG tablet Commonly known as: PROLIXIN Take 3 tablets (15 mg total) by mouth daily. Start taking on: July 29, 2019 What changed:   medication strength  how much to take  additional instructions   hydrocortisone 10 MG tablet Commonly known as: CORTEF Take 1 tablet (10 mg total) by mouth 2 (two) times daily.   hydroxychloroquine 200 MG tablet Commonly known as: PLAQUENIL Take 200 mg by mouth 2 (two) times daily.   levothyroxine 200 MCG tablet Commonly known as: SYNTHROID Take 200 mcg by mouth daily before breakfast. Take along with 25 mcg tablet for total of 225 mcg daily What changed: Another medication with the same name was changed. Make sure you understand how and when to take each.   levothyroxine 50 MCG tablet Commonly known as: SYNTHROID Take 1 tablet (50 mcg total) by mouth daily before breakfast. Start taking on: July 29, 2019 What changed:   medication strength  how much to take  additional instructions    methocarbamol 750 MG tablet Commonly known as: ROBAXIN Take 750 mg by mouth at bedtime.   metoprolol tartrate 50 MG tablet Commonly known as: LOPRESSOR Take 1 tablet (50 mg total) by mouth 2 (two) times daily. What changed:   medication strength  how much to take  MIRALAX PO Take 17 g by mouth daily.   multivitamin with minerals tablet Take 1 tablet by mouth daily.   tamsulosin 0.4 MG Caps capsule Commonly known as: FLOMAX Take 0.4 mg by mouth daily.   vitamin C 250 MG tablet Commonly known as: ASCORBIC ACID Take 500 mg by mouth 2 (two) times daily.          Management plans discussed with the patient's POA/niece and she is in agreement. Stable for discharge with outpatient palliative care services  Patient should follow up with PCP  CODE STATUS:     Code Status Orders  (From admission, onward)         Start     Ordered   07/25/19 1637  Do not attempt resuscitation (DNR)  Continuous    Question Answer Comment  In the event of cardiac or respiratory ARREST Do not call a "code blue"   In the event of cardiac or respiratory ARREST Do not perform Intubation, CPR, defibrillation or ACLS   In the event of cardiac or respiratory ARREST Use medication by any route, position, wound care, and other measures to relive pain and suffering. May use oxygen, suction and manual treatment of airway obstruction as needed for comfort.      07/25/19 1636        Code Status History    Date Active Date Inactive Code Status Order ID Comments User Context   07/16/2019 1733 07/25/2019 1636 Full Code [id number]  Dustin Flock, MD ED   12/25/2017 2105 12/30/2017 1646 Full Code [id number]  Vaughan Basta, MD Inpatient   08/02/2017 1447 08/13/2017 1844 Full Code [id number]  Bettey Costa, MD Inpatient   06/21/2017 1527 07/02/2017 1628 Full Code [id number]  Theodoro Grist, MD Inpatient   Advance Care Planning Activity      TOTAL TIME TAKING CARE OF THIS PATIENT: 39 minutes.    Note:  This dictation was prepared with Dragon dictation along with smaller phrase technology. Any transcriptional errors that result from this process are unintentional.  Bettey Costa M.D on 07/28/2019 at 10:33 AM  Between 7am to 6pm - Pager - [phone] After 6pm go to [email or web address] - Proofreader  [facility]  Office  [phone]  CC: Primary care physician; [facility]

## 2019-07-28 NOTE — Consult Note (Signed)
[facility] Face-to-Face Psychiatry Consult   Reason for Consult:  Schizophrenia Referring Physician:  Dr Kenton Kingfisher Patient Identification: Jerry [name] MRN:  [id number] Principal Diagnosis: Pneumonia Diagnosis:  Active Problems:   Schizophrenia ([place])   Pneumonia   Community acquired pneumonia   DNR (do not resuscitate) discussion  Total Time spent with patient: 30 minutes  Subjective:   Jerry [name] is a 63 y.o. male patient admitted with pneumonia.  Pleasantly relaxing on his bed.  Pleasant and easier to understand today.  "I feel a little happy.  I just need some water."    Patient seen and evaluated by this provider in person.  Yesterday, his speech was incomprehensible.  Today, his speech was clear, slow to respond at times.  Pleasant, not responding to internal stimuli.  Stable psychiatrically.  07/27/19: Patient seen and evaluated by this provider in person.  Yesterday, his speech was clearer.  Today, most of it is incomprehensible.   He was smiling and pleasantly talking to no one in the room, disorganized.  Increased his Prolixin am dose from 12.5 mg to 15 mg and his synthroid based on the TSH level from 225 mcg to 250 mcg yesterday, will continue to monitor.  07/26/19 Patient seen and evaluated by this provider in person.  Yesterday, his speech was incomprehensible.  Today, some words and sentences can be deciphered.  He was smiling on entering the room and wanted me to sit and talk on his left side.  Pleasantly disorganized, rambling about paying a man.  Then he began talking to the man that was not there.  Pleasantly confused, will adjust medications, see below.  TSH level ordered yesterday and result was low, synthroid increased.  HPI:  63 yo male admitted with medical issues, history of chronic schizophrenia.  Taking Prolixin and Cogentin.  Most days he is incomprehensible from the notes and appearing to be hallucinating at times.  Lives at [facility] related to his sever  rheumatoid arthritis and other medical issues, see below.    Past Psychiatric History: schizophrenia  Risk to Self:  none Risk to Others:  none Prior Inpatient Therapy:  yes Prior Outpatient Therapy:  yes  Past Medical History:  Past Medical History:  Diagnosis Date  . BPH (benign prostatic hyperplasia)   . CHF (congestive heart failure) ([place])   . Chronic pain   . COPD (chronic obstructive pulmonary disease) ([place])   . Hypertension   . Morbid obesity ([place])   . Pressure ulcer   . Rheumatoid arthritis ([place])   . Schizophrenia ([place])   . Thyroid disease    History reviewed. No pertinent surgical history. Family History: History reviewed. No pertinent family history. Family Psychiatric  History: none Social History:  Social History   Substance and Sexual Activity  Alcohol Use No     Social History   Substance and Sexual Activity  Drug Use No    Social History   Socioeconomic History  . Marital status: Single    Spouse name: Not on file  . Number of children: Not on file  . Years of education: Not on file  . Highest education level: Not on file  Occupational History  . Not on file  Social Needs  . Financial resource strain: Not on file  . Food insecurity    Worry: Not on file    Inability: Not on file  . Transportation needs    Medical: Not on file    Non-medical: Not on file  Tobacco Use  .  Smoking status: Never Smoker  . Smokeless tobacco: Never Used  Substance and Sexual Activity  . Alcohol use: No  . Drug use: No  . Sexual activity: Not on file  Lifestyle  . Physical activity    Days per week: Not on file    Minutes per session: Not on file  . Stress: Not on file  Relationships  . Social Herbalist on phone: Not on file    Gets together: Not on file    Attends religious service: Not on file    Active member of club or organization: Not on file    Attends meetings of clubs or organizations: Not on file    Relationship status: Not on file   Other Topics Concern  . Not on file  Social History Narrative  . Not on file   Additional Social History:    Allergies:   Allergies  Allergen Reactions  . Methadone   . Penicillins     Tolerates cephalosporins   . Propoxyphene   . Sulfa Antibiotics   . Valproic Acid And Related     Thrombocytopenia    Labs:  Results for orders placed or performed during the hospital encounter of 07/16/19 (from the past 48 hour(s))  Basic metabolic panel     Status: Abnormal   Collection Time: 07/28/19  4:25 AM  Result Value Ref Range   Sodium 159 (H) 135 - 145 mmol/L   Potassium 3.1 (L) 3.5 - 5.1 mmol/L   Chloride 127 (H) 98 - 111 mmol/L   CO2 26 22 - 32 mmol/L   Glucose, Bld 89 70 - 99 mg/dL   BUN 27 (H) 8 - 23 mg/dL   Creatinine, Ser 1.00 0.61 - 1.24 mg/dL   Calcium 8.1 (L) 8.9 - 10.3 mg/dL   GFR calc non Af Amer >60 >60 mL/min   GFR calc Af Amer >60 >60 mL/min   Anion gap 6 5 - 15    Comment: Performed at [facility], [address] [place], [place] [address]    Current Facility-Administered Medications  Medication Dose Route Frequency Provider Last Rate Last Dose  . acetaminophen (TYLENOL) tablet 650 mg  650 mg Oral Q6H PRN Dustin Flock, MD   650 mg at 07/24/19 2326   Or  . acetaminophen (TYLENOL) suppository 650 mg  650 mg Rectal Q6H PRN Dustin Flock, MD      . albuterol (PROVENTIL) (2.5 MG/3ML) 0.083% nebulizer solution 2.5 mg  2.5 mg Inhalation Q6H PRN Vaughan Basta, MD   2.5 mg at 07/21/19 0114  . benztropine (COGENTIN) tablet 0.5 mg  0.5 mg Oral BID Vaughan Basta, MD   0.5 mg at 07/28/19 0819  . docusate (COLACE) 50 MG/5ML liquid 100 mg  100 mg Oral BID Lu Duffel, RPH   100 mg at 07/28/19 0820  . enoxaparin (LOVENOX) injection 40 mg  40 mg Subcutaneous Q12H Hillary Bow, MD   40 mg at 07/28/19 0820  . fluPHENAZine (PROLIXIN) tablet 15 mg  15 mg Oral QHS Vaughan Basta, MD   15 mg at 07/27/19 2244  . fluPHENAZine  (PROLIXIN) tablet 15 mg  15 mg Oral Daily Patrecia Pour, NP   15 mg at 07/28/19 0818  . glycopyrrolate (ROBINUL) injection 0.2 mg  0.2 mg Intravenous QID PRN Lance Coon, MD   0.2 mg at 07/21/19 1226  . hydrocortisone (CORTEF) tablet 10 mg  10 mg Oral BID Vaughan Basta, MD   10 mg at  07/28/19 0819  . hydroxychloroquine (PLAQUENIL) tablet 200 mg  200 mg Oral BID Vaughan Basta, MD   200 mg at 07/28/19 0818  . levothyroxine (SYNTHROID) tablet 200 mcg  200 mcg Oral QAC breakfast Patrecia Pour, NP   200 mcg at 07/28/19 [id number]  . levothyroxine (SYNTHROID) tablet 50 mcg  50 mcg Oral QAC breakfast Patrecia Pour, NP   50 mcg at 07/28/19 [id number]  . methocarbamol (ROBAXIN) tablet 750 mg  750 mg Oral QHS Vaughan Basta, MD   750 mg at 07/27/19 2244  . metoprolol tartrate (LOPRESSOR) injection 5 mg  5 mg Intravenous Q6H PRN Vaughan Basta, MD   5 mg at 07/22/19 1837  . metoprolol tartrate (LOPRESSOR) tablet 50 mg  50 mg Oral BID Hillary Bow, MD   50 mg at 07/28/19 0820  . mometasone-formoterol (DULERA) 200-5 MCG/ACT inhaler 2 puff  2 puff Inhalation BID Vaughan Basta, MD   2 puff at 07/28/19 0829  . multivitamin with minerals tablet 1 tablet  1 tablet Oral Daily Vaughan Basta, MD   1 tablet at 07/28/19 0820  . ondansetron (ZOFRAN) tablet 4 mg  4 mg Oral Q6H PRN Dustin Flock, MD       Or  . ondansetron (ZOFRAN) injection 4 mg  4 mg Intravenous Q6H PRN Dustin Flock, MD      . pantoprazole (PROTONIX) EC tablet 40 mg  40 mg Oral BID AC Vaughan Basta, MD   40 mg at 07/28/19 0819  . potassium chloride (K-DUR) CR tablet 10 mEq  10 mEq Oral Daily Rito Ehrlich A, RPH   10 mEq at 07/28/19 [id number]  . predniSONE (DELTASONE) tablet 50 mg  50 mg Oral Q breakfast Vaughan Basta, MD   50 mg at 07/28/19 0820  . tamsulosin (FLOMAX) capsule 0.4 mg  0.4 mg Oral Daily Vaughan Basta, MD   0.4 mg at 07/28/19 0819  . vitamin C (ASCORBIC ACID)  tablet 500 mg  500 mg Oral BID Vaughan Basta, MD   500 mg at 07/28/19 0820    Musculoskeletal: Strength & Muscle Tone: within normal limits Gait & Station: normal Patient leans: N/A  Psychiatric Specialty Exam: Physical Exam  Nursing note and vitals reviewed. Constitutional: He appears well-developed and well-nourished.  HENT:  Head: Normocephalic.  Neck: Normal range of motion.  Respiratory: Effort normal.  Neurological: He is alert.  Psychiatric: Judgment and thought content normal. His affect is labile. His speech is tangential. He is actively hallucinating. Cognition and memory are impaired. He is inattentive.    Review of Systems  Constitutional: Positive for malaise/fatigue.  Psychiatric/Behavioral: Positive for hallucinations and memory loss.  All other systems reviewed and are negative.   Blood pressure 118/75, pulse 92, temperature (!) 97.5 F (36.4 C), resp. rate 19, height 5\' 8"  (1.727 m), weight (!) 153.2 kg, SpO2 95 %.Body mass index is 51.35 kg/m.  General Appearance: Casual  Eye Contact:  Good  Speech:  Normal Rate and Slurred at times  Volume:  Normal  Mood:  Euthymic  Affect:  Labile  Thought Process:  Disorganized and Descriptions of Associations: Tangential  Orientation:  Other:  person  Thought Content:  Hallucinations: Auditory Visual  Suicidal Thoughts:  No  Homicidal Thoughts:  No  Memory:  Immediate;   Poor Recent;   Poor Remote;   Poor  Judgement:  Impaired  Insight:  Fair  Psychomotor Activity:  Normal  Concentration:  Concentration: Poor and Attention Span: Poor  Recall:  Poor  Fund of  Knowledge:  Fair  Language:  Fair  Akathisia:  No  Handed:  Right  AIMS (if indicated):     Assets:  Housing Resilience  ADL's:  Impaired  Cognition:  Impaired,  Moderate  Sleep:        Treatment Plan Summary: Daily contact with patient to assess and evaluate symptoms and progress in treatment, Medication management and Plan Schizophrenia,  unspecified type:  -Continue Prolixin 15 BID  EPS: -Continue Cogentin 0.5 mg BID  Hypothyroidism: -Continue Synthroid 250 mcg, increased on 8/29 based on TSH -TSH ordered, 8/28 result of 0.25  Disposition: No evidence of imminent risk to self or others at present.   Supportive therapy provided about ongoing stressors.  Waylan Boga, NP 07/28/2019 11:50 AM

## 2019-08-06 ENCOUNTER — Other Ambulatory Visit: Payer: Self-pay

## 2019-08-06 ENCOUNTER — Non-Acute Institutional Stay: Payer: Medicaid Other | Admitting: Primary Care

## 2019-08-06 DIAGNOSIS — Z515 Encounter for palliative care: Secondary | ICD-10-CM

## 2019-08-06 NOTE — Progress Notes (Signed)
[facility] Pall[facility] Consult Note Telephone: [phone]  Fax: [phone]  TELEHEALTH VISIT STATEMENT Due to the COVID-19 crisis, this visit was done via telemedicine from my office. It was initiated and consented to by this patient and/or family.  PATIENT NAME: Jerry [name] DOB: [birth year] MRN: [id number]  PRIMARY CARE PROVIDER: Juluis [name], [place]  [place] [address]  REFERRING PROVIDER: [name], MD [address] [place],  [place] [address] [phone]  RESPONSIBLE PARTY:   Extended Emergency Contact Information Primary Emergency Contact: Jerry [name] of [place] Phone: [phone] Relation: Niece Secondary Emergency Contact: Jerry [name] States of [place] Phone: [phone] Relation: Sister  ASSESSMENT AND RECOMMENDATIONS:   1. Advance Care Planning/Goals of Care: Goals include to maximize quality of life and symptom management. Met patient on zoom, and states he has had a "wild morning".  Niece is POA and wants to discuss MOST form. DNR on record at SNF. I called Jerry [name], POA and niece. She said she was redoing the MOST and would send to SNF. She did not have any concerns or questions about the advance directives. I will upload when it is scanned to SNF.  2. Symptom Management:   Nutrition: No eating precautions, denies dysphagia subjectively. Eating 50% or better. He had pneumonia at the time of hospitalization and was in decline prior to hospital admission.  Mentation: Cannot make own decisions, h/o schizophrenia.Usually stays in a good mood.  3. Family /Caregiver/Community Supports: Niece is POA and patient also has sister. Family has not visited.  4. Cognitive / Functional decline: Alert, orient x 1, Staff reports baseline function, can feed self, can read magazines, dial phone. He cannot ambulate.  5. Follow up Palliative Care Visit: Palliative care will continue to  follow for goals of care clarification and symptom management. Return 4-6 weeks or prn.  I spent 25 minutes providing this consultation,  from 1000 to 1025. More than 50% of the time in this consultation was spent coordinating communication.   HISTORY OF PRESENT ILLNESS:  Jerry [name] is a 63 y.o. year old male with multiple medical problems including COPD, CHF, obesity, Schizophrenia. Palliative Care was asked to follow this patient by consultation request of Jerry Pitch, MD [address] [place],  [place] [address] to help address advance care planning and goals of care. This is the initial visit.  CODE STATUS: TBD  PPS: 30% HOSPICE ELIGIBILITY/DIAGNOSIS: TBD  PAST MEDICAL HISTORY:  Past Medical History:  Diagnosis Date  . BPH (benign prostatic hyperplasia)   . CHF (congestive heart failure) ([place])   . Chronic pain   . COPD (chronic obstructive pulmonary disease) ([place])   . Hypertension   . Morbid obesity ([place])   . Pressure ulcer   . Rheumatoid arthritis ([place])   . Schizophrenia ([place])   . Thyroid disease     SOCIAL HX:  Social History   Tobacco Use  . Smoking status: Never Smoker  . Smokeless tobacco: Never Used  Substance Use Topics  . Alcohol use: No    ALLERGIES:  Allergies  Allergen Reactions  . Methadone   . Penicillins     Tolerates cephalosporins   . Propoxyphene   . Sulfa Antibiotics   . Valproic Acid And Related     Thrombocytopenia     PERTINENT MEDICATIONS:  Outpatient Encounter Medications as of 08/06/2019  Medication Sig  . albuterol (PROVENTIL HFA;VENTOLIN HFA) 108 (90 Base) MCG/ACT inhaler Inhale 1 puff into  the lungs every 3 (three) hours as needed for wheezing or shortness of breath.  Marland Kitchen ammonium lactate (LAC-HYDRIN) 12 % lotion Apply 1 application topically 2 (two) times daily. Apply topically to bilateral feet  . benztropine (COGENTIN) 0.5 MG tablet Take 0.5 mg by mouth 2 (two) times daily.  Marland Kitchen docusate sodium (COLACE) 100 MG capsule Take 100 mg by  mouth 2 (two) times daily.  . fluPHENAZine (PROLIXIN) 5 MG tablet Take 3 tablets (15 mg total) by mouth daily.  . fluPHENAZine (PROLIXIN) 5 MG tablet Take 3 tablets (15 mg total) by mouth at bedtime.  . hydrocortisone (CORTEF) 10 MG tablet Take 1 tablet (10 mg total) by mouth 2 (two) times daily.  . hydroxychloroquine (PLAQUENIL) 200 MG tablet Take 200 mg by mouth 2 (two) times daily.  Marland Kitchen levothyroxine (SYNTHROID) 200 MCG tablet Take 200 mcg by mouth daily before breakfast. Take along with 25 mcg tablet for total of 225 mcg daily  . levothyroxine (SYNTHROID) 50 MCG tablet Take 1 tablet (50 mcg total) by mouth daily before breakfast.  . methocarbamol (ROBAXIN) 750 MG tablet Take 750 mg by mouth at bedtime.  . metoprolol tartrate (LOPRESSOR) 50 MG tablet Take 1 tablet (50 mg total) by mouth 2 (two) times daily.  . Multiple Vitamins-Minerals (MULTIVITAMIN WITH MINERALS) tablet Take 1 tablet by mouth daily.  . Polyethylene Glycol 3350 (MIRALAX PO) Take 17 g by mouth daily.  . tamsulosin (FLOMAX) 0.4 MG CAPS capsule Take 0.4 mg by mouth daily.  . vitamin C (ASCORBIC ACID) 250 MG tablet Take 500 mg by mouth 2 (two) times daily.   No facility-administered encounter medications on file as of 08/06/2019.     PHYSICAL EXAM / ROS:   Current and past weights: 311-313 lbs.  General: NAD, frail appearing, obese Cardiovascular: no chest pain reported, no edema reported Pulmonary: no cough, no increased SOB, room air, uses O2 prn Abdomen: appetite varied, denies constipation, incontinent of bowel GU: denies dysuria, incontinent of urine MSK:  no joint deformities, non ambulatory hoyer lift to geri-chair. Skin: no rashes or wounds reported Neurological: Weakness, h/o schizophrenia, not combative.  Cyndia Skeeters DNP AGPCNP-BC

## 2019-08-20 ENCOUNTER — Non-Acute Institutional Stay: Payer: Medicaid Other | Admitting: Primary Care

## 2019-08-20 ENCOUNTER — Other Ambulatory Visit: Payer: Self-pay

## 2019-08-20 DIAGNOSIS — Z515 Encounter for palliative care: Secondary | ICD-10-CM

## 2019-08-20 NOTE — Progress Notes (Signed)
[facility] Pall[facility] Consult Note Telephone: [phone]  Fax: [phone]  TELEHEALTH VISIT STATEMENT Due to the COVID-19 crisis, this visit was done via telemedicine from my office. It was initiated and consented to by this patient and/or family.  PATIENT NAME: Jerry [name] [address] R[address] [place] [address] [phone] (home)  DOB: 1956/05/06 MRN: [id number]  PRIMARY CARE PROVIDER:   [name], MD, [address] [place] [address] [phone]  REFERRING PROVIDER:  [name], MD [address] [place],  [place] [address] [phone]  RESPONSIBLE PARTY:   Extended Emergency Contact Information Primary Emergency Contact: Jerry [name] of [place] Phone: [phone] Relation: Niece Secondary Emergency Contact: Jerry [name] States of [place] Phone: [phone] Relation: Sister   ASSESSMENT AND RECOMMENDATIONS:   1. Advance Care Planning/Goals of Care: Goals include to maximize quality of life and symptom management. POA was to update MOST form and return to SNF. SNF SW to reach out to [facility] for MOST to be returned as did I to clarify any goals of care or planning questions. Currently FULL code.  2. Symptom Management:   Pain: Denies pain, states he is taking supplements for his RA pain  Appetite: States it is good, 100% of most meals.  3. Family /Caregiver/Community Supports: Alert and talkative but exhibits much confusion, states his mother needs money for taxes, and he helped a person from church. He mentioned rotten spinach under his eyelids.  POA was to have updated MOST form. I will upload when received.   4. Cognitive / Functional decline: At cognitive and functional baseline. Needs transfer to chair with hoyer.   5. Follow up Palliative Care Visit: Palliative care will continue to follow for goals of care clarification and symptom management. Return 4 weeks or prn.  I  spent 30 minutes providing this consultation,  from 1400 to 1430. More than 50% of the time in this consultation was spent coordinating communication.   HISTORY OF PRESENT ILLNESS:  Jerry [name] is a 63 y.o. year old male with multiple medical problems including CHF, COPD, schizophrenia, morbid obesity. Palliative Care was asked to follow this patient by consultation request of Jerry Pitch, MD to help address advance care planning and goals of care. This is a follow up visit.  CODE STATUS: FULL CODE  PPS: 40% HOSPICE ELIGIBILITY/DIAGNOSIS: TBD  PAST MEDICAL HISTORY:  Past Medical History:  Diagnosis Date  . BPH (benign prostatic hyperplasia)   . CHF (congestive heart failure) ([place])   . Chronic pain   . COPD (chronic obstructive pulmonary disease) ([place])   . Hypertension   . Morbid obesity ([place])   . Pressure ulcer   . Rheumatoid arthritis ([place])   . Schizophrenia ([place])   . Thyroid disease     SOCIAL HX:  Social History   Tobacco Use  . Smoking status: Never Smoker  . Smokeless tobacco: Never Used  Substance Use Topics  . Alcohol use: No    ALLERGIES:  Allergies  Allergen Reactions  . Methadone   . Penicillins     Tolerates cephalosporins   . Propoxyphene   . Sulfa Antibiotics   . Valproic Acid And Related     Thrombocytopenia     PERTINENT MEDICATIONS:  Outpatient Encounter Medications as of 08/20/2019  Medication Sig  . albuterol (PROVENTIL HFA;VENTOLIN HFA) 108 (90 Base) MCG/ACT inhaler Inhale 1 puff into the lungs every 3 (three) hours as needed for wheezing or shortness  of breath.  Marland Kitchen ammonium lactate (LAC-HYDRIN) 12 % lotion Apply 1 application topically 2 (two) times daily. Apply topically to bilateral feet  . benztropine (COGENTIN) 0.5 MG tablet Take 0.5 mg by mouth 2 (two) times daily.  Marland Kitchen docusate sodium (COLACE) 100 MG capsule Take 100 mg by mouth 2 (two) times daily.  . fluPHENAZine (PROLIXIN) 5 MG tablet Take 3 tablets (15 mg total) by mouth daily.  .  fluPHENAZine (PROLIXIN) 5 MG tablet Take 3 tablets (15 mg total) by mouth at bedtime.  . hydrocortisone (CORTEF) 10 MG tablet Take 1 tablet (10 mg total) by mouth 2 (two) times daily.  . hydroxychloroquine (PLAQUENIL) 200 MG tablet Take 200 mg by mouth 2 (two) times daily.  Marland Kitchen levothyroxine (SYNTHROID) 200 MCG tablet Take 200 mcg by mouth daily before breakfast. Take along with 25 mcg tablet for total of 225 mcg daily  . levothyroxine (SYNTHROID) 50 MCG tablet Take 1 tablet (50 mcg total) by mouth daily before breakfast.  . methocarbamol (ROBAXIN) 750 MG tablet Take 750 mg by mouth at bedtime.  . metoprolol tartrate (LOPRESSOR) 50 MG tablet Take 1 tablet (50 mg total) by mouth 2 (two) times daily.  . Multiple Vitamins-Minerals (MULTIVITAMIN WITH MINERALS) tablet Take 1 tablet by mouth daily.  . Polyethylene Glycol 3350 (MIRALAX PO) Take 17 g by mouth daily.  . tamsulosin (FLOMAX) 0.4 MG CAPS capsule Take 0.4 mg by mouth daily.  . vitamin C (ASCORBIC ACID) 250 MG tablet Take 500 mg by mouth 2 (two) times daily.   No facility-administered encounter medications on file as of 08/20/2019.     PHYSICAL EXAM / ROS:   Current and past weights: TBD General: NAD, obese Cardiovascular: no chest pain reported, no edema,  Pulmonary: + cough, no increased SOB, oxygen prn Abdomen: appetite good, endorses constipation, incontinent of bowel GU: denies dysuria, incontinent of urine MSK:  no joint deformities, non ambulatory, fingers very disfigured from RA, states no pain. Must be transferred by lift Skin: no rashes or wounds reported Neurological: Weakness, non ambulatory,  Denies pain, poor historian  Jerry Skeeters DNP AGPCNP-BC

## 2019-08-22 ENCOUNTER — Encounter: Payer: Self-pay | Admitting: Oncology

## 2019-08-22 ENCOUNTER — Other Ambulatory Visit: Payer: Self-pay

## 2019-08-22 NOTE — Progress Notes (Signed)
Patient unable to come to the phone spoke with nurse caring for him at [facility].

## 2019-08-25 ENCOUNTER — Other Ambulatory Visit: Payer: Self-pay

## 2019-08-25 ENCOUNTER — Inpatient Hospital Stay: Payer: Medicaid Other | Attending: Oncology | Admitting: Internal Medicine

## 2019-08-25 ENCOUNTER — Inpatient Hospital Stay: Payer: Medicaid Other

## 2019-08-25 DIAGNOSIS — I509 Heart failure, unspecified: Secondary | ICD-10-CM | POA: Diagnosis not present

## 2019-08-25 DIAGNOSIS — D696 Thrombocytopenia, unspecified: Secondary | ICD-10-CM | POA: Insufficient documentation

## 2019-08-25 DIAGNOSIS — Z79899 Other long term (current) drug therapy: Secondary | ICD-10-CM | POA: Diagnosis not present

## 2019-08-25 DIAGNOSIS — F209 Schizophrenia, unspecified: Secondary | ICD-10-CM | POA: Insufficient documentation

## 2019-08-25 DIAGNOSIS — E079 Disorder of thyroid, unspecified: Secondary | ICD-10-CM | POA: Insufficient documentation

## 2019-08-25 DIAGNOSIS — I110 Hypertensive heart disease with heart failure: Secondary | ICD-10-CM | POA: Insufficient documentation

## 2019-08-25 DIAGNOSIS — D649 Anemia, unspecified: Secondary | ICD-10-CM

## 2019-08-25 DIAGNOSIS — M069 Rheumatoid arthritis, unspecified: Secondary | ICD-10-CM | POA: Diagnosis not present

## 2019-08-25 LAB — COMPREHENSIVE METABOLIC PANEL
ALT: 11 U/L (ref 0–44)
AST: 12 U/L — ABNORMAL LOW (ref 15–41)
Albumin: 2.7 g/dL — ABNORMAL LOW (ref 3.5–5.0)
Alkaline Phosphatase: 110 U/L (ref 38–126)
Anion gap: 9 (ref 5–15)
BUN: 13 mg/dL (ref 8–23)
CO2: 27 mmol/L (ref 22–32)
Calcium: 8.3 mg/dL — ABNORMAL LOW (ref 8.9–10.3)
Chloride: 107 mmol/L (ref 98–111)
Creatinine, Ser: 0.47 mg/dL — ABNORMAL LOW (ref 0.61–1.24)
GFR calc Af Amer: >60 mL/min (ref >60)
GFR calc non Af Amer: >60 mL/min (ref >60)
Glucose, Bld: 84 mg/dL (ref 70–99)
Potassium: 3.7 mmol/L (ref 3.5–5.1)
Sodium: 143 mmol/L (ref 135–145)
Total Bilirubin: 0.3 mg/dL (ref 0.3–1.2)
Total Protein: 6.9 g/dL (ref 6.5–8.1)

## 2019-08-25 LAB — CBC WITH DIFFERENTIAL/PLATELET
Abs Immature Granulocytes: 0.03 10*3/uL (ref 0.00–0.07)
Basophils Absolute: 0 10*3/uL (ref 0.0–0.1)
Basophils Relative: 1 %
Eosinophils Absolute: 0.2 10*3/uL (ref 0.0–0.5)
Eosinophils Relative: 5 %
HCT: 25.9 % — ABNORMAL LOW (ref 39.0–52.0)
Hemoglobin: 7.7 g/dL — ABNORMAL LOW (ref 13.0–17.0)
Immature Granulocytes: 1 %
Lymphocytes Relative: 32 %
Lymphs Abs: 1.4 10*3/uL (ref 0.7–4.0)
MCH: 27.2 pg (ref 26.0–34.0)
MCHC: 29.7 g/dL — ABNORMAL LOW (ref 30.0–36.0)
MCV: 91.5 fL (ref 80.0–100.0)
Monocytes Absolute: 0.5 10*3/uL (ref 0.1–1.0)
Monocytes Relative: 12 %
Neutro Abs: 2.2 10*3/uL (ref 1.7–7.7)
Neutrophils Relative %: 49 %
Platelets: 194 10*3/uL (ref 150–400)
RBC: 2.83 MIL/uL — ABNORMAL LOW (ref 4.22–5.81)
RDW: 20.5 % — ABNORMAL HIGH (ref 11.5–15.5)
WBC: 4.3 10*3/uL (ref 4.0–10.5)
nRBC: 0 % (ref 0.0–0.2)

## 2019-08-25 LAB — SAMPLE TO BLOOD BANK

## 2019-08-25 LAB — C-REACTIVE PROTEIN: CRP: 1.2 mg/dL — ABNORMAL HIGH (ref <1.0)

## 2019-08-25 NOTE — Progress Notes (Signed)
[place] NOTE  Patient Care Team: [name], MD as PCP - General (Family Medicine) Jason Coop, NP as Nurse Practitioner (Hospice and Palliative Medicine)  CHIEF COMPLAINTS/PURPOSE OF CONSULTATION: Anemia/thrombocytopenia  #Acute on chronic severe anemia-normocytic hemoglobin around 7-8. ? poorly controlled rheumatoid arthritis. Aug 2020- iron HUDJSH/F02 folic acid/LDH reticulocyte count-unremarkable.  #August 2020 CT scan #18 mm adrenal nodule atypical #urinary bladder thickening  # intermittent thrombocytopenia-improved status post steroids in the hospital question ITP versus others. CT-NEG- liver disease/splenomegaly  #Severe rheumatoid arthritis-Plaquenil/schizophrenia poorly controlled/[facility] resident/morbid obesity/COPD CHF-nasal cannula oxygen  Oncology History   No history exists.     HISTORY OF PRESENTING ILLNESS:  Jerry [name] 63 y.o.  male has been referred to [place] for anemia/thrombocytopenia.  Patient has multiple medical problems-including poorly controlled schizophrenia/recently admitted to hospital for community-acquired pneumonia.  Patient noted to have moderate thrombocytopenia/moderate-severe anemia.  He is here for follow-up.  Patient was treated with steroids-which improved his platelet count at discharge.  However hemoglobin was 7.6 at discharge.  Patient is a [facility] resident.  Unreliable historian; patient is hallucinating actively.  States that "he wants to go to [place] this morning to impeach the president".  Denies any blood in stools or black or stools.  Review of Systems  Unable to perform ROS: Psychiatric disorder (Poorly controlled schizophrenia.)     MEDICAL HISTORY:  Past Medical History:  Diagnosis Date  . BPH (benign prostatic hyperplasia)   . CHF (congestive heart failure) ([place])   . Chronic pain   . COPD (chronic obstructive pulmonary disease) ([place])   . Hypertension   . Morbid obesity ([place])    . Pressure ulcer   . Rheumatoid arthritis ([place])   . Schizophrenia ([place])   . Thyroid disease     SURGICAL HISTORY: History reviewed. No pertinent surgical history.  SOCIAL HISTORY: Social History   Socioeconomic History  . Marital status: Single    Spouse name: Not on file  . Number of children: Not on file  . Years of education: Not on file  . Highest education level: Not on file  Occupational History  . Not on file  Social Needs  . Financial resource strain: Not on file  . Food insecurity    Worry: Not on file    Inability: Not on file  . Transportation needs    Medical: Not on file    Non-medical: Not on file  Tobacco Use  . Smoking status: Never Smoker  . Smokeless tobacco: Never Used  Substance and Sexual Activity  . Alcohol use: No  . Drug use: No  . Sexual activity: Not on file  Lifestyle  . Physical activity    Days per week: Not on file    Minutes per session: Not on file  . Stress: Not on file  Relationships  . Social Herbalist on phone: Not on file    Gets together: Not on file    Attends religious service: Not on file    Active member of club or organization: Not on file    Attends meetings of clubs or organizations: Not on file    Relationship status: Not on file  . Intimate partner violence    Fear of current or ex partner: Not on file    Emotionally abused: Not on file    Physically abused: Not on file    Forced sexual activity: Not on file  Other Topics Concern  . Not on file  Social History Narrative   Resident at [facility].    FAMILY HISTORY: Family History  Family history unknown: Yes    ALLERGIES:  is allergic to methadone; penicillins; propoxyphene; sulfa antibiotics; and valproic acid and related.  MEDICATIONS:  Current Outpatient Medications  Medication Sig Dispense Refill  . albuterol (PROVENTIL HFA;VENTOLIN HFA) 108 (90 Base) MCG/ACT inhaler Inhale into the lungs every 3 (three) hours as needed for wheezing  or shortness of breath.     . Amino Acids-Protein Hydrolys (FEEDING SUPPLEMENT, PRO-STAT SUGAR FREE 64,) LIQD Take 30 mLs by mouth 3 (three) times daily with meals. Sugar free    . ammonium lactate (LAC-HYDRIN) 12 % lotion Apply 1 application topically 2 (two) times daily. Apply topically to bilateral feet    . benztropine (COGENTIN) 0.5 MG tablet Take 0.5 mg by mouth 2 (two) times daily.    Marland Kitchen docusate sodium (COLACE) 100 MG capsule Take 100 mg by mouth 2 (two) times daily.    . fluPHENAZine (PROLIXIN) 5 MG tablet Take 3 tablets (15 mg total) by mouth daily. 30 tablet 0  . fluPHENAZine (PROLIXIN) 5 MG tablet Take 3 tablets (15 mg total) by mouth at bedtime. 30 tablet 0  . hydrocortisone (CORTEF) 10 MG tablet Take 1 tablet (10 mg total) by mouth 2 (two) times daily. 20 tablet 0  . hydroxychloroquine (PLAQUENIL) 200 MG tablet Take 200 mg by mouth 2 (two) times daily.    Marland Kitchen levothyroxine (SYNTHROID) 200 MCG tablet Take 200 mcg by mouth daily before breakfast.     . levothyroxine (SYNTHROID) 50 MCG tablet Take 1 tablet (50 mcg total) by mouth daily before breakfast. 30 tablet 0  . methocarbamol (ROBAXIN) 750 MG tablet Take 750 mg by mouth at bedtime.    . metoprolol tartrate (LOPRESSOR) 50 MG tablet Take 1 tablet (50 mg total) by mouth 2 (two) times daily.    . Multiple Vitamins-Minerals (MULTIVITAMIN WITH MINERALS) tablet Take 1 tablet by mouth daily.    . Polyethylene Glycol 3350 (MIRALAX PO) Take 17 g by mouth daily.    . tamsulosin (FLOMAX) 0.4 MG CAPS capsule Take 0.4 mg by mouth daily.    . vitamin C (ASCORBIC ACID) 250 MG tablet Take 500 mg by mouth 2 (two) times daily.     No current facility-administered medications for this visit.       Marland Kitchen  PHYSICAL EXAMINATION: ECOG PERFORMANCE STATUS: 1 - Symptomatic but completely ambulatory  Vitals:   08/25/19 0918  BP: 131/85  Pulse: 70  Temp: (!) 93.9 F (34.4 C)   There were no vitals filed for this visit.  Physical Exam   Constitutional: He is oriented to person, place, and time and well-developed, well-nourished, and in no distress.  Patient is in chair.  O2 nasal cannula.  Morbidly obese.  HENT:  Head: Normocephalic and atraumatic.  Mouth/Throat: Oropharynx is clear and moist. No oropharyngeal exudate.  Eyes: Pupils are equal, round, and reactive to light.  Neck: Normal range of motion. Neck supple.  Cardiovascular: Normal rate and regular rhythm.  Pulmonary/Chest: No respiratory distress. He has no wheezes.  Decreased air entry bilaterally.  Abdominal: Soft. Bowel sounds are normal. He exhibits no distension and no mass. There is no abdominal tenderness. There is no rebound and no guarding.  Musculoskeletal: Normal range of motion.        General: No tenderness or edema.  Neurological: He is alert and oriented to person, place, and time.  Skin: Skin is warm.  Psychiatric:  Flight of ideas/flat affect.     LABORATORY DATA:  I have reviewed the data as listed Lab Results  Component Value Date   WBC 4.3 08/25/2019   HGB 7.7 (L) 08/25/2019   HCT 25.9 (L) 08/25/2019   MCV 91.5 08/25/2019   PLT 194 08/25/2019   Recent Labs    07/26/19 0704 07/28/19 0425 08/25/19 1028  NA 156* 159* 143  K 3.3* 3.1* 3.7  CL 124* 127* 107  CO2 '24 26 27  ' GLUCOSE 89 89 84  BUN 25* 27* 13  CREATININE 1.03 1.00 0.47*  CALCIUM 8.4* 8.1* 8.3*  GFRNONAA >60 >60 >60  GFRAA >60 >60 >60  PROT  --   --  6.9  ALBUMIN  --   --  2.7*  AST  --   --  12*  ALT  --   --  11  ALKPHOS  --   --  110  BILITOT  --   --  0.3    RADIOGRAPHIC STUDIES: I have personally reviewed the radiological images as listed and agreed with the findings in the report. No results found.  ASSESSMENT & PLAN:   Normocytic anemia #Acute on chronic severe anemia-normocytic hemoglobin around 7-8  Unclear etiology-question poorly controlled rheumatoid arthritis.  Check CBC CMP CRP multiple myeloma panel kappa lambda light chain hold tube.   Previously iron EBRAXE/N40 folic acid/LDH reticulocyte count-unremarkable.  # intermittent thrombocytopenia-improved status post steroids in the hospital question ITP versus others.  No evidence of liver disease/splenomegaly are noted on the CT scan.  #Atypical adrenal nodule-18 mm ; further imaging needed. will discuss further  #Urinary bladder thickening-cystitis versus causes.  Recommend evaluation with urology.  #Schizophrenia-poorly controlled; defer to PCP  # DISPOSITION: # labs today- cbc/cmp/crp/MM panel; K/l light chains-hold tube # follow up to be decided- Dr.B  Addendum: Hemoglobin 7.7; patient not significantly symptomatic.  Monitor for now.  Await further work-up patient will need likely referral to rheumatology for treatment of his rheumatoid arthritis.  All questions were answered. The patient knows to call the clinic with any problems, questions or concerns.    Cammie Sickle, MD 08/26/2019 7:37 AM

## 2019-08-25 NOTE — Assessment & Plan Note (Addendum)
#  Acute on chronic severe anemia-normocytic hemoglobin around 7-8  Unclear etiology-question poorly controlled rheumatoid arthritis.  Check CBC CMP CRP multiple myeloma panel kappa lambda light chain hold tube.  Previously iron UGAYGE/F20 folic acid/LDH reticulocyte count-unremarkable.  # intermittent thrombocytopenia-improved status post steroids in the hospital question ITP versus others.  No evidence of liver disease/splenomegaly are noted on the CT scan.  #Atypical adrenal nodule-18 mm ; further imaging needed. will discuss further  #Urinary bladder thickening-cystitis versus causes.  Recommend evaluation with urology.  #Schizophrenia-poorly controlled; defer to PCP  # DISPOSITION: # labs today- cbc/cmp/crp/MM panel; K/l light chains-hold tube # follow up to be decided- Dr.B  Addendum: Hemoglobin 7.7; patient not significantly symptomatic.  Monitor for now.  Await further work-up patient will need likely referral to rheumatology for treatment of his rheumatoid arthritis.

## 2019-08-26 ENCOUNTER — Telehealth: Payer: Self-pay | Admitting: Internal Medicine

## 2019-08-26 ENCOUNTER — Encounter: Payer: Self-pay | Admitting: Internal Medicine

## 2019-08-26 DIAGNOSIS — D649 Anemia, unspecified: Secondary | ICD-10-CM

## 2019-08-26 LAB — KAPPA/LAMBDA LIGHT CHAINS
Kappa free light chain: 42.2 mg/L — ABNORMAL HIGH (ref 3.3–19.4)
Kappa, lambda light chain ratio: 1.2 (ref 0.26–1.65)
Lambda free light chains: 35.2 mg/L — ABNORMAL HIGH (ref 5.7–26.3)

## 2019-08-26 NOTE — Addendum Note (Signed)
Addended by: Sabino Gasser on: 08/26/2019 08:36 AM   Modules accepted: Orders

## 2019-08-26 NOTE — Telephone Encounter (Signed)
#   Recommend follow-up in approximately 1 month-CBC/BMP/hold tube- Dr.B

## 2019-08-26 NOTE — Telephone Encounter (Signed)
Colette, pls schedule and reach out to the [facility]

## 2019-08-27 ENCOUNTER — Telehealth: Payer: Self-pay | Admitting: Internal Medicine

## 2019-08-27 NOTE — Telephone Encounter (Signed)
Left a message for patient's niece/legal guardian, [name] regarding clinical update/to call us back to discuss further.

## 2019-08-29 LAB — MULTIPLE MYELOMA PANEL, SERUM
Albumin SerPl Elph-Mcnc: 2.7 g/dL — ABNORMAL LOW (ref 2.9–4.4)
Albumin/Glob SerPl: 0.8 (ref 0.7–1.7)
Alpha 1: 0.2 g/dL (ref 0.0–0.4)
Alpha2 Glob SerPl Elph-Mcnc: 0.7 g/dL (ref 0.4–1.0)
B-Globulin SerPl Elph-Mcnc: 1 g/dL (ref 0.7–1.3)
Gamma Glob SerPl Elph-Mcnc: 1.7 g/dL (ref 0.4–1.8)
Globulin, Total: 3.6 g/dL (ref 2.2–3.9)
IgA: 554 mg/dL — ABNORMAL HIGH (ref 61–437)
IgG (Immunoglobin G), Serum: 1577 mg/dL (ref 603–1613)
IgM (Immunoglobulin M), Srm: 65 mg/dL (ref 20–172)
Total Protein ELP: 6.3 g/dL (ref 6.0–8.5)

## 2019-09-11 ENCOUNTER — Inpatient Hospital Stay (HOSPITAL_COMMUNITY)
Admission: AD | Admit: 2019-09-11 | Discharge: 2019-09-16 | DRG: 177 | Disposition: A | Discharge status: Skilled Nursing Facility | Payer: Medicaid Other | Source: Skilled Nursing Facility | Attending: Internal Medicine | Admitting: Internal Medicine

## 2019-09-11 ENCOUNTER — Emergency Department: Payer: Medicaid Other

## 2019-09-11 ENCOUNTER — Other Ambulatory Visit: Payer: Self-pay

## 2019-09-11 ENCOUNTER — Emergency Department
Admission: EM | Admit: 2019-09-11 | Discharge: 2019-09-11 | Disposition: A | Discharge status: Other Acute Inpatient Hospital | Payer: Medicaid Other | Attending: Emergency Medicine | Admitting: Emergency Medicine

## 2019-09-11 ENCOUNTER — Encounter (HOSPITAL_COMMUNITY): Payer: Self-pay

## 2019-09-11 DIAGNOSIS — I509 Heart failure, unspecified: Secondary | ICD-10-CM | POA: Diagnosis not present

## 2019-09-11 DIAGNOSIS — E66813 Obesity, class 3: Secondary | ICD-10-CM | POA: Diagnosis present

## 2019-09-11 DIAGNOSIS — M069 Rheumatoid arthritis, unspecified: Secondary | ICD-10-CM | POA: Diagnosis present

## 2019-09-11 DIAGNOSIS — I110 Hypertensive heart disease with heart failure: Secondary | ICD-10-CM | POA: Diagnosis present

## 2019-09-11 DIAGNOSIS — Z79899 Other long term (current) drug therapy: Secondary | ICD-10-CM | POA: Diagnosis not present

## 2019-09-11 DIAGNOSIS — D509 Iron deficiency anemia, unspecified: Secondary | ICD-10-CM | POA: Diagnosis present

## 2019-09-11 DIAGNOSIS — J1289 Other viral pneumonia: Secondary | ICD-10-CM | POA: Diagnosis present

## 2019-09-11 DIAGNOSIS — U071 COVID-19: Secondary | ICD-10-CM | POA: Diagnosis present

## 2019-09-11 DIAGNOSIS — Z820 Family history of epilepsy and other diseases of the nervous system: Secondary | ICD-10-CM

## 2019-09-11 DIAGNOSIS — Z6841 Body Mass Index (BMI) 40.0 and over, adult: Secondary | ICD-10-CM

## 2019-09-11 DIAGNOSIS — E662 Morbid (severe) obesity with alveolar hypoventilation: Secondary | ICD-10-CM | POA: Diagnosis present

## 2019-09-11 DIAGNOSIS — R0602 Shortness of breath: Secondary | ICD-10-CM | POA: Diagnosis present

## 2019-09-11 DIAGNOSIS — F209 Schizophrenia, unspecified: Secondary | ICD-10-CM | POA: Diagnosis present

## 2019-09-11 DIAGNOSIS — J449 Chronic obstructive pulmonary disease, unspecified: Secondary | ICD-10-CM | POA: Insufficient documentation

## 2019-09-11 DIAGNOSIS — E039 Hypothyroidism, unspecified: Secondary | ICD-10-CM | POA: Diagnosis present

## 2019-09-11 DIAGNOSIS — I5032 Chronic diastolic (congestive) heart failure: Secondary | ICD-10-CM | POA: Diagnosis present

## 2019-09-11 DIAGNOSIS — R1310 Dysphagia, unspecified: Secondary | ICD-10-CM | POA: Diagnosis present

## 2019-09-11 DIAGNOSIS — D72819 Decreased white blood cell count, unspecified: Secondary | ICD-10-CM | POA: Diagnosis present

## 2019-09-11 DIAGNOSIS — Z7189 Other specified counseling: Secondary | ICD-10-CM

## 2019-09-11 DIAGNOSIS — Z8701 Personal history of pneumonia (recurrent): Secondary | ICD-10-CM | POA: Diagnosis not present

## 2019-09-11 DIAGNOSIS — Z66 Do not resuscitate: Secondary | ICD-10-CM | POA: Diagnosis present

## 2019-09-11 DIAGNOSIS — J440 Chronic obstructive pulmonary disease with acute lower respiratory infection: Secondary | ICD-10-CM | POA: Diagnosis present

## 2019-09-11 DIAGNOSIS — Z7952 Long term (current) use of systemic steroids: Secondary | ICD-10-CM

## 2019-09-11 DIAGNOSIS — N400 Enlarged prostate without lower urinary tract symptoms: Secondary | ICD-10-CM | POA: Diagnosis present

## 2019-09-11 DIAGNOSIS — D696 Thrombocytopenia, unspecified: Secondary | ICD-10-CM | POA: Diagnosis present

## 2019-09-11 LAB — CBC
HCT: 26.5 % — ABNORMAL LOW (ref 39.0–52.0)
Hemoglobin: 7.9 g/dL — ABNORMAL LOW (ref 13.0–17.0)
MCH: 26.8 pg (ref 26.0–34.0)
MCHC: 29.8 g/dL — ABNORMAL LOW (ref 30.0–36.0)
MCV: 89.8 fL (ref 80.0–100.0)
Platelets: 148 10*3/uL — ABNORMAL LOW (ref 150–400)
RBC: 2.95 MIL/uL — ABNORMAL LOW (ref 4.22–5.81)
RDW: 17.4 % — ABNORMAL HIGH (ref 11.5–15.5)
WBC: 2 10*3/uL — ABNORMAL LOW (ref 4.0–10.5)
nRBC: 0 % (ref 0.0–0.2)

## 2019-09-11 LAB — CBC WITH DIFFERENTIAL/PLATELET
Abs Immature Granulocytes: 0.02 10*3/uL (ref 0.00–0.07)
Basophils Absolute: 0 10*3/uL (ref 0.0–0.1)
Basophils Relative: 0 %
Eosinophils Absolute: 0.1 10*3/uL (ref 0.0–0.5)
Eosinophils Relative: 3 %
HCT: 25.7 % — ABNORMAL LOW (ref 39.0–52.0)
Hemoglobin: 7.8 g/dL — ABNORMAL LOW (ref 13.0–17.0)
Immature Granulocytes: 1 %
Lymphocytes Relative: 29 %
Lymphs Abs: 1 10*3/uL (ref 0.7–4.0)
MCH: 26.8 pg (ref 26.0–34.0)
MCHC: 30.4 g/dL (ref 30.0–36.0)
MCV: 88.3 fL (ref 80.0–100.0)
Monocytes Absolute: 0.4 10*3/uL (ref 0.1–1.0)
Monocytes Relative: 12 %
Neutro Abs: 1.8 10*3/uL (ref 1.7–7.7)
Neutrophils Relative %: 55 %
Platelets: 149 10*3/uL — ABNORMAL LOW (ref 150–400)
RBC: 2.91 MIL/uL — ABNORMAL LOW (ref 4.22–5.81)
RDW: 17.5 % — ABNORMAL HIGH (ref 11.5–15.5)
WBC: 3.2 10*3/uL — ABNORMAL LOW (ref 4.0–10.5)
nRBC: 0 % (ref 0.0–0.2)

## 2019-09-11 LAB — BRAIN NATRIURETIC PEPTIDE: B Natriuretic Peptide: 386 pg/mL — ABNORMAL HIGH (ref 0.0–100.0)

## 2019-09-11 LAB — COMPREHENSIVE METABOLIC PANEL
ALT: 11 U/L (ref 0–44)
AST: 21 U/L (ref 15–41)
Albumin: 2.9 g/dL — ABNORMAL LOW (ref 3.5–5.0)
Alkaline Phosphatase: 86 U/L (ref 38–126)
Anion gap: 7 (ref 5–15)
BUN: 10 mg/dL (ref 8–23)
CO2: 26 mmol/L (ref 22–32)
Calcium: 8.1 mg/dL — ABNORMAL LOW (ref 8.9–10.3)
Chloride: 104 mmol/L (ref 98–111)
Creatinine, Ser: 0.45 mg/dL — ABNORMAL LOW (ref 0.61–1.24)
GFR calc Af Amer: >60 mL/min (ref >60)
GFR calc non Af Amer: >60 mL/min (ref >60)
Glucose, Bld: 87 mg/dL (ref 70–99)
Potassium: 3.7 mmol/L (ref 3.5–5.1)
Sodium: 137 mmol/L (ref 135–145)
Total Bilirubin: 0.6 mg/dL (ref 0.3–1.2)
Total Protein: 7.1 g/dL (ref 6.5–8.1)

## 2019-09-11 LAB — LACTIC ACID, PLASMA
Lactic Acid, Venous: 0.7 mmol/L (ref 0.5–1.9)
Lactic Acid, Venous: 0.7 mmol/L (ref 0.5–1.9)

## 2019-09-11 LAB — FIBRINOGEN: Fibrinogen: 615 mg/dL — ABNORMAL HIGH (ref 210–475)

## 2019-09-11 LAB — CREATININE, SERUM
Creatinine, Ser: 0.49 mg/dL — ABNORMAL LOW (ref 0.61–1.24)
GFR calc Af Amer: >60 mL/min (ref >60)
GFR calc non Af Amer: >60 mL/min (ref >60)

## 2019-09-11 LAB — PROTIME-INR
INR: 1 (ref 0.8–1.2)
Prothrombin Time: 13.5 seconds (ref 11.4–15.2)

## 2019-09-11 LAB — FERRITIN: Ferritin: 113 ng/mL (ref 24–336)

## 2019-09-11 LAB — APTT: aPTT: 29 seconds (ref 24–36)

## 2019-09-11 LAB — SARS CORONAVIRUS 2 BY RT PCR (HOSPITAL ORDER, PERFORMED IN ~~LOC~~ HOSPITAL LAB): SARS Coronavirus 2: POSITIVE — AB

## 2019-09-11 LAB — FIBRIN DERIVATIVES D-DIMER (ARMC ONLY): Fibrin derivatives D-dimer (ARMC): 1102.69 ng/mL (FEU) — ABNORMAL HIGH (ref 0.00–499.00)

## 2019-09-11 LAB — TRIGLYCERIDES: Triglycerides: 45 mg/dL (ref <150)

## 2019-09-11 LAB — C-REACTIVE PROTEIN: CRP: 10.7 mg/dL — ABNORMAL HIGH (ref <1.0)

## 2019-09-11 LAB — LACTATE DEHYDROGENASE: LDH: 260 U/L — ABNORMAL HIGH (ref 98–192)

## 2019-09-11 LAB — ABO/RH: ABO/RH(D): B POS

## 2019-09-11 LAB — MAGNESIUM: Magnesium: 1.9 mg/dL (ref 1.7–2.4)

## 2019-09-11 LAB — PHOSPHORUS: Phosphorus: 3.3 mg/dL (ref 2.5–4.6)

## 2019-09-11 LAB — PROCALCITONIN: Procalcitonin: <0.1 ng/mL

## 2019-09-11 MED ORDER — LACTATED RINGERS IV SOLN
INTRAVENOUS | Status: DC
  Administered 2019-09-11 – 2019-09-12 (×2): via INTRAVENOUS

## 2019-09-11 MED ORDER — ENOXAPARIN SODIUM 80 MG/0.8ML ~~LOC~~ SOLN
70.0000 mg | SUBCUTANEOUS | Status: DC
  Administered 2019-09-11 – 2019-09-16 (×6): 70 mg via SUBCUTANEOUS
  Filled 2019-09-11 (×6): qty 0.8

## 2019-09-11 MED ORDER — ADULT MULTIVITAMIN W/MINERALS CH
1.0000 | ORAL_TABLET | Freq: Every day | ORAL | Status: DC
  Administered 2019-09-11 – 2019-09-16 (×6): 1 via ORAL
  Filled 2019-09-11 (×6): qty 1

## 2019-09-11 MED ORDER — VANCOMYCIN HCL IN DEXTROSE 1-5 GM/200ML-% IV SOLN: 1000.0000 mg | Freq: Once | INTRAVENOUS | Status: DC

## 2019-09-11 MED ORDER — LACTATED RINGERS IV SOLN: INTRAVENOUS | Status: DC

## 2019-09-11 MED ORDER — IPRATROPIUM-ALBUTEROL 0.5-2.5 (3) MG/3ML IN SOLN
RESPIRATORY_TRACT | Status: AC
  Administered 2019-09-11: 02:00:00 3 mL via RESPIRATORY_TRACT
  Filled 2019-09-11: qty 6

## 2019-09-11 MED ORDER — IPRATROPIUM-ALBUTEROL 0.5-2.5 (3) MG/3ML IN SOLN
3.0000 mL | Freq: Once | RESPIRATORY_TRACT | Status: AC
  Administered 2019-09-11: 02:00:00 3 mL via RESPIRATORY_TRACT

## 2019-09-11 MED ORDER — METHOCARBAMOL 500 MG PO TABS
750.0000 mg | ORAL_TABLET | Freq: Every day | ORAL | Status: DC
  Administered 2019-09-11 – 2019-09-15 (×5): 750 mg via ORAL
  Filled 2019-09-11: qty 1.5
  Filled 2019-09-11 (×2): qty 2

## 2019-09-11 MED ORDER — ACETAMINOPHEN 325 MG PO TABS: 650.0000 mg | ORAL_TABLET | Freq: Four times a day (QID) | ORAL | Status: DC | PRN

## 2019-09-11 MED ORDER — SODIUM CHLORIDE 0.9 % IV SOLN
2.0000 g | Freq: Once | INTRAVENOUS | Status: AC
  Administered 2019-09-11: 02:00:00 2 g via INTRAVENOUS
  Filled 2019-09-11: qty 2

## 2019-09-11 MED ORDER — BENZTROPINE MESYLATE 0.5 MG PO TABS
0.5000 mg | ORAL_TABLET | Freq: Two times a day (BID) | ORAL | Status: DC
  Administered 2019-09-11 – 2019-09-16 (×10): 0.5 mg via ORAL
  Filled 2019-09-11 (×14): qty 1

## 2019-09-11 MED ORDER — POLYETHYLENE GLYCOL 3350 17 G PO PACK: 17.0000 g | PACK | Freq: Every day | ORAL | Status: DC | PRN

## 2019-09-11 MED ORDER — VANCOMYCIN HCL 10 G IV SOLR
2500.0000 mg | Freq: Once | INTRAVENOUS | Status: AC
  Administered 2019-09-11: 02:00:00 2500 mg via INTRAVENOUS
  Filled 2019-09-11: qty 2500

## 2019-09-11 MED ORDER — FLUPHENAZINE HCL 5 MG PO TABS
15.0000 mg | ORAL_TABLET | Freq: Every day | ORAL | Status: DC
  Administered 2019-09-11 – 2019-09-16 (×6): 15 mg via ORAL
  Filled 2019-09-11 (×8): qty 1

## 2019-09-11 MED ORDER — PRO-STAT SUGAR FREE PO LIQD
30.0000 mL | Freq: Three times a day (TID) | ORAL | Status: DC
  Administered 2019-09-11 – 2019-09-16 (×17): 30 mL via ORAL
  Filled 2019-09-11 (×15): qty 30

## 2019-09-11 MED ORDER — SODIUM CHLORIDE 0.9 % IV SOLN
200.0000 mg | Freq: Once | INTRAVENOUS | Status: AC
  Administered 2019-09-11: 200 mg via INTRAVENOUS
  Filled 2019-09-11: qty 40

## 2019-09-11 MED ORDER — SODIUM CHLORIDE 0.9% FLUSH
3.0000 mL | Freq: Two times a day (BID) | INTRAVENOUS | Status: DC
  Administered 2019-09-11 – 2019-09-16 (×11): 3 mL via INTRAVENOUS

## 2019-09-11 MED ORDER — SODIUM CHLORIDE 0.9 % IV SOLN
100.0000 mg | INTRAVENOUS | Status: AC
  Administered 2019-09-12 – 2019-09-15 (×4): 100 mg via INTRAVENOUS
  Filled 2019-09-11 (×4): qty 20

## 2019-09-11 MED ORDER — METHYLPREDNISOLONE SODIUM SUCC 125 MG IJ SOLR
60.0000 mg | Freq: Every day | INTRAMUSCULAR | Status: DC
  Administered 2019-09-12 – 2019-09-14 (×3): 60 mg via INTRAVENOUS
  Filled 2019-09-11 (×4): qty 2

## 2019-09-11 MED ORDER — POLYETHYLENE GLYCOL 3350 17 GM/SCOOP PO POWD
17.0000 g | Freq: Every day | ORAL | Status: DC
  Filled 2019-09-11: qty 255

## 2019-09-11 MED ORDER — DEXAMETHASONE SODIUM PHOSPHATE 10 MG/ML IJ SOLN
20.0000 mg | Freq: Once | INTRAMUSCULAR | Status: AC
  Administered 2019-09-11: 01:00:00 20 mg via INTRAVENOUS
  Filled 2019-09-11: qty 2

## 2019-09-11 MED ORDER — DOCUSATE SODIUM 100 MG PO CAPS
100.0000 mg | ORAL_CAPSULE | Freq: Two times a day (BID) | ORAL | Status: DC
  Administered 2019-09-11 – 2019-09-16 (×7): 100 mg via ORAL
  Filled 2019-09-11 (×8): qty 1

## 2019-09-11 MED ORDER — LEVOTHYROXINE SODIUM 75 MCG PO TABS
200.0000 ug | ORAL_TABLET | Freq: Every day | ORAL | Status: DC
  Administered 2019-09-12: 200 ug via ORAL
  Filled 2019-09-11: qty 2

## 2019-09-11 MED ORDER — LEVOTHYROXINE SODIUM 50 MCG PO TABS
50.0000 ug | ORAL_TABLET | Freq: Every day | ORAL | Status: DC
  Administered 2019-09-12: 50 ug via ORAL
  Filled 2019-09-11: qty 1

## 2019-09-11 MED ORDER — FLUPHENAZINE HCL 5 MG PO TABS
15.0000 mg | ORAL_TABLET | Freq: Every day | ORAL | Status: DC
  Administered 2019-09-11 – 2019-09-15 (×5): 15 mg via ORAL
  Filled 2019-09-11 (×6): qty 1

## 2019-09-11 MED ORDER — TAMSULOSIN HCL 0.4 MG PO CAPS
0.4000 mg | ORAL_CAPSULE | Freq: Every day | ORAL | Status: DC
  Administered 2019-09-11 – 2019-09-16 (×6): 0.4 mg via ORAL
  Filled 2019-09-11 (×6): qty 1

## 2019-09-11 MED ORDER — VITAMIN C 500 MG PO TABS
500.0000 mg | ORAL_TABLET | Freq: Two times a day (BID) | ORAL | Status: DC
  Administered 2019-09-11 – 2019-09-16 (×11): 500 mg via ORAL
  Filled 2019-09-11 (×11): qty 1

## 2019-09-11 MED ORDER — METHYLPREDNISOLONE SODIUM SUCC 125 MG IJ SOLR: 60.0000 mg | Freq: Two times a day (BID) | INTRAMUSCULAR | Status: DC

## 2019-09-11 MED ORDER — METOPROLOL TARTRATE 50 MG PO TABS
50.0000 mg | ORAL_TABLET | Freq: Two times a day (BID) | ORAL | Status: DC
  Administered 2019-09-11 – 2019-09-14 (×7): 50 mg via ORAL
  Filled 2019-09-11 (×7): qty 1

## 2019-09-11 MED ORDER — ALBUTEROL SULFATE HFA 108 (90 BASE) MCG/ACT IN AERS
2.0000 | INHALATION_SPRAY | RESPIRATORY_TRACT | Status: DC | PRN
  Administered 2019-09-13 (×2): 2 via RESPIRATORY_TRACT
  Filled 2019-09-11: qty 6.7

## 2019-09-11 MED ORDER — POLYETHYLENE GLYCOL 3350 17 G PO PACK
17.0000 g | PACK | Freq: Every day | ORAL | Status: DC
  Administered 2019-09-11 – 2019-09-16 (×4): 17 g via ORAL
  Filled 2019-09-11 (×5): qty 1

## 2019-09-11 NOTE — Plan of Care (Signed)
New admission from [facility]. Pt is Alert and Oriented to self only. Cooperative at times, Poor memory, unable to obtain accurate history from pt, will look at admission paperwork from facility. No personal belongings present upon admission. Will implement all MD orders per protocol.

## 2019-09-11 NOTE — ED Provider Notes (Signed)
[facility] Emergency Department Provider Note   First MD Initiated Contact with Patient 09/11/19 0026     (approximate)  I have reviewed the triage vital signs and the nursing notes.   HISTORY  Chief Complaint Shortness of Breath   HPI Jerry [name] is a 63 y.o. male with below list of previous medical conditions presents to the emergency department via EMS from [facility] nursing facility secondary to progressive dyspnea.  Per EMS [facility] has been treating the patient for pneumonia over the past month.  Patient admits to progressive dyspnea over the past week.  He also admits to productive cough.  Patient admits to subjective fevers.        Past Medical History:  Diagnosis Date  . BPH (benign prostatic hyperplasia)   . CHF (congestive heart failure) ([place])   . Chronic pain   . COPD (chronic obstructive pulmonary disease) ([place])   . Hypertension   . Morbid obesity ([place])   . Pressure ulcer   . Rheumatoid arthritis ([place])   . Schizophrenia ([place])   . Thyroid disease     Patient Active Problem List   Diagnosis Date Noted  . Normocytic anemia 08/25/2019  . Community acquired pneumonia   . DNR (do not resuscitate) discussion   . SOB (shortness of breath) 07/16/2019  . Obesity hypoventilation syndrome ([place])   . Schizophrenia ([place])   . Palliative care by specialist   . Advance care planning   . Goals of care, counseling/discussion   . CHF (congestive heart failure) ([place])   . Altered mental status   . Septic shock ([place]) 08/02/2017  . Pneumonia 06/21/2017  . Thrombocytopenia ([place]) 06/21/2017  . Leukopenia 06/21/2017  . Pressure injury of skin 06/21/2017    No past surgical history on file.  Prior to Admission medications   Medication Sig Start Date End Date Taking? Authorizing Provider  albuterol (PROVENTIL HFA;VENTOLIN HFA) 108 (90 Base) MCG/ACT inhaler Inhale into the lungs every 3 (three) hours as needed for wheezing or shortness of  breath.     [provider]  Amino Acids-Protein Hydrolys (FEEDING SUPPLEMENT, PRO-STAT SUGAR FREE 64,) LIQD Take 30 mLs by mouth 3 (three) times daily with meals. Sugar free    [provider]  ammonium lactate (LAC-HYDRIN) 12 % lotion Apply 1 application topically 2 (two) times daily. Apply topically to bilateral feet    [provider]  benztropine (COGENTIN) 0.5 MG tablet Take 0.5 mg by mouth 2 (two) times daily.    [provider]  docusate sodium (COLACE) 100 MG capsule Take 100 mg by mouth 2 (two) times daily.    [provider]  fluPHENAZine (PROLIXIN) 5 MG tablet Take 3 tablets (15 mg total) by mouth daily. 07/29/19   Bettey Costa, MD  fluPHENAZine (PROLIXIN) 5 MG tablet Take 3 tablets (15 mg total) by mouth at bedtime. 07/28/19   Bettey Costa, MD  hydrocortisone (CORTEF) 10 MG tablet Take 1 tablet (10 mg total) by mouth 2 (two) times daily. 12/30/17   Fritzi Mandes, MD  hydroxychloroquine (PLAQUENIL) 200 MG tablet Take 200 mg by mouth 2 (two) times daily.    [provider]  levothyroxine (SYNTHROID) 200 MCG tablet Take 200 mcg by mouth daily before breakfast.     [provider]  levothyroxine (SYNTHROID) 50 MCG tablet Take 1 tablet (50 mcg total) by mouth daily before breakfast. 07/29/19   Bettey Costa, MD  methocarbamol (ROBAXIN) 750 MG tablet Take 750 mg by  mouth at bedtime.    [provider]  metoprolol tartrate (LOPRESSOR) 50 MG tablet Take 1 tablet (50 mg total) by mouth 2 (two) times daily. 07/28/19   Bettey Costa, MD  Multiple Vitamins-Minerals (MULTIVITAMIN WITH MINERALS) tablet Take 1 tablet by mouth daily.    [provider]  Polyethylene Glycol 3350 (MIRALAX PO) Take 17 g by mouth daily.    [provider]  tamsulosin (FLOMAX) 0.4 MG CAPS capsule Take 0.4 mg by mouth daily.    [provider]  vitamin C (ASCORBIC ACID) 250 MG tablet Take 500 mg by mouth 2 (two) times daily.    [provider]    Allergies Methadone, Penicillins, Propoxyphene, Sulfa antibiotics, and Valproic acid and related  Family History  Family history unknown: Yes    Social History Social History   Tobacco Use  . Smoking status: Never Smoker  . Smokeless tobacco: Never Used  Substance Use Topics  . Alcohol use: No  . Drug use: No    Review of Systems Constitutional: Positive for fever/chills Eyes: No visual changes. ENT: No sore throat. Cardiovascular: Denies chest pain. Respiratory: Positive for cough and dyspnea Gastrointestinal: No abdominal pain.  No nausea, no vomiting.  No diarrhea.  No constipation. Genitourinary: Negative for dysuria. Musculoskeletal: Negative for neck pain.  Negative for back pain. Integumentary: Negative for rash. Neurological: Negative for headaches, focal weakness or numbness. __________________   PHYSICAL EXAM:  VITAL SIGNS: ED Triage Vitals  Enc Vitals Group     BP 09/11/19 0020 127/84     Pulse Rate 09/11/19 0020 85     Resp 09/11/19 0018 (!) 27     Temp 09/11/19 0020 99.3 F (37.4 C)     Temp Source 09/11/19 0020 Oral     SpO2 09/11/19 0018 91 %     Weight 09/11/19 0024 (!) 142 kg (313 lb)     Height 09/11/19 0024 1.88 m (6\' 2" )     Head Circumference --      [facility] --      Pain Score 09/11/19 0023 4     Pain Loc --      Pain Edu? --      Excl. in [place]? --     Constitutional: Alert and oriented.  Eyes: Conjunctivae are normal.  Mouth/Throat: Mucous membranes are moist. Neck: No stridor.  No meningeal signs.   Cardiovascular: Normal rate, regular rhythm. Good peripheral circulation. Grossly normal heart sounds. Respiratory: Tachypnea, diffuse rhonchi Gastrointestinal: Soft and nontender. No distention.  Musculoskeletal: No lower extremity tenderness nor edema. No gross deformities of extremities. Neurologic:  Normal speech and language. No gross focal neurologic deficits are appreciated.  Skin:  Skin is warm, dry and  intact. Psychiatric: Mood and affect are normal. Speech and behavior are normal.  ____________________________________________   LABS (all labs ordered are listed, but only abnormal results are displayed)  Labs Reviewed  SARS CORONAVIRUS 2 BY RT PCR (HOSPITAL ORDER, [facility]) - Abnormal; Notable for the following components:      Result Value   SARS Coronavirus 2 POSITIVE (*)    All other components within normal limits  CBC WITH DIFFERENTIAL/PLATELET - Abnormal; Notable for the following components:   WBC 3.2 (*)    RBC 2.91 (*)    Hemoglobin 7.8 (*)    HCT 25.7 (*)    RDW 17.5 (*)    Platelets 149 (*)    All other components within normal limits  COMPREHENSIVE METABOLIC PANEL - Abnormal; Notable for the following components:   Creatinine, Ser 0.45 (*)    Calcium 8.1 (*)    Albumin 2.9 (*)    All other components within normal limits  FIBRIN DERIVATIVES D-DIMER ([facility] ONLY) - Abnormal; Notable for the following components:   Fibrin derivatives D-dimer ([facility]) 1,102.69 (*)    All other components within normal limits  LACTATE DEHYDROGENASE - Abnormal; Notable for the following components:   LDH 260 (*)    All other components within normal limits  FIBRINOGEN - Abnormal; Notable for the following components:   Fibrinogen 615 (*)    All other components within normal limits  BRAIN NATRIURETIC PEPTIDE - Abnormal; Notable for the following components:   B Natriuretic Peptide 386.0 (*)    All other components within normal limits  CULTURE, BLOOD (ROUTINE X 2)  CULTURE, BLOOD (ROUTINE X 2)  LACTIC ACID, PLASMA  PROCALCITONIN  FERRITIN  TRIGLYCERIDES  APTT  PROTIME-INR  LACTIC ACID, PLASMA  MAGNESIUM  PHOSPHORUS  C-REACTIVE PROTEIN   ____________________________________________  EKG  ED ECG REPORT I, Matawan N Sadarius Norman, the attending physician, personally viewed and interpreted this ECG.   Date: 09/11/2019  EKG Time: 12:24 AM  Rate: 73   Rhythm: Normal sinus rhythm  Axis: Normal  Intervals: Prolonged QTC 544  ST&T Change: None  ____________________________________________  RADIOLOGY I, Tracy N Jarold Macomber, personally viewed and evaluated these images (plain radiographs) as part of my medical decision making, as well as reviewing the written report by the radiologist.  ED MD interpretation: Chest x-ray consistent with progressive multifocal right lung opacities compatible with pneumonia since August comparisons per radiologist.  Official radiology report(s): Dg Chest Port 1 View  Result Date: 09/11/2019 CLINICAL DATA:  63 year old male with shortness of breath. Has been treated for pneumonia in the past couple of months. EXAM: PORTABLE CHEST 1 VIEW COMPARISON:  Portable chest and chest CT 07/16/2019 and earlier. FINDINGS: Portable AP semi upright view at 0032 hours. New patchy and indistinct right upper and lower lung opacity when compared to August. Persistent hypo ventilation in the left lower lung appears not significantly changed from that time. Only the left upper lobe appears relatively clear today. Stable cardiac size and mediastinal contours. Visualized tracheal air column is within normal limits. No pneumothorax.  No pleural effusion suspected. IMPRESSION: Progressed multifocal right lung opacity compatible with pneumonia since the August comparisons. Continued abnormal left lung base opacity. No pleural effusion. Electronically Signed   By: Genevie Ann M.D.   On: 09/11/2019 01:01    ____________________________________________   PROCEDURES    .Critical Care Performed by: Gregor Hams, MD Authorized by: Gregor Hams, MD   Critical care provider statement:    Critical care time (minutes):  30   Critical care time was exclusive of:  Separately billable procedures and treating other patients (COVID-19 infection)   Critical care was necessary to treat or prevent imminent or life-threatening deterioration of  the following conditions:  Respiratory failure   Critical care was time spent personally by me on the following activities:  Development of treatment plan with patient or surrogate, discussions with consultants, evaluation of patient's response to treatment, examination of patient, obtaining history from patient or surrogate, ordering and performing treatments and interventions, ordering and review of laboratory studies, ordering and review of radiographic studies, pulse oximetry, re-evaluation of patient's condition and review of old charts     ____________________________________________   [place] / MDM / ASSESSMENT AND PLAN /  ED COURSE  As part of my medical decision making, I reviewed the following data within the electronic MEDICAL RECORD NUMBER   63 year old male presenting with above-stated history and physical exam concerning for COVID-19 infection, pneumonia, sepsis.  As such sepsis protocol was initiated.  Chest x-ray consistent with multifocal pneumonia however concern for COVID-19 especially given known outbreak of COVID-19 at [facility].  Patient's COVID swab positive.  Patient was given Decadron 20 mg IV shortly after arrival to the emergency department.  Patient currently on 4 L oxygen via nasal cannula.  Patient discussed with Dr. Olevia Bowens at [facility] who accepted the patient in transfer   Clinical Course as of Sep 10 414  Thu Sep 11, 2019  0047 30  Resp: 18 [RB]    Clinical Course User Index [RB] Gregor Hams, MD     ____________________________________________  FINAL CLINICAL IMPRESSION(S) / ED DIAGNOSES  Final diagnoses:  COVID-19 virus infection     MEDICATIONS GIVEN DURING THIS VISIT:  Medications  dexamethasone (DECADRON) injection 20 mg (20 mg Intravenous Given 09/11/19 0048)  ceFEPIme (MAXIPIME) 2 g in sodium chloride 0.9 % 100 mL IVPB (0 g Intravenous Stopped 09/11/19 0220)  vancomycin (VANCOCIN) 2,500 mg in sodium chloride  0.9 % 500 mL IVPB (2,500 mg Intravenous New Bag/Given 09/11/19 0147)  ipratropium-albuterol (DUONEB) 0.5-2.5 (3) MG/3ML nebulizer solution 3 mL (3 mLs Nebulization Given 09/11/19 0148)  ipratropium-albuterol (DUONEB) 0.5-2.5 (3) MG/3ML nebulizer solution 3 mL (3 mLs Nebulization Given 09/11/19 0148)     ED Discharge Orders    None      *Please note:  Tighe Weng was evaluated in Emergency Department on 09/11/2019 for the symptoms described in the history of present illness. He was evaluated in the context of the global COVID-19 pandemic, which necessitated consideration that the patient might be at risk for infection with the SARS-CoV-2 virus that causes COVID-19. Institutional protocols and algorithms that pertain to the evaluation of patients at risk for COVID-19 are in a state of rapid change based on information released by regulatory bodies including the CDC and federal and state organizations. These policies and algorithms were followed during the patient's care in the ED.  Some ED evaluations and interventions may be delayed as a result of limited staffing during the pandemic.*  Note:  This document was prepared using Dragon voice recognition software and may include unintentional dictation errors.   Gregor Hams, MD 09/11/19 [phone]

## 2019-09-11 NOTE — ED Triage Notes (Signed)
Pt BIB [facility] from [facility] with CC of SOB. Reportedly patient has been treated for pneumonias over the past couple of months and has had increasing SOB over the last week. The physician at the facility he stays at decided to send him to the ED to be treated for COVID though his COVID test results are still pending.

## 2019-09-11 NOTE — H&P (Signed)
TRH H&P   Patient Demographics:    Jerry [name], is a 63 y.o. male  MRN: [id number]   DOB - 1956/06/26  Admit Date - 09/11/2019  Outpatient Primary MD for the patient is [name], MD  Patient coming from: [facility], SNF  CC - found to be short of breath at [facility] and [place].   HPI:    Jerry [name]  is a 63 y.o. male, with history of schizophrenia, severe extrapyramidal symptoms question underlying Parkinson's, chronic dysphagia with aspiration, advanced rheumatoid arthritis with multiple contractures in upper and lower extremities, extremely poor functional status largely bed and chair bound at SNF, BPH, chronic diastolic CHF last EF 123456 on echocardiogram done in 2018, morbid obesity, history of peptic ulcer disease, BPH, hypothyroidism, chronic aspiration pneumonias with baseline DNR status.  With above history who has been struggling with recurrent aspiration pneumonias for several months, in the past he has decided not to get a feeding tube and to focus on being comfortable if he declines, lives in a nursing home and for the last few days was noted to be slightly short of breath, further work-up showed that he was COVID-19 positive he was then sent to [facility] ER from where he was transferred to [facility] for COVID-19 pneumonitis and further care.  Patient besides mild cough and shortness of breath is symptom-free, denies any headache chest pain or palpitations, no gastroenteritis no focal weakness.    Review of systems:     A full 10 point Review of Systems was done, except as stated above, all other Review of Systems were negative.   With Past History of the following :    Past Medical History:  Diagnosis  Date  . BPH (benign prostatic hyperplasia)   . CHF (congestive heart failure) ([place])   . Chronic pain   . COPD (chronic obstructive pulmonary disease) ([place])   . Hypertension   . Morbid obesity ([place])   . Pressure ulcer   . Rheumatoid arthritis ([place])   . Schizophrenia ([place])   . Thyroid disease       Denies any major surgical procedures in the past.   Social History:     Social History   Tobacco Use  . Smoking status: Never Smoker  . Smokeless tobacco: Never Used  Substance Use Topics  . Alcohol use: No  Family History :   Father had Parkinson's   Home Medications:   Prior to Admission medications   Medication Sig Start Date End Date Taking? Authorizing Provider  albuterol (PROVENTIL HFA;VENTOLIN HFA) 108 (90 Base) MCG/ACT inhaler Inhale into the lungs every 3 (three) hours as needed for wheezing or shortness of breath.     [provider]  Amino Acids-Protein Hydrolys (FEEDING SUPPLEMENT, PRO-STAT SUGAR FREE 64,) LIQD Take 30 mLs by mouth 3 (three) times daily with meals. Sugar free    [provider]  ammonium lactate (LAC-HYDRIN) 12 % lotion Apply 1 application topically 2 (two) times daily. Apply topically to bilateral feet    [provider]  benztropine (COGENTIN) 0.5 MG tablet Take 0.5 mg by mouth 2 (two) times daily.    [provider]  docusate sodium (COLACE) 100 MG capsule Take 100 mg by mouth 2 (two) times daily.    [provider]  fluPHENAZine (PROLIXIN) 5 MG tablet Take 3 tablets (15 mg total) by mouth daily. 07/29/19   Bettey Costa, MD  fluPHENAZine (PROLIXIN) 5 MG tablet Take 3 tablets (15 mg total) by mouth at bedtime. 07/28/19   Bettey Costa, MD  hydrocortisone (CORTEF) 10 MG tablet Take 1 tablet (10 mg total) by mouth 2 (two) times daily. 12/30/17   Fritzi Mandes, MD  hydroxychloroquine (PLAQUENIL) 200 MG tablet Take 200 mg by mouth 2 (two) times daily.    [provider]  levothyroxine (SYNTHROID) 200 MCG  tablet Take 200 mcg by mouth daily before breakfast.     [provider]  levothyroxine (SYNTHROID) 50 MCG tablet Take 1 tablet (50 mcg total) by mouth daily before breakfast. 07/29/19   Bettey Costa, MD  methocarbamol (ROBAXIN) 750 MG tablet Take 750 mg by mouth at bedtime.    [provider]  metoprolol tartrate (LOPRESSOR) 50 MG tablet Take 1 tablet (50 mg total) by mouth 2 (two) times daily. 07/28/19   Bettey Costa, MD  Multiple Vitamins-Minerals (MULTIVITAMIN WITH MINERALS) tablet Take 1 tablet by mouth daily.    [provider]  Polyethylene Glycol 3350 (MIRALAX PO) Take 17 g by mouth daily.    [provider]  tamsulosin (FLOMAX) 0.4 MG CAPS capsule Take 0.4 mg by mouth daily.    [provider]  vitamin C (ASCORBIC ACID) 250 MG tablet Take 500 mg by mouth 2 (two) times daily.    [provider]     Allergies:     Allergies  Allergen Reactions  . Methadone   . Penicillins     Tolerates cephalosporins   . Propoxyphene   . Sulfa Antibiotics   . Valproic Acid And Related     Thrombocytopenia     Physical Exam:   Vitals  Blood pressure 140/81, pulse 86, temperature 98.9 F (37.2 C), temperature source Oral, resp. rate 18, height 6\' 2"  (1.88 m), weight (!) 144.5 kg, SpO2 99 %.   1. General middle-aged morbidly obese African-American male lying in hospital bed in no distress,  2. Normal affect and insight, Not Suicidal or Homicidal, Awake Alert, Oriented X 3.  3. No F.N deficits, ALL C.Nerves Intact, Strength 5/5 all 4 extremities, Sensation intact all 4 extremities, Plantars down going.  4. Ears and Eyes appear Normal, Conjunctivae clear, PERRLA. Moist Oral Mucosa.  5. Supple Neck, No JVD, No cervical lymphadenopathy appriciated, No Carotid Bruits.  6. Symmetrical Chest wall movement, Good air movement bilaterally, CTAB.  7. RRR, No Gallops, Rubs or Murmurs, No  Parasternal Heave.  8. Positive Bowel Sounds, Abdomen Soft,  No tenderness, No organomegaly appriciated,No rebound -guarding or rigidity.  9.  No Cyanosis, Normal Skin Turgor, No Skin Rash or Bruise.  10. Good muscle tone,  joints appear normal , no effusions, Normal ROM.  Multiple chronic contractures mostly in upper extremities, both feet also somewhat contracted.  11. No Palpable Lymph Nodes in Neck or Axillae      Data Review:    CBC Recent Labs  Lab 09/11/19 0030  WBC 3.2*  HGB 7.8*  HCT 25.7*  PLT 149*  MCV 88.3  MCH 26.8  MCHC 30.4  RDW 17.5*  LYMPHSABS 1.0  MONOABS 0.4  EOSABS 0.1  BASOSABS 0.0   ------------------------------------------------------------------------------------------------------------------  Chemistries  Recent Labs  Lab 09/11/19 0030  NA 137  K 3.7  CL 104  CO2 26  GLUCOSE 87  BUN 10  CREATININE 0.45*  CALCIUM 8.1*  MG 1.9  AST 21  ALT 11  ALKPHOS 86  BILITOT 0.6   ------------------------------------------------------------------------------------------------------------------ estimated creatinine clearance is 141.3 mL/min (A) (by C-G formula based on SCr of 0.45 mg/dL (L)). ------------------------------------------------------------------------------------------------------------------ No results for input(s): TSH, T4TOTAL, T3FREE, THYROIDAB in the last 72 hours.  Invalid input(s): FREET3  Coagulation profile Recent Labs  Lab 09/11/19 0030  INR 1.0   ------------------------------------------------------------------------------------------------------------------- No results for input(s): DDIMER in the last 72 hours. -------------------------------------------------------------------------------------------------------------------  Cardiac Enzymes No results for input(s): CKMB, TROPONINI, MYOGLOBIN in the last 168 hours.  Invalid input(s): CK ------------------------------------------------------------------------------------------------------------------    Component Value  Date/Time   BNP 386.0 (H) 09/11/2019 0030     ---------------------------------------------------------------------------------------------------------------  Urinalysis    Component Value Date/Time   COLORURINE YELLOW (A) 12/25/2017 1601   APPEARANCEUR HAZY (A) 12/25/2017 1601   APPEARANCEUR Hazy 01/19/2013 1635   LABSPEC 1.018 12/25/2017 1601   LABSPEC 1.008 01/19/2013 1635   PHURINE 5.0 12/25/2017 1601   GLUCOSEU NEGATIVE 12/25/2017 1601   GLUCOSEU Negative 01/19/2013 1635   HGBUR NEGATIVE 12/25/2017 1601   BILIRUBINUR NEGATIVE 12/25/2017 1601   BILIRUBINUR Negative 01/19/2013 1635   KETONESUR NEGATIVE 12/25/2017 1601   PROTEINUR NEGATIVE 12/25/2017 1601   NITRITE POSITIVE (A) 12/25/2017 1601   LEUKOCYTESUR LARGE (A) 12/25/2017 1601   LEUKOCYTESUR Negative 01/19/2013 1635    ----------------------------------------------------------------------------------------------------------------   Imaging Results:    Dg Chest Port 1 View  Result Date: 09/11/2019 CLINICAL DATA:  63 year old male with shortness of breath. Has been treated for pneumonia in the past couple of months. EXAM: PORTABLE CHEST 1 VIEW COMPARISON:  Portable chest and chest CT 07/16/2019 and earlier. FINDINGS: Portable AP semi upright view at 0032 hours. New patchy and indistinct right upper and lower lung opacity when compared to August. Persistent hypo ventilation in the left lower lung appears not significantly changed from that time. Only the left upper lobe appears relatively clear today. Stable cardiac size and mediastinal contours. Visualized tracheal air column is within normal limits. No pneumothorax.  No pleural effusion suspected. IMPRESSION: Progressed multifocal right lung opacity compatible with pneumonia since the August comparisons. Continued abnormal left lung base opacity. No pleural effusion. Electronically Signed   By: Genevie Ann M.D.   On: 09/11/2019 01:01    My personal review of EKG: Rhythm NSR,  RBBB, poor baseline   SARS Coronavirus 2 NEGATIVE POSITIVE Abnormal    09/11/2019      Assessment & Plan:      1.  Acute COVID-19 pneumonitis.  At this time will be admitted to the hospital and placed on  IV steroids and Remdisvir along with IV fluids for hydration, will monitor his inflammatory markers closely, poor candidate for Actemra as he has history of recurrent aspiration pneumonias.  Poor baseline functional status.  Continue monitoring closely.  Note patient is on chronic steroids at baseline.  2.  Chronic dysphagia with recurrent aspiration pneumonia.  For now soft diet, feeding assistance aspiration precautions, keep head of the bed elevated at 65 degrees at all times, speech eval and follow.  3.  History of poor functional status, chair bound status, severe rheumatoid arthritis with severe underlying extraparametal symptoms question undiagnosed Parkinson's.  Continue supportive care for now.  Home medications will be continued with the exception of hydroxychloroquine for now.  Currently on IV steroids.  4.  Chronic diastolic CHF EF 123456 on last echocardiogram in 2018.  Currently compensated.  5.  Morbid obesity.  BMI of 40.  Follow with PCP.  6.  Chronic leukopenia and thrombocytopenia.  Monitor.  7.  BPH.  On Flomax.  8.  Hypothyroidism.  Continue home dose Synthroid.  9.  Schizophrenia.  Currently stable home medications continued.     DVT Prophylaxis Lovenox   AM Labs Ordered, also please review Full Orders  Family Communication: Admission, patients condition and plan of care including tests being ordered have been discussed with the patient and his niece Phineas Real who is the POA who indicate understanding and agree with the plan and Code Status.  Code Status DNR  Likely DC to SNF once better  Condition GUARDED    Consults called: None  Admission status: Inpatient  Time spent in minutes : 35   Lala Lund M.D on 09/11/2019 at 11:26 AM  To page go to  [email or web address] - password [facility]

## 2019-09-11 NOTE — ED Notes (Signed)
Report given to Alinda Money, Therapist, sports.

## 2019-09-11 NOTE — Progress Notes (Signed)
CODE SEPSIS - PHARMACY COMMUNICATION (LATE ENTRY)  **Broad Spectrum Antibiotics should be administered within 1 hour of Sepsis diagnosis**  Time Code Sepsis Called/Page Received: 0119  Antibiotics Ordered: Cefepime/Vanc  Time of 1st antibiotic administration: 0146  Additional action taken by pharmacy: n/a   If necessary, Name of Provider/Nurse Contacted: n/a    Ena Dawley ,PharmD Clinical Pharmacist  09/11/2019  7:10 AM

## 2019-09-11 NOTE — Progress Notes (Signed)
Patient did not have any personal belongings present upon admission

## 2019-09-11 NOTE — ED Notes (Signed)
Pt unable to sign transfer paper d/t mental status

## 2019-09-11 NOTE — Progress Notes (Signed)
PHARMACY -  BRIEF ANTIBIOTIC NOTE   Pharmacy has received consult(s) for Vancomycin and Cefepime from an ED provider.  The patient's profile has been reviewed for ht/wt/allergies/indication/available labs.    One time order(s) placed for Cefepime 2gm and Vancomycin 2500mg   Further antibiotics/pharmacy consults should be ordered by admitting physician if indicated.                       Thank you, Hart Robinsons A 09/11/2019  1:19 AM

## 2019-09-11 NOTE — ED Notes (Signed)
[facility] arrived for transport 

## 2019-09-11 NOTE — Evaluation (Signed)
Clinical/Bedside Swallow Evaluation Patient Details  Name: Jerry [name] MRN: [id number] Date of Birth: 1956-04-30  Today's Date: 09/11/2019 Time: SLP Start Time (ACUTE ONLY): [id number] SLP Stop Time (ACUTE ONLY): 1212 SLP Time Calculation (min) (ACUTE ONLY): 16 min  Past Medical History:  Past Medical History:  Diagnosis Date  . BPH (benign prostatic hyperplasia)   . CHF (congestive heart failure) ([place])   . Chronic pain   . COPD (chronic obstructive pulmonary disease) ([place])   . Hypertension   . Morbid obesity ([place])   . Pressure ulcer   . Rheumatoid arthritis ([place])   . Schizophrenia ([place])   . Thyroid disease    Past Surgical History: No past surgical history on file. HPI:  Pt is a 63 y.o. male admitted with COVID-19 PNA. Per chart, he has been experiencing recurrent PNAs with concern for aspiration. He had been evaluated by SLP initially recommending thin liquids, but most recent evaluations recommended Dys 1 diet and nectar thick liquids (thin liquids not tested) in the setting of declining mentation, oral holding. PMH includes: schizophrenia, severe extrapyramidal symptoms question underlying Parkinson's, chronic dysphagia with aspiration, advanced rheumatoid arthritis with multiple contractures in upper and lower extremities, extremely poor functional status largely bed and chair bound at SNF, BPH, chronic diastolic CHF last EF 123456 on echocardiogram done in 2018, morbid obesity, history of peptic ulcer disease, BPH, hypothyroidism   Assessment / Plan / Recommendation Clinical Impression  Pt is confused but does not present with as much of a cognitively-based dysphagia as is described in his most recent swallow evaluation. At that time he was orally holding boluses and spitting some back out. Neither of these behaviors are observed today. He initiates oral manipulation and transit without cueing and consistently seems to initiate a swallow to palpation. He had no overt s/s of aspiration even  across a three ounce water screen x2. He did however have a single, delayed cough noted several minutes after the timeframe in which these challenges were completed. Question if he could have an esophageal hx that would put him at risk for post-prandial aspiration, although I also suspect that his fluctuating mentation could put him at risk for intermittent prandial aspiration, making careful monitoring during meals and SLP f/u warranted. Will leave him on a soft diet and thin liquids for now.  SLP Visit Diagnosis: Dysphagia, unspecified (R13.10)    Aspiration Risk  Mild aspiration risk;Moderate aspiration risk    Diet Recommendation Dysphagia 3 (Mech soft);Thin liquid   Liquid Administration via: Cup;Straw Medication Administration: Whole meds with puree Supervision: Staff to assist with self feeding;Full supervision/cueing for compensatory strategies Compensations: Slow rate;Small sips/bites;Minimize environmental distractions Postural Changes: Seated upright at 90 degrees;Remain upright for at least 30 minutes after po intake    Other  Recommendations Oral Care Recommendations: Oral care BID   Follow up Recommendations Skilled Nursing facility      Frequency and Duration min 2x/week  2 weeks       Prognosis Prognosis for Safe Diet Advancement: Fair Barriers to Reach Goals: Cognitive deficits      Swallow Study   General HPI: Pt is a 63 y.o. male admitted with COVID-19 PNA. Per chart, he has been experiencing recurrent PNAs with concern for aspiration. He had been evaluated by SLP initially recommending thin liquids, but most recent evaluations recommended Dys 1 diet and nectar thick liquids (thin liquids not tested) in the setting of declining mentation, oral holding. PMH includes: schizophrenia, severe extrapyramidal symptoms question underlying Parkinson's, chronic dysphagia  with aspiration, advanced rheumatoid arthritis with multiple contractures in upper and lower extremities,  extremely poor functional status largely bed and chair bound at SNF, BPH, chronic diastolic CHF last EF 123456 on echocardiogram done in 2018, morbid obesity, history of peptic ulcer disease, BPH, hypothyroidism Type of Study: Bedside Swallow Evaluation Previous Swallow Assessment: see HPI Diet Prior to this Study: Dysphagia 3 (soft);Thin liquids Temperature Spikes Noted: No Respiratory Status: Nasal cannula History of Recent Intubation: No Behavior/Cognition: Alert;Cooperative;Confused;Requires cueing Oral Cavity Assessment: Within Functional Limits Oral Care Completed by SLP: No Oral Cavity - Dentition: Adequate natural dentition Self-Feeding Abilities: Total assist Patient Positioning: Upright in bed Baseline Vocal Quality: Normal Volitional Swallow: Able to elicit    Oral/Motor/Sensory Function Overall Oral Motor/Sensory Function: Within functional limits   Ice Chips Ice chips: Not tested   Thin Liquid Thin Liquid: Within functional limits Presentation: Straw    Nectar Thick Nectar Thick Liquid: Within functional limits Presentation: Straw   Honey Thick Honey Thick Liquid: Not tested   Puree Puree: Within functional limits Presentation: Spoon   Solid     Solid: Within functional limits      Venita Sheffield Janyce Ellinger 09/11/2019,12:19 PM   Pollyann Glen, M.A. [place] Acute Environmental education officer [phone] Office [phone]

## 2019-09-11 NOTE — Progress Notes (Signed)
His BMI is 41. His baseline hgb has always hovered around 7.8. Ok with Dr Candiss Norse to increase to Lovenox 0.5mg /kg/day.  Lovenox 70mg  SQ qday  Onnie Boer, PharmD, BCIDP, AAHIVP, CPP Infectious Disease Pharmacist 09/11/2019 10:45 AM

## 2019-09-11 NOTE — ED Notes (Signed)
Report given to [facility]. 

## 2019-09-11 NOTE — ED Notes (Signed)
Jerry [name] HCPOA/niece [phone]

## 2019-09-12 LAB — COMPREHENSIVE METABOLIC PANEL
ALT: 15 U/L (ref 0–44)
AST: 22 U/L (ref 15–41)
Albumin: 2.6 g/dL — ABNORMAL LOW (ref 3.5–5.0)
Alkaline Phosphatase: 81 U/L (ref 38–126)
Anion gap: 9 (ref 5–15)
BUN: 15 mg/dL (ref 8–23)
CO2: 23 mmol/L (ref 22–32)
Calcium: 8.3 mg/dL — ABNORMAL LOW (ref 8.9–10.3)
Chloride: 110 mmol/L (ref 98–111)
Creatinine, Ser: 0.51 mg/dL — ABNORMAL LOW (ref 0.61–1.24)
GFR calc Af Amer: >60 mL/min (ref >60)
GFR calc non Af Amer: >60 mL/min (ref >60)
Glucose, Bld: 115 mg/dL — ABNORMAL HIGH (ref 70–99)
Potassium: 3.6 mmol/L (ref 3.5–5.1)
Sodium: 142 mmol/L (ref 135–145)
Total Bilirubin: 0.7 mg/dL (ref 0.3–1.2)
Total Protein: 6.9 g/dL (ref 6.5–8.1)

## 2019-09-12 LAB — BRAIN NATRIURETIC PEPTIDE: B Natriuretic Peptide: 650.2 pg/mL — ABNORMAL HIGH (ref 0.0–100.0)

## 2019-09-12 LAB — CBC WITH DIFFERENTIAL/PLATELET
Abs Immature Granulocytes: 0.02 10*3/uL (ref 0.00–0.07)
Basophils Absolute: 0 10*3/uL (ref 0.0–0.1)
Basophils Relative: 0 %
Eosinophils Absolute: 0 10*3/uL (ref 0.0–0.5)
Eosinophils Relative: 0 %
HCT: 26.1 % — ABNORMAL LOW (ref 39.0–52.0)
Hemoglobin: 7.7 g/dL — ABNORMAL LOW (ref 13.0–17.0)
Immature Granulocytes: 1 %
Lymphocytes Relative: 26 %
Lymphs Abs: 0.9 10*3/uL (ref 0.7–4.0)
MCH: 26.6 pg (ref 26.0–34.0)
MCHC: 29.5 g/dL — ABNORMAL LOW (ref 30.0–36.0)
MCV: 90 fL (ref 80.0–100.0)
Monocytes Absolute: 0.5 10*3/uL (ref 0.1–1.0)
Monocytes Relative: 15 %
Neutro Abs: 2 10*3/uL (ref 1.7–7.7)
Neutrophils Relative %: 58 %
Platelets: 169 10*3/uL (ref 150–400)
RBC: 2.9 MIL/uL — ABNORMAL LOW (ref 4.22–5.81)
RDW: 17.4 % — ABNORMAL HIGH (ref 11.5–15.5)
WBC: 3.4 10*3/uL — ABNORMAL LOW (ref 4.0–10.5)
nRBC: 0.6 % — ABNORMAL HIGH (ref 0.0–0.2)

## 2019-09-12 LAB — IRON AND TIBC
Iron: 22 ug/dL — ABNORMAL LOW (ref 45–182)
Saturation Ratios: 10 % — ABNORMAL LOW (ref 17.9–39.5)
TIBC: 229 ug/dL — ABNORMAL LOW (ref 250–450)
UIBC: 207 ug/dL

## 2019-09-12 LAB — TSH: TSH: 0.098 u[IU]/mL — ABNORMAL LOW (ref 0.350–4.500)

## 2019-09-12 LAB — RETICULOCYTES
Immature Retic Fract: 18.4 % — ABNORMAL HIGH (ref 2.3–15.9)
RBC.: 2.85 MIL/uL — ABNORMAL LOW (ref 4.22–5.81)
Retic Count, Absolute: 28.8 10*3/uL (ref 19.0–186.0)
Retic Ct Pct: 1 % (ref 0.4–3.1)

## 2019-09-12 LAB — VITAMIN B12: Vitamin B-12: 346 pg/mL (ref 180–914)

## 2019-09-12 LAB — FERRITIN: Ferritin: 122 ng/mL (ref 24–336)

## 2019-09-12 LAB — FOLATE: Folate: 12 ng/mL (ref >5.9)

## 2019-09-12 LAB — D-DIMER, QUANTITATIVE: D-Dimer, Quant: 4.62 ug/mL-FEU — ABNORMAL HIGH (ref 0.00–0.50)

## 2019-09-12 MED ORDER — LEVOTHYROXINE SODIUM 75 MCG PO TABS: 200.0000 ug | ORAL_TABLET | Freq: Every day | ORAL | Status: DC

## 2019-09-12 MED ORDER — LORAZEPAM 0.5 MG PO TABS
1.0000 mg | ORAL_TABLET | Freq: Two times a day (BID) | ORAL | Status: DC | PRN
  Administered 2019-09-13: 1 mg via ORAL
  Filled 2019-09-12: qty 2

## 2019-09-12 MED ORDER — FUROSEMIDE 10 MG/ML IJ SOLN
40.0000 mg | Freq: Once | INTRAMUSCULAR | Status: AC
  Administered 2019-09-12: 40 mg via INTRAVENOUS
  Filled 2019-09-12: qty 4

## 2019-09-12 MED ORDER — GUAIFENESIN-DM 100-10 MG/5ML PO SYRP
10.0000 mL | ORAL_SOLUTION | ORAL | Status: DC | PRN
  Administered 2019-09-12 – 2019-09-14 (×3): 10 mL via ORAL
  Filled 2019-09-12 (×3): qty 10

## 2019-09-12 NOTE — Evaluation (Signed)
Occupational Therapy Evaluation Patient Details Name: Jerry [name] MRN: [id number] DOB: [birth year] Today's Date: 09/12/2019    History of Present Illness Jerry [name]  is a 63 y.o. male, with history of schizophrenia, severe extrapyramidal symptoms question underlying Parkinson's, chronic dysphagia with aspiration, advanced rheumatoid arthritis with multiple contractures in upper and lower extremities, extremely poor functional status largely bed and chair bound at SNF, BPH, chronic diastolic CHF,morbid obesity, history of peptic ulcer disease, BPH, hypothyroidism, chronic aspiration pneumonias  to Ed for increased SOB, Positive for covid -19.   Clinical Impression   This 63 y/o male presents with the above. PTA pt residing in SNF, mostly bed/wheelchair bound and requiring assist for ADL. Pt requiring +2 totalA for bed mobility this session; providing totalA for pericare and assisting to change bed linens after incontinent BM. Pt with notable confusion/?paranoia, telling therapist he needed to call his lawyer because someone was taking something from him. Pt also with notable bil hand contractures (as pt with baseline RA). Pt on 2L O2 during session with SpO2 >90%, however pt with increased DOE with HOB flat during bed mobility. He will benefit from continued acute OT therapy and recommend pt return to SNF at time of discharge. Will continue to follow acutely.     Follow Up Recommendations  SNF    Equipment Recommendations  Other (comment)(defer to next venue)           Precautions / Restrictions Precautions Precautions: Fall Precaution Comments: incontinent Restrictions Weight Bearing Restrictions: No      Mobility Bed Mobility Overal bed mobility: Needs Assistance Bed Mobility: Rolling Rolling: Total assist;+2 for physical assistance;+2 for safety/equipment         General bed mobility comments: cues to reach to rails, total assist to roll to each side for bed to be changed and  washed up from BM.  Transfers                 General transfer comment: will need a lift    Balance                                           ADL either performed or assessed with clinical judgement   ADL Overall ADL's : Needs assistance/impaired                                       General ADL Comments: currently totalA     Vision         Perception     Praxis      Pertinent Vitals/Pain Pain Assessment: Faces Faces Pain Scale: No hurt     Hand Dominance     Extremity/Trunk Assessment Upper Extremity Assessment Upper Extremity Assessment: Generalized weakness;RUE deficits/detail;LUE deficits/detail RUE Deficits / Details: contractures in bil hands (pt with hx of RA) LUE Deficits / Details: contractures in bil hands (pt with hx of RA)   Lower Extremity Assessment Lower Extremity Assessment: Defer to PT evaluation RLE Deficits / Details: decreased dorsiflexion, leg tends to add and cross left at ankles, some hip flexion active LLE Deficits / Details: similar to right but under left leg.   Cervical / Trunk Assessment Cervical / Trunk Assessment: (very stiff trunk for rolling, large abd habitus)   Communication Communication Communication: Other (comment)(some mumbled speech)  Cognition Arousal/Alertness: Awake/alert Behavior During Therapy: WFL for tasks assessed/performed Overall Cognitive Status: No family/caregiver present to determine baseline cognitive functioning                                 General Comments: speaking about other people bothering him and needing to talk to his lawyer, difficulty understanding   General Comments  NT    Exercises     Shoulder Instructions      Home Living Family/patient expects to be discharged to:: Skilled nursing facility                                        Prior Functioning/Environment Level of Independence: Needs assistance  Gait  / Transfers Assistance Needed: non ambulatory, WC /bed bound ADL's / Homemaking Assistance Needed: requires assist            OT Problem List: Decreased strength;Decreased range of motion;Decreased activity tolerance;Impaired balance (sitting and/or standing);Decreased coordination;Decreased cognition;Decreased safety awareness;Pain;Obesity;Decreased knowledge of use of DME or AE;Cardiopulmonary status limiting activity;Impaired UE functional use      OT Treatment/Interventions: Self-care/ADL training;Therapeutic exercise;Energy conservation;DME and/or AE instruction;Therapeutic activities;Patient/family education;Balance training;Cognitive remediation/compensation    OT Goals(Current goals can be found in the care plan section) Acute Rehab OT Goals Patient Stated Goal: I need to be cleaned up OT Goal Formulation: With patient Time For Goal Achievement: 09/26/19 Potential to Achieve Goals: Fair  OT Frequency: Min 1X/week   Barriers to D/C:            Co-evaluation PT/OT/SLP Co-Evaluation/Treatment: Yes Reason for Co-Treatment: For patient/therapist safety PT goals addressed during session: Mobility/safety with mobility OT goals addressed during session: ADL's and self-care      AM-PAC OT "6 Clicks" Daily Activity     Outcome Measure Help from another person eating meals?: A Lot Help from another person taking care of personal grooming?: Total Help from another person toileting, which includes using toliet, bedpan, or urinal?: Total Help from another person bathing (including washing, rinsing, drying)?: Total Help from another person to put on and taking off regular upper body clothing?: Total Help from another person to put on and taking off regular lower body clothing?: Total 6 Click Score: 7   End of Session Nurse Communication: Mobility status  Activity Tolerance: Patient tolerated treatment well Patient left: in bed;with call bell/phone within reach  OT Visit  Diagnosis: Other symptoms and signs involving cognitive function;Muscle weakness (generalized) (M62.81);Other abnormalities of gait and mobility (R26.89)                Time: [id number] OT Time Calculation (min): 19 min Charges:  OT General Charges $OT Visit: 1 Visit OT Evaluation $OT Eval Moderate Complexity: [place], OT [employer] Pager [phone] Office [place] 09/12/2019, 4:57 PM

## 2019-09-12 NOTE — Progress Notes (Signed)
PROGRESS NOTE                                                                                                                                                                                                             Patient Demographics:    Jerry [name], is a 63 y.o. male, DOB - Aug 07, 1956, [id number]  Outpatient Primary MD for the patient is [name], MD    LOS - 1  Admit date - 09/11/2019    CC - SOB     Brief Narrative -   Jerry [name]  is a 63 y.o. male, with history of schizophrenia, severe extrapyramidal symptoms question underlying Parkinson's, chronic dysphagia with aspiration, advanced rheumatoid arthritis with multiple contractures in upper and lower extremities, extremely poor functional status largely bed and chair bound at SNF, BPH, chronic diastolic CHF last EF 92% on echocardiogram done in 2018, morbid obesity, history of peptic ulcer disease, BPH, hypothyroidism, chronic aspiration pneumonias with baseline DNR status.  With above history who has been struggling with recurrent aspiration pneumonias for several months, in the past he has decided not to get a feeding tube and to focus on being comfortable if he declines, lives in a nursing home and for the last few days was noted to be slightly short of breath, further work-up showed that he was COVID-19 positive he was then sent to [facility] ER from where he was transferred to [facility] for COVID-19 pneumonitis and further care.  Patient besides mild cough and shortness of breath is symptom-free, denies any headache chest pain or palpitations, no gastroenteritis no focal weakness.    Subjective:    [name] today has, No headache, No chest pain, No abdominal pain - No Nausea, No new weakness tingling or numbness, no Cough - SOB.    Assessment  & Plan :   1.  Acute COVID-19 pneumonitis.  At this time will be admitted to the hospital and placed on IV  steroids and Remdisvir, has been adequately hydrated with IV fluids, clinically improving, will monitor his inflammatory markers closely, poor candidate for Actemra as he has history of recurrent aspiration pneumonias.  Poor baseline functional status.  Continue monitoring closely. Note patient is on chronic steroids at baseline.   COVID-19 Labs  Recent Labs    09/11/19 0030 09/11/19 0044 09/12/19 0000  DDIMER  --   --  4.62*  FERRITIN 113  --   --   LDH 260*  --   --   CRP  --  10.7*  --     Lab Results  Component Value Date   SARSCOV2NAA POSITIVE (A) 09/11/2019   SARSCOV2NAA NOT DETECTED 07/27/2019   SARSCOV2NAA NOT DETECTED 07/21/2019   SARSCOV2NAA REJ5 07/19/2019     SpO2: 94 % O2 Flow Rate (L/min): 2 L/min  Hepatic Function Latest Ref Rng & Units 09/12/2019 09/11/2019 08/25/2019  Total Protein 6.5 - 8.1 g/dL 6.9 7.1 6.9  Albumin 3.5 - 5.0 g/dL 2.6(L) 2.9(L) 2.7(L)  AST 15 - 41 U/L 22 21 12(L)  ALT 0 - 44 U/L '15 11 11  ' Alk Phosphatase 38 - 126 U/L 81 86 110  Total Bilirubin 0.3 - 1.2 mg/dL 0.7 0.6 0.3        Component Value Date/Time   BNP 650.2 (H) 09/12/2019 0000      2.  Chronic dysphagia with recurrent aspiration pneumonia.  For now soft diet, feeding assistance aspiration precautions, keep head of the bed elevated at 65 degrees at all times, speech eval and follow.  3.  History of poor functional status, chair bound status, severe rheumatoid arthritis with severe underlying extraparametal symptoms question undiagnosed Parkinson's.  Continue supportive care for now.  Home medications will be continued with the exception of hydroxychloroquine for now.  Currently on IV steroids.  4.  Chronic diastolic CHF EF 94% on last echocardiogram in 2018.  Currently compensated.  5.  Morbid obesity.  BMI of 40.  Follow with PCP.  6.  Chronic leukopenia and thrombocytopenia.  Monitor.  7.  BPH.  On Flomax.  8.  Hypothyroidism.    TSH is suppressed, will hold  Synthroid for the next few days and then lower the dose upon discharge.  9.  Schizophrenia.  Currently stable home medications continued.  10. AOCD - check anemia panel, type screen done.   11. HTN - placed on Norvasc.    Condition - Fair  Family Communication  : Niece who is the POA at the time of admission  Code Status :  DNR  Diet :   Diet Order            DIET SOFT Room service appropriate? Yes; Fluid consistency: Thin  Diet effective now               Disposition Plan  :  SNF  Consults  :  None  Procedures  :   PUD Prophylaxis :    DVT Prophylaxis  :  Lovenox    Lab Results  Component Value Date   PLT 169 09/12/2019    Inpatient Medications  Scheduled Meds: . benztropine  0.5 mg Oral BID  . docusate sodium  100 mg Oral BID  . enoxaparin (LOVENOX) injection  70 mg Subcutaneous Q24H  . feeding supplement (PRO-STAT SUGAR FREE 64)  30 mL Oral TID WC  . fluPHENAZine  15 mg Oral Daily  . fluPHENAZine  15 mg Oral QHS  . [START ON 09/19/2019] levothyroxine  200 mcg Oral Q0600  . methocarbamol  750 mg Oral QHS  . methylPREDNISolone (SOLU-MEDROL) injection  60 mg Intravenous Daily  . metoprolol tartrate  50 mg Oral BID  . multivitamin with minerals  1 tablet Oral Daily  . polyethylene glycol  17 g Oral Daily  . sodium chloride flush  3 mL Intravenous Q12H  . tamsulosin  0.4 mg Oral Daily  . vitamin C  500 mg Oral BID   Continuous Infusions: . remdesivir 100 mg in NS 250 mL     PRN Meds:.acetaminophen, albuterol  Antibiotics  :    Anti-infectives (From admission, onward)   Start     Dose/Rate Route Frequency Ordered Stop   09/12/19 1000  remdesivir 100 mg in sodium chloride 0.9 % 250 mL IVPB     100 mg 500 mL/hr over 30 Minutes Intravenous Every 24 hours 09/11/19 1149 09/16/19 0959   09/11/19 1300  remdesivir 200 mg in sodium chloride 0.9 % 250 mL IVPB     200 mg 500 mL/hr over 30 Minutes Intravenous Once 09/11/19 1149 09/11/19 1900       Time  Spent in minutes  30   Lala Lund M.D on 09/12/2019 at 7:29 AM  To page go to [email or web address] - password [email or web address]  [facility] -  Office  [phone]   See all Orders from today for further details    Objective:   Vitals:   09/11/19 1629 09/11/19 2151 09/11/19 2212 09/12/19 0442  BP: (!) 153/105 (!) 172/102 (!) 152/95 (!) 147/95  Pulse: (!) 105 90 87 78  Resp: '16 18  18  ' Temp: 97.6 F (36.4 C) 98.2 F (36.8 C)  97.9 F (36.6 C)  TempSrc: Oral Oral  Oral  SpO2: 94% 98% 95% 94%  Weight:      Height:        Wt Readings from Last 3 Encounters:  09/11/19 (!) 144.5 kg  09/11/19 (!) 142 kg  07/16/19 (!) 153.2 kg     Intake/Output Summary (Last 24 hours) at 09/12/2019 0729 Last data filed at 09/12/2019 0515 Gross per 24 hour  Intake 1382.76 ml  Output 1150 ml  Net 232.76 ml     Physical Exam  Awake, minimally confused, in no distress, no new focal deficits  [place].AT,PERRAL Supple Neck,No JVD, No cervical lymphadenopathy appriciated.  Symmetrical Chest wall movement, Good air movement bilaterally, CTAB RRR,No Gallops,Rubs or new Murmurs, No Parasternal Heave +ve B.Sounds, Abd Soft, No tenderness, No organomegaly appriciated, No rebound - guarding or rigidity. No Cyanosis, multiple chronic contractures all 4 extremities.    Data Review:    CBC Recent Labs  Lab 09/11/19 0030 09/11/19 1200 09/12/19 0000  WBC 3.2* 2.0* 3.4*  HGB 7.8* 7.9* 7.7*  HCT 25.7* 26.5* 26.1*  PLT 149* 148* 169  MCV 88.3 89.8 90.0  MCH 26.8 26.8 26.6  MCHC 30.4 29.8* 29.5*  RDW 17.5* 17.4* 17.4*  LYMPHSABS 1.0  --  0.9  MONOABS 0.4  --  0.5  EOSABS 0.1  --  0.0  BASOSABS 0.0  --  0.0    Chemistries  Recent Labs  Lab 09/11/19 0030 09/11/19 1200 09/12/19 0000  NA 137  --  142  K 3.7  --  3.6  CL 104  --  110  CO2 26  --  23  GLUCOSE 87  --  115*  BUN 10  --  15  CREATININE 0.45* 0.49* 0.51*  CALCIUM 8.1*  --  8.3*  MG 1.9  --   --   AST 21  --  22  ALT 11  --   15  ALKPHOS 86  --  81  BILITOT 0.6  --  0.7   ------------------------------------------------------------------------------------------------------------------ Recent Labs    09/11/19 0030  TRIG 45    Lab Results  Component Value Date   HGBA1C 4.9 06/21/2017   ------------------------------------------------------------------------------------------------------------------ Recent Labs    09/12/19 0000  TSH 0.098*    Cardiac Enzymes No results for input(s): CKMB, TROPONINI, MYOGLOBIN in the last 168 hours.  Invalid input(s): CK ------------------------------------------------------------------------------------------------------------------    Component Value Date/Time   BNP 650.2 (H) 09/12/2019 0000    Micro Results Recent Results (from the past 240 hour(s))  SARS Coronavirus 2 by RT PCR (hospital order, performed in [facility] hospital lab) Nasopharyngeal Nasopharyngeal Swab     Status: Abnormal   Collection Time: 09/11/19 12:30 AM   Specimen: Nasopharyngeal Swab  Result Value Ref Range Status   SARS Coronavirus 2 POSITIVE (A) NEGATIVE Final    Comment: RESULT CALLED TO, READ BACK BY AND VERIFIED WITH: KATY FOUST RN AT 9924 ON 09/11/2019 SNG (NOTE) If result is NEGATIVE SARS-CoV-2 target nucleic acids are NOT DETECTED. The SARS-CoV-2 RNA is generally detectable in upper and lower  respiratory specimens during the acute phase of infection. The lowest  concentration of SARS-CoV-2 viral copies this assay can detect is 250  copies / mL. A negative result does not preclude SARS-CoV-2 infection  and should not be used as the sole basis for treatment or other  patient management decisions.  A negative result may occur with  improper specimen collection / handling, submission of specimen other  than nasopharyngeal swab, presence of viral mutation(s) within the  areas targeted by this assay, and inadequate number of viral copies  (<250 copies / mL). A negative result  must be combined with clinical  observations, patient history, and epidemiological information. If result is POSITIVE SARS-CoV-2 target nucleic acids are DETECTED . The SARS-CoV-2 RNA is generally detectable in upper and lower  respiratory specimens during the acute phase of infection.  Positive  results are indicative of active infection with SARS-CoV-2.  Clinical  correlation with patient history and other diagnostic information is  necessary to determine patient infection status.  Positive results do  not rule out bacterial infection or co-infection with other viruses. If result is PRESUMPTIVE POSTIVE SARS-CoV-2 nucleic acids MAY BE PRESENT.   A presumptive positive result was obtained on the submitted specimen  and confirmed on repeat testing.  While 2019 novel coronavirus  (SARS-CoV-2) nucleic acids may be present in the submitted sample  additional confirmatory testing may be necessary for epidemiological  and / or clinical management purposes  to differentiate between  SARS-CoV-2 and other Sarbecovirus currently known to infect humans.  If clinically indicated additional testing with an alternate test  methodology [phone] ) is advised. The SARS-CoV-2 RNA is generally  detectable in upper and lower respiratory specimens during the acute  phase of infection. The expected result is Negative. Fact Sheet for Patients:  StrictlyIdeas.no Fact Sheet for Healthcare Providers: BankingDealers.co.za This test is not yet approved or cleared by the [place] FDA and has been authorized for detection and/or diagnosis of SARS-CoV-2 by FDA under an Emergency Use Authorization (EUA).  This EUA will remain in effect (meaning this test can be used) for the duration of the COVID-19 declaration under Section 564(b)(1) of the Act, 21 U.S.C. section 360bbb-3(b)(1), unless the authorization is terminated or revoked sooner. Performed at [facility], [address] [place],  [address]   Blood Culture (routine x 2)     Status: None (Preliminary result)   Collection Time: 09/11/19 12:30 AM   Specimen: BLOOD  Result Value Ref Range Status   Specimen Description BLOOD LEFT ASSIST CONTROL  Final   Special Requests   Final    BOTTLES DRAWN AEROBIC AND ANAEROBIC Blood Culture  results may not be optimal due to an excessive volume of blood received in culture bottles   Culture   Final    NO GROWTH 1 DAY Performed at [facility], [address] [place], [place] [address]    Report Status PENDING  Incomplete  Blood Culture (routine x 2)     Status: None (Preliminary result)   Collection Time: 09/11/19 12:44 AM   Specimen: BLOOD  Result Value Ref Range Status   Specimen Description BLOOD RIGHT FATTY CASTS  Final   Special Requests   Final    BOTTLES DRAWN AEROBIC AND ANAEROBIC Blood Culture results may not be optimal due to an excessive volume of blood received in culture bottles   Culture   Final    NO GROWTH 1 DAY Performed at [facility], [address] [place], [place] [address]    Report Status PENDING  Incomplete    Radiology Reports Dg Chest Port 1 View  Result Date: 09/11/2019 CLINICAL DATA:  63 year old male with shortness of breath. Has been treated for pneumonia in the past couple of months. EXAM: PORTABLE CHEST 1 VIEW COMPARISON:  Portable chest and chest CT 07/16/2019 and earlier. FINDINGS: Portable AP semi upright view at 0032 hours. New patchy and indistinct right upper and lower lung opacity when compared to August. Persistent hypo ventilation in the left lower lung appears not significantly changed from that time. Only the left upper lobe appears relatively clear today. Stable cardiac size and mediastinal contours. Visualized tracheal air column is within normal limits. No pneumothorax.  No pleural effusion suspected. IMPRESSION: Progressed multifocal right lung opacity  compatible with pneumonia since the August comparisons. Continued abnormal left lung base opacity. No pleural effusion. Electronically Signed   By: Genevie Ann M.D.   On: 09/11/2019 01:01

## 2019-09-12 NOTE — Progress Notes (Signed)
MD Candiss Norse notifed pt has loose non-productive cough as well as newly auscultated expiratory wheeze upper lung lobes. See new order per MD. Patient remains on 02 @ 2L [place] no acute distress noted.

## 2019-09-12 NOTE — Progress Notes (Signed)
PHARMACY - PHYSICIAN COMMUNICATION CRITICAL VALUE ALERT - BLOOD CULTURE IDENTIFICATION (BCID)  Jerry [name] is an 63 y.o. male who presented to [facility] on 09/11/2019 with a chief complaint of COVID-19 pneumonia   Assessment:  80 YOM with COVID-19 pneumonia. 1/4 blood cultures are growing GPC but unable to perform BCID currently. Given no fevers and normal WBC, likely a contaminant  Name of physician (or Provider) Contacted: Dr. Candiss Norse   Current antibiotics: None   Changes to prescribed antibiotics recommended: Monitor off antibiotics  Recommendations accepted by provider  Results for orders placed or performed during the hospital encounter of 08/02/17  Blood Culture ID Panel (Reflexed) (Collected: 08/02/2017 11:04 AM)  Result Value Ref Range   Enterococcus species NOT DETECTED NOT DETECTED   Listeria monocytogenes NOT DETECTED NOT DETECTED   Staphylococcus species DETECTED (A) NOT DETECTED   Staphylococcus aureus (BCID) NOT DETECTED NOT DETECTED   Methicillin resistance DETECTED (A) NOT DETECTED   Streptococcus species NOT DETECTED NOT DETECTED   Streptococcus agalactiae NOT DETECTED NOT DETECTED   Streptococcus pneumoniae NOT DETECTED NOT DETECTED   Streptococcus pyogenes NOT DETECTED NOT DETECTED   Acinetobacter baumannii NOT DETECTED NOT DETECTED   Enterobacteriaceae species NOT DETECTED NOT DETECTED   Enterobacter cloacae complex NOT DETECTED NOT DETECTED   Escherichia coli NOT DETECTED NOT DETECTED   Klebsiella oxytoca NOT DETECTED NOT DETECTED   Klebsiella pneumoniae NOT DETECTED NOT DETECTED   Proteus species NOT DETECTED NOT DETECTED   Serratia marcescens NOT DETECTED NOT DETECTED   Haemophilus influenzae NOT DETECTED NOT DETECTED   Neisseria meningitidis NOT DETECTED NOT DETECTED   Pseudomonas aeruginosa NOT DETECTED NOT DETECTED   Candida albicans NOT DETECTED NOT DETECTED   Candida glabrata NOT DETECTED NOT DETECTED   Candida krusei NOT DETECTED NOT DETECTED   Candida parapsilosis NOT DETECTED NOT DETECTED   Candida tropicalis NOT DETECTED NOT DETECTED    Albertina Parr, PharmD., BCPS Clinical Pharmacist

## 2019-09-12 NOTE — Progress Notes (Signed)
This Probation officer spoke w  Nursing staff at [facility] [employer] to answer some questions related to patient's POC and health history.

## 2019-09-12 NOTE — Evaluation (Signed)
Physical Therapy Evaluation Patient Details Name: Jerry [name] MRN: [id number] DOB: 1955-12-09 Today's Date: 09/12/2019   History of Present Illness  Jerry [name]  is a 63 y.o. male, with history of schizophrenia, severe extrapyramidal symptoms question underlying Parkinson's, chronic dysphagia with aspiration, advanced rheumatoid arthritis with multiple contractures in upper and lower extremities, extremely poor functional status largely bed and chair bound at SNF, BPH, chronic diastolic CHF,morbid obesity, history of peptic ulcer disease, BPH, hypothyroidism, chronic aspiration pneumonias  to Ed for increased SOB, Positive for covid -19.  Clinical Impression  The patient was assisted to roll and change bed and wash up, requiring total assistance. Per notes, patient from LTC, bed/WC bound. Patient presents with significant hand deformities and legs and trunk are very stiff. Will assesss need for further PT intervention next visit as patient is total care. Pt admitted with above diagnosis.  Pt currently with functional limitations due to the deficits listed below (see PT Problem List). Pt will benefit from skilled PT to increase their independence and safety with mobility to allow discharge to the venue listed below.       Follow Up Recommendations No PT follow up(return to LTC)    Equipment Recommendations  None recommended by PT    Recommendations for Other Services       Precautions / Restrictions Precautions Precautions: Fall Precaution Comments: incontinent      Mobility  Bed Mobility Overal bed mobility: Needs Assistance Bed Mobility: Rolling Rolling: Total assist;+2 for physical assistance;+2 for safety/equipment         General bed mobility comments: cues to reach to rails, total assist to roll to each side for bed to be changed and washed up from BM.  Transfers                 General transfer comment: will need a lift  Ambulation/Gait                 Stairs            Wheelchair Mobility    Modified Rankin (Stroke Patients Only)       Balance                                             Pertinent Vitals/Pain Faces Pain Scale: Hurts little more    Home Living Family/patient expects to be discharged to:: Skilled nursing facility                      Prior Function Level of Independence: Needs assistance   Gait / Transfers Assistance Needed: non ambulatory, WC /bed bound           Hand Dominance        Extremity/Trunk Assessment        Lower Extremity Assessment Lower Extremity Assessment: RLE deficits/detail;LLE deficits/detail RLE Deficits / Details: decreased dorsiflexion, leg tends to add and cross left at ankles, some hip flexion active LLE Deficits / Details: similar to right but under left leg.    Cervical / Trunk Assessment Cervical / Trunk Assessment: (very stiff trunk for rolling, large abd habitus)  Communication      Cognition Arousal/Alertness: Awake/alert Behavior During Therapy: WFL for tasks assessed/performed Overall Cognitive Status: No family/caregiver present to determine baseline cognitive functioning  General Comments: speaking about  pother  people bothering him, difficulty understanding.      General Comments General comments (skin integrity, edema, etc.): NT    Exercises     Assessment/Plan    PT Assessment Patient needs continued PT services(tbd if needed)  PT Problem List Decreased strength;Decreased mobility;Impaired tone;Decreased range of motion;Decreased coordination;Decreased knowledge of precautions;Obesity;Decreased safety awareness;Decreased activity tolerance;Decreased skin integrity;Pain       PT Treatment Interventions Functional mobility training;Therapeutic activities    PT Goals (Current goals can be found in the Care Plan section)  Acute Rehab PT Goals Patient Stated Goal:  I need to be cleaned up Time For Goal Achievement: 09/26/19 Potential to Achieve Goals: Fair    Frequency Min 2X/week   Barriers to discharge        Co-evaluation PT/OT/SLP Co-Evaluation/Treatment: Yes Reason for Co-Treatment: For patient/therapist safety PT goals addressed during session: Mobility/safety with mobility OT goals addressed during session: ADL's and self-care       AM-PAC PT "6 Clicks" Mobility  Outcome Measure Help needed turning from your back to your side while in a flat bed without using bedrails?: Total Help needed moving from lying on your back to sitting on the side of a flat bed without using bedrails?: Total Help needed moving to and from a bed to a chair (including a wheelchair)?: Total Help needed standing up from a chair using your arms (e.g., wheelchair or bedside chair)?: Total Help needed to walk in hospital room?: Total Help needed climbing 3-5 steps with a railing? : Total 6 Click Score: 6    End of Session Equipment Utilized During Treatment: Oxygen Activity Tolerance: Patient tolerated treatment well Patient left: in bed;with bed alarm set;with call bell/phone within reach Nurse Communication: Mobility status;Need for lift equipment PT Visit Diagnosis: Muscle weakness (generalized) (M62.81)    Time: [id number] PT Time Calculation (min) (ACUTE ONLY): 18 min   Charges:   PT Evaluation $PT Eval Low Complexity: [place] [facility] Acute Rehabilitation Services Pager [phone] Office [phone]   Claretha Cooper 09/12/2019, 4:47 PM

## 2019-09-12 NOTE — Progress Notes (Signed)
  Speech Language Pathology Treatment: Dysphagia  Patient Details Name: Jerry [name] MRN: [id number] DOB: [birth year] Today's Date: 09/12/2019 Time: [id number] SLP Time Calculation (min) (ACUTE ONLY): 14 min  Assessment / Plan / Recommendation Clinical Impression  Pt seen with lunch meal, pt needed assistance self feeding appropriately due to cognitive impairment, but oral phase, mastication and use of straw WNL. No signs of aspiration throughout. Pt is easily short of breath. Agree with initial assessment that pts function may fluctuate. Supervision needed. Pt may continue current diet.   HPI HPI: Pt is a 63 y.o. male admitted with COVID-19 PNA. Per chart, he has been experiencing recurrent PNAs with concern for aspiration. He had been evaluated by SLP initially recommending thin liquids, but most recent evaluations recommended Dys 1 diet and nectar thick liquids (thin liquids not tested) in the setting of declining mentation, oral holding. PMH includes: schizophrenia, severe extrapyramidal symptoms question underlying Parkinson's, chronic dysphagia with aspiration, advanced rheumatoid arthritis with multiple contractures in upper and lower extremities, extremely poor functional status largely bed and chair bound at SNF, BPH, chronic diastolic CHF last EF [id number] on echocardiogram done in 2018, morbid obesity, history of peptic ulcer disease, BPH, hypothyroidism      SLP Plan  Continue with current plan of care       Recommendations  Diet recommendations: Dysphagia 3 (mechanical soft);Thin liquid Liquids provided via: Cup;Straw Medication Administration: Whole meds with puree Supervision: Full supervision/cueing for compensatory strategies;Staff to assist with self feeding Compensations: Slow rate;Small sips/bites;Minimize environmental distractions Postural Changes and/or Swallow Maneuvers: Seated upright 90 degrees                Follow up Recommendations: Skilled Nursing facility SLP  Visit Diagnosis: Dysphagia, unspecified (R13.10) Plan: Continue with current plan of care       GO               Jerry Baltimore, MA [place] Pager [phone] Office [phone]  Jerry [name] 09/12/2019, 1:46 PM

## 2019-09-12 NOTE — Plan of Care (Signed)
Continue with POC, Remdesivir TX per MD, monitor 02 requirements

## 2019-09-13 LAB — CBC WITH DIFFERENTIAL/PLATELET
Abs Immature Granulocytes: 0.08 10*3/uL — ABNORMAL HIGH (ref 0.00–0.07)
Basophils Absolute: 0 10*3/uL (ref 0.0–0.1)
Basophils Relative: 0 %
Eosinophils Absolute: 0 10*3/uL (ref 0.0–0.5)
Eosinophils Relative: 0 %
HCT: 25.5 % — ABNORMAL LOW (ref 39.0–52.0)
Hemoglobin: 7.6 g/dL — ABNORMAL LOW (ref 13.0–17.0)
Immature Granulocytes: 2 %
Lymphocytes Relative: 27 %
Lymphs Abs: 1.2 10*3/uL (ref 0.7–4.0)
MCH: 26.7 pg (ref 26.0–34.0)
MCHC: 29.8 g/dL — ABNORMAL LOW (ref 30.0–36.0)
MCV: 89.5 fL (ref 80.0–100.0)
Monocytes Absolute: 0.6 10*3/uL (ref 0.1–1.0)
Monocytes Relative: 12 %
Neutro Abs: 2.7 10*3/uL (ref 1.7–7.7)
Neutrophils Relative %: 59 %
Platelets: 197 10*3/uL (ref 150–400)
RBC: 2.85 MIL/uL — ABNORMAL LOW (ref 4.22–5.81)
RDW: 17.4 % — ABNORMAL HIGH (ref 11.5–15.5)
WBC: 4.6 10*3/uL (ref 4.0–10.5)
nRBC: 0.9 % — ABNORMAL HIGH (ref 0.0–0.2)

## 2019-09-13 LAB — D-DIMER, QUANTITATIVE: D-Dimer, Quant: 4.36 ug/mL-FEU — ABNORMAL HIGH (ref 0.00–0.50)

## 2019-09-13 LAB — COMPREHENSIVE METABOLIC PANEL
ALT: 16 U/L (ref 0–44)
AST: 24 U/L (ref 15–41)
Albumin: 2.6 g/dL — ABNORMAL LOW (ref 3.5–5.0)
Alkaline Phosphatase: 81 U/L (ref 38–126)
Anion gap: 8 (ref 5–15)
BUN: 24 mg/dL — ABNORMAL HIGH (ref 8–23)
CO2: 26 mmol/L (ref 22–32)
Calcium: 8 mg/dL — ABNORMAL LOW (ref 8.9–10.3)
Chloride: 104 mmol/L (ref 98–111)
Creatinine, Ser: 0.47 mg/dL — ABNORMAL LOW (ref 0.61–1.24)
GFR calc Af Amer: >60 mL/min (ref >60)
GFR calc non Af Amer: >60 mL/min (ref >60)
Glucose, Bld: 99 mg/dL (ref 70–99)
Potassium: 3.6 mmol/L (ref 3.5–5.1)
Sodium: 138 mmol/L (ref 135–145)
Total Bilirubin: 0.4 mg/dL (ref 0.3–1.2)
Total Protein: 6.6 g/dL (ref 6.5–8.1)

## 2019-09-13 LAB — BRAIN NATRIURETIC PEPTIDE: B Natriuretic Peptide: 502.3 pg/mL — ABNORMAL HIGH (ref 0.0–100.0)

## 2019-09-13 MED ORDER — POTASSIUM CHLORIDE CRYS ER 20 MEQ PO TBCR
40.0000 meq | EXTENDED_RELEASE_TABLET | Freq: Once | ORAL | Status: AC
  Administered 2019-09-13: 40 meq via ORAL
  Filled 2019-09-13: qty 2

## 2019-09-13 MED ORDER — FUROSEMIDE 10 MG/ML IJ SOLN
40.0000 mg | Freq: Once | INTRAMUSCULAR | Status: AC
  Administered 2019-09-13: 40 mg via INTRAVENOUS
  Filled 2019-09-13: qty 4

## 2019-09-13 MED ORDER — AMLODIPINE BESYLATE 10 MG PO TABS
10.0000 mg | ORAL_TABLET | Freq: Every day | ORAL | Status: DC
  Administered 2019-09-13 – 2019-09-16 (×4): 10 mg via ORAL
  Filled 2019-09-13 (×4): qty 1

## 2019-09-13 NOTE — Progress Notes (Signed)
Bed in chair position at this time for afternoon meal, pt refusing to use lift to get OOB at this time. Will attempt later this shift

## 2019-09-13 NOTE — Progress Notes (Signed)
PROGRESS NOTE                                                                                                                                                                                                             Patient Demographics:    Jerry [name], is a 63 y.o. male, DOB - [birth year], AST:[id number]  Outpatient Primary MD for the patient is [name], MD    LOS - 2  Admit date - 09/11/2019    CC - SOB     Brief Narrative -   Jerry [name]  is a 63 y.o. male, with history of schizophrenia, severe extrapyramidal symptoms question underlying Parkinson's, chronic dysphagia with aspiration, advanced rheumatoid arthritis with multiple contractures in upper and lower extremities, extremely poor functional status largely bed and chair bound at SNF, BPH, chronic diastolic CHF last EF 98% on echocardiogram done in 2018, morbid obesity, history of peptic ulcer disease, BPH, hypothyroidism, chronic aspiration pneumonias with baseline DNR status.  With above history who has been struggling with recurrent aspiration pneumonias for several months, in the past he has decided not to get a feeding tube and to focus on being comfortable if he declines, lives in a nursing home and for the last few days was noted to be slightly short of breath, further work-up showed that he was COVID-19 positive he was then sent to [facility] ER from where he was transferred to [facility] for COVID-19 pneumonitis and further care.  Patient besides mild cough and shortness of breath is symptom-free, denies any headache chest pain or palpitations, no gastroenteritis no focal weakness.    Subjective:    Jerry [name] today has, No headache, No chest pain, No abdominal pain - No Nausea, No new weakness tingling or numbness, no Cough - SOB.    Assessment  & Plan :   1.  Acute COVID-19 pneumonitis.  At this time will be admitted to the hospital and placed on IV  steroids and Remdisvir, has been adequately hydrated with IV fluids, clinically improving, will monitor his inflammatory markers closely, poor candidate for Actemra as he has history of recurrent aspiration pneumonias.  Poor baseline functional status.  Continue monitoring closely. Note patient is on chronic steroids at baseline.   [place]    09/11/19 0030 09/11/19 0044 09/12/19 0000 09/13/19 0141  DDIMER  --   --  4.62* 4.36*  FERRITIN 113  --  122  --   LDH 260*  --   --   --   CRP  --  10.7*  --   --     Lab Results  Component Value Date   SARSCOV2NAA POSITIVE (A) 09/11/2019   SARSCOV2NAA NOT DETECTED 07/27/2019   SARSCOV2NAA NOT DETECTED 07/21/2019   SARSCOV2NAA REJ5 07/19/2019     SpO2: 93 % O2 Flow Rate (L/min): 2 L/min  Hepatic Function Latest Ref Rng & Units 09/13/2019 09/12/2019 09/11/2019  Total Protein 6.5 - 8.1 g/dL 6.6 6.9 7.1  Albumin 3.5 - 5.0 g/dL 2.6(L) 2.6(L) 2.9(L)  AST 15 - 41 U/L _0 ALT 0 - 44 U/L _1 Alk Phosphatase 38 - 126 U/L 81 81 86  Total Bilirubin 0.3 - 1.2 mg/dL 0.4 0.7 0.6        Component Value Date/Time   BNP 502.3 (H) 09/13/2019 0141      2.  Chronic dysphagia with recurrent aspiration pneumonia.  For now soft diet, feeding assistance aspiration precautions, keep head of the bed elevated at 65 degrees at all times, speech eval and follow.  3.  History of poor functional status, chair bound status, severe rheumatoid arthritis with severe underlying extraparametal symptoms question undiagnosed Parkinson's.  Continue supportive care for now.  Home medications will be continued with the exception of hydroxychloroquine for now.  Currently on IV steroids.  4.  Chronic diastolic CHF EF 39% on last echocardiogram in 2018.  Currently compensated.  5.  Morbid obesity.  BMI of 40.  Follow with PCP.  6.  Chronic leukopenia and thrombocytopenia.  Monitor.  7.  BPH.  On Flomax.  8.  Hypothyroidism.     TSH is suppressed, will hold Synthroid for the next few days and then lower the dose upon discharge.  9.  Schizophrenia.  Currently stable home medications continued.  10. AOCD - check anemia panel, type screen done.   11.  Mild edema.  Dose of IV Lasix with potassium.  Monitor.    12.  Essential hypertension.  On beta-blocker added low-dose Norvasc for better control.         Condition - Fair  Family Communication  : Niece who is the POA at the time of admission  Code Status :  DNR  Diet :   Diet Order            DIET SOFT Room service appropriate? Yes; Fluid consistency: Thin  Diet effective now               Disposition Plan  :  SNF  Consults  :  None  Procedures  :   PUD Prophylaxis :    DVT Prophylaxis  :  Lovenox    Lab Results  Component Value Date   PLT 197 09/13/2019    Inpatient Medications  Scheduled Meds: . amLODipine  10 mg Oral Daily  . benztropine  0.5 mg Oral BID  . docusate sodium  100 mg Oral BID  . enoxaparin (LOVENOX) injection  70 mg Subcutaneous Q24H  . feeding supplement (PRO-STAT SUGAR FREE 64)  30 mL Oral TID WC  . fluPHENAZine  15 mg Oral Daily  . fluPHENAZine  15 mg Oral QHS  . furosemide  40 mg Intravenous Once  . [START ON 09/19/2019] levothyroxine  200 mcg Oral Q0600  . methocarbamol  750 mg Oral QHS  . methylPREDNISolone (SOLU-MEDROL) injection  60 mg  Intravenous Daily  . metoprolol tartrate  50 mg Oral BID  . multivitamin with minerals  1 tablet Oral Daily  . polyethylene glycol  17 g Oral Daily  . potassium chloride  40 mEq Oral Once  . sodium chloride flush  3 mL Intravenous Q12H  . tamsulosin  0.4 mg Oral Daily  . vitamin C  500 mg Oral BID   Continuous Infusions: . remdesivir 100 mg in NS 250 mL 100 mg (09/13/19 0815)   PRN Meds:.acetaminophen, albuterol, guaiFENesin-dextromethorphan, LORazepam  Antibiotics  :    Anti-infectives (From admission, onward)   Start     Dose/Rate Route Frequency Ordered Stop    09/12/19 1000  remdesivir 100 mg in sodium chloride 0.9 % 250 mL IVPB     100 mg 500 mL/hr over 30 Minutes Intravenous Every 24 hours 09/11/19 1149 09/16/19 0959   09/11/19 1300  remdesivir 200 mg in sodium chloride 0.9 % 250 mL IVPB     200 mg 500 mL/hr over 30 Minutes Intravenous Once 09/11/19 1149 09/11/19 1900       Time Spent in minutes  30   Lala Lund M.D on 09/13/2019 at 10:27 AM  To page go to [email or web address] - password [place]  [facility] -  Office  [phone]   See all Orders from today for further details    Objective:   Vitals:   09/12/19 2115 09/12/19 2307 09/13/19 0415 09/13/19 0800  BP: (!) 173/104 137/86 133/61 (!) 147/106  Pulse: 81  71 76  Resp: _0 Temp: 98.4 F (36.9 C)  98.3 F (36.8 C) 98.2 F (36.8 C)  TempSrc: Oral  Oral Oral  SpO2: 98%  92% 93%  Weight:      Height:        Wt Readings from Last 3 Encounters:  09/11/19 (!) 144.5 kg  09/11/19 (!) 142 kg  07/16/19 (!) 153.2 kg     Intake/Output Summary (Last 24 hours) at 09/13/2019 1027 Last data filed at 09/13/2019 1000 Gross per 24 hour  Intake 1256.81 ml  Output 1500 ml  Net -243.19 ml     Physical Exam  Awake Alert,  No new F.N deficits,   [place].AT,PERRAL Supple Neck,No JVD, No cervical lymphadenopathy appriciated.  Symmetrical Chest wall movement, Good air movement bilaterally, CTAB RRR,No Gallops, Rubs or new Murmurs, No Parasternal Heave +ve B.Sounds, Abd Soft, No tenderness, No organomegaly appriciated, No rebound - guarding or rigidity. No Cyanosis, trace edema, multiple chronic contractures all 4 extremities.    Data Review:    CBC Recent Labs  Lab 09/11/19 0030 09/11/19 1200 09/12/19 0000 09/13/19 0141  WBC 3.2* 2.0* 3.4* 4.6  HGB 7.8* 7.9* 7.7* 7.6*  HCT 25.7* 26.5* 26.1* 25.5*  PLT 149* 148* 169 197  MCV 88.3 89.8 90.0 89.5  MCH 26.8 26.8 26.6 26.7  MCHC 30.4 29.8* 29.5* 29.8*  RDW 17.5* 17.4* 17.4* 17.4*  LYMPHSABS 1.0  --  0.9 1.2   MONOABS 0.4  --  0.5 0.6  EOSABS 0.1  --  0.0 0.0  BASOSABS 0.0  --  0.0 0.0    Chemistries  Recent Labs  Lab 09/11/19 0030 09/11/19 1200 09/12/19 0000 09/13/19 0141  NA 137  --  142 138  K 3.7  --  3.6 3.6  CL 104  --  110 104  CO2 26  --  23 26  GLUCOSE 87  --  115* 99  BUN 10  --  15 24*  CREATININE 0.45* 0.49* 0.51* 0.47*  CALCIUM 8.1*  --  8.3* 8.0*  MG 1.9  --   --   --   AST 21  --  22 24  ALT 11  --  15 16  ALKPHOS 86  --  81 81  BILITOT 0.6  --  0.7 0.4   ------------------------------------------------------------------------------------------------------------------ Recent Labs    09/11/19 0030  TRIG 76    Lab Results  Component Value Date   HGBA1C 4.9 06/21/2017   ------------------------------------------------------------------------------------------------------------------ Recent Labs    09/12/19 0000  TSH 0.098*    Cardiac Enzymes No results for input(s): CKMB, TROPONINI, MYOGLOBIN in the last 168 hours.  Invalid input(s): CK ------------------------------------------------------------------------------------------------------------------    Component Value Date/Time   BNP 502.3 (H) 09/13/2019 0141    Micro Results Recent Results (from the past 240 hour(s))  SARS Coronavirus 2 by RT PCR (hospital order, performed in [facility] hospital lab) Nasopharyngeal Nasopharyngeal Swab     Status: Abnormal   Collection Time: 09/11/19 12:30 AM   Specimen: Nasopharyngeal Swab  Result Value Ref Range Status   SARS Coronavirus 2 POSITIVE (A) NEGATIVE Final    Comment: RESULT CALLED TO, READ BACK BY AND VERIFIED WITH: KATY FOUST RN AT 4403 ON 09/11/2019 [facility] (NOTE) If result is NEGATIVE SARS-CoV-2 target nucleic acids are NOT DETECTED. The SARS-CoV-2 RNA is generally detectable in upper and lower  respiratory specimens during the acute phase of infection. The lowest  concentration of SARS-CoV-2 viral copies this assay can detect is 250  copies /  mL. A negative result does not preclude SARS-CoV-2 infection  and should not be used as the sole basis for treatment or other  patient management decisions.  A negative result may occur with  improper specimen collection / handling, submission of specimen other  than nasopharyngeal swab, presence of viral mutation(s) within the  areas targeted by this assay, and inadequate number of viral copies  (<250 copies / mL). A negative result must be combined with clinical  observations, patient history, and epidemiological information. If result is POSITIVE SARS-CoV-2 target nucleic acids are DETECTED . The SARS-CoV-2 RNA is generally detectable in upper and lower  respiratory specimens during the acute phase of infection.  Positive  results are indicative of active infection with SARS-CoV-2.  Clinical  correlation with patient history and other diagnostic information is  necessary to determine patient infection status.  Positive results do  not rule out bacterial infection or co-infection with other viruses. If result is PRESUMPTIVE POSTIVE SARS-CoV-2 nucleic acids MAY BE PRESENT.   A presumptive positive result was obtained on the submitted specimen  and confirmed on repeat testing.  While 2019 novel coronavirus  (SARS-CoV-2) nucleic acids may be present in the submitted sample  additional confirmatory testing may be necessary for epidemiological  and / or clinical management purposes  to differentiate between  SARS-CoV-2 and other Sarbecovirus currently known to infect humans.  If clinically indicated additional testing with an alternate test  methodology [phone] ) is advised. The SARS-CoV-2 RNA is generally  detectable in upper and lower respiratory specimens during the acute  phase of infection. The expected result is Negative. Fact Sheet for Patients:  StrictlyIdeas.no Fact Sheet for Healthcare Providers: BankingDealers.co.za This test is  not yet approved or cleared by the [place] FDA and has been authorized for detection and/or diagnosis of SARS-CoV-2 by FDA under an Emergency Use Authorization (EUA).  This EUA will remain in effect (meaning this test can be used) for  the duration of the COVID-19 declaration under Section 564(b)(1) of the Act, 21 U.S.C. section 360bbb-3(b)(1), unless the authorization is terminated or revoked sooner. Performed at [facility], [address] [place], [place] [address]   Blood Culture (routine x 2)     Status: None (Preliminary result)   Collection Time: 09/11/19 12:30 AM   Specimen: BLOOD  Result Value Ref Range Status   Specimen Description BLOOD LEFT ASSIST CONTROL  Final   Special Requests   Final    BOTTLES DRAWN AEROBIC AND ANAEROBIC Blood Culture results may not be optimal due to an excessive volume of blood received in culture bottles   Culture   Final    NO GROWTH 2 DAYS Performed at [facility], [address], [place], [place] [address]    Report Status PENDING  Incomplete  Blood Culture (routine x 2)     Status: None (Preliminary result)   Collection Time: 09/11/19 12:44 AM   Specimen: BLOOD  Result Value Ref Range Status   Specimen Description BLOOD RIGHT FATTY CASTS  Final   Special Requests   Final    BOTTLES DRAWN AEROBIC AND ANAEROBIC Blood Culture results may not be optimal due to an excessive volume of blood received in culture bottles   Culture  Setup Time   Final    GRAM POSITIVE COCCI AEROBIC BOTTLE ONLY CRITICAL RESULT CALLED TO, READ BACK BY AND VERIFIED WITH: [facility] MANCHERIL AT [place] 09/12/2019 [place] Performed at [facility], [address] [place],  [address]    Culture GRAM POSITIVE COCCI  Final   Report Status PENDING  Incomplete    Radiology Reports Dg Chest Port 1 View  Result Date: 09/11/2019 CLINICAL DATA:  63 year old male with shortness of breath. Has been treated for pneumonia in the past couple of  months. EXAM: PORTABLE CHEST 1 VIEW COMPARISON:  Portable chest and chest CT 07/16/2019 and earlier. FINDINGS: Portable AP semi upright view at 0032 hours. New patchy and indistinct right upper and lower lung opacity when compared to August. Persistent hypo ventilation in the left lower lung appears not significantly changed from that time. Only the left upper lobe appears relatively clear today. Stable cardiac size and mediastinal contours. Visualized tracheal air column is within normal limits. No pneumothorax.  No pleural effusion suspected. IMPRESSION: Progressed multifocal right lung opacity compatible with pneumonia since the August comparisons. Continued abnormal left lung base opacity. No pleural effusion. Electronically Signed   By: Genevie Ann M.D.   On: 09/11/2019 01:01

## 2019-09-13 NOTE — Plan of Care (Signed)
POC discussed with pt and family member [name] (niece) spoken with for update regarding pt care. Pt received IV Lasix x 1 per MD order for continued Bil expiratory wheeze, pt on 02 2L at this time, no acute distress noted. Will continue w POC.

## 2019-09-14 LAB — COMPREHENSIVE METABOLIC PANEL
ALT: 17 U/L (ref 0–44)
AST: 22 U/L (ref 15–41)
Albumin: 2.6 g/dL — ABNORMAL LOW (ref 3.5–5.0)
Alkaline Phosphatase: 77 U/L (ref 38–126)
Anion gap: 9 (ref 5–15)
BUN: 23 mg/dL (ref 8–23)
CO2: 26 mmol/L (ref 22–32)
Calcium: 8.2 mg/dL — ABNORMAL LOW (ref 8.9–10.3)
Chloride: 105 mmol/L (ref 98–111)
Creatinine, Ser: 0.49 mg/dL — ABNORMAL LOW (ref 0.61–1.24)
GFR calc Af Amer: >60 mL/min (ref >60)
GFR calc non Af Amer: >60 mL/min (ref >60)
Glucose, Bld: 107 mg/dL — ABNORMAL HIGH (ref 70–99)
Potassium: 4 mmol/L (ref 3.5–5.1)
Sodium: 140 mmol/L (ref 135–145)
Total Bilirubin: 0.6 mg/dL (ref 0.3–1.2)
Total Protein: 6.6 g/dL (ref 6.5–8.1)

## 2019-09-14 LAB — CBC WITH DIFFERENTIAL/PLATELET
Abs Immature Granulocytes: 0.09 10*3/uL — ABNORMAL HIGH (ref 0.00–0.07)
Basophils Absolute: 0 10*3/uL (ref 0.0–0.1)
Basophils Relative: 0 %
Eosinophils Absolute: 0 10*3/uL (ref 0.0–0.5)
Eosinophils Relative: 0 %
HCT: 25.7 % — ABNORMAL LOW (ref 39.0–52.0)
Hemoglobin: 7.6 g/dL — ABNORMAL LOW (ref 13.0–17.0)
Immature Granulocytes: 2 %
Lymphocytes Relative: 25 %
Lymphs Abs: 1.3 10*3/uL (ref 0.7–4.0)
MCH: 26.3 pg (ref 26.0–34.0)
MCHC: 29.6 g/dL — ABNORMAL LOW (ref 30.0–36.0)
MCV: 88.9 fL (ref 80.0–100.0)
Monocytes Absolute: 0.7 10*3/uL (ref 0.1–1.0)
Monocytes Relative: 14 %
Neutro Abs: 3 10*3/uL (ref 1.7–7.7)
Neutrophils Relative %: 59 %
Platelets: 225 10*3/uL (ref 150–400)
RBC: 2.89 MIL/uL — ABNORMAL LOW (ref 4.22–5.81)
RDW: 17.5 % — ABNORMAL HIGH (ref 11.5–15.5)
WBC: 5.1 10*3/uL (ref 4.0–10.5)
nRBC: 2.1 % — ABNORMAL HIGH (ref 0.0–0.2)

## 2019-09-14 LAB — MAGNESIUM: Magnesium: 2.1 mg/dL (ref 1.7–2.4)

## 2019-09-14 LAB — BRAIN NATRIURETIC PEPTIDE: B Natriuretic Peptide: 290.4 pg/mL — ABNORMAL HIGH (ref 0.0–100.0)

## 2019-09-14 LAB — D-DIMER, QUANTITATIVE: D-Dimer, Quant: 4.81 ug/mL-FEU — ABNORMAL HIGH (ref 0.00–0.50)

## 2019-09-14 MED ORDER — HYDRALAZINE HCL 20 MG/ML IJ SOLN
10.0000 mg | Freq: Four times a day (QID) | INTRAMUSCULAR | Status: DC | PRN
  Administered 2019-09-14: 10 mg via INTRAVENOUS

## 2019-09-14 MED ORDER — HYDRALAZINE HCL 20 MG/ML IJ SOLN
INTRAMUSCULAR | Status: AC
  Filled 2019-09-14: qty 1

## 2019-09-14 MED ORDER — FUROSEMIDE 10 MG/ML IJ SOLN
40.0000 mg | Freq: Once | INTRAMUSCULAR | Status: AC
  Administered 2019-09-14: 40 mg via INTRAVENOUS
  Filled 2019-09-14: qty 4

## 2019-09-14 MED ORDER — FERROUS SULFATE 325 (65 FE) MG PO TABS
325.0000 mg | ORAL_TABLET | Freq: Two times a day (BID) | ORAL | Status: DC
  Administered 2019-09-14 – 2019-09-16 (×5): 325 mg via ORAL
  Filled 2019-09-14 (×5): qty 1

## 2019-09-14 MED ORDER — METHYLPREDNISOLONE SODIUM SUCC 40 MG IJ SOLR: 40.0000 mg | Freq: Every day | INTRAMUSCULAR | Status: DC

## 2019-09-14 MED ORDER — HYDRALAZINE HCL 50 MG PO TABS
50.0000 mg | ORAL_TABLET | Freq: Three times a day (TID) | ORAL | Status: DC
  Administered 2019-09-14 – 2019-09-16 (×6): 50 mg via ORAL
  Filled 2019-09-14 (×6): qty 1

## 2019-09-14 MED ORDER — METOPROLOL TARTRATE 50 MG PO TABS
100.0000 mg | ORAL_TABLET | Freq: Two times a day (BID) | ORAL | Status: DC
  Administered 2019-09-14 – 2019-09-16 (×4): 100 mg via ORAL
  Filled 2019-09-14 (×4): qty 2

## 2019-09-14 MED ORDER — METOPROLOL TARTRATE 50 MG PO TABS
50.0000 mg | ORAL_TABLET | Freq: Once | ORAL | Status: AC
  Administered 2019-09-14: 50 mg via ORAL
  Filled 2019-09-14: qty 1

## 2019-09-14 NOTE — Progress Notes (Signed)
PROGRESS NOTE                                                                                                                                                                                                             Patient Demographics:    Abimael Zeiter, is a 63 y.o. male, DOB - 02/20/1956, UXN:[id number]  Outpatient Primary MD for the patient is [name], MD    LOS - 3  Admit date - 09/11/2019    CC - SOB     Brief Narrative -   Graeson Nouri  is a 63 y.o. male, with history of schizophrenia, severe extrapyramidal symptoms question underlying Parkinson's, chronic dysphagia with aspiration, advanced rheumatoid arthritis with multiple contractures in upper and lower extremities, extremely poor functional status largely bed and chair bound at SNF, BPH, chronic diastolic CHF last EF 25% on echocardiogram done in 2018, morbid obesity, history of peptic ulcer disease, BPH, hypothyroidism, chronic aspiration pneumonias with baseline DNR status.  With above history who has been struggling with recurrent aspiration pneumonias for several months, in the past he has decided not to get a feeding tube and to focus on being comfortable if he declines, lives in a nursing home and for the last few days was noted to be slightly short of breath, further work-up showed that he was COVID-19 positive he was then sent to [facility] ER from where he was transferred to [facility] for COVID-19 pneumonitis and further care.  Patient besides mild cough and shortness of breath is symptom-free, denies any headache chest pain or palpitations, no gastroenteritis no focal weakness.    Subjective:   Patient in bed, appears comfortable, denies any headache, no fever, no chest pain or pressure, no shortness of breath , no abdominal pain. No focal weakness.   Assessment  & Plan :   1.  Acute COVID-19 pneumonitis.  At this time will be admitted to the hospital  and placed on IV steroids and Remdisvir, has been adequately hydrated with IV fluids, clinically improving, start tapering Steroids,  will monitor his inflammatory markers closely, poor candidate for Actemra as he has history of recurrent aspiration pneumonias.  Poor baseline functional status.  Continue monitoring closely. Note patient is on chronic steroids at baseline.   [facility]    09/12/19 0000 09/13/19 0141 09/14/19 0123  DDIMER 4.62* 4.36* 4.81*  FERRITIN 122  --   --     Lab Results  Component Value Date   SARSCOV2NAA POSITIVE (A) 09/11/2019   SARSCOV2NAA NOT DETECTED 07/27/2019   SARSCOV2NAA NOT DETECTED 07/21/2019   SARSCOV2NAA REJ5 07/19/2019     SpO2: 93 % O2 Flow Rate (L/min): 1 L/min  Hepatic Function Latest Ref Rng & Units 09/14/2019 09/13/2019 09/12/2019  Total Protein 6.5 - 8.1 g/dL 6.6 6.6 6.9  Albumin 3.5 - 5.0 g/dL 2.6(L) 2.6(L) 2.6(L)  AST 15 - 41 U/L '22 24 22  ' ALT 0 - 44 U/L '17 16 15  ' Alk Phosphatase 38 - 126 U/L 77 81 81  Total Bilirubin 0.3 - 1.2 mg/dL 0.6 0.4 0.7        Component Value Date/Time   BNP 290.4 (H) 09/14/2019 0123      2.  Chronic dysphagia with recurrent aspiration pneumonia.  For now soft diet, feeding assistance aspiration precautions, keep head of the bed elevated at 65 degrees at all times, speech eval and follow.  3.  History of poor functional status, chair bound status, severe rheumatoid arthritis with severe underlying extraparametal symptoms question undiagnosed Parkinson's.  Continue supportive care for now.  Home medications will be continued with the exception of hydroxychloroquine for now.  Currently on IV steroids.  4.  Chronic diastolic CHF EF 34% on last echocardiogram in 2018.  Currently compensated.  5.  Morbid obesity.  BMI of 40.  Follow with PCP.  6.  Chronic leukopenia and thrombocytopenia.  Monitor.  7.  BPH.  On Flomax.  8.  Hypothyroidism.    TSH is suppressed, will hold  Synthroid for the next few days and then lower the dose upon discharge.  9.  Schizophrenia.  Currently stable home medications continued.  10. AOCD - anemia panel inconclusive but more suggestive of Iron deficiency, place on PO Iron and monitor.   11.  Mild edema.  Dose of IV Lasix with potassium.  Monitor.    12.  Essential hypertension.  On beta-blocker dose doubled and added PO Norvasc for better control.      Condition - Fair  Family Communication  : Niece who is the POA at the time of admission  Code Status :  DNR  Diet :   Diet Order            DIET SOFT Room service appropriate? Yes; Fluid consistency: Thin  Diet effective now               Disposition Plan  :  SNF  Consults  :  None  Procedures  :   PUD Prophylaxis :    DVT Prophylaxis  :  Lovenox    Lab Results  Component Value Date   PLT 225 09/14/2019    Inpatient Medications  Scheduled Meds: . amLODipine  10 mg Oral Daily  . benztropine  0.5 mg Oral BID  . docusate sodium  100 mg Oral BID  . enoxaparin (LOVENOX) injection  70 mg Subcutaneous Q24H  . feeding supplement (PRO-STAT SUGAR FREE 64)  30 mL Oral TID WC  . fluPHENAZine  15 mg Oral Daily  . fluPHENAZine  15 mg Oral QHS  . [START ON 09/19/2019] levothyroxine  200 mcg Oral Q0600  . methocarbamol  750 mg Oral QHS  . methylPREDNISolone (SOLU-MEDROL) injection  60 mg Intravenous Daily  . metoprolol tartrate  50 mg Oral BID  . multivitamin with minerals  1 tablet Oral Daily  .  polyethylene glycol  17 g Oral Daily  . sodium chloride flush  3 mL Intravenous Q12H  . tamsulosin  0.4 mg Oral Daily  . vitamin C  500 mg Oral BID   Continuous Infusions: . remdesivir 100 mg in NS 250 mL 100 mg (09/14/19 0850)   PRN Meds:.acetaminophen, albuterol, guaiFENesin-dextromethorphan, LORazepam  Antibiotics  :    Anti-infectives (From admission, onward)   Start     Dose/Rate Route Frequency Ordered Stop   09/12/19 1000  remdesivir 100 mg in sodium  chloride 0.9 % 250 mL IVPB     100 mg 500 mL/hr over 30 Minutes Intravenous Every 24 hours 09/11/19 1149 09/16/19 0959   09/11/19 1300  remdesivir 200 mg in sodium chloride 0.9 % 250 mL IVPB     200 mg 500 mL/hr over 30 Minutes Intravenous Once 09/11/19 1149 09/11/19 1900       Time Spent in minutes  30   Lala Lund M.D on 09/14/2019 at 9:00 AM  To page go to [email or web address] - password [facility]  [facility] -  Office  [phone]   See all Orders from today for further details    Objective:   Vitals:   09/13/19 2003 09/14/19 0440 09/14/19 0837 09/14/19 0840  BP: 140/70 (!) 149/96 (!) 155/89   Pulse: 81 69 65   Resp:  20 20   Temp: 98.7 F (37.1 C) 98.2 F (36.8 C) 98.5 F (36.9 C)   TempSrc: Oral Oral Oral   SpO2: 94% 91% 92% 93%  Weight:      Height:        Wt Readings from Last 3 Encounters:  09/11/19 (!) 144.5 kg  09/11/19 (!) 142 kg  07/16/19 (!) 153.2 kg     Intake/Output Summary (Last 24 hours) at 09/14/2019 0900 Last data filed at 09/14/2019 0045 Gross per 24 hour  Intake 600 ml  Output 3200 ml  Net -2600 ml     Physical Exam  Awake Alert, No new F.N deficits  [place].AT,PERRAL Supple Neck,No JVD, No cervical lymphadenopathy appriciated.  Symmetrical Chest wall movement, Good air movement bilaterally, CTAB RRR,No Gallops, Rubs or new Murmurs, No Parasternal Heave +ve B.Sounds, Abd Soft, No tenderness, No organomegaly appriciated, No rebound - guarding or rigidity. No Cyanosis, trace edema, multiple chronic contractures all 4 extremities.    Data Review:    CBC Recent Labs  Lab 09/11/19 0030 09/11/19 1200 09/12/19 0000 09/13/19 0141 09/14/19 0123  WBC 3.2* 2.0* 3.4* 4.6 5.1  HGB 7.8* 7.9* 7.7* 7.6* 7.6*  HCT 25.7* 26.5* 26.1* 25.5* 25.7*  PLT 149* 148* 169 197 225  MCV 88.3 89.8 90.0 89.5 88.9  MCH 26.8 26.8 26.6 26.7 26.3  MCHC 30.4 29.8* 29.5* 29.8* 29.6*  RDW 17.5* 17.4* 17.4* 17.4* 17.5*  LYMPHSABS 1.0  --  0.9 1.2 1.3   MONOABS 0.4  --  0.5 0.6 0.7  EOSABS 0.1  --  0.0 0.0 0.0  BASOSABS 0.0  --  0.0 0.0 0.0    Chemistries  Recent Labs  Lab 09/11/19 0030 09/11/19 1200 09/12/19 0000 09/13/19 0141 09/14/19 0123  NA 137  --  142 138 140  K 3.7  --  3.6 3.6 4.0  CL 104  --  110 104 105  CO2 26  --  '23 26 26  ' GLUCOSE 87  --  115* 99 107*  BUN 10  --  15 24* 23  CREATININE 0.45* 0.49* 0.51* 0.47* 0.49*  CALCIUM 8.1*  --  8.3* 8.0* 8.2*  MG 1.9  --   --   --  2.1  AST 21  --  '22 24 22  ' ALT 11  --  '15 16 17  ' ALKPHOS 86  --  81 81 77  BILITOT 0.6  --  0.7 0.4 0.6   ------------------------------------------------------------------------------------------------------------------ No results for input(s): CHOL, HDL, LDLCALC, TRIG, CHOLHDL, LDLDIRECT in the last 72 hours.  Lab Results  Component Value Date   HGBA1C 4.9 06/21/2017   ------------------------------------------------------------------------------------------------------------------ Recent Labs    09/12/19 0000  TSH 0.098*    Cardiac Enzymes No results for input(s): CKMB, TROPONINI, MYOGLOBIN in the last 168 hours.  Invalid input(s): CK ------------------------------------------------------------------------------------------------------------------    Component Value Date/Time   BNP 290.4 (H) 09/14/2019 0123    Micro Results Recent Results (from the past 240 hour(s))  SARS Coronavirus 2 by RT PCR (hospital order, performed in [facility] hospital lab) Nasopharyngeal Nasopharyngeal Swab     Status: Abnormal   Collection Time: 09/11/19 12:30 AM   Specimen: Nasopharyngeal Swab  Result Value Ref Range Status   SARS Coronavirus 2 POSITIVE (A) NEGATIVE Final    Comment: RESULT CALLED TO, READ BACK BY AND VERIFIED WITH: KATY FOUST RN AT 9892 ON 09/11/2019 [facility] (NOTE) If result is NEGATIVE SARS-CoV-2 target nucleic acids are NOT DETECTED. The SARS-CoV-2 RNA is generally detectable in upper and lower  respiratory specimens  during the acute phase of infection. The lowest  concentration of SARS-CoV-2 viral copies this assay can detect is 250  copies / mL. A negative result does not preclude SARS-CoV-2 infection  and should not be used as the sole basis for treatment or other  patient management decisions.  A negative result may occur with  improper specimen collection / handling, submission of specimen other  than nasopharyngeal swab, presence of viral mutation(s) within the  areas targeted by this assay, and inadequate number of viral copies  (<250 copies / mL). A negative result must be combined with clinical  observations, patient history, and epidemiological information. If result is POSITIVE SARS-CoV-2 target nucleic acids are DETECTED . The SARS-CoV-2 RNA is generally detectable in upper and lower  respiratory specimens during the acute phase of infection.  Positive  results are indicative of active infection with SARS-CoV-2.  Clinical  correlation with patient history and other diagnostic information is  necessary to determine patient infection status.  Positive results do  not rule out bacterial infection or co-infection with other viruses. If result is PRESUMPTIVE POSTIVE SARS-CoV-2 nucleic acids MAY BE PRESENT.   A presumptive positive result was obtained on the submitted specimen  and confirmed on repeat testing.  While 2019 novel coronavirus  (SARS-CoV-2) nucleic acids may be present in the submitted sample  additional confirmatory testing may be necessary for epidemiological  and / or clinical management purposes  to differentiate between  SARS-CoV-2 and other Sarbecovirus currently known to infect humans.  If clinically indicated additional testing with an alternate test  methodology [phone] ) is advised. The SARS-CoV-2 RNA is generally  detectable in upper and lower respiratory specimens during the acute  phase of infection. The expected result is Negative. Fact Sheet for Patients:   StrictlyIdeas.no Fact Sheet for Healthcare Providers: BankingDealers.co.za This test is not yet approved or cleared by the [place] FDA and has been authorized for detection and/or diagnosis of SARS-CoV-2 by FDA under an Emergency Use Authorization (EUA).  This EUA will remain in effect (meaning this test can be used) for the duration  of the COVID-19 declaration under Section 564(b)(1) of the Act, 21 U.S.C. section 360bbb-3(b)(1), unless the authorization is terminated or revoked sooner. Performed at [facility], [address] [place], [place] [address]   Blood Culture (routine x 2)     Status: None (Preliminary result)   Collection Time: 09/11/19 12:30 AM   Specimen: BLOOD  Result Value Ref Range Status   Specimen Description BLOOD LEFT ASSIST CONTROL  Final   Special Requests   Final    BOTTLES DRAWN AEROBIC AND ANAEROBIC Blood Culture results may not be optimal due to an excessive volume of blood received in culture bottles   Culture   Final    NO GROWTH 3 DAYS Performed at [facility], [address] [place], [place] [address]    Report Status PENDING  Incomplete  Blood Culture (routine x 2)     Status: None (Preliminary result)   Collection Time: 09/11/19 12:44 AM   Specimen: BLOOD  Result Value Ref Range Status   Specimen Description BLOOD RIGHT FATTY CASTS  Final   Special Requests   Final    BOTTLES DRAWN AEROBIC AND ANAEROBIC Blood Culture results may not be optimal due to an excessive volume of blood received in culture bottles   Culture  Setup Time   Final    GRAM POSITIVE COCCI AEROBIC BOTTLE ONLY CRITICAL RESULT CALLED TO, READ BACK BY AND VERIFIED WITH: [facility] [place] 09/12/2019 [place] Performed at [facility], [address] [place], [place] [address]    Culture GRAM POSITIVE COCCI  Final   Report Status PENDING  Incomplete    Radiology Reports Dg Chest Port 1 View   Result Date: 09/11/2019 CLINICAL DATA:  63 year old male with shortness of breath. Has been treated for pneumonia in the past couple of months. EXAM: PORTABLE CHEST 1 VIEW COMPARISON:  Portable chest and chest CT 07/16/2019 and earlier. FINDINGS: Portable AP semi upright view at 0032 hours. New patchy and indistinct right upper and lower lung opacity when compared to August. Persistent hypo ventilation in the left lower lung appears not significantly changed from that time. Only the left upper lobe appears relatively clear today. Stable cardiac size and mediastinal contours. Visualized tracheal air column is within normal limits. No pneumothorax.  No pleural effusion suspected. IMPRESSION: Progressed multifocal right lung opacity compatible with pneumonia since the August comparisons. Continued abnormal left lung base opacity. No pleural effusion. Electronically Signed   By: Genevie Ann M.D.   On: 09/11/2019 01:01

## 2019-09-14 NOTE — Plan of Care (Signed)
  Problem: Education: Goal: Knowledge of risk factors and measures for prevention of condition will improve Outcome: Progressing   Problem: Coping: Goal: Psychosocial and spiritual needs will be supported Outcome: Progressing   Problem: Respiratory: Goal: Will maintain a patent airway Outcome: Progressing Goal: Complications related to the disease process, condition or treatment will be avoided or minimized Outcome: Progressing   Problem: Education: Goal: Knowledge of General Education information will improve Description: Including pain rating scale, medication(s)/side effects and non-pharmacologic comfort measures Outcome: Progressing   Problem: Education: Goal: Knowledge of General Education information will improve Description: Including pain rating scale, medication(s)/side effects and non-pharmacologic comfort measures Outcome: Progressing   Problem: Health Behavior/Discharge Planning: Goal: Ability to manage health-related needs will improve Outcome: Progressing   Problem: Clinical Measurements: Goal: Ability to maintain clinical measurements within normal limits will improve Outcome: Progressing Goal: Will remain free from infection Outcome: Progressing Goal: Diagnostic test results will improve Outcome: Progressing Goal: Respiratory complications will improve Outcome: Progressing Goal: Cardiovascular complication will be avoided Outcome: Progressing   Problem: Activity: Goal: Risk for activity intolerance will decrease Outcome: Progressing   Problem: Nutrition: Goal: Adequate nutrition will be maintained Outcome: Progressing   Problem: Coping: Goal: Level of anxiety will decrease Outcome: Progressing   Problem: Elimination: Goal: Will not experience complications related to bowel motility Outcome: Progressing Goal: Will not experience complications related to urinary retention Outcome: Progressing   Problem: Pain Managment: Goal: General experience  of comfort will improve Outcome: Progressing   Problem: Safety: Goal: Ability to remain free from injury will improve Outcome: Progressing   Problem: Skin Integrity: Goal: Risk for impaired skin integrity will decrease Outcome: Progressing   

## 2019-09-15 ENCOUNTER — Encounter (HOSPITAL_COMMUNITY): Payer: Self-pay | Admitting: *Deleted

## 2019-09-15 LAB — CULTURE, BLOOD (ROUTINE X 2)

## 2019-09-15 MED ORDER — METHYLPREDNISOLONE SODIUM SUCC 40 MG IJ SOLR
20.0000 mg | Freq: Every day | INTRAMUSCULAR | Status: DC
  Administered 2019-09-15: 20 mg via INTRAVENOUS
  Filled 2019-09-15: qty 1

## 2019-09-15 NOTE — Progress Notes (Addendum)
PROGRESS NOTE                                                                                                                                                                                                             Patient Demographics:    Jerry [name], is a 63 y.o. male, DOB - 02-10-1956, NTI:[id number]  Outpatient Primary MD for the patient is [name], MD    LOS - 4  Admit date - 09/11/2019    CC - SOB     Brief Narrative -   Jerry [name]  is a 63 y.o. male, with history of schizophrenia, severe extrapyramidal symptoms question underlying Parkinson's, chronic dysphagia with aspiration, advanced rheumatoid arthritis with multiple contractures in upper and lower extremities, extremely poor functional status largely bed and chair bound at SNF, BPH, chronic diastolic CHF last EF 86% on echocardiogram done in 2018, morbid obesity, history of peptic ulcer disease, BPH, hypothyroidism, chronic aspiration pneumonias with baseline DNR status.  With above history who has been struggling with recurrent aspiration pneumonias for several months, in the past he has decided not to get a feeding tube and to focus on being comfortable if he declines, lives in a nursing home and for the last few days was noted to be slightly short of breath, further work-up showed that he was COVID-19 positive he was then sent to [facility] ER from where he was transferred to [facility] for COVID-19 pneumonitis and further care.  Patient besides mild cough and shortness of breath is symptom-free, denies any headache chest pain or palpitations, no gastroenteritis no focal weakness.    Subjective:   Patient in bed, appears comfortable, denies any headache, no fever, no chest pain or pressure, no shortness of breath , no abdominal pain. No focal weakness.   Assessment  & Plan :   1.  Acute COVID-19 pneumonitis.  At this time will be admitted to the hospital  and placed on IV steroids and Remdisvir, has been adequately hydrated with IV fluids, clinically improving, start tapering Steroids,  will monitor his inflammatory markers closely, poor candidate for Actemra as he has history of recurrent aspiration pneumonias.  Poor baseline functional status.  Continue monitoring closely. Note patient is on chronic steroids at baseline.  Will finish his IV remdesivir course today and likely discharge soon thereafter tomorrow.   COVID-19  Labs  Recent Labs    09/13/19 0141 09/14/19 0123  DDIMER 4.36* 4.81*    Lab Results  Component Value Date   SARSCOV2NAA POSITIVE (A) 09/11/2019   SARSCOV2NAA NOT DETECTED 07/27/2019   SARSCOV2NAA NOT DETECTED 07/21/2019   SARSCOV2NAA REJ5 07/19/2019     SpO2: 94 % O2 Flow Rate (L/min): 2 L/min  Hepatic Function Latest Ref Rng & Units 09/14/2019 09/13/2019 09/12/2019  Total Protein 6.5 - 8.1 g/dL 6.6 6.6 6.9  Albumin 3.5 - 5.0 g/dL 2.6(L) 2.6(L) 2.6(L)  AST 15 - 41 U/L '22 24 22  ' ALT 0 - 44 U/L '17 16 15  ' Alk Phosphatase 38 - 126 U/L 77 81 81  Total Bilirubin 0.3 - 1.2 mg/dL 0.6 0.4 0.7        Component Value Date/Time   BNP 290.4 (H) 09/14/2019 0123      2.  Chronic dysphagia with recurrent aspiration pneumonia.  For now soft diet, feeding assistance aspiration precautions, keep head of the bed elevated at 65 degrees at all times, speech eval and follow.  3.  History of poor functional status, chair bound status, severe rheumatoid arthritis with severe underlying extraparametal symptoms question undiagnosed Parkinson's.  Continue supportive care for now.  Home medications will be continued with the exception of hydroxychloroquine for now.  Currently on IV steroids.  4.  Chronic diastolic CHF EF 78% on last echocardiogram in 2018.  Currently compensated.  5.  Morbid obesity.  BMI of 40.  Follow with PCP.  6.  Chronic leukopenia and thrombocytopenia.  Monitor.  7.  BPH.  On Flomax.  8.   Hypothyroidism.    TSH is suppressed, will hold Synthroid for the next few days and then lower the dose upon discharge.  9.  Schizophrenia.  Currently stable home medications continued.  10. AOCD - anemia panel inconclusive but more suggestive of Iron deficiency, place on PO Iron and monitor.   11.  Mild edema. Resolved after a dose of IV Lasix with potassium.  Monitor at SNF.    12.  Essential hypertension.  On beta-blocker dose doubled and added PO Norvasc for better control.    79. 1/2 contaminated Coag -ve Staph BC.     Condition - Fair  Family Communication  : Niece who is the POA at the time of admission  Code Status :  DNR  Diet :   Diet Order            DIET SOFT Room service appropriate? Yes; Fluid consistency: Thin  Diet effective now               Disposition Plan  :  SNF  Consults  :  None  Procedures  :   PUD Prophylaxis :    DVT Prophylaxis  :  Lovenox    Lab Results  Component Value Date   PLT 225 09/14/2019    Inpatient Medications  Scheduled Meds: . amLODipine  10 mg Oral Daily  . benztropine  0.5 mg Oral BID  . docusate sodium  100 mg Oral BID  . enoxaparin (LOVENOX) injection  70 mg Subcutaneous Q24H  . feeding supplement (PRO-STAT SUGAR FREE 64)  30 mL Oral TID WC  . ferrous sulfate  325 mg Oral BID WC  . fluPHENAZine  15 mg Oral Daily  . fluPHENAZine  15 mg Oral QHS  . hydrALAZINE  50 mg Oral Q8H  . [START ON 09/19/2019] levothyroxine  200 mcg Oral Q0600  . methocarbamol  750  mg Oral QHS  . methylPREDNISolone (SOLU-MEDROL) injection  20 mg Intravenous Daily  . metoprolol tartrate  100 mg Oral BID  . multivitamin with minerals  1 tablet Oral Daily  . polyethylene glycol  17 g Oral Daily  . sodium chloride flush  3 mL Intravenous Q12H  . tamsulosin  0.4 mg Oral Daily  . vitamin C  500 mg Oral BID   Continuous Infusions: . remdesivir 100 mg in NS 250 mL Stopped (09/14/19 0922)   PRN Meds:.acetaminophen, albuterol,  guaiFENesin-dextromethorphan, hydrALAZINE, LORazepam  Antibiotics  :    Anti-infectives (From admission, onward)   Start     Dose/Rate Route Frequency Ordered Stop   09/12/19 1000  remdesivir 100 mg in sodium chloride 0.9 % 250 mL IVPB     100 mg 500 mL/hr over 30 Minutes Intravenous Every 24 hours 09/11/19 1149 09/16/19 0959   09/11/19 1300  remdesivir 200 mg in sodium chloride 0.9 % 250 mL IVPB     200 mg 500 mL/hr over 30 Minutes Intravenous Once 09/11/19 1149 09/11/19 1900       Time Spent in minutes  30   Lala Lund M.D on 09/15/2019 at 9:34 AM  To page go to [email or web address] - password [facility]  [facility] -  Office  [phone]   See all Orders from today for further details    Objective:   Vitals:   09/14/19 2245 09/15/19 0450 09/15/19 0735 09/15/19 0834  BP: (!) 138/91  (!) 144/87   Pulse: 81  72   Resp:   20   Temp:  98.4 F (36.9 C) 97.9 F (36.6 C)   TempSrc:  Oral Oral   SpO2:   94% 94%  Weight:      Height:        Wt Readings from Last 3 Encounters:  09/11/19 (!) 144.5 kg  09/11/19 (!) 142 kg  07/16/19 (!) 153.2 kg     Intake/Output Summary (Last 24 hours) at 09/15/2019 0934 Last data filed at 09/15/2019 0800 Gross per 24 hour  Intake 1963 ml  Output 1925 ml  Net 38 ml     Physical Exam  Awake Alert, No new F.N deficits  [place].AT,PERRAL Supple Neck,No JVD, No cervical lymphadenopathy appriciated.  Symmetrical Chest wall movement, Good air movement bilaterally, CTAB RRR,No Gallops, Rubs or new Murmurs, No Parasternal Heave +ve B.Sounds, Abd Soft, No tenderness, No organomegaly appriciated, No rebound - guarding or rigidity. No Cyanosis, trace edemae, multiple chronic contractures all 4 extremities.    Data Review:    CBC Recent Labs  Lab 09/11/19 0030 09/11/19 1200 09/12/19 0000 09/13/19 0141 09/14/19 0123  WBC 3.2* 2.0* 3.4* 4.6 5.1  HGB 7.8* 7.9* 7.7* 7.6* 7.6*  HCT 25.7* 26.5* 26.1* 25.5* 25.7*  PLT 149* 148* 169  197 225  MCV 88.3 89.8 90.0 89.5 88.9  MCH 26.8 26.8 26.6 26.7 26.3  MCHC 30.4 29.8* 29.5* 29.8* 29.6*  RDW 17.5* 17.4* 17.4* 17.4* 17.5*  LYMPHSABS 1.0  --  0.9 1.2 1.3  MONOABS 0.4  --  0.5 0.6 0.7  EOSABS 0.1  --  0.0 0.0 0.0  BASOSABS 0.0  --  0.0 0.0 0.0    Chemistries  Recent Labs  Lab 09/11/19 0030 09/11/19 1200 09/12/19 0000 09/13/19 0141 09/14/19 0123  NA 137  --  142 138 140  K 3.7  --  3.6 3.6 4.0  CL 104  --  110 104 105  CO2 26  --  23 26 26  GLUCOSE 87  --  115* 99 107*  BUN 10  --  15 24* 23  CREATININE 0.45* 0.49* 0.51* 0.47* 0.49*  CALCIUM 8.1*  --  8.3* 8.0* 8.2*  MG 1.9  --   --   --  2.1  AST 21  --  '22 24 22  ' ALT 11  --  '15 16 17  ' ALKPHOS 86  --  81 81 77  BILITOT 0.6  --  0.7 0.4 0.6   ------------------------------------------------------------------------------------------------------------------ No results for input(s): CHOL, HDL, LDLCALC, TRIG, CHOLHDL, LDLDIRECT in the last 72 hours.  Lab Results  Component Value Date   HGBA1C 4.9 06/21/2017   ------------------------------------------------------------------------------------------------------------------ No results for input(s): TSH, T4TOTAL, T3FREE, THYROIDAB in the last 72 hours.  Invalid input(s): FREET3  Cardiac Enzymes No results for input(s): CKMB, TROPONINI, MYOGLOBIN in the last 168 hours.  Invalid input(s): CK ------------------------------------------------------------------------------------------------------------------    Component Value Date/Time   BNP 290.4 (H) 09/14/2019 0123    Micro Results Recent Results (from the past 240 hour(s))  SARS Coronavirus 2 by RT PCR (hospital order, performed in [facility] hospital lab) Nasopharyngeal Nasopharyngeal Swab     Status: Abnormal   Collection Time: 09/11/19 12:30 AM   Specimen: Nasopharyngeal Swab  Result Value Ref Range Status   SARS Coronavirus 2 POSITIVE (A) NEGATIVE Final    Comment: RESULT CALLED TO, READ BACK  BY AND VERIFIED WITH: KATY FOUST RN AT 6144 ON 09/11/2019 SNG (NOTE) If result is NEGATIVE SARS-CoV-2 target nucleic acids are NOT DETECTED. The SARS-CoV-2 RNA is generally detectable in upper and lower  respiratory specimens during the acute phase of infection. The lowest  concentration of SARS-CoV-2 viral copies this assay can detect is 250  copies / mL. A negative result does not preclude SARS-CoV-2 infection  and should not be used as the sole basis for treatment or other  patient management decisions.  A negative result may occur with  improper specimen collection / handling, submission of specimen other  than nasopharyngeal swab, presence of viral mutation(s) within the  areas targeted by this assay, and inadequate number of viral copies  (<250 copies / mL). A negative result must be combined with clinical  observations, patient history, and epidemiological information. If result is POSITIVE SARS-CoV-2 target nucleic acids are DETECTED . The SARS-CoV-2 RNA is generally detectable in upper and lower  respiratory specimens during the acute phase of infection.  Positive  results are indicative of active infection with SARS-CoV-2.  Clinical  correlation with patient history and other diagnostic information is  necessary to determine patient infection status.  Positive results do  not rule out bacterial infection or co-infection with other viruses. If result is PRESUMPTIVE POSTIVE SARS-CoV-2 nucleic acids MAY BE PRESENT.   A presumptive positive result was obtained on the submitted specimen  and confirmed on repeat testing.  While 2019 novel coronavirus  (SARS-CoV-2) nucleic acids may be present in the submitted sample  additional confirmatory testing may be necessary for epidemiological  and / or clinical management purposes  to differentiate between  SARS-CoV-2 and other Sarbecovirus currently known to infect humans.  If clinically indicated additional testing with an alternate  test  methodology [phone] ) is advised. The SARS-CoV-2 RNA is generally  detectable in upper and lower respiratory specimens during the acute  phase of infection. The expected result is Negative. Fact Sheet for Patients:  StrictlyIdeas.no Fact Sheet for Healthcare Providers: BankingDealers.co.za This test is not yet approved or cleared by the [place]  FDA and has been authorized for detection and/or diagnosis of SARS-CoV-2 by FDA under an Emergency Use Authorization (EUA).  This EUA will remain in effect (meaning this test can be used) for the duration of the COVID-19 declaration under Section 564(b)(1) of the Act, 21 U.S.C. section 360bbb-3(b)(1), unless the authorization is terminated or revoked sooner. Performed at [facility], [address], [place], [place] [address]   Blood Culture (routine x 2)     Status: None (Preliminary result)   Collection Time: 09/11/19 12:30 AM   Specimen: BLOOD  Result Value Ref Range Status   Specimen Description BLOOD LEFT ASSIST CONTROL  Final   Special Requests   Final    BOTTLES DRAWN AEROBIC AND ANAEROBIC Blood Culture results may not be optimal due to an excessive volume of blood received in culture bottles   Culture   Final    NO GROWTH 4 DAYS Performed at [facility] [address], [place], [place] [address]    Report Status PENDING  Incomplete  Blood Culture (routine x 2)     Status: Abnormal   Collection Time: 09/11/19 12:44 AM   Specimen: BLOOD  Result Value Ref Range Status   Specimen Description   Final    BLOOD RIGHT FATTY CASTS Performed at [facility], [address] [place], [place] [address]    Special Requests   Final    BOTTLES DRAWN AEROBIC AND ANAEROBIC Blood Culture results may not be optimal due to an excessive volume of blood received in culture bottles Performed at [facility], [address] [place], [place] [address]     Culture  Setup Time   Final    GRAM POSITIVE COCCI AEROBIC BOTTLE ONLY CRITICAL RESULT CALLED TO, READ BACK BY AND VERIFIED WITH: [facility] MANCHERIL AT 0751 09/12/2019 SDR Performed at [facility], [address] [place], [place] [address]    Culture (A)  Final    STAPHYLOCOCCUS SPECIES (COAGULASE NEGATIVE) THE SIGNIFICANCE OF ISOLATING THIS ORGANISM FROM A SINGLE SET OF BLOOD CULTURES WHEN MULTIPLE SETS ARE DRAWN IS UNCERTAIN. PLEASE NOTIFY THE MICROBIOLOGY DEPARTMENT WITHIN ONE WEEK IF SPECIATION AND SENSITIVITIES ARE REQUIRED. Performed at [facility], [place] [address] [place],  [address]    Report Status 09/15/2019 FINAL  Final    Radiology Reports Dg Chest Port 1 View  Result Date: 09/11/2019 CLINICAL DATA:  63 year old male with shortness of breath. Has been treated for pneumonia in the past couple of months. EXAM: PORTABLE CHEST 1 VIEW COMPARISON:  Portable chest and chest CT 07/16/2019 and earlier. FINDINGS: Portable AP semi upright view at 0032 hours. New patchy and indistinct right upper and lower lung opacity when compared to August. Persistent hypo ventilation in the left lower lung appears not significantly changed from that time. Only the left upper lobe appears relatively clear today. Stable cardiac size and mediastinal contours. Visualized tracheal air column is within normal limits. No pneumothorax.  No pleural effusion suspected. IMPRESSION: Progressed multifocal right lung opacity compatible with pneumonia since the August comparisons. Continued abnormal left lung base opacity. No pleural effusion. Electronically Signed   By: Genevie Ann M.D.   On: 09/11/2019 01:01

## 2019-09-15 NOTE — TOC Initial Note (Signed)
Transition of Care [facility]) - Initial/Assessment Note    Patient Details  Name: Oryan Maben MRN: [id number] Date of Birth: 1956-02-05  [facility]) CM/SW Contact:    Weston Anna, LCSW Phone Number: 09/15/2019, 2:23 PM  Clinical Narrative:                  CSW spoke with patients daughter, Scarlette Ar , regarding discharge plans. Patient is a long-term resident at [facility]- been there for about 6 years. Plans to return once medically stable- CSW spoke with facility and they are prepared to accept him once ready using his Medicaid benefits.    Expected Discharge Plan: [place]     Patient Goals and CMS Choice        Expected Discharge Plan and Services Expected Discharge Plan: [place]                                              Prior Living Arrangements/Services                       Activities of Daily Living Home Assistive Devices/Equipment: (pt states he does walk sometimes at facility can not state any assistive device) ADL Screening (condition at time of admission) Patient's cognitive ability adequate to safely complete daily activities?: No Is the patient deaf or have difficulty hearing?: No Does the patient have difficulty seeing, even when wearing glasses/contacts?: No Does the patient have difficulty concentrating, remembering, or making decisions?: Yes Patient able to express need for assistance with ADLs?: Yes Does the patient have difficulty dressing or bathing?: Yes Independently performs ADLs?: No Communication: Dependent Is this a change from baseline?: Pre-admission baseline Dressing (OT): Needs assistance Grooming: Dependent Feeding: Needs assistance Bathing: Needs assistance Toileting: Needs assistance In/Out Bed: Needs assistance Walks in Home: Needs assistance Does the patient have difficulty walking or climbing stairs?: Yes Weakness of Legs: Both Weakness of Arms/Hands:  Both  Permission Sought/Granted                  Emotional Assessment              Admission diagnosis:  COVID-19 VIRUS INFECTION  Patient Active Problem List   Diagnosis Date Noted  . COVID-19 virus infection 09/11/2019  . Normocytic anemia 08/25/2019  . Community acquired pneumonia   . DNR (do not resuscitate) discussion   . SOB (shortness of breath) 07/16/2019  . Obesity hypoventilation syndrome ([place])   . Schizophrenia ([place])   . Palliative care by specialist   . Advance care planning   . Goals of care, counseling/discussion   . CHF (congestive heart failure) ([place])   . Altered mental status   . Septic shock ([place]) 08/02/2017  . Pneumonia 06/21/2017  . Thrombocytopenia ([place]) 06/21/2017  . Leukopenia 06/21/2017  . Pressure injury of skin 06/21/2017   PCP:  [name], MD Pharmacy:  No Pharmacies Listed    Social Determinants of Health (SDOH) Interventions    Readmission Risk Interventions No flowsheet data found.

## 2019-09-15 NOTE — NC FL2 (Signed)
[place] LEVEL OF CARE SCREENING TOOL     IDENTIFICATION  Patient Name: Jerry [name] Birthdate: November 06, 1956 Sex: male Admission Date (Current Location): 09/11/2019  [facility] and [place] Number:  Herbalist and Address:  [facility], [place] [address] [place], [place] [address]      Provider Number: [id number]  Attending Physician Name and Address:  Thurnell Lose, MD  Relative Name and Phone Number:       Current Level of Care: Hospital Recommended Level of Care: [place] Prior Approval Number:    Date Approved/Denied:   PASRR Number: [id number]  Discharge Plan: SNF    Current Diagnoses: Patient Active Problem List   Diagnosis Date Noted  . COVID-19 virus infection 09/11/2019  . Normocytic anemia 08/25/2019  . Community acquired pneumonia   . DNR (do not resuscitate) discussion   . SOB (shortness of breath) 07/16/2019  . Obesity hypoventilation syndrome ([place])   . Schizophrenia ([place])   . Palliative care by specialist   . Advance care planning   . Goals of care, counseling/discussion   . CHF (congestive heart failure) ([place])   . Altered mental status   . Septic shock ([place]) 08/02/2017  . Pneumonia 06/21/2017  . Thrombocytopenia ([place]) 06/21/2017  . Leukopenia 06/21/2017  . Pressure injury of skin 06/21/2017    Orientation RESPIRATION BLADDER Height & Weight        O2(2L) External catheter Weight: (!) 318 lb 9 oz (144.5 kg) Height:  6\' 2"  (188 cm)  BEHAVIORAL SYMPTOMS/MOOD NEUROLOGICAL BOWEL NUTRITION STATUS        Diet(See dc summary)  AMBULATORY STATUS COMMUNICATION OF NEEDS Skin   Extensive Assist Verbally                         Personal Care Assistance Level of Assistance  Bathing, Dressing, Feeding Bathing Assistance: Maximum assistance Feeding assistance: Limited assistance Dressing Assistance: Maximum assistance     Functional Limitations Info             SPECIAL CARE  FACTORS FREQUENCY  PT (By licensed PT), OT (By licensed OT)     PT Frequency: 5 OT Frequency: 5            Contractures      Additional Factors Info  Code Status, Allergies Code Status Info: DNR Allergies Info: Methadone Penicillins Propoxyphene Sulfa Antibiotics Valproic Acid And Related           Current Medications (09/15/2019):  This is the current hospital active medication list Current Facility-Administered Medications  Medication Dose Route Frequency Provider Last Rate Last Dose  . acetaminophen (TYLENOL) tablet 650 mg  650 mg Oral Q6H PRN Thurnell Lose, MD      . albuterol (VENTOLIN HFA) 108 (90 Base) MCG/ACT inhaler 2 puff  2 puff Inhalation Q4H PRN Thurnell Lose, MD   2 puff at 09/13/19 2136  . amLODipine (NORVASC) tablet 10 mg  10 mg Oral Daily Thurnell Lose, MD   10 mg at 09/15/19 0834  . benztropine (COGENTIN) tablet 0.5 mg  0.5 mg Oral BID Thurnell Lose, MD   0.5 mg at 09/15/19 0837  . docusate sodium (COLACE) capsule 100 mg  100 mg Oral BID Thurnell Lose, MD   100 mg at 09/15/19 0836  . enoxaparin (LOVENOX) injection 70 mg  70 mg Subcutaneous Q24H Thurnell Lose, MD   70 mg at 09/15/19 1148  .  feeding supplement (PRO-STAT SUGAR FREE 64) liquid 30 mL  30 mL Oral TID WC Thurnell Lose, MD   30 mL at 09/15/19 1149  . ferrous sulfate tablet 325 mg  325 mg Oral BID WC Thurnell Lose, MD   325 mg at 09/15/19 0837  . fluPHENAZine (PROLIXIN) tablet 15 mg  15 mg Oral Daily Thurnell Lose, MD   15 mg at 09/15/19 0833  . fluPHENAZine (PROLIXIN) tablet 15 mg  15 mg Oral QHS Thurnell Lose, MD   15 mg at 09/14/19 2244  . guaiFENesin-dextromethorphan (ROBITUSSIN DM) 100-10 MG/5ML syrup 10 mL  10 mL Oral Q4H PRN Thurnell Lose, MD   10 mL at 09/14/19 0848  . hydrALAZINE (APRESOLINE) injection 10 mg  10 mg Intravenous Q6H PRN Thurnell Lose, MD   10 mg at 09/14/19 1803  . hydrALAZINE (APRESOLINE) tablet 50 mg  50 mg Oral Q8H Thurnell Lose, MD   50 mg at 09/15/19 0504  . [START ON 09/19/2019] levothyroxine (SYNTHROID) tablet 200 mcg  200 mcg Oral Q0600 Thurnell Lose, MD      . LORazepam (ATIVAN) tablet 1 mg  1 mg Oral Q12H PRN Thurnell Lose, MD   1 mg at 09/13/19 1615  . methocarbamol (ROBAXIN) tablet 750 mg  750 mg Oral QHS Thurnell Lose, MD   750 mg at 09/15/19 0104  . methylPREDNISolone sodium succinate (SOLU-MEDROL) 40 mg/mL injection 20 mg  20 mg Intravenous Daily Thurnell Lose, MD   20 mg at 09/15/19 0835  . metoprolol tartrate (LOPRESSOR) tablet 100 mg  100 mg Oral BID Thurnell Lose, MD   100 mg at 09/15/19 0834  . multivitamin with minerals tablet 1 tablet  1 tablet Oral Daily Thurnell Lose, MD   1 tablet at 09/15/19 0834  . polyethylene glycol (MIRALAX / GLYCOLAX) packet 17 g  17 g Oral Daily Thurnell Lose, MD   17 g at 09/15/19 [id number]  . sodium chloride flush (NS) 0.9 % injection 3 mL  3 mL Intravenous Q12H Thurnell Lose, MD   3 mL at 09/15/19 0839  . tamsulosin (FLOMAX) capsule 0.4 mg  0.4 mg Oral Daily Thurnell Lose, MD   0.4 mg at 09/15/19 0837  . vitamin C (ASCORBIC ACID) tablet 500 mg  500 mg Oral BID Thurnell Lose, MD   500 mg at 09/15/19 [id number]     Discharge Medications: Please see discharge summary for a list of discharge medications.  Relevant Imaging Results:  Relevant Lab Results:   Additional Information SSN: [id number]  Weston Anna, LCSW

## 2019-09-16 LAB — CULTURE, BLOOD (ROUTINE X 2): Culture: NO GROWTH

## 2019-09-16 MED ORDER — METHYLPREDNISOLONE SODIUM SUCC 40 MG IJ SOLR: 10.0000 mg | Freq: Every day | INTRAMUSCULAR | Status: DC

## 2019-09-16 MED ORDER — METHYLPREDNISOLONE SODIUM SUCC 40 MG IJ SOLR
20.0000 mg | Freq: Every day | INTRAMUSCULAR | Status: DC
  Administered 2019-09-16: 20 mg via INTRAVENOUS
  Filled 2019-09-16: qty 1

## 2019-09-16 NOTE — Progress Notes (Signed)
  Speech Language Pathology Treatment: Dysphagia  Patient Details Name: Jerry [name] MRN: [id number] DOB: [birth year] Today's Date: 09/16/2019 Time: 0315-9458 SLP Time Calculation (min) (ACUTE ONLY): 10 min  Assessment / Plan / Recommendation Clinical Impression  Pt preparing for likely D/C this afternoon to facility.  He appears to be doing quite well with swallowing overall - while he needs assistance with self-feeding due to cognition, his oral control/anticipation, mastication, briskness of swallow, and airway protection appear to be WNL during today's session.  Breath sounds are diminished; respirations regular/unlabored during session.  Remains on nasal cannula at only one liter.  No concerns for aspiration at this time, although pt's risk may fluctuate given MS at any given time.  Recommend that pt be D/Cd on a mechanical soft diet; continue thin liquids.    HPI HPI: Pt is a 63 y.o. male admitted with COVID-19 PNA. Per chart, he has been experiencing recurrent PNAs with concern for aspiration. He had been evaluated by SLP initially recommending thin liquids, but most recent evaluations recommended Dys 1 diet and nectar thick liquids (thin liquids not tested) in the setting of declining mentation, oral holding. PMH includes: schizophrenia, severe extrapyramidal symptoms question underlying Parkinson's, chronic dysphagia with aspiration, advanced rheumatoid arthritis with multiple contractures in upper and lower extremities, extremely poor functional status largely bed and chair bound at SNF, BPH, chronic diastolic CHF last EF 59% on echocardiogram done in 2018, morbid obesity, history of peptic ulcer disease, BPH, hypothyroidism      SLP Plan  All goals met       Recommendations  Diet recommendations: Dysphagia 3 (mechanical soft);Thin liquid Liquids provided via: Cup;Straw Medication Administration: Whole meds with puree Supervision: Staff to assist with self feeding Compensations:  Minimize environmental distractions Postural Changes and/or Swallow Maneuvers: Seated upright 90 degrees                Oral Care Recommendations: Oral care BID Follow up Recommendations: Skilled Nursing facility SLP Visit Diagnosis: Dysphagia, unspecified (R13.10) Plan: All goals met       GO                Jerry [name] 09/16/2019, 11:09 AM  Jerry [name] Jerry [name], [place] Office number [phone]

## 2019-09-16 NOTE — Discharge Instructions (Signed)
Follow with Primary MD [name], MD in 7 days   Get CBC, CMP, 2 view Chest X ray -  checked next visit within 1 week by Primary MD or SNF MD    Activity: As tolerated with Full fall precautions use walker/cane & assistance as needed  Disposition SNF  Diet: SOFT - Heart Healthy with feeding assistance and aspiration precautions.  Special Instructions: If you have smoked or chewed Tobacco  in the last 2 yrs please stop smoking, stop any regular Alcohol  and or any Recreational drug use.  On your next visit with your primary care physician please Get Medicines reviewed and adjusted.  Please request your Prim.MD to go over all Hospital Tests and Procedure/Radiological results at the follow up, please get all Hospital records sent to your Prim MD by signing hospital release before you go home.  If you experience worsening of your admission symptoms, develop shortness of breath, life threatening emergency, suicidal or homicidal thoughts you must seek medical attention immediately by calling 911 or calling your MD immediately  if symptoms less severe.  You Must read complete instructions/literature along with all the possible adverse reactions/side effects for all the Medicines you take and that have been prescribed to you. Take any new Medicines after you have completely understood and accpet all the possible adverse reactions/side effects.       Person Under Monitoring Name: Jerry [name]  Location: [facility] [address] [place] [address]   Infection Prevention Recommendations for Individuals Confirmed to have, or Being Evaluated for, 2019 Novel Coronavirus (COVID-19) Infection Who Receive Care at Home  Individuals who are confirmed to have, or are being evaluated for, COVID-19 should follow the prevention steps below until a healthcare provider or local or state health department says they can return to normal activities.  Stay home except to get medical care You  should restrict activities outside your home, except for getting medical care. Do not go to work, school, or public areas, and do not use public transportation or taxis.  Call ahead before visiting your doctor Before your medical appointment, call the healthcare provider and tell them that you have, or are being evaluated for, COVID-19 infection. This will help the healthcare providers office take steps to keep other people from getting infected. Ask your healthcare provider to call the local or state health department.  Monitor your symptoms Seek prompt medical attention if your illness is worsening (e.g., difficulty breathing). Before going to your medical appointment, call the healthcare provider and tell them that you have, or are being evaluated for, COVID-19 infection. Ask your healthcare provider to call the local or state health department.  Wear a facemask You should wear a facemask that covers your nose and mouth when you are in the same room with other people and when you visit a healthcare provider. People who live with or visit you should also wear a facemask while they are in the same room with you.  Separate yourself from other people in your home As much as possible, you should stay in a different room from other people in your home. Also, you should use a separate bathroom, if available.  Avoid sharing household items You should not share dishes, drinking glasses, cups, eating utensils, towels, bedding, or other items with other people in your home. After using these items, you should wash them thoroughly with soap and water.  Cover your coughs and sneezes Cover your mouth and nose with a tissue  when you cough or sneeze, or you can cough or sneeze into your sleeve. Throw used tissues in a lined trash can, and immediately wash your hands with soap and water for at least 20 seconds or use an alcohol-based hand rub.  Wash your [facility] your hands often and thoroughly  with soap and water for at least 20 seconds. You can use an alcohol-based hand sanitizer if soap and water are not available and if your hands are not visibly dirty. Avoid touching your eyes, nose, and mouth with unwashed hands.   Prevention Steps for Caregivers and Household Members of Individuals Confirmed to have, or Being Evaluated for, COVID-19 Infection Being Cared for in the Home  If you live with, or provide care at home for, a person confirmed to have, or being evaluated for, COVID-19 infection please follow these guidelines to prevent infection:  Follow healthcare providers instructions Make sure that you understand and can help the patient follow any healthcare provider instructions for all care.  Provide for the patients basic needs You should help the patient with basic needs in the home and provide support for getting groceries, prescriptions, and other personal needs.  Monitor the patients symptoms If they are getting sicker, call his or her medical provider and tell them that the patient has, or is being evaluated for, COVID-19 infection. This will help the healthcare providers office take steps to keep other people from getting infected. Ask the healthcare provider to call the local or state health department.  Limit the number of people who have contact with the patient  If possible, have only one caregiver for the patient.  Other household members should stay in another home or place of residence. If this is not possible, they should stay  in another room, or be separated from the patient as much as possible. Use a separate bathroom, if available.  Restrict visitors who do not have an essential need to be in the home.  Keep older adults, very young children, and other sick people away from the patient Keep older adults, very young children, and those who have compromised immune systems or chronic health conditions away from the patient. This includes people  with chronic heart, lung, or kidney conditions, diabetes, and cancer.  Ensure good ventilation Make sure that shared spaces in the home have good air flow, such as from an air conditioner or an opened window, weather permitting.  Wash your hands often  Wash your hands often and thoroughly with soap and water for at least 20 seconds. You can use an alcohol based hand sanitizer if soap and water are not available and if your hands are not visibly dirty.  Avoid touching your eyes, nose, and mouth with unwashed hands.  Use disposable paper towels to dry your hands. If not available, use dedicated cloth towels and replace them when they become wet.  Wear a facemask and gloves  Wear a disposable facemask at all times in the room and gloves when you touch or have contact with the patients blood, body fluids, and/or secretions or excretions, such as sweat, saliva, sputum, nasal mucus, vomit, urine, or feces.  Ensure the mask fits over your nose and mouth tightly, and do not touch it during use.  Throw out disposable facemasks and gloves after using them. Do not reuse.  Wash your hands immediately after removing your facemask and gloves.  If your personal clothing becomes contaminated, carefully remove clothing and launder. Wash your hands after handling contaminated clothing.  Place all used disposable facemasks, gloves, and other waste in a lined container before disposing them with other household waste.  Remove gloves and wash your hands immediately after handling these items.  Do not share dishes, glasses, or other household items with the patient  Avoid sharing household items. You should not share dishes, drinking glasses, cups, eating utensils, towels, bedding, or other items with a patient who is confirmed to have, or being evaluated for, COVID-19 infection.  After the person uses these items, you should wash them thoroughly with soap and water.  Wash laundry  thoroughly  Immediately remove and wash clothes or bedding that have blood, body fluids, and/or secretions or excretions, such as sweat, saliva, sputum, nasal mucus, vomit, urine, or feces, on them.  Wear gloves when handling laundry from the patient.  Read and follow directions on labels of laundry or clothing items and detergent. In general, wash and dry with the warmest temperatures recommended on the label.  Clean all areas the individual has used often  Clean all touchable surfaces, such as counters, tabletops, doorknobs, bathroom fixtures, toilets, phones, keyboards, tablets, and bedside tables, every day. Also, clean any surfaces that may have blood, body fluids, and/or secretions or excretions on them.  Wear gloves when cleaning surfaces the patient has come in contact with.  Use a diluted bleach solution (e.g., dilute bleach with 1 part bleach and 10 parts water) or a household disinfectant with a label that says EPA-registered for coronaviruses. To make a bleach solution at home, add 1 tablespoon of bleach to 1 quart (4 cups) of water. For a larger supply, add  cup of bleach to 1 gallon (16 cups) of water.  Read labels of cleaning products and follow recommendations provided on product labels. Labels contain instructions for safe and effective use of the cleaning product including precautions you should take when applying the product, such as wearing gloves or eye protection and making sure you have good ventilation during use of the product.  Remove gloves and wash hands immediately after cleaning.  Monitor yourself for signs and symptoms of illness Caregivers and household members are considered close contacts, should monitor their health, and will be asked to limit movement outside of the home to the extent possible. Follow the monitoring steps for close contacts listed on the symptom monitoring form.   ? If you have additional questions, contact your local health department  or call the epidemiologist on call at [phone] (available 24/7). ? This guidance is subject to change. For the most up-to-date guidance from [facility], please refer to their website: [email or web address]

## 2019-09-16 NOTE — TOC Transition Note (Signed)
Transition of Care [facility]) - CM/SW Discharge Note   Patient Details  Name: Jerry [name] MRN: [id number] Date of Birth: [birth year]  Transition of Care [facility]) CM/SW Contact:  Ninfa Meeker, RN Phone Number: [phone] (working remotely) 09/16/2019, 2:47 PM   Clinical Narrative:   Case manager contacted Otila Kluver @ [facility] to confirm bed is available for patient to return. Discharge summary faxed to her via the [facility]. Case manager contacted patient's niece, [name] [phone] and updated her on patient's discharge plan. [facility] called, patient will be added to list, there are several ahead of him.  Bedside RN to call report to [phone], ask for [place], patient will go to room 304B.Case manager updated charge nurse.          Patient Goals and CMS Choice        Discharge Placement- returning to [facility].                        Discharge Plan and Services                                     Social Determinants of Health (SDOH) Interventions     Readmission Risk Interventions No flowsheet data found.

## 2019-09-16 NOTE — Progress Notes (Addendum)
[facility] here to transport patient to SNF. Assisted transfer from bed to stretcher via 2 RNs/2 EMTs.

## 2019-09-16 NOTE — Progress Notes (Signed)
Report called to Elmyra Ricks, RN @ [facility]. [phone].  Also called and updated niece, [name]  And all questions answered.

## 2019-09-16 NOTE — Discharge Summary (Addendum)
Jerry [name] [id number] DOB: [birth year] DOA: 09/11/2019  PCP: Jerry Pitch, MD  Admit date: 09/11/2019  Discharge date: 09/16/2019  Admitted From: SNF  Disposition:  SNF   Recommendations for Outpatient Follow-up:   Follow up with PCP in 1-2 weeks  PCP Please obtain BMP/CBC, 2 view CXR in 1week,  (see Discharge instructions)   PCP Please follow up on the following pending results:    Home Health: None   Equipment/Devices: None  Consultations: None  Discharge Condition: Stable    CODE STATUS: DNR   Diet Recommendation: SOFT Heart Healthy   CC - SOB   Brief history of present illness from the day of admission and additional interim summary    [name] a63 y.o.male,with history of schizophrenia, severe extrapyramidal symptoms question underlying Parkinson's, chronic dysphagia with aspiration, advanced rheumatoid arthritis with multiple contractures in upper and lower extremities, extremely poor functional status largely bed and chair bound at SNF, BPH, chronic diastolic CHF last EF 123456 on echocardiogram done in 2018, morbid obesity, history of peptic ulcer disease, BPH, hypothyroidism, chronic aspiration pneumonias with baseline DNR status.  With above history who has been struggling with recurrent aspiration pneumonias for several months, in the past he has decided not to get a feeding tube and to focus on being comfortable if he declines, lives in a nursing home and for the last few days was noted to be slightly short of breath, further work-up showed that he was COVID-19 positive he was then sent to [facility] ER from where he was transferred to [facility] for COVID-19 pneumonitis and further care. Patient besides mild cough and shortness of breath is symptom-free, denies any headache chest pain or  palpitations, no gastroenteritis no focal weakness.                                                                 Hospital Course   1.Acute COVID-19 pneumonitis. At this time will be admitted to the hospital and placed on IV steroids and Remdisvir, has been adequately hydrated with IV fluids, clinically at abseline, uses 2lit o2 at baseline, continue, tapered steroids to home dose, has finished his Remdesivir and now symptom free.  COVID-19 Labs  Recent Labs    09/14/19 0123  DDIMER 4.81*    Lab Results  Component Value Date   SARSCOV2NAA POSITIVE (A) 09/11/2019   SARSCOV2NAA NOT DETECTED 07/27/2019   SARSCOV2NAA NOT DETECTED 07/21/2019   SARSCOV2NAA REJ5 07/19/2019    2.Chronic dysphagia with recurrent aspiration pneumonia. For now soft diet, feeding assistance aspiration precautions, keep head of the bed elevated at 65 degrees at all times, speech eval and follow at SNF.  3.History of poor functional status, chair bound status, severe rheumatoid arthritis with severe underlying extraparametal symptoms question undiagnosed Parkinson's. Continue supportive care for now. Home medications will  be continued now to SNF.  4.Chronic diastolic CHF EF 123456 on last echocardiogram in 2018. Currently compensated.  5.Morbid obesity. BMI of 40. Follow with PCP.  6.Chronic leukopenia and thrombocytopenia. Monitor.  7.BPH. On Flomax.  8.Hypothyroidism.   TSH is suppressed, have lowered the dose upon discharge, PCP recheck TSH in 2-3 weeks.  9.Schizophrenia. Currently stable home medications continued.  10. AOCD - anemia panel inconclusive but more suggestive of Iron deficiency, place on PO Iron and monitor.   11.  Mild edema. Resolved after a dose of IV Lasix with potassium.  Monitor at SNF.    12.  Essential hypertension.  On beta-blocker dose doubled and added PO Norvasc for better control.    68. 1/2 contaminated Coag -ve Staph  BC.    Discharge diagnosis     Principal Problem:   COVID-19 virus infection Active Problems:   Schizophrenia ([place])   CHF (congestive heart failure) ([place])   Obesity hypoventilation syndrome ([place])   DNR (do not resuscitate) discussion    Discharge instructions    Discharge Instructions    Discharge instructions   Complete by: As directed    Follow with Primary MD Jerry Pitch, MD in 7 days   Get CBC, CMP, 2 view Chest X ray -  checked next visit within 1 week by Primary MD or SNF MD    Activity: As tolerated with Full fall precautions use walker/cane & assistance as needed  Disposition SNF  Diet: SOFT - Heart Healthy with feeding assistance and aspiration precautions.  Special Instructions: If you have smoked or chewed Tobacco  in the last 2 yrs please stop smoking, stop any regular Alcohol  and or any Recreational drug use.  On your next visit with your primary care physician please Get Medicines reviewed and adjusted.  Please request your Prim.MD to go over all Hospital Tests and Procedure/Radiological results at the follow up, please get all Hospital records sent to your Prim MD by signing hospital release before you go home.  If you experience worsening of your admission symptoms, develop shortness of breath, life threatening emergency, suicidal or homicidal thoughts you must seek medical attention immediately by calling 911 or calling your MD immediately  if symptoms less severe.  You Must read complete instructions/literature along with all the possible adverse reactions/side effects for all the Medicines you take and that have been prescribed to you. Take any new Medicines after you have completely understood and accpet all the possible adverse reactions/side effects.      Discharge Medications   Allergies as of 09/16/2019      Reactions   Methadone    Penicillins    Tolerates cephalosporins   Propoxyphene    Sulfa Antibiotics    Valproic Acid And  Related    Thrombocytopenia      Medication List    TAKE these medications   albuterol 108 (90 Base) MCG/ACT inhaler Commonly known as: VENTOLIN HFA Inhale into the lungs every 3 (three) hours as needed for wheezing or shortness of breath.   ammonium lactate 12 % lotion Commonly known as: LAC-HYDRIN Apply 1 application topically 2 (two) times daily. Apply topically to bilateral feet   benztropine 0.5 MG tablet Commonly known as: COGENTIN Take 0.5 mg by mouth 2 (two) times daily.   docusate sodium 100 MG capsule Commonly known as: COLACE Take 100 mg by mouth 2 (two) times daily.   feeding supplement (PRO-STAT SUGAR FREE 64) Liqd Take 30 mLs by mouth 3 (three) times  daily with meals. Sugar free   fluPHENAZine 5 MG tablet Commonly known as: PROLIXIN Take 3 tablets (15 mg total) by mouth at bedtime. What changed: how much to take   fluPHENAZine 5 MG tablet Commonly known as: PROLIXIN Take 3 tablets (15 mg total) by mouth daily. What changed: how much to take   guaifenesin 100 MG/5ML syrup Commonly known as: ROBITUSSIN Take 100 mg by mouth every 4 (four) hours as needed for cough.   hydrocortisone 10 MG tablet Commonly known as: CORTEF Take 1 tablet (10 mg total) by mouth 2 (two) times daily.   hydroxychloroquine 200 MG tablet Commonly known as: PLAQUENIL Take 200 mg by mouth 2 (two) times daily.   levothyroxine 200 MCG tablet Commonly known as: SYNTHROID Take 200 mcg by mouth daily before breakfast. What changed: Another medication with the same name was removed. Continue taking this medication, and follow the directions you see here.   LORazepam 2 MG tablet Commonly known as: ATIVAN Take 2 mg by mouth every 6 (six) hours as needed for anxiety.   methocarbamol 750 MG tablet Commonly known as: ROBAXIN Take 750 mg by mouth at bedtime.   metoprolol tartrate 50 MG tablet Commonly known as: LOPRESSOR Take 1 tablet (50 mg total) by mouth 2 (two) times daily.    MIRALAX PO Take 17 g by mouth daily.   multivitamin with minerals tablet Take 1 tablet by mouth daily.   OXYGEN Inhale 2 L into the lungs continuous.   tamsulosin 0.4 MG Caps capsule Commonly known as: FLOMAX Take 0.4 mg by mouth daily.   vitamin C 250 MG tablet Commonly known as: ASCORBIC ACID Take 500 mg by mouth 2 (two) times daily.       Follow-up Information    Jerry Pitch, MD. Schedule an appointment as soon as possible for a visit in 1 week(s).   Specialty: Family Medicine Contact information: [address] [place] [address] [phone]           Major procedures and Radiology Reports - PLEASE review detailed and final reports thoroughly  -         Dg Chest Port 1 View  Result Date: 09/11/2019 CLINICAL DATA:  63 year old male with shortness of breath. Has been treated for pneumonia in the past couple of months. EXAM: PORTABLE CHEST 1 VIEW COMPARISON:  Portable chest and chest CT 07/16/2019 and earlier. FINDINGS: Portable AP semi upright view at 0032 hours. New patchy and indistinct right upper and lower lung opacity when compared to August. Persistent hypo ventilation in the left lower lung appears not significantly changed from that time. Only the left upper lobe appears relatively clear today. Stable cardiac size and mediastinal contours. Visualized tracheal air column is within normal limits. No pneumothorax.  No pleural effusion suspected. IMPRESSION: Progressed multifocal right lung opacity compatible with pneumonia since the August comparisons. Continued abnormal left lung base opacity. No pleural effusion. Electronically Signed   By: Genevie Ann M.D.   On: 09/11/2019 01:01    Micro Results     Recent Results (from the past 240 hour(s))  SARS Coronavirus 2 by RT PCR (hospital order, performed in [facility] hospital lab) Nasopharyngeal Nasopharyngeal Swab     Status: Abnormal   Collection Time: 09/11/19 12:30 AM   Specimen: Nasopharyngeal Swab   Result Value Ref Range Status   SARS Coronavirus 2 POSITIVE (A) NEGATIVE Final    Comment: RESULT CALLED TO, READ BACK BY AND VERIFIED WITH: KATY FOUST RN AT  0217 ON 09/11/2019 SNG (NOTE) If result is NEGATIVE SARS-CoV-2 target nucleic acids are NOT DETECTED. The SARS-CoV-2 RNA is generally detectable in upper and lower  respiratory specimens during the acute phase of infection. The lowest  concentration of SARS-CoV-2 viral copies this assay can detect is 250  copies / mL. A negative result does not preclude SARS-CoV-2 infection  and should not be used as the sole basis for treatment or other  patient management decisions.  A negative result may occur with  improper specimen collection / handling, submission of specimen other  than nasopharyngeal swab, presence of viral mutation(s) within the  areas targeted by this assay, and inadequate number of viral copies  (<250 copies / mL). A negative result must be combined with clinical  observations, patient history, and epidemiological information. If result is POSITIVE SARS-CoV-2 target nucleic acids are DETECTED . The SARS-CoV-2 RNA is generally detectable in upper and lower  respiratory specimens during the acute phase of infection.  Positive  results are indicative of active infection with SARS-CoV-2.  Clinical  correlation with patient history and other diagnostic information is  necessary to determine patient infection status.  Positive results do  not rule out bacterial infection or co-infection with other viruses. If result is PRESUMPTIVE POSTIVE SARS-CoV-2 nucleic acids MAY BE PRESENT.   A presumptive positive result was obtained on the submitted specimen  and confirmed on repeat testing.  While 2019 novel coronavirus  (SARS-CoV-2) nucleic acids may be present in the submitted sample  additional confirmatory testing may be necessary for epidemiological  and / or clinical management purposes  to differentiate between  SARS-CoV-2  and other Sarbecovirus currently known to infect humans.  If clinically indicated additional testing with an alternate test  methodology [phone] ) is advised. The SARS-CoV-2 RNA is generally  detectable in upper and lower respiratory specimens during the acute  phase of infection. The expected result is Negative. Fact Sheet for Patients:  StrictlyIdeas.no Fact Sheet for Healthcare Providers: BankingDealers.co.za This test is not yet approved or cleared by the [place] FDA and has been authorized for detection and/or diagnosis of SARS-CoV-2 by FDA under an Emergency Use Authorization (EUA).  This EUA will remain in effect (meaning this test can be used) for the duration of the COVID-19 declaration under Section 564(b)(1) of the Act, 21 U.S.C. section 360bbb-3(b)(1), unless the authorization is terminated or revoked sooner. Performed at [facility], [address] [place], [place] [address]   Blood Culture (routine x 2)     Status: None   Collection Time: 09/11/19 12:30 AM   Specimen: BLOOD  Result Value Ref Range Status   Specimen Description BLOOD LEFT ASSIST CONTROL  Final   Special Requests   Final    BOTTLES DRAWN AEROBIC AND ANAEROBIC Blood Culture results may not be optimal due to an excessive volume of blood received in culture bottles   Culture   Final    NO GROWTH 5 DAYS Performed at [facility], [address] [place], [place] [address]    Report Status 09/16/2019 FINAL  Final  Blood Culture (routine x 2)     Status: Abnormal   Collection Time: 09/11/19 12:44 AM   Specimen: BLOOD  Result Value Ref Range Status   Specimen Description   Final    BLOOD RIGHT FATTY CASTS Performed at [facility], [address] [place], [place] [address]    Special Requests   Final    BOTTLES DRAWN AEROBIC AND  ANAEROBIC Blood Culture results may not be optimal due to an excessive volume of blood  received in culture bottles Performed at [facility], [address] [place], [place] [address]    Culture  Setup Time   Final    GRAM POSITIVE COCCI AEROBIC BOTTLE ONLY CRITICAL RESULT CALLED TO, READ BACK BY AND VERIFIED WITH: [facility] MANCHERIL AT [id number] 09/12/2019 SDR Performed at [facility], [address] [place], [place] [address]    Culture (A)  Final    STAPHYLOCOCCUS SPECIES (COAGULASE NEGATIVE) THE SIGNIFICANCE OF ISOLATING THIS ORGANISM FROM A SINGLE SET OF BLOOD CULTURES WHEN MULTIPLE SETS ARE DRAWN IS UNCERTAIN. PLEASE NOTIFY THE MICROBIOLOGY DEPARTMENT WITHIN ONE WEEK IF SPECIATION AND SENSITIVITIES ARE REQUIRED. Performed at [facility], [place] [address] [place], [place] [address]    Report Status 09/15/2019 FINAL  Final    Today   Subjective    Darlene James today has no headache,no chest abdominal pain,no new weakness tingling or numbness, feels much better     Objective   Blood pressure 115/82, pulse 71, temperature 98.1 F (36.7 C), temperature source Oral, resp. rate 16, height 6\' 2"  (1.88 m), weight (!) 144.5 kg, SpO2 93 %.   Intake/Output Summary (Last 24 hours) at 09/16/2019 0911 Last data filed at 09/16/2019 0500 Gross per 24 hour  Intake 357 ml  Output 975 ml  Net -618 ml    Exam Awake Alert,  No new F.N deficits,  Multiple chronic contractures [place].AT,PERRAL Supple Neck,No JVD, No cervical lymphadenopathy appriciated.  Symmetrical Chest wall movement, Good air movement bilaterally, CTAB RRR,No Gallops,Rubs or new Murmurs, No Parasternal Heave +ve B.Sounds, Abd Soft, Non tender, No organomegaly appriciated, No rebound -guarding or rigidity. No Cyanosis, Clubbing or edema, No new Rash or bruise   Data Review   CBC w Diff:  Lab Results  Component Value Date   WBC 5.1 09/14/2019   HGB 7.6 (L) 09/14/2019   HGB 11.2 (L) 01/19/2013   HCT 25.7 (L) 09/14/2019   HCT 34.0 (L) 01/19/2013   PLT 225 09/14/2019   PLT 334  01/19/2013   LYMPHOPCT 25 09/14/2019   LYMPHOPCT 18.9 01/19/2013   MONOPCT 14 09/14/2019   MONOPCT 8.0 01/19/2013   EOSPCT 0 09/14/2019   EOSPCT 3.2 01/19/2013   BASOPCT 0 09/14/2019   BASOPCT 0.8 01/19/2013    CMP:  Lab Results  Component Value Date   NA 140 09/14/2019   NA 140 01/05/2013   K 4.0 09/14/2019   K 3.8 01/05/2013   CL 105 09/14/2019   CL 105 01/05/2013   CO2 26 09/14/2019   CO2 28 01/05/2013   BUN 23 09/14/2019   BUN 15 01/05/2013   CREATININE 0.49 (L) 09/14/2019   CREATININE 0.75 01/05/2013   PROT 6.6 09/14/2019   PROT 8.1 01/05/2013   ALBUMIN 2.6 (L) 09/14/2019   ALBUMIN 2.9 (L) 01/05/2013   BILITOT 0.6 09/14/2019   BILITOT 0.6 01/05/2013   ALKPHOS 77 09/14/2019   ALKPHOS 79 01/05/2013   AST 22 09/14/2019   AST 20 01/05/2013   ALT 17 09/14/2019   ALT 9 (L) 01/05/2013  .   Total Time in preparing paper work, data evaluation and todays exam - 76 minutes  Lala Lund M.D on 09/16/2019 at [facility]   Office  [phone]

## 2019-09-22 ENCOUNTER — Other Ambulatory Visit: Payer: Self-pay

## 2019-09-22 ENCOUNTER — Inpatient Hospital Stay: Payer: Medicaid Other

## 2019-09-22 ENCOUNTER — Inpatient Hospital Stay: Payer: Medicaid Other | Admitting: Nurse Practitioner

## 2019-10-14 ENCOUNTER — Inpatient Hospital Stay: Payer: Medicaid Other

## 2019-10-14 ENCOUNTER — Inpatient Hospital Stay: Payer: Medicaid Other | Admitting: Oncology

## 2019-10-15 ENCOUNTER — Other Ambulatory Visit: Payer: Self-pay

## 2019-10-15 ENCOUNTER — Non-Acute Institutional Stay: Payer: Medicaid Other | Admitting: Primary Care

## 2019-10-15 DIAGNOSIS — Z515 Encounter for palliative care: Secondary | ICD-10-CM

## 2019-10-15 NOTE — Progress Notes (Signed)
[facility] Palliative Care Consult Note Telephone: [phone]  Fax: [phone]  TELEHEALTH VISIT STATEMENT Due to the COVID-19 crisis, this visit was done via telemedicine from my office. It was initiated and consented to by this patient and/or family.  PATIENT NAME: Aveer [employer] [address] [place] [address] [phone] (home)  DOB: [birth year] MRN: [id number]  PRIMARY CARE PROVIDER:   [name], MD, [address] [place] [address] [phone]  REFERRING PROVIDER:  [name], MD [address] [place],  [place] [address] [phone]  RESPONSIBLE PARTY:   Extended Emergency Contact Information Primary Emergency Contact: Manual Meier of [place] Phone: [phone] Relation: Niece Secondary Emergency Contact: Tomasa Rand [place] of [place] Phone: [phone] Relation: Sister   ASSESSMENT AND RECOMMENDATIONS:   1. Advance Care Planning/Goals of Care: Goals include to maximize quality of life and symptom management.  2. Symptom Management:   Dyspnea: Had covid and was hospitalized, back to facility with oxygen. More ill appearing than before covid hospitalization, but able to talk quite  Bit during interview without excessive DOE.  Pain: Voices occasional pain in back and upper arms, asked staff to use acetaminophen for pain.  Mentation: Confused, speaking about cooking with Vassie Loll and wanting breakfast.   3. Family /Caregiver/Community Supports:  [name], niece, prefers Solic[employer]. Currently a resident in [place].   4. Cognitive / Functional decline: At cognitive baseline of confusion due to established mental illness. Currently debilitated from recent illness, mostly in bed and occ. oob in room. Able to do a few adls and no IADLS. This is a decline from several months ago when he could dial phone, look at books.  5. Follow up Palliative Care Visit: Palliative care will  continue to follow for goals of care clarification and symptom management. Return 4-6 weeks or prn.  I spent 25 minutes providing this consultation,  from 1130 to 1155. More than 50% of the time in this consultation was spent coordinating communication.   HISTORY OF PRESENT ILLNESS:  Rodger Malick is a 63 y.o. year old male with multiple medical problems including COPD, CHF, obesity, Schizophrenia.. Palliative Care was asked to follow this patient by consultation request of [name], MD to help address advance care planning and goals of care. This is a follow up visit.  CODE STATUS:  FULL CODE  PPS: 30% HOSPICE ELIGIBILITY/DIAGNOSIS: TBD  PAST MEDICAL HISTORY:  Past Medical History:  Diagnosis Date  . BPH (benign prostatic hyperplasia)   . CHF (congestive heart failure) ([place])   . Chronic pain   . COPD (chronic obstructive pulmonary disease) ([place])   . Hypertension   . Morbid obesity ([place])   . Pressure ulcer   . Rheumatoid arthritis ([place])   . Schizophrenia ([place])   . Thyroid disease     SOCIAL HX:  Social History   Tobacco Use  . Smoking status: Never Smoker  . Smokeless tobacco: Never Used  Substance Use Topics  . Alcohol use: No    ALLERGIES:  Allergies  Allergen Reactions  . Methadone   . Penicillins     Tolerates cephalosporins   . Propoxyphene   . Sulfa Antibiotics   . Valproic Acid And Related     Thrombocytopenia     PERTINENT MEDICATIONS:  Outpatient Encounter Medications as of 10/15/2019  Medication Sig  . albuterol (PROVENTIL HFA;VENTOLIN HFA) 108 (90 Base) MCG/ACT inhaler Inhale into the lungs every 3 (three) hours as  needed for wheezing or shortness of breath.   . Amino Acids-Protein Hydrolys (FEEDING SUPPLEMENT, PRO-STAT SUGAR FREE 64,) LIQD Take 30 mLs by mouth 3 (three) times daily with meals. Sugar free  . ammonium lactate (LAC-HYDRIN) 12 % lotion Apply 1 application topically 2 (two) times daily. Apply topically to bilateral feet  . benztropine  (COGENTIN) 0.5 MG tablet Take 0.5 mg by mouth 2 (two) times daily.  Marland Kitchen docusate sodium (COLACE) 100 MG capsule Take 100 mg by mouth 2 (two) times daily.  . fluPHENAZine (PROLIXIN) 5 MG tablet Take 3 tablets (15 mg total) by mouth daily. (Patient taking differently: Take 17.5 mg by mouth daily. )  . fluPHENAZine (PROLIXIN) 5 MG tablet Take 3 tablets (15 mg total) by mouth at bedtime. (Patient taking differently: Take 17.5 mg by mouth at bedtime. )  . guaifenesin (ROBITUSSIN) 100 MG/5ML syrup Take 100 mg by mouth every 4 (four) hours as needed for cough.  . hydrocortisone (CORTEF) 10 MG tablet Take 1 tablet (10 mg total) by mouth 2 (two) times daily.  . hydroxychloroquine (PLAQUENIL) 200 MG tablet Take 200 mg by mouth 2 (two) times daily.  Marland Kitchen levothyroxine (SYNTHROID) 200 MCG tablet Take 200 mcg by mouth daily before breakfast.   . LORazepam (ATIVAN) 2 MG tablet Take 2 mg by mouth every 6 (six) hours as needed for anxiety.  . methocarbamol (ROBAXIN) 750 MG tablet Take 750 mg by mouth at bedtime.  . metoprolol tartrate (LOPRESSOR) 50 MG tablet Take 1 tablet (50 mg total) by mouth 2 (two) times daily.  . Multiple Vitamins-Minerals (MULTIVITAMIN WITH MINERALS) tablet Take 1 tablet by mouth daily.  . OXYGEN Inhale 2 L into the lungs continuous.  . Polyethylene Glycol 3350 (MIRALAX PO) Take 17 g by mouth daily.  . tamsulosin (FLOMAX) 0.4 MG CAPS capsule Take 0.4 mg by mouth daily.  . vitamin C (ASCORBIC ACID) 250 MG tablet Take 500 mg by mouth 2 (two) times daily.   No facility-administered encounter medications on file as of 10/15/2019.     PHYSICAL EXAM / ROS:    Current and past weights: daily for edema, 318 lbs General: NAD, frail appearing,obese. States some pain Cardiovascular: no chest pain reported, bil UE forearms edema,  Pulmonary: no cough, no increased SOB, oxygen 3 l/ [place] post covid  Abdomen: appetite good, denies constipation, incontinent of bowel GU: denies dysuria, incontinent of  urine MSK:  no joint deformities, non ambulatory, in chair and bed now, no falls, H/o RA. Skin: no rashes or wounds reported Neurological: Weakness, mental confusion  Jason Coop, NP

## 2019-10-17 ENCOUNTER — Non-Acute Institutional Stay: Payer: Medicaid Other | Admitting: Primary Care

## 2019-10-29 ENCOUNTER — Other Ambulatory Visit: Payer: Self-pay

## 2019-10-29 NOTE — Progress Notes (Signed)
Patient pre screened for office appointment, no questions or concerns today. Patient reminded of upcoming appointment time and date. Patient at [facility], care nurse provided chart information.

## 2019-10-30 ENCOUNTER — Encounter: Payer: Self-pay | Admitting: Emergency Medicine

## 2019-10-30 ENCOUNTER — Inpatient Hospital Stay: Payer: Medicaid Other | Attending: Internal Medicine

## 2019-10-30 ENCOUNTER — Other Ambulatory Visit: Payer: Self-pay

## 2019-10-30 ENCOUNTER — Inpatient Hospital Stay (HOSPITAL_BASED_OUTPATIENT_CLINIC_OR_DEPARTMENT_OTHER): Payer: Medicaid Other | Admitting: Internal Medicine

## 2019-10-30 ENCOUNTER — Inpatient Hospital Stay
Admission: EM | Admit: 2019-10-30 | Discharge: 2019-11-20 | DRG: 374 | Disposition: A | Discharge status: Skilled Nursing Facility | Payer: Medicaid Other | Source: Skilled Nursing Facility | Attending: Internal Medicine | Admitting: Internal Medicine

## 2019-10-30 DIAGNOSIS — D649 Anemia, unspecified: Secondary | ICD-10-CM

## 2019-10-30 DIAGNOSIS — Z6839 Body mass index (BMI) 39.0-39.9, adult: Secondary | ICD-10-CM

## 2019-10-30 DIAGNOSIS — Z79899 Other long term (current) drug therapy: Secondary | ICD-10-CM

## 2019-10-30 DIAGNOSIS — C182 Malignant neoplasm of ascending colon: Secondary | ICD-10-CM | POA: Diagnosis present

## 2019-10-30 DIAGNOSIS — J9611 Chronic respiratory failure with hypoxia: Secondary | ICD-10-CM

## 2019-10-30 DIAGNOSIS — Z8619 Personal history of other infectious and parasitic diseases: Secondary | ICD-10-CM

## 2019-10-30 DIAGNOSIS — C189 Malignant neoplasm of colon, unspecified: Secondary | ICD-10-CM | POA: Diagnosis present

## 2019-10-30 DIAGNOSIS — D696 Thrombocytopenia, unspecified: Secondary | ICD-10-CM | POA: Diagnosis present

## 2019-10-30 DIAGNOSIS — Z66 Do not resuscitate: Secondary | ICD-10-CM | POA: Diagnosis present

## 2019-10-30 DIAGNOSIS — K6389 Other specified diseases of intestine: Secondary | ICD-10-CM | POA: Diagnosis not present

## 2019-10-30 DIAGNOSIS — D500 Iron deficiency anemia secondary to blood loss (chronic): Secondary | ICD-10-CM | POA: Diagnosis present

## 2019-10-30 DIAGNOSIS — J690 Pneumonitis due to inhalation of food and vomit: Secondary | ICD-10-CM | POA: Diagnosis not present

## 2019-10-30 DIAGNOSIS — I5032 Chronic diastolic (congestive) heart failure: Secondary | ICD-10-CM | POA: Diagnosis present

## 2019-10-30 DIAGNOSIS — Z880 Allergy status to penicillin: Secondary | ICD-10-CM

## 2019-10-30 DIAGNOSIS — F259 Schizoaffective disorder, unspecified: Secondary | ICD-10-CM | POA: Diagnosis present

## 2019-10-30 DIAGNOSIS — Z9981 Dependence on supplemental oxygen: Secondary | ICD-10-CM | POA: Diagnosis not present

## 2019-10-30 DIAGNOSIS — E079 Disorder of thyroid, unspecified: Secondary | ICD-10-CM | POA: Diagnosis present

## 2019-10-30 DIAGNOSIS — F203 Undifferentiated schizophrenia: Secondary | ICD-10-CM | POA: Diagnosis not present

## 2019-10-30 DIAGNOSIS — I110 Hypertensive heart disease with heart failure: Secondary | ICD-10-CM | POA: Diagnosis present

## 2019-10-30 DIAGNOSIS — E271 Primary adrenocortical insufficiency: Secondary | ICD-10-CM

## 2019-10-30 DIAGNOSIS — Z515 Encounter for palliative care: Secondary | ICD-10-CM

## 2019-10-30 DIAGNOSIS — K922 Gastrointestinal hemorrhage, unspecified: Secondary | ICD-10-CM | POA: Diagnosis not present

## 2019-10-30 DIAGNOSIS — F29 Unspecified psychosis not due to a substance or known physiological condition: Secondary | ICD-10-CM | POA: Diagnosis present

## 2019-10-30 DIAGNOSIS — J189 Pneumonia, unspecified organism: Secondary | ICD-10-CM

## 2019-10-30 DIAGNOSIS — G9340 Encephalopathy, unspecified: Secondary | ICD-10-CM | POA: Diagnosis present

## 2019-10-30 DIAGNOSIS — Z993 Dependence on wheelchair: Secondary | ICD-10-CM

## 2019-10-30 DIAGNOSIS — J449 Chronic obstructive pulmonary disease, unspecified: Secondary | ICD-10-CM | POA: Diagnosis not present

## 2019-10-30 DIAGNOSIS — D630 Anemia in neoplastic disease: Secondary | ICD-10-CM | POA: Diagnosis present

## 2019-10-30 DIAGNOSIS — Z7401 Bed confinement status: Secondary | ICD-10-CM | POA: Diagnosis not present

## 2019-10-30 DIAGNOSIS — Z885 Allergy status to narcotic agent status: Secondary | ICD-10-CM

## 2019-10-30 DIAGNOSIS — J9621 Acute and chronic respiratory failure with hypoxia: Secondary | ICD-10-CM | POA: Diagnosis present

## 2019-10-30 DIAGNOSIS — D509 Iron deficiency anemia, unspecified: Secondary | ICD-10-CM

## 2019-10-30 DIAGNOSIS — R7989 Other specified abnormal findings of blood chemistry: Secondary | ICD-10-CM | POA: Diagnosis present

## 2019-10-30 DIAGNOSIS — Z79891 Long term (current) use of opiate analgesic: Secondary | ICD-10-CM

## 2019-10-30 DIAGNOSIS — M069 Rheumatoid arthritis, unspecified: Secondary | ICD-10-CM | POA: Diagnosis present

## 2019-10-30 DIAGNOSIS — R0602 Shortness of breath: Secondary | ICD-10-CM

## 2019-10-30 DIAGNOSIS — Z6841 Body Mass Index (BMI) 40.0 and over, adult: Secondary | ICD-10-CM | POA: Diagnosis not present

## 2019-10-30 DIAGNOSIS — N400 Enlarged prostate without lower urinary tract symptoms: Secondary | ICD-10-CM | POA: Diagnosis present

## 2019-10-30 DIAGNOSIS — Z882 Allergy status to sulfonamides status: Secondary | ICD-10-CM

## 2019-10-30 DIAGNOSIS — Z9911 Dependence on respirator [ventilator] status: Secondary | ICD-10-CM

## 2019-10-30 DIAGNOSIS — K5900 Constipation, unspecified: Secondary | ICD-10-CM

## 2019-10-30 DIAGNOSIS — E662 Morbid (severe) obesity with alveolar hypoventilation: Secondary | ICD-10-CM | POA: Diagnosis present

## 2019-10-30 DIAGNOSIS — I10 Essential (primary) hypertension: Secondary | ICD-10-CM | POA: Diagnosis not present

## 2019-10-30 DIAGNOSIS — Z888 Allergy status to other drugs, medicaments and biological substances status: Secondary | ICD-10-CM

## 2019-10-30 DIAGNOSIS — G8929 Other chronic pain: Secondary | ICD-10-CM | POA: Diagnosis not present

## 2019-10-30 DIAGNOSIS — I5089 Other heart failure: Secondary | ICD-10-CM | POA: Diagnosis not present

## 2019-10-30 DIAGNOSIS — Z79890 Hormone replacement therapy (postmenopausal): Secondary | ICD-10-CM

## 2019-10-30 LAB — CBC
HCT: 23.7 % — ABNORMAL LOW (ref 39.0–52.0)
HCT: 26.5 % — ABNORMAL LOW (ref 39.0–52.0)
Hemoglobin: 6.8 g/dL — ABNORMAL LOW (ref 13.0–17.0)
Hemoglobin: 8 g/dL — ABNORMAL LOW (ref 13.0–17.0)
MCH: 22.7 pg — ABNORMAL LOW (ref 26.0–34.0)
MCH: 23.7 pg — ABNORMAL LOW (ref 26.0–34.0)
MCHC: 28.7 g/dL — ABNORMAL LOW (ref 30.0–36.0)
MCHC: 30.2 g/dL (ref 30.0–36.0)
MCV: 79 fL — ABNORMAL LOW (ref 80.0–100.0)
MCV: 78.6 fL — ABNORMAL LOW (ref 80.0–100.0)
Platelets: 275 10*3/uL (ref 150–400)
Platelets: 251 10*3/uL (ref 150–400)
RBC: 3 MIL/uL — ABNORMAL LOW (ref 4.22–5.81)
RBC: 3.37 MIL/uL — ABNORMAL LOW (ref 4.22–5.81)
RDW: 17.4 % — ABNORMAL HIGH (ref 11.5–15.5)
RDW: 16.7 % — ABNORMAL HIGH (ref 11.5–15.5)
WBC: 5.9 10*3/uL (ref 4.0–10.5)
WBC: 5 10*3/uL (ref 4.0–10.5)
nRBC: 0 % (ref 0.0–0.2)
nRBC: 0 % (ref 0.0–0.2)

## 2019-10-30 LAB — BASIC METABOLIC PANEL
Anion gap: 7 (ref 5–15)
BUN: 12 mg/dL (ref 8–23)
CO2: 29 mmol/L (ref 22–32)
Calcium: 8.6 mg/dL — ABNORMAL LOW (ref 8.9–10.3)
Chloride: 103 mmol/L (ref 98–111)
Creatinine, Ser: 0.49 mg/dL — ABNORMAL LOW (ref 0.61–1.24)
GFR calc Af Amer: >60 mL/min (ref >60)
GFR calc non Af Amer: >60 mL/min (ref >60)
Glucose, Bld: 96 mg/dL (ref 70–99)
Potassium: 3.8 mmol/L (ref 3.5–5.1)
Sodium: 139 mmol/L (ref 135–145)

## 2019-10-30 LAB — PREPARE RBC (CROSSMATCH)

## 2019-10-30 MED ORDER — PANTOPRAZOLE SODIUM 40 MG IV SOLR
40.0000 mg | Freq: Two times a day (BID) | INTRAVENOUS | Status: DC
  Administered 2019-10-30 – 2019-10-31 (×3): 40 mg via INTRAVENOUS
  Filled 2019-10-30 (×3): qty 40

## 2019-10-30 MED ORDER — ONDANSETRON HCL 4 MG PO TABS: 4.0000 mg | ORAL_TABLET | Freq: Four times a day (QID) | ORAL | Status: DC | PRN

## 2019-10-30 MED ORDER — DOCUSATE SODIUM 100 MG PO CAPS
100.0000 mg | ORAL_CAPSULE | Freq: Two times a day (BID) | ORAL | Status: DC
  Administered 2019-10-30 – 2019-11-18 (×26): 100 mg via ORAL
  Filled 2019-10-30 (×36): qty 1

## 2019-10-30 MED ORDER — TRAMADOL HCL 50 MG PO TABS
50.0000 mg | ORAL_TABLET | Freq: Four times a day (QID) | ORAL | Status: DC | PRN
  Administered 2019-10-31 – 2019-11-19 (×10): 50 mg via ORAL
  Filled 2019-10-30 (×11): qty 1

## 2019-10-30 MED ORDER — SODIUM CHLORIDE 0.9% IV SOLUTION
Freq: Once | INTRAVENOUS | Status: DC
  Filled 2019-10-30: qty 250

## 2019-10-30 MED ORDER — VITAMIN C 500 MG PO TABS
500.0000 mg | ORAL_TABLET | Freq: Two times a day (BID) | ORAL | Status: DC
  Administered 2019-10-30 – 2019-11-19 (×37): 500 mg via ORAL
  Filled 2019-10-30 (×41): qty 1

## 2019-10-30 MED ORDER — ADULT MULTIVITAMIN W/MINERALS CH
1.0000 | ORAL_TABLET | Freq: Every day | ORAL | Status: DC
  Administered 2019-11-01 – 2019-11-18 (×16): 1 via ORAL
  Filled 2019-10-30 (×17): qty 1

## 2019-10-30 MED ORDER — TAMSULOSIN HCL 0.4 MG PO CAPS
0.4000 mg | ORAL_CAPSULE | Freq: Every day | ORAL | Status: DC
  Administered 2019-10-30 – 2019-11-20 (×20): 0.4 mg via ORAL
  Filled 2019-10-30 (×19): qty 1

## 2019-10-30 MED ORDER — BENZTROPINE MESYLATE 0.5 MG PO TABS
0.5000 mg | ORAL_TABLET | Freq: Two times a day (BID) | ORAL | Status: DC
  Administered 2019-10-30 – 2019-11-20 (×38): 0.5 mg via ORAL
  Filled 2019-10-30 (×42): qty 1

## 2019-10-30 MED ORDER — ACETAMINOPHEN 650 MG RE SUPP: 650.0000 mg | Freq: Four times a day (QID) | RECTAL | Status: DC | PRN

## 2019-10-30 MED ORDER — HYDROCORTISONE 10 MG PO TABS
10.0000 mg | ORAL_TABLET | Freq: Two times a day (BID) | ORAL | Status: DC
  Administered 2019-10-30 – 2019-11-20 (×39): 10 mg via ORAL
  Filled 2019-10-30 (×45): qty 1

## 2019-10-30 MED ORDER — ONDANSETRON HCL 4 MG/2ML IJ SOLN: 4.0000 mg | Freq: Four times a day (QID) | INTRAMUSCULAR | Status: DC | PRN

## 2019-10-30 MED ORDER — LORAZEPAM 2 MG PO TABS
2.0000 mg | ORAL_TABLET | Freq: Four times a day (QID) | ORAL | Status: DC | PRN
  Administered 2019-11-09 – 2019-11-11 (×2): 2 mg via ORAL
  Filled 2019-10-30 (×2): qty 1

## 2019-10-30 MED ORDER — FLUPHENAZINE HCL 5 MG PO TABS
15.0000 mg | ORAL_TABLET | Freq: Every day | ORAL | Status: DC
  Administered 2019-10-30 – 2019-11-19 (×20): 15 mg via ORAL
  Filled 2019-10-30 (×25): qty 3

## 2019-10-30 MED ORDER — FLUPHENAZINE HCL 5 MG PO TABS
15.0000 mg | ORAL_TABLET | Freq: Every day | ORAL | Status: DC
  Administered 2019-11-01 – 2019-11-20 (×19): 15 mg via ORAL
  Filled 2019-10-30 (×21): qty 3

## 2019-10-30 MED ORDER — LEVOTHYROXINE SODIUM 100 MCG PO TABS
200.0000 ug | ORAL_TABLET | Freq: Every day | ORAL | Status: DC
  Administered 2019-11-03 – 2019-11-06 (×3): 200 ug via ORAL
  Filled 2019-10-30 (×3): qty 2

## 2019-10-30 MED ORDER — HYDROXYCHLOROQUINE SULFATE 200 MG PO TABS
200.0000 mg | ORAL_TABLET | Freq: Two times a day (BID) | ORAL | Status: DC
  Administered 2019-10-31 – 2019-11-20 (×39): 200 mg via ORAL
  Filled 2019-10-30 (×42): qty 1

## 2019-10-30 MED ORDER — POLYETHYLENE GLYCOL 3350 17 G PO PACK: 17.0000 g | PACK | Freq: Every day | ORAL | Status: DC | PRN

## 2019-10-30 MED ORDER — POLYETHYLENE GLYCOL 3350 17 G PO PACK
17.0000 g | PACK | Freq: Every day | ORAL | Status: DC
  Administered 2019-11-03 – 2019-11-19 (×8): 17 g via ORAL
  Filled 2019-10-30 (×13): qty 1

## 2019-10-30 MED ORDER — ACETAMINOPHEN 325 MG PO TABS
650.0000 mg | ORAL_TABLET | Freq: Four times a day (QID) | ORAL | Status: DC | PRN
  Administered 2019-11-03 – 2019-11-19 (×12): 650 mg via ORAL
  Filled 2019-10-30 (×12): qty 2

## 2019-10-30 MED ORDER — METOPROLOL TARTRATE 50 MG PO TABS
50.0000 mg | ORAL_TABLET | Freq: Two times a day (BID) | ORAL | Status: DC
  Administered 2019-10-30 – 2019-11-20 (×38): 50 mg via ORAL
  Filled 2019-10-30 (×38): qty 1

## 2019-10-30 MED ORDER — SODIUM CHLORIDE 0.9 % IV SOLN
Freq: Once | INTRAVENOUS | Status: AC
  Administered 2019-10-31: 05:00:00 via INTRAVENOUS

## 2019-10-30 MED ORDER — GUAIFENESIN 100 MG/5ML PO SOLN
100.0000 mg | ORAL | Status: DC | PRN
  Filled 2019-10-30: qty 5

## 2019-10-30 MED ORDER — SENNA 8.6 MG PO TABS
1.0000 | ORAL_TABLET | Freq: Two times a day (BID) | ORAL | Status: DC
  Administered 2019-10-30 – 2019-11-18 (×22): 8.6 mg via ORAL
  Filled 2019-10-30 (×33): qty 1

## 2019-10-30 MED ORDER — ALBUTEROL SULFATE (2.5 MG/3ML) 0.083% IN NEBU
2.5000 mg | INHALATION_SOLUTION | RESPIRATORY_TRACT | Status: DC | PRN
  Administered 2019-11-08 – 2019-11-15 (×5): 2.5 mg via RESPIRATORY_TRACT
  Filled 2019-10-30 (×5): qty 3

## 2019-10-30 NOTE — ED Notes (Signed)
Sent for low HGB. Pt unable to state why here, thinks it is for swelling in hands.

## 2019-10-30 NOTE — H&P (Addendum)
[facility] [place] NAME: Jerry [name]    MR#:  [id number]  DATE OF BIRTH:  11-Apr-1956  DATE OF ADMISSION:  10/30/2019  PRIMARY CARE PHYSICIAN: [name], MD   REQUESTING/REFERRING PHYSICIAN: Dr Blake Divine  Patient coming from : [facility]-- is a long-term resident   CHIEF COMPLAINT:   Sent from the cancer center due to anemia/hemoglobin HISTORY OF PRESENT ILLNESS:  Jerry [name]  is a 63 y.o. 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  Patient care is low hemoglobin anywhere from 7.5--9.0.  He had heme positive stools in the emergency room. No bright red blood per rectum.  Spoke with patient's knees Jerry [name] on the phone. She does not remember patient having colonoscopy or EGD the past.  ER physician has ordered one unit of blood transfusion. In the ER patient is confused talking about going to [place] and to impeach the president and get his money back'!!  Patient is being admitted for further evaluation and is anemia.  Patient is a long-term resident at [facility]. At baseline he does not ambulate.  PAST MEDICAL HISTORY:   Past Medical History:  Diagnosis Date  . BPH (benign prostatic hyperplasia)   . CHF (congestive heart failure) ([place])   . Chronic pain   . COPD (chronic obstructive pulmonary disease) ([place])   . Hypertension   . Morbid obesity ([place])   . Pressure ulcer   . Rheumatoid arthritis ([place])   . Schizophrenia ([place])   . Thyroid disease     PAST SURGICAL HISTOIRY:  History reviewed. No pertinent surgical history.  SOCIAL HISTORY:   Social History   Tobacco Use  . Smoking status: Never Smoker  .  Smokeless tobacco: Never Used  Substance Use Topics  . Alcohol use: No    FAMILY HISTORY:   Family History  Family history unknown: Yes    DRUG ALLERGIES:   Allergies  Allergen Reactions  . Methadone   . Penicillins     Tolerates cephalosporins   . Propoxyphene   . Sulfa Antibiotics   . Valproic Acid And Related     Thrombocytopenia    REVIEW OF SYSTEMS:  Review of Systems  Unable to perform ROS: Psychiatric disorder     MEDICATIONS AT HOME:   Prior to Admission medications   Medication Sig Start Date End Date Taking? Authorizing Provider  albuterol (PROVENTIL HFA;VENTOLIN HFA) 108 (90 Base) MCG/ACT inhaler Inhale into the lungs every 3 (three) hours as needed for wheezing or shortness of breath.     [provider]  Amino Acids-Protein Hydrolys (FEEDING SUPPLEMENT, PRO-STAT SUGAR FREE 64,) LIQD Take 30 mLs by mouth 3 (three) times daily with meals. Sugar free    [provider]  ammonium lactate (LAC-HYDRIN) 12 % lotion Apply 1 application topically 2 (two) times daily. Apply topically to bilateral feet    [provider]  benztropine (COGENTIN) 0.5 MG tablet Take 0.5 mg by mouth 2 (two) times daily.    [provider]  docusate sodium (COLACE) 100 MG capsule Take 100 mg by mouth 2 (two) times daily.    [provider]  fluPHENAZine (PROLIXIN) 5 MG tablet Take 3 tablets (15 mg total)  by mouth daily. Patient taking differently: Take 17.5 mg by mouth daily.  07/29/19   Bettey Costa, MD  fluPHENAZine (PROLIXIN) 5 MG tablet Take 3 tablets (15 mg total) by mouth at bedtime. Patient taking differently: Take 17.5 mg by mouth at bedtime.  07/28/19   Bettey Costa, MD  guaifenesin (ROBITUSSIN) 100 MG/5ML syrup Take 100 mg by mouth every 4 (four) hours as needed for cough.    [provider]  hydrocortisone (CORTEF) 10 MG tablet Take 1 tablet (10 mg total) by mouth 2 (two) times daily. 12/30/17   Jerry Mandes, MD  hydroxychloroquine  (PLAQUENIL) 200 MG tablet Take 200 mg by mouth 2 (two) times daily.    [provider]  levothyroxine (SYNTHROID) 200 MCG tablet Take 200 mcg by mouth daily before breakfast.     [provider]  LORazepam (ATIVAN) 2 MG tablet Take 2 mg by mouth every 6 (six) hours as needed for anxiety.    [provider]  methocarbamol (ROBAXIN) 750 MG tablet Take 750 mg by mouth at bedtime.    [provider]  metoprolol tartrate (LOPRESSOR) 50 MG tablet Take 1 tablet (50 mg total) by mouth 2 (two) times daily. 07/28/19   Bettey Costa, MD  Multiple Vitamins-Minerals (MULTIVITAMIN WITH MINERALS) tablet Take 1 tablet by mouth daily.    [provider]  OXYGEN Inhale 2 L into the lungs continuous.    [provider]  Polyethylene Glycol 3350 (MIRALAX PO) Take 17 g by mouth daily.    [provider]  tamsulosin (FLOMAX) 0.4 MG CAPS capsule Take 0.4 mg by mouth daily.    [provider]  vitamin C (ASCORBIC ACID) 250 MG tablet Take 500 mg by mouth 2 (two) times daily.    [provider]      VITAL SIGNS:  Blood pressure (!) 177/92, pulse 74, temperature 98.3 F (36.8 C), temperature source Oral, resp. rate 20, height 6\' 2"  (1.88 m), weight (!) 153.3 kg, SpO2 95 %.  PHYSICAL EXAMINATION:  GENERAL:  63 y.o.-year-old patient lying in the bed with no acute distress. Morbidly obese EYES: Pupils equal, round, reactive to light and accommodation. No scleral icterus. Extraocular muscles intact.  LUNGS: Normal breath sounds bilaterally, no wheezing, rales,rhonchi or crepitation. No use of accessory muscles of respiration.  CARDIOVASCULAR: S1, S2 normal. No murmurs, rubs, or gallops.  ABDOMEN: Soft, nontender, nondistended. Bowel sounds present. No organomegaly or mass. Abdominal obesity EXTREMITIES: chronic bilateral pedal edema, cyanosis, or clubbing. Contractors both upper and lower extremity digits NEUROLOGIC: grossly intact. Moves all  extremities well PSYCHIATRIC: The patient is alert an awake.  SKIN: per RN documentation  LABORATORY PANEL:   CBC Recent Labs  Lab 10/30/19 1244  WBC 5.9  HGB 6.8*  HCT 23.7*  PLT 275   ------------------------------------------------------------------------------------------------------------------  Chemistries  Recent Labs  Lab 10/30/19 1244  NA 139  K 3.8  CL 103  CO2 29  GLUCOSE 96  BUN 12  CREATININE 0.49*  CALCIUM 8.6*   ------------------------------------------------------------------------------------------------------------------  Cardiac Enzymes No results for input(s): TROPONINI in the last 168 hours. ------------------------------------------------------------------------------------------------------------------  RADIOLOGY:  No results found.  EKG:    IMPRESSION AND PLAN:  Jerry [name]  is a 63 y.o. male with a known history of morbid obesity, severe rheumatoid arthritis on plaquenil with multiple contractures, CHF diastolic, [name], chronic anemia, chronic thrombocytopenia, chronic pain due to rheumatoid arthritis comes to the emergency room from cancer center after he was found to have  hemoglobin of 6.8.  1. Acute on chronic anemia with heme positive stools and history of severe rheumatoid arthritis -there is a possibility patient could be having slow G.I. bleed -med-list does not show any NSAIDs or blood thinner -patient getting one unit of blood transfusion -admission hemoglobin 6.8-- one unit blood transfusion -IV Protonix BID -G.I. consultation with Dr. Bonna Gains -pt has an alert on the chart "difficult airway" -- I believe secondary to his morbid obesity and rheumatoid arthritis that could have affected his cervical vertebrae -continue iron supplementation -?IV iron -patient is followed closely for his anemia at the cancer center by Dr. Rogue Bussing  2. Chronic schizophrenia -continue his psych meds  3. COPD on chronic home  oxygen -continue inhalers as needed  4. Chronic diastolic congestive heart failure EF 60% by echo in 2018. Currently does not appear to be in heart failure  5. Chronic thrombocytopenia -continue to monitor  6. BPH on Flomax  At baseline patient is bedbound. Social worker for discharge planning   Family Communication: spoke with patient's niece Jerry [name] on the phone Consults: G.I. Code Status: DNR-- prior to admission. Confirm with nieceKia who is HCPOA DVT prophylaxis:SCD due to TCP  TOTAL TIME TAKING CARE OF THIS PATIENT: *55 minutes.    Jerry [name] M.D on 10/30/2019 at 3:37 PM  Between 7am to 6pm - Pager - [phone]  After 6pm go to [email or web address] - password [email or web address] [facility]    CC: Primary care physician; [name], MD

## 2019-10-30 NOTE — ED Triage Notes (Signed)
Pt in via [facility].  Pt resides at [facility].  Pt is unable to recall why he is here.    Per [facility] RN, they received call today from pt's Oncologist that patient needs a blood transfusion due to last Hgb 7.1 10/08/19.    Pt A/Ox2; oriented to self and year, disoriented to place and situation.    NAD noted at this time.

## 2019-10-30 NOTE — Progress Notes (Signed)
[place] [facility] NOTE  Patient Care Team: [name], MD as PCP - General (Family Medicine) Jason Coop, NP as Nurse Practitioner (Hospice and Palliative Medicine)  CHIEF COMPLAINTS/PURPOSE OF CONSULTATION: Anemia/thrombocytopenia  #Acute on chronic severe anemia-normocytic hemoglobin around 7-8. ? poorly controlled rheumatoid arthritis. Aug 2020- iron 99991111 folic acid/LDH reticulocyte count-unremarkable.  #August 2020 CT scan #18 mm adrenal nodule atypical #urinary bladder thickening  # intermittent thrombocytopenia-improved status post steroids in the hospital question ITP versus others. CT-NEG- liver disease/splenomegaly  #Severe rheumatoid arthritis-Plaquenil/schizophrenia poorly controlled/[facility] resident/morbid obesity/COPD CHF-nasal cannula oxygen  Oncology History   No history exists.     HISTORY OF PRESENTING ILLNESS: Patient a poor historian.  I spoke to patient's nurse at [facility].  Jerry [name] 63 y.o.  male with multiple medical problems-including poorly controlled schizophrenia-poorly controlled rheumatoid arthritis/nursing home resident-and history of anemia is here for follow-up.  Patient is a poor historian-unable to give any sensible history.   As per the nursing home-patient has been fatigued.  Also noted to have worsening swelling of his bilateral extremities/lower extremities.  Most recent blood work November-hemoglobin was between 7-6.9.  He has not been transfused.  Review of Systems  Unable to perform ROS: Psychiatric disorder (Poorly controlled schizophrenia.)     MEDICAL HISTORY:  Past Medical History:  Diagnosis Date  . BPH (benign prostatic hyperplasia)   . CHF (congestive heart failure) ([place])   . Chronic pain   . COPD (chronic obstructive pulmonary disease) ([place])   . Hypertension   . Morbid obesity ([place])   . Pressure ulcer   . Rheumatoid arthritis ([place])   . Schizophrenia ([place])   . Thyroid  disease     SURGICAL HISTORY: No past surgical history on file.  SOCIAL HISTORY: Social History   Socioeconomic History  . Marital status: Single    Spouse name: Not on file  . Number of children: Not on file  . Years of education: Not on file  . Highest education level: Not on file  Occupational History  . Not on file  Social Needs  . Financial resource strain: Patient refused  . Food insecurity    Worry: Patient refused    Inability: Patient refused  . Transportation needs    Medical: No    Non-medical: No  Tobacco Use  . Smoking status: Never Smoker  . Smokeless tobacco: Never Used  Substance and Sexual Activity  . Alcohol use: No  . Drug use: No  . Sexual activity: Not Currently    Birth control/protection: None  Lifestyle  . Physical activity    Days per week: Patient refused    Minutes per session: Patient refused  . Stress: Only a little  Relationships  . Social Herbalist on phone: Patient refused    Gets together: Patient refused    Attends religious service: Patient refused    Active member of club or organization: Patient refused    Attends meetings of clubs or organizations: Patient refused    Relationship status: Patient refused  . Intimate partner violence    Fear of current or ex partner: Patient refused    Emotionally abused: Patient refused    Physically abused: Patient refused    Forced sexual activity: Patient refused  Other Topics Concern  . Not on file  Social History Narrative   Resident at [facility].    FAMILY HISTORY: Family History  Family history unknown: Yes    ALLERGIES:  is allergic  to methadone; penicillins; propoxyphene; sulfa antibiotics; and valproic acid and related.  MEDICATIONS:  No current facility-administered medications for this visit.    Current Outpatient Medications  Medication Sig Dispense Refill  . albuterol (PROVENTIL HFA;VENTOLIN HFA) 108 (90 Base) MCG/ACT inhaler Inhale 1 puff into the  lungs every 3 (three) hours as needed for wheezing or shortness of breath.     . Amino Acids-Protein Hydrolys (FEEDING SUPPLEMENT, PRO-STAT SUGAR FREE 64,) LIQD Take 30 mLs by mouth 3 (three) times daily with meals. Sugar free    . ammonium lactate (LAC-HYDRIN) 12 % lotion Apply 1 application topically 2 (two) times daily. Apply topically to bilateral feet    . benztropine (COGENTIN) 0.5 MG tablet Take 0.5 mg by mouth 2 (two) times daily.    Marland Kitchen docusate sodium (COLACE) 100 MG capsule Take 100 mg by mouth 2 (two) times daily.    . fluPHENAZine (PROLIXIN) 5 MG tablet Take 3 tablets (15 mg total) by mouth daily. 30 tablet 0  . fluPHENAZine (PROLIXIN) 5 MG tablet Take 3 tablets (15 mg total) by mouth at bedtime. 30 tablet 0  . guaifenesin (ROBITUSSIN) 100 MG/5ML syrup Take 100 mg by mouth every 4 (four) hours as needed for cough.    . hydrocortisone (CORTEF) 10 MG tablet Take 1 tablet (10 mg total) by mouth 2 (two) times daily. 20 tablet 0  . hydroxychloroquine (PLAQUENIL) 200 MG tablet Take 200 mg by mouth 2 (two) times daily.    Marland Kitchen levothyroxine (SYNTHROID) 200 MCG tablet Take 200 mcg by mouth daily before breakfast.     . LORazepam (ATIVAN) 2 MG tablet Take 2 mg by mouth every 6 (six) hours as needed for anxiety.    . methocarbamol (ROBAXIN) 750 MG tablet Take 750 mg by mouth at bedtime.    . metoprolol tartrate (LOPRESSOR) 50 MG tablet Take 1 tablet (50 mg total) by mouth 2 (two) times daily.    . Multiple Vitamins-Minerals (MULTIVITAMIN WITH MINERALS) tablet Take 1 tablet by mouth daily.    . Polyethylene Glycol 3350 (MIRALAX PO) Take 17 g by mouth daily.    . tamsulosin (FLOMAX) 0.4 MG CAPS capsule Take 0.4 mg by mouth daily.    . vitamin C (ASCORBIC ACID) 500 MG tablet Take 500 mg by mouth 2 (two) times daily.     Marland Kitchen acetaminophen (TYLENOL) 325 MG tablet Take 650 mg by mouth every 4 (four) hours as needed for mild pain or moderate pain.    . traMADol (ULTRAM) 50 MG tablet Take 50 mg by mouth every  6 (six) hours as needed for moderate pain or severe pain.     Facility-Administered Medications Ordered in Other Visits  Medication Dose Route Frequency Provider Last Rate Last Dose  . 0.9 %  sodium chloride infusion (Manually program via Guardrails IV Fluids)   Intravenous Once Blake Divine, MD      . pantoprazole (PROTONIX) injection 40 mg  40 mg Intravenous Q12H Fritzi Mandes, MD   40 mg at 10/30/19 1535      .  PHYSICAL EXAMINATION: ECOG PERFORMANCE STATUS: 1 - Symptomatic but completely ambulatory  Vitals:   10/30/19 1104  BP: (!) 172/106  Pulse: 82  Temp: 97.8 F (36.6 C)   Filed Weights    Physical Exam  Constitutional: He is oriented to person, place, and time and well-developed, well-nourished, and in no distress.  Patient is in chair.  O2 nasal cannula.  Morbidly obese.  HENT:  Head: Normocephalic and atraumatic.  Mouth/Throat: Oropharynx is clear and moist. No oropharyngeal exudate.  Eyes: Pupils are equal, round, and reactive to light.  Neck: Normal range of motion. Neck supple.  Cardiovascular: Normal rate and regular rhythm.  Pulmonary/Chest: No respiratory distress. He has no wheezes.  Decreased air entry bilaterally.  Abdominal: Soft. Bowel sounds are normal. He exhibits no distension and no mass. There is no abdominal tenderness. There is no rebound and no guarding.  Musculoskeletal: Normal range of motion.        General: Deformity (Deformity of his bilateral hands secondary to severe rheumatoid arthritis; severe swelling noted) and edema present. No tenderness.  Neurological: He is alert and oriented to person, place, and time.  Skin: Skin is warm.  Psychiatric:  Flight of ideas/flat affect.   LABORATORY DATA:  I have reviewed the data as listed Lab Results  Component Value Date   WBC 5.9 10/30/2019   HGB 6.8 (L) 10/30/2019   HCT 23.7 (L) 10/30/2019   MCV 79.0 (L) 10/30/2019   PLT 275 10/30/2019   Recent Labs    09/12/19 0000 09/13/19 0141  09/14/19 0123 10/30/19 1244  NA 142 138 140 139  K 3.6 3.6 4.0 3.8  CL 110 104 105 103  CO2 23 26 26 29   GLUCOSE 115* 99 107* 96  BUN 15 24* 23 12  CREATININE 0.51* 0.47* 0.49* 0.49*  CALCIUM 8.3* 8.0* 8.2* 8.6*  GFRNONAA >60 >60 >60 >60  GFRAA >60 >60 >60 >60  PROT 6.9 6.6 6.6  --   ALBUMIN 2.6* 2.6* 2.6*  --   AST 22 24 22   --   ALT 15 16 17   --   ALKPHOS 81 81 77  --   BILITOT 0.7 0.4 0.6  --     RADIOGRAPHIC STUDIES: I have personally reviewed the radiological images as listed and agreed with the findings in the report. No results found.  ASSESSMENT & PLAN:   Normocytic anemia #Acute on chronic severe anemia-normocytic hemoglobin around 7-8  Unclear etiology-question poorly controlled rheumatoid arthritis.   #Patient recent iron studies-is not clearly suggestive of iron deficiency; although occult blood loss is quite possible.  However given Hemoccult positive stools-GI evaluation is reasonable.  # Schizophrenia-poorly controlled; follow up with PCP.  #Rheumatoid arthritis-seems to poorly controlled swelling of the hands.  Will need rheumatology evaluation.  # Poor IV access-unable to draw labs.If patient needs blood work and follow-up-he will need like Mediport placement prior to discharge unless family decides on hospice/comfort measures only.   # Poor functional status-question palliative care evaluation; spoke to Dr. Lovie Macadamia- re: palliative care.  In agreement.  I would recommend palliative care evaluation.   # DISPOSITION: #Given the clinical status-recent low hemoglobin on 7.1 on November 11; I would recommend evaluation emergency room for further management and treatment of anemia.  I spoke to Dr. [name] at length re: above plan of care.  Addendum: Patient hemoglobin emergency room was 6.8.  Also possible Hemoccult stool.  Awaiting GI evaluation; 1 unit PRBC transfusion.  Discussed with Dr. Posey Pronto, hospitalist.  All questions were answered. The  patient knows to call the clinic with any problems, questions or concerns.    Cammie Sickle, MD 10/30/2019 5:48 PM

## 2019-10-30 NOTE — ED Provider Notes (Addendum)
[facility] Emergency Department Provider Note   ____________________________________________   None    (approximate)  I have reviewed the triage vital signs and the nursing notes.   HISTORY  Chief Complaint Abnormal Lab    HPI Jaylan Servatius is a 63 y.o. male with possible history of schizophrenia, rheumatoid arthritis, hypertension, COPD, and morbid obesity presents to the ED for abnormal labs.  History is limited due to patient's schizophrenia and associated psychosis.  Patient does not know why he is here but per EMS he was found to have a low hemoglobin and told by oncology he needs to be seen in the ED for blood transfusion.  He resides at [facility] and there is no report of bloody stool or emesis, patient denies any complaints at this time.  Patient currently thinks he is still at [facility], is asking for his laundry to be done.        Past Medical History:  Diagnosis Date  . BPH (benign prostatic hyperplasia)   . CHF (congestive heart failure) ([place])   . Chronic pain   . COPD (chronic obstructive pulmonary disease) ([place])   . Hypertension   . Morbid obesity ([place])   . Pressure ulcer   . Rheumatoid arthritis ([place])   . Schizophrenia ([place])   . Thyroid disease     Patient Active Problem List   Diagnosis Date Noted  . 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 COVID-19 virus infection 09/11/2019  . Normocytic anemia 08/25/2019  . Community acquired pneumonia   . DNR (do not resuscitate) discussion   . SOB (shortness of breath) 07/16/2019  . Obesity hypoventilation syndrome ([place])   . Schizophrenia ([place])   . Palliative care by specialist   . Advance care planning   . Goals of care, counseling/discussion   . CHF (congestive heart failure) ([place])   . Altered mental status   . Septic shock ([place]) 08/02/2017  . Pneumonia 06/21/2017  . Thrombocytopenia ([place])  06/21/2017  . Leukopenia 06/21/2017  . Pressure injury of skin 06/21/2017    History reviewed. No pertinent surgical history.  Prior to Admission medications   Medication Sig Start Date End Date Taking? Authorizing Provider  albuterol (PROVENTIL HFA;VENTOLIN HFA) 108 (90 Base) MCG/ACT inhaler Inhale into the lungs every 3 (three) hours as needed for wheezing or shortness of breath.     [provider]  Amino Acids-Protein Hydrolys (FEEDING SUPPLEMENT, PRO-STAT SUGAR FREE 64,) LIQD Take 30 mLs by mouth 3 (three) times daily with meals. Sugar free    [provider]  ammonium lactate (LAC-HYDRIN) 12 % lotion Apply 1 application topically 2 (two) times daily. Apply topically to bilateral feet    [provider]  benztropine (COGENTIN) 0.5 MG tablet Take 0.5 mg by mouth 2 (two) times daily.    [provider]  docusate sodium (COLACE) 100 MG capsule Take 100 mg by mouth 2 (two) times daily.    [provider]  fluPHENAZine (PROLIXIN) 5 MG tablet Take 3 tablets (15 mg total) by mouth daily. Patient taking differently: Take 17.5 mg by mouth daily.  07/29/19   Bettey Costa, MD  fluPHENAZine (PROLIXIN) 5 MG tablet Take 3 tablets (15 mg total) by mouth at bedtime. Patient taking differently: Take 17.5 mg by mouth at bedtime.  07/28/19   Bettey Costa, MD  guaifenesin (ROBITUSSIN) 100 MG/5ML syrup Take 100 mg by mouth every 4 (four) hours as needed  for cough.    [provider]  hydrocortisone (CORTEF) 10 MG tablet Take 1 tablet (10 mg total) by mouth 2 (two) times daily. 12/30/17   Fritzi Mandes, MD  hydroxychloroquine (PLAQUENIL) 200 MG tablet Take 200 mg by mouth 2 (two) times daily.    [provider]  levothyroxine (SYNTHROID) 200 MCG tablet Take 200 mcg by mouth daily before breakfast.     [provider]  LORazepam (ATIVAN) 2 MG tablet Take 2 mg by mouth every 6 (six) hours as needed for anxiety.    [provider]   methocarbamol (ROBAXIN) 750 MG tablet Take 750 mg by mouth at bedtime.    [provider]  metoprolol tartrate (LOPRESSOR) 50 MG tablet Take 1 tablet (50 mg total) by mouth 2 (two) times daily. 07/28/19   Bettey Costa, MD  Multiple Vitamins-Minerals (MULTIVITAMIN WITH MINERALS) tablet Take 1 tablet by mouth daily.    [provider]  OXYGEN Inhale 2 L into the lungs continuous.    [provider]  Polyethylene Glycol 3350 (MIRALAX PO) Take 17 g by mouth daily.    [provider]  tamsulosin (FLOMAX) 0.4 MG CAPS capsule Take 0.4 mg by mouth daily.    [provider]  vitamin C (ASCORBIC ACID) 250 MG tablet Take 500 mg by mouth 2 (two) times daily.    [provider]    Allergies Methadone, Penicillins, Propoxyphene, Sulfa antibiotics, and Valproic acid and related  Family History  Family history unknown: Yes    Social History Social History   Tobacco Use  . Smoking status: Never Smoker  . Smokeless tobacco: Never Used  Substance Use Topics  . Alcohol use: No  . Drug use: No    Review of Systems Unable to obtain secondary to psychosis  ____________________________________________   PHYSICAL EXAM:  VITAL SIGNS: ED Triage Vitals  Enc Vitals Group     BP 10/30/19 1221 (!) 151/102     Pulse Rate 10/30/19 1221 82     Resp 10/30/19 1221 19     Temp 10/30/19 1221 99 F (37.2 C)     Temp Source 10/30/19 1221 Oral     SpO2 10/30/19 1221 96 %     Weight 10/30/19 1238 (!) 338 lb (153.3 kg)     Height 10/30/19 1238 6\' 2"  (1.88 m)     Head Circumference --      [facility] --      Pain Score 10/30/19 1238 0     Pain Loc --      Pain Edu? --      Excl. in [place]? --     Constitutional: Alert and oriented to person and year, disoriented to place or situation.  Morbidly obese. Eyes: Conjunctivae are normal. Head: Atraumatic. Nose: No congestion/rhinnorhea. Mouth/Throat: Mucous membranes are moist. Neck: Normal ROM  Cardiovascular: Normal rate, regular rhythm. Grossly normal heart sounds. Respiratory: Normal respiratory effort.  No retractions. Lungs CTAB. Gastrointestinal: Soft and nontender. No distention.  Light brown guaiac positive stool. Genitourinary: deferred Musculoskeletal: No lower extremity tenderness nor edema. Neurologic:  Normal speech and language. No gross focal neurologic deficits are appreciated. Skin:  Skin is warm, dry and intact. No rash noted. Psychiatric: Disorganized thought content.  ____________________________________________   LABS (all labs ordered are listed, but only abnormal results are displayed)  Labs Reviewed  CBC - Abnormal; Notable for the following components:      Result Value   RBC 3.00 (*)  Hemoglobin 6.8 (*)    HCT 23.7 (*)    MCV 79.0 (*)    MCH 22.7 (*)    MCHC 28.7 (*)    RDW 17.4 (*)    All other components within normal limits  BASIC METABOLIC PANEL - Abnormal; Notable for the following components:   Creatinine, Ser 0.49 (*)    Calcium 8.6 (*)    All other components within normal limits  SARS CORONAVIRUS 2 (TAT 6-24 HRS)  TYPE AND SCREEN  PREPARE RBC (CROSSMATCH)     PROCEDURES  Procedure(s) performed (including Critical Care):  .Critical Care Performed by: Blake Divine, MD Authorized by: Blake Divine, MD   Critical care provider statement:    Critical care time (minutes):  45   Critical care time was exclusive of:  Separately billable procedures and treating other patients and teaching time   Critical care was time spent personally by me on the following activities:  Discussions with consultants, evaluation of patient's response to treatment, examination of patient, ordering and performing treatments and interventions, ordering and review of laboratory studies, ordering and review of radiographic studies, pulse oximetry, re-evaluation of patient's condition, obtaining history from patient or surrogate and review of old  charts   I assumed direction of critical care for this patient from another provider in my specialty: no       ____________________________________________   INITIAL IMPRESSION / ASSESSMENT AND PLAN / ED COURSE       63 year old male with history of schizophrenia, rheumatoid arthritis, presents to the ED after outpatient labs noted to have low hemoglobin.  Patient is unable to provide any significant history, is very confused and delusional, talks continually about getting his laundry done as well as going to the [facility].  He has a benign and nonfocal abdominal exam, however stool is guaiac positive.  It does not appear melanotic and I doubt acute upper GI bleed and given only slight drop in his hemoglobin from baseline, this would appear to be a slow bleed.  He is hemodynamically stable, will transfuse 1 unit of PRBCs.  He is not anticoagulated.  Case discussed with hospitalist, who accepts patient for admission.      ____________________________________________   FINAL CLINICAL IMPRESSION(S) / ED DIAGNOSES  Final diagnoses:  Gastrointestinal hemorrhage, unspecified gastrointestinal hemorrhage type  Anemia, unspecified type  Undifferentiated schizophrenia [facility])     ED Discharge Orders    None       Note:  This document was prepared using Dragon voice recognition software and may include unintentional dictation errors.   Blake Divine, MD 10/30/19 1456    Blake Divine, MD 10/30/19 [phone]

## 2019-10-30 NOTE — ED Notes (Addendum)
Pt informing RN that "blood is leaking around my heart and it is 2 inches thick.  [facility] I will [facility] you both [facility].  Can you wash my pants".  RN informed patient there is no washing machine here and they can wash when [facility] and pt states " I will buy you a $1700 washer and then you can wash them".  Pt asking for pizza and chicken tenders.  Explained he cannot eat or drink since will need to get blood and pt states " when make sure you boil my blood and iron out the wrinkles".

## 2019-10-30 NOTE — Assessment & Plan Note (Addendum)
#  Acute on chronic severe anemia-normocytic hemoglobin around 7-8  Unclear etiology-question poorly controlled rheumatoid arthritis.   #Patient recent iron studies-is not clearly suggestive of iron deficiency; although occult blood loss is quite possible.  However given Hemoccult positive stools-GI evaluation is reasonable.  # Schizophrenia-poorly controlled; follow up with PCP.  #Rheumatoid arthritis-seems to poorly controlled swelling of the hands.  Will need rheumatology evaluation.  # Poor IV access-unable to draw labs.If patient needs blood work and follow-up-he will need like Mediport placement prior to discharge unless family decides on hospice/comfort measures only.   # Poor functional status-question palliative care evaluation; spoke to Dr. Lovie Macadamia- re: palliative care.  In agreement.  I would recommend palliative care evaluation.   # DISPOSITION: #Given the clinical status-recent low hemoglobin on 7.1 on November 11; I would recommend evaluation emergency room for further management and treatment of anemia.  I spoke to Dr. [name] at length re: above plan of care.  Addendum: Patient hemoglobin emergency room was 6.8.  Also possible Hemoccult stool.  Awaiting GI evaluation; 1 unit PRBC transfusion.  Discussed with Dr. Posey Pronto, hospitalist.

## 2019-10-30 NOTE — Consult Note (Signed)
Jerry Antigua, MD [address], [place], [place], [place], [address] [address], [place], [place], [place], [address] Phone: [phone]  Fax: [phone]  Consultation  Referring Provider: Dr. Posey Pronto Primary Care Physician:  [name], MD Reason for Consultation:     Anemia  Date of Admission:  10/30/2019 Date of Consultation:  10/30/2019         HPI:   Jerry [name] is a 63 y.o. male with schizophrenia, poor historian, with GI being consulted for anemia with no signs of active GI bleeding.  Patient has chronic anemia and hemoglobin has been ranging from 8-10 for the past year.  Sees Dr. Rogue Bussing of hematology who attributed his anemia to rheumatoid arthritis since it was normocytic.  he has also had intermittent thrombocytopenia that improved post steroids in the hospital as per Dr. Aletha Halim notes.  CT scan did not show any liver disease or splenomegaly.  On this admission, MCV is low at 79.  Stool is heme positive.  Dr. Posey Pronto, primary attending spoke to patient's family and they do not remember him having EGD and colonoscopy in the past.  Past Medical History:  Diagnosis Date  . BPH (benign prostatic hyperplasia)   . CHF (congestive heart failure) ([place])   . Chronic pain   . COPD (chronic obstructive pulmonary disease) ([place])   . Hypertension   . Morbid obesity ([place])   . Pressure ulcer   . Rheumatoid arthritis ([place])   . Schizophrenia ([place])   . Thyroid disease     History reviewed. No pertinent surgical history.  Prior to Admission medications   Medication Sig Start Date End Date Taking? Authorizing Provider  acetaminophen (TYLENOL) 325 MG tablet Take 650 mg by mouth every 4 (four) hours as needed for mild pain or moderate pain.   Yes [provider]  albuterol (PROVENTIL HFA;VENTOLIN HFA) 108 (90 Base) MCG/ACT inhaler Inhale 1 puff into the lungs every 3 (three) hours as needed for wheezing or shortness of breath.    Yes [provider]  Amino Acids-Protein Hydrolys (FEEDING SUPPLEMENT, PRO-STAT SUGAR FREE 64,) LIQD Take 30 mLs by mouth 3 (three) times daily with meals. Sugar free   Yes [provider]  ammonium lactate (LAC-HYDRIN) 12 % lotion Apply 1 application topically 2 (two) times daily. Apply topically to bilateral feet   Yes [provider]  benztropine (COGENTIN) 0.5 MG tablet Take 0.5 mg by mouth 2 (two) times daily.   Yes [provider]  docusate sodium (COLACE) 100 MG capsule Take 100 mg by mouth 2 (two) times daily.   Yes [provider]  fluPHENAZine (PROLIXIN) 5 MG tablet Take 3 tablets (15 mg total) by mouth daily. 07/29/19  Yes Mody, Ulice Bold, MD  fluPHENAZine (PROLIXIN) 5 MG tablet Take 3 tablets (15 mg total) by mouth at bedtime. 07/28/19  Yes Mody, Ulice Bold, MD  guaifenesin (ROBITUSSIN) 100 MG/5ML syrup Take 100 mg by mouth every 4 (four) hours as needed for cough.   Yes [provider]  hydrocortisone (CORTEF) 10 MG tablet Take 1 tablet (10 mg total) by mouth 2 (two) times daily. 12/30/17  Yes Fritzi Mandes, MD  hydroxychloroquine (PLAQUENIL) 200 MG tablet Take 200 mg by mouth 2 (two) times daily.   Yes [provider]  levothyroxine (SYNTHROID) 200 MCG tablet Take 200 mcg by mouth daily before breakfast.    Yes [provider]  LORazepam (ATIVAN) 2 MG tablet Take 2 mg by mouth every 6 (six) hours as  needed for anxiety.   Yes [provider]  methocarbamol (ROBAXIN) 750 MG tablet Take 750 mg by mouth at bedtime.   Yes [provider]  metoprolol tartrate (LOPRESSOR) 50 MG tablet Take 1 tablet (50 mg total) by mouth 2 (two) times daily. 07/28/19  Yes Bettey Costa, MD  Multiple Vitamins-Minerals (MULTIVITAMIN WITH MINERALS) tablet Take 1 tablet by mouth daily.   Yes [provider]  Polyethylene Glycol 3350 (MIRALAX PO) Take 17 g by mouth daily.   Yes [provider]  tamsulosin (FLOMAX) 0.4 MG CAPS capsule Take  0.4 mg by mouth daily.   Yes [provider]  traMADol (ULTRAM) 50 MG tablet Take 50 mg by mouth every 6 (six) hours as needed for moderate pain or severe pain.   Yes [provider]  vitamin C (ASCORBIC ACID) 500 MG tablet Take 500 mg by mouth 2 (two) times daily.    Yes [provider]    Family History  Family history unknown: Yes     Social History   Tobacco Use  . Smoking status: Never Smoker  . Smokeless tobacco: Never Used  Substance Use Topics  . Alcohol use: No  . Drug use: No    Allergies as of 10/30/2019 - Review Complete 10/30/2019  Allergen Reaction Noted  . Methadone  06/21/2017  . Penicillins  06/21/2017  . Propoxyphene  06/21/2017  . Sulfa antibiotics  06/21/2017  . Valproic acid and related  08/06/2017    Review of Systems:    All systems reviewed and negative except where noted in HPI.   Physical Exam:  Vital signs in last 24 hours: Vitals:   10/30/19 1425 10/30/19 1500 10/30/19 1529 10/30/19 1555  BP: (!) 163/98 (!) 169/86 (!) 177/92 (!) 152/94  Pulse: 91 78 74 73  Resp: (!) 26 15 20 19   Temp:   98.3 F (36.8 C) 98.4 F (36.9 C)  TempSrc:   Oral Oral  SpO2: 96% 97% 95% 97%  Weight:      Height:         General:   Pleasant, cooperative in NAD Head:  Normocephalic and atraumatic. Eyes:   No icterus.   Conjunctiva pink. PERRLA. Ears:  Normal auditory acuity. Neck:  Supple; no masses or thyroidomegaly Lungs: Respirations even and unlabored. Lungs clear to auscultation bilaterally.   No wheezes, crackles, or rhonchi.  Abdomen:  Soft, nondistended, nontender. Normal bowel sounds. No appreciable masses or hepatomegaly.  No rebound or guarding.  Neurologic:  Alert and oriented x3;  grossly normal neurologically. Skin:  Intact without significant lesions or rashes. Cervical Nodes:  No significant cervical adenopathy. Psych:  Alert and cooperative. Normal affect.  LAB RESULTS: Recent Labs    10/30/19 1244  WBC 5.9   HGB 6.8*  HCT 23.7*  PLT 275   BMET Recent Labs    10/30/19 1244  NA 139  K 3.8  CL 103  CO2 29  GLUCOSE 96  BUN 12  CREATININE 0.49*  CALCIUM 8.6*   LFT No results for input(s): PROT, ALBUMIN, AST, ALT, ALKPHOS, BILITOT, BILIDIR, IBILI in the last 72 hours. PT/INR No results for input(s): LABPROT, INR in the last 72 hours.  STUDIES: No results found.    Impression / Plan:   Supreme Cefalo is a 63 y.o. y/o male with acute on chronic anemia with no evidence of active GI bleeding  His anemia has previously been attributed to severe rheumatoid arthritis by hematology  However, he has never  had an EGD or colonoscopy and anemia has worsened without any signs of active GI bleeding and was now microcytic  In addition stool is heme positive  However, patient has an alert on the chart that says difficult airway.  He has morbid obesity.  Patient will need to be evaluated by anesthesia to see if he would be appropriate for endoscopy with sedation  If anesthesia agrees, can proceed with upper endoscopy tomorrow and if negative, colonoscopy subsequently in the upcoming days  PPI IV twice daily  Continue serial CBCs and transfuse PRN Avoid NSAIDs Maintain 2 large-bore IV lines Please page GI with any acute hemodynamic changes, or signs of active GI bleeding  No indication for emergent endoscopy at this time given no evidence of active GI bleeding  Check iron labs and replace if needed  Thank you for involving me in the care of this patient.      LOS: 0 days   Virgel Manifold, MD  10/30/2019, 4:19 PM

## 2019-10-31 ENCOUNTER — Inpatient Hospital Stay: Payer: Medicaid Other | Admitting: Anesthesiology

## 2019-10-31 ENCOUNTER — Encounter: Payer: Self-pay | Admitting: Anesthesiology

## 2019-10-31 ENCOUNTER — Encounter
Admission: EM | Disposition: A | Discharge status: Skilled Nursing Facility | Payer: Self-pay | Source: Skilled Nursing Facility | Attending: Internal Medicine

## 2019-10-31 DIAGNOSIS — D696 Thrombocytopenia, unspecified: Secondary | ICD-10-CM

## 2019-10-31 DIAGNOSIS — F259 Schizoaffective disorder, unspecified: Secondary | ICD-10-CM

## 2019-10-31 HISTORY — PX: ESOPHAGOGASTRODUODENOSCOPY (EGD) WITH PROPOFOL: SHX5813

## 2019-10-31 LAB — CBC
HCT: 26.6 % — ABNORMAL LOW (ref 39.0–52.0)
Hemoglobin: 7.8 g/dL — ABNORMAL LOW (ref 13.0–17.0)
MCH: 23.1 pg — ABNORMAL LOW (ref 26.0–34.0)
MCHC: 29.3 g/dL — ABNORMAL LOW (ref 30.0–36.0)
MCV: 78.9 fL — ABNORMAL LOW (ref 80.0–100.0)
Platelets: 249 10*3/uL (ref 150–400)
RBC: 3.37 MIL/uL — ABNORMAL LOW (ref 4.22–5.81)
RDW: 16.8 % — ABNORMAL HIGH (ref 11.5–15.5)
WBC: 5.5 10*3/uL (ref 4.0–10.5)
nRBC: 0 % (ref 0.0–0.2)

## 2019-10-31 LAB — BPAM RBC
Blood Product Expiration Date: 202012302359
ISSUE DATE / TIME: 202012031524
Unit Type and Rh: 7300

## 2019-10-31 LAB — TYPE AND SCREEN
ABO/RH(D): B POS
Antibody Screen: NEGATIVE
Unit division: 0

## 2019-10-31 LAB — MRSA PCR SCREENING: MRSA by PCR: POSITIVE — AB

## 2019-10-31 LAB — SARS CORONAVIRUS 2 (TAT 6-24 HRS): SARS Coronavirus 2: NEGATIVE

## 2019-10-31 SURGERY — ESOPHAGOGASTRODUODENOSCOPY (EGD) WITH PROPOFOL
Anesthesia: General

## 2019-10-31 SURGERY — EGD (ESOPHAGOGASTRODUODENOSCOPY)
Anesthesia: General

## 2019-10-31 MED ORDER — FERROUS SULFATE 325 (65 FE) MG PO TABS
325.0000 mg | ORAL_TABLET | Freq: Two times a day (BID) | ORAL | Status: DC
  Administered 2019-10-31 – 2019-11-20 (×33): 325 mg via ORAL
  Filled 2019-10-31 (×35): qty 1

## 2019-10-31 MED ORDER — SODIUM CHLORIDE 0.9 % IV SOLN: INTRAVENOUS | Status: DC

## 2019-10-31 MED ORDER — PANTOPRAZOLE SODIUM 40 MG PO TBEC
40.0000 mg | DELAYED_RELEASE_TABLET | Freq: Every day | ORAL | Status: DC
  Administered 2019-11-01 – 2019-11-18 (×17): 40 mg via ORAL
  Filled 2019-10-31 (×18): qty 1

## 2019-10-31 MED ORDER — LIDOCAINE HCL (PF) 2 % IJ SOLN
INTRAMUSCULAR | Status: AC
  Filled 2019-10-31: qty 10

## 2019-10-31 MED ORDER — PEG 3350-KCL-NA BICARB-NACL 420 G PO SOLR
4000.0000 mL | Freq: Once | ORAL | Status: AC
  Administered 2019-11-01: 4000 mL via ORAL
  Filled 2019-10-31: qty 4000

## 2019-10-31 MED ORDER — LIDOCAINE HCL (CARDIAC) PF 100 MG/5ML IV SOSY
PREFILLED_SYRINGE | INTRAVENOUS | Status: DC | PRN
  Administered 2019-10-31: 80 mg via INTRAVENOUS

## 2019-10-31 MED ORDER — PROPOFOL 10 MG/ML IV BOLUS
INTRAVENOUS | Status: AC
  Filled 2019-10-31: qty 80

## 2019-10-31 MED ORDER — PROPOFOL 10 MG/ML IV BOLUS
INTRAVENOUS | Status: DC | PRN
  Administered 2019-10-31: 30 mg via INTRAVENOUS
  Administered 2019-10-31 (×2): 20 mg via INTRAVENOUS

## 2019-10-31 MED ORDER — PEG 3350-KCL-NA BICARB-NACL 420 G PO SOLR: 4000.0000 mL | Freq: Once | ORAL | Status: DC

## 2019-10-31 MED ORDER — SODIUM CHLORIDE 0.9 % IV SOLN
INTRAVENOUS | Status: DC
  Administered 2019-10-31: 14:00:00 via INTRAVENOUS

## 2019-10-31 NOTE — Progress Notes (Signed)
Jerry Antigua, MD [address], [place], [place], [place], [address] [address], [place], [place], [place], [address] Phone: [phone]  Fax: [phone]   Subjective: No active bleeding. Family consented pt for procedure   Objective: Exam: Vital signs in last 24 hours: Vitals:   10/31/19 1100 10/31/19 1130 10/31/19 1230 10/31/19 1325  BP: (!) 150/109 (!) 153/108 (!) 142/89 (!) 154/72  Pulse: 81 85 76 86  Resp: 14 17  16   Temp:    (!) 96.9 F (36.1 C)  TempSrc:    Temporal  SpO2: 99% 100% 98% 100%  Weight:    (!) 153.3 kg  Height:    6\' 2"  (1.88 m)   Weight change:   Intake/Output Summary (Last 24 hours) at 10/31/2019 1346 Last data filed at 10/31/2019 1158 Gross per 24 hour  Intake 707.16 ml  Output 850 ml  Net -142.84 ml    General: No acute distress, AAO x3 Abd: Soft, NT/ND, No HSM Skin: Warm, no rashes Neck: Supple, Trachea midline   Lab Results: Lab Results  Component Value Date   WBC 5.5 10/31/2019   HGB 7.8 (L) 10/31/2019   HCT 26.6 (L) 10/31/2019   MCV 78.9 (L) 10/31/2019   PLT 249 10/31/2019   Micro Results: Recent Results (from the past 240 hour(s))  SARS CORONAVIRUS 2 (TAT 6-24 HRS) Nasopharyngeal Nasopharyngeal Swab     Status: None   Collection Time: 10/30/19  4:02 PM   Specimen: Nasopharyngeal Swab  Result Value Ref Range Status   SARS Coronavirus 2 NEGATIVE NEGATIVE Final    Comment: (NOTE) SARS-CoV-2 target nucleic acids are NOT DETECTED. The SARS-CoV-2 RNA is generally detectable in upper and lower respiratory specimens during the acute phase of infection. Negative results do not preclude SARS-CoV-2 infection, do not rule out co-infections with other pathogens, and should not be used as the sole basis for treatment or other patient management decisions. Negative results must be combined with clinical observations, patient history, and epidemiological information. The expected result is Negative. Fact Sheet for  Patients: SugarRoll.be Fact Sheet for Healthcare Providers: https://www.woods-mathews.com/ This test is not yet approved or cleared by the [place] FDA and  has been authorized for detection and/or diagnosis of SARS-CoV-2 by FDA under an Emergency Use Authorization (EUA). This EUA will remain  in effect (meaning this test can be used) for the duration of the COVID-19 declaration under Section 56 4(b)(1) of the Act, 21 U.S.C. section 360bbb-3(b)(1), unless the authorization is terminated or revoked sooner. Performed at [facility], [place] [address], [place], [place] [address]    Studies/Results: No results found. Medications:  Scheduled Meds: . [MAR Hold] benztropine  0.5 mg Oral BID  . [MAR Hold] docusate sodium  100 mg Oral BID  . [MAR Hold] fluPHENAZine  15 mg Oral QHS  . [MAR Hold] fluPHENAZine  15 mg Oral Daily  . [MAR Hold] hydrocortisone  10 mg Oral BID  . [MAR Hold] hydroxychloroquine  200 mg Oral BID  . [MAR Hold] levothyroxine  200 mcg Oral QAC breakfast  . [MAR Hold] metoprolol tartrate  50 mg Oral BID  . [MAR Hold] multivitamin with minerals  1 tablet Oral Daily  . [MAR Hold] pantoprazole (PROTONIX) IV  40 mg Intravenous Q12H  . [MAR Hold] polyethylene glycol  17 g Oral Daily  . [MAR Hold] senna  1 tablet Oral BID  . [MAR Hold] tamsulosin  0.4 mg Oral Daily  . [MAR Hold] vitamin C  500  mg Oral BID   Continuous Infusions: . sodium chloride     PRN Meds:.[MAR Hold] acetaminophen **OR** [MAR Hold] acetaminophen, [MAR Hold] albuterol, [MAR Hold] guaiFENesin, [MAR Hold] LORazepam, [MAR Hold] ondansetron **OR** [MAR Hold] ondansetron (ZOFRAN) IV, [MAR Hold] polyethylene glycol, [MAR Hold] traMADol   Assessment: Active Problems:   Anemia    Plan: Continue with EGD today If negatice colonoscopy tomorrow  Anesthesia has evaluated the pt and is agreeable to proceeding with procedures as well  I have discussed  alternative options, risks & benefits,  which include, but are not limited to, bleeding, infection, perforation,respiratory complication & drug reaction.  The patient agrees with this plan & written consent will be obtained.      LOS: 1 day   Jerry Antigua, MD 10/31/2019, 1:46 PM

## 2019-10-31 NOTE — ED Notes (Signed)
This RN to bedside at this time. Explained to patient possible procedure and due to such pt would be NPO, pt states understanding. Pt repositioned in bed at this time. Pt continues to rest in bed with NAD noted, watching TV. Call bell within reach. Will continue to monitor for further patient needs.

## 2019-10-31 NOTE — Anesthesia Post-op Follow-up Note (Signed)
Anesthesia QCDR form completed.        

## 2019-10-31 NOTE — ED Notes (Signed)
This RN spoke with Endo RN per Endo RN, will send for patient when GI doc obtains consent via telephone.

## 2019-10-31 NOTE — Transfer of Care (Signed)
Immediate Anesthesia Transfer of Care Note  Patient: Jerry [name]  Procedure(s) Performed: ESOPHAGOGASTRODUODENOSCOPY (EGD) WITH PROPOFOL (N/A )  Patient Location: PACU and Endoscopy Unit  Anesthesia Type:General  Level of Consciousness: awake and patient cooperative  Airway & Oxygen Therapy: Patient Spontanous Breathing and Patient connected to nasal cannula oxygen  Post-op Assessment: Report given to RN and Post -op Vital signs reviewed and stable  Post vital signs: Reviewed and stable  Last Vitals:  Vitals Value Taken Time  BP 126/60 10/31/19 1404  Temp 35.9 C 10/31/19 1404  Pulse 82 10/31/19 1409  Resp 17 10/31/19 1409  SpO2 98 % 10/31/19 1409  Vitals shown include unvalidated device data.  Last Pain:  Vitals:   10/31/19 1404  TempSrc: Temporal  PainSc: 0-No pain         Complications: No apparent anesthesia complications

## 2019-10-31 NOTE — ED Notes (Signed)
This RN reviewed chart, spoke with admitting MD, procedure pass noted for EGD today 12/4, admitting MD messaged and VORB for NPO.

## 2019-10-31 NOTE — Anesthesia Postprocedure Evaluation (Signed)
Anesthesia Post Note  Patient: Jerry [name]  Procedure(s) Performed: ESOPHAGOGASTRODUODENOSCOPY (EGD) WITH PROPOFOL (N/A )  Patient location during evaluation: Endoscopy Anesthesia Type: General Level of consciousness: awake and alert Pain management: pain level controlled Vital Signs Assessment: post-procedure vital signs reviewed and stable Respiratory status: spontaneous breathing, nonlabored ventilation, respiratory function stable and patient connected to nasal cannula oxygen Cardiovascular status: blood pressure returned to baseline and stable Postop Assessment: no apparent nausea or vomiting Anesthetic complications: no     Last Vitals:  Vitals:   10/31/19 1434 10/31/19 1444  BP: (!) 178/85 (!) 175/89  Pulse: 85 83  Resp: 17 14  Temp:    SpO2: 100% 98%    Last Pain:  Vitals:   10/31/19 1444  TempSrc:   PainSc: 0-No pain                 Precious Haws Cliffie Gingras

## 2019-10-31 NOTE — Anesthesia Preprocedure Evaluation (Addendum)
Anesthesia Evaluation  Patient identified by MRN, date of birth, ID band Patient awake    Reviewed: Allergy & Precautions, H&P , NPO status , Patient's Chart, lab work & pertinent test results  History of Anesthesia Complications Negative for: history of anesthetic complications  Airway Mallampati: III  TM Distance: >3 FB Neck ROM: limited    Dental  (+) Chipped, Poor Dentition, Loose   Pulmonary pneumonia, COPD,           Cardiovascular Exercise Tolerance: Good hypertension, (-) angina+CHF  (-) Past MI      Neuro/Psych PSYCHIATRIC DISORDERS negative neurological ROS     GI/Hepatic negative GI ROS, Neg liver ROS, neg GERD  ,  Endo/Other  negative endocrine ROS  Renal/GU negative Renal ROS  negative genitourinary   Musculoskeletal  (+) Arthritis ,   Abdominal   Peds  Hematology negative hematology ROS (+)   Anesthesia Other Findings Patient is NPO appropriate and reports no nausea or vomiting today.  Past Medical History: No date: BPH (benign prostatic hyperplasia) No date: CHF (congestive heart failure) (HCC) No date: Chronic pain No date: COPD (chronic obstructive pulmonary disease) (HCC) No date: Hypertension No date: Morbid obesity ([place]) No date: Pressure ulcer No date: Rheumatoid arthritis ([place]) No date: Schizophrenia ([place]) No date: Thyroid disease  History reviewed. No pertinent surgical history.  BMI    Body Mass Index: 43.40 kg/m      Reproductive/Obstetrics negative OB ROS                           Anesthesia Physical Anesthesia Plan  ASA: III  Anesthesia Plan: General   Post-op Pain Management:    Induction: Intravenous  PONV Risk Score and Plan: Propofol infusion and TIVA  Airway Management Planned: Natural Airway and Nasal Cannula  Additional Equipment:   Intra-op Plan:   Post-operative Plan:   Informed Consent: I have reviewed the patients  History and Physical, chart, labs and discussed the procedure including the risks, benefits and alternatives for the proposed anesthesia with the patient or authorized representative who has indicated his/her understanding and acceptance.   Patient has DNR.  Discussed DNR with patient, Discussed DNR with power of attorney and Suspend DNR.   Dental Advisory Given  Plan Discussed with: Anesthesiologist, CRNA and Surgeon  Anesthesia Plan Comments: (History and phone consent from patients legal guardian [name] at [phone]   She was consented for risks of anesthesia including but not limited to:  - adverse reactions to medications - risk of intubation if required - damage to teeth, lips or other oral mucosa - sore throat or hoarseness - Damage to heart, brain, lungs or loss of life  She voiced understanding.)       Anesthesia Quick Evaluation

## 2019-10-31 NOTE — ED Notes (Signed)
This RN to bedside, introduced self to patient. Pt requesting meal tray. This RN apologized for meal trays not being here. Pt states understanding. Will continue to monitor for further patient needs.

## 2019-10-31 NOTE — ED Notes (Signed)
Pt awakens easily upon this RN arrival to bedside. Pt noted to continue to be confused at this time, states to this RN, "you don't have to worry, Arman Bogus tells me all, it's okay, Arman Bogus tells me all".

## 2019-10-31 NOTE — Progress Notes (Signed)
Telephone consent from Legal guardian obtained.

## 2019-10-31 NOTE — ED Notes (Signed)
This RN to bedside due to patient repeatedly calling out. Pt requesting this RN move chair "so that [he] can stand on that counter over there". Bed alarm turned on for patient, pt readjusted in bed, TV turned on for patient comfort. Will continue to monitor for further patient needs.

## 2019-10-31 NOTE — Progress Notes (Signed)
[place] at [place] NAME: Jerry [name]    MR#:  [id number]  DATE OF BIRTH:  01/08/1956  SUBJECTIVE:   Patient sleeping earlier in the ER. No new issues per RN. He is status post one unit blood transfusion. No active bleeding noted  REVIEW OF SYSTEMS:   Review of Systems  Unable to perform ROS: Psychiatric disorder   Tolerating Diet: Tolerating PT: does not walk  DRUG ALLERGIES:   Allergies  Allergen Reactions  . Methadone   . Penicillins     Tolerates cephalosporins   . Propoxyphene   . Sulfa Antibiotics   . Valproic Acid And Related     Thrombocytopenia    VITALS:  Blood pressure (!) 185/146, pulse 84, temperature (!) 96.6 F (35.9 C), temperature source Temporal, resp. rate 15, height 6\' 2"  (1.88 m), weight (!) 153.3 kg, SpO2 99 %.  PHYSICAL EXAMINATION:   Physical Exam  GENERAL:  63 y.o.-year-old patient lying in the bed with no acute distress. Morbidly obese chronically ill EYES: Pupils equal, round, reactive to light and accommodation. No scleral icterus. Extraocular muscles intact.  HEENT: Head atraumatic, normocephalic. Oropharynx and nasopharynx clear.  NECK:  Supple, no jugular venous distention. No thyroid enlargement, no tenderness.  LUNGS: Normal breath sounds bilaterally, no wheezing, rales, rhonchi. No use of accessory muscles of respiration.  CARDIOVASCULAR: S1, S2 normal. No murmurs, rubs, or gallops.  ABDOMEN: Soft, nontender, nondistended. Bowel sounds present. No organomegaly or mass.  EXTREMITIES: bilateral chronic edema b/l.   Chronic contractures lower extremity NEUROLOGIC: unable to assess however moves all extremities spontaneously PSYCHIATRIC:  patient is alert but confused at baseline SKIN: No obvious rash, lesion, or ulcer. Per RN  LABORATORY PANEL:  CBC Recent Labs  Lab 10/31/19 0516  WBC 5.5  HGB 7.8*  HCT 26.6*  PLT 249    Chemistries  Recent Labs  Lab 10/30/19 1244  NA 139  K  3.8  CL 103  CO2 29  GLUCOSE 96  BUN 12  CREATININE 0.49*  CALCIUM 8.6*   Cardiac Enzymes No results for input(s): TROPONINI in the last 168 hours. RADIOLOGY:  No results found. ASSESSMENT AND PLAN:  Avian Gaster  is a 63 y.o. 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  1.Acute on chronic anemia with heme positive stools and history of severe rheumatoid arthritis -there is a possibility patient could be having slow G.I. bleed -med-list does not show any NSAIDs or blood thinner -patient s/pone unit of blood transfusion -admission hemoglobin 6.8-- one unit blood transfusion--8.0--7.8 -IV Protonix BID--change to po PPI -G.I. consultation with Dr. Bonna Gains initiated -EGD-- within normal limits -colonoscopy-- land for Sunday -continue iron supplementation -patient is followed closely for his anemia at the cancer center by Dr. Rogue Bussing  2. Chronic schizophrenia -continue his psych meds  3. COPD on chronic home oxygen -continue inhalers as needed  4. Chronic diastolic congestive heart failure EF 60% by echo in 2018. Currently does not appear to be in heart failure  5. Chronic thrombocytopenia -continue to monitor  6. BPH on Flomax  At baseline patient is bedbound. Social worker for discharge planning back to his long-term facility   Family Communication: spoke with patient's niece [name] on  the phone Consults: G.I. Code Status: DNR-- prior to admission. Confirm with nieceKia who is HCPOA DVT prophylaxis:SCD due to TCP  TOTAL TIME TAKING CARE OF THIS PATIENT: *30* minutes.  >50% time spent on counselling and coordination of care  POSSIBLE D/C IN **1 to 3* DAYS, DEPENDING ON CLINICAL  CONDITION.  Note: This dictation was prepared with Dragon dictation along with smaller phrase technology. Any transcriptional errors that result from this process are unintentional.  Fritzi Mandes M.D on 10/31/2019 at 3:23 PM  Between 7am to 6pm - Pager - [phone]  After 6pm go to [email or web address]  [facility]   CC: Primary care physician; [name], MDPatient ID: Jerry [name], male   DOB: June 28, 1956, 63 y.o.   MRN: [id number]

## 2019-10-31 NOTE — ED Notes (Signed)
Pt moved to a hospital bed for comfort, foley bag emptied.  Posterior skin examined and extra linen removed.

## 2019-10-31 NOTE — ED Notes (Addendum)
Pt noted to have soiled sheets after misplacing condom cath. This RN and Opal Sidles, RN placed patient onto clean sheets and a new condom cath applied by this RN. Pt repositioned in bed at this time by this RN and Opal Sidles, Therapist, sports.

## 2019-10-31 NOTE — Progress Notes (Signed)
Attempted to reach Ms. Ray, patient's legal guardian, to set up password.  Unable to leave VM.

## 2019-10-31 NOTE — Care Management (Signed)
ED RN CM: Call to [facility] states pt will be returning there as LTC resident once medically stable. Will require COVID test prior to discharge and Otila Kluver must be notified @ [phone] prior to transfer back in order to get new bed assignment due to quarantine need in facility.

## 2019-10-31 NOTE — Op Note (Signed)
[facility] Gastroenterology Patient Name: Jerry [name] Procedure Date: 10/31/2019 1:41 PM MRN: [id number] Account #: [id number] Date of Birth: 06/16/1956 Admit Type: Outpatient Age: 63 Room: [facility] ENDO ROOM 4 Gender: Male Note Status: Finalized Procedure:             Upper GI endoscopy Indications:           Iron deficiency anemia Providers:             Varnita B. Bonna Gains MD, MD Referring MD:          Forest Gleason Md, MD (Referring MD) Medicines:             Monitored Anesthesia Care Complications:         No immediate complications. Procedure:             Pre-Anesthesia Assessment:                        - Prior to the procedure, a History and Physical was                         performed, and patient medications, allergies and                         sensitivities were reviewed. The patient's tolerance                         of previous anesthesia was reviewed.                        - The risks and benefits of the procedure and the                         sedation options and risks were discussed with the                         patient. All questions were answered and informed                         consent was obtained.                        - Patient identification and proposed procedure were                         verified prior to the procedure by the physician, the                         nurse, the anesthesiologist, the anesthetist and the                         technician. The procedure was verified in the                         procedure room.                        - ASA Grade Assessment: III - A patient with severe                         systemic  disease.                        After obtaining informed consent, the endoscope was                         passed under direct vision. Throughout the procedure,                         the patient's blood pressure, pulse, and oxygen                         saturations were monitored continuously. The  Endoscope                         was introduced through the mouth, and advanced to the                         second part of duodenum. The upper GI endoscopy was                         accomplished with ease. The patient tolerated the                         procedure well. Findings:      The examined esophagus was normal.      The entire examined stomach was normal.      The duodenal bulb, second portion of the duodenum and examined duodenum       were normal. Impression:            - Normal esophagus.                        - Normal stomach.                        - Normal duodenal bulb, second portion of the duodenum                         and examined duodenum.                        - No specimens collected. Recommendation:        - Perform a colonoscopy tomorrow.                        - Clear liquid diet.                        - Continue present medications.                        - Patient has a contact number available for                         emergencies. The signs and symptoms of potential                         delayed complications were discussed with the patient.  Return to normal activities tomorrow. Written                         discharge instructions were provided to the patient.                        - The findings and recommendations were discussed with                         the patient.                        - The findings and recommendations were discussed with                         the patient's family. Procedure Code(s):     --- Professional ---                        [phone], Esophagogastroduodenoscopy, flexible,                         transoral; diagnostic, including collection of                         specimen(s) by brushing or washing, when performed                         (separate procedure) Diagnosis Code(s):     --- Professional ---                        D50.9, Iron deficiency anemia, unspecified CPT copyright  2019 American Medical Association. All rights reserved. The codes documented in this report are preliminary and upon coder review may  be revised to meet current compliance requirements.  [name], MD Margretta Sidle B. Bonna Gains MD, MD 10/31/2019 2:01:03 PM This report has been signed electronically. Number of Addenda: 0 Note Initiated On: 10/31/2019 1:41 PM Estimated Blood Loss:  Estimated blood loss: none.      [facility]

## 2019-11-01 LAB — HEMOGLOBIN AND HEMATOCRIT, BLOOD
HCT: 25.3 % — ABNORMAL LOW (ref 39.0–52.0)
Hemoglobin: 7.5 g/dL — ABNORMAL LOW (ref 13.0–17.0)

## 2019-11-01 MED ORDER — MUPIROCIN 2 % EX OINT
1.0000 "application " | TOPICAL_OINTMENT | Freq: Two times a day (BID) | CUTANEOUS | Status: DC
  Administered 2019-11-01 – 2019-11-05 (×9): 1 via NASAL
  Filled 2019-11-01: qty 22

## 2019-11-01 MED ORDER — CHLORHEXIDINE GLUCONATE CLOTH 2 % EX PADS
6.0000 | MEDICATED_PAD | Freq: Every day | CUTANEOUS | Status: DC
  Administered 2019-11-02 – 2019-11-05 (×3): 6 via TOPICAL

## 2019-11-01 NOTE — Progress Notes (Signed)
Jerry Lame, MD [facility]   [address], [place], [place] [address] Phone: [phone] Fax : [phone]   Subjective: This patient is being seen for anemia.  The patient had an upper endoscopy without any source of the anemia found.  The patient is set up for a colonoscopy for tomorrow to look for any sign of bleeding.  The patient has been set up for a colonoscopy for tomorrow.  The patient's hemoglobin has gone down slightly without any overt sign of bleeding.   Objective: Vital signs in last 24 hours: Vitals:   10/31/19 1604 10/31/19 1614 10/31/19 1715 11/01/19 0516  BP: (!) 169/88 (!) 169/77 (!) 147/73 132/62  Pulse: 83 84 85 85  Resp: 14 17  20   Temp:   98.6 F (37 C) 97.6 F (36.4 C)  TempSrc:   Oral Oral  SpO2: 98% 97% 97% 100%  Weight:      Height:       Weight change: 0 kg  Intake/Output Summary (Last 24 hours) at 11/01/2019 1100 Last data filed at 11/01/2019 0900 Gross per 24 hour  Intake 1387.53 ml  Output 0 ml  Net 1387.53 ml     Exam: Heart:: Regular rate and rhythm, S1S2 present or without murmur or extra heart sounds Lungs: normal and clear to auscultation and percussion Abdomen: soft, nontender, normal bowel sounds   Lab Results: @LABTEST2 @ Micro Results: Recent Results (from the past 240 hour(s))  SARS CORONAVIRUS 2 (TAT 6-24 HRS) Nasopharyngeal Nasopharyngeal Swab     Status: None   Collection Time: 10/30/19  4:02 PM   Specimen: Nasopharyngeal Swab  Result Value Ref Range Status   SARS Coronavirus 2 NEGATIVE NEGATIVE Final    Comment: (NOTE) SARS-CoV-2 target nucleic acids are NOT DETECTED. The SARS-CoV-2 RNA is generally detectable in upper and lower respiratory specimens during the acute phase of infection. Negative results do not preclude SARS-CoV-2 infection, do not rule out co-infections with other pathogens, and should not be used as the sole basis for treatment or other patient management decisions. Negative results must be  combined with clinical observations, patient history, and epidemiological information. The expected result is Negative. Fact Sheet for Patients: SugarRoll.be Fact Sheet for Healthcare Providers: https://www.woods-mathews.com/ This test is not yet approved or cleared by the [place] FDA and  has been authorized for detection and/or diagnosis of SARS-CoV-2 by FDA under an Emergency Use Authorization (EUA). This EUA will remain  in effect (meaning this test can be used) for the duration of the COVID-19 declaration under Section 56 4(b)(1) of the Act, 21 U.S.C. section 360bbb-3(b)(1), unless the authorization is terminated or revoked sooner. Performed at [facility], [place] [address] [place], [place] [address]   MRSA PCR Screening     Status: Abnormal   Collection Time: 10/31/19  6:53 PM   Specimen: Nasal Mucosa; Nasopharyngeal  Result Value Ref Range Status   MRSA by PCR POSITIVE (A) NEGATIVE Final    Comment:        The GeneXpert MRSA Assay (FDA approved for NASAL specimens only), is one component of a comprehensive MRSA colonization surveillance program. It is not intended to diagnose MRSA infection nor to guide or monitor treatment for MRSA infections. RESULT CALLED TO, READ BACK BY AND VERIFIED WITH: DAWN SONGSTER 10/31/2019 2030 KMP Performed at [facility] [address], [place], [place] [address]    Studies/Results: No results found. Medications: I have reviewed the patient's current medications. Scheduled Meds: . benztropine  0.5 mg Oral BID  . Chlorhexidine Gluconate Cloth  6 each Topical Q0600  . docusate sodium  100 mg Oral BID  . ferrous sulfate  325 mg Oral BID WC  . fluPHENAZine  15 mg Oral QHS  . fluPHENAZine  15 mg Oral Daily  . hydrocortisone  10 mg Oral BID  . hydroxychloroquine  200 mg Oral BID  . levothyroxine  200 mcg Oral QAC breakfast  . metoprolol tartrate  50 mg Oral BID  .  multivitamin with minerals  1 tablet Oral Daily  . mupirocin ointment  1 application Nasal BID  . pantoprazole  40 mg Oral Daily  . polyethylene glycol  17 g Oral Daily  . polyethylene glycol-electrolytes  4,000 mL Oral Once  . senna  1 tablet Oral BID  . tamsulosin  0.4 mg Oral Daily  . vitamin C  500 mg Oral BID   Continuous Infusions: PRN Meds:.acetaminophen **OR** acetaminophen, albuterol, guaiFENesin, LORazepam, ondansetron **OR** ondansetron (ZOFRAN) IV, polyethylene glycol, traMADol   Assessment: Active Problems:   Anemia   Schizo affective schizophrenia ([place])    Plan: This patient will have a colonoscopy done tomorrow for his anemia.  The patient's hemoglobin has dropped slightly since yesterday.  I have discussed the risk benefits and alternatives to the patient's legal guardian and she agrees with proceeding with the procedure.  The patient should get his prep today and that has been ordered for the colonoscopy for tomorrow.   LOS: 2 days   Jerry [name] 11/01/2019, 11:00 AM Pager [phone] 7am-5pm  Check AMION for 5pm -7am coverage and on weekends

## 2019-11-01 NOTE — Progress Notes (Signed)
[place] at [place] NAME: Jerry [name]    MR#:  [id number]  DATE OF BIRTH:  June 19, 1956  SUBJECTIVE:  awake and alert. Has baseline confusion due to his chronic schizophrenia. Tolerating clear liquid  No active bleeding noted  REVIEW OF SYSTEMS:   Review of Systems  Unable to perform ROS: Psychiatric disorder   Tolerating Diet: Tolerating PT: does not walk  DRUG ALLERGIES:   Allergies  Allergen Reactions  . Methadone   . Penicillins     Tolerates cephalosporins   . Propoxyphene   . Sulfa Antibiotics   . Valproic Acid And Related     Thrombocytopenia    VITALS:  Blood pressure (!) 134/93, pulse 78, temperature 97.9 F (36.6 C), temperature source Oral, resp. rate 20, height 6\' 2"  (1.88 m), weight (!) 153.3 kg, SpO2 100 %.  PHYSICAL EXAMINATION:   Physical Exam  GENERAL:  63 y.o.-year-old patient lying in the bed with no acute distress. Morbidly obese chronically ill EYES: Pupils equal, round, reactive to light and accommodation. No scleral icterus. Extraocular muscles intact.  HEENT: Head atraumatic, normocephalic. Oropharynx and nasopharynx clear.  NECK:  Supple, no jugular venous distention. No thyroid enlargement, no tenderness.  LUNGS: Normal breath sounds bilaterally, no wheezing, rales, rhonchi. No use of accessory muscles of respiration.  CARDIOVASCULAR: S1, S2 normal. No murmurs, rubs, or gallops.  ABDOMEN: Soft, nontender, nondistended. Bowel sounds present. No organomegaly or mass.  EXTREMITIES: bilateral chronic edema b/l.   Chronic contractures lower extremity NEUROLOGIC: unable to assess however moves all extremities spontaneously PSYCHIATRIC:  patient is alert but confused at baseline SKIN: No obvious rash, lesion, or ulcer. Per RN  LABORATORY PANEL:  CBC Recent Labs  Lab 10/31/19 0516 11/01/19 0851  WBC 5.5  --   HGB 7.8* 7.5*  HCT 26.6* 25.3*  PLT 249  --     Chemistries  Recent Labs  Lab  10/30/19 1244  NA 139  K 3.8  CL 103  CO2 29  GLUCOSE 96  BUN 12  CREATININE 0.49*  CALCIUM 8.6*   Cardiac Enzymes No results for input(s): TROPONINI in the last 168 hours. RADIOLOGY:  No results found. ASSESSMENT AND PLAN:  Eddiel Than  is a 63 y.o. 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  1.Acute on chronic anemia with heme positive stools and history of severe rheumatoid arthritis -there is a possibility patient could be having slow G.I. bleed -med-list does not show any NSAIDs or blood thinner -patient s/pone unit of blood transfusion -admission hemoglobin 6.8-- one unit blood transfusion--8.0--7.8 -IV Protonix BID--change to po PPI -G.I. consultation with Dr. Bonna Gains appreciated -EGD-- within normal limits -colonoscopy-- plan for Sunday -continue iron supplementation -patient is followed closely for his anemia at the cancer center by Dr. Rogue Bussing  2. Chronic schizophrenia -continue his psych meds  3. COPD on chronic home oxygen -continue inhalers as needed  4. Chronic diastolic congestive heart failure EF 60% by echo in 2018. Currently does not appear to be in heart failure  5. Chronic thrombocytopenia -continue to monitor  6. BPH on Flomax  At baseline patient is bedbound. Social worker for discharge planning back to his long-term facility   Family Communication: spoke with patient's  niece [name] on the phone Consults: G.I. Code Status: DNR-- prior to admission. Confirmed with niece [name] who is HCPOA DVT prophylaxis:SCD due to TCP  TOTAL TIME TAKING CARE OF THIS PATIENT: *20* minutes.  >50% time spent on counselling and coordination of care  POSSIBLE D/C IN **1  to 3* DAYS, DEPENDING ON CLINICAL CONDITION.  Note: This dictation was prepared with Dragon dictation along with smaller phrase technology. Any transcriptional errors that result from this process are unintentional.  Fritzi Mandes M.D on 11/01/2019 at 2:34 PM  Between 7am to 6pm - Pager - [phone]  After 6pm go to [email or web address]  [facility]   CC: Primary care physician; [name], MDPatient ID: [name], male   DOB: 08-01-1956, 63 y.o.   MRN: [id number]

## 2019-11-02 ENCOUNTER — Encounter
Admission: EM | Disposition: A | Discharge status: Skilled Nursing Facility | Payer: Self-pay | Source: Skilled Nursing Facility | Attending: Internal Medicine

## 2019-11-02 ENCOUNTER — Inpatient Hospital Stay: Payer: Medicaid Other | Admitting: Anesthesiology

## 2019-11-02 HISTORY — PX: COLONOSCOPY WITH PROPOFOL: SHX5780

## 2019-11-02 LAB — CBC
HCT: 25.1 % — ABNORMAL LOW (ref 39.0–52.0)
Hemoglobin: 7.6 g/dL — ABNORMAL LOW (ref 13.0–17.0)
MCH: 23.5 pg — ABNORMAL LOW (ref 26.0–34.0)
MCHC: 30.3 g/dL (ref 30.0–36.0)
MCV: 77.5 fL — ABNORMAL LOW (ref 80.0–100.0)
Platelets: 255 10*3/uL (ref 150–400)
RBC: 3.24 MIL/uL — ABNORMAL LOW (ref 4.22–5.81)
RDW: 17.3 % — ABNORMAL HIGH (ref 11.5–15.5)
WBC: 4.3 10*3/uL (ref 4.0–10.5)
nRBC: 0 % (ref 0.0–0.2)

## 2019-11-02 SURGERY — COLONOSCOPY WITH PROPOFOL
Anesthesia: General

## 2019-11-02 MED ORDER — FENTANYL CITRATE (PF) 100 MCG/2ML IJ SOLN
INTRAMUSCULAR | Status: AC
  Filled 2019-11-02: qty 2

## 2019-11-02 MED ORDER — PROPOFOL 500 MG/50ML IV EMUL
INTRAVENOUS | Status: DC | PRN
  Administered 2019-11-02: 150 ug/kg/min via INTRAVENOUS

## 2019-11-02 MED ORDER — ONDANSETRON HCL 4 MG/2ML IJ SOLN: 4.0000 mg | Freq: Once | INTRAMUSCULAR | Status: DC | PRN

## 2019-11-02 MED ORDER — PROPOFOL 10 MG/ML IV BOLUS
INTRAVENOUS | Status: DC | PRN
  Administered 2019-11-02: 50 mg via INTRAVENOUS

## 2019-11-02 MED ORDER — PROPOFOL 10 MG/ML IV BOLUS
INTRAVENOUS | Status: AC
  Filled 2019-11-02: qty 40

## 2019-11-02 MED ORDER — SODIUM CHLORIDE 0.9 % IV SOLN
INTRAVENOUS | Status: DC | PRN
  Administered 2019-11-02: 10:00:00 via INTRAVENOUS

## 2019-11-02 NOTE — Progress Notes (Signed)
[place] at [place] NAME: Jerry [name]    MR#:  [id number]  DATE OF BIRTH:  December 04, 1955  SUBJECTIVE:  awake and alert. Has baseline confusion due to his chronic schizophrenia. Did drink prep for colonoscopy. No active bleeding noted  REVIEW OF SYSTEMS:   Review of Systems  Unable to perform ROS: Psychiatric disorder   Tolerating Diet:NPO Tolerating PT: does not walk  DRUG ALLERGIES:   Allergies  Allergen Reactions  . Methadone   . Penicillins     Tolerates cephalosporins   . Propoxyphene   . Sulfa Antibiotics   . Valproic Acid And Related     Thrombocytopenia    VITALS:  Blood pressure (!) 144/91, pulse 75, temperature 98.9 F (37.2 C), temperature source Oral, resp. rate (!) 24, height 6\' 2"  (1.88 m), weight (!) 157.1 kg, SpO2 100 %.  PHYSICAL EXAMINATION:   Physical Exam  GENERAL:  63 y.o.-year-old patient lying in the bed with no acute distress. Morbidly obese, pallor + EYES: Pupils equal, round, reactive to light and accommodation. No scleral icterus.  HEENT: Head atraumatic, normocephalic. Oropharynx and nasopharynx clear.   LUNGS: Normal breath sounds bilaterally, no wheezing, rales, rhonchi. No use of accessory muscles of respiration.  CARDIOVASCULAR: S1, S2 normal. No murmurs, rubs, or gallops.  ABDOMEN: Soft, nontender, nondistended. Abdominal obesity++ EXTREMITIES: bilateral chronic edema b/l.   Chronic DJD upper and lower extremity NEUROLOGIC: unable to assess however moves all extremities spontaneously PSYCHIATRIC:  patient is alert but confused at baseline SKIN: No obvious rash, lesion, or ulcer. Per RN  LABORATORY PANEL:  CBC Recent Labs  Lab 10/31/19 0516 11/01/19 0851  WBC 5.5  --   HGB 7.8* 7.5*  HCT 26.6* 25.3*  PLT 249  --     Chemistries  Recent Labs  Lab 10/30/19 1244  NA 139  K 3.8  CL 103  CO2 29  GLUCOSE 96  BUN 12  CREATININE 0.49*  CALCIUM 8.6*   Cardiac Enzymes No results  for input(s): TROPONINI in the last 168 hours. RADIOLOGY:  No results found. ASSESSMENT AND PLAN:  Jerry [name]  is a 63 y.o. 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  1.Acute on chronic anemia with heme positive stools and history of severe rheumatoid arthritis -there is a possibility patient could be having slow G.I. bleed -med-list does not show any NSAIDs or blood thinner -patient s/pone unit of blood transfusion -admission hemoglobin 6.8-- one unit blood transfusion--8.0--7.8 -IV Protonix BID--change to po PPI -G.I. consultation with Dr. Darene Lamer appreciated -EGD-- within normal limits -colonoscopy-- plan for today -continue iron supplementation -patient is followed closely for his anemia at the cancer center by Dr. Rogue Bussing  2. Chronic schizophrenia -continue his psych meds  3. COPD on chronic home oxygen -continue inhalers as needed  4. Chronic diastolic congestive heart failure EF 60% by echo in 2018. Currently does not appear to be in heart failure  5. Chronic thrombocytopenia -continue to monitor  6. BPH on Flomax  At baseline patient is bedbound. Social worker for discharge planning back to his long-term facility --anticipate tomorrow   Family Communication: patient's niece [name] on the phone  Consults: G.I. Code Status: DNR-- prior to admission. Confirmed with  niece [name] who is HCPOA DVT prophylaxis:SCD due to TCP  TOTAL TIME TAKING CARE OF THIS PATIENT: *20* minutes.  >50% time spent on counselling and coordination of care  POSSIBLE D/C IN **1 to 2* DAYS, DEPENDING ON CLINICAL CONDITION.  Note: This dictation was prepared with Dragon dictation along with smaller  phrase technology. Any transcriptional errors that result from this process are unintentional.  Fritzi Mandes M.D on 11/02/2019 at 9:00 AM  Between 7am to 6pm - Pager - [phone]  After 6pm go to [email or web address]  [facility]   CC: Primary care physician; [name], MDPatient ID: Jerry [name], male   DOB: 1956/09/06, 63 y.o.   MRN: [id number]

## 2019-11-02 NOTE — Anesthesia Postprocedure Evaluation (Signed)
Anesthesia Post Note  Patient: Jerry [name]  Procedure(s) Performed: COLONOSCOPY WITH PROPOFOL (N/A )  Patient location during evaluation: PACU Anesthesia Type: General Level of consciousness: awake and alert Pain management: pain level controlled Vital Signs Assessment: post-procedure vital signs reviewed and stable Respiratory status: spontaneous breathing and respiratory function stable Cardiovascular status: stable Anesthetic complications: no     Last Vitals:  Vitals:   11/02/19 1108 11/02/19 1116  BP: 139/79 (!) 141/71  Pulse: 85 79  Resp: 17 14  Temp:  (!) 36.4 C  SpO2: 100% 100%    Last Pain:  Vitals:   11/02/19 1116  TempSrc:   PainSc: 0-No pain                 KEPHART,WILLIAM K

## 2019-11-02 NOTE — Anesthesia Preprocedure Evaluation (Signed)
Anesthesia Evaluation  Patient identified by MRN, date of birth, ID band Patient awake    Reviewed: Allergy & Precautions, H&P , NPO status , Patient's Chart, lab work & pertinent test results  History of Anesthesia Complications Negative for: history of anesthetic complications  Airway Mallampati: III  TM Distance: >3 FB Neck ROM: limited    Dental  (+) Chipped, Poor Dentition, Loose   Pulmonary pneumonia, COPD,           Cardiovascular Exercise Tolerance: Good hypertension, (-) angina+CHF  (-) Past MI      Neuro/Psych PSYCHIATRIC DISORDERS Schizophrenia negative neurological ROS     GI/Hepatic negative GI ROS, Neg liver ROS, neg GERD  ,  Endo/Other  negative endocrine ROS  Renal/GU negative Renal ROS  negative genitourinary   Musculoskeletal  (+) Arthritis ,   Abdominal   Peds  Hematology negative hematology ROS (+) anemia ,   Anesthesia Other Findings    Reproductive/Obstetrics negative OB ROS                             Anesthesia Physical  Anesthesia Plan  ASA: III  Anesthesia Plan: General   Post-op Pain Management:    Induction: Intravenous  PONV Risk Score and Plan: Propofol infusion and TIVA  Airway Management Planned: Natural Airway and Nasal Cannula  Additional Equipment:   Intra-op Plan:   Post-operative Plan:   Informed Consent: I have reviewed the patients History and Physical, chart, labs and discussed the procedure including the risks, benefits and alternatives for the proposed anesthesia with the patient or authorized representative who has indicated his/her understanding and acceptance.   Patient has DNR.  Discussed DNR with patient, Discussed DNR with power of attorney and Suspend DNR.     Plan Discussed with:   Anesthesia Plan Comments:         Anesthesia Quick Evaluation

## 2019-11-02 NOTE — Op Note (Signed)
[facility] Gastroenterology Patient Name: Jerry [name] Procedure Date: 11/02/2019 10:13 AM MRN: [id number] Account #: [id number] Date of Birth: [birth year] Admit Type: Outpatient Age: 63 Room: [facility] ENDO ROOM 4 Gender: Male Note Status: Finalized Procedure:             Colonoscopy Indications:           Iron deficiency anemia Providers:             Lucilla Lame MD, MD Medicines:             Propofol per Anesthesia Complications:         No immediate complications. Procedure:             Pre-Anesthesia Assessment:                        - Prior to the procedure, a History and Physical was                         performed, and patient medications and allergies were                         reviewed. The patient's tolerance of previous                         anesthesia was also reviewed. The risks and benefits                         of the procedure and the sedation options and risks                         were discussed with the patient. All questions were                         answered, and informed consent was obtained. Prior                         Anticoagulants: The patient has taken no previous                         anticoagulant or antiplatelet agents. ASA Grade                         Assessment: III - A patient with severe systemic                         disease. After reviewing the risks and benefits, the                         patient was deemed in satisfactory condition to                         undergo the procedure.                        After obtaining informed consent, the colonoscope was                         passed under direct vision. Throughout the procedure,  the patient's blood pressure, pulse, and oxygen                         saturations were monitored continuously. The                         Colonoscope was introduced through the anus and                         advanced to the the sigmoid colon. The  colonoscopy was                         performed without difficulty. The patient tolerated                         the procedure well. The quality of the bowel                         preparation was poor. Findings:      The perianal and digital rectal examinations were normal.      A large amount of stool was found in the rectum, precluding       visualization. Impression:            - Preparation of the colon was poor.                        - Stool in the rectum.                        - No specimens collected. Recommendation:        - Return patient to hospital ward for ongoing care.                        - Clear liquid diet.                        - Continue present medications.                        - Repeat colonoscopy tomorrow because the bowel                         preparation was poor. Procedure Code(s):     --- Professional ---                        [id number], 96, Colonoscopy, flexible; diagnostic,                         including collection of specimen(s) by brushing or                         washing, when performed (separate procedure) Diagnosis Code(s):     --- Professional ---                        D50.9, Iron deficiency anemia, unspecified CPT copyright 2019 American Medical Association. All rights reserved. The codes documented in this report are preliminary and upon coder review may  be revised to meet current compliance requirements. Lucilla Lame MD, MD 11/02/2019 11:04:32 AM  This report has been signed electronically. Number of Addenda: 0 Note Initiated On: 11/02/2019 10:13 AM Total Procedure Duration: 0 hours 2 minutes 23 seconds  Estimated Blood Loss:  Estimated blood loss: none.      [facility]

## 2019-11-02 NOTE — Anesthesia Post-op Follow-up Note (Signed)
Anesthesia QCDR form completed.        

## 2019-11-02 NOTE — Transfer of Care (Signed)
Immediate Anesthesia Transfer of Care Note  Patient: Jerry [name]  Procedure(s) Performed: COLONOSCOPY WITH PROPOFOL (N/A )  Patient Location: PACU  Anesthesia Type:General  Level of Consciousness: awake  Airway & Oxygen Therapy: Patient Spontanous Breathing and Patient connected to face mask oxygen  Post-op Assessment: Report given to RN and Post -op Vital signs reviewed and stable  Post vital signs: Reviewed and stable  Last Vitals:  Vitals Value Taken Time  BP    Temp    Pulse    Resp    SpO2      Last Pain:  Vitals:   11/02/19 0939  TempSrc: Tympanic  PainSc:       Patients Stated Pain Goal: 0 ([id number])  Complications: No apparent anesthesia complications

## 2019-11-02 NOTE — Progress Notes (Signed)
The patient was brought down for colonoscopy today.  The IV was not working and a new IV had to be placed.  The patient was then placed on his left side and the scope was inserted showing a large amount of solid stool throughout the entire colon.  I will order a repeat prep and have his colon attempted again tomorrow.

## 2019-11-03 ENCOUNTER — Encounter: Payer: Self-pay | Admitting: Anesthesiology

## 2019-11-03 ENCOUNTER — Encounter
Admission: EM | Disposition: A | Discharge status: Skilled Nursing Facility | Payer: Self-pay | Source: Skilled Nursing Facility | Attending: Internal Medicine

## 2019-11-03 ENCOUNTER — Encounter: Payer: Self-pay | Admitting: Gastroenterology

## 2019-11-03 SURGERY — COLONOSCOPY WITH PROPOFOL
Anesthesia: General

## 2019-11-03 MED ORDER — PEG 3350-KCL-NA BICARB-NACL 420 G PO SOLR
4000.0000 mL | Freq: Once | ORAL | Status: AC
  Administered 2019-11-03: 4000 mL via ORAL
  Filled 2019-11-03: qty 4000

## 2019-11-03 MED ORDER — SODIUM CHLORIDE 0.9 % IV SOLN
INTRAVENOUS | Status: DC
  Administered 2019-11-04 – 2019-11-09 (×2): via INTRAVENOUS
  Administered 2019-11-12: 10 mL via INTRAVENOUS

## 2019-11-03 NOTE — Anesthesia Preprocedure Evaluation (Deleted)
Anesthesia Evaluation  Patient identified by MRN, date of birth, ID band Patient awake    Reviewed: Allergy & Precautions, H&P , NPO status , Patient's Chart, lab work & pertinent test results  Airway        Dental   Pulmonary COPD,           Cardiovascular hypertension, +CHF    Most recent echo: mod LVH, hyperdynamic LV systolic function, unable to assess diastolic function   Neuro/Psych PSYCHIATRIC DISORDERS Schizophrenia negative neurological ROS     GI/Hepatic negative GI ROS, Neg liver ROS,   Endo/Other  Morbid obesity  Renal/GU negative Renal ROS  negative genitourinary   Musculoskeletal   Abdominal   Peds  Hematology  (+) Blood dyscrasia, anemia , Chronic anemia Hgb 7.6   Anesthesia Other Findings Past Medical History: No date: BPH (benign prostatic hyperplasia) No date: CHF (congestive heart failure) (HCC) No date: Chronic pain No date: COPD (chronic obstructive pulmonary disease) (HCC) No date: Hypertension No date: Morbid obesity ([place]) No date: Pressure ulcer No date: Rheumatoid arthritis ([place]) No date: Schizophrenia ([place]) No date: Thyroid disease  History reviewed. No pertinent surgical history.  BMI    Body Mass Index: 44.48 kg/m      Reproductive/Obstetrics negative OB ROS                             Anesthesia Physical Anesthesia Plan  ASA: III  Anesthesia Plan: General   Post-op Pain Management:    Induction:   PONV Risk Score and Plan: Propofol infusion and TIVA  Airway Management Planned: Natural Airway and Simple Face Mask  Additional Equipment:   Intra-op Plan:   Post-operative Plan:   Informed Consent: I have reviewed the patients History and Physical, chart, labs and discussed the procedure including the risks, benefits and alternatives for the proposed anesthesia with the patient or authorized representative who has indicated his/her  understanding and acceptance.     Dental Advisory Given  Plan Discussed with: Anesthesiologist  Anesthesia Plan Comments:         Anesthesia Quick Evaluation

## 2019-11-03 NOTE — Progress Notes (Addendum)
At this time, pt has drank 1/2 of the bowel prep. Pt need to finish the whole remaining of  bowel prep.

## 2019-11-03 NOTE — TOC Initial Note (Signed)
Transition of Care [facility]) - Initial/Assessment Note    Patient Details  Name: Jerry [name] MRN: [id number] Date of Birth: 1956-03-18  [facility] CM/SW Contact:    Shade Flood, LCSW Phone Number: 11/03/2019, 1:29 PM  Clinical Narrative:                  Pt admitted from [facility] where he is in long term care. Plan is for return to [facility] at dc. Pt has a legal guardian.   Updated Tina at [facility] that MD anticipating dc on 12/8. Per Otila Kluver, pt will need a new negative covid test result prior to dc. Updated MD who stated she would order it today.  TOC will follow up tomorrow to further assist with dc needs.  Expected Discharge Plan: Long Term Nursing Home Barriers to Discharge: Continued Medical Work up   Patient Goals and CMS Choice        Expected Discharge Plan and Services Expected Discharge Plan: [place] In-house Referral: Clinical Social Work     Living arrangements for the past 2 months: [place]                                      Prior Living Arrangements/Services Living arrangements for the past 2 months: [place] Lives with:: Facility Resident Patient language and need for interpreter reviewed:: Yes Do you feel safe going back to the place where you live?: Yes      Need for Family Participation in Patient Care: No (Comment) Care giver support system in place?: Yes (comment)   Criminal Activity/Legal Involvement Pertinent to Current Situation/Hospitalization: No - Comment as needed  Activities of Daily Living Home Assistive Devices/Equipment: Hospital bed ADL Screening (condition at time of admission) Patient's cognitive ability adequate to safely complete daily activities?: No Is the patient deaf or have difficulty hearing?: No Does the patient have difficulty seeing, even when wearing glasses/contacts?: No Does the patient have difficulty concentrating, remembering, or making decisions?:  Yes Patient able to express need for assistance with ADLs?: Yes Does the patient have difficulty dressing or bathing?: Yes Independently performs ADLs?: No Communication: Independent Dressing (OT): Dependent Is this a change from baseline?: Pre-admission baseline Grooming: Dependent Is this a change from baseline?: Pre-admission baseline Feeding: Dependent Is this a change from baseline?: Pre-admission baseline Bathing: Dependent Is this a change from baseline?: Pre-admission baseline Toileting: Dependent Is this a change from baseline?: Pre-admission baseline In/Out Bed: Dependent Is this a change from baseline?: Pre-admission baseline Walks in Home: Dependent Is this a change from baseline?: Pre-admission baseline Does the patient have difficulty walking or climbing stairs?: Yes Weakness of Legs: Both Weakness of Arms/Hands: Both  Permission Sought/Granted                  Emotional Assessment       Orientation: : Oriented to Self, Oriented to Place, Oriented to  Time Alcohol / Substance Use: Not Applicable Psych Involvement: No (comment)  Admission diagnosis:  Undifferentiated schizophrenia ([place]) [F20.3] Gastrointestinal hemorrhage, unspecified gastrointestinal hemorrhage type [K92.2] Anemia, unspecified type [D64.9] Patient Active Problem List   Diagnosis Date Noted  . Schizo affective schizophrenia ([place])   . 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 Anemia 10/30/2019  . Gastrointestinal hemorrhage   . Chronic obstructive pulmonary disease ([place])   . COVID-19  virus infection 09/11/2019  . Normocytic anemia 08/25/2019  . Community acquired pneumonia   . DNR (do not resuscitate) discussion   . SOB (shortness of breath) 07/16/2019  . Obesity, Class III, BMI 40-49.9 (morbid obesity) ([place])   . Schizophrenia ([place])   . Palliative care by specialist   . Advance care planning   . Goals  of care, counseling/discussion   . CHF (congestive heart failure) ([place])   . Altered mental status   . Septic shock ([place]) 08/02/2017  . Pneumonia 06/21/2017  . Thrombocytopenia ([place]) 06/21/2017  . Leukopenia 06/21/2017  . Pressure injury of skin 06/21/2017   PCP:  [name], MD Pharmacy:  No Pharmacies Listed    Social Determinants of Health (SDOH) Interventions    Readmission Risk Interventions Readmission Risk Prevention Plan 11/03/2019  Transportation Screening Complete  HRI or [place] Not Complete  HRI or Home Care Consult comments Pt lives at a [place] for [place] Planning/Counseling [place] Not Applicable  Medication Review Press photographer) Complete  Some recent data might be hidden

## 2019-11-03 NOTE — Progress Notes (Signed)
Pt. needs encouragement to drink the bowel prep.

## 2019-11-03 NOTE — Progress Notes (Signed)
[place] at [place] NAME: Jerry [name]    MR#:  [id number]  DATE OF BIRTH:  08-03-1956  SUBJECTIVE:  awake and alert. Has baseline confusion due to his chronic schizophrenia. . No active bleeding noted patient's colonoscopy was not completed secondary to poor prep. Apparently he did not get pepped last pm And hence colonoscopy is postponed till tomorrow REVIEW OF SYSTEMS:   Review of Systems  Unable to perform ROS: Psychiatric disorder   Tolerating Diet:NPO Tolerating PT: does not walk  DRUG ALLERGIES:   Allergies  Allergen Reactions  . Methadone   . Penicillins     Tolerates cephalosporins   . Propoxyphene   . Sulfa Antibiotics   . Valproic Acid And Related     Thrombocytopenia    VITALS:  Blood pressure (!) 143/108, pulse 83, temperature 98.8 F (37.1 C), temperature source Oral, resp. rate 20, height 6\' 2"  (1.88 m), weight (!) 157.1 kg, SpO2 100 %.  PHYSICAL EXAMINATION:   Physical Exam  GENERAL:  63 y.o.-year-old patient lying in the bed with no acute distress. Morbidly obese, pallor + EYES: Pupils equal, round, reactive to light and accommodation. No scleral icterus.  HEENT: Head atraumatic, normocephalic. Oropharynx and nasopharynx clear.   LUNGS: Normal breath sounds bilaterally, no wheezing, rales, rhonchi. No use of accessory muscles of respiration.  CARDIOVASCULAR: S1, S2 normal. No murmurs, rubs, or gallops.  ABDOMEN: Soft, nontender, nondistended. Abdominal obesity++ EXTREMITIES: bilateral chronic edema b/l.   Chronic DJD upper and lower extremity NEUROLOGIC: unable to assess however moves all extremities spontaneously PSYCHIATRIC:  patient is alert but confused at baseline SKIN: No obvious rash, lesion, or ulcer. Per RN  LABORATORY PANEL:  CBC Recent Labs  Lab 11/02/19 0846  WBC 4.3  HGB 7.6*  HCT 25.1*  PLT 255    Chemistries  Recent Labs  Lab 10/30/19 1244  NA 139  K 3.8  CL 103  CO2 29   GLUCOSE 96  BUN 12  CREATININE 0.49*  CALCIUM 8.6*   Cardiac Enzymes No results for input(s): TROPONINI in the last 168 hours. RADIOLOGY:  No results found. ASSESSMENT AND PLAN:  Jerry [name]  is a 63 y.o. 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  1.Acute on chronic anemia with heme positive stools and history of severe rheumatoid arthritis -there is a possibility patient could be having slow G.I. bleed -med-list does not show any NSAIDs or blood thinner -patient s/pone unit of blood transfusion -admission hemoglobin 6.8-- one unit blood transfusion--8.0--7.8-7.6 -IV Protonix BID--change to po PPI -G.I. consultation with Dr. Darene Lamer appreciated -EGD-- within normal limits -colonoscopy-- on 12/6 unable to complete due to poor prep. RN did not release the prep orders last night hence patient did not get his re-prep and hence colonoscopy is postponed till 12/8 -continue iron supplementation -patient is followed closely for his anemia at the cancer center by Dr. Rogue Bussing  2. Chronic schizophrenia -continue his psych meds  3. COPD on chronic home oxygen -continue inhalers as needed  4. Chronic diastolic congestive heart failure EF 60% by echo in 2018. Currently does not appear to be in heart failure  5. Chronic thrombocytopenia -continue to monitor  6. BPH on Flomax  At baseline  patient is bedbound. Social worker for discharge planning back to his long-term facility --anticipate tomorrow if colonoscopy remains unremarkable  Repeat COVID ordered   Family Communication: patient's niece [name] on the phone  Consults: G.I. Code Status: DNR-- prior to admission. Confirmed with niece  [name] who is HCPOA DVT prophylaxis:SCD due to TCP  TOTAL TIME TAKING CARE OF THIS PATIENT: *20* minutes.  >50% time spent on counselling and coordination of care  POSSIBLE D/C IN **1 to 2* DAYS, DEPENDING ON CLINICAL CONDITION.  Note: This dictation was prepared with Dragon dictation along with smaller phrase technology. Any transcriptional errors that result from this process are unintentional.  Fritzi Mandes M.D on 11/03/2019 at 1:52 PM  Between 7am to 6pm - Pager - [phone]  After 6pm go to [email or web address]  [facility]   CC: Primary care physician; [name], MDPatient ID: Jerry [name], male   DOB: [birth year], 63 y.o.   MRN: [id number]

## 2019-11-04 ENCOUNTER — Inpatient Hospital Stay: Payer: Medicaid Other | Admitting: Registered Nurse

## 2019-11-04 ENCOUNTER — Encounter
Admission: EM | Disposition: A | Discharge status: Skilled Nursing Facility | Payer: Self-pay | Source: Skilled Nursing Facility | Attending: Internal Medicine

## 2019-11-04 DIAGNOSIS — I5089 Other heart failure: Secondary | ICD-10-CM

## 2019-11-04 DIAGNOSIS — G8929 Other chronic pain: Secondary | ICD-10-CM

## 2019-11-04 DIAGNOSIS — D649 Anemia, unspecified: Secondary | ICD-10-CM | POA: Diagnosis not present

## 2019-11-04 DIAGNOSIS — E079 Disorder of thyroid, unspecified: Secondary | ICD-10-CM

## 2019-11-04 DIAGNOSIS — F203 Undifferentiated schizophrenia: Secondary | ICD-10-CM

## 2019-11-04 DIAGNOSIS — J449 Chronic obstructive pulmonary disease, unspecified: Secondary | ICD-10-CM

## 2019-11-04 DIAGNOSIS — Z79899 Other long term (current) drug therapy: Secondary | ICD-10-CM

## 2019-11-04 DIAGNOSIS — I10 Essential (primary) hypertension: Secondary | ICD-10-CM

## 2019-11-04 DIAGNOSIS — K6389 Other specified diseases of intestine: Secondary | ICD-10-CM | POA: Diagnosis present

## 2019-11-04 HISTORY — PX: COLONOSCOPY WITH PROPOFOL: SHX5780

## 2019-11-04 LAB — SAMPLE TO BLOOD BANK

## 2019-11-04 LAB — SARS CORONAVIRUS 2 (TAT 6-24 HRS): SARS Coronavirus 2: NEGATIVE

## 2019-11-04 SURGERY — COLONOSCOPY WITH PROPOFOL
Anesthesia: General

## 2019-11-04 MED ORDER — PROPOFOL 500 MG/50ML IV EMUL
INTRAVENOUS | Status: DC | PRN
  Administered 2019-11-04: 100 ug/kg/min via INTRAVENOUS

## 2019-11-04 MED ORDER — EPHEDRINE SULFATE 50 MG/ML IJ SOLN
INTRAMUSCULAR | Status: DC | PRN
  Administered 2019-11-04: 10 mg via INTRAVENOUS

## 2019-11-04 MED ORDER — DEXMEDETOMIDINE HCL 200 MCG/2ML IV SOLN
INTRAVENOUS | Status: DC | PRN
  Administered 2019-11-04: 16 ug via INTRAVENOUS

## 2019-11-04 MED ORDER — PROPOFOL 10 MG/ML IV BOLUS
INTRAVENOUS | Status: DC | PRN
  Administered 2019-11-04: 40 mg via INTRAVENOUS

## 2019-11-04 MED ORDER — SPOT INK MARKER SYRINGE KIT
PACK | SUBMUCOSAL | Status: DC | PRN
  Administered 2019-11-04: 12 mL via SUBMUCOSAL

## 2019-11-04 MED ORDER — PROPOFOL 500 MG/50ML IV EMUL
INTRAVENOUS | Status: AC
  Filled 2019-11-04: qty 50

## 2019-11-04 MED ORDER — LIDOCAINE HCL (CARDIAC) PF 100 MG/5ML IV SOSY
PREFILLED_SYRINGE | INTRAVENOUS | Status: DC | PRN
  Administered 2019-11-04: 80 mg via INTRAVENOUS
  Administered 2019-11-04: 20 mg via INTRAVENOUS

## 2019-11-04 MED ORDER — SODIUM CHLORIDE 0.9 % IV SOLN
INTRAVENOUS | Status: DC
  Administered 2019-11-04: 11:00:00 via INTRAVENOUS

## 2019-11-04 NOTE — Anesthesia Preprocedure Evaluation (Signed)
Anesthesia Evaluation  Patient identified by MRN, date of birth, ID band Patient awake and Patient confused    Reviewed: Allergy & Precautions, NPO status , Patient's Chart, lab work & pertinent test results  History of Anesthesia Complications Negative for: history of anesthetic complications  Airway Mallampati: III  TM Distance: >3 FB Neck ROM: Full    Dental  (+) Poor Dentition   Pulmonary neg sleep apnea, COPD,  COPD inhaler,    breath sounds clear to auscultation- rhonchi (-) wheezing      Cardiovascular hypertension, Pt. on medications +CHF (preserved EF)  (-) CAD, (-) Past MI, (-) Cardiac Stents and (-) CABG  Rhythm:Regular Rate:Normal - Systolic murmurs and - Diastolic murmurs    Neuro/Psych neg Seizures PSYCHIATRIC DISORDERS Schizophrenia negative neurological ROS     GI/Hepatic negative GI ROS, Neg liver ROS,   Endo/Other  negative endocrine ROSneg diabetes  Renal/GU negative Renal ROS     Musculoskeletal  (+) Arthritis ,   Abdominal (+) + obese,   Peds  Hematology  (+) anemia ,   Anesthesia Other Findings Past Medical History: No date: BPH (benign prostatic hyperplasia) No date: CHF (congestive heart failure) (HCC) No date: Chronic pain No date: COPD (chronic obstructive pulmonary disease) (HCC) No date: Hypertension No date: Morbid obesity ([place]) No date: Pressure ulcer No date: Rheumatoid arthritis ([place]) No date: Schizophrenia ([place]) No date: Thyroid disease   Reproductive/Obstetrics                             Anesthesia Physical Anesthesia Plan  ASA: III  Anesthesia Plan: General   Post-op Pain Management:    Induction: Intravenous  PONV Risk Score and Plan: 1 and Propofol infusion  Airway Management Planned: Natural Airway  Additional Equipment:   Intra-op Plan:   Post-operative Plan:   Informed Consent: I have reviewed the patients History and  Physical, chart, labs and discussed the procedure including the risks, benefits and alternatives for the proposed anesthesia with the patient or authorized representative who has indicated his/her understanding and acceptance.     Dental advisory given and Consent reviewed with POA  Plan Discussed with: CRNA and Anesthesiologist  Anesthesia Plan Comments: (Consent obtained via phone from legal guardian [name])        Anesthesia Quick Evaluation

## 2019-11-04 NOTE — H&P (Signed)
Jonathon Bellows, MD [address] [place], [place], [place], [id number], [place], [place], [place], [address] Phone: [phone]  Fax: [phone]  Primary Care Physician:  [name], MD   Pre-Procedure History & Physical: HPI:  Jerry [name] is a 63 y.o. male is here for an colonoscopy.   Past Medical History:  Diagnosis Date  . BPH (benign prostatic hyperplasia)   . CHF (congestive heart failure) ([place])   . Chronic pain   . COPD (chronic obstructive pulmonary disease) ([place])   . Hypertension   . Morbid obesity ([place])   . Pressure ulcer   . Rheumatoid arthritis ([place])   . Schizophrenia ([place])   . Thyroid disease     Past Surgical History:  Procedure Laterality Date  . COLONOSCOPY WITH PROPOFOL N/A 11/02/2019   Procedure: COLONOSCOPY WITH PROPOFOL;  Surgeon: Lucilla Lame, MD;  Location: [facility] ENDOSCOPY;  Service: Endoscopy;  Laterality: N/A;  . ESOPHAGOGASTRODUODENOSCOPY (EGD) WITH PROPOFOL N/A 10/31/2019   Procedure: ESOPHAGOGASTRODUODENOSCOPY (EGD) WITH PROPOFOL;  Surgeon: Virgel Manifold, MD;  Location: [facility] ENDOSCOPY;  Service: Endoscopy;  Laterality: N/A;    Prior to Admission medications   Medication Sig Start Date End Date Taking? Authorizing Provider  acetaminophen (TYLENOL) 325 MG tablet Take 650 mg by mouth every 4 (four) hours as needed for mild pain or moderate pain.   Yes [provider]  albuterol (PROVENTIL HFA;VENTOLIN HFA) 108 (90 Base) MCG/ACT inhaler Inhale 1 puff into the lungs every 3 (three) hours as needed for wheezing or shortness of breath.    Yes [provider]  Amino Acids-Protein Hydrolys (FEEDING SUPPLEMENT, PRO-STAT SUGAR FREE 64,) LIQD Take 30 mLs by mouth 3 (three) times daily with meals. Sugar free   Yes [provider]  ammonium lactate (LAC-HYDRIN) 12 % lotion Apply 1 application topically 2 (two) times daily. Apply topically to bilateral feet   Yes [provider]  benztropine  (COGENTIN) 0.5 MG tablet Take 0.5 mg by mouth 2 (two) times daily.   Yes [provider]  docusate sodium (COLACE) 100 MG capsule Take 100 mg by mouth 2 (two) times daily.   Yes [provider]  fluPHENAZine (PROLIXIN) 5 MG tablet Take 3 tablets (15 mg total) by mouth daily. 07/29/19  Yes Mody, Ulice Bold, MD  fluPHENAZine (PROLIXIN) 5 MG tablet Take 3 tablets (15 mg total) by mouth at bedtime. 07/28/19  Yes Mody, Ulice Bold, MD  guaifenesin (ROBITUSSIN) 100 MG/5ML syrup Take 100 mg by mouth every 4 (four) hours as needed for cough.   Yes [provider]  hydrocortisone (CORTEF) 10 MG tablet Take 1 tablet (10 mg total) by mouth 2 (two) times daily. 12/30/17  Yes Fritzi Mandes, MD  hydroxychloroquine (PLAQUENIL) 200 MG tablet Take 200 mg by mouth 2 (two) times daily.   Yes [provider]  levothyroxine (SYNTHROID) 200 MCG tablet Take 200 mcg by mouth daily before breakfast.    Yes [provider]  LORazepam (ATIVAN) 2 MG tablet Take 2 mg by mouth every 6 (six) hours as needed for anxiety.   Yes [provider]  methocarbamol (ROBAXIN) 750 MG tablet Take 750 mg by mouth at bedtime.   Yes [provider]  metoprolol tartrate (LOPRESSOR) 50 MG tablet Take 1 tablet (50 mg total) by mouth 2 (two) times daily. 07/28/19  Yes Bettey Costa, MD  Multiple Vitamins-Minerals (MULTIVITAMIN WITH MINERALS) tablet Take 1 tablet by mouth daily.   Yes [provider]  Polyethylene Glycol  3350 (MIRALAX PO) Take 17 g by mouth daily.   Yes [provider]  tamsulosin (FLOMAX) 0.4 MG CAPS capsule Take 0.4 mg by mouth daily.   Yes [provider]  traMADol (ULTRAM) 50 MG tablet Take 50 mg by mouth every 6 (six) hours as needed for moderate pain or severe pain.   Yes [provider]  vitamin C (ASCORBIC ACID) 500 MG tablet Take 500 mg by mouth 2 (two) times daily.    Yes [provider]    Allergies as of 10/30/2019 - Review Complete  10/30/2019  Allergen Reaction Noted  . Methadone  06/21/2017  . Penicillins  06/21/2017  . Propoxyphene  06/21/2017  . Sulfa antibiotics  06/21/2017  . Valproic acid and related  08/06/2017    Family History  Family history unknown: Yes    Social History   Socioeconomic History  . Marital status: Single    Spouse name: Not on file  . Number of children: Not on file  . Years of education: Not on file  . Highest education level: Not on file  Occupational History  . Not on file  Social Needs  . Financial resource strain: Patient refused  . Food insecurity    Worry: Patient refused    Inability: Patient refused  . Transportation needs    Medical: No    Non-medical: No  Tobacco Use  . Smoking status: Never Smoker  . Smokeless tobacco: Never Used  Substance and Sexual Activity  . Alcohol use: No  . Drug use: No  . Sexual activity: Not Currently    Birth control/protection: None  Lifestyle  . Physical activity    Days per week: Patient refused    Minutes per session: Patient refused  . Stress: Only a little  Relationships  . Social Herbalist on phone: Patient refused    Gets together: Patient refused    Attends religious service: Patient refused    Active member of club or organization: Patient refused    Attends meetings of clubs or organizations: Patient refused    Relationship status: Patient refused  . Intimate partner violence    Fear of current or ex partner: Patient refused    Emotionally abused: Patient refused    Physically abused: Patient refused    Forced sexual activity: Patient refused  Other Topics Concern  . Not on file  Social History Narrative   Resident at [employer].    Review of Systems: See HPI, otherwise negative ROS  Physical Exam: BP (!) 146/86 (BP Location: Right Wrist)   Pulse 70   Temp (!) 97.4 F (36.3 C) (Oral)   Resp 18   Ht 6\' 2"  (1.88 m)   Wt (!) 157.1 kg   SpO2 95%   BMI 44.48 kg/m  General:   Alert,   pleasant and cooperative in NAD Head:  Normocephalic and atraumatic. Neck:  Supple; no masses or thyromegaly. Lungs:  Clear throughout to auscultation, normal respiratory effort.    Heart:  +S1, +S2, Regular rate and rhythm, No edema. Abdomen:  Soft, nontender and nondistended. Normal bowel sounds, without guarding, and without rebound.   Neurologic:  Alert and  oriented x4;  grossly normal neurologically.  Impression/Plan: Jerry [name] is here for an colonoscopy to be performed for iron deficiency anemia.   Risks, benefits, limitations, and alternatives regarding  colonoscopy have been reviewed with the patient.  Questions have been answered.  All parties agreeable.   Jonathon Bellows, MD  11/04/2019, 10:32 AM

## 2019-11-04 NOTE — Anesthesia Postprocedure Evaluation (Signed)
Anesthesia Post Note  Patient: [name]  Procedure(s) Performed: COLONOSCOPY WITH PROPOFOL (N/A )  Patient location during evaluation: Endoscopy Anesthesia Type: General Level of consciousness: awake and alert Pain management: pain level controlled Vital Signs Assessment: post-procedure vital signs reviewed and stable Respiratory status: spontaneous breathing, nonlabored ventilation and respiratory function stable Cardiovascular status: blood pressure returned to baseline and stable Postop Assessment: no signs of nausea or vomiting Anesthetic complications: no     Last Vitals:  Vitals:   11/04/19 1130 11/04/19 1140  BP: 129/75 (!) 140/100  Pulse:    Resp:    Temp:    SpO2:      Last Pain:  Vitals:   11/04/19 1150  TempSrc:   PainSc: 0-No pain                 Eren Puebla

## 2019-11-04 NOTE — Op Note (Signed)
[facility] Gastroenterology Patient Name: Jerry [name] Procedure Date: 11/04/2019 10:06 AM MRN: [id number] Account #: [id number] Date of Birth: Jan 10, 1956 Admit Type: Outpatient Age: 63 Room: [facility] ENDO ROOM 1 Gender: Male Note Status: Finalized Procedure:             Colonoscopy Indications:           Iron deficiency anemia Providers:             Jonathon Bellows MD, MD Medicines:             Monitored Anesthesia Care Complications:         No immediate complications. Procedure:             Pre-Anesthesia Assessment:                        - Prior to the procedure, a History and Physical was                         performed, and patient medications, allergies and                         sensitivities were reviewed. The patient's tolerance                         of previous anesthesia was reviewed.                        - The risks and benefits of the procedure and the                         sedation options and risks were discussed with the                         patient. All questions were answered and informed                         consent was obtained.                        After obtaining informed consent, the colonoscope was                         passed under direct vision. Throughout the procedure,                         the patient's blood pressure, pulse, and oxygen                         saturations were monitored continuously. The                         Colonoscope was introduced through the anus and                         advanced to the the cecum, identified by the                         appendiceal orifice. The colonoscopy was performed  with ease. The patient tolerated the procedure well.                         The quality of the bowel preparation was fair. Findings:      The perianal and digital rectal examinations were normal.      An ulcerated non-obstructing large mass was found in the proximal       ascending  colon. The mass was partially circumferential (involving       one-half of the lumen circumference). The mass measured one cm in       length. No bleeding was present. Biopsies were taken with a cold forceps       for histology. Area was tattooed with an injection of Spot (carbon       black).      The exam was otherwise without abnormality. Impression:            - Preparation of the colon was fair.                        - Rule out malignancy, tumor in the proximal ascending                         colon. Biopsied. Tattooed.                        - The examination was otherwise normal. Recommendation:        - Return patient to hospital ward for ongoing care.                        - Advance diet as tolerated.                        - Continue present medications.                        - Await pathology results. Procedure Code(s):     --- Professional ---                        [phone], Colonoscopy, flexible; with directed submucosal                         injection(s), any substance                        [id number], Colonoscopy, flexible; with biopsy, single or                         multiple Diagnosis Code(s):     --- Professional ---                        D49.0, Neoplasm of unspecified behavior of digestive                         system                        D50.9, Iron deficiency anemia, unspecified CPT copyright 2019 American Medical Association. All rights reserved. The codes documented in this report are preliminary and upon coder review may  be revised to meet current  compliance requirements. Jonathon Bellows, MD Jonathon Bellows MD, MD 11/04/2019 11:19:50 AM This report has been signed electronically. Number of Addenda: 0 Note Initiated On: 11/04/2019 10:06 AM Scope Withdrawal Time: 0 hours 10 minutes 22 seconds  Total Procedure Duration: 0 hours 19 minutes 42 seconds  Estimated Blood Loss:  Estimated blood loss: none.      [facility]

## 2019-11-04 NOTE — Progress Notes (Signed)
[facility] [place] NAME: Jerry [name]    MR#:  [id number]  DATE OF BIRTH:  [birth year]  SUBJECTIVE:  awake and alert. Has baseline confusion due to his chronic schizophrenia. . No active bleeding noted patient is waiting for repeat colonoscopy.  REVIEW OF SYSTEMS:   Review of Systems  Unable to perform ROS: Psychiatric disorder   Tolerating Diet:NPO Tolerating PT: does not walk, bedbound at baseline  DRUG ALLERGIES:   Allergies  Allergen Reactions  . Methadone   . Penicillins     Tolerates cephalosporins   . Propoxyphene   . Sulfa Antibiotics   . Valproic Acid And Related     Thrombocytopenia    VITALS:  Blood pressure 128/75, pulse 79, temperature 98 F (36.7 C), temperature source Oral, resp. rate 14, height 6\' 2"  (1.88 m), weight (!) 157.1 kg, SpO2 100 %.  PHYSICAL EXAMINATION:   Physical Exam  GENERAL:  63 y.o.-year-old patient lying in the bed with no acute distress. Morbidly obese, pallor  EYES: Pupils equal, round, reactive to light and accommodation. No scleral icterus.  HEENT: Head atraumatic, normocephalic. Oropharynx and nasopharynx clear.   LUNGS: Normal breath sounds bilaterally, no wheezing, rales, rhonchi. No use of accessory muscles of respiration.  CARDIOVASCULAR: S1, S2 normal. No murmurs, rubs, or gallops.  ABDOMEN: Soft, nontender, nondistended. Abdominal obesity++ EXTREMITIES: bilateral chronic edema b/l.   Chronic DJD upper and lower extremity NEUROLOGIC: unable to assess however moves all extremities spontaneously PSYCHIATRIC:  patient is alert but confused at baseline SKIN: No obvious rash, lesion, or ulcer. Per RN  LABORATORY PANEL:  CBC Recent Labs  Lab 11/02/19 0846  WBC 4.3  HGB 7.6*  HCT 25.1*  PLT 255    Chemistries  Recent Labs  Lab 10/30/19 1244  NA 139  K 3.8  CL 103  CO2 29  GLUCOSE 96  BUN 12  CREATININE 0.49*  CALCIUM 8.6*   Cardiac Enzymes No results for  input(s): TROPONINI in the last 168 hours. RADIOLOGY:  No results found. ASSESSMENT AND PLAN:  Jerry [name]  is a 63 y.o. 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  1.Acute on chronic anemia with heme positive stools and history of severe rheumatoid arthritis -there is a possibility patient could be having slow G.I. bleed -med-list does not show any NSAIDs or blood thinner -patient s/pone unit of blood transfusion -admission hemoglobin 6.8-- one unit blood transfusion--8.0--7.8-7.6 -IV Protonix BID--change to po PPI -G.I. consultation with Dr. Mickie [name] appreciated -EGD-- within normal limits -colonoscopy--An ulcerated non-obstructing large mass was found in the proximal       ascending colon. The mass was partially circumferential (involving       one-half of the lumen circumference). The mass measured one cm in       length. No bleeding was present -continue iron supplementation -patient is followed closely for his anemia at the cancer center by Dr. Rogue [name] -Dr. Lysle [name] from surgery consult. Will await his opinion regarding further care. Patient's niece has been informed of colonoscopy findings. -Palliative care consultation has been obtained. -Surgery to place a port prior to patient being discharged given poor IV access. This request was made by Dr. Rogue [name]. Patient may need IV iron,  IV blood transfusion and lab draws as outpatient.  2. Chronic schizophrenia -continue his psych meds  3. COPD on chronic home oxygen -continue inhalers as needed  4. Chronic diastolic congestive heart failure EF 60% by echo in 2018. Currently does not appear to be in heart failure  5. Chronic  thrombocytopenia -continue to monitor  6. BPH on Flomax  At baseline patient is bed bound at baseline and is form a long term facility   Family Communication: patient's niece Jerry [name] on the phone  Consults: G.I. Code Status: DNR-- prior to admission. Confirmed with niece Jerry who is HCPOA DVT prophylaxis:SCD due to TCP  TOTAL TIME TAKING CARE OF THIS PATIENT: *25* minutes.  >50% time spent on counselling and coordination of care  POSSIBLE D/C IN *few DAYS, DEPENDING ON CLINICAL CONDITION.  Note: This dictation was prepared with Dragon dictation along with smaller phrase technology. Any transcriptional errors that result from this process are unintentional.  Jerry [name] M.D on 11/04/2019 at 2:33 PM  Between 7am to 6pm - Pager - [phone]  After 6pm go to [email or web address]  [facility]   CC: Primary care physician; Jerry [name], MDPatient ID: Jerry [name], male   DOB: 08/15/1956, 63 y.o.   MRN: [id number]

## 2019-11-04 NOTE — Anesthesia Post-op Follow-up Note (Signed)
Anesthesia QCDR form completed.        

## 2019-11-04 NOTE — Transfer of Care (Signed)
Immediate Anesthesia Transfer of Care Note  Patient: Jerry [name]  Procedure(s) Performed: COLONOSCOPY WITH PROPOFOL (N/A )  Patient Location: Endoscopy Unit  Anesthesia Type:General  Level of Consciousness: awake, drowsy and patient cooperative  Airway & Oxygen Therapy: Patient Spontanous Breathing and Patient connected to nasal cannula oxygen  Post-op Assessment: Report given to RN and Post -op Vital signs reviewed and stable  Post vital signs: Reviewed and stable  Last Vitals:  Vitals Value Taken Time  BP 126/88 11/04/19 1121  Temp 35.7 C 11/04/19 1120  Pulse 72 11/04/19 1124  Resp 21 11/04/19 1124  SpO2 100 % 11/04/19 1124  Vitals shown include unvalidated device data.  Last Pain:  Vitals:   11/04/19 1120  TempSrc: Temporal  PainSc: Asleep      Patients Stated Pain Goal: 0 ([id number])  Complications: No apparent anesthesia complications

## 2019-11-04 NOTE — Consult Note (Signed)
[facility] CONSULT NOTE  Patient Care Team: [name], MD as PCP - General (Family Medicine) Jason Coop, NP as Nurse Practitioner (Hospice and Palliative Medicine)  CHIEF COMPLAINTS/PURPOSE OF CONSULTATION: Severe anemia/colon mass  HISTORY OF PRESENTING ILLNESS:  Jerry [name] 63 y.o.  male multiple medical problems-including poorly controlled schizophrenia-poorly controlled rheumatoid arthritis/nursing home resident-and history of severe anemia is currently admitted hospital for further work-up.  Patient had a colonoscopy that showed ascending colon nonobstructing mass highly concerning for malignancy.  Biopsy pending.  Oncology has been consulted for further evaluation recommendations.  Patient's recent hemoglobin was 7.6.  Received PRBC transfusion.    Review of Systems  Unable to perform ROS: Psychiatric disorder     MEDICAL HISTORY:  Past Medical History:  Diagnosis Date  . BPH (benign prostatic hyperplasia)   . CHF (congestive heart failure) ([place])   . Chronic pain   . COPD (chronic obstructive pulmonary disease) ([place])   . Hypertension   . Morbid obesity ([place])   . Pressure ulcer   . Rheumatoid arthritis ([place])   . Schizophrenia ([place])   . Thyroid disease     SURGICAL HISTORY: Past Surgical History:  Procedure Laterality Date  . COLONOSCOPY WITH PROPOFOL N/A 11/02/2019   Procedure: COLONOSCOPY WITH PROPOFOL;  Surgeon: Lucilla Lame, MD;  Location: [facility] ENDOSCOPY;  Service: Endoscopy;  Laterality: N/A;  . ESOPHAGOGASTRODUODENOSCOPY (EGD) WITH PROPOFOL N/A 10/31/2019   Procedure: ESOPHAGOGASTRODUODENOSCOPY (EGD) WITH PROPOFOL;  Surgeon: Virgel Manifold, MD;  Location: [facility] ENDOSCOPY;  Service: Endoscopy;  Laterality: N/A;    SOCIAL HISTORY: Social History   Socioeconomic History  . Marital status: Single    Spouse name: Not on file  . Number of children: Not on file  . Years of education: Not on file  . Highest education level:  Not on file  Occupational History  . Not on file  Social Needs  . Financial resource strain: Patient refused  . Food insecurity    Worry: Patient refused    Inability: Patient refused  . Transportation needs    Medical: No    Non-medical: No  Tobacco Use  . Smoking status: Never Smoker  . Smokeless tobacco: Never Used  Substance and Sexual Activity  . Alcohol use: No  . Drug use: No  . Sexual activity: Not Currently    Birth control/protection: None  Lifestyle  . Physical activity    Days per week: Patient refused    Minutes per session: Patient refused  . Stress: Only a little  Relationships  . Social Herbalist on phone: Patient refused    Gets together: Patient refused    Attends religious service: Patient refused    Active member of club or organization: Patient refused    Attends meetings of clubs or organizations: Patient refused    Relationship status: Patient refused  . Intimate partner violence    Fear of current or ex partner: Patient refused    Emotionally abused: Patient refused    Physically abused: Patient refused    Forced sexual activity: Patient refused  Other Topics Concern  . Not on file  Social History Narrative   Resident at [facility].    FAMILY HISTORY: Family History  Family history unknown: Yes    ALLERGIES:  is allergic to methadone; penicillins; propoxyphene; sulfa antibiotics; and valproic acid and related.  MEDICATIONS:  Current Facility-Administered Medications  Medication Dose Route Frequency Provider Last Rate Last Dose  . 0.9 %  sodium  chloride infusion   Intravenous Continuous Jonathon Bellows, MD      . acetaminophen (TYLENOL) tablet 650 mg  650 mg Oral Q6H PRN Lucilla Lame, MD   650 mg at 11/03/19 2146   Or  . acetaminophen (TYLENOL) suppository 650 mg  650 mg Rectal Q6H PRN Lucilla Lame, MD      . albuterol (PROVENTIL) (2.5 MG/3ML) 0.083% nebulizer solution 2.5 mg  2.5 mg Inhalation Q3H PRN Lucilla Lame, MD      .  benztropine (COGENTIN) tablet 0.5 mg  0.5 mg Oral BID Lucilla Lame, MD   0.5 mg at 11/04/19 1245  . Chlorhexidine Gluconate Cloth 2 % PADS 6 each  6 each Topical Q0600 Lucilla Lame, MD   6 each at 11/04/19 0550  . docusate sodium (COLACE) capsule 100 mg  100 mg Oral BID Lucilla Lame, MD   100 mg at 11/03/19 2145  . ferrous sulfate tablet 325 mg  325 mg Oral BID WC Lucilla Lame, MD   325 mg at 11/04/19 1604  . fluPHENAZine (PROLIXIN) tablet 15 mg  15 mg Oral QHS Lucilla Lame, MD   15 mg at 11/03/19 2148  . fluPHENAZine (PROLIXIN) tablet 15 mg  15 mg Oral Daily Lucilla Lame, MD   15 mg at 11/04/19 1245  . guaiFENesin (ROBITUSSIN) 100 MG/5ML solution 100 mg  100 mg Oral Q4H PRN Lucilla Lame, MD      . hydrocortisone (CORTEF) tablet 10 mg  10 mg Oral BID Lucilla Lame, MD   10 mg at 11/04/19 1246  . hydroxychloroquine (PLAQUENIL) tablet 200 mg  200 mg Oral BID Lucilla Lame, MD   200 mg at 11/04/19 1242  . levothyroxine (SYNTHROID) tablet 200 mcg  200 mcg Oral QAC breakfast Lucilla Lame, MD   200 mcg at 11/03/19 1012  . LORazepam (ATIVAN) tablet 2 mg  2 mg Oral Q6H PRN Lucilla Lame, MD      . metoprolol tartrate (LOPRESSOR) tablet 50 mg  50 mg Oral BID Lucilla Lame, MD   50 mg at 11/04/19 1244  . multivitamin with minerals tablet 1 tablet  1 tablet Oral Daily Lucilla Lame, MD   1 tablet at 11/04/19 1243  . mupirocin ointment (BACTROBAN) 2 % 1 application  1 application Nasal BID Lucilla Lame, MD   1 application at [id number]  . ondansetron (ZOFRAN) tablet 4 mg  4 mg Oral Q6H PRN Lucilla Lame, MD       Or  . ondansetron (ZOFRAN) injection 4 mg  4 mg Intravenous Q6H PRN Lucilla Lame, MD      . pantoprazole (PROTONIX) EC tablet 40 mg  40 mg Oral Daily Lucilla Lame, MD   40 mg at 11/04/19 1252  . polyethylene glycol (MIRALAX / GLYCOLAX) packet 17 g  17 g Oral Daily PRN Lucilla Lame, MD      . polyethylene glycol (MIRALAX / GLYCOLAX) packet 17 g  17 g Oral Daily Lucilla Lame, MD   17 g at 11/03/19 1015   . senna (SENOKOT) tablet 8.6 mg  1 tablet Oral BID Lucilla Lame, MD   8.6 mg at 11/03/19 2145  . tamsulosin (FLOMAX) capsule 0.4 mg  0.4 mg Oral Daily Lucilla Lame, MD   0.4 mg at 11/04/19 1245  . traMADol (ULTRAM) tablet 50 mg  50 mg Oral Q6H PRN Lucilla Lame, MD   50 mg at 11/02/19 2054  . vitamin C (ASCORBIC ACID) tablet 500 mg  500 mg Oral BID Lucilla Lame, MD  500 mg at 11/04/19 1244      .  PHYSICAL EXAMINATION:  Vitals:   11/04/19 1140 11/04/19 1224  BP: (!) 140/100 128/75  Pulse:  79  Resp:    Temp:  98 F (36.7 C)  SpO2:  100%   Filed Weights   10/31/19 1325 11/01/19 2336 11/04/19 1032  Weight: (!) 338 lb (153.3 kg) (!) 346 lb 6.4 oz (157.1 kg) (!) 346 lb 6.4 oz (157.1 kg)    Physical Exam  Constitutional:  Morbidly obese.  Resting in the bed.  Oriented x0-1.  HENT:  Head: Normocephalic and atraumatic.  Mouth/Throat: Oropharynx is clear and moist. No oropharyngeal exudate.  Eyes: Pupils are equal, round, and reactive to light.  Neck: Normal range of motion. Neck supple.  Cardiovascular: Normal rate and regular rhythm.  Pulmonary/Chest: No respiratory distress. He has no wheezes.  Decreased air entry bilaterally.  Abdominal: Soft. Bowel sounds are normal. He exhibits no distension and no mass. There is no abdominal tenderness. There is no rebound and no guarding.  Musculoskeletal: Normal range of motion.        General: No tenderness or edema.  Neurological: He is alert.  Skin: Skin is warm.  Psychiatric:  Flat affect/flight of ideas/poor judgment     LABORATORY DATA:  I have reviewed the data as listed Lab Results  Component Value Date   WBC 4.3 11/02/2019   HGB 7.6 (L) 11/02/2019   HCT 25.1 (L) 11/02/2019   MCV 77.5 (L) 11/02/2019   PLT 255 11/02/2019   Recent Labs    09/12/19 0000 09/13/19 0141 09/14/19 0123 10/30/19 1244  NA 142 138 140 139  K 3.6 3.6 4.0 3.8  CL 110 104 105 103  CO2 23 26 26 29   GLUCOSE 115* 99 107* 96  BUN 15 24* 23  12  CREATININE 0.51* 0.47* 0.49* 0.49*  CALCIUM 8.3* 8.0* 8.2* 8.6*  GFRNONAA >60 >60 >60 >60  GFRAA >60 >60 >60 >60  PROT 6.9 6.6 6.6  --   ALBUMIN 2.6* 2.6* 2.6*  --   AST 22 24 22   --   ALT 15 16 17   --   ALKPHOS 81 81 77  --   BILITOT 0.7 0.4 0.6  --     RADIOGRAPHIC STUDIES: I have personally reviewed the radiological images as listed and agreed with the findings in the report. No results found.  Mass of colon #63 year old male patient with multiple medical problems-chronic severe anemia; morbid obesity ; schizophrenia; long-term facility residently current admitted to hospital for severe anemia/newly found colon mass  #Right-sided colon mass-non-obstructing-highly concerning for malignancy.  Biopsy pending./See discussion below  #Severe anemia-needing iron transfusion/blood transfusions-multifactorial-chronic blood loss from #1/underlying rheumatoid arthritis  #Extremely poor IV access  #Comorbid illness-complicating the care morbid obesity/schizophrenia/rheumatoid arthritis/bedbound status  Recommendations:  #Recommend evaluation with surgery for the colon mass. #Given the poor IV access-recommend port placement-given need for blood transfusion/IV iron transfusions outpatient. #Given multiple co-morbidities-poor candidate for any aggressive interventions-recommend palliative care evaluation. Previously, discussed with Dr. Lovie Macadamia patient's physician at [facility] who agrees with palliative care evaluation  #Had a long discussion the patient's niece, Jerry [name] guardian- [phone] the above concerning findings of possible malignancy in the colon needing surgical evaluation.  She understands that patient high risk for surgery given his comorbidities-however would await surgical evaluation for final recommendations.  Thank you Dr.Patel for allowing me to participate in the care of your pleasant patient. Please do not hesitate to contact me with  questions or  concerns in the interim.  Discussed with Dr. Posey Pronto the above plan.  Also discussed with Merrily Pew Borders regarding palliative care evaluation when the hospital.    All questions were answered. The patient knows to call the clinic with any problems, questions or concerns.    Cammie Sickle, MD 11/04/2019 5:17 PM

## 2019-11-04 NOTE — Assessment & Plan Note (Addendum)
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  See discussion below.  CT scan chest and pelvis negative for any obvious metastatic disease.  #Severe anemia-needing iron transfusion/blood transfusions-multifactorial-chronic blood loss from #1/underlying rheumatoid arthritis; proceed with IV iron/PRBC transfusion.  #Extremely poor IV access plan outpatient port placement; discussed with Dr. Lysle Pearl.  #Comorbid illness-complicating the care morbid obesity/schizophrenia/rheumatoid arthritis/bedbound status  Recommendations:  #I had a long discussion the patient's niece, Lynita Lombard guardian- [phone]]-regarding the above concerning findings of adenocarcinoma the colon.  In general patient will need surgery; however given patient's body habitus multimedical problems advance schizophrenia; poor performance status-patient at high risk of adverse events from surgery.  This was discussed by Dr. Lysle Pearl with patient's family.   I also discussed the above difficult situation with the niece at length.  Discussed options include #aggressive treatment options including surgery #hospice #supportive care with transfusions IV iron/PRBC transfusions as needed.  Family interested in latter option; also interested in port placement.   We will follow outpatient/along with palliative care to facilitate latter options.  Discussed with Dr. Hilbert Odor for placement outpatient.  Patient will also need to follow-up with palliative care in the resources. Josh from Palliative care will also follow up at the cancer center. Discussed with Josh.

## 2019-11-05 ENCOUNTER — Encounter: Payer: Self-pay | Admitting: Gastroenterology

## 2019-11-05 ENCOUNTER — Ambulatory Visit: Payer: Self-pay | Admitting: Surgery

## 2019-11-05 DIAGNOSIS — K922 Gastrointestinal hemorrhage, unspecified: Secondary | ICD-10-CM

## 2019-11-05 DIAGNOSIS — Z515 Encounter for palliative care: Secondary | ICD-10-CM

## 2019-11-05 LAB — CBC WITH DIFFERENTIAL/PLATELET
Abs Immature Granulocytes: 0.01 10*3/uL (ref 0.00–0.07)
Basophils Absolute: 0 10*3/uL (ref 0.0–0.1)
Basophils Relative: 0 %
Eosinophils Absolute: 0.3 10*3/uL (ref 0.0–0.5)
Eosinophils Relative: 6 %
HCT: 24.8 % — ABNORMAL LOW (ref 39.0–52.0)
Hemoglobin: 7.2 g/dL — ABNORMAL LOW (ref 13.0–17.0)
Immature Granulocytes: 0 %
Lymphocytes Relative: 19 %
Lymphs Abs: 0.9 10*3/uL (ref 0.7–4.0)
MCH: 22.9 pg — ABNORMAL LOW (ref 26.0–34.0)
MCHC: 29 g/dL — ABNORMAL LOW (ref 30.0–36.0)
MCV: 78.7 fL — ABNORMAL LOW (ref 80.0–100.0)
Monocytes Absolute: 0.4 10*3/uL (ref 0.1–1.0)
Monocytes Relative: 8 %
Neutro Abs: 3.1 10*3/uL (ref 1.7–7.7)
Neutrophils Relative %: 67 %
Platelets: 271 10*3/uL (ref 150–400)
RBC: 3.15 MIL/uL — ABNORMAL LOW (ref 4.22–5.81)
RDW: 17 % — ABNORMAL HIGH (ref 11.5–15.5)
WBC: 4.6 10*3/uL (ref 4.0–10.5)
nRBC: 0 % (ref 0.0–0.2)

## 2019-11-05 LAB — SURGICAL PATHOLOGY

## 2019-11-05 MED ORDER — SODIUM CHLORIDE 0.9% FLUSH
10.0000 mL | INTRAVENOUS | Status: DC | PRN
  Administered 2019-11-16: 10 mL

## 2019-11-05 MED ORDER — CLINDAMYCIN PHOSPHATE 900 MG/50ML IV SOLN
900.0000 mg | INTRAVENOUS | Status: AC
  Filled 2019-11-05: qty 50

## 2019-11-05 MED ORDER — SODIUM CHLORIDE 0.9% FLUSH
10.0000 mL | Freq: Two times a day (BID) | INTRAVENOUS | Status: DC
  Administered 2019-11-05 – 2019-11-08 (×8): 10 mL
  Administered 2019-11-08: 40 mL
  Administered 2019-11-09 – 2019-11-20 (×23): 10 mL

## 2019-11-05 NOTE — Consult Note (Signed)
[facility]  Telephone:([phone] [phone]   Name: Jerry [name] Date: 11/05/2019 MRN: [id number]  DOB: 27-Mar-1956  Patient Care Team: [name], MD as PCP - General (Family Medicine) Jason Coop, NP as Nurse Practitioner (Hospice and Palliative Medicine)    REASON FOR CONSULTATION: Palliative Care consult requested for this 63 y.o. male with multiple medical problems including morbid obesity, schizophrenia, severe RA on Plaquenil with multiple contractures, diastolic dysfunction with history of recurrent CHF, O2 dependent COPD, chronic anemia and thrombocytopenia, who presented to the ER on 10/30/2019 from the [place] [facility] [place] with symptomatic anemia.  Patient was also found to have heme positive stools.  Patient underwent colonoscopy and was found to have a nonobstructing mass in the ascending colon highly concerning for malignancy.  Biopsy pending.  Palliative care was consulted to help address goals and manage ongoing symptoms.  SOCIAL HISTORY:     reports that he has never smoked. He has never used smokeless tobacco. He reports that he does not drink alcohol or use drugs.   Patient was never married.  He had a daughter from whom he is reportedly estranged.  She has lived in [place] since she was a young child.  Patient had 6 siblings but most are now deceased.  Patient has several nieces and nephews who are involved in his care.  Patient has lived in an SNF for the past 15 years.  He has lived at [facility] for the past 5 years.  Patient was in the [employer] and later worked in [employer].  ADVANCE DIRECTIVES:  Not on file  CODE STATUS: DNR  PAST MEDICAL HISTORY: Past Medical History:  Diagnosis Date  . BPH (benign prostatic hyperplasia)   . CHF (congestive heart failure) ([place])   . Chronic pain   . COPD (chronic obstructive pulmonary disease) ([place])   . Hypertension   . Morbid obesity ([place])   . Pressure  ulcer   . Rheumatoid arthritis ([place])   . Schizophrenia ([place])   . Thyroid disease     PAST SURGICAL HISTORY:  Past Surgical History:  Procedure Laterality Date  . COLONOSCOPY WITH PROPOFOL N/A 11/02/2019   Procedure: COLONOSCOPY WITH PROPOFOL;  Surgeon: Lucilla Lame, MD;  Location: [facility] ENDOSCOPY;  Service: Endoscopy;  Laterality: N/A;  . COLONOSCOPY WITH PROPOFOL N/A 11/04/2019   Procedure: COLONOSCOPY WITH PROPOFOL;  Surgeon: Jonathon Bellows, MD;  Location: [facility] ENDOSCOPY;  Service: Gastroenterology;  Laterality: N/A;  . ESOPHAGOGASTRODUODENOSCOPY (EGD) WITH PROPOFOL N/A 10/31/2019   Procedure: ESOPHAGOGASTRODUODENOSCOPY (EGD) WITH PROPOFOL;  Surgeon: Virgel Manifold, MD;  Location: [facility] ENDOSCOPY;  Service: Endoscopy;  Laterality: N/A;    HEMATOLOGY/ONCOLOGY HISTORY:  Oncology History   No history exists.    ALLERGIES:  is allergic to methadone; penicillins; propoxyphene; sulfa antibiotics; and valproic acid and related.  MEDICATIONS:  Current Facility-Administered Medications  Medication Dose Route Frequency Provider Last Rate Last Dose  . 0.9 %  sodium chloride infusion   Intravenous Continuous Jonathon Bellows, MD      . acetaminophen (TYLENOL) tablet 650 mg  650 mg Oral Q6H PRN Lucilla Lame, MD   650 mg at 11/03/19 2146   Or  . acetaminophen (TYLENOL) suppository 650 mg  650 mg Rectal Q6H PRN Lucilla Lame, MD      . albuterol (PROVENTIL) (2.5 MG/3ML) 0.083% nebulizer solution 2.5 mg  2.5 mg Inhalation Q3H PRN Lucilla Lame, MD      . benztropine (COGENTIN) tablet 0.5 mg  0.5 mg  Oral BID Lucilla Lame, MD   0.5 mg at 11/05/19 0834  . Chlorhexidine Gluconate Cloth 2 % PADS 6 each  6 each Topical Q0600 Lucilla Lame, MD   6 each at 11/05/19 0645  . docusate sodium (COLACE) capsule 100 mg  100 mg Oral BID Lucilla Lame, MD   100 mg at 11/05/19 0835  . ferrous sulfate tablet 325 mg  325 mg Oral BID WC Lucilla Lame, MD   325 mg at 11/05/19 1613  . fluPHENAZine (PROLIXIN) tablet 15 mg  15 mg  Oral QHS Lucilla Lame, MD   15 mg at 11/04/19 2155  . fluPHENAZine (PROLIXIN) tablet 15 mg  15 mg Oral Daily Lucilla Lame, MD   15 mg at 11/05/19 0834  . guaiFENesin (ROBITUSSIN) 100 MG/5ML solution 100 mg  100 mg Oral Q4H PRN Lucilla Lame, MD      . hydrocortisone (CORTEF) tablet 10 mg  10 mg Oral BID Lucilla Lame, MD   10 mg at 11/05/19 0834  . hydroxychloroquine (PLAQUENIL) tablet 200 mg  200 mg Oral BID Lucilla Lame, MD   200 mg at 11/05/19 0835  . levothyroxine (SYNTHROID) tablet 200 mcg  200 mcg Oral QAC breakfast Lucilla Lame, MD   200 mcg at 11/05/19 0837  . LORazepam (ATIVAN) tablet 2 mg  2 mg Oral Q6H PRN Lucilla Lame, MD      . metoprolol tartrate (LOPRESSOR) tablet 50 mg  50 mg Oral BID Lucilla Lame, MD   50 mg at 11/05/19 0837  . multivitamin with minerals tablet 1 tablet  1 tablet Oral Daily Lucilla Lame, MD   1 tablet at 11/05/19 [phone]  . mupirocin ointment (BACTROBAN) 2 % 1 application  1 application Nasal BID Lucilla Lame, MD   1 application at [phone]  . ondansetron (ZOFRAN) tablet 4 mg  4 mg Oral Q6H PRN Lucilla Lame, MD       Or  . ondansetron (ZOFRAN) injection 4 mg  4 mg Intravenous Q6H PRN Lucilla Lame, MD      . pantoprazole (PROTONIX) EC tablet 40 mg  40 mg Oral Daily Lucilla Lame, MD   40 mg at 11/05/19 0836  . polyethylene glycol (MIRALAX / GLYCOLAX) packet 17 g  17 g Oral Daily PRN Lucilla Lame, MD      . polyethylene glycol (MIRALAX / GLYCOLAX) packet 17 g  17 g Oral Daily Lucilla Lame, MD   17 g at 11/03/19 1015  . senna (SENOKOT) tablet 8.6 mg  1 tablet Oral BID Lucilla Lame, MD   8.6 mg at 11/05/19 0836  . sodium chloride flush (NS) 0.9 % injection 10-40 mL  10-40 mL Intracatheter Q12H Fritzi Mandes, MD   10 mL at 11/05/19 [id number]  . sodium chloride flush (NS) 0.9 % injection 10-40 mL  10-40 mL Intracatheter PRN Fritzi Mandes, MD      . tamsulosin [facility]) capsule 0.4 mg  0.4 mg Oral Daily Lucilla Lame, MD   0.4 mg at 11/05/19 0836  . traMADol (ULTRAM) tablet 50 mg   50 mg Oral Q6H PRN Lucilla Lame, MD   50 mg at 11/02/19 2054  . vitamin C (ASCORBIC ACID) tablet 500 mg  500 mg Oral BID Lucilla Lame, MD   500 mg at 11/05/19 0836    VITAL SIGNS: BP 126/85 (BP Location: Left Arm)   Pulse 74   Temp 98.8 F (37.1 C) (Oral)   Resp 18   Ht 6\' 2"  (1.88 m)   Wt Marland Kitchen)  346 lb 6.4 oz (157.1 kg)   SpO2 100%   BMI 44.48 kg/m  Filed Weights   10/31/19 1325 11/01/19 2336 11/04/19 1032  Weight: (!) 338 lb (153.3 kg) (!) 346 lb 6.4 oz (157.1 kg) (!) 346 lb 6.4 oz (157.1 kg)    Estimated body mass index is 44.48 kg/m as calculated from the following:   Height as of this encounter: 6\' 2"  (1.88 m).   Weight as of this encounter: 346 lb 6.4 oz (157.1 kg).  LABS: CBC:    Component Value Date/Time   WBC 4.6 11/05/2019 0428   HGB 7.2 (L) 11/05/2019 0428   HGB 11.2 (L) 01/19/2013 1345   HCT 24.8 (L) 11/05/2019 0428   HCT 34.0 (L) 01/19/2013 1345   PLT 271 11/05/2019 0428   PLT 334 01/19/2013 1345   MCV 78.7 (L) 11/05/2019 0428   MCV 88 01/19/2013 1345   NEUTROABS 3.1 11/05/2019 0428   NEUTROABS 4.6 01/19/2013 1345   LYMPHSABS 0.9 11/05/2019 0428   LYMPHSABS 1.3 01/19/2013 1345   MONOABS 0.4 11/05/2019 0428   MONOABS 0.5 01/19/2013 1345   EOSABS 0.3 11/05/2019 0428   EOSABS 0.2 01/19/2013 1345   BASOSABS 0.0 11/05/2019 0428   BASOSABS 0.1 01/19/2013 1345   Comprehensive Metabolic Panel:    Component Value Date/Time   NA 139 10/30/2019 1244   NA 140 01/05/2013 1149   K 3.8 10/30/2019 1244   K 3.8 01/05/2013 1149   CL 103 10/30/2019 1244   CL 105 01/05/2013 1149   CO2 29 10/30/2019 1244   CO2 28 01/05/2013 1149   BUN 12 10/30/2019 1244   BUN 15 01/05/2013 1149   CREATININE 0.49 (L) 10/30/2019 1244   CREATININE 0.75 01/05/2013 1149   GLUCOSE 96 10/30/2019 1244   GLUCOSE 114 (H) 01/05/2013 1149   CALCIUM 8.6 (L) 10/30/2019 1244   CALCIUM 8.5 01/05/2013 1149   AST 22 09/14/2019 0123   AST 20 01/05/2013 1149   ALT 17 09/14/2019 0123   ALT 9 (L)  01/05/2013 1149   ALKPHOS 77 09/14/2019 0123   ALKPHOS 79 01/05/2013 1149   BILITOT 0.6 09/14/2019 0123   BILITOT 0.6 01/05/2013 1149   PROT 6.6 09/14/2019 0123   PROT 8.1 01/05/2013 1149   ALBUMIN 2.6 (L) 09/14/2019 0123   ALBUMIN 2.9 (L) 01/05/2013 1149    RADIOGRAPHIC STUDIES: No results found.  PERFORMANCE STATUS (ECOG) : 4 - Bedbound  Review of Systems Unless otherwise noted, a complete review of systems is negative.  Physical Exam General: Morbidly obese Pulmonary: On O2, unlabored breathing Abdomen: Rotund Extremities: Multiple contractures of fingers Skin: no rashes Neurological: Weakness, confusion  IMPRESSION: Patient seen and examined.  Patient is alert and oriented to person and place.  However, he is situationally confused.  He is unable to engage meaningfully in a conversation regarding goals.  Patient was perseverating on Lelon Perla being beneath his mattress.  Patient seems to lack capacity for decision-making.  He has a court appointed guardian -Engineer, maintenance.  I called and spoke with [name] and her cousin, Lattie Haw.  Both seem to have a reasonable understanding of patient's current medical problems and limitations with treatment.  Patient has been evaluated by surgery and is felt likely to be at high risk for surgical intervention.  He is likely also at high risk for systemic chemotherapy.  We did discuss the option of supportive care at the SNF if patient is felt to have limited options for alternative treatments. Family want  to wait for the results of the biopsy prior to decision making.  At baseline, patient is bed/chair bound.  He has been a resident of an SNF for at least 15 years.  Most recently, he has lived at [facility] for the past five years.   PLAN: -Continue current scope of treatment -Will follow    Time Total: 60 minutes  Visit consisted of counseling and education dealing with the complex and emotionally intense issues of symptom management  and palliative care in the setting of serious and potentially life-threatening illness.Greater than 50%  of this time was spent counseling and coordinating care related to the above assessment and plan.  Signed by: Altha Harm, PhD, NP-C

## 2019-11-05 NOTE — H&P (View-Only) (Signed)
Subjective:   CC: Chronic anemia, ascending colon mass  HPI:  Jerry [name] is a 63 y.o. male who was consulted by Posey Pronto for evaluation of above.   History of schizophrenia, unable to appropriately answer questions.  Per consultation request report, patient is known to be chronically anemic with a recent episode of GI bleeding.  Work-up in the hospital included a colonoscopy and EGD.  EGD negative, but colonoscopy noted a ulcerated ascending colon mass.  Pathology currently pending.  Due to the concern, of persistent bleeding from this area, surgery was consulted for possible excision.  Surgery also consulted for possible port placement due to difficult IV access, and the patient's need for frequent IV medications.    Past Medical History:  has a past medical history of BPH (benign prostatic hyperplasia), CHF (congestive heart failure) ([place]), Chronic pain, COPD (chronic obstructive pulmonary disease) ([place]), Hypertension, Morbid obesity ([place]), Pressure ulcer, Rheumatoid arthritis ([place]), Schizophrenia ([place]), and Thyroid disease.  Past Surgical History:  has a past surgical history that includes Esophagogastroduodenoscopy (egd) with propofol (N/A, 10/31/2019) and Colonoscopy with propofol (N/A, 11/02/2019).  Family History: Family history is unknown by patient.  Social History:  reports that he has never smoked. He has never used smokeless tobacco. He reports that he does not drink alcohol or use drugs.  Current Medications:  Medications Prior to Admission  Medication Sig Dispense Refill  . acetaminophen (TYLENOL) 325 MG tablet Take 650 mg by mouth every 4 (four) hours as needed for mild pain or moderate pain.    Marland Kitchen albuterol (PROVENTIL HFA;VENTOLIN HFA) 108 (90 Base) MCG/ACT inhaler Inhale 1 puff into the lungs every 3 (three) hours as needed for wheezing or shortness of breath.     . Amino Acids-Protein Hydrolys (FEEDING SUPPLEMENT, PRO-STAT SUGAR FREE 64,) LIQD Take 30 mLs by mouth 3 (three) times  daily with meals. Sugar free    . ammonium lactate (LAC-HYDRIN) 12 % lotion Apply 1 application topically 2 (two) times daily. Apply topically to bilateral feet    . benztropine (COGENTIN) 0.5 MG tablet Take 0.5 mg by mouth 2 (two) times daily.    Marland Kitchen docusate sodium (COLACE) 100 MG capsule Take 100 mg by mouth 2 (two) times daily.    . fluPHENAZine (PROLIXIN) 5 MG tablet Take 3 tablets (15 mg total) by mouth daily. 30 tablet 0  . fluPHENAZine (PROLIXIN) 5 MG tablet Take 3 tablets (15 mg total) by mouth at bedtime. 30 tablet 0  . guaifenesin (ROBITUSSIN) 100 MG/5ML syrup Take 100 mg by mouth every 4 (four) hours as needed for cough.    . hydrocortisone (CORTEF) 10 MG tablet Take 1 tablet (10 mg total) by mouth 2 (two) times daily. 20 tablet 0  . hydroxychloroquine (PLAQUENIL) 200 MG tablet Take 200 mg by mouth 2 (two) times daily.    Marland Kitchen levothyroxine (SYNTHROID) 200 MCG tablet Take 200 mcg by mouth daily before breakfast.     . LORazepam (ATIVAN) 2 MG tablet Take 2 mg by mouth every 6 (six) hours as needed for anxiety.    . methocarbamol (ROBAXIN) 750 MG tablet Take 750 mg by mouth at bedtime.    . metoprolol tartrate (LOPRESSOR) 50 MG tablet Take 1 tablet (50 mg total) by mouth 2 (two) times daily.    . Multiple Vitamins-Minerals (MULTIVITAMIN WITH MINERALS) tablet Take 1 tablet by mouth daily.    . Polyethylene Glycol 3350 (MIRALAX PO) Take 17 g by mouth daily.    . tamsulosin (FLOMAX) 0.4 MG CAPS  capsule Take 0.4 mg by mouth daily.    . traMADol (ULTRAM) 50 MG tablet Take 50 mg by mouth every 6 (six) hours as needed for moderate pain or severe pain.    . vitamin C (ASCORBIC ACID) 500 MG tablet Take 500 mg by mouth 2 (two) times daily.       Allergies:  Allergies  Allergen Reactions  . Methadone   . Penicillins     Tolerates cephalosporins   . Propoxyphene   . Sulfa Antibiotics   . Valproic Acid And Related     Thrombocytopenia    ROS:  Unable to obtain secondary to patient's  baseline mentation secondary to schizophrenia. Objective:     BP (!) 156/78 (BP Location: Left Arm)   Pulse 75   Temp 98.6 F (37 C)   Resp 20   Ht 6\' 2"  (1.88 m)   Wt (!) 157.1 kg   SpO2 100%   BMI 44.48 kg/m   Constitutional :  alert, cooperative, appears stated age and no distress  Lymphatics/Throat:  supple without significant adenopathy  Respiratory:  clear to auscultation bilaterally  Cardiovascular:  regular rate and rhythm  Gastrointestinal: soft, non-tender; bowel sounds normal; no masses,  no organomegaly.  Musculoskeletal: Steady movement  Skin: Cool and moist  Psychiatric: Normal affect, non-agitated,       LABS:  CMP Latest Ref Rng & Units 10/30/2019 09/14/2019 09/13/2019  Glucose 70 - 99 mg/dL 96 107(H) 99  BUN 8 - 23 mg/dL 12 23 24(H)  Creatinine 0.61 - 1.24 mg/dL 0.49(L) 0.49(L) 0.47(L)  Sodium 135 - 145 mmol/L 139 140 138  Potassium 3.5 - 5.1 mmol/L 3.8 4.0 3.6  Chloride 98 - 111 mmol/L 103 105 104  CO2 22 - 32 mmol/L 29 26 26   Calcium 8.9 - 10.3 mg/dL 8.6(L) 8.2(L) 8.0(L)  Total Protein 6.5 - 8.1 g/dL - 6.6 6.6  Total Bilirubin 0.3 - 1.2 mg/dL - 0.6 0.4  Alkaline Phos 38 - 126 U/L - 77 81  AST 15 - 41 U/L - 22 24  ALT 0 - 44 U/L - 17 16   CBC Latest Ref Rng & Units 11/02/2019 11/01/2019 10/31/2019  WBC 4.0 - 10.5 K/uL 4.3 - 5.5  Hemoglobin 13.0 - 17.0 g/dL 7.6(L) 7.5(L) 7.8(L)  Hematocrit 39.0 - 52.0 % 25.1(L) 25.3(L) 26.6(L)  Platelets 150 - 400 K/uL 255 - 249    RADS: n/a  Assessment:      Chronic anemia, ulcerated ascending colon mass noted on recent colonoscopy.  Consensus is this is likely the source of his chronic anemia and GI bleed.  Surgery consulted for possible resection.  Plan:    Discussed the case with power of attorney Ms. [name] who is the patient's niece.  She stated that she would prefer to wait for the final path results prior to making a final decision whether or not to proceed with surgery.  I started my discussion with my  extreme hesitancy and reluctance to proceed with a colon resection due to his concurrent comorbidities, including morbid obesity.  Patient is at a very high chance of perioperative complications, which can potentially prolong his recovery, causing him significant amount of discomfort.  Despite these concerns, the power of attorney was still insistent that she will like to wait for the final path results and then make a final decision.  I also asked her about placing a port for future IV access.  I again mentioned to her that he is at a  high risk of complications secondary to his body habitus for this procedure, but patient was agreeable to proceed.  Case is tentatively booked in a couple days.  Further management per primary team.

## 2019-11-05 NOTE — Consult Note (Signed)
Subjective:   CC: Chronic anemia, ascending colon mass  HPI:  Jerry [name] is a 63 y.o. male who was consulted by Posey Pronto for evaluation of above.   History of schizophrenia, unable to appropriately answer questions.  Per consultation request report, patient is known to be chronically anemic with a recent episode of GI bleeding.  Work-up in the hospital included a colonoscopy and EGD.  EGD negative, but colonoscopy noted a ulcerated ascending colon mass.  Pathology currently pending.  Due to the concern, of persistent bleeding from this area, surgery was consulted for possible excision.  Surgery also consulted for possible port placement due to difficult IV access, and the patient's need for frequent IV medications.    Past Medical History:  has a past medical history of BPH (benign prostatic hyperplasia), CHF (congestive heart failure) ([place]), Chronic pain, COPD (chronic obstructive pulmonary disease) ([place]), Hypertension, Morbid obesity ([place]), Pressure ulcer, Rheumatoid arthritis ([place]), Schizophrenia ([place]), and Thyroid disease.  Past Surgical History:  has a past surgical history that includes Esophagogastroduodenoscopy (egd) with propofol (N/A, 10/31/2019) and Colonoscopy with propofol (N/A, 11/02/2019).  Family History: Family history is unknown by patient.  Social History:  reports that he has never smoked. He has never used smokeless tobacco. He reports that he does not drink alcohol or use drugs.  Current Medications:  Medications Prior to Admission  Medication Sig Dispense Refill  . acetaminophen (TYLENOL) 325 MG tablet Take 650 mg by mouth every 4 (four) hours as needed for mild pain or moderate pain.    Marland Kitchen albuterol (PROVENTIL HFA;VENTOLIN HFA) 108 (90 Base) MCG/ACT inhaler Inhale 1 puff into the lungs every 3 (three) hours as needed for wheezing or shortness of breath.     . Amino Acids-Protein Hydrolys (FEEDING SUPPLEMENT, PRO-STAT SUGAR FREE 64,) LIQD Take 30 mLs by mouth 3 (three) times  daily with meals. Sugar free    . ammonium lactate (LAC-HYDRIN) 12 % lotion Apply 1 application topically 2 (two) times daily. Apply topically to bilateral feet    . benztropine (COGENTIN) 0.5 MG tablet Take 0.5 mg by mouth 2 (two) times daily.    Marland Kitchen docusate sodium (COLACE) 100 MG capsule Take 100 mg by mouth 2 (two) times daily.    . fluPHENAZine (PROLIXIN) 5 MG tablet Take 3 tablets (15 mg total) by mouth daily. 30 tablet 0  . fluPHENAZine (PROLIXIN) 5 MG tablet Take 3 tablets (15 mg total) by mouth at bedtime. 30 tablet 0  . guaifenesin (ROBITUSSIN) 100 MG/5ML syrup Take 100 mg by mouth every 4 (four) hours as needed for cough.    . hydrocortisone (CORTEF) 10 MG tablet Take 1 tablet (10 mg total) by mouth 2 (two) times daily. 20 tablet 0  . hydroxychloroquine (PLAQUENIL) 200 MG tablet Take 200 mg by mouth 2 (two) times daily.    Marland Kitchen levothyroxine (SYNTHROID) 200 MCG tablet Take 200 mcg by mouth daily before breakfast.     . LORazepam (ATIVAN) 2 MG tablet Take 2 mg by mouth every 6 (six) hours as needed for anxiety.    . methocarbamol (ROBAXIN) 750 MG tablet Take 750 mg by mouth at bedtime.    . metoprolol tartrate (LOPRESSOR) 50 MG tablet Take 1 tablet (50 mg total) by mouth 2 (two) times daily.    . Multiple Vitamins-Minerals (MULTIVITAMIN WITH MINERALS) tablet Take 1 tablet by mouth daily.    . Polyethylene Glycol 3350 (MIRALAX PO) Take 17 g by mouth daily.    . tamsulosin (FLOMAX) 0.4 MG CAPS  capsule Take 0.4 mg by mouth daily.    . traMADol (ULTRAM) 50 MG tablet Take 50 mg by mouth every 6 (six) hours as needed for moderate pain or severe pain.    . vitamin C (ASCORBIC ACID) 500 MG tablet Take 500 mg by mouth 2 (two) times daily.       Allergies:  Allergies  Allergen Reactions  . Methadone   . Penicillins     Tolerates cephalosporins   . Propoxyphene   . Sulfa Antibiotics   . Valproic Acid And Related     Thrombocytopenia    ROS:  Unable to obtain secondary to patient's  baseline mentation secondary to schizophrenia. Objective:     BP (!) 156/78 (BP Location: Left Arm)   Pulse 75   Temp 98.6 F (37 C)   Resp 20   Ht 6\' 2"  (1.88 m)   Wt (!) 157.1 kg   SpO2 100%   BMI 44.48 kg/m   Constitutional :  alert, cooperative, appears stated age and no distress  Lymphatics/Throat:  supple without significant adenopathy  Respiratory:  clear to auscultation bilaterally  Cardiovascular:  regular rate and rhythm  Gastrointestinal: soft, non-tender; bowel sounds normal; no masses,  no organomegaly.  Musculoskeletal: Steady movement  Skin: Cool and moist  Psychiatric: Normal affect, non-agitated,       LABS:  CMP Latest Ref Rng & Units 10/30/2019 09/14/2019 09/13/2019  Glucose 70 - 99 mg/dL 96 107(H) 99  BUN 8 - 23 mg/dL 12 23 24(H)  Creatinine 0.61 - 1.24 mg/dL 0.49(L) 0.49(L) 0.47(L)  Sodium 135 - 145 mmol/L 139 140 138  Potassium 3.5 - 5.1 mmol/L 3.8 4.0 3.6  Chloride 98 - 111 mmol/L 103 105 104  CO2 22 - 32 mmol/L 29 26 26   Calcium 8.9 - 10.3 mg/dL 8.6(L) 8.2(L) 8.0(L)  Total Protein 6.5 - 8.1 g/dL - 6.6 6.6  Total Bilirubin 0.3 - 1.2 mg/dL - 0.6 0.4  Alkaline Phos 38 - 126 U/L - 77 81  AST 15 - 41 U/L - 22 24  ALT 0 - 44 U/L - 17 16   CBC Latest Ref Rng & Units 11/02/2019 11/01/2019 10/31/2019  WBC 4.0 - 10.5 K/uL 4.3 - 5.5  Hemoglobin 13.0 - 17.0 g/dL 7.6(L) 7.5(L) 7.8(L)  Hematocrit 39.0 - 52.0 % 25.1(L) 25.3(L) 26.6(L)  Platelets 150 - 400 K/uL 255 - 249    RADS: n/a  Assessment:      Chronic anemia, ulcerated ascending colon mass noted on recent colonoscopy.  Consensus is this is likely the source of his chronic anemia and GI bleed.  Surgery consulted for possible resection.  Plan:    Discussed the case with power of attorney Ms. [name] who is the patient's niece.  She stated that she would prefer to wait for the final path results prior to making a final decision whether or not to proceed with surgery.  I started my discussion with my  extreme hesitancy and reluctance to proceed with a colon resection due to his concurrent comorbidities, including morbid obesity.  Patient is at a very high chance of perioperative complications, which can potentially prolong his recovery, causing him significant amount of discomfort.  Despite these concerns, the power of attorney was still insistent that she will like to wait for the final path results and then make a final decision.  I also asked her about placing a port for future IV access.  I again mentioned to her that he is at a  high risk of complications secondary to his body habitus for this procedure, but patient was agreeable to proceed.  Case is tentatively booked in a couple days.  Further management per primary team.

## 2019-11-05 NOTE — Progress Notes (Signed)
FYI

## 2019-11-05 NOTE — Progress Notes (Signed)
[facility] [place] NAME: Jahmai Allport    MR#:  [id number]  DATE OF BIRTH:  15-Nov-1956  SUBJECTIVE:  Awake and alert.  Has confusion and does not answer questions reliably. REVIEW OF SYSTEMS:   Review of Systems  Unable to perform ROS: Psychiatric disorder   Tolerating Diet:heart healthy/carb mod Tolerating PT: does not walk, bedbound at baseline  DRUG ALLERGIES:   Allergies  Allergen Reactions  . Methadone   . Penicillins     Tolerates cephalosporins   . Propoxyphene   . Sulfa Antibiotics   . Valproic Acid And Related     Thrombocytopenia    VITALS:  Blood pressure 126/85, pulse 74, temperature 98.8 F (37.1 C), temperature source Oral, resp. rate 18, height 6\' 2"  (1.88 m), weight (!) 157.1 kg, SpO2 100 %.  PHYSICAL EXAMINATION:   Physical Exam  General exam: Alert, awake, no distress, morbidly obese Respiratory system: Clear to auscultation. Respiratory effort normal. Cardiovascular system:RRR. No murmurs, rubs, gallops. Gastrointestinal system: Abdomen is obese, soft and nontender. No organomegaly or masses felt. Normal bowel sounds heard. Central nervous system: No focal neurological deficits. Extremities: Bilateral chronic lower extremity edema Skin: No rashes, lesions or ulcers Psychiatry: Judgement and insight appear normal. Mood & affect appropriate.    LABORATORY PANEL:  CBC Recent Labs  Lab 11/05/19 0428  WBC 4.6  HGB 7.2*  HCT 24.8*  PLT 271    Chemistries  Recent Labs  Lab 10/30/19 1244  NA 139  K 3.8  CL 103  CO2 29  GLUCOSE 96  BUN 12  CREATININE 0.49*  CALCIUM 8.6*   Cardiac Enzymes No results for input(s): TROPONINI in the last 168 hours. RADIOLOGY:  No results found. ASSESSMENT AND PLAN:  Gerhardt Huaman  is a 63 y.o. male with a known history of morbid obesity, severe rheumatoid arthritis on plaquenil with multiple contractures, CHF diastolic, [name], chronic anemia,  chronic thrombocytopenia, chronic pain due to rheumatoid arthritis comes to the emergency room from cancer center after he was found to have hemoglobin of 6.8.  1.Acute on chronic anemia with heme positive stools and history of severe rheumatoid arthritis -there is a possibility patient could be having slow G.I. bleed -med-list does not show any NSAIDs or blood thinner -patient s/p one unit of blood transfusion -admission hemoglobin 6.8-- one unit blood transfusion--8.0--7.8-7.6-7.2 -IV Protonix BID--change to po PPI -G.I. consultation with Dr. Mickie Hillier appreciated -EGD-- within normal limits -colonoscopy--An ulcerated non-obstructing large mass was found in the proximal       ascending colon. The mass was partially circumferential (involving       one-half of the lumen circumference). The mass measured one cm in       length. No bleeding was present -Biopsy of mass confirms invasive adenocarcinoma -patient is followed closely for his anemia at the cancer center by Dr. Rogue Bussing -Dr. Lysle Pearl from surgery consulted.  Possible surgical options were discussed with the family but at the time of that discussion, biopsy results were not available and family wished to have biopsy results before discussing any further. -Palliative care consultation has been obtained, appreciate assistance -Surgery to place a port prior to patient being discharged given poor IV access. This request was made by Dr. Rogue Bussing. Patient may need IV iron, IV blood transfusion and lab draws as outpatient.  2. Chronic schizophrenia -continue his psych meds  3. COPD on chronic home oxygen -continue inhalers as needed  4.  Chronic diastolic congestive heart failure EF 60% by echo in 2018. Currently does not appear to be in heart failure  5. Chronic thrombocytopenia -continue to monitor  6. BPH on Flomax  At baseline patient is bed bound at baseline and is form a long term facility   Family  Communication: patient's niece [name] on the phone  Consults: G.I, general surgery, oncology, palliative care. Code Status: DNR-- prior to admission. Confirmed with niece [name] who is HCPOA DVT prophylaxis:SCD due to TCP  TOTAL TIME TAKING CARE OF THIS PATIENT: *25* minutes.  >50% time spent on counselling and coordination of care  POSSIBLE D/C IN *few DAYS, DEPENDING ON CLINICAL CONDITION.  Kathie Dike M.D on 11/05/2019 at 7:56 PM  After 6pm go to [email or web address]  [facility]   CC: Primary care physician; [name], MDPatient ID: [name], male   DOB: 05/22/1956, 63 y.o.   MRN: [id number]

## 2019-11-06 ENCOUNTER — Encounter: Payer: Self-pay | Admitting: Anesthesiology

## 2019-11-06 LAB — CBC
HCT: 24.6 % — ABNORMAL LOW (ref 39.0–52.0)
Hemoglobin: 7 g/dL — ABNORMAL LOW (ref 13.0–17.0)
MCH: 23 pg — ABNORMAL LOW (ref 26.0–34.0)
MCHC: 28.5 g/dL — ABNORMAL LOW (ref 30.0–36.0)
MCV: 80.9 fL (ref 80.0–100.0)
Platelets: 169 10*3/uL (ref 150–400)
RBC: 3.04 MIL/uL — ABNORMAL LOW (ref 4.22–5.81)
RDW: 17.4 % — ABNORMAL HIGH (ref 11.5–15.5)
WBC: 6.7 10*3/uL (ref 4.0–10.5)
nRBC: 0 % (ref 0.0–0.2)

## 2019-11-06 LAB — CEA: CEA: 1.1 ng/mL (ref 0.0–4.7)

## 2019-11-06 MED ORDER — FUROSEMIDE 10 MG/ML IJ SOLN
40.0000 mg | Freq: Once | INTRAMUSCULAR | Status: AC
  Administered 2019-11-06: 40 mg via INTRAVENOUS
  Filled 2019-11-06: qty 4

## 2019-11-06 NOTE — Progress Notes (Signed)
   11/06/19 2000  Clinical Encounter Type  Visited With Patient  Visit Type Initial;Spiritual support  Referral From Nurse  Consult/Referral To Chaplain  Spiritual Encounters  Spiritual Needs Other (Comment)  Chaplain received page from nurse, the patient wanted to see a Chaplain. Chaplain arrived and patient was lying in bed. Chaplain introduce herself and the patient repeated the Chaplain as being a Clinical biochemist. However, the rest of the visit was him talking confused. Chaplain allowed patient to just talk and ended the visit later.

## 2019-11-06 NOTE — Progress Notes (Signed)
[facility] [place] NAME: Jerry [name]    MR#:  [id number]  DATE OF BIRTH:  July 22, 1956  SUBJECTIVE:  Confused, cannot provide history. REVIEW OF SYSTEMS:   Review of Systems  Unable to perform ROS: Psychiatric disorder   Tolerating Diet:heart healthy/carb mod Tolerating PT: does not walk, bedbound at baseline  DRUG ALLERGIES:   Allergies  Allergen Reactions  . Methadone   . Penicillins     Tolerates cephalosporins   . Propoxyphene   . Sulfa Antibiotics   . Valproic Acid And Related     Thrombocytopenia    VITALS:  Blood pressure (!) 109/59, pulse 83, temperature 98.7 F (37.1 C), temperature source Oral, resp. rate 18, height 6\' 2"  (1.88 m), weight (!) 157.1 kg, SpO2 96 %.  PHYSICAL EXAMINATION:   Physical Exam  General exam: Alert, awake, no distress Respiratory system: crackles at bases. Respiratory effort normal. Cardiovascular system:RRR. No murmurs, rubs, gallops. Gastrointestinal system: Abdomen is nondistended, soft and nontender. No organomegaly or masses felt. Normal bowel sounds heard. Central nervous system: Alert and oriented. No focal neurological deficits. Extremities: 1+ edema bilaterally Skin: No rashes, lesions or ulcers Psychiatry: confused    LABORATORY PANEL:  CBC Recent Labs  Lab 11/06/19 1127  WBC 6.7  HGB 7.0*  HCT 24.6*  PLT 169    Chemistries  No results for input(s): NA, K, CL, CO2, GLUCOSE, BUN, CREATININE, CALCIUM, MG, AST, ALT, ALKPHOS, BILITOT in the last 168 hours.  Invalid input(s): GFRCGP Cardiac Enzymes No results for input(s): TROPONINI in the last 168 hours. RADIOLOGY:  No results found. ASSESSMENT AND PLAN:  Naguan Mazon  is a 63 y.o. male with a known history of morbid obesity, severe rheumatoid arthritis on plaquenil with multiple contractures, CHF diastolic, [name], chronic anemia, chronic thrombocytopenia, chronic pain due to rheumatoid arthritis comes  to the emergency room from cancer center after he was found to have hemoglobin of 6.8.  1.Acute on chronic anemia with heme positive stools and history of severe rheumatoid arthritis -there is a possibility patient could be having slow G.I. bleed -med-list does not show any NSAIDs or blood thinner -patient s/p one unit of blood transfusion -admission hemoglobin 6.8-- one unit blood transfusion--8.0--7.8-7.6-7.0 -transfuse for hemoglobin less than 7 -IV Protonix BID--change to po PPI -G.I. consultation with Dr. Mickie Hillier appreciated -EGD-- within normal limits -colonoscopy--An ulcerated non-obstructing large mass was found in the proximal       ascending colon. The mass was partially circumferential (involving       one-half of the lumen circumference). The mass measured one cm in       length. No bleeding was present -Biopsy of mass confirms invasive adenocarcinoma -patient is followed closely for his anemia at the cancer center by Dr. Rogue Bussing -Dr. Lysle Pearl from surgery consulted.  Will discuss further surgical options with family, now that pathology results are available -Palliative care consultation has been obtained, appreciate assistance -Surgery to place a port prior to patient being discharged given poor IV access. This request was made by Dr. Rogue Bussing. Patient may need IV iron, IV blood transfusion and lab draws as outpatient.  2. Chronic schizophrenia -continue his psych meds  3. COPD on chronic home oxygen -continue inhalers as needed  4. Chronic diastolic congestive heart failure EF 60% by echo in 2018. He is beginning to show some signs of volume overload. Will give one dose of IV lasix.  5. Chronic thrombocytopenia -continue to  monitor  6. BPH on Flomax  7. Rheumatoid arthritis. On chronic hydroxychloroquine and chronic hydrocortisone.  At baseline patient is bed bound at baseline and is form a long term facility   Family Communication: patient's  niece [name] on the phone  Consults: G.I, general surgery, oncology, palliative care. Code Status: DNR-- prior to admission. Confirmed with niece [name] who is HCPOA DVT prophylaxis:SCD due to TCP  TOTAL TIME TAKING CARE OF THIS PATIENT: *25* minutes.  >50% time spent on counselling and coordination of care  POSSIBLE D/C IN *few DAYS, DEPENDING ON CLINICAL CONDITION.  Kathie Dike M.D on 11/06/2019 at 5:59 PM  After 6pm go to [email or web address]  [facility]   CC: Primary care physician; [name], MDPatient ID: Jerry [name], male   DOB: January 13, 1956, 63 y.o.   MRN: [id number]

## 2019-11-06 NOTE — Interval H&P Note (Signed)
History and Physical Interval Note:  11/06/2019 7:09 AM  Jerry [name]  has presented today for surgery, with the diagnosis of Anemia.  The various methods of treatment have been discussed with the patient and family. After consideration of risks, benefits and other options for treatment, the patient's POA has consented to  Procedure(s): INSERTION PORT-A-CATH (N/A) as a surgical intervention.  Risks include bleeding, infection, unsuccessful placement.  Benefits include long term IV access. Alternatives include continuing PIV access. The patient's history has been reviewed, patient examined, no change in status, stable for surgery.  I have reviewed the patient's chart and labs.  Questions were answered to satisfaction.      [place] [facility] Lysle Pearl

## 2019-11-06 NOTE — Interval H&P Note (Deleted)
History and Physical Interval Note:  11/06/2019 7:08 AM  Jerry [name]  has presented today for surgery, with the diagnosis of Anemia.  The various methods of treatment have been discussed with the patient and family. After consideration of risks, benefits and other options for treatment, the patient's POA has consented to  Procedure(s): INSERTION PORT-A-CATH (N/A) as a surgical intervention.  Risks include bleeding, infection, unsuccessful placement.  Benefits include long term IV access. Alternatives include continuing PIV access. The patient's history has been reviewed, patient examined, no change in status, stable for surgery.  I have reviewed the patient's chart and labs.  Questions were answered to the patient's satisfaction.     Binnie Droessler Lysle Pearl

## 2019-11-07 ENCOUNTER — Other Ambulatory Visit: Payer: Self-pay | Admitting: Hospice and Palliative Medicine

## 2019-11-07 ENCOUNTER — Encounter
Admission: EM | Disposition: A | Discharge status: Skilled Nursing Facility | Payer: Self-pay | Source: Skilled Nursing Facility | Attending: Internal Medicine

## 2019-11-07 ENCOUNTER — Encounter: Payer: Self-pay | Admitting: Internal Medicine

## 2019-11-07 ENCOUNTER — Inpatient Hospital Stay: Payer: Medicaid Other

## 2019-11-07 DIAGNOSIS — K6389 Other specified diseases of intestine: Secondary | ICD-10-CM

## 2019-11-07 DIAGNOSIS — F209 Schizophrenia, unspecified: Secondary | ICD-10-CM

## 2019-11-07 DIAGNOSIS — M069 Rheumatoid arthritis, unspecified: Secondary | ICD-10-CM

## 2019-11-07 DIAGNOSIS — Z515 Encounter for palliative care: Secondary | ICD-10-CM

## 2019-11-07 LAB — RENAL FUNCTION PANEL
Albumin: 2.3 g/dL — ABNORMAL LOW (ref 3.5–5.0)
Anion gap: 8 (ref 5–15)
BUN: 6 mg/dL — ABNORMAL LOW (ref 8–23)
CO2: 32 mmol/L (ref 22–32)
Calcium: 8.2 mg/dL — ABNORMAL LOW (ref 8.9–10.3)
Chloride: 100 mmol/L (ref 98–111)
Creatinine, Ser: 0.38 mg/dL — ABNORMAL LOW (ref 0.61–1.24)
GFR calc Af Amer: >60 mL/min (ref >60)
GFR calc non Af Amer: >60 mL/min (ref >60)
Glucose, Bld: 106 mg/dL — ABNORMAL HIGH (ref 70–99)
Phosphorus: 3.9 mg/dL (ref 2.5–4.6)
Potassium: 3.5 mmol/L (ref 3.5–5.1)
Sodium: 140 mmol/L (ref 135–145)

## 2019-11-07 LAB — CBC
HCT: 24.4 % — ABNORMAL LOW (ref 39.0–52.0)
Hemoglobin: 7.1 g/dL — ABNORMAL LOW (ref 13.0–17.0)
MCH: 22 pg — ABNORMAL LOW (ref 26.0–34.0)
MCHC: 29.1 g/dL — ABNORMAL LOW (ref 30.0–36.0)
MCV: 75.8 fL — ABNORMAL LOW (ref 80.0–100.0)
Platelets: 272 10*3/uL (ref 150–400)
RBC: 3.22 MIL/uL — ABNORMAL LOW (ref 4.22–5.81)
RDW: 17.4 % — ABNORMAL HIGH (ref 11.5–15.5)
WBC: 5.1 10*3/uL (ref 4.0–10.5)
nRBC: 0 % (ref 0.0–0.2)

## 2019-11-07 LAB — PREPARE RBC (CROSSMATCH)

## 2019-11-07 SURGERY — INSERTION, TUNNELED CENTRAL VENOUS DEVICE, WITH PORT
Anesthesia: Choice

## 2019-11-07 MED ORDER — SODIUM CHLORIDE 0.9 % IV SOLN
300.0000 mg | INTRAVENOUS | Status: AC
  Administered 2019-11-07 – 2019-11-09 (×3): 300 mg via INTRAVENOUS
  Filled 2019-11-07 (×3): qty 15

## 2019-11-07 MED ORDER — IOHEXOL 9 MG/ML PO SOLN
500.0000 mL | ORAL | Status: AC
  Administered 2019-11-07 (×2): 500 mL via ORAL

## 2019-11-07 MED ORDER — IOHEXOL 300 MG/ML  SOLN
100.0000 mL | Freq: Once | INTRAMUSCULAR | Status: AC | PRN
  Administered 2019-11-07: 100 mL via INTRAVENOUS

## 2019-11-07 MED ORDER — SODIUM CHLORIDE 0.9% IV SOLUTION
Freq: Once | INTRAVENOUS | Status: AC
  Administered 2019-11-07: 23:00:00 via INTRAVENOUS

## 2019-11-07 MED ORDER — LIDOCAINE HCL (PF) 1 % IJ SOLN
INTRAMUSCULAR | Status: AC
  Filled 2019-11-07: qty 30

## 2019-11-07 MED ORDER — CHLORHEXIDINE GLUCONATE CLOTH 2 % EX PADS
6.0000 | MEDICATED_PAD | Freq: Once | CUTANEOUS | Status: AC
  Administered 2019-11-07: 6 via TOPICAL

## 2019-11-07 MED ORDER — BUPIVACAINE HCL (PF) 0.5 % IJ SOLN
INTRAMUSCULAR | Status: AC
  Filled 2019-11-07: qty 30

## 2019-11-07 MED ORDER — LEVOTHYROXINE SODIUM 100 MCG PO TABS
200.0000 ug | ORAL_TABLET | Freq: Every day | ORAL | Status: DC
  Administered 2019-11-09 – 2019-11-20 (×12): 200 ug via ORAL
  Filled 2019-11-07 (×13): qty 2

## 2019-11-07 MED ORDER — HEPARIN SOD (PORK) LOCK FLUSH 100 UNIT/ML IV SOLN
INTRAVENOUS | Status: AC
  Filled 2019-11-07: qty 5

## 2019-11-07 SURGICAL SUPPLY — 33 items
BAG DECANTER FOR FLEXI CONT (MISCELLANEOUS) ×4 IMPLANT
BENZOIN TINCTURE PRP APPL 2/3 (GAUZE/BANDAGES/DRESSINGS) ×4 IMPLANT
BLADE SURG SZ11 CARB STEEL (BLADE) ×4 IMPLANT
BOOT SUTURE AID YELLOW STND (SUTURE) ×4 IMPLANT
CANISTER SUCT 1200ML W/VALVE (MISCELLANEOUS) ×4 IMPLANT
CHLORAPREP W/TINT 26 (MISCELLANEOUS) ×4 IMPLANT
CLOSURE WOUND 1/2 X4 (GAUZE/BANDAGES/DRESSINGS) ×1
COVER LIGHT HANDLE STERIS (MISCELLANEOUS) ×8 IMPLANT
COVER WAND RF STERILE (DRAPES) ×4 IMPLANT
DECANTER SPIKE VIAL GLASS SM (MISCELLANEOUS) ×4 IMPLANT
DRAPE C-ARM XRAY 36X54 (DRAPES) ×4 IMPLANT
DRSG TEGADERM 4X4.75 (GAUZE/BANDAGES/DRESSINGS) ×4 IMPLANT
ELECT CAUTERY BLADE 6.4 (BLADE) ×4 IMPLANT
ELECT REM PT RETURN 9FT ADLT (ELECTROSURGICAL) ×3
ELECTRODE REM PT RTRN 9FT ADLT (ELECTROSURGICAL) ×2 IMPLANT
GLOVE BIOGEL PI IND STRL 7.0 (GLOVE) ×2 IMPLANT
GLOVE BIOGEL PI INDICATOR 7.0 (GLOVE) ×2
GLOVE SURG SYN 6.5 ES PF (GLOVE) ×3 IMPLANT
GLOVE SURG SYN 6.5 PF PI (GLOVE) ×1 IMPLANT
GOWN STRL REUS W/ TWL LRG LVL3 (GOWN DISPOSABLE) ×4 IMPLANT
GOWN STRL REUS W/TWL LRG LVL3 (GOWN DISPOSABLE) ×4
IV NS 500ML (IV SOLUTION) ×2
IV NS 500ML BAXH (IV SOLUTION) ×2 IMPLANT
KIT TURNOVER KIT A (KITS) ×4 IMPLANT
LABEL OR SOLS (LABEL) ×4 IMPLANT
PACK PORT-A-CATH (MISCELLANEOUS) ×4 IMPLANT
SPONGE VERSALON 4X4 4PLY (MISCELLANEOUS) ×4 IMPLANT
STRIP CLOSURE SKIN 1/2X4 (GAUZE/BANDAGES/DRESSINGS) ×3 IMPLANT
SUT MNCRL AB 4-0 PS2 18 (SUTURE) ×4 IMPLANT
SUT PROLENE 2 0 SH DA (SUTURE) ×4 IMPLANT
SUT VIC AB 3-0 SH 27 (SUTURE) ×2
SUT VIC AB 3-0 SH 27X BRD (SUTURE) ×2 IMPLANT
SYR 10ML LL (SYRINGE) ×4 IMPLANT

## 2019-11-07 NOTE — TOC Progression Note (Signed)
Transition of Care [facility]) - Progression Note    Patient Details  Name: Jerry [name] MRN: [id number] Date of Birth: 1956/11/13  [facility] CM/SW Contact  Beverly Sessions, RN Phone Number: 11/07/2019, 3:39 PM  Clinical Narrative:     Per MD patient potential to discharge over the weekend.   RNCM spoke with Otila Kluver at [facility] and confirms he can return over the weekend  MD to order repeat covid test  Attempted to call guardian to update.  Rang multiple times, did not have the option to leave a voicemail  Expected Discharge Plan: [place] Barriers to Discharge: Continued Medical Work up  Expected Discharge Plan and Services Expected Discharge Plan: [place] In-house Referral: Clinical Social Work     Living arrangements for the past 2 months: [place]                                       Social Determinants of Health (SDOH) Interventions    Readmission Risk Interventions Readmission Risk Prevention Plan 11/03/2019  Transportation Screening Complete  HRI or [place] Not Complete  HRI or Home Care Consult comments Pt lives at a [facility] for [place] Planning/Counseling [place] Not Applicable  Medication Review [employer]) Complete  Some recent data might be hidden

## 2019-11-07 NOTE — Interval H&P Note (Signed)
History and Physical Interval Note:  11/07/2019 9:52 AM  Jerry [name]  has presented today for surgery, with the diagnosis of Anemia.  The various methods of treatment have been discussed with the patient and family. After consideration of risks, benefits and other options for treatment, the patient has consented to  Procedure(s): INSERTION PORT-A-CATH (N/A) as a surgical intervention.  The patient's history has been reviewed, patient examined, no change in status, stable for surgery.  I have reviewed the patient's chart and labs.  Questions were answered to the patient's satisfaction.     Yaacov Koziol Lysle Pearl

## 2019-11-07 NOTE — Progress Notes (Signed)
Jerry [name]   DOB:12-12-1955   [id number]    Subjective: Patient resting comfortably in the bed.  He denies any pain.    .Review of Systems  Unable to perform ROS: Psychiatric disorder  General-denies Objective:  Vitals:   11/07/19 1240 11/07/19 2125  BP: 138/89 (!) 151/80  Pulse: 83 81  Resp:  20  Temp:  98.7 F (37.1 C)  SpO2: 100% 98%     Intake/Output Summary (Last 24 hours) at 11/07/2019 2236 Last data filed at 11/07/2019 2127 Gross per 24 hour  Intake 600 ml  Output 1650 ml  Net -1050 ml    Physical Exam  Constitutional:  Morbidly obese African-American male patient.  HENT:  Head: Normocephalic and atraumatic.  Mouth/Throat: Oropharynx is clear and moist. No oropharyngeal exudate.  Eyes: Pupils are equal, round, and reactive to light.  Cardiovascular: Normal rate and regular rhythm.  Pulmonary/Chest: No respiratory distress. He has no wheezes.  Decreased air entry bilaterally.  Abdominal: Soft. Bowel sounds are normal. He exhibits no distension and no mass. There is no abdominal tenderness. There is no rebound and no guarding.  Musculoskeletal:        General: No tenderness or edema. Normal range of motion.     Cervical back: Normal range of motion and neck supple.  Neurological: He is alert.  Skin: Skin is warm.  Psychiatric:  Flat affect; poor judgment     Labs:  Lab Results  Component Value Date   WBC 5.1 11/07/2019   HGB 7.1 (L) 11/07/2019   HCT 24.4 (L) 11/07/2019   MCV 75.8 (L) 11/07/2019   PLT 272 11/07/2019   NEUTROABS 3.1 11/05/2019    Lab Results  Component Value Date   NA 140 11/07/2019   K 3.5 11/07/2019   CL 100 11/07/2019   CO2 32 11/07/2019    Studies:  CT CHEST W CONTRAST  Result Date: 11/07/2019 CLINICAL DATA:  Staging for colorectal carcinoma. Colon mass seen on colonoscopy on 11/02/2019. EXAM: CT CHEST, ABDOMEN, AND PELVIS WITH CONTRAST TECHNIQUE: Multidetector CT imaging of the chest, abdomen and pelvis was performed  following the standard protocol during bolus administration of intravenous contrast. CONTRAST:  172mL OMNIPAQUE IOHEXOL 300 MG/ML  SOLN COMPARISON:  None. FINDINGS: CT CHEST FINDINGS Cardiovascular: Heart is normal size. No pericardial effusion. Three-vessel coronary artery calcifications. Great vessels are normal in caliber. Mild aortic atherosclerosis. Mediastinum/Nodes: No neck base, axillary, mediastinal or hilar masses or enlarged lymph nodes. Trachea and esophagus are unremarkable. Lungs/Pleura: No lung mass or nodule. Bilateral areas of bronchiectasis, irregular interstitial thickening and architectural distortion with honeycombing noted in the posterior inferior lower lobes and, to lesser degree, left upper lobe lingula. Findings consistent with interstitial fibrosis, which was present at the lung bases on the prior CT. No evidence of pneumonia or pulmonary edema. No pleural effusion or pneumothorax. Musculoskeletal: No fracture or acute finding. No osteoblastic or osteolytic lesions. CT ABDOMEN PELVIS FINDINGS Hepatobiliary: Somewhat limited due to artifact from the overlying left arm. Liver normal in size. No mass or focal lesion. Subtle dependent gallstones. No gallbladder wall thickening or evidence of acute cholecystitis. No bile duct dilation. Pancreas: Unremarkable. No pancreatic ductal dilatation or surrounding inflammatory changes. Spleen: Normal in size without focal abnormality. Adrenals/Urinary Tract: 14 mm left adrenal nodule, unchanged compared to the prior CT. Normal right adrenal gland. Kidneys normal in size, orientation and position with symmetric enhancement and excretion. No masses, stones or hydronephrosis. Normal ureters. Normal bladder. Stomach/Bowel: Colon is normal in caliber.  The colon mass noted on the recent colonoscopy is not defined on CT. No wall thickening or inflammation. Stomach is unremarkable. Small bowel is normal in caliber with no wall thickening or inflammation. No  evidence of appendicitis. Appendix not definitively seen. Vascular/Lymphatic: Mild aortic atherosclerosis. No aneurysm. No enlarged lymph nodes. Reproductive: Prostate not enlarged. Other: Small umbilical hernia. No ascites. There is subcutaneous soft tissue edema evident along right lateral thigh to right pelvis. Musculoskeletal: Chronic fracture of the left femoral neck with fragmentation of the femoral head, widening of the acetabula and acetabular protrusio. There are advanced right hip joint arthropathic changes. These findings are chronic. There are no acute fractures. No osteoblastic or osteolytic lesions. IMPRESSION: 1. No evidence of locally invasive colorectal carcinoma or of metastatic disease. 2. The colon mass noted on the recent colonoscopy is not visualized on CT. 3. No acute abnormalities within the chest, abdomen or pelvis. 4. Coronary artery calcifications and mild aortic atherosclerosis. 5. Lungs demonstrate changes of interstitial fibrosis. 6. Small dependent gallstones, but no evidence of acute cholecystitis. 7. Marked arthropathic changes the hips, also noted of the shoulders. Findings consistent with rheumatoid arthritis. Electronically Signed   By: Lajean Manes M.D.   On: 11/07/2019 20:00   CT ABDOMEN PELVIS W CONTRAST  Result Date: 11/07/2019 CLINICAL DATA:  Staging for colorectal carcinoma. Colon mass seen on colonoscopy on 11/02/2019. EXAM: CT CHEST, ABDOMEN, AND PELVIS WITH CONTRAST TECHNIQUE: Multidetector CT imaging of the chest, abdomen and pelvis was performed following the standard protocol during bolus administration of intravenous contrast. CONTRAST:  151mL OMNIPAQUE IOHEXOL 300 MG/ML  SOLN COMPARISON:  None. FINDINGS: CT CHEST FINDINGS Cardiovascular: Heart is normal size. No pericardial effusion. Three-vessel coronary artery calcifications. Great vessels are normal in caliber. Mild aortic atherosclerosis. Mediastinum/Nodes: No neck base, axillary, mediastinal or hilar  masses or enlarged lymph nodes. Trachea and esophagus are unremarkable. Lungs/Pleura: No lung mass or nodule. Bilateral areas of bronchiectasis, irregular interstitial thickening and architectural distortion with honeycombing noted in the posterior inferior lower lobes and, to lesser degree, left upper lobe lingula. Findings consistent with interstitial fibrosis, which was present at the lung bases on the prior CT. No evidence of pneumonia or pulmonary edema. No pleural effusion or pneumothorax. Musculoskeletal: No fracture or acute finding. No osteoblastic or osteolytic lesions. CT ABDOMEN PELVIS FINDINGS Hepatobiliary: Somewhat limited due to artifact from the overlying left arm. Liver normal in size. No mass or focal lesion. Subtle dependent gallstones. No gallbladder wall thickening or evidence of acute cholecystitis. No bile duct dilation. Pancreas: Unremarkable. No pancreatic ductal dilatation or surrounding inflammatory changes. Spleen: Normal in size without focal abnormality. Adrenals/Urinary Tract: 14 mm left adrenal nodule, unchanged compared to the prior CT. Normal right adrenal gland. Kidneys normal in size, orientation and position with symmetric enhancement and excretion. No masses, stones or hydronephrosis. Normal ureters. Normal bladder. Stomach/Bowel: Colon is normal in caliber. The colon mass noted on the recent colonoscopy is not defined on CT. No wall thickening or inflammation. Stomach is unremarkable. Small bowel is normal in caliber with no wall thickening or inflammation. No evidence of appendicitis. Appendix not definitively seen. Vascular/Lymphatic: Mild aortic atherosclerosis. No aneurysm. No enlarged lymph nodes. Reproductive: Prostate not enlarged. Other: Small umbilical hernia. No ascites. There is subcutaneous soft tissue edema evident along right lateral thigh to right pelvis. Musculoskeletal: Chronic fracture of the left femoral neck with fragmentation of the femoral head, widening  of the acetabula and acetabular protrusio. There are advanced right hip joint arthropathic changes.  These findings are chronic. There are no acute fractures. No osteoblastic or osteolytic lesions. IMPRESSION: 1. No evidence of locally invasive colorectal carcinoma or of metastatic disease. 2. The colon mass noted on the recent colonoscopy is not visualized on CT. 3. No acute abnormalities within the chest, abdomen or pelvis. 4. Coronary artery calcifications and mild aortic atherosclerosis. 5. Lungs demonstrate changes of interstitial fibrosis. 6. Small dependent gallstones, but no evidence of acute cholecystitis. 7. Marked arthropathic changes the hips, also noted of the shoulders. Findings consistent with rheumatoid arthritis. Electronically Signed   By: Lajean Manes M.D.   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  See discussion below.  CT scan chest and pelvis negative for any obvious metastatic disease.  #Severe anemia-needing iron transfusion/blood transfusions-multifactorial-chronic blood loss from #1/underlying rheumatoid arthritis; proceed with IV iron/PRBC transfusion.  #Extremely poor IV access plan outpatient port placement; discussed with Dr. Lysle Pearl.  #Comorbid illness-complicating the care morbid obesity/schizophrenia/rheumatoid arthritis/bedbound status  Recommendations:  #I had a long discussion the patient's niece, Lynita Lombard guardian- [phone]]-regarding the above concerning findings of adenocarcinoma the colon.  In general patient will need surgery; however given patient's body habitus multimedical problems advance schizophrenia; poor performance status-patient at high risk of adverse events from surgery.   This was discussed by Dr. Lysle Pearl with patient's family.   I also discussed the above difficult situation with the niece at length.  Discussed options include #aggressive treatment options including surgery #hospice #supportive care with transfusions IV iron/PRBC transfusions as needed.  Family interested in latter option; also interested in port placement.   We will follow outpatient/along with palliative care to facilitate latter options.  Discussed with Dr. Hilbert Odor for placement outpatient.  Patient will also need to follow-up with palliative care in the resources. Josh from Palliative care will also follow up at the cancer center. Discussed with Josh.    Cammie Sickle, MD 11/07/2019  10:36 PM

## 2019-11-07 NOTE — Plan of Care (Signed)
  Problem: Education: Goal: Knowledge of General Education information will improve Description: Including pain rating scale, medication(s)/side effects and non-pharmacologic comfort measures Outcome: Not Progressing Pt is A/O to self and does not understands his POC.  Will continue to offer support.     Problem: Clinical Measurements: Goal: Ability to maintain clinical measurements within normal limits will improve Outcome: Progressing VS and labs WNL.  Awaiting AM labs to further assess.    Problem: Clinical Measurements: Goal: Will remain free from infection Outcome: Progressing  S/Sx of infection monitor and assessed q-shift/ PRN, along with VS.  Pt has remained afebrile thus far.  Will continue to monitor and assess.    Problem: Clinical Measurements: Goal: Respiratory complications will improve Outcome: Progressing Respiratory status monitored and assessed q-shift/ PRN, along with VS.  Pt is on 2L of O2 with O2 saturations at 100% and respiration rate of 20 breaths per minute.  Pt has not expressed SOB or DOE.  Will continue to monitor and assess.    Problem: Clinical Measurements: Goal: Cardiovascular complication will be avoided Outcome: Progressing VS WNL and cardiovascular intact.  Will continue to monitor and assess.     Problem: Activity: Goal: Risk for activity intolerance will decrease Outcome: Progressing Pt is incontinent of both his bowels and bladder.  He needs to the assistance of RN staff to reposition himself in the bed.  Will continue to monitor and assess.     Problem: Nutrition: Goal: Adequate nutrition will be maintained Outcome: Progressing Pt is currently NPO per MD's orders for a port-a-cath placement today.  Prior to that he was on a heart healthy diet and was tolerating it well.  Will continue to monitor and assess.     Problem: Coping: Goal: Level of anxiety will decrease Outcome: Progressing Pt has not expressed anxiety r/t his hospitalization  this shift.  Will continue to offer support and monitor.          Problem: Elimination: Goal: Will not experience complications related to bowel motility Outcome: Progressing Pt is incontinent of both his bowels and bladder.  He has had x1 large BM this shift.  Will continue to monitor and assess.    Problem: Pain Managment: Goal: General experience of comfort will improve Outcome: Progressing Pt has denied pain thus far.  Will continue to monitor and assess.    Problem: Safety: Goal: Ability to remain free from injury will improve Outcome: Progressing Instructed pt to utilize RN call light for assistance.  Hourly rounds performed.  Bed alarm implemented to keep pt safe from falls.  Settings set to third most sensitive mode. Bed in lowest position, locked with two upper/ one lowe side rails engaged.  Belongings and call light within reach.      Problem: Skin Integrity: Goal: Risk for impaired skin integrity will decrease Outcome: Progressing Skin integrity monitored and assessed q-shift/ PRN.  Instructed pt to turn q2 hours to prevent further skin impairment.  Tubes and drains assessed for device related pressure sores.  Dressing changes performed per MD's orders.  Will continue to monitor and assess.

## 2019-11-07 NOTE — Progress Notes (Signed)
[place]  Telephone:([phone] [phone]   Name: Jerry [name] Date: 11/07/2019 MRN: [id number]  DOB: 1956/03/31  Patient Care Team: [name], MD as PCP - General (Family Medicine) Jason Coop, NP as Nurse Practitioner (Hospice and Palliative Medicine)    REASON FOR CONSULTATION: Palliative Care consult requested for this 63 y.o. male with multiple medical problems including morbid obesity, schizophrenia, severe RA on Plaquenil with multiple contractures, diastolic dysfunction with history of recurrent CHF, O2 dependent COPD, chronic anemia and thrombocytopenia, who presented to the ER on 10/30/2019 from the [facility] with symptomatic anemia.  Patient was also found to have heme positive stools.  Patient underwent colonoscopy and was found to have a nonobstructing mass in the ascending colon highly concerning for malignancy.  Biopsy pending.  Palliative care was consulted to help address goals and manage ongoing symptoms.   CODE STATUS: DNR  PAST MEDICAL HISTORY: Past Medical History:  Diagnosis Date  . BPH (benign prostatic hyperplasia)   . CHF (congestive heart failure) ([place])   . Chronic pain   . COPD (chronic obstructive pulmonary disease) ([place])   . Hypertension   . Morbid obesity ([place])   . Pressure ulcer   . Rheumatoid arthritis ([place])   . Schizophrenia ([place])   . Thyroid disease     PAST SURGICAL HISTORY:  Past Surgical History:  Procedure Laterality Date  . COLONOSCOPY WITH PROPOFOL N/A 11/02/2019   Procedure: COLONOSCOPY WITH PROPOFOL;  Surgeon: Lucilla Lame, MD;  Location: [facility] ENDOSCOPY;  Service: Endoscopy;  Laterality: N/A;  . COLONOSCOPY WITH PROPOFOL N/A 11/04/2019   Procedure: COLONOSCOPY WITH PROPOFOL;  Surgeon: Jonathon Bellows, MD;  Location: [facility] ENDOSCOPY;  Service: Gastroenterology;  Laterality: N/A;  . ESOPHAGOGASTRODUODENOSCOPY (EGD) WITH PROPOFOL N/A 10/31/2019   Procedure:  ESOPHAGOGASTRODUODENOSCOPY (EGD) WITH PROPOFOL;  Surgeon: Virgel Manifold, MD;  Location: [facility] ENDOSCOPY;  Service: Endoscopy;  Laterality: N/A;    HEMATOLOGY/ONCOLOGY HISTORY:  Oncology History   No history exists.    ALLERGIES:  is allergic to methadone; penicillins; propoxyphene; sulfa antibiotics; and valproic acid and related.  MEDICATIONS:  Current Facility-Administered Medications  Medication Dose Route Frequency Provider Last Rate Last Admin  . 0.9 %  sodium chloride infusion (Manually program via Guardrails IV Fluids)   Intravenous Once Kathie Dike, MD      . 0.9 %  sodium chloride infusion   Intravenous Continuous Jonathon Bellows, MD   New Bag at 11/04/19 1048  . acetaminophen (TYLENOL) tablet 650 mg  650 mg Oral Q6H PRN Lucilla Lame, MD   650 mg at 11/06/19 [id number]   Or  . acetaminophen (TYLENOL) suppository 650 mg  650 mg Rectal Q6H PRN Lucilla Lame, MD      . albuterol (PROVENTIL) (2.5 MG/3ML) 0.083% nebulizer solution 2.5 mg  2.5 mg Inhalation Q3H PRN Lucilla Lame, MD      . benztropine (COGENTIN) tablet 0.5 mg  0.5 mg Oral BID Lucilla Lame, MD   0.5 mg at 11/06/19 2316  . docusate sodium (COLACE) capsule 100 mg  100 mg Oral BID Lucilla Lame, MD   100 mg at 11/06/19 2316  . ferrous sulfate tablet 325 mg  325 mg Oral BID WC Lucilla Lame, MD   325 mg at 11/07/19 1633  . fluPHENAZine (PROLIXIN) tablet 15 mg  15 mg Oral QHS Lucilla Lame, MD   15 mg at 11/06/19 2317  . fluPHENAZine (PROLIXIN) tablet 15 mg  15 mg Oral Daily Lucilla Lame, MD  15 mg at 11/07/19 1634  . guaiFENesin (ROBITUSSIN) 100 MG/5ML solution 100 mg  100 mg Oral Q4H PRN Lucilla Lame, MD      . hydrocortisone (CORTEF) tablet 10 mg  10 mg Oral BID Lucilla Lame, MD   10 mg at 11/06/19 2315  . hydroxychloroquine (PLAQUENIL) tablet 200 mg  200 mg Oral BID Lucilla Lame, MD   200 mg at 11/06/19 2316  . iron sucrose (VENOFER) 300 mg in sodium chloride 0.9 % 250 mL IVPB  300 mg Intravenous Q24H Charlaine Dalton R,  MD      . levothyroxine (SYNTHROID) tablet 200 mcg  200 mcg Oral Q0600 Kathie Dike, MD      . LORazepam (ATIVAN) tablet 2 mg  2 mg Oral Q6H PRN Lucilla Lame, MD      . metoprolol tartrate (LOPRESSOR) tablet 50 mg  50 mg Oral BID Lucilla Lame, MD   50 mg at 11/06/19 2316  . multivitamin with minerals tablet 1 tablet  1 tablet Oral Daily Lucilla Lame, MD   1 tablet at 11/06/19 0831  . ondansetron (ZOFRAN) tablet 4 mg  4 mg Oral Q6H PRN Lucilla Lame, MD       Or  . ondansetron (ZOFRAN) injection 4 mg  4 mg Intravenous Q6H PRN Lucilla Lame, MD      . pantoprazole (PROTONIX) EC tablet 40 mg  40 mg Oral Daily Lucilla Lame, MD   40 mg at 11/07/19 1633  . polyethylene glycol (MIRALAX / GLYCOLAX) packet 17 g  17 g Oral Daily PRN Lucilla Lame, MD      . polyethylene glycol (MIRALAX / GLYCOLAX) packet 17 g  17 g Oral Daily Lucilla Lame, MD   17 g at 11/06/19 [id number]  . senna (SENOKOT) tablet 8.6 mg  1 tablet Oral BID Lucilla Lame, MD   8.6 mg at 11/06/19 2316  . sodium chloride flush (NS) 0.9 % injection 10-40 mL  10-40 mL Intracatheter Q12H Fritzi Mandes, MD   10 mL at 11/07/19 1230  . sodium chloride flush (NS) 0.9 % injection 10-40 mL  10-40 mL Intracatheter PRN Fritzi Mandes, MD      . tamsulosin [facility]) capsule 0.4 mg  0.4 mg Oral Daily Lucilla Lame, MD   0.4 mg at 11/07/19 1633  . traMADol (ULTRAM) tablet 50 mg  50 mg Oral Q6H PRN Lucilla Lame, MD   50 mg at 11/07/19 1633  . vitamin C (ASCORBIC ACID) tablet 500 mg  500 mg Oral BID Lucilla Lame, MD   500 mg at 11/06/19 2315    VITAL SIGNS: BP 138/89   Pulse 83   Temp 99 F (37.2 C) (Oral)   Resp 20   Ht 6\' 2"  (1.88 m)   Wt (!) 346 lb 6.4 oz (157.1 kg)   SpO2 100%   BMI 44.48 kg/m  Filed Weights   10/31/19 1325 11/01/19 2336 11/04/19 1032  Weight: (!) 338 lb (153.3 kg) (!) 346 lb 6.4 oz (157.1 kg) (!) 346 lb 6.4 oz (157.1 kg)    Estimated body mass index is 44.48 kg/m as calculated from the following:   Height as of this encounter: 6\' 2"   (1.88 m).   Weight as of this encounter: 346 lb 6.4 oz (157.1 kg).  LABS: CBC:    Component Value Date/Time   WBC 5.1 11/07/2019 0618   HGB 7.1 (L) 11/07/2019 0618   HGB 11.2 (L) 01/19/2013 1345   HCT 24.4 (L) 11/07/2019 0618   HCT 34.0 (  L) 01/19/2013 1345   PLT 272 11/07/2019 0618   PLT 334 01/19/2013 1345   MCV 75.8 (L) 11/07/2019 0618   MCV 88 01/19/2013 1345   NEUTROABS 3.1 11/05/2019 0428   NEUTROABS 4.6 01/19/2013 1345   LYMPHSABS 0.9 11/05/2019 0428   LYMPHSABS 1.3 01/19/2013 1345   MONOABS 0.4 11/05/2019 0428   MONOABS 0.5 01/19/2013 1345   EOSABS 0.3 11/05/2019 0428   EOSABS 0.2 01/19/2013 1345   BASOSABS 0.0 11/05/2019 0428   BASOSABS 0.1 01/19/2013 1345   Comprehensive Metabolic Panel:    Component Value Date/Time   NA 140 11/07/2019 0618   NA 140 01/05/2013 1149   K 3.5 11/07/2019 0618   K 3.8 01/05/2013 1149   CL 100 11/07/2019 0618   CL 105 01/05/2013 1149   CO2 32 11/07/2019 0618   CO2 28 01/05/2013 1149   BUN 6 (L) 11/07/2019 0618   BUN 15 01/05/2013 1149   CREATININE 0.38 (L) 11/07/2019 0618   CREATININE 0.75 01/05/2013 1149   GLUCOSE 106 (H) 11/07/2019 0618   GLUCOSE 114 (H) 01/05/2013 1149   CALCIUM 8.2 (L) 11/07/2019 0618   CALCIUM 8.5 01/05/2013 1149   AST 22 09/14/2019 0123   AST 20 01/05/2013 1149   ALT 17 09/14/2019 0123   ALT 9 (L) 01/05/2013 1149   ALKPHOS 77 09/14/2019 0123   ALKPHOS 79 01/05/2013 1149   BILITOT 0.6 09/14/2019 0123   BILITOT 0.6 01/05/2013 1149   PROT 6.6 09/14/2019 0123   PROT 8.1 01/05/2013 1149   ALBUMIN 2.3 (L) 11/07/2019 0618   ALBUMIN 2.9 (L) 01/05/2013 1149    RADIOGRAPHIC STUDIES: No results found.  PERFORMANCE STATUS (ECOG) : 4 - Bedbound  Review of Systems Unable to complete  Physical Exam General: NAD, frail appearing, obese Pulmonary: unlabored Abdomen: rotund GU: no suprapubic tenderness Extremities: edema Skin: no rashes Neurological: Weakness, confusion  IMPRESSION: Patient remains  confused and is unable to engage meaningfully in conversation regarding goals.  He is comfortable appearing offers no acute complaints.  Note plan to discharge back to SNF after CT C/A/P.  Patient will then be scheduled for port placement at a later date. He will follow up with Dr. Rogue Bussing and me in the clinic to continue conversations regarding goals.   Will also plan to consult palliative care at SNF  PLAN: -Continue current scope of treatment -Discharge to SNF when medically ready -Palliative care at SNF -Will also plan clinic follow up next week   Time Total: 15 minutes  Visit consisted of counseling and education dealing with the complex and emotionally intense issues of symptom management and palliative care in the setting of serious and potentially life-threatening illness.Greater than 50%  of this time was spent counseling and coordinating care related to the above assessment and plan.  Signed by: Altha Harm, PhD, NP-C

## 2019-11-07 NOTE — NC FL2 (Signed)
[place] LEVEL OF CARE SCREENING TOOL     IDENTIFICATION  Patient Name: Jerry [name] Birthdate: [birth year] Sex: male Admission Date (Current Location): 10/30/2019  [facility] and [place] Number:  Engineering geologist and Address:         Provider Number: [id number]  Attending Physician Name and Address:  Kathie Dike, MD  Relative Name and Phone Number:       Current Level of Care: Hospital Recommended Level of Care: [place] Prior Approval Number:    Date Approved/Denied:   PASRR Number: [id number]  Discharge Plan: SNF    Current Diagnoses: Patient Active Problem List   Diagnosis Date Noted  . Palliative care encounter   . Mass of colon 11/04/2019  . Schizo affective schizophrenia ([place])   . 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 ([place]) 10/30/2019  . Primary adrenocortical insufficiency ([place]) 10/30/2019  . Anemia 10/30/2019  . Gastrointestinal hemorrhage   . Chronic obstructive pulmonary disease ([place])   . COVID-19 virus infection 09/11/2019  . Normocytic anemia 08/25/2019  . Community acquired pneumonia   . DNR (do not resuscitate) discussion   . SOB (shortness of breath) 07/16/2019  . Obesity, Class III, BMI 40-49.9 (morbid obesity) ([place])   . Schizophrenia ([place])   . Palliative care by specialist   . Advance care planning   . Goals of care, counseling/discussion   . CHF (congestive heart failure) ([place])   . Altered mental status   . Septic shock ([place]) 08/02/2017  . Pneumonia 06/21/2017  . Thrombocytopenia ([place]) 06/21/2017  . Leukopenia 06/21/2017  . Pressure injury of skin 06/21/2017    Orientation RESPIRATION BLADDER Height & Weight     Self  O2(2L [place]) Incontinent, External catheter Weight: (!) 157.1 kg Height:  6\' 2"  (188 cm)  BEHAVIORAL SYMPTOMS/MOOD NEUROLOGICAL BOWEL NUTRITION STATUS      Incontinent Diet(Heart healthy)  AMBULATORY STATUS COMMUNICATION OF NEEDS Skin   Total Care Verbally Other (Comment)(skin  tear)                       Personal Care Assistance Level of Assistance  Total care           Functional Limitations Info             SPECIAL CARE FACTORS FREQUENCY                       Contractures Contractures Info: Not present    Additional Factors Info  Code Status, Allergies Code Status Info: DNR Allergies Info: Methadone, Penicillins, Propoxyphene, Sulfa Antibiotics, Valproic Acid And Related           Current Medications (11/07/2019):  This is the current hospital active medication list Current Facility-Administered Medications  Medication Dose Route Frequency Provider Last Rate Last Admin  . 0.9 %  sodium chloride infusion (Manually program via Guardrails IV Fluids)   Intravenous Once Kathie Dike, MD      . 0.9 %  sodium chloride infusion   Intravenous Continuous Jonathon Bellows, MD   New Bag at 11/04/19 1048  . acetaminophen (TYLENOL) tablet 650 mg  650 mg Oral Q6H PRN Lucilla Lame, MD   650 mg at 11/06/19 [id number]   Or  . acetaminophen (TYLENOL) suppository 650 mg  650 mg Rectal Q6H PRN Lucilla Lame, MD      . albuterol (PROVENTIL) (2.5 MG/3ML) 0.083% nebulizer solution 2.5 mg  2.5 mg Inhalation Q3H PRN Lucilla Lame, MD      .  benztropine (COGENTIN) tablet 0.5 mg  0.5 mg Oral BID Lucilla Lame, MD   0.5 mg at 11/06/19 2316  . docusate sodium (COLACE) capsule 100 mg  100 mg Oral BID Lucilla Lame, MD   100 mg at 11/06/19 2316  . ferrous sulfate tablet 325 mg  325 mg Oral BID WC Lucilla Lame, MD   325 mg at 11/06/19 1641  . fluPHENAZine (PROLIXIN) tablet 15 mg  15 mg Oral QHS Lucilla Lame, MD   15 mg at 11/06/19 2317  . fluPHENAZine (PROLIXIN) tablet 15 mg  15 mg Oral Daily Lucilla Lame, MD   15 mg at 11/06/19 0830  . guaiFENesin (ROBITUSSIN) 100 MG/5ML solution 100 mg  100 mg Oral Q4H PRN Lucilla Lame, MD      . hydrocortisone (CORTEF) tablet 10 mg  10 mg Oral BID Lucilla Lame, MD   10 mg at 11/06/19 2315  . hydroxychloroquine (PLAQUENIL) tablet 200 mg   200 mg Oral BID Lucilla Lame, MD   200 mg at 11/06/19 2316  . iohexol (OMNIPAQUE) 9 MG/ML oral solution 500 mL  500 mL Oral Q1H Memon, Jolaine Artist, MD      . levothyroxine (SYNTHROID) tablet 200 mcg  200 mcg Oral Q0600 Kathie Dike, MD      . LORazepam (ATIVAN) tablet 2 mg  2 mg Oral Q6H PRN Lucilla Lame, MD      . metoprolol tartrate (LOPRESSOR) tablet 50 mg  50 mg Oral BID Lucilla Lame, MD   50 mg at 11/06/19 2316  . multivitamin with minerals tablet 1 tablet  1 tablet Oral Daily Lucilla Lame, MD   1 tablet at 11/06/19 0831  . ondansetron (ZOFRAN) tablet 4 mg  4 mg Oral Q6H PRN Lucilla Lame, MD       Or  . ondansetron (ZOFRAN) injection 4 mg  4 mg Intravenous Q6H PRN Lucilla Lame, MD      . pantoprazole (PROTONIX) EC tablet 40 mg  40 mg Oral Daily Lucilla Lame, MD   40 mg at 11/06/19 0829  . polyethylene glycol (MIRALAX / GLYCOLAX) packet 17 g  17 g Oral Daily PRN Lucilla Lame, MD      . polyethylene glycol (MIRALAX / GLYCOLAX) packet 17 g  17 g Oral Daily Lucilla Lame, MD   17 g at 11/06/19 [id number]  . senna (SENOKOT) tablet 8.6 mg  1 tablet Oral BID Lucilla Lame, MD   8.6 mg at 11/06/19 2316  . sodium chloride flush (NS) 0.9 % injection 10-40 mL  10-40 mL Intracatheter Q12H Fritzi Mandes, MD   10 mL at 11/07/19 1230  . sodium chloride flush (NS) 0.9 % injection 10-40 mL  10-40 mL Intracatheter PRN Fritzi Mandes, MD      . tamsulosin [facility]) capsule 0.4 mg  0.4 mg Oral Daily Lucilla Lame, MD   0.4 mg at 11/06/19 [id number]  . traMADol (ULTRAM) tablet 50 mg  50 mg Oral Q6H PRN Lucilla Lame, MD   50 mg at 11/06/19 2331  . vitamin C (ASCORBIC ACID) tablet 500 mg  500 mg Oral BID Lucilla Lame, MD   500 mg at 11/06/19 2315     Discharge Medications: Please see discharge summary for a list of discharge medications.  Relevant Imaging Results:  Relevant Lab Results:   Additional Information SSN: [id number]  Beverly Sessions, RN

## 2019-11-07 NOTE — Progress Notes (Signed)
[facility] [place] NAME: Jerry [name]    MR#:  [id number]  DATE OF BIRTH:  June 24, 1956  SUBJECTIVE:  Confused, cannot provide history REVIEW OF SYSTEMS:   Review of Systems  Unable to perform ROS: Psychiatric disorder   Tolerating Diet:heart healthy/carb mod Tolerating PT: does not walk, bedbound at baseline  DRUG ALLERGIES:   Allergies  Allergen Reactions  . Methadone   . Penicillins     Tolerates cephalosporins   . Propoxyphene   . Sulfa Antibiotics   . Valproic Acid And Related     Thrombocytopenia    VITALS:  Blood pressure 138/89, pulse 83, temperature 99 F (37.2 C), temperature source Oral, resp. rate 20, height 6\' 2"  (1.88 m), weight (!) 157.1 kg, SpO2 100 %.  PHYSICAL EXAMINATION:   Physical Exam  General exam: Alert, awake, confused Respiratory system: Clear to auscultation. Respiratory effort normal. Cardiovascular system:RRR. No murmurs, rubs, gallops. Gastrointestinal system: Abdomen is nondistended, soft and nontender. No organomegaly or masses felt. Normal bowel sounds heard. Central nervous system:  No focal neurological deficits. Extremities: Bilateral lower extremity edema Skin: No rashes, lesions or ulcers Psychiatry: Confused, paranoid     LABORATORY PANEL:  CBC Recent Labs  Lab 11/07/19 0618  WBC 5.1  HGB 7.1*  HCT 24.4*  PLT 272    Chemistries  Recent Labs  Lab 11/07/19 0618  NA 140  K 3.5  CL 100  CO2 32  GLUCOSE 106*  BUN 6*  CREATININE 0.38*  CALCIUM 8.2*   Cardiac Enzymes No results for input(s): TROPONINI in the last 168 hours. RADIOLOGY:  CT CHEST W CONTRAST  Result Date: 11/07/2019 CLINICAL DATA:  Staging for colorectal carcinoma. Colon mass seen on colonoscopy on 11/02/2019. EXAM: CT CHEST, ABDOMEN, AND PELVIS WITH CONTRAST TECHNIQUE: Multidetector CT imaging of the chest, abdomen and pelvis was performed following the standard protocol during bolus administration  of intravenous contrast. CONTRAST:  170mL OMNIPAQUE IOHEXOL 300 MG/ML  SOLN COMPARISON:  None. FINDINGS: CT CHEST FINDINGS Cardiovascular: Heart is normal size. No pericardial effusion. Three-vessel coronary artery calcifications. Great vessels are normal in caliber. Mild aortic atherosclerosis. Mediastinum/Nodes: No neck base, axillary, mediastinal or hilar masses or enlarged lymph nodes. Trachea and esophagus are unremarkable. Lungs/Pleura: No lung mass or nodule. Bilateral areas of bronchiectasis, irregular interstitial thickening and architectural distortion with honeycombing noted in the posterior inferior lower lobes and, to lesser degree, left upper lobe lingula. Findings consistent with interstitial fibrosis, which was present at the lung bases on the prior CT. No evidence of pneumonia or pulmonary edema. No pleural effusion or pneumothorax. Musculoskeletal: No fracture or acute finding. No osteoblastic or osteolytic lesions. CT ABDOMEN PELVIS FINDINGS Hepatobiliary: Somewhat limited due to artifact from the overlying left arm. Liver normal in size. No mass or focal lesion. Subtle dependent gallstones. No gallbladder wall thickening or evidence of acute cholecystitis. No bile duct dilation. Pancreas: Unremarkable. No pancreatic ductal dilatation or surrounding inflammatory changes. Spleen: Normal in size without focal abnormality. Adrenals/Urinary Tract: 14 mm left adrenal nodule, unchanged compared to the prior CT. Normal right adrenal gland. Kidneys normal in size, orientation and position with symmetric enhancement and excretion. No masses, stones or hydronephrosis. Normal ureters. Normal bladder. Stomach/Bowel: Colon is normal in caliber. The colon mass noted on the recent colonoscopy is not defined on CT. No wall thickening or inflammation. Stomach is unremarkable. Small bowel is normal in caliber with no wall thickening or inflammation. No evidence of appendicitis.  Appendix not definitively seen.  Vascular/Lymphatic: Mild aortic atherosclerosis. No aneurysm. No enlarged lymph nodes. Reproductive: Prostate not enlarged. Other: Small umbilical hernia. No ascites. There is subcutaneous soft tissue edema evident along right lateral thigh to right pelvis. Musculoskeletal: Chronic fracture of the left femoral neck with fragmentation of the femoral head, widening of the acetabula and acetabular protrusio. There are advanced right hip joint arthropathic changes. These findings are chronic. There are no acute fractures. No osteoblastic or osteolytic lesions. IMPRESSION: 1. No evidence of locally invasive colorectal carcinoma or of metastatic disease. 2. The colon mass noted on the recent colonoscopy is not visualized on CT. 3. No acute abnormalities within the chest, abdomen or pelvis. 4. Coronary artery calcifications and mild aortic atherosclerosis. 5. Lungs demonstrate changes of interstitial fibrosis. 6. Small dependent gallstones, but no evidence of acute cholecystitis. 7. Marked arthropathic changes the hips, also noted of the shoulders. Findings consistent with rheumatoid arthritis. Electronically Signed   By: Lajean Manes M.D.   On: 11/07/2019 20:00   CT ABDOMEN PELVIS W CONTRAST  Result Date: 11/07/2019 CLINICAL DATA:  Staging for colorectal carcinoma. Colon mass seen on colonoscopy on 11/02/2019. EXAM: CT CHEST, ABDOMEN, AND PELVIS WITH CONTRAST TECHNIQUE: Multidetector CT imaging of the chest, abdomen and pelvis was performed following the standard protocol during bolus administration of intravenous contrast. CONTRAST:  160mL OMNIPAQUE IOHEXOL 300 MG/ML  SOLN COMPARISON:  None. FINDINGS: CT CHEST FINDINGS Cardiovascular: Heart is normal size. No pericardial effusion. Three-vessel coronary artery calcifications. Great vessels are normal in caliber. Mild aortic atherosclerosis. Mediastinum/Nodes: No neck base, axillary, mediastinal or hilar masses or enlarged lymph nodes. Trachea and esophagus are  unremarkable. Lungs/Pleura: No lung mass or nodule. Bilateral areas of bronchiectasis, irregular interstitial thickening and architectural distortion with honeycombing noted in the posterior inferior lower lobes and, to lesser degree, left upper lobe lingula. Findings consistent with interstitial fibrosis, which was present at the lung bases on the prior CT. No evidence of pneumonia or pulmonary edema. No pleural effusion or pneumothorax. Musculoskeletal: No fracture or acute finding. No osteoblastic or osteolytic lesions. CT ABDOMEN PELVIS FINDINGS Hepatobiliary: Somewhat limited due to artifact from the overlying left arm. Liver normal in size. No mass or focal lesion. Subtle dependent gallstones. No gallbladder wall thickening or evidence of acute cholecystitis. No bile duct dilation. Pancreas: Unremarkable. No pancreatic ductal dilatation or surrounding inflammatory changes. Spleen: Normal in size without focal abnormality. Adrenals/Urinary Tract: 14 mm left adrenal nodule, unchanged compared to the prior CT. Normal right adrenal gland. Kidneys normal in size, orientation and position with symmetric enhancement and excretion. No masses, stones or hydronephrosis. Normal ureters. Normal bladder. Stomach/Bowel: Colon is normal in caliber. The colon mass noted on the recent colonoscopy is not defined on CT. No wall thickening or inflammation. Stomach is unremarkable. Small bowel is normal in caliber with no wall thickening or inflammation. No evidence of appendicitis. Appendix not definitively seen. Vascular/Lymphatic: Mild aortic atherosclerosis. No aneurysm. No enlarged lymph nodes. Reproductive: Prostate not enlarged. Other: Small umbilical hernia. No ascites. There is subcutaneous soft tissue edema evident along right lateral thigh to right pelvis. Musculoskeletal: Chronic fracture of the left femoral neck with fragmentation of the femoral head, widening of the acetabula and acetabular protrusio. There are  advanced right hip joint arthropathic changes. These findings are chronic. There are no acute fractures. No osteoblastic or osteolytic lesions. IMPRESSION: 1. No evidence of locally invasive colorectal carcinoma or of metastatic disease. 2. The colon mass noted on the recent colonoscopy is  not visualized on CT. 3. No acute abnormalities within the chest, abdomen or pelvis. 4. Coronary artery calcifications and mild aortic atherosclerosis. 5. Lungs demonstrate changes of interstitial fibrosis. 6. Small dependent gallstones, but no evidence of acute cholecystitis. 7. Marked arthropathic changes the hips, also noted of the shoulders. Findings consistent with rheumatoid arthritis. Electronically Signed   By: Lajean Manes M.D.   On: 11/07/2019 20:00   ASSESSMENT AND PLAN:  Jerry [name]  is a 63 y.o. 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  1.Acute on chronic anemia with heme positive stools and history of severe rheumatoid arthritis -there is a possibility patient could be having slow G.I. bleed -med-list does not show any NSAIDs or blood thinner -patient s/p one unit of blood transfusion -admission hemoglobin 6.8-- one unit blood transfusion--8.0--7.8-7.6-7.0 -transfuse for hemoglobin less than 7 -IV Protonix BID--change to po PPI -G.I. consultation with Dr. Mickie Hillier appreciated -EGD-- within normal limits -colonoscopy--An ulcerated non-obstructing large mass was found in the proximal       ascending colon. The mass was partially circumferential (involving       one-half of the lumen circumference). The mass measured one cm in       length. No bleeding was present -Biopsy of mass confirms invasive  adenocarcinoma -patient is followed closely for his anemia at the cancer center by Dr. Rogue Bussing -Dr. Lysle Pearl from surgery consulted.  Family is unsure whether they wish to proceed with surgical resection -Palliative care consultation has been obtained, appreciate assistance -Surgery will consider outpatient Port-A-Cath placement.  Case discussed with oncology and will order CT of the chest abdomen pelvis for staging. -Since hemoglobin is 7.1, will order 1 unit of PRBC  2. Chronic schizophrenia -continue his psych meds  3. COPD on chronic home oxygen -continue inhalers as needed  4. Chronic diastolic congestive heart failure EF 60% by echo in 2018. He is beginning to show some signs of volume overload. Will give one dose of IV lasix.  5. Chronic thrombocytopenia -continue to monitor  6. BPH on Flomax  7. Rheumatoid arthritis. On chronic hydroxychloroquine and chronic hydrocortisone.  At baseline patient is bed bound at baseline and is form a long term facility   Family Communication: patient's niece [name] on the phone  Consults: G.I, general surgery, oncology, palliative care. Code Status: DNR-- prior to admission. Confirmed with niece [name] who is HCPOA DVT prophylaxis:SCD due to TCP  TOTAL TIME TAKING CARE OF THIS PATIENT: *25* minutes.  >50% time spent on counselling and coordination of care  Probable discharge in a.m.  Kathie Dike M.D on 11/07/2019 at 8:10 PM  After 6pm go to [email or web address]  [facility]   CC: Primary care physician; [name], MDPatient ID: Jerry [name], male   DOB: Sep 20, 1956, 63 y.o.   MRN: [id number]

## 2019-11-08 DIAGNOSIS — E271 Primary adrenocortical insufficiency: Secondary | ICD-10-CM

## 2019-11-08 DIAGNOSIS — J9611 Chronic respiratory failure with hypoxia: Secondary | ICD-10-CM

## 2019-11-08 DIAGNOSIS — M069 Rheumatoid arthritis, unspecified: Secondary | ICD-10-CM

## 2019-11-08 DIAGNOSIS — C189 Malignant neoplasm of colon, unspecified: Secondary | ICD-10-CM

## 2019-11-08 DIAGNOSIS — I5032 Chronic diastolic (congestive) heart failure: Secondary | ICD-10-CM | POA: Diagnosis present

## 2019-11-08 DIAGNOSIS — Z66 Do not resuscitate: Secondary | ICD-10-CM | POA: Diagnosis present

## 2019-11-08 LAB — HEMOGLOBIN AND HEMATOCRIT, BLOOD
HCT: 27.3 % — ABNORMAL LOW (ref 39.0–52.0)
Hemoglobin: 8.1 g/dL — ABNORMAL LOW (ref 13.0–17.0)

## 2019-11-08 MED ORDER — PANTOPRAZOLE SODIUM 40 MG PO TBEC: 40.0000 mg | DELAYED_RELEASE_TABLET | Freq: Every day | ORAL | Status: DC

## 2019-11-08 MED ORDER — FERROUS SULFATE 325 (65 FE) MG PO TABS: 325.0000 mg | ORAL_TABLET | Freq: Two times a day (BID) | ORAL | 3 refills | Status: DC

## 2019-11-08 NOTE — Care Management (Signed)
[facility] cannot take patient until current Covid test (last test on 12/7). MD updated

## 2019-11-08 NOTE — Discharge Summary (Signed)
Physician Discharge Summary  Jerry [name] [id number] DOB: 1956/09/05 DOA: 10/30/2019  PCP: [name], MD  Admit date: 10/30/2019 Discharge date: 11/08/2019  Admitted From: SNF Disposition:  SNF  Recommendations for Outpatient Follow-up:  1. Follow up with PCP in 1-2 weeks 2. Please obtain BMP/CBC in one week 3. Patient will follow up with General surgery for port placement 4. Follow up with oncology to discuss further management of colon cancer 5. Patient would benefit from palliative care services at SNF  Discharge Condition:stable CODE STATUS: DNR Diet recommendation: heart healthy  Brief/Interim Summary: 63 year old male with a known history of morbid obesity, severe rheumatoid arthritis on Plaquenil with multiple contractures, chronic diastolic heart failure, COPD, chronic respiratory failure, chronic anemia, was brought to the emergency room from the cancer center when he was found to have a hemoglobin of 6.8.  Discharge Diagnoses:  Active Problems:   Obesity, Class III, BMI 40-49.9 (morbid obesity) (HCC)   Primary adrenocortical insufficiency (HCC)   Anemia   Chronic obstructive pulmonary disease (HCC)   Schizo affective schizophrenia (HCC)   Mass of colon   Palliative care encounter   Colon adenocarcinoma ([place])   DNR (do not resuscitate)   Chronic diastolic CHF (congestive heart failure) (HCC)   Rheumatoid arthritis (HCC)   Chronic respiratory failure with hypoxia ([place])  1. Acute on chronic anemia with heme positive stools.  Patient was seen by gastroenterology.  He underwent EGD that was found to be within normal limits.  Colonoscopy was also performed that showed proximal ascending colon mass.  Biopsies were performed that confirmed adenocarcinoma.  He was seen by general surgery regarding surgical resection.  Family is currently unsure whether they wish to proceed with invasive measures like this.  Surgery will see the patient for Port-A-Cath placement since he  has very poor venous access.  His new cancer diagnosis was also discussed with his primary oncologist who will follow the patient up in the outpatient setting.  Further discussions around treatment and palliative care will be discussed with family in the outpatient setting.  Staging CTs were performed that did not show any other evidence of disease in the abdomen or chest.  Patient was transfused a total of 2 units of PRBC during his hospital stay.  Follow-up hemoglobin has been stable.  He is not on any evidence of gross bleeding. 2. Chronic schizophrenia.  Continue on chronic psych meds. 3. [name].  Currently breathing comfortably on 2 L.  No evidence of wheezing. 4. Chronic diastolic heart failure.  Appears euvolemic at this time. 5. Rheumatoid arthritis.  Continue Plaquenil 6. Chronic adrenal insufficiency.  Continue on steroid supplementation.  Discharge Instructions  Discharge Instructions    Diet - low sodium heart healthy   Complete by: As directed    Increase activity slowly   Complete by: As directed      Allergies as of 11/08/2019      Reactions   Methadone    Penicillins    Tolerates cephalosporins   Propoxyphene    Sulfa Antibiotics    Valproic Acid And Related    Thrombocytopenia      Medication List    TAKE these medications   acetaminophen 325 MG tablet Commonly known as: TYLENOL Take 650 mg by mouth every 4 (four) hours as needed for mild pain or moderate pain.   albuterol 108 (90 Base) MCG/ACT inhaler Commonly known as: VENTOLIN HFA Inhale 1 puff into the lungs every 3 (three) hours as needed for wheezing  or shortness of breath.   ammonium lactate 12 % lotion Commonly known as: LAC-HYDRIN Apply 1 application topically 2 (two) times daily. Apply topically to bilateral feet   benztropine 0.5 MG tablet Commonly known as: COGENTIN Take 0.5 mg by mouth 2 (two) times daily.   docusate sodium 100 MG capsule Commonly known as: COLACE Take 100 mg  by mouth 2 (two) times daily.   feeding supplement (PRO-STAT SUGAR FREE 64) Liqd Take 30 mLs by mouth 3 (three) times daily with meals. Sugar free   ferrous sulfate 325 (65 FE) MG tablet Take 1 tablet (325 mg total) by mouth 2 (two) times daily with a meal.   fluPHENAZine 5 MG tablet Commonly known as: PROLIXIN Take 3 tablets (15 mg total) by mouth at bedtime.   fluPHENAZine 5 MG tablet Commonly known as: PROLIXIN Take 3 tablets (15 mg total) by mouth daily.   guaifenesin 100 MG/5ML syrup Commonly known as: ROBITUSSIN Take 100 mg by mouth every 4 (four) hours as needed for cough.   hydrocortisone 10 MG tablet Commonly known as: CORTEF Take 1 tablet (10 mg total) by mouth 2 (two) times daily.   hydroxychloroquine 200 MG tablet Commonly known as: PLAQUENIL Take 200 mg by mouth 2 (two) times daily.   levothyroxine 200 MCG tablet Commonly known as: SYNTHROID Take 200 mcg by mouth daily before breakfast.   LORazepam 2 MG tablet Commonly known as: ATIVAN Take 2 mg by mouth every 6 (six) hours as needed for anxiety.   methocarbamol 750 MG tablet Commonly known as: ROBAXIN Take 750 mg by mouth at bedtime.   metoprolol tartrate 50 MG tablet Commonly known as: LOPRESSOR Take 1 tablet (50 mg total) by mouth 2 (two) times daily.   MIRALAX PO Take 17 g by mouth daily.   multivitamin with minerals tablet Take 1 tablet by mouth daily.   pantoprazole 40 MG tablet Commonly known as: PROTONIX Take 1 tablet (40 mg total) by mouth daily. Start taking on: November 09, 2019   tamsulosin 0.4 MG Caps capsule Commonly known as: FLOMAX Take 0.4 mg by mouth daily.   traMADol 50 MG tablet Commonly known as: ULTRAM Take 50 mg by mouth every 6 (six) hours as needed for moderate pain or severe pain.   vitamin C 500 MG tablet Commonly known as: ASCORBIC ACID Take 500 mg by mouth 2 (two) times daily.       Allergies  Allergen Reactions  . Methadone   . Penicillins      Tolerates cephalosporins   . Propoxyphene   . Sulfa Antibiotics   . Valproic Acid And Related     Thrombocytopenia    Consultations:  General surgery  Gastroenterology   Procedures/Studies: CT CHEST W CONTRAST  Result Date: 11/07/2019 CLINICAL DATA:  Staging for colorectal carcinoma. Colon mass seen on colonoscopy on 11/02/2019. EXAM: CT CHEST, ABDOMEN, AND PELVIS WITH CONTRAST TECHNIQUE: Multidetector CT imaging of the chest, abdomen and pelvis was performed following the standard protocol during bolus administration of intravenous contrast. CONTRAST:  121mL OMNIPAQUE IOHEXOL 300 MG/ML  SOLN COMPARISON:  None. FINDINGS: CT CHEST FINDINGS Cardiovascular: Heart is normal size. No pericardial effusion. Three-vessel coronary artery calcifications. Great vessels are normal in caliber. Mild aortic atherosclerosis. Mediastinum/Nodes: No neck base, axillary, mediastinal or hilar masses or enlarged lymph nodes. Trachea and esophagus are unremarkable. Lungs/Pleura: No lung mass or nodule. Bilateral areas of bronchiectasis, irregular interstitial thickening and architectural distortion with honeycombing noted in the posterior inferior lower lobes and,  to lesser degree, left upper lobe lingula. Findings consistent with interstitial fibrosis, which was present at the lung bases on the prior CT. No evidence of pneumonia or pulmonary edema. No pleural effusion or pneumothorax. Musculoskeletal: No fracture or acute finding. No osteoblastic or osteolytic lesions. CT ABDOMEN PELVIS FINDINGS Hepatobiliary: Somewhat limited due to artifact from the overlying left arm. Liver normal in size. No mass or focal lesion. Subtle dependent gallstones. No gallbladder wall thickening or evidence of acute cholecystitis. No bile duct dilation. Pancreas: Unremarkable. No pancreatic ductal dilatation or surrounding inflammatory changes. Spleen: Normal in size without focal abnormality. Adrenals/Urinary Tract: 14 mm left adrenal  nodule, unchanged compared to the prior CT. Normal right adrenal gland. Kidneys normal in size, orientation and position with symmetric enhancement and excretion. No masses, stones or hydronephrosis. Normal ureters. Normal bladder. Stomach/Bowel: Colon is normal in caliber. The colon mass noted on the recent colonoscopy is not defined on CT. No wall thickening or inflammation. Stomach is unremarkable. Small bowel is normal in caliber with no wall thickening or inflammation. No evidence of appendicitis. Appendix not definitively seen. Vascular/Lymphatic: Mild aortic atherosclerosis. No aneurysm. No enlarged lymph nodes. Reproductive: Prostate not enlarged. Other: Small umbilical hernia. No ascites. There is subcutaneous soft tissue edema evident along right lateral thigh to right pelvis. Musculoskeletal: Chronic fracture of the left femoral neck with fragmentation of the femoral head, widening of the acetabula and acetabular protrusio. There are advanced right hip joint arthropathic changes. These findings are chronic. There are no acute fractures. No osteoblastic or osteolytic lesions. IMPRESSION: 1. No evidence of locally invasive colorectal carcinoma or of metastatic disease. 2. The colon mass noted on the recent colonoscopy is not visualized on CT. 3. No acute abnormalities within the chest, abdomen or pelvis. 4. Coronary artery calcifications and mild aortic atherosclerosis. 5. Lungs demonstrate changes of interstitial fibrosis. 6. Small dependent gallstones, but no evidence of acute cholecystitis. 7. Marked arthropathic changes the hips, also noted of the shoulders. Findings consistent with rheumatoid arthritis. Electronically Signed   By: Lajean Manes M.D.   On: 11/07/2019 20:00   CT ABDOMEN PELVIS W CONTRAST  Result Date: 11/07/2019 CLINICAL DATA:  Staging for colorectal carcinoma. Colon mass seen on colonoscopy on 11/02/2019. EXAM: CT CHEST, ABDOMEN, AND PELVIS WITH CONTRAST TECHNIQUE: Multidetector CT  imaging of the chest, abdomen and pelvis was performed following the standard protocol during bolus administration of intravenous contrast. CONTRAST:  193mL OMNIPAQUE IOHEXOL 300 MG/ML  SOLN COMPARISON:  None. FINDINGS: CT CHEST FINDINGS Cardiovascular: Heart is normal size. No pericardial effusion. Three-vessel coronary artery calcifications. Great vessels are normal in caliber. Mild aortic atherosclerosis. Mediastinum/Nodes: No neck base, axillary, mediastinal or hilar masses or enlarged lymph nodes. Trachea and esophagus are unremarkable. Lungs/Pleura: No lung mass or nodule. Bilateral areas of bronchiectasis, irregular interstitial thickening and architectural distortion with honeycombing noted in the posterior inferior lower lobes and, to lesser degree, left upper lobe lingula. Findings consistent with interstitial fibrosis, which was present at the lung bases on the prior CT. No evidence of pneumonia or pulmonary edema. No pleural effusion or pneumothorax. Musculoskeletal: No fracture or acute finding. No osteoblastic or osteolytic lesions. CT ABDOMEN PELVIS FINDINGS Hepatobiliary: Somewhat limited due to artifact from the overlying left arm. Liver normal in size. No mass or focal lesion. Subtle dependent gallstones. No gallbladder wall thickening or evidence of acute cholecystitis. No bile duct dilation. Pancreas: Unremarkable. No pancreatic ductal dilatation or surrounding inflammatory changes. Spleen: Normal in size without focal abnormality. Adrenals/Urinary Tract:  14 mm left adrenal nodule, unchanged compared to the prior CT. Normal right adrenal gland. Kidneys normal in size, orientation and position with symmetric enhancement and excretion. No masses, stones or hydronephrosis. Normal ureters. Normal bladder. Stomach/Bowel: Colon is normal in caliber. The colon mass noted on the recent colonoscopy is not defined on CT. No wall thickening or inflammation. Stomach is unremarkable. Small bowel is normal in  caliber with no wall thickening or inflammation. No evidence of appendicitis. Appendix not definitively seen. Vascular/Lymphatic: Mild aortic atherosclerosis. No aneurysm. No enlarged lymph nodes. Reproductive: Prostate not enlarged. Other: Small umbilical hernia. No ascites. There is subcutaneous soft tissue edema evident along right lateral thigh to right pelvis. Musculoskeletal: Chronic fracture of the left femoral neck with fragmentation of the femoral head, widening of the acetabula and acetabular protrusio. There are advanced right hip joint arthropathic changes. These findings are chronic. There are no acute fractures. No osteoblastic or osteolytic lesions. IMPRESSION: 1. No evidence of locally invasive colorectal carcinoma or of metastatic disease. 2. The colon mass noted on the recent colonoscopy is not visualized on CT. 3. No acute abnormalities within the chest, abdomen or pelvis. 4. Coronary artery calcifications and mild aortic atherosclerosis. 5. Lungs demonstrate changes of interstitial fibrosis. 6. Small dependent gallstones, but no evidence of acute cholecystitis. 7. Marked arthropathic changes the hips, also noted of the shoulders. Findings consistent with rheumatoid arthritis. Electronically Signed   By: Lajean Manes M.D.   On: 11/07/2019 20:00      Subjective: Confused, no distress  Discharge Exam: Vitals:   11/07/19 2323 11/07/19 2349 11/08/19 0311 11/08/19 0634  BP: (!) 155/78 (!) 140/96 110/71 140/66  Pulse: 82 85 81 72  Resp: 20 19 20 20   Temp: 98.4 F (36.9 C) 98.3 F (36.8 C) 98.3 F (36.8 C) 98.3 F (36.8 C)  TempSrc: Oral Oral Oral Oral  SpO2: 100% 100% 100% 100%  Weight:      Height:        General: Pt is no distress Cardiovascular: RRR, S1/S2 +, no rubs, no gallops Respiratory: CTA bilaterally, no wheezing, no rhonchi Abdominal: Soft, NT, ND, bowel sounds + Extremities: no edema, no cyanosis    The results of significant diagnostics from this  hospitalization (including imaging, microbiology, ancillary and laboratory) are listed below for reference.     Microbiology: Recent Results (from the past 240 hour(s))  SARS CORONAVIRUS 2 (TAT 6-24 HRS) Nasopharyngeal Nasopharyngeal Swab     Status: None   Collection Time: 10/30/19  4:02 PM   Specimen: Nasopharyngeal Swab  Result Value Ref Range Status   SARS Coronavirus 2 NEGATIVE NEGATIVE Final    Comment: (NOTE) SARS-CoV-2 target nucleic acids are NOT DETECTED. The SARS-CoV-2 RNA is generally detectable in upper and lower respiratory specimens during the acute phase of infection. Negative results do not preclude SARS-CoV-2 infection, do not rule out co-infections with other pathogens, and should not be used as the sole basis for treatment or other patient management decisions. Negative results must be combined with clinical observations, patient history, and epidemiological information. The expected result is Negative. Fact Sheet for Patients: SugarRoll.be Fact Sheet for Healthcare Providers: https://www.woods-mathews.com/ This test is not yet approved or cleared by the [place] FDA and  has been authorized for detection and/or diagnosis of SARS-CoV-2 by FDA under an Emergency Use Authorization (EUA). This EUA will remain  in effect (meaning this test can be used) for the duration of the COVID-19 declaration under Section 56 4(b)(1) of the Act,  21 U.S.C. section 360bbb-3(b)(1), unless the authorization is terminated or revoked sooner. Performed at [facility], [place] [name] [place], [place] [address]   MRSA PCR Screening     Status: Abnormal   Collection Time: 10/31/19  6:53 PM   Specimen: Nasal Mucosa; Nasopharyngeal  Result Value Ref Range Status   MRSA by PCR POSITIVE (A) NEGATIVE Final    Comment:        The GeneXpert MRSA Assay (FDA approved for NASAL specimens only), is one component of a comprehensive MRSA  colonization surveillance program. It is not intended to diagnose MRSA infection nor to guide or monitor treatment for MRSA infections. RESULT CALLED TO, READ BACK BY AND VERIFIED WITH: DAWN SONGSTER 10/31/2019 2030 KMP Performed at [facility], [place], [place] [address]   SARS CORONAVIRUS 2 (TAT 6-24 HRS) Nasopharyngeal Nasopharyngeal Swab     Status: None   Collection Time: 11/03/19  1:32 PM   Specimen: Nasopharyngeal Swab  Result Value Ref Range Status   SARS Coronavirus 2 NEGATIVE NEGATIVE Final    Comment: (NOTE) SARS-CoV-2 target nucleic acids are NOT DETECTED. The SARS-CoV-2 RNA is generally detectable in upper and lower respiratory specimens during the acute phase of infection. Negative results do not preclude SARS-CoV-2 infection, do not rule out co-infections with other pathogens, and should not be used as the sole basis for treatment or other patient management decisions. Negative results must be combined with clinical observations, patient history, and epidemiological information. The expected result is Negative. Fact Sheet for Patients: SugarRoll.be Fact Sheet for Healthcare Providers: https://www.woods-mathews.com/ This test is not yet approved or cleared by the [place] FDA and  has been authorized for detection and/or diagnosis of SARS-CoV-2 by FDA under an Emergency Use Authorization (EUA). This EUA will remain  in effect (meaning this test can be used) for the duration of the COVID-19 declaration under Section 56 4(b)(1) of the Act, 21 U.S.C. section 360bbb-3(b)(1), unless the authorization is terminated or revoked sooner. Performed at [facility], [place] [address], [place], [place] [address]      Labs: BNP (last 3 results) Recent Labs    09/12/19 0000 09/13/19 0141 09/14/19 0123  BNP 650.2* 502.3* [id number]*   Basic Metabolic Panel: Recent Labs  Lab 11/07/19 0618  NA 140   K 3.5  CL 100  CO2 32  GLUCOSE 106*  BUN 6*  CREATININE 0.38*  CALCIUM 8.2*  PHOS 3.9   Liver Function Tests: Recent Labs  Lab 11/07/19 0618  ALBUMIN 2.3*   No results for input(s): LIPASE, AMYLASE in the last 168 hours. No results for input(s): AMMONIA in the last 168 hours. CBC: Recent Labs  Lab 11/02/19 0846 11/05/19 0428 11/06/19 1127 11/07/19 0618 11/08/19 0521  WBC 4.3 4.6 6.7 5.1  --   NEUTROABS  --  3.1  --   --   --   HGB 7.6* 7.2* 7.0* 7.1* 8.1*  HCT 25.1* 24.8* 24.6* 24.4* 27.3*  MCV 77.5* 78.7* 80.9 75.8*  --   PLT 255 271 169 272  --    Cardiac Enzymes: No results for input(s): CKTOTAL, CKMB, CKMBINDEX, TROPONINI in the last 168 hours. BNP: Invalid input(s): POCBNP CBG: No results for input(s): GLUCAP in the last 168 hours. D-Dimer No results for input(s): DDIMER in the last 72 hours. Hgb A1c No results for input(s): HGBA1C in the last 72 hours. Lipid Profile No results for input(s): CHOL, HDL, LDLCALC, TRIG, CHOLHDL, LDLDIRECT in the  last 72 hours. Thyroid function studies No results for input(s): TSH, T4TOTAL, T3FREE, THYROIDAB in the last 72 hours.  Invalid input(s): FREET3 Anemia work up No results for input(s): VITAMINB12, FOLATE, FERRITIN, TIBC, IRON, RETICCTPCT in the last 72 hours. Urinalysis    Component Value Date/Time   COLORURINE YELLOW (A) 12/25/2017 1601   APPEARANCEUR HAZY (A) 12/25/2017 1601   APPEARANCEUR Hazy 01/19/2013 1635   LABSPEC 1.018 12/25/2017 1601   LABSPEC 1.008 01/19/2013 1635   PHURINE 5.0 12/25/2017 1601   GLUCOSEU NEGATIVE 12/25/2017 1601   GLUCOSEU Negative 01/19/2013 1635   HGBUR NEGATIVE 12/25/2017 1601   BILIRUBINUR NEGATIVE 12/25/2017 1601   BILIRUBINUR Negative 01/19/2013 1635   KETONESUR NEGATIVE 12/25/2017 1601   PROTEINUR NEGATIVE 12/25/2017 1601   NITRITE POSITIVE (A) 12/25/2017 1601   LEUKOCYTESUR LARGE (A) 12/25/2017 1601   LEUKOCYTESUR Negative 01/19/2013 1635   Sepsis Labs Invalid  input(s): PROCALCITONIN,  WBC,  LACTICIDVEN Microbiology Recent Results (from the past 240 hour(s))  SARS CORONAVIRUS 2 (TAT 6-24 HRS) Nasopharyngeal Nasopharyngeal Swab     Status: None   Collection Time: 10/30/19  4:02 PM   Specimen: Nasopharyngeal Swab  Result Value Ref Range Status   SARS Coronavirus 2 NEGATIVE NEGATIVE Final    Comment: (NOTE) SARS-CoV-2 target nucleic acids are NOT DETECTED. The SARS-CoV-2 RNA is generally detectable in upper and lower respiratory specimens during the acute phase of infection. Negative results do not preclude SARS-CoV-2 infection, do not rule out co-infections with other pathogens, and should not be used as the sole basis for treatment or other patient management decisions. Negative results must be combined with clinical observations, patient history, and epidemiological information. The expected result is Negative. Fact Sheet for Patients: SugarRoll.be Fact Sheet for Healthcare Providers: https://www.woods-mathews.com/ This test is not yet approved or cleared by the [place] FDA and  has been authorized for detection and/or diagnosis of SARS-CoV-2 by FDA under an Emergency Use Authorization (EUA). This EUA will remain  in effect (meaning this test can be used) for the duration of the COVID-19 declaration under Section 56 4(b)(1) of the Act, 21 U.S.C. section 360bbb-3(b)(1), unless the authorization is terminated or revoked sooner. Performed at [facility], [place] [address], [place], [place] [address]   MRSA PCR Screening     Status: Abnormal   Collection Time: 10/31/19  6:53 PM   Specimen: Nasal Mucosa; Nasopharyngeal  Result Value Ref Range Status   MRSA by PCR POSITIVE (A) NEGATIVE Final    Comment:        The GeneXpert MRSA Assay (FDA approved for NASAL specimens only), is one component of a comprehensive MRSA colonization surveillance program. It is not intended to diagnose  MRSA infection nor to guide or monitor treatment for MRSA infections. RESULT CALLED TO, READ BACK BY AND VERIFIED WITH: DAWN SONGSTER 10/31/2019 2030 KMP Performed at [facility], [place], [place] [address]   SARS CORONAVIRUS 2 (TAT 6-24 HRS) Nasopharyngeal Nasopharyngeal Swab     Status: None   Collection Time: 11/03/19  1:32 PM   Specimen: Nasopharyngeal Swab  Result Value Ref Range Status   SARS Coronavirus 2 NEGATIVE NEGATIVE Final    Comment: (NOTE) SARS-CoV-2 target nucleic acids are NOT DETECTED. The SARS-CoV-2 RNA is generally detectable in upper and lower respiratory specimens during the acute phase of infection. Negative results do not preclude SARS-CoV-2 infection, do not rule out co-infections with other pathogens, and should not be used as the sole basis for treatment  or other patient management decisions. Negative results must be combined with clinical observations, patient history, and epidemiological information. The expected result is Negative. Fact Sheet for Patients: SugarRoll.be Fact Sheet for Healthcare Providers: https://www.woods-mathews.com/ This test is not yet approved or cleared by the [place] FDA and  has been authorized for detection and/or diagnosis of SARS-CoV-2 by FDA under an Emergency Use Authorization (EUA). This EUA will remain  in effect (meaning this test can be used) for the duration of the COVID-19 declaration under Section 56 4(b)(1) of the Act, 21 U.S.C. section 360bbb-3(b)(1), unless the authorization is terminated or revoked sooner. Performed at [facility], [place] [address], [place],  [address]      Time coordinating discharge: 39mins  SIGNED:   Kathie Dike, MD  [facility] 11/08/2019, 11:35 AM   If 7PM-7AM, please contact night-coverage [email or web address]

## 2019-11-09 LAB — SARS CORONAVIRUS 2 (TAT 6-24 HRS): SARS Coronavirus 2: NEGATIVE

## 2019-11-09 MED ORDER — HYDRALAZINE HCL 25 MG PO TABS
25.0000 mg | ORAL_TABLET | Freq: Four times a day (QID) | ORAL | Status: DC | PRN
  Administered 2019-11-09: 25 mg via ORAL
  Filled 2019-11-09: qty 1

## 2019-11-09 MED ORDER — POTASSIUM CHLORIDE CRYS ER 20 MEQ PO TBCR
40.0000 meq | EXTENDED_RELEASE_TABLET | ORAL | Status: AC
  Administered 2019-11-09 (×2): 40 meq via ORAL
  Filled 2019-11-09 (×2): qty 2

## 2019-11-09 MED ORDER — FUROSEMIDE 10 MG/ML IJ SOLN
40.0000 mg | Freq: Once | INTRAMUSCULAR | Status: AC
  Administered 2019-11-09: 40 mg via INTRAVENOUS
  Filled 2019-11-09: qty 4

## 2019-11-09 NOTE — Progress Notes (Signed)
[facility] NAME: Jerry [name]    MR#:  [id number]  DATE OF BIRTH:  08/18/1956  SUBJECTIVE:  Confused, denies any shortness of breath REVIEW OF SYSTEMS:   Review of Systems  Unable to perform ROS: Psychiatric disorder   Tolerating Diet:heart healthy/carb mod Tolerating PT: does not walk, bedbound at baseline  DRUG ALLERGIES:   Allergies  Allergen Reactions  . Methadone   . Penicillins     Tolerates cephalosporins   . Propoxyphene   . Sulfa Antibiotics   . Valproic Acid And Related     Thrombocytopenia    VITALS:  Blood pressure (!) 170/99, pulse 75, temperature (!) 89.9 F (32.2 C), temperature source Oral, resp. rate 20, height 6\' 2"  (1.88 m), weight (!) 157.1 kg, SpO2 100 %.  PHYSICAL EXAMINATION:   Physical Exam  General exam: Alert, awake, no distress Respiratory system: Clear to auscultation. Respiratory effort normal. Cardiovascular system:RRR. No murmurs, rubs, gallops.  LABORATORY PANEL:  CBC Recent Labs  Lab 11/07/19 0618 11/08/19 0521  WBC 5.1  --   HGB 7.1* 8.1*  HCT 24.4* 27.3*  PLT 272  --     Chemistries  Recent Labs  Lab 11/07/19 0618  NA 140  K 3.5  CL 100  CO2 32  GLUCOSE 106*  BUN 6*  CREATININE 0.38*  CALCIUM 8.2*   Cardiac Enzymes No results for input(s): TROPONINI in the last 168 hours. RADIOLOGY:  CT CHEST W CONTRAST  Result Date: 11/07/2019 CLINICAL DATA:  Staging for colorectal carcinoma. Colon mass seen on colonoscopy on 11/02/2019. EXAM: CT CHEST, ABDOMEN, AND PELVIS WITH CONTRAST TECHNIQUE: Multidetector CT imaging of the chest, abdomen and pelvis was performed following the standard protocol during bolus administration of intravenous contrast. CONTRAST:  180mL OMNIPAQUE IOHEXOL 300 MG/ML  SOLN COMPARISON:  None. FINDINGS: CT CHEST FINDINGS Cardiovascular: Heart is normal size. No pericardial effusion. Three-vessel coronary artery calcifications. Great vessels are  normal in caliber. Mild aortic atherosclerosis. Mediastinum/Nodes: No neck base, axillary, mediastinal or hilar masses or enlarged lymph nodes. Trachea and esophagus are unremarkable. Lungs/Pleura: No lung mass or nodule. Bilateral areas of bronchiectasis, irregular interstitial thickening and architectural distortion with honeycombing noted in the posterior inferior lower lobes and, to lesser degree, left upper lobe lingula. Findings consistent with interstitial fibrosis, which was present at the lung bases on the prior CT. No evidence of pneumonia or pulmonary edema. No pleural effusion or pneumothorax. Musculoskeletal: No fracture or acute finding. No osteoblastic or osteolytic lesions. CT ABDOMEN PELVIS FINDINGS Hepatobiliary: Somewhat limited due to artifact from the overlying left arm. Liver normal in size. No mass or focal lesion. Subtle dependent gallstones. No gallbladder wall thickening or evidence of acute cholecystitis. No bile duct dilation. Pancreas: Unremarkable. No pancreatic ductal dilatation or surrounding inflammatory changes. Spleen: Normal in size without focal abnormality. Adrenals/Urinary Tract: 14 mm left adrenal nodule, unchanged compared to the prior CT. Normal right adrenal gland. Kidneys normal in size, orientation and position with symmetric enhancement and excretion. No masses, stones or hydronephrosis. Normal ureters. Normal bladder. Stomach/Bowel: Colon is normal in caliber. The colon mass noted on the recent colonoscopy is not defined on CT. No wall thickening or inflammation. Stomach is unremarkable. Small bowel is normal in caliber with no wall thickening or inflammation. No evidence of appendicitis. Appendix not definitively seen. Vascular/Lymphatic: Mild aortic atherosclerosis. No aneurysm. No enlarged lymph nodes. Reproductive: Prostate not enlarged. Other: Small umbilical hernia. No ascites. There is subcutaneous  soft tissue edema evident along right lateral thigh to right  pelvis. Musculoskeletal: Chronic fracture of the left femoral neck with fragmentation of the femoral head, widening of the acetabula and acetabular protrusio. There are advanced right hip joint arthropathic changes. These findings are chronic. There are no acute fractures. No osteoblastic or osteolytic lesions. IMPRESSION: 1. No evidence of locally invasive colorectal carcinoma or of metastatic disease. 2. The colon mass noted on the recent colonoscopy is not visualized on CT. 3. No acute abnormalities within the chest, abdomen or pelvis. 4. Coronary artery calcifications and mild aortic atherosclerosis. 5. Lungs demonstrate changes of interstitial fibrosis. 6. Small dependent gallstones, but no evidence of acute cholecystitis. 7. Marked arthropathic changes the hips, also noted of the shoulders. Findings consistent with rheumatoid arthritis. Electronically Signed   By: Lajean Manes M.D.   On: 11/07/2019 20:00   CT ABDOMEN PELVIS W CONTRAST  Result Date: 11/07/2019 CLINICAL DATA:  Staging for colorectal carcinoma. Colon mass seen on colonoscopy on 11/02/2019. EXAM: CT CHEST, ABDOMEN, AND PELVIS WITH CONTRAST TECHNIQUE: Multidetector CT imaging of the chest, abdomen and pelvis was performed following the standard protocol during bolus administration of intravenous contrast. CONTRAST:  153mL OMNIPAQUE IOHEXOL 300 MG/ML  SOLN COMPARISON:  None. FINDINGS: CT CHEST FINDINGS Cardiovascular: Heart is normal size. No pericardial effusion. Three-vessel coronary artery calcifications. Great vessels are normal in caliber. Mild aortic atherosclerosis. Mediastinum/Nodes: No neck base, axillary, mediastinal or hilar masses or enlarged lymph nodes. Trachea and esophagus are unremarkable. Lungs/Pleura: No lung mass or nodule. Bilateral areas of bronchiectasis, irregular interstitial thickening and architectural distortion with honeycombing noted in the posterior inferior lower lobes and, to lesser degree, left upper lobe  lingula. Findings consistent with interstitial fibrosis, which was present at the lung bases on the prior CT. No evidence of pneumonia or pulmonary edema. No pleural effusion or pneumothorax. Musculoskeletal: No fracture or acute finding. No osteoblastic or osteolytic lesions. CT ABDOMEN PELVIS FINDINGS Hepatobiliary: Somewhat limited due to artifact from the overlying left arm. Liver normal in size. No mass or focal lesion. Subtle dependent gallstones. No gallbladder wall thickening or evidence of acute cholecystitis. No bile duct dilation. Pancreas: Unremarkable. No pancreatic ductal dilatation or surrounding inflammatory changes. Spleen: Normal in size without focal abnormality. Adrenals/Urinary Tract: 14 mm left adrenal nodule, unchanged compared to the prior CT. Normal right adrenal gland. Kidneys normal in size, orientation and position with symmetric enhancement and excretion. No masses, stones or hydronephrosis. Normal ureters. Normal bladder. Stomach/Bowel: Colon is normal in caliber. The colon mass noted on the recent colonoscopy is not defined on CT. No wall thickening or inflammation. Stomach is unremarkable. Small bowel is normal in caliber with no wall thickening or inflammation. No evidence of appendicitis. Appendix not definitively seen. Vascular/Lymphatic: Mild aortic atherosclerosis. No aneurysm. No enlarged lymph nodes. Reproductive: Prostate not enlarged. Other: Small umbilical hernia. No ascites. There is subcutaneous soft tissue edema evident along right lateral thigh to right pelvis. Musculoskeletal: Chronic fracture of the left femoral neck with fragmentation of the femoral head, widening of the acetabula and acetabular protrusio. There are advanced right hip joint arthropathic changes. These findings are chronic. There are no acute fractures. No osteoblastic or osteolytic lesions. IMPRESSION: 1. No evidence of locally invasive colorectal carcinoma or of metastatic disease. 2. The colon mass  noted on the recent colonoscopy is not visualized on CT. 3. No acute abnormalities within the chest, abdomen or pelvis. 4. Coronary artery calcifications and mild aortic atherosclerosis. 5. Lungs demonstrate changes of  interstitial fibrosis. 6. Small dependent gallstones, but no evidence of acute cholecystitis. 7. Marked arthropathic changes the hips, also noted of the shoulders. Findings consistent with rheumatoid arthritis. Electronically Signed   By: Lajean Manes M.D.   On: 11/07/2019 20:00   ASSESSMENT AND PLAN:  Jerry [name]  is a 63 y.o. 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  1.Acute on chronic anemia with heme positive stools and history of severe rheumatoid arthritis -there is a possibility patient could be having slow G.I. bleed -med-list does not show any NSAIDs or blood thinner -patient s/p one unit of blood transfusion -admission hemoglobin 6.8-- one unit blood transfusion--8.0--7.8-7.6-7.0 -transfuse for hemoglobin less than 7 -IV Protonix BID--change to po PPI -G.I. consultation with Dr. Mickie Hillier appreciated -EGD-- within normal limits -colonoscopy--An ulcerated non-obstructing large mass was found in the proximal       ascending colon. The mass was partially circumferential (involving       one-half of the lumen circumference). The mass measured one cm in       length. No bleeding was present -Biopsy of mass confirms invasive adenocarcinoma -patient is followed closely for his anemia at the cancer center by Dr. Rogue Bussing -Dr. Lysle Pearl from surgery consulted.  Family is unsure whether they wish to proceed with surgical resection -Palliative care consultation has been obtained, appreciate  assistance -Surgery will consider outpatient Port-A-Cath placement.  Case discussed with oncology and will order CT of the chest abdomen pelvis for staging. -Since hemoglobin is 7.1, will order 1 unit of PRBC  2. Chronic schizophrenia -continue his psych meds  3. COPD on chronic home oxygen -continue inhalers as needed  4. Chronic diastolic congestive heart failure EF 60% by echo in 2018. He is beginning to show some signs of volume overload. Will give one dose of IV lasix.  5. Chronic thrombocytopenia -continue to monitor  6. BPH on Flomax  7. Rheumatoid arthritis. On chronic hydroxychloroquine and chronic hydrocortisone.  At baseline patient is bed bound at baseline and is form a long term facility   Family Communication: patient's niece [name] on the phone  Consults: G.I, general surgery, oncology, palliative care. Code Status: DNR-- prior to admission. Confirmed with niece [name] who is HCPOA DVT prophylaxis:SCD due to TCP  TOTAL TIME TAKING CARE OF THIS PATIENT: *25* minutes.  >50% time spent on counselling and coordination of care  Probable discharge in a.m. Patient's discharge is pending negative [phone] test which has currently been processing >24h. I have discussed with lab and it will take another 4-6 hours for test to process, which will be too late for patient to return to SNF today. Will likely plan for discharge in AM  Kathie Dike M.D on 11/09/2019 at 4:04 PM  After 6pm go to [email or web address]  [facility]   CC: Primary care physician; [name], MDPatient ID: Jerry [name], male   DOB: November 13, 1956, 63 y.o.   MRN: [id number]

## 2019-11-10 ENCOUNTER — Inpatient Hospital Stay: Payer: Medicaid Other

## 2019-11-10 LAB — BASIC METABOLIC PANEL
Anion gap: 11 (ref 5–15)
BUN: 11 mg/dL (ref 8–23)
CO2: 24 mmol/L (ref 22–32)
Calcium: 8.7 mg/dL — ABNORMAL LOW (ref 8.9–10.3)
Chloride: 100 mmol/L (ref 98–111)
Creatinine, Ser: 0.54 mg/dL — ABNORMAL LOW (ref 0.61–1.24)
GFR calc Af Amer: >60 mL/min (ref >60)
GFR calc non Af Amer: >60 mL/min (ref >60)
Glucose, Bld: 128 mg/dL — ABNORMAL HIGH (ref 70–99)
Potassium: 4.7 mmol/L (ref 3.5–5.1)
Sodium: 135 mmol/L (ref 135–145)

## 2019-11-10 LAB — TYPE AND SCREEN
ABO/RH(D): B POS
Antibody Screen: NEGATIVE
Unit division: 0

## 2019-11-10 LAB — BPAM RBC
Blood Product Expiration Date: 202101132359
ISSUE DATE / TIME: 202012112328
Unit Type and Rh: 7300

## 2019-11-10 MED ORDER — FUROSEMIDE 20 MG PO TABS: 20.0000 mg | ORAL_TABLET | Freq: Every day | ORAL | 11 refills | Status: DC

## 2019-11-10 MED ORDER — IPRATROPIUM-ALBUTEROL 0.5-2.5 (3) MG/3ML IN SOLN
3.0000 mL | Freq: Four times a day (QID) | RESPIRATORY_TRACT | Status: DC
  Administered 2019-11-10 – 2019-11-20 (×37): 3 mL via RESPIRATORY_TRACT
  Filled 2019-11-10 (×39): qty 3

## 2019-11-10 MED ORDER — LEVOFLOXACIN IN D5W 500 MG/100ML IV SOLN
500.0000 mg | INTRAVENOUS | Status: DC
  Administered 2019-11-10 – 2019-11-11 (×2): 500 mg via INTRAVENOUS
  Filled 2019-11-10 (×3): qty 100

## 2019-11-10 MED ORDER — POTASSIUM CHLORIDE ER 20 MEQ PO TBCR: 10.0000 meq | EXTENDED_RELEASE_TABLET | Freq: Every day | ORAL | Status: DC

## 2019-11-10 MED ORDER — GUAIFENESIN 100 MG/5ML PO SOLN
15.0000 mL | Freq: Three times a day (TID) | ORAL | Status: DC
  Administered 2019-11-10 – 2019-11-11 (×4): 300 mg via ORAL
  Filled 2019-11-10 (×9): qty 15

## 2019-11-10 MED ORDER — BUDESONIDE 0.25 MG/2ML IN SUSP
0.2500 mg | Freq: Two times a day (BID) | RESPIRATORY_TRACT | Status: DC
  Administered 2019-11-10 – 2019-11-20 (×19): 0.25 mg via RESPIRATORY_TRACT
  Filled 2019-11-10 (×20): qty 2

## 2019-11-10 NOTE — Discharge Summary (Signed)
Physician Discharge Summary  Jerry [name] [id number] DOB: 08/28/1956 DOA: 10/30/2019  PCP: [name], MD  Admit date: 10/30/2019 Discharge date: 11/10/2019  Admitted From: SNF Disposition:  SNF  Recommendations for Outpatient Follow-up:  1. Follow up with PCP in 1-2 weeks 2. Please obtain BMP/CBC in one week 3. Patient will follow up with General surgery for port placement 4. Follow up with oncology to discuss further management of colon cancer 5. Patient would benefit from palliative care services at SNF  Discharge Condition:stable CODE STATUS: DNR Diet recommendation: heart healthy  Brief/Interim Summary: 63 year old male with a known history of morbid obesity, severe rheumatoid arthritis on Plaquenil with multiple contractures, chronic diastolic heart failure, COPD, chronic respiratory failure, chronic anemia, was brought to the emergency room from the cancer center when he was found to have a hemoglobin of 6.8.  Discharge Diagnoses:  Active Problems:   Obesity, Class III, BMI 40-49.9 (morbid obesity) (HCC)   Primary adrenocortical insufficiency (HCC)   Anemia   Chronic obstructive pulmonary disease (HCC)   Schizo affective schizophrenia (HCC)   Mass of colon   Palliative care encounter   Colon adenocarcinoma ([place])   DNR (do not resuscitate)   Chronic diastolic CHF (congestive heart failure) (HCC)   Rheumatoid arthritis (HCC)   Chronic respiratory failure with hypoxia ([place])  1. Acute on chronic anemia with heme positive stools.  Patient was seen by gastroenterology.  He underwent EGD that was found to be within normal limits.  Colonoscopy was also performed that showed proximal ascending colon mass.  Biopsies were performed that confirmed adenocarcinoma.  He was seen by general surgery regarding surgical resection.  Family is currently unsure whether they wish to proceed with invasive measures like this.  Surgery will see the patient for Port-A-Cath placement since he  has very poor venous access.  His new cancer diagnosis was also discussed with his primary oncologist who will follow the patient up in the outpatient setting.  Further discussions around treatment and palliative care will be discussed with family in the outpatient setting.  Staging CTs were performed that did not show any other evidence of disease in the abdomen or chest.  Patient was transfused a total of 2 units of PRBC during his hospital stay.  Follow-up hemoglobin has been stable.  He is not on any evidence of gross bleeding. 2. Chronic schizophrenia.  Continue on chronic psych meds. 3. [name].  Currently breathing comfortably on 2 L.  No evidence of wheezing. 4. Chronic diastolic heart failure.  Appears euvolemic at this time. 5. Rheumatoid arthritis.  Continue Plaquenil 6. Chronic adrenal insufficiency.  Continue on steroid supplementation.  Patient was to be discharged on 12/12, but discharged was delayed due to pending covid19 test. His test has come back negative and he is stable for discharge.  Discharge Instructions  Discharge Instructions    Diet - low sodium heart healthy   Complete by: As directed    Increase activity slowly   Complete by: As directed      Allergies as of 11/10/2019      Reactions   Methadone    Penicillins    Tolerates cephalosporins   Propoxyphene    Sulfa Antibiotics    Valproic Acid And Related    Thrombocytopenia      Medication List    TAKE these medications   acetaminophen 325 MG tablet Commonly known as: TYLENOL Take 650 mg by mouth every 4 (four) hours as needed for mild pain or  moderate pain.   albuterol 108 (90 Base) MCG/ACT inhaler Commonly known as: VENTOLIN HFA Inhale 1 puff into the lungs every 3 (three) hours as needed for wheezing or shortness of breath.   ammonium lactate 12 % lotion Commonly known as: LAC-HYDRIN Apply 1 application topically 2 (two) times daily. Apply topically to bilateral feet   benztropine  0.5 MG tablet Commonly known as: COGENTIN Take 0.5 mg by mouth 2 (two) times daily.   docusate sodium 100 MG capsule Commonly known as: COLACE Take 100 mg by mouth 2 (two) times daily.   feeding supplement (PRO-STAT SUGAR FREE 64) Liqd Take 30 mLs by mouth 3 (three) times daily with meals. Sugar free   ferrous sulfate 325 (65 FE) MG tablet Take 1 tablet (325 mg total) by mouth 2 (two) times daily with a meal.   fluPHENAZine 5 MG tablet Commonly known as: PROLIXIN Take 3 tablets (15 mg total) by mouth at bedtime.   fluPHENAZine 5 MG tablet Commonly known as: PROLIXIN Take 3 tablets (15 mg total) by mouth daily.   furosemide 20 MG tablet Commonly known as: Lasix Take 1 tablet (20 mg total) by mouth daily.   guaifenesin 100 MG/5ML syrup Commonly known as: ROBITUSSIN Take 100 mg by mouth every 4 (four) hours as needed for cough.   hydrocortisone 10 MG tablet Commonly known as: CORTEF Take 1 tablet (10 mg total) by mouth 2 (two) times daily.   hydroxychloroquine 200 MG tablet Commonly known as: PLAQUENIL Take 200 mg by mouth 2 (two) times daily.   levothyroxine 200 MCG tablet Commonly known as: SYNTHROID Take 200 mcg by mouth daily before breakfast.   LORazepam 2 MG tablet Commonly known as: ATIVAN Take 2 mg by mouth every 6 (six) hours as needed for anxiety.   methocarbamol 750 MG tablet Commonly known as: ROBAXIN Take 750 mg by mouth at bedtime.   metoprolol tartrate 50 MG tablet Commonly known as: LOPRESSOR Take 1 tablet (50 mg total) by mouth 2 (two) times daily.   MIRALAX PO Take 17 g by mouth daily.   multivitamin with minerals tablet Take 1 tablet by mouth daily.   pantoprazole 40 MG tablet Commonly known as: PROTONIX Take 1 tablet (40 mg total) by mouth daily.   Potassium Chloride ER 20 MEQ Tbcr Take 10 mEq by mouth daily.   tamsulosin 0.4 MG Caps capsule Commonly known as: FLOMAX Take 0.4 mg by mouth daily.   traMADol 50 MG tablet Commonly  known as: ULTRAM Take 50 mg by mouth every 6 (six) hours as needed for moderate pain or severe pain.   vitamin C 500 MG tablet Commonly known as: ASCORBIC ACID Take 500 mg by mouth 2 (two) times daily.       Allergies  Allergen Reactions  . Methadone   . Penicillins     Tolerates cephalosporins   . Propoxyphene   . Sulfa Antibiotics   . Valproic Acid And Related     Thrombocytopenia    Consultations:  General surgery  Gastroenterology   Procedures/Studies: CT CHEST W CONTRAST  Result Date: 11/07/2019 CLINICAL DATA:  Staging for colorectal carcinoma. Colon mass seen on colonoscopy on 11/02/2019. EXAM: CT CHEST, ABDOMEN, AND PELVIS WITH CONTRAST TECHNIQUE: Multidetector CT imaging of the chest, abdomen and pelvis was performed following the standard protocol during bolus administration of intravenous contrast. CONTRAST:  123mL OMNIPAQUE IOHEXOL 300 MG/ML  SOLN COMPARISON:  None. FINDINGS: CT CHEST FINDINGS Cardiovascular: Heart is normal size. No pericardial effusion. Three-vessel  coronary artery calcifications. Great vessels are normal in caliber. Mild aortic atherosclerosis. Mediastinum/Nodes: No neck base, axillary, mediastinal or hilar masses or enlarged lymph nodes. Trachea and esophagus are unremarkable. Lungs/Pleura: No lung mass or nodule. Bilateral areas of bronchiectasis, irregular interstitial thickening and architectural distortion with honeycombing noted in the posterior inferior lower lobes and, to lesser degree, left upper lobe lingula. Findings consistent with interstitial fibrosis, which was present at the lung bases on the prior CT. No evidence of pneumonia or pulmonary edema. No pleural effusion or pneumothorax. Musculoskeletal: No fracture or acute finding. No osteoblastic or osteolytic lesions. CT ABDOMEN PELVIS FINDINGS Hepatobiliary: Somewhat limited due to artifact from the overlying left arm. Liver normal in size. No mass or focal lesion. Subtle dependent  gallstones. No gallbladder wall thickening or evidence of acute cholecystitis. No bile duct dilation. Pancreas: Unremarkable. No pancreatic ductal dilatation or surrounding inflammatory changes. Spleen: Normal in size without focal abnormality. Adrenals/Urinary Tract: 14 mm left adrenal nodule, unchanged compared to the prior CT. Normal right adrenal gland. Kidneys normal in size, orientation and position with symmetric enhancement and excretion. No masses, stones or hydronephrosis. Normal ureters. Normal bladder. Stomach/Bowel: Colon is normal in caliber. The colon mass noted on the recent colonoscopy is not defined on CT. No wall thickening or inflammation. Stomach is unremarkable. Small bowel is normal in caliber with no wall thickening or inflammation. No evidence of appendicitis. Appendix not definitively seen. Vascular/Lymphatic: Mild aortic atherosclerosis. No aneurysm. No enlarged lymph nodes. Reproductive: Prostate not enlarged. Other: Small umbilical hernia. No ascites. There is subcutaneous soft tissue edema evident along right lateral thigh to right pelvis. Musculoskeletal: Chronic fracture of the left femoral neck with fragmentation of the femoral head, widening of the acetabula and acetabular protrusio. There are advanced right hip joint arthropathic changes. These findings are chronic. There are no acute fractures. No osteoblastic or osteolytic lesions. IMPRESSION: 1. No evidence of locally invasive colorectal carcinoma or of metastatic disease. 2. The colon mass noted on the recent colonoscopy is not visualized on CT. 3. No acute abnormalities within the chest, abdomen or pelvis. 4. Coronary artery calcifications and mild aortic atherosclerosis. 5. Lungs demonstrate changes of interstitial fibrosis. 6. Small dependent gallstones, but no evidence of acute cholecystitis. 7. Marked arthropathic changes the hips, also noted of the shoulders. Findings consistent with rheumatoid arthritis. Electronically  Signed   By: Lajean Manes M.D.   On: 11/07/2019 20:00   CT ABDOMEN PELVIS W CONTRAST  Result Date: 11/07/2019 CLINICAL DATA:  Staging for colorectal carcinoma. Colon mass seen on colonoscopy on 11/02/2019. EXAM: CT CHEST, ABDOMEN, AND PELVIS WITH CONTRAST TECHNIQUE: Multidetector CT imaging of the chest, abdomen and pelvis was performed following the standard protocol during bolus administration of intravenous contrast. CONTRAST:  119mL OMNIPAQUE IOHEXOL 300 MG/ML  SOLN COMPARISON:  None. FINDINGS: CT CHEST FINDINGS Cardiovascular: Heart is normal size. No pericardial effusion. Three-vessel coronary artery calcifications. Great vessels are normal in caliber. Mild aortic atherosclerosis. Mediastinum/Nodes: No neck base, axillary, mediastinal or hilar masses or enlarged lymph nodes. Trachea and esophagus are unremarkable. Lungs/Pleura: No lung mass or nodule. Bilateral areas of bronchiectasis, irregular interstitial thickening and architectural distortion with honeycombing noted in the posterior inferior lower lobes and, to lesser degree, left upper lobe lingula. Findings consistent with interstitial fibrosis, which was present at the lung bases on the prior CT. No evidence of pneumonia or pulmonary edema. No pleural effusion or pneumothorax. Musculoskeletal: No fracture or acute finding. No osteoblastic or osteolytic lesions. CT ABDOMEN PELVIS  FINDINGS Hepatobiliary: Somewhat limited due to artifact from the overlying left arm. Liver normal in size. No mass or focal lesion. Subtle dependent gallstones. No gallbladder wall thickening or evidence of acute cholecystitis. No bile duct dilation. Pancreas: Unremarkable. No pancreatic ductal dilatation or surrounding inflammatory changes. Spleen: Normal in size without focal abnormality. Adrenals/Urinary Tract: 14 mm left adrenal nodule, unchanged compared to the prior CT. Normal right adrenal gland. Kidneys normal in size, orientation and position with symmetric  enhancement and excretion. No masses, stones or hydronephrosis. Normal ureters. Normal bladder. Stomach/Bowel: Colon is normal in caliber. The colon mass noted on the recent colonoscopy is not defined on CT. No wall thickening or inflammation. Stomach is unremarkable. Small bowel is normal in caliber with no wall thickening or inflammation. No evidence of appendicitis. Appendix not definitively seen. Vascular/Lymphatic: Mild aortic atherosclerosis. No aneurysm. No enlarged lymph nodes. Reproductive: Prostate not enlarged. Other: Small umbilical hernia. No ascites. There is subcutaneous soft tissue edema evident along right lateral thigh to right pelvis. Musculoskeletal: Chronic fracture of the left femoral neck with fragmentation of the femoral head, widening of the acetabula and acetabular protrusio. There are advanced right hip joint arthropathic changes. These findings are chronic. There are no acute fractures. No osteoblastic or osteolytic lesions. IMPRESSION: 1. No evidence of locally invasive colorectal carcinoma or of metastatic disease. 2. The colon mass noted on the recent colonoscopy is not visualized on CT. 3. No acute abnormalities within the chest, abdomen or pelvis. 4. Coronary artery calcifications and mild aortic atherosclerosis. 5. Lungs demonstrate changes of interstitial fibrosis. 6. Small dependent gallstones, but no evidence of acute cholecystitis. 7. Marked arthropathic changes the hips, also noted of the shoulders. Findings consistent with rheumatoid arthritis. Electronically Signed   By: Lajean Manes M.D.   On: 11/07/2019 20:00      Subjective: Confused, no distress  Discharge Exam: Vitals:   11/09/19 1137 11/09/19 1601 11/09/19 2242 11/10/19 0638  BP: (!) 145/87 (!) 170/99 (!) 168/107 (!) 155/88  Pulse: 81 75 79 81  Resp:  20 20 20   Temp:   98 F (36.7 C) 98 F (36.7 C)  TempSrc:   Oral   SpO2:  100% 96% 100%  Weight:      Height:        General: Pt is no  distress Cardiovascular: RRR, S1/S2 +, no rubs, no gallops Respiratory: CTA bilaterally, no wheezing, no rhonchi Abdominal: Soft, NT, ND, bowel sounds + Extremities: no edema, no cyanosis    The results of significant diagnostics from this hospitalization (including imaging, microbiology, ancillary and laboratory) are listed below for reference.     Microbiology: Recent Results (from the past 240 hour(s))  MRSA PCR Screening     Status: Abnormal   Collection Time: 10/31/19  6:53 PM   Specimen: Nasal Mucosa; Nasopharyngeal  Result Value Ref Range Status   MRSA by PCR POSITIVE (A) NEGATIVE Final    Comment:        The GeneXpert MRSA Assay (FDA approved for NASAL specimens only), is one component of a comprehensive MRSA colonization surveillance program. It is not intended to diagnose MRSA infection nor to guide or monitor treatment for MRSA infections. RESULT CALLED TO, READ BACK BY AND VERIFIED WITH: DAWN SONGSTER 10/31/2019 2030 KMP Performed at [facility], [address] [place], [place] [address]   SARS CORONAVIRUS 2 (TAT 6-24 HRS) Nasopharyngeal Nasopharyngeal Swab     Status: None   Collection Time: 11/03/19  1:32 PM  Specimen: Nasopharyngeal Swab  Result Value Ref Range Status   SARS Coronavirus 2 NEGATIVE NEGATIVE Final    Comment: (NOTE) SARS-CoV-2 target nucleic acids are NOT DETECTED. The SARS-CoV-2 RNA is generally detectable in upper and lower respiratory specimens during the acute phase of infection. Negative results do not preclude SARS-CoV-2 infection, do not rule out co-infections with other pathogens, and should not be used as the sole basis for treatment or other patient management decisions. Negative results must be combined with clinical observations, patient history, and epidemiological information. The expected result is Negative. Fact Sheet for Patients: SugarRoll.be Fact Sheet for Healthcare  Providers: https://www.woods-mathews.com/ This test is not yet approved or cleared by the [place] FDA and  has been authorized for detection and/or diagnosis of SARS-CoV-2 by FDA under an Emergency Use Authorization (EUA). This EUA will remain  in effect (meaning this test can be used) for the duration of the COVID-19 declaration under Section 56 4(b)(1) of the Act, 21 U.S.C. section 360bbb-3(b)(1), unless the authorization is terminated or revoked sooner. Performed at [facility], [place] [address] [place], [place] [address]   SARS CORONAVIRUS 2 (TAT 6-24 HRS) Nasopharyngeal Nasopharyngeal Swab     Status: None   Collection Time: 11/08/19  1:51 PM   Specimen: Nasopharyngeal Swab  Result Value Ref Range Status   SARS Coronavirus 2 NEGATIVE NEGATIVE Final    Comment: (NOTE) SARS-CoV-2 target nucleic acids are NOT DETECTED. The SARS-CoV-2 RNA is generally detectable in upper and lower respiratory specimens during the acute phase of infection. Negative results do not preclude SARS-CoV-2 infection, do not rule out co-infections with other pathogens, and should not be used as the sole basis for treatment or other patient management decisions. Negative results must be combined with clinical observations, patient history, and epidemiological information. The expected result is Negative. Fact Sheet for Patients: SugarRoll.be Fact Sheet for Healthcare Providers: https://www.woods-mathews.com/ This test is not yet approved or cleared by the [place] FDA and  has been authorized for detection and/or diagnosis of SARS-CoV-2 by FDA under an Emergency Use Authorization (EUA). This EUA will remain  in effect (meaning this test can be used) for the duration of the COVID-19 declaration under Section 56 4(b)(1) of the Act, 21 U.S.C. section 360bbb-3(b)(1), unless the authorization is terminated or revoked sooner. Performed at  [facility], [place] [address] [place], [place] [address]      Labs: BNP (last 3 results) Recent Labs    09/12/19 0000 09/13/19 0141 09/14/19 0123  BNP 650.2* 502.3* [id number]*   Basic Metabolic Panel: Recent Labs  Lab 11/07/19 0618  NA 140  K 3.5  CL 100  CO2 32  GLUCOSE 106*  BUN 6*  CREATININE 0.38*  CALCIUM 8.2*  PHOS 3.9   Liver Function Tests: Recent Labs  Lab 11/07/19 0618  ALBUMIN 2.3*   No results for input(s): LIPASE, AMYLASE in the last 168 hours. No results for input(s): AMMONIA in the last 168 hours. CBC: Recent Labs  Lab 11/05/19 0428 11/06/19 1127 11/07/19 0618 11/08/19 0521  WBC 4.6 6.7 5.1  --   NEUTROABS 3.1  --   --   --   HGB 7.2* 7.0* 7.1* 8.1*  HCT 24.8* 24.6* 24.4* 27.3*  MCV 78.7* 80.9 75.8*  --   PLT 271 169 272  --    Cardiac Enzymes: No results for input(s): CKTOTAL, CKMB, CKMBINDEX, TROPONINI in the last 168 hours. BNP: Invalid input(s): POCBNP CBG: No results for input(s): GLUCAP  in the last 168 hours. D-Dimer No results for input(s): DDIMER in the last 72 hours. Hgb A1c No results for input(s): HGBA1C in the last 72 hours. Lipid Profile No results for input(s): CHOL, HDL, LDLCALC, TRIG, CHOLHDL, LDLDIRECT in the last 72 hours. Thyroid function studies No results for input(s): TSH, T4TOTAL, T3FREE, THYROIDAB in the last 72 hours.  Invalid input(s): FREET3 Anemia work up No results for input(s): VITAMINB12, FOLATE, FERRITIN, TIBC, IRON, RETICCTPCT in the last 72 hours. Urinalysis    Component Value Date/Time   COLORURINE YELLOW (A) 12/25/2017 1601   APPEARANCEUR HAZY (A) 12/25/2017 1601   APPEARANCEUR Hazy 01/19/2013 1635   LABSPEC 1.018 12/25/2017 1601   LABSPEC 1.008 01/19/2013 1635   PHURINE 5.0 12/25/2017 1601   GLUCOSEU NEGATIVE 12/25/2017 1601   GLUCOSEU Negative 01/19/2013 1635   HGBUR NEGATIVE 12/25/2017 1601   BILIRUBINUR NEGATIVE 12/25/2017 1601   BILIRUBINUR Negative 01/19/2013 1635   KETONESUR  NEGATIVE 12/25/2017 1601   PROTEINUR NEGATIVE 12/25/2017 1601   NITRITE POSITIVE (A) 12/25/2017 1601   LEUKOCYTESUR LARGE (A) 12/25/2017 1601   LEUKOCYTESUR Negative 01/19/2013 1635   Sepsis Labs Invalid input(s): PROCALCITONIN,  WBC,  LACTICIDVEN Microbiology Recent Results (from the past 240 hour(s))  MRSA PCR Screening     Status: Abnormal   Collection Time: 10/31/19  6:53 PM   Specimen: Nasal Mucosa; Nasopharyngeal  Result Value Ref Range Status   MRSA by PCR POSITIVE (A) NEGATIVE Final    Comment:        The GeneXpert MRSA Assay (FDA approved for NASAL specimens only), is one component of a comprehensive MRSA colonization surveillance program. It is not intended to diagnose MRSA infection nor to guide or monitor treatment for MRSA infections. RESULT CALLED TO, READ BACK BY AND VERIFIED WITH: DAWN SONGSTER 10/31/2019 2030 KMP Performed at [facility], [place], [place] [address]   SARS CORONAVIRUS 2 (TAT 6-24 HRS) Nasopharyngeal Nasopharyngeal Swab     Status: None   Collection Time: 11/03/19  1:32 PM   Specimen: Nasopharyngeal Swab  Result Value Ref Range Status   SARS Coronavirus 2 NEGATIVE NEGATIVE Final    Comment: (NOTE) SARS-CoV-2 target nucleic acids are NOT DETECTED. The SARS-CoV-2 RNA is generally detectable in upper and lower respiratory specimens during the acute phase of infection. Negative results do not preclude SARS-CoV-2 infection, do not rule out co-infections with other pathogens, and should not be used as the sole basis for treatment or other patient management decisions. Negative results must be combined with clinical observations, patient history, and epidemiological information. The expected result is Negative. Fact Sheet for Patients: SugarRoll.be Fact Sheet for Healthcare Providers: https://www.woods-mathews.com/ This test is not yet approved or cleared by the [place]  FDA and  has been authorized for detection and/or diagnosis of SARS-CoV-2 by FDA under an Emergency Use Authorization (EUA). This EUA will remain  in effect (meaning this test can be used) for the duration of the COVID-19 declaration under Section 56 4(b)(1) of the Act, 21 U.S.C. section 360bbb-3(b)(1), unless the authorization is terminated or revoked sooner. Performed at [facility], [place] [address], [place], [place] [address]   SARS CORONAVIRUS 2 (TAT 6-24 HRS) Nasopharyngeal Nasopharyngeal Swab     Status: None   Collection Time: 11/08/19  1:51 PM   Specimen: Nasopharyngeal Swab  Result Value Ref Range Status   SARS Coronavirus 2 NEGATIVE NEGATIVE Final    Comment: (NOTE) SARS-CoV-2 target nucleic acids are NOT DETECTED. The SARS-CoV-2  RNA is generally detectable in upper and lower respiratory specimens during the acute phase of infection. Negative results do not preclude SARS-CoV-2 infection, do not rule out co-infections with other pathogens, and should not be used as the sole basis for treatment or other patient management decisions. Negative results must be combined with clinical observations, patient history, and epidemiological information. The expected result is Negative. Fact Sheet for Patients: SugarRoll.be Fact Sheet for Healthcare Providers: https://www.woods-mathews.com/ This test is not yet approved or cleared by the [place] FDA and  has been authorized for detection and/or diagnosis of SARS-CoV-2 by FDA under an Emergency Use Authorization (EUA). This EUA will remain  in effect (meaning this test can be used) for the duration of the COVID-19 declaration under Section 56 4(b)(1) of the Act, 21 U.S.C. section 360bbb-3(b)(1), unless the authorization is terminated or revoked sooner. Performed at [facility], [place] [address], [place], [place] [address]      Time coordinating discharge:  74mins  SIGNED:   Kathie Dike, MD  [facility] 11/10/2019, 10:50 AM   If 7PM-7AM, please contact night-coverage [email or web address]

## 2019-11-10 NOTE — Progress Notes (Signed)
Patient was to be discharged to skilled nursing facility today.  Was called by staff to reevaluate patient due to worsening breathing.  Upon my arrival, patient did have increasing rhonchi and wheezing as compared to this morning when I evaluated him.  He is requiring more oxygen.  Chest x-ray was performed and showed right upper lobe pneumonia.  Question aspiration event.  Will discontinue discharge plan for today.  Start on IV antibiotics, bronchodilators, inhaled steroids.  Monitor respiratory status.  [employer]

## 2019-11-10 NOTE — Progress Notes (Signed)
Patient having labored breathing with wheezing. Raised HOB and administered breathing treatment. Alerted MD. [facility] transport.   Fuller Mandril, RN

## 2019-11-10 NOTE — Progress Notes (Signed)
Gave report to Athal[facility] with [facility] taking patient [name].   Fuller Mandril, RN

## 2019-11-10 NOTE — Progress Notes (Signed)
Called [facility] to update staff. Patient discharge cancelled by MD.   Fuller Mandril, RN

## 2019-11-10 NOTE — Progress Notes (Signed)
Spoke with legal guardian [name] to update her that patient would be transferred back to [facility] today.   Fuller Mandril, RN

## 2019-11-10 NOTE — Progress Notes (Signed)
Called EMS for non-emergent transport to [facility] room 808.   Fuller Mandril, RN

## 2019-11-10 NOTE — TOC Transition Note (Addendum)
Transition of Care [facility]) - CM/SW Discharge Note   Patient Details  Name: Jerry [name] MRN: [id number] Date of Birth: 02-Jun-1956  Transition of Care [facility]) CM/SW Contact:  Beverly Sessions, RN Phone Number: 11/10/2019, 5:24 PM   Clinical Narrative:    Patient to discharge back to [facility] today  Voicemail left for legal guardian  Discharge information sent to [facility] in the [place]  EMS packet on chart.  Bedside RN notified   Update:  Dc cancelled, Tina at [facility] notified     Barriers to Discharge: Continued Medical Work up   Patient Goals and CMS Choice        Discharge Placement                       Discharge Plan and Services In-house Referral: Clinical Social Work                                   Social Determinants of Health (SDOH) Interventions     Readmission Risk Interventions Readmission Risk Prevention Plan 11/03/2019  Transportation Screening Complete  HRI or [place] Not Complete  HRI or Home Care Consult comments Pt lives at a [place] for [place] Planning/Counseling [place] Not Applicable  Medication Review Press photographer) Complete  Some recent data might be hidden

## 2019-11-11 LAB — BASIC METABOLIC PANEL
Anion gap: 10 (ref 5–15)
BUN: 14 mg/dL (ref 8–23)
CO2: 29 mmol/L (ref 22–32)
Calcium: 8.6 mg/dL — ABNORMAL LOW (ref 8.9–10.3)
Chloride: 98 mmol/L (ref 98–111)
Creatinine, Ser: 0.54 mg/dL — ABNORMAL LOW (ref 0.61–1.24)
GFR calc Af Amer: >60 mL/min (ref >60)
GFR calc non Af Amer: >60 mL/min (ref >60)
Glucose, Bld: 105 mg/dL — ABNORMAL HIGH (ref 70–99)
Potassium: 4.5 mmol/L (ref 3.5–5.1)
Sodium: 137 mmol/L (ref 135–145)

## 2019-11-11 LAB — CBC
HCT: 29.2 % — ABNORMAL LOW (ref 39.0–52.0)
Hemoglobin: 9 g/dL — ABNORMAL LOW (ref 13.0–17.0)
MCH: 23.6 pg — ABNORMAL LOW (ref 26.0–34.0)
MCHC: 30.8 g/dL (ref 30.0–36.0)
MCV: 76.4 fL — ABNORMAL LOW (ref 80.0–100.0)
Platelets: 301 10*3/uL (ref 150–400)
RBC: 3.82 MIL/uL — ABNORMAL LOW (ref 4.22–5.81)
RDW: 18.7 % — ABNORMAL HIGH (ref 11.5–15.5)
WBC: 8.9 10*3/uL (ref 4.0–10.5)
nRBC: 0 % (ref 0.0–0.2)

## 2019-11-11 NOTE — Progress Notes (Signed)
[place] at [place] NAME: Jerry [name]    MR#:  [id number]  DATE OF BIRTH:  03-19-1956  SUBJECTIVE:  Confused, cannot provide reliable history, staff had noticed increased coughing and shortness of breath REVIEW OF SYSTEMS:   Review of Systems  Unable to perform ROS: Psychiatric disorder   Tolerating Diet:heart healthy/carb mod Tolerating PT: does not walk, bedbound at baseline  DRUG ALLERGIES:   Allergies  Allergen Reactions  . Methadone   . Penicillins     Tolerates cephalosporins   . Propoxyphene   . Sulfa Antibiotics   . Valproic Acid And Related     Thrombocytopenia    VITALS:  Blood pressure (!) 150/101, pulse 96, temperature 99.5 F (37.5 C), resp. rate (!) 40, height 6\' 2"  (1.88 m), weight (!) 157.1 kg, SpO2 96 %.  PHYSICAL EXAMINATION:   Physical Exam  General exam: Alert, awake, no distress Respiratory system: Increased rhonchi and wheezing bilaterally.  Mild increase in respiratory effort. Cardiovascular system:RRR. No murmurs, rubs, gallops. Gastrointestinal system: Abdomen is nondistended, soft and nontender. No organomegaly or masses felt. Normal bowel sounds heard. Central nervous system:  No focal neurological deficits. Extremities: Changes consistent with severe rheumatoid arthritis in hands. Skin: No rashes, lesions or ulcers Psychiatry: Confused   LABORATORY PANEL:  CBC Recent Labs  Lab 11/11/19 0031  WBC 8.9  HGB 9.0*  HCT 29.2*  PLT 301    Chemistries  Recent Labs  Lab 11/11/19 0031  NA 137  K 4.5  CL 98  CO2 29  GLUCOSE 105*  BUN 14  CREATININE 0.54*  CALCIUM 8.6*   Cardiac Enzymes No results for input(s): TROPONINI in the last 168 hours. RADIOLOGY:  DG Chest Port 1 View  Result Date: 11/10/2019 CLINICAL DATA:  Increased short of breath EXAM: PORTABLE CHEST 1 VIEW COMPARISON:  09/11/2019 FINDINGS: Improvement in diffuse bilateral airspace disease. Probable baseline scarring  in the bases similar to 12/27/2017. No pleural effusion. Right upper lobe airspace disease is more prominent compared with 2019 and could represent pneumonia. IMPRESSION: Prominent lung markings at the bases appear chronic. Possible right upper pneumonia. Electronically Signed   By: Franchot Gallo M.D.   On: 11/10/2019 15:25   ASSESSMENT AND PLAN:  Jerry [name]  is a 63 y.o. 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  1.Acute on chronic anemia with heme positive stools secondary to newly diagnosed colon cancer -patient s/p 2 units of blood transfusion -Hemoglobin was 6.8 on admission.  Since that time, hemoglobins have stabilized. -He is on PPI IV twice daily -G.I. consultation with Dr. Mickie [name] appreciated -EGD-- within normal limits -colonoscopy--An ulcerated non-obstructing large mass was found in the proximal       ascending colon. The mass was partially circumferential (involving       one-half of the lumen circumference). The mass measured one cm in       length. t -Biopsy of mass confirms invasive adenocarcinoma -patient is followed closely for his anemia at the cancer center by Dr. Rogue [name] -Dr. Lysle [name] from surgery consulted.  Family is unsure whether they wish to proceed with surgical resection -Palliative care consultation had been obtained, appreciate assistance -Surgery will consider outpatient Port-A-Cath placement since patient has  very poor venous access.   -After discussion with oncology, staging CTs were ordered that did not show any evidence of metastatic disease. -Patient will follow-up with oncology and palliative care as an outpatient to discuss further goals of treatment  2. Chronic  schizophrenia -continue his psych meds  3. COPD on chronic home oxygen at 2 L -continue inhalers as needed  4. Chronic diastolic congestive heart failure EF 60% by echo in 2018.  Currently appears to be euvolemic  5. Chronic thrombocytopenia -continue to monitor  6. BPH on Flomax  7. Rheumatoid arthritis. On chronic hydroxychloroquine and chronic hydrocortisone.  8.  Aspiration pneumonia.  Patient has developed more shortness of breath and coughing.  He has increased rhonchi on exam.  Chest x-[name] showed right upper lobe pneumonia.  Suspect that he had aspirated.  He was started on intravenous antibiotics and swallow evaluation was requested.  Continue pulmonary hygiene with bronchodilators, flutter valve and mucolytic's.  At baseline patient is bed bound at baseline and is form a long term facility   Family Communication: patient's niece Jerry [name] on the phone  Consults: G.I, general surgery, oncology, palliative care. Code Status: DNR-- prior to admission. Confirmed with niece Jerry who is HCPOA DVT prophylaxis:SCD due to TCP  TOTAL TIME TAKING CARE OF THIS PATIENT: *35* minutes.  >50% time spent on counselling and coordination of care   Jerry [name] M.D on 11/11/2019 at 8:38 PM  After 6pm go to [email or web address]  [facility]   CC: Primary care physician; Jerry [name], MDPatient ID: Jerry [name], male   DOB: 1956-04-04, 63 y.o.   MRN: [id number]

## 2019-11-11 NOTE — TOC Progression Note (Signed)
Transition of Care [facility]) - Progression Note    Patient Details  Name: Jerry [name] MRN: [id number] Date of Birth: 08/09/1956  Transition of Care [facility]) CM/SW Contact  Beverly Sessions, RN Phone Number: 11/11/2019, 3:43 PM  Clinical Narrative:    Notified that patient would not be discharging today.  Notified [facility] at [facility]  Expected Discharge Plan: Long Term Nursing Home Barriers to Discharge: Continued Medical Work up  Expected Discharge Plan and Services Expected Discharge Plan: [place] In-house Referral: Clinical Social Work     Living arrangements for the past 2 months: [place] Expected Discharge Date: 11/08/19                                     Social Determinants of Health (SDOH) Interventions    Readmission Risk Interventions Readmission Risk Prevention Plan 11/03/2019  Transportation Screening Complete  HRI or [place] Not Complete  HRI or Home Care Consult comments Pt lives at a [place] for [place] Planning/Counseling [place] Not Applicable  Medication Review Press photographer) Complete  Some recent data might be hidden

## 2019-11-11 NOTE — Evaluation (Addendum)
Clinical/Bedside Swallow Evaluation Patient Details  Name: Jerry [name] MRN: [id number] Date of Birth: Aug 28, 1956  Today's Date: 11/11/2019 Time: SLP Start Time (ACUTE ONLY): 26 SLP Stop Time (ACUTE ONLY): 1230 SLP Time Calculation (min) (ACUTE ONLY): 60 min  Past Medical History:  Past Medical History:  Diagnosis Date  . BPH (benign prostatic hyperplasia)   . CHF (congestive heart failure) ([place])   . Chronic pain   . COPD (chronic obstructive pulmonary disease) ([place])   . Hypertension   . Morbid obesity ([place])   . Pressure ulcer   . Rheumatoid arthritis ([place])   . Schizophrenia ([place])   . Thyroid disease    Past Surgical History:  Past Surgical History:  Procedure Laterality Date  . COLONOSCOPY WITH PROPOFOL N/A 11/02/2019   Procedure: COLONOSCOPY WITH PROPOFOL;  Surgeon: Lucilla Lame, MD;  [facility] END[facility]COPY;  Service: Endoscopy;  Laterality: N/A;  . COLONOSCOPY WITH PROPOFOL N/A 11/04/2019   Procedure: COLONOSCOPY WITH PROPOFOL;  Surgeon: Jonathon Bellows, MD;  Location: [facility] ENDOSCOPY;  Service: Gastroenterology;  Laterality: N/A;  . ESOPHAGOGASTRODUODENOSCOPY (EGD) WITH PROPOFOL N/A 10/31/2019   Procedure: ESOPHAGOGASTRODUODENOSCOPY (EGD) WITH PROPOFOL;  Surgeon: Virgel Manifold, MD;  Location: [facility] ENDOSCOPY;  Service: Endoscopy;  Laterality: N/A;   HPI:  Pt is a 63 y.o. 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 He had heme positive stools in the emergency room.  [facility]. At baseline he does not ambulate.  He exhibited Confusion during this evaluation -- similar was noted per ED MD notes.    Assessment / Plan / Recommendation Clinical  Impression  This pleasantly, confused male presents with oropharyngeal phase dysphagia w/ increased risk for aspiration secondary to his baseline Cognitive impairment and significantly declined Pulmonary status. Pt is Morbidly Obese and is sedentary; does not ambulate at baseline per chart. He is dependent on being fed at baseline d/t RA affecting his hands. These factors TOGETHER increase pt's risk for aspiration --- though risk can be reduced following aspiration precautions and modifying the diet at this time. Pt's Cognitive Confusion impacted his follow through w/ instructions thus frequent verbal cues were given. Pt immediately refused po trials stating significant "fullness" from breakfast meal. Also, noted pt's increased WOB and SOB w/ any exertion including talking. With encouragement from SLP, pt consumed few po trials w/ No immediate, overt clinical s/s of aspiration -- clear vocal quality was assessed b/t trials. Strict Rest Breaks given b/t each trial, and pt was instructed to NOT TALK during po intake (both in hopes to reduce aspriation risk). Oral phase c/b decreased oral effort and coordination during mastication of soft solids -- more of a munching pattern noted; less rotary movement. This resulted in Min transit delay with soft solids. Trials of thin liquids and puree were adequate for oral phase management and timely A-P transfer. No oral residue noted with all trials. Upon entering room and throughout evaluation, noted effortful WOB/SOB even at rest; respirations were min increased in rate and accessory muscles and abdominal breathing noted.  Due to oral phase deficits, marked decreased Cognition and Pulmonary status', and need to be fed by others, pt at increased risk of aspiration and would benefit from a modified diet of Dysphagia level 2  w/ gravies, Thin liquids (monitor for need to move to thicken liquids) w/ strict aspiration precautions; Pills whole vs crushed in Puree; total assist at  meals. Rec: feed pt SLOWLY, limit distractions, feed ONLY when calm breathing w/ strict adherence to aspiration precautions.  ST services will f/u w/ trials to upgrade diet as appropriate while admitted or w/ f/u at SNF post discharge. NSG updated.  SLP Visit Diagnosis: Dysphagia, oropharyngeal phase (R13.12)(baseline Respiratory status decline)    Aspiration Risk  Mild aspiration risk;Risk for inadequate nutrition/hydration(reduced following aspiration precautions)    Diet Recommendation  Dysphagia level 2 (MINCED foods w/ Gravies to moisten); Thin liquids. Strict aspiration precautions. Feeding Support at all meals. Rest breaks during meals to lessen WOB/SOB. Reduce distractions during meals -- Talking  Medication Administration: Whole meds with puree(for safer swallowing)    Other  Recommendations Recommended Consults: (Dietician f/u; Palliative Care f/u) Oral Care Recommendations: Oral care BID;Oral care before and after PO;Staff/trained caregiver to provide oral care Other Recommendations: (n/a at this time)   Follow up Recommendations Skilled Nursing facility      Frequency and Duration min 3x week  2 weeks       Prognosis Prognosis for Safe Diet Advancement: Fair Barriers to Reach Goals: Cognitive deficits;Time post onset;Severity of deficits(Pulmonary status decline)      Swallow Study   General Date of Onset: 10/30/19 HPI: Pt is a 63 y.o. 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 He had heme positive stools in the emergency room.  [facility] is a [facility]. At baseline he does not ambulate.  He exhibited Confusion during this evaluation -- similar was noted per  ED MD notes.  Type of Study: Bedside Swallow Evaluation Previous Swallow Assessment: seen by ST services in 2018, 2019, 2020 - last admit was at [facility] in which diet was a mech soft w/ Thin liquids. Diet Prior to this Study: Regular;Thin liquids Temperature Spikes Noted: N/A(wbc 8.9) Respiratory Status: Nasal cannula(3 L) History of Recent Intubation: No Behavior/Cognition: Alert;Cooperative;Pleasant mood;Confused;Distractible;Requires cueing;Doesn't follow directions Oral Cavity Assessment: Within Functional Limits Oral Care Completed by SLP: Recent completion by staff Oral Cavity - Dentition: Adequate natural dentition Vision: (n/a) Self-Feeding Abilities: Total assist(RA) Patient Positioning: Upright in bed(needed positioning) Baseline Vocal Quality: Normal(SOB/WOB) Volitional Cough: Strong;Congested(min) Volitional Swallow: Able to elicit(w/ cues)    Oral/Motor/Sensory Function Overall Oral Motor/Sensory Function: Within functional limits(grossly)   Ice Chips Ice chips: Within functional limits Presentation: Spoon(fed; 3 trials) Other Comments: WOB/SOB as at baseline   Thin Liquid Thin Liquid: Within functional limits Presentation: Cup;Straw(fed; 3 trials each) Other Comments: WOB/SOB as at baseline    Nectar Thick Nectar Thick Liquid: Not tested   Honey Thick Honey Thick Liquid: Not tested   Puree Puree: Within functional limits Presentation: Spoon(fed; 2 trials) Other Comments: WOB/SOB as at baseline   Solid     Solid: Impaired Presentation: Spoon(fed; 1 trial) Oral Phase Impairments: Impaired mastication Oral Phase Functional Implications: Prolonged oral transit;Impaired mastication(d/t WOB/SOB as at baseline) Pharyngeal Phase Impairments: (none)        Orinda Kenner, MS, CCC-SLP Kapri Nero 11/11/2019,4:06 PM

## 2019-11-12 ENCOUNTER — Inpatient Hospital Stay: Payer: Medicaid Other

## 2019-11-12 DIAGNOSIS — Z66 Do not resuscitate: Secondary | ICD-10-CM

## 2019-11-12 DIAGNOSIS — I5032 Chronic diastolic (congestive) heart failure: Secondary | ICD-10-CM

## 2019-11-12 LAB — BASIC METABOLIC PANEL
Anion gap: 12 (ref 5–15)
BUN: 19 mg/dL (ref 8–23)
CO2: 27 mmol/L (ref 22–32)
Calcium: 8.6 mg/dL — ABNORMAL LOW (ref 8.9–10.3)
Chloride: 95 mmol/L — ABNORMAL LOW (ref 98–111)
Creatinine, Ser: 0.64 mg/dL (ref 0.61–1.24)
GFR calc Af Amer: >60 mL/min (ref >60)
GFR calc non Af Amer: >60 mL/min (ref >60)
Glucose, Bld: 90 mg/dL (ref 70–99)
Potassium: 4.3 mmol/L (ref 3.5–5.1)
Sodium: 134 mmol/L — ABNORMAL LOW (ref 135–145)

## 2019-11-12 LAB — CBC
HCT: 30.5 % — ABNORMAL LOW (ref 39.0–52.0)
Hemoglobin: 9.1 g/dL — ABNORMAL LOW (ref 13.0–17.0)
MCH: 23.8 pg — ABNORMAL LOW (ref 26.0–34.0)
MCHC: 29.8 g/dL — ABNORMAL LOW (ref 30.0–36.0)
MCV: 79.6 fL — ABNORMAL LOW (ref 80.0–100.0)
Platelets: 318 10*3/uL (ref 150–400)
RBC: 3.83 MIL/uL — ABNORMAL LOW (ref 4.22–5.81)
RDW: 19.8 % — ABNORMAL HIGH (ref 11.5–15.5)
WBC: 10.8 10*3/uL — ABNORMAL HIGH (ref 4.0–10.5)
nRBC: 0 % (ref 0.0–0.2)

## 2019-11-12 MED ORDER — METRONIDAZOLE 500 MG PO TABS
500.0000 mg | ORAL_TABLET | Freq: Three times a day (TID) | ORAL | Status: DC
  Administered 2019-11-12 – 2019-11-15 (×10): 500 mg via ORAL
  Filled 2019-11-12 (×11): qty 1

## 2019-11-12 MED ORDER — CHLORHEXIDINE GLUCONATE CLOTH 2 % EX PADS
6.0000 | MEDICATED_PAD | Freq: Every day | CUTANEOUS | Status: DC
  Administered 2019-11-12 – 2019-11-17 (×6): 6 via TOPICAL

## 2019-11-12 MED ORDER — SODIUM CHLORIDE 0.9 % IV SOLN
2.0000 g | Freq: Three times a day (TID) | INTRAVENOUS | Status: DC
  Administered 2019-11-12 – 2019-11-15 (×10): 2 g via INTRAVENOUS
  Filled 2019-11-12 (×11): qty 2

## 2019-11-12 MED ORDER — FUROSEMIDE 10 MG/ML IJ SOLN
40.0000 mg | Freq: Every day | INTRAMUSCULAR | Status: DC
  Filled 2019-11-12: qty 4

## 2019-11-12 MED ORDER — GUAIFENESIN ER 600 MG PO TB12
600.0000 mg | ORAL_TABLET | Freq: Two times a day (BID) | ORAL | Status: DC
  Administered 2019-11-12 – 2019-11-20 (×17): 600 mg via ORAL
  Filled 2019-11-12 (×17): qty 1

## 2019-11-12 MED ORDER — ENSURE ENLIVE PO LIQD
237.0000 mL | Freq: Three times a day (TID) | ORAL | Status: DC
  Administered 2019-11-12 – 2019-11-17 (×11): 237 mL via ORAL

## 2019-11-12 MED ORDER — ARFORMOTEROL TARTRATE 15 MCG/2ML IN NEBU
15.0000 ug | INHALATION_SOLUTION | Freq: Two times a day (BID) | RESPIRATORY_TRACT | Status: DC
  Administered 2019-11-12 – 2019-11-20 (×15): 15 ug via RESPIRATORY_TRACT
  Filled 2019-11-12 (×18): qty 2

## 2019-11-12 MED ORDER — FUROSEMIDE 10 MG/ML IJ SOLN
40.0000 mg | Freq: Once | INTRAMUSCULAR | Status: AC
  Administered 2019-11-12: 40 mg via INTRAVENOUS
  Filled 2019-11-12: qty 4

## 2019-11-12 NOTE — Progress Notes (Signed)
Case Discussed with Dr Priscella Mann Patient with increased SOB and hypoxia CXR with progressive lower lobe predominant infiltrates from 2 days ago CT chest 11/07/19 shows lower lobe ILD c/w fibrosis COVID + 2 months ago  Dx most likely related to ILD with pulm edema in setting of morbid obesity with Hypoventilation syndrome and OSA, need to assess systolic and diastolic cardiac dysfunction Pneumonia less likely as patient had NL WBC  And no fevers Post COVID PNEUMONITIS ALSO POSSIBILITY  Recommend ECHO Recommend LASIX as tolerated, if CO2 increases, recommend diamox Recommend Nocturnal CPAP Recommend BD therapy  Agree with Palliative care, patient is DNR   Jerry [name], M.D.  [facility]  Medical Director [place] Director [facility] Cardio-Pulmonary Department

## 2019-11-12 NOTE — TOC Progression Note (Signed)
Transition of Care [facility]) - Progression Note    Patient Details  Name: Jerry [name] MRN: [id number] Date of Birth: [birth year]  Transition of Care [facility]) CM/SW Contact  Beverly Sessions, RN Phone Number: 11/12/2019, 3:57 PM  Clinical Narrative:    MD notified if patient discharges today his covid test is still valid. If not he will need a repeat within 4 days of discharge    Expected Discharge Plan: Long Term Nursing Home Barriers to Discharge: Continued Medical Work up  Expected Discharge Plan and Services Expected Discharge Plan: [place] In-house Referral: Clinical Social Work     Living arrangements for the past 2 months: [place] Expected Discharge Date: 11/08/19                                     Social Determinants of Health (SDOH) Interventions    Readmission Risk Interventions Readmission Risk Prevention Plan 11/03/2019  Transportation Screening Complete  HRI or [place] Not Complete  HRI or Home Care Consult comments Pt lives at a [place] for [place] Planning/Counseling [place] Not Applicable  Medication Review Press photographer) Complete  Some recent data might be hidden

## 2019-11-12 NOTE — Progress Notes (Signed)
Placed pt on automated CPAP. Pt stated it was too much and he could not wear it . Turned the pressure down to 6, pt still yelling it's too much and can not wear it. Cpap remains at bedside. RN aware of pt refusal.

## 2019-11-12 NOTE — Progress Notes (Signed)
[place]  [phone] [phone]   Name: Jerry [name] Date: 11/12/2019 MRN: [id number]  DOB: May 03, 1956  Patient Care Team: [name], MD as PCP - General (Family Medicine) Jason Coop, NP as Nurse Practitioner (Hospice and Palliative Medicine)    REASON FOR CONSULTATION: Palliative Care consult requested for this 63 y.o. male with multiple medical problems including morbid obesity, schizophrenia, severe RA on Plaquenil with multiple contractures, diastolic dysfunction with history of recurrent CHF, O2 dependent COPD, chronic anemia and thrombocytopenia, who presented to the ER on 10/30/2019 from the [facility] with symptomatic anemia.  Patient was also found to have heme positive stools.  Patient underwent colonoscopy and was found to have a nonobstructing mass in the ascending colon highly concerning for malignancy.  Biopsy pending.  Palliative care was consulted to help address goals and manage ongoing symptoms.   CODE STATUS: DNR  PAST MEDICAL HISTORY: Past Medical History:  Diagnosis Date  . BPH (benign prostatic hyperplasia)   . CHF (congestive heart failure) ([place])   . Chronic pain   . COPD (chronic obstructive pulmonary disease) ([place])   . Hypertension   . Morbid obesity ([place])   . Pressure ulcer   . Rheumatoid arthritis ([place])   . Schizophrenia ([place])   . Thyroid disease     PAST SURGICAL HISTORY:  Past Surgical History:  Procedure Laterality Date  . COLONOSCOPY WITH PROPOFOL N/A 11/02/2019   Procedure: COLONOSCOPY WITH PROPOFOL;  Surgeon: Lucilla Lame, MD;  Location: [facility] ENDOSCOPY;  Service: Endoscopy;  Laterality: N/A;  . COLONOSCOPY WITH PROPOFOL N/A 11/04/2019   Procedure: COLONOSCOPY WITH PROPOFOL;  Surgeon: Jonathon Bellows, MD;  Location: [facility] ENDOSCOPY;  Service: Gastroenterology;  Laterality: N/A;  . ESOPHAGOGASTRODUODENOSCOPY (EGD) WITH PROPOFOL N/A 10/31/2019   Procedure:  ESOPHAGOGASTRODUODENOSCOPY (EGD) WITH PROPOFOL;  Surgeon: Virgel Manifold, MD;  Location: [facility] ENDOSCOPY;  Service: Endoscopy;  Laterality: N/A;    HEMATOLOGY/ONCOLOGY HISTORY:  Oncology History   No history exists.    ALLERGIES:  is allergic to methadone; penicillins; propoxyphene; sulfa antibiotics; and valproic acid and related.  MEDICATIONS:  Current Facility-Administered Medications  Medication Dose Route Frequency Provider Last Rate Last Admin  . 0.9 %  sodium chloride infusion   Intravenous Continuous Jonathon Bellows, MD 10 mL/hr at 11/12/19 1440 10 mL at 11/12/19 1440  . acetaminophen (TYLENOL) tablet 650 mg  650 mg Oral Q6H PRN Lucilla Lame, MD   650 mg at 11/12/19 1245   Or  . acetaminophen (TYLENOL) suppository 650 mg  650 mg Rectal Q6H PRN Lucilla Lame, MD      . albuterol (PROVENTIL) (2.5 MG/3ML) 0.083% nebulizer solution 2.5 mg  2.5 mg Inhalation Q3H PRN Lucilla Lame, MD   2.5 mg at 11/12/19 1244  . arformoterol (BROVANA) nebulizer solution 15 mcg  15 mcg Nebulization BID Ralene Muskrat B, MD   15 mcg at 11/12/19 1438  . benztropine (COGENTIN) tablet 0.5 mg  0.5 mg Oral BID Lucilla Lame, MD   0.5 mg at 11/12/19 0925  . budesonide (PULMICORT) nebulizer solution 0.25 mg  0.25 mg Nebulization BID Kathie Dike, MD   0.25 mg at 11/12/19 0753  . ceFEPIme (MAXIPIME) 2 g in sodium chloride 0.9 % 100 mL IVPB  2 g Intravenous Q8H Sreenath, Sudheer B, MD 200 mL/hr at 11/12/19 1446 2 g at 11/12/19 1446  . docusate sodium (COLACE) capsule 100 mg  100 mg Oral BID Lucilla Lame, MD   100 mg at 11/11/19  2145  . feeding supplement (ENSURE ENLIVE) (ENSURE ENLIVE) liquid 237 mL  237 mL Oral TID BM Sreenath, Sudheer B, MD      . ferrous sulfate tablet 325 mg  325 mg Oral BID WC Lucilla Lame, MD   325 mg at 11/12/19 0925  . fluPHENAZine (PROLIXIN) tablet 15 mg  15 mg Oral QHS Lucilla Lame, MD   15 mg at 11/11/19 2146  . fluPHENAZine (PROLIXIN) tablet 15 mg  15 mg Oral Daily Lucilla Lame, MD    15 mg at 11/12/19 0924  . guaiFENesin (MUCINEX) 12 hr tablet 600 mg  600 mg Oral BID Ralene Muskrat B, MD   600 mg at 11/12/19 1447  . hydrALAZINE (APRESOLINE) tablet 25 mg  25 mg Oral Q6H PRN Kathie Dike, MD   25 mg at 11/09/19 1701  . hydrocortisone (CORTEF) tablet 10 mg  10 mg Oral BID Lucilla Lame, MD   10 mg at 11/12/19 0924  . hydroxychloroquine (PLAQUENIL) tablet 200 mg  200 mg Oral BID Lucilla Lame, MD   200 mg at 11/12/19 0925  . ipratropium-albuterol (DUONEB) 0.5-2.5 (3) MG/3ML nebulizer solution 3 mL  3 mL Nebulization Q6H Kathie Dike, MD   3 mL at 11/12/19 0753  . levothyroxine (SYNTHROID) tablet 200 mcg  200 mcg Oral [id number] Kathie Dike, MD   200 mcg at 11/12/19 0539  . LORazepam (ATIVAN) tablet 2 mg  2 mg Oral Q6H PRN Lucilla Lame, MD   2 mg at 11/11/19 2032  . metoprolol tartrate (LOPRESSOR) tablet 50 mg  50 mg Oral BID Lucilla Lame, MD   50 mg at 11/12/19 0926  . metroNIDAZOLE (FLAGYL) tablet 500 mg  500 mg Oral Q8H Sreenath, Sudheer B, MD   500 mg at 11/12/19 1412  . multivitamin with minerals tablet 1 tablet  1 tablet Oral Daily Lucilla Lame, MD   1 tablet at 11/12/19 0925  . ondansetron (ZOFRAN) tablet 4 mg  4 mg Oral Q6H PRN Lucilla Lame, MD       Or  . ondansetron (ZOFRAN) injection 4 mg  4 mg Intravenous Q6H PRN Lucilla Lame, MD      . pantoprazole (PROTONIX) EC tablet 40 mg  40 mg Oral Daily Lucilla Lame, MD   40 mg at 11/12/19 0925  . polyethylene glycol (MIRALAX / GLYCOLAX) packet 17 g  17 g Oral Daily PRN Lucilla Lame, MD      . polyethylene glycol (MIRALAX / GLYCOLAX) packet 17 g  17 g Oral Daily Lucilla Lame, MD   17 g at 11/06/19 [id number]  . senna (SENOKOT) tablet 8.6 mg  1 tablet Oral BID Lucilla Lame, MD   8.6 mg at 11/11/19 2145  . sodium chloride flush (NS) 0.9 % injection 10-40 mL  10-40 mL Intracatheter Q12H Fritzi Mandes, MD   10 mL at 11/12/19 0947  . sodium chloride flush (NS) 0.9 % injection 10-40 mL  10-40 mL Intracatheter PRN Fritzi Mandes, MD      .  tamsulosin [facility]) capsule 0.4 mg  0.4 mg Oral Daily Lucilla Lame, MD   0.4 mg at 11/12/19 0925  . traMADol (ULTRAM) tablet 50 mg  50 mg Oral Q6H PRN Lucilla Lame, MD   50 mg at 11/10/19 1231  . vitamin C (ASCORBIC ACID) tablet 500 mg  500 mg Oral BID Lucilla Lame, MD   500 mg at 11/12/19 0925    VITAL SIGNS: BP 129/60 (BP Location: Left Arm)   Pulse (!) 123   Temp  99 F (37.2 C) (Oral)   Resp (!) 30   Ht 6\' 2"  (1.88 m)   Wt (!) 346 lb 6.4 oz (157.1 kg)   SpO2 98%   BMI 44.48 kg/m  Filed Weights   10/31/19 1325 11/01/19 2336 11/04/19 1032  Weight: (!) 338 lb (153.3 kg) (!) 346 lb 6.4 oz (157.1 kg) (!) 346 lb 6.4 oz (157.1 kg)    Estimated body mass index is 44.48 kg/m as calculated from the following:   Height as of this encounter: 6\' 2"  (1.88 m).   Weight as of this encounter: 346 lb 6.4 oz (157.1 kg).  LABS: CBC:    Component Value Date/Time   WBC 10.8 (H) 11/12/2019 0625   HGB 9.1 (L) 11/12/2019 0625   HGB 11.2 (L) 01/19/2013 1345   HCT 30.5 (L) 11/12/2019 0625   HCT 34.0 (L) 01/19/2013 1345   PLT 318 11/12/2019 0625   PLT 334 01/19/2013 1345   MCV 79.6 (L) 11/12/2019 0625   MCV 88 01/19/2013 1345   NEUTROABS 3.1 11/05/2019 0428   NEUTROABS 4.6 01/19/2013 1345   LYMPHSABS 0.9 11/05/2019 0428   LYMPHSABS 1.3 01/19/2013 1345   MONOABS 0.4 11/05/2019 0428   MONOABS 0.5 01/19/2013 1345   EOSABS 0.3 11/05/2019 0428   EOSABS 0.2 01/19/2013 1345   BASOSABS 0.0 11/05/2019 0428   BASOSABS 0.1 01/19/2013 1345   Comprehensive Metabolic Panel:    Component Value Date/Time   NA 134 (L) 11/12/2019 0625   NA 140 01/05/2013 1149   K 4.3 11/12/2019 0625   K 3.8 01/05/2013 1149   CL 95 (L) 11/12/2019 0625   CL 105 01/05/2013 1149   CO2 27 11/12/2019 0625   CO2 28 01/05/2013 1149   BUN 19 11/12/2019 0625   BUN 15 01/05/2013 1149   CREATININE 0.64 11/12/2019 0625   CREATININE 0.75 01/05/2013 1149   GLUCOSE 90 11/12/2019 0625   GLUCOSE 114 (H) 01/05/2013 1149    CALCIUM 8.6 (L) 11/12/2019 0625   CALCIUM 8.5 01/05/2013 1149   AST 22 09/14/2019 0123   AST 20 01/05/2013 1149   ALT 17 09/14/2019 0123   ALT 9 (L) 01/05/2013 1149   ALKPHOS 77 09/14/2019 0123   ALKPHOS 79 01/05/2013 1149   BILITOT 0.6 09/14/2019 0123   BILITOT 0.6 01/05/2013 1149   PROT 6.6 09/14/2019 0123   PROT 8.1 01/05/2013 1149   ALBUMIN 2.3 (L) 11/07/2019 0618   ALBUMIN 2.9 (L) 01/05/2013 1149    RADIOGRAPHIC STUDIES: CT CHEST W CONTRAST  Result Date: 11/07/2019 CLINICAL DATA:  Staging for colorectal carcinoma. Colon mass seen on colonoscopy on 11/02/2019. EXAM: CT CHEST, ABDOMEN, AND PELVIS WITH CONTRAST TECHNIQUE: Multidetector CT imaging of the chest, abdomen and pelvis was performed following the standard protocol during bolus administration of intravenous contrast. CONTRAST:  166mL OMNIPAQUE IOHEXOL 300 MG/ML  SOLN COMPARISON:  None. FINDINGS: CT CHEST FINDINGS Cardiovascular: Heart is normal size. No pericardial effusion. Three-vessel coronary artery calcifications. Great vessels are normal in caliber. Mild aortic atherosclerosis. Mediastinum/Nodes: No neck base, axillary, mediastinal or hilar masses or enlarged lymph nodes. Trachea and esophagus are unremarkable. Lungs/Pleura: No lung mass or nodule. Bilateral areas of bronchiectasis, irregular interstitial thickening and architectural distortion with honeycombing noted in the posterior inferior lower lobes and, to lesser degree, left upper lobe lingula. Findings consistent with interstitial fibrosis, which was present at the lung bases on the prior CT. No evidence of pneumonia or pulmonary edema. No pleural effusion or pneumothorax. Musculoskeletal: No fracture or  acute finding. No osteoblastic or osteolytic lesions. CT ABDOMEN PELVIS FINDINGS Hepatobiliary: Somewhat limited due to artifact from the overlying left arm. Liver normal in size. No mass or focal lesion. Subtle dependent gallstones. No gallbladder wall thickening or  evidence of acute cholecystitis. No bile duct dilation. Pancreas: Unremarkable. No pancreatic ductal dilatation or surrounding inflammatory changes. Spleen: Normal in size without focal abnormality. Adrenals/Urinary Tract: 14 mm left adrenal nodule, unchanged compared to the prior CT. Normal right adrenal gland. Kidneys normal in size, orientation and position with symmetric enhancement and excretion. No masses, stones or hydronephrosis. Normal ureters. Normal bladder. Stomach/Bowel: Colon is normal in caliber. The colon mass noted on the recent colonoscopy is not defined on CT. No wall thickening or inflammation. Stomach is unremarkable. Small bowel is normal in caliber with no wall thickening or inflammation. No evidence of appendicitis. Appendix not definitively seen. Vascular/Lymphatic: Mild aortic atherosclerosis. No aneurysm. No enlarged lymph nodes. Reproductive: Prostate not enlarged. Other: Small umbilical hernia. No ascites. There is subcutaneous soft tissue edema evident along right lateral thigh to right pelvis. Musculoskeletal: Chronic fracture of the left femoral neck with fragmentation of the femoral head, widening of the acetabula and acetabular protrusio. There are advanced right hip joint arthropathic changes. These findings are chronic. There are no acute fractures. No osteoblastic or osteolytic lesions. IMPRESSION: 1. No evidence of locally invasive colorectal carcinoma or of metastatic disease. 2. The colon mass noted on the recent colonoscopy is not visualized on CT. 3. No acute abnormalities within the chest, abdomen or pelvis. 4. Coronary artery calcifications and mild aortic atherosclerosis. 5. Lungs demonstrate changes of interstitial fibrosis. 6. Small dependent gallstones, but no evidence of acute cholecystitis. 7. Marked arthropathic changes the hips, also noted of the shoulders. Findings consistent with rheumatoid arthritis. Electronically Signed   By: Lajean Manes M.D.   On:  11/07/2019 20:00   CT ABDOMEN PELVIS W CONTRAST  Result Date: 11/07/2019 CLINICAL DATA:  Staging for colorectal carcinoma. Colon mass seen on colonoscopy on 11/02/2019. EXAM: CT CHEST, ABDOMEN, AND PELVIS WITH CONTRAST TECHNIQUE: Multidetector CT imaging of the chest, abdomen and pelvis was performed following the standard protocol during bolus administration of intravenous contrast. CONTRAST:  168mL OMNIPAQUE IOHEXOL 300 MG/ML  SOLN COMPARISON:  None. FINDINGS: CT CHEST FINDINGS Cardiovascular: Heart is normal size. No pericardial effusion. Three-vessel coronary artery calcifications. Great vessels are normal in caliber. Mild aortic atherosclerosis. Mediastinum/Nodes: No neck base, axillary, mediastinal or hilar masses or enlarged lymph nodes. Trachea and esophagus are unremarkable. Lungs/Pleura: No lung mass or nodule. Bilateral areas of bronchiectasis, irregular interstitial thickening and architectural distortion with honeycombing noted in the posterior inferior lower lobes and, to lesser degree, left upper lobe lingula. Findings consistent with interstitial fibrosis, which was present at the lung bases on the prior CT. No evidence of pneumonia or pulmonary edema. No pleural effusion or pneumothorax. Musculoskeletal: No fracture or acute finding. No osteoblastic or osteolytic lesions. CT ABDOMEN PELVIS FINDINGS Hepatobiliary: Somewhat limited due to artifact from the overlying left arm. Liver normal in size. No mass or focal lesion. Subtle dependent gallstones. No gallbladder wall thickening or evidence of acute cholecystitis. No bile duct dilation. Pancreas: Unremarkable. No pancreatic ductal dilatation or surrounding inflammatory changes. Spleen: Normal in size without focal abnormality. Adrenals/Urinary Tract: 14 mm left adrenal nodule, unchanged compared to the prior CT. Normal right adrenal gland. Kidneys normal in size, orientation and position with symmetric enhancement and excretion. No masses,  stones or hydronephrosis. Normal ureters. Normal bladder. Stomach/Bowel: Colon  is normal in caliber. The colon mass noted on the recent colonoscopy is not defined on CT. No wall thickening or inflammation. Stomach is unremarkable. Small bowel is normal in caliber with no wall thickening or inflammation. No evidence of appendicitis. Appendix not definitively seen. Vascular/Lymphatic: Mild aortic atherosclerosis. No aneurysm. No enlarged lymph nodes. Reproductive: Prostate not enlarged. Other: Small umbilical hernia. No ascites. There is subcutaneous soft tissue edema evident along right lateral thigh to right pelvis. Musculoskeletal: Chronic fracture of the left femoral neck with fragmentation of the femoral head, widening of the acetabula and acetabular protrusio. There are advanced right hip joint arthropathic changes. These findings are chronic. There are no acute fractures. No osteoblastic or osteolytic lesions. IMPRESSION: 1. No evidence of locally invasive colorectal carcinoma or of metastatic disease. 2. The colon mass noted on the recent colonoscopy is not visualized on CT. 3. No acute abnormalities within the chest, abdomen or pelvis. 4. Coronary artery calcifications and mild aortic atherosclerosis. 5. Lungs demonstrate changes of interstitial fibrosis. 6. Small dependent gallstones, but no evidence of acute cholecystitis. 7. Marked arthropathic changes the hips, also noted of the shoulders. Findings consistent with rheumatoid arthritis. Electronically Signed   By: Lajean Manes M.D.   On: 11/07/2019 20:00   DG Chest Port 1 View  Result Date: 11/12/2019 CLINICAL DATA:  Pneumonia. EXAM: PORTABLE CHEST 1 VIEW COMPARISON:  November 10, 2019 FINDINGS: Bilateral pulmonary infiltrates, most focal in the bases, have significantly worsened in the interval. The infiltrate is somewhat rounded in the right base. The cardiomediastinal silhouette is stable. No pneumothorax. No other acute abnormalities. IMPRESSION:  1. Bibasilar infiltrates have significantly worsened in the interval. There is a rounded somewhat masslike component to the medial right basilar infiltrate. Recommend attention on follow-up. Alternatively, CT imaging could better evaluate. Electronically Signed   By: Dorise Bullion III M.D   On: 11/12/2019 13:28   DG Chest Port 1 View  Result Date: 11/10/2019 CLINICAL DATA:  Increased short of breath EXAM: PORTABLE CHEST 1 VIEW COMPARISON:  09/11/2019 FINDINGS: Improvement in diffuse bilateral airspace disease. Probable baseline scarring in the bases similar to 12/27/2017. No pleural effusion. Right upper lobe airspace disease is more prominent compared with 2019 and could represent pneumonia. IMPRESSION: Prominent lung markings at the bases appear chronic. Possible right upper pneumonia. Electronically Signed   By: Franchot Gallo M.D.   On: 11/10/2019 15:25    PERFORMANCE STATUS (ECOG) : 4 - Bedbound  Review of Systems Unable to complete  Physical Exam General: NAD, frail appearing, obese Pulmonary: labored, coarse respirations Abdomen: rotund GU: no suprapubic tenderness Extremities: edema Skin: no rashes Neurological: Weakness, confusion  IMPRESSION: Follow-up visit.  Patient was pending discharge back to SNF but developed dyspnea and labored breathing.  Chest x-ray concerning for right-sided pneumonia concerning for possible aspiration event.  Pulmonology saw patient today and is made recommendations for further work-up including echo.  I called and updated patient's niece/guardian.  She remains in agreement with current scope of treatment.  She verbalized an understanding that this clinical event could herald further changes or decline.  PLAN: -Continue current scope of treatment -Will follow   Time Total: 20 minutes  Visit consisted of counseling and education dealing with the complex and emotionally intense issues of symptom management and palliative care in the setting of  serious and potentially life-threatening illness.Greater than 50%  of this time was spent counseling and coordinating care related to the above assessment and plan.  Signed by: Altha Harm, PhD,  NP-C

## 2019-11-12 NOTE — Progress Notes (Signed)
PROGRESS NOTE    Jerry [name]  [id number] DOB: [birth year] DOA: 10/30/2019 PCP: [name], MD   Brief Narrative:   [name] a52 [name] a known history of morbid obesity, severe rheumatoid arthritis on plaquenilwith multiple contractures, CHF diastolic, [name], chronic anemia, chronic thrombocytopenia, chronic pain due to rheumatoid arthritis comes to the emergency room from cancer center after he was found to have hemoglobin of 6.8.  12/16: Patient found to have worsening shortness of breath associated with low-grade temperature and increasing oxygen demands.  History of congestive heart failure diastolic.  Per documentation patient was previously encephalopathic so appears to be improving slowly from the standpoint.  Assessment & Plan:   Active Problems:   Obesity, Class III, BMI 40-49.9 (morbid obesity) (HCC)   Primary adrenocortical insufficiency (HCC)   Anemia   Chronic obstructive pulmonary disease (HCC)   Schizo affective schizophrenia (HCC)   Mass of colon   Palliative care encounter   Colon adenocarcinoma ([place])   DNR (do not resuscitate)   Chronic diastolic CHF (congestive heart failure) (HCC)   Rheumatoid arthritis (HCC)   Chronic respiratory failure with hypoxia ([place])  1.Acute on chronic anemia with heme positive stools secondary to newly diagnosed colon cancer -patient s/p 2 units of blood transfusion -Hemoglobin was 6.8 on admission.  Since that time, hemoglobins have stabilized. -He is on PPI IV twice daily -G.I. consultation with Dr. Mickie Hillier appreciated -EGD-- within normal limits -colonoscopy--An ulcerated non-obstructing large mass was found in the proximal  ascending colon. The mass was partially circumferential (involving  one-half of the lumen circumference). The mass measured one cm in  length. t -Biopsy of mass confirms invasive adenocarcinoma -patient is followed closely for his anemia at  the cancer center by Dr. Rogue Bussing -Dr. Lysle Pearl from surgery consulted.  Family is unsure whether they wish to proceed with surgical resection -Palliative care consultation had been obtained, appreciate assistance -Surgery will consider outpatient Port-A-Cath placement since patient has very poor venous access.   -After discussion with oncology, staging CTs were ordered that did not show any evidence of metastatic disease. -Patient will follow-up with oncology and palliative care as an outpatient to discuss further goals of treatment  2. Acute on chronic hypoxic respiratory failure Patient is noted to have increased work of breathing associated with an increase in oxygen demand Patient intermittently becomes significantly tachycardic and has difficulty controlling his secretions burden Nebulizers seem limited in efficacy Stat bedside chest x-ray demonstrates evidence of interstitial edema more consistent with decompensated congestive heart failure rather than an infectious process\ Plan: Continue aggressive bronchodilator therapy Lasix 40 mg daily Foley for accurate ins and outs Daily weights Recheck echocardiogram as last was 2 years ago Nightly CPAP, appreciate RT assistance Continue empiric antibiotics for now given fever and suspicion of aspiration pneumonia however low suspicion to discontinue Continue incentive spirometry, flutter valve, aggressive pulmonary toileting Discussed with Dr. Mortimer Fries from pulmonary,  Recommendations greatly appreciated  3.COPD on chronic home oxygen at 2 L -continue inhalers as needed - See above  4.Chronic diastolic congestive heart failure  EF 60% by echo in 2018.   Volume status difficult to assess given body habitus Recheck echocardiogram Lasix 40 mg IV daily Continue Foley for accurate urine output assessment   5.Chronic thrombocytopenia -continue to monitor  6.BPH on Flomax  7. Rheumatoid arthritis. On chronic hydroxychloroquine and  chronic hydrocortisone.    At baseline patient is bed bound at baseline and is form a long term facility  DVT prophylaxis: SCDs  Code Status: DNR Family Communication: none today Disposition Plan: Return to long-term care facility   Consultants:   Palliative care  Procedures:   none  Antimicrobials:  Cefepime  Flagyl   Subjective: Seen and examined Encephalopathy appears to be improving however patient still markedly short of breath Audible wheezing Tachypnea  Objective: Vitals:   11/12/19 1404 11/12/19 1438 11/12/19 1455 11/12/19 1544  BP: (!) 141/99  (!) 154/101 129/60  Pulse: (!) 109  (!) 121 (!) 123  Resp: (!) 26  (!) 30 (!) 30  Temp: 99.4 F (37.4 C)  99.5 F (37.5 C) 99 F (37.2 C)  TempSrc:    Oral  SpO2: 96% 98% 100% 98%  Weight:      Height:        Intake/Output Summary (Last 24 hours) at 11/12/2019 1737 Last data filed at 11/12/2019 1500 Gross per 24 hour  Intake 210 ml  Output 200 ml  Net 10 ml   Filed Weights   10/31/19 1325 11/01/19 2336 11/04/19 1032  Weight: (!) 153.3 kg (!) 157.1 kg (!) 157.1 kg    Examination:  General exam: Alert, awake, no distress Respiratory system: Increased rhonchi and wheezing bilaterally.  Mild increase in respiratory effort. Cardiovascular system:RRR. No murmurs, rubs, gallops. Gastrointestinal system: Abdomen is nondistended, soft and nontender. No organomegaly or masses felt. Normal bowel sounds heard. Central nervous system:  No focal neurological deficits. Extremities: Changes consistent with severe rheumatoid arthritis in hands. Skin: No rashes, lesions or ulcers Psychiatry: Confused    Data Reviewed: I have personally reviewed following labs and imaging studies  CBC: Recent Labs  Lab 11/06/19 1127 11/07/19 0618 11/08/19 0521 11/11/19 0031 11/12/19 0625  WBC 6.7 5.1  --  8.9 10.8*  HGB 7.0* 7.1* 8.1* 9.0* 9.1*  HCT 24.6* 24.4* 27.3* 29.2* 30.5*  MCV 80.9 75.8*  --  76.4* 79.6*  PLT  169 272  --  301 0000000   Basic Metabolic Panel: Recent Labs  Lab 11/07/19 0618 11/10/19 1818 11/11/19 0031 11/12/19 0625  NA 140 135 137 134*  K 3.5 4.7 4.5 4.3  CL 100 100 98 95*  CO2 32 24 29 27   GLUCOSE 106* 128* 105* 90  BUN 6* 11 14 19   CREATININE 0.38* 0.54* 0.54* 0.64  CALCIUM 8.2* 8.7* 8.6* 8.6*  PHOS 3.9  --   --   --    GFR: Estimated Creatinine Clearance: 148 mL/min (by C-G formula based on SCr of 0.64 mg/dL). Liver Function Tests: Recent Labs  Lab 11/07/19 0618  ALBUMIN 2.3*   No results for input(s): LIPASE, AMYLASE in the last 168 hours. No results for input(s): AMMONIA in the last 168 hours. Coagulation Profile: No results for input(s): INR, PROTIME in the last 168 hours. Cardiac Enzymes: No results for input(s): CKTOTAL, CKMB, CKMBINDEX, TROPONINI in the last 168 hours. BNP (last 3 results) No results for input(s): PROBNP in the last 8760 hours. HbA1C: No results for input(s): HGBA1C in the last 72 hours. CBG: No results for input(s): GLUCAP in the last 168 hours. Lipid Profile: No results for input(s): CHOL, HDL, LDLCALC, TRIG, CHOLHDL, LDLDIRECT in the last 72 hours. Thyroid Function Tests: No results for input(s): TSH, T4TOTAL, FREET4, T3FREE, THYROIDAB in the last 72 hours. Anemia Panel: No results for input(s): VITAMINB12, FOLATE, FERRITIN, TIBC, IRON, RETICCTPCT in the last 72 hours. Sepsis Labs: No results for input(s): PROCALCITON, LATICACIDVEN in the last 168 hours.  Recent Results (from the past 240 hour(s))  SARS CORONAVIRUS 2 (  TAT 6-24 HRS) Nasopharyngeal Nasopharyngeal Swab     Status: None   Collection Time: 11/03/19  1:32 PM   Specimen: Nasopharyngeal Swab  Result Value Ref Range Status   SARS Coronavirus 2 NEGATIVE NEGATIVE Final    Comment: (NOTE) SARS-CoV-2 target nucleic acids are NOT DETECTED. The SARS-CoV-2 RNA is generally detectable in upper and lower respiratory specimens during the acute phase of infection.  Negative results do not preclude SARS-CoV-2 infection, do not rule out co-infections with other pathogens, and should not be used as the sole basis for treatment or other patient management decisions. Negative results must be combined with clinical observations, patient history, and epidemiological information. The expected result is Negative. Fact Sheet for Patients: SugarRoll.be Fact Sheet for Healthcare Providers: https://www.woods-mathews.com/ This test is not yet approved or cleared by the [place] FDA and  has been authorized for detection and/or diagnosis of SARS-CoV-2 by FDA under an Emergency Use Authorization (EUA). This EUA will remain  in effect (meaning this test can be used) for the duration of the COVID-19 declaration under Section 56 4(b)(1) of the Act, 21 U.S.C. section 360bbb-3(b)(1), unless the authorization is terminated or revoked sooner. Performed at [facility], [place] [address] [place], [place] [address]   SARS CORONAVIRUS 2 (TAT 6-24 HRS) Nasopharyngeal Nasopharyngeal Swab     Status: None   Collection Time: 11/08/19  1:51 PM   Specimen: Nasopharyngeal Swab  Result Value Ref Range Status   SARS Coronavirus 2 NEGATIVE NEGATIVE Final    Comment: (NOTE) SARS-CoV-2 target nucleic acids are NOT DETECTED. The SARS-CoV-2 RNA is generally detectable in upper and lower respiratory specimens during the acute phase of infection. Negative results do not preclude SARS-CoV-2 infection, do not rule out co-infections with other pathogens, and should not be used as the sole basis for treatment or other patient management decisions. Negative results must be combined with clinical observations, patient history, and epidemiological information. The expected result is Negative. Fact Sheet for Patients: SugarRoll.be Fact Sheet for Healthcare  Providers: https://www.woods-mathews.com/ This test is not yet approved or cleared by the [place] FDA and  has been authorized for detection and/or diagnosis of SARS-CoV-2 by FDA under an Emergency Use Authorization (EUA). This EUA will remain  in effect (meaning this test can be used) for the duration of the COVID-19 declaration under Section 56 4(b)(1) of the Act, 21 U.S.C. section 360bbb-3(b)(1), unless the authorization is terminated or revoked sooner. Performed at [facility], [place] [address] [place], [place] [address]          Radiology Studies: DG Chest Port 1 View  Result Date: 11/12/2019 CLINICAL DATA:  Pneumonia. EXAM: PORTABLE CHEST 1 VIEW COMPARISON:  November 10, 2019 FINDINGS: Bilateral pulmonary infiltrates, most focal in the bases, have significantly worsened in the interval. The infiltrate is somewhat rounded in the right base. The cardiomediastinal silhouette is stable. No pneumothorax. No other acute abnormalities. IMPRESSION: 1. Bibasilar infiltrates have significantly worsened in the interval. There is a rounded somewhat masslike component to the medial right basilar infiltrate. Recommend attention on follow-up. Alternatively, CT imaging could better evaluate. Electronically Signed   By: Dorise Bullion III M.D   On: 11/12/2019 13:28        Scheduled Meds: . arformoterol  15 mcg Nebulization BID  . benztropine  0.5 mg Oral BID  . budesonide (PULMICORT) nebulizer solution  0.25 mg Nebulization BID  . Chlorhexidine Gluconate Cloth  6 each Topical Daily  . docusate sodium  100 mg Oral BID  . feeding supplement (ENSURE ENLIVE)  237 mL Oral TID BM  . ferrous sulfate  325 mg Oral BID WC  . fluPHENAZine  15 mg Oral QHS  . fluPHENAZine  15 mg Oral Daily  . [START ON 11/13/2019] furosemide  40 mg Intravenous Daily  . guaiFENesin  600 mg Oral BID  . hydrocortisone  10 mg Oral BID  . hydroxychloroquine  200 mg Oral BID  .  ipratropium-albuterol  3 mL Nebulization Q6H  . levothyroxine  200 mcg Oral Q0600  . metoprolol tartrate  50 mg Oral BID  . metroNIDAZOLE  500 mg Oral Q8H  . multivitamin with minerals  1 tablet Oral Daily  . pantoprazole  40 mg Oral Daily  . polyethylene glycol  17 g Oral Daily  . senna  1 tablet Oral BID  . sodium chloride flush  10-40 mL Intracatheter Q12H  . tamsulosin  0.4 mg Oral Daily  . vitamin C  500 mg Oral BID   Continuous Infusions: . sodium chloride 10 mL (11/12/19 1440)  . ceFEPime (MAXIPIME) IV 2 g (11/12/19 1446)     LOS: 13 days    Time spent: 35 minutes    Sidney Ace, MD [facility] Pager [phone]  If 7PM-7AM, please contact night-coverage [email or web address] Password [email or web address] 11/12/2019, 5:37 PM

## 2019-11-13 ENCOUNTER — Inpatient Hospital Stay (HOSPITAL_COMMUNITY)
Admit: 2019-11-13 | Discharge: 2019-11-13 | Disposition: A | Discharge status: Home / Self Care | Payer: Medicaid Other | Attending: Internal Medicine | Admitting: Internal Medicine

## 2019-11-13 DIAGNOSIS — I5032 Chronic diastolic (congestive) heart failure: Secondary | ICD-10-CM

## 2019-11-13 LAB — BASIC METABOLIC PANEL
Anion gap: 12 (ref 5–15)
BUN: 17 mg/dL (ref 8–23)
CO2: 25 mmol/L (ref 22–32)
Calcium: 8.7 mg/dL — ABNORMAL LOW (ref 8.9–10.3)
Chloride: 98 mmol/L (ref 98–111)
Creatinine, Ser: 0.57 mg/dL — ABNORMAL LOW (ref 0.61–1.24)
GFR calc Af Amer: >60 mL/min (ref >60)
GFR calc non Af Amer: >60 mL/min (ref >60)
Glucose, Bld: 74 mg/dL (ref 70–99)
Potassium: 4 mmol/L (ref 3.5–5.1)
Sodium: 135 mmol/L (ref 135–145)

## 2019-11-13 LAB — CBC WITH DIFFERENTIAL/PLATELET
Abs Immature Granulocytes: 0.03 10*3/uL (ref 0.00–0.07)
Basophils Absolute: 0 10*3/uL (ref 0.0–0.1)
Basophils Relative: 0 %
Eosinophils Absolute: 0.3 10*3/uL (ref 0.0–0.5)
Eosinophils Relative: 3 %
HCT: 28 % — ABNORMAL LOW (ref 39.0–52.0)
Hemoglobin: 8.4 g/dL — ABNORMAL LOW (ref 13.0–17.0)
Immature Granulocytes: 0 %
Lymphocytes Relative: 11 %
Lymphs Abs: 1 10*3/uL (ref 0.7–4.0)
MCH: 23.8 pg — ABNORMAL LOW (ref 26.0–34.0)
MCHC: 30 g/dL (ref 30.0–36.0)
MCV: 79.3 fL — ABNORMAL LOW (ref 80.0–100.0)
Monocytes Absolute: 1.2 10*3/uL — ABNORMAL HIGH (ref 0.1–1.0)
Monocytes Relative: 13 %
Neutro Abs: 6.7 10*3/uL (ref 1.7–7.7)
Neutrophils Relative %: 73 %
Platelets: 285 10*3/uL (ref 150–400)
RBC: 3.53 MIL/uL — ABNORMAL LOW (ref 4.22–5.81)
RDW: 20.1 % — ABNORMAL HIGH (ref 11.5–15.5)
WBC: 9.2 10*3/uL (ref 4.0–10.5)
nRBC: 0 % (ref 0.0–0.2)

## 2019-11-13 LAB — ECHOCARDIOGRAM COMPLETE
Height: 74 in
Weight: 5542.4 oz

## 2019-11-13 MED ORDER — FUROSEMIDE 10 MG/ML IJ SOLN
40.0000 mg | Freq: Two times a day (BID) | INTRAMUSCULAR | Status: DC
  Administered 2019-11-13 – 2019-11-14 (×3): 40 mg via INTRAVENOUS
  Filled 2019-11-13 (×2): qty 4

## 2019-11-13 MED ORDER — POTASSIUM CHLORIDE CRYS ER 20 MEQ PO TBCR
40.0000 meq | EXTENDED_RELEASE_TABLET | Freq: Once | ORAL | Status: AC
  Administered 2019-11-13: 40 meq via ORAL
  Filled 2019-11-13: qty 2

## 2019-11-13 NOTE — Progress Notes (Signed)
PROGRESS NOTE    Jerry [name]  [id number] DOB: [birth year] DOA: 10/30/2019 PCP: [name], MD   Brief Narrative:   Jerry [name] a43 y.o.malewith a known history of morbid obesity, severe rheumatoid arthritis on plaquenilwith multiple contractures, CHF diastolic, [name], chronic anemia, chronic thrombocytopenia, chronic pain due to rheumatoid arthritis comes to the emergency room from cancer center after he was found to have hemoglobin of 6.8.  12/16: Patient found to have worsening shortness of breath associated with low-grade temperature and increasing oxygen demands.  History of congestive heart failure diastolic.  Per documentation patient was previously encephalopathic so appears to be improving slowly from the standpoint.  12/17: Symptomatically improved.  Diuresed appropriately.  Respiratory rate improved.  On 2 and half to 3 L nasal cannula.  Blood pressure improved.  Assessment & Plan:   Active Problems:   Obesity, Class III, BMI 40-49.9 (morbid obesity) (HCC)   Primary adrenocortical insufficiency (HCC)   Anemia   Chronic obstructive pulmonary disease (HCC)   Schizo affective schizophrenia (HCC)   Mass of colon   Palliative care encounter   Colon adenocarcinoma ([place])   DNR (do not resuscitate)   Chronic diastolic CHF (congestive heart failure) (HCC)   Rheumatoid arthritis (HCC)   Chronic respiratory failure with hypoxia (HCC)  Acute on chronic hypoxic respiratory failure Patient is noted to have increased work of breathing associated with an increase in oxygen demand Patient intermittently becomes significantly tachycardic and has difficulty controlling his secretions burden Nebulizers seem limited in efficacy Stat bedside chest x-ray demonstrates evidence of interstitial edema more consistent with decompensated congestive heart failure rather than an infectious process Patient seemed to respond well to the diuresis Patient is refusing CPAP  mask at night, apparently unable to tolerate Plan: Continue aggressive bronchodilator therapy Increase Lasix to 40mg  BID Continue Foley for accurate ins and outs Daily weights Recheck echocardiogram as last was 2 years ago: completed, pending read Nightly CPAP, appreciate RT assistance- Patient refusing Continue empiric antibiotics for now given fever and suspicion of aspiration pneumonia however low suspicion to discontinue Continue incentive spirometry, flutter valve, aggressive pulmonary toileting    Acute on chronic anemia with heme positive stools secondary to newly diagnosed colon cancer -patient s/p 2 units of blood transfusion -Hemoglobin was 6.8 on admission.  Since that time, hemoglobins have stabilized. -He is on PPI IV twice daily -G.I. consultation with Dr. Mickie Hillier appreciated -EGD-- within normal limits -colonoscopy--An ulcerated non-obstructing large mass was found in the proximal  ascending colon. The mass was partially circumferential (involving  one-half of the lumen circumference). The mass measured one cm in  length. t -Biopsy of mass confirms invasive adenocarcinoma -patient is followed closely for his anemia at the cancer center by Dr. Rogue Bussing -Dr. Lysle Pearl from surgery consulted.  Family is unsure whether they wish to proceed with surgical resection -Palliative care consultation had been obtained, appreciate assistance -Surgery will consider outpatient Port-A-Cath placement since patient has very poor venous access.   -After discussion with oncology, staging CTs were ordered that did not show any evidence of metastatic disease. -Patient will follow-up with oncology and palliative care as an outpatient to discuss further goals of treatment   COPD on chronic home oxygen at 2 L -continue inhalers as needed - See above  Chronic diastolic congestive heart failure  EF 60% by echo in 2018.   Volume status difficult to assess given body  habitus Recheck echocardiogram Lasix 40 mg IV daily Continue Foley for accurate urine output assessment  Chronic thrombocytopenia -continue to monitor  BPH on Flomax  Rheumatoid arthritis. On chronic hydroxychloroquine and chronic hydrocortisone.    At baseline patient is bed bound at baseline and is from a long term facility  DVT prophylaxis: SCDs Code Status: DNR Family Communication: none today Disposition Plan: Return to long-term care facility   Consultants:   Palliative care  Procedures:   none  Antimicrobials:  Cefepime  Flagyl   Subjective: Seen and examined Encephalopathy appears to be improving however patient still markedly short of breath Audible wheezing Tachypnea  Objective: Vitals:   11/13/19 0740 11/13/19 0942 11/13/19 1025 11/13/19 1311  BP:  112/71 (!) 146/91   Pulse:  (!) 113 (!) 102   Resp:  16    Temp:      TempSrc:      SpO2: 95% 96%  95%  Weight:      Height:        Intake/Output Summary (Last 24 hours) at 11/13/2019 1421 Last data filed at 11/13/2019 1012 Gross per 24 hour  Intake 400 ml  Output 1400 ml  Net -1000 ml   Filed Weights   10/31/19 1325 11/01/19 2336 11/04/19 1032  Weight: (!) 153.3 kg (!) 157.1 kg (!) 157.1 kg    Examination:  General exam: Alert, awake, no distress Respiratory system: Increased rhonchi and wheezing bilaterally.  Mild increase in respiratory effort. Cardiovascular system:RRR. No murmurs, rubs, gallops. Gastrointestinal system: Abdomen is nondistended, soft and nontender. No organomegaly or masses felt. Normal bowel sounds heard. Central nervous system:  No focal neurological deficits. Extremities: Changes consistent with severe rheumatoid arthritis in hands. Skin: No rashes, lesions or ulcers Psychiatry: Confused    Data Reviewed: I have personally reviewed following labs and imaging studies  CBC: Recent Labs  Lab 11/07/19 0618 11/08/19 0521 11/11/19 0031 11/12/19 0625  11/13/19 0923  WBC 5.1  --  8.9 10.8* 9.2  NEUTROABS  --   --   --   --  6.7  HGB 7.1* 8.1* 9.0* 9.1* 8.4*  HCT 24.4* 27.3* 29.2* 30.5* 28.0*  MCV 75.8*  --  76.4* 79.6* 79.3*  PLT 272  --  301 318 [id number]   Basic Metabolic Panel: Recent Labs  Lab 11/07/19 0618 11/10/19 1818 11/11/19 0031 11/12/19 0625 11/13/19 0923  NA 140 135 137 134* 135  K 3.5 4.7 4.5 4.3 4.0  CL 100 100 98 95* 98  CO2 32 24 29 27 25   GLUCOSE 106* 128* 105* 90 74  BUN 6* 11 14 19 17   CREATININE 0.38* 0.54* 0.54* 0.64 0.57*  CALCIUM 8.2* 8.7* 8.6* 8.6* 8.7*  PHOS 3.9  --   --   --   --    GFR: Estimated Creatinine Clearance: 148 mL/min (A) (by C-G formula based on SCr of 0.57 mg/dL (L)). Liver Function Tests: Recent Labs  Lab 11/07/19 0618  ALBUMIN 2.3*   No results for input(s): LIPASE, AMYLASE in the last 168 hours. No results for input(s): AMMONIA in the last 168 hours. Coagulation Profile: No results for input(s): INR, PROTIME in the last 168 hours. Cardiac Enzymes: No results for input(s): CKTOTAL, CKMB, CKMBINDEX, TROPONINI in the last 168 hours. BNP (last 3 results) No results for input(s): PROBNP in the last 8760 hours. HbA1C: No results for input(s): HGBA1C in the last 72 hours. CBG: No results for input(s): GLUCAP in the last 168 hours. Lipid Profile: No results for input(s): CHOL, HDL, LDLCALC, TRIG, CHOLHDL, LDLDIRECT in the last 72 hours. Thyroid Function Tests:  No results for input(s): TSH, T4TOTAL, FREET4, T3FREE, THYROIDAB in the last 72 hours. Anemia Panel: No results for input(s): VITAMINB12, FOLATE, FERRITIN, TIBC, IRON, RETICCTPCT in the last 72 hours. Sepsis Labs: No results for input(s): PROCALCITON, LATICACIDVEN in the last 168 hours.  Recent Results (from the past 240 hour(s))  SARS CORONAVIRUS 2 (TAT 6-24 HRS) Nasopharyngeal Nasopharyngeal Swab     Status: None   Collection Time: 11/08/19  1:51 PM   Specimen: Nasopharyngeal Swab  Result Value Ref Range Status   SARS  Coronavirus 2 NEGATIVE NEGATIVE Final    Comment: (NOTE) SARS-CoV-2 target nucleic acids are NOT DETECTED. The SARS-CoV-2 RNA is generally detectable in upper and lower respiratory specimens during the acute phase of infection. Negative results do not preclude SARS-CoV-2 infection, do not rule out co-infections with other pathogens, and should not be used as the sole basis for treatment or other patient management decisions. Negative results must be combined with clinical observations, patient history, and epidemiological information. The expected result is Negative. Fact Sheet for Patients: SugarRoll.be Fact Sheet for Healthcare Providers: https://www.woods-mathews.com/ This test is not yet approved or cleared by the [place] FDA and  has been authorized for detection and/or diagnosis of SARS-CoV-2 by FDA under an Emergency Use Authorization (EUA). This EUA will remain  in effect (meaning this test can be used) for the duration of the COVID-19 declaration under Section 56 4(b)(1) of the Act, 21 U.S.C. section 360bbb-3(b)(1), unless the authorization is terminated or revoked sooner. Performed at [facility], [place] [address], [place], [place] [address]          Radiology Studies: DG Chest Port 1 View  Result Date: 11/12/2019 CLINICAL DATA:  Pneumonia. EXAM: PORTABLE CHEST 1 VIEW COMPARISON:  November 10, 2019 FINDINGS: Bilateral pulmonary infiltrates, most focal in the bases, have significantly worsened in the interval. The infiltrate is somewhat rounded in the right base. The cardiomediastinal silhouette is stable. No pneumothorax. No other acute abnormalities. IMPRESSION: 1. Bibasilar infiltrates have significantly worsened in the interval. There is a rounded somewhat masslike component to the medial right basilar infiltrate. Recommend attention on follow-up. Alternatively, CT imaging could better evaluate. Electronically Signed    By: Dorise Bullion III M.D   On: 11/12/2019 13:28        Scheduled Meds: . arformoterol  15 mcg Nebulization BID  . benztropine  0.5 mg Oral BID  . budesonide (PULMICORT) nebulizer solution  0.25 mg Nebulization BID  . Chlorhexidine Gluconate Cloth  6 each Topical Daily  . docusate sodium  100 mg Oral BID  . feeding supplement (ENSURE ENLIVE)  237 mL Oral TID BM  . ferrous sulfate  325 mg Oral BID WC  . fluPHENAZine  15 mg Oral QHS  . fluPHENAZine  15 mg Oral Daily  . furosemide  40 mg Intravenous Q12H  . guaiFENesin  600 mg Oral BID  . hydrocortisone  10 mg Oral BID  . hydroxychloroquine  200 mg Oral BID  . ipratropium-albuterol  3 mL Nebulization Q6H  . levothyroxine  200 mcg Oral Q0600  . metoprolol tartrate  50 mg Oral BID  . metroNIDAZOLE  500 mg Oral Q8H  . multivitamin with minerals  1 tablet Oral Daily  . pantoprazole  40 mg Oral Daily  . polyethylene glycol  17 g Oral Daily  . potassium chloride  40 mEq Oral Once  . senna  1 tablet Oral BID  . sodium chloride flush  10-40 mL Intracatheter Q12H  .  tamsulosin  0.4 mg Oral Daily  . vitamin C  500 mg Oral BID   Continuous Infusions: . sodium chloride 10 mL (11/12/19 1440)  . ceFEPime (MAXIPIME) IV 2 g (11/13/19 0523)     LOS: 14 days    Time spent: 35 minutes    Sidney Ace, MD [facility] Pager [phone]  If 7PM-7AM, please contact night-coverage [email or web address] Password [email or web address] 11/13/2019, 2:21 PM

## 2019-11-13 NOTE — Progress Notes (Signed)
  Speech Language Pathology Treatment: Dysphagia  Patient Details Name: Jerry [name] MRN: [id number] DOB: [birth year] Today's Date: 11/13/2019 Time: [id number] SLP Time Calculation (min) (ACUTE ONLY): 32 min  Assessment / Plan / Recommendation Clinical Impression  Pt visited today for further assessment of swallowing and toleration of diet.Pt seated upright in bed. Noted SOB breathing and wheezing. Pt reported he did not want to try any foods but really wanted some ice water. Pt c/o discomfort and was repositioned with some relief. Pt tolerated a few small sips of thin water without s/s of aspiration. Noted audible congestion prior to and after PO's. Discussed possibility of downgrading foods to puree for ease of chewing and swallowing with less fatigue/ SOB. Pt stated he wanted to keep with the chopped foods and does not wish to try purees. Oral care provided at the request from Pt to have his teeth brushed. Rec. continue with current diet of Dys 2 with thin liquids. Continue to monitor closely and downgrade diet if needed.   HPI HPI: Pt is a 63 y.o. 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 He had heme positive stools in the emergency room.  Patient is a long-term resident at [facility]. At baseline he does not ambulate.  He exhibited Confusion during this evaluation -- similar was noted per ED MD notes.       SLP Plan  Continue with current plan of care       Recommendations  Diet recommendations: Dysphagia 2 (fine chop) Liquids provided via: Cup Supervision: Trained caregiver to feed patient Compensations: Small sips/bites;Slow rate Postural Changes and/or Swallow Maneuvers: Upright 30-60 min  after meal;Seated upright 90 degrees(may need to add pillows behind Pts head for 90 degree head )                Oral Care Recommendations: Oral care BID;Oral care before and after PO;Staff/trained caregiver to provide oral care Follow up Recommendations: Skilled Nursing facility SLP Visit Diagnosis: Dysphagia, oropharyngeal phase (R13.12) Plan: Continue with current plan of care       GO                Jerry [name] 11/13/2019, 11:34 AM

## 2019-11-13 NOTE — Progress Notes (Signed)
Initial Nutrition Assessment  DOCUMENTATION CODES:   Morbid obesity  INTERVENTION:   Ensure Enlive po TID, each supplement provides 350 kcal and 20 grams of protein  Magic cup TID with meals, each supplement provides 290 kcal and 9 grams of protein  MVI daily   NUTRITION DIAGNOSIS:   Increased nutrient needs related to chronic illness(COPD, CHF, colon cancer) as evidenced by increased estimated needs.  GOAL:   Patient will meet greater than or equal to 90% of their needs  MONITOR:   PO intake, Supplement acceptance, Labs, Weight trends, Skin, I & O's  REASON FOR ASSESSMENT:   LOS    ASSESSMENT:   63 y/o male with h/o morbid obesity, schizophrenia, severe RA on Plaquenil with multiple contractures, diastolic dysfunction with history of recurrent CHF, O2 dependent COPD, chronic anemia and thrombocytopenia, who presented to the ER on 10/30/2019 from the [facility] with symptomatic anemia.  Patient was also found to have heme positive stools.  Patient underwent colonoscopy and was found to have a nonobstructing mass in the ascending colon highly concerning for malignancy.   Pt with fair appetite and oral intake since admit; pt eating anywhere from sips and bites to 100% of meals. Pt also drinking some Ensure. RD will add supplements and MVI to help pt meet his estimated needs. Patient was pending discharge back to SNF but developed dyspnea and labored breathing.  Chest x-ray concerning for right-sided pneumonia concerning for possible aspiration event. Pt seen by SLP and placed on a dysphagia 2/thin liquid diet. Palliative care following for GOC.   Per chart, pt with weight gain pta. Pt currently up ~8lbs since admit.  Medications reviewed and include: colace, ferrous sulfate, synthroid, MVI, protonix, miralax, KCl, senokot, vitamin C, cefepime    Labs reviewed: creat 0.57(L) Hgb 8.4(L), Hct 28.0(L), MCV 79.3(L), 23.8(L)  NUTRITION - FOCUSED PHYSICAL EXAM:    Most Recent  Value  Orbital Region  No depletion  Upper Arm Region  No depletion  Thoracic and Lumbar Region  No depletion  Buccal Region  No depletion  Temple Region  No depletion  Clavicle Bone Region  No depletion  Clavicle and Acromion Bone Region  No depletion  Scapular Bone Region  No depletion  Dorsal Hand  No depletion  Patellar Region  No depletion  Anterior Thigh Region  No depletion  Posterior Calf Region  No depletion  Edema (RD Assessment)  Moderate  Hair  Reviewed  Eyes  Reviewed  Mouth  Reviewed  Skin  Reviewed  Nails  Reviewed     Diet Order:   Diet Order            DIET DYS 2 Room service appropriate? Yes with Assist; Fluid consistency: Thin  Diet effective now        Diet - low sodium heart healthy             EDUCATION NEEDS:   Education needs have been addressed  Skin:  Skin Assessment: Reviewed RN Assessment  Last BM:  12/10  Height:   Ht Readings from Last 1 Encounters:  11/04/19 6\' 2"  (1.88 m)    Weight:   Wt Readings from Last 1 Encounters:  11/04/19 (!) 157.1 kg    Ideal Body Weight:  86.3 kg  BMI:  Body mass index is 44.48 kg/m.  Estimated Nutritional Needs:   Kcal:  2700-2000kcal/day  Protein:  >130g/day  Fluid:  >2.5L/day  Koleen Distance MS, RD, LDN Pager #- [phone] Office#- [phone] After Hours  Pager: [phone]  

## 2019-11-13 NOTE — Consult Note (Signed)
PHARMACY CONSULT NOTE  Pharmacy Consult for Electrolyte Monitoring and Replacement   Recent Labs: Potassium (mmol/L)  Date Value  11/13/2019 4.0  01/05/2013 3.8   Magnesium (mg/dL)  Date Value  09/14/2019 2.1   Calcium (mg/dL)  Date Value  11/13/2019 8.7 (L)   Calcium, Total (mg/dL)  Date Value  01/05/2013 8.5   Albumin (g/dL)  Date Value  11/07/2019 2.3 (L)  01/05/2013 2.9 (L)   Phosphorus (mg/dL)  Date Value  11/07/2019 3.9   Sodium (mmol/L)  Date Value  11/13/2019 135  01/05/2013 140   Corrected Ca: 10.1 mg/dL  Assessment: 63 y.o.malewith a known history of morbid obesity, severe rheumatoid arthritis on plaquenilwith multiple contractures, CHF diastolic, [name], chronic anemia, chronic thrombocytopenia, chronic pain due to rheumatoid arthritis admitted with anemia. Furosemide 40 mg IV daily started 12/17  Goal of Therapy Given Cardiac History:  Potassium 4.0 - 5.1 mmol/L Magnesium 2.0 - 2.4 mg/dL All Other Electrolytes WNL  Plan:   Electrolytes at goal but potassium is trending down following a single dose of furosemide 40 mg: oral KCl 40 mEq x1  BMP, magnesium in am  [name] ,PharmD Clinical Pharmacist 11/13/2019 10:20 AM

## 2019-11-13 NOTE — Progress Notes (Signed)
*  PRELIMINARY RESULTS* Echocardiogram 2D Echocardiogram has been performed.  Sherrie Sport 11/13/2019, 9:09 AM

## 2019-11-14 LAB — BASIC METABOLIC PANEL
Anion gap: 10 (ref 5–15)
BUN: 16 mg/dL (ref 8–23)
CO2: 28 mmol/L (ref 22–32)
Calcium: 8.5 mg/dL — ABNORMAL LOW (ref 8.9–10.3)
Chloride: 98 mmol/L (ref 98–111)
Creatinine, Ser: 0.58 mg/dL — ABNORMAL LOW (ref 0.61–1.24)
GFR calc Af Amer: >60 mL/min (ref >60)
GFR calc non Af Amer: >60 mL/min (ref >60)
Glucose, Bld: 114 mg/dL — ABNORMAL HIGH (ref 70–99)
Potassium: 4.2 mmol/L (ref 3.5–5.1)
Sodium: 136 mmol/L (ref 135–145)

## 2019-11-14 LAB — MAGNESIUM: Magnesium: 2.3 mg/dL (ref 1.7–2.4)

## 2019-11-14 MED ORDER — FUROSEMIDE 10 MG/ML IJ SOLN
40.0000 mg | Freq: Three times a day (TID) | INTRAMUSCULAR | Status: DC
  Administered 2019-11-14 – 2019-11-18 (×12): 40 mg via INTRAVENOUS
  Filled 2019-11-14 (×12): qty 4

## 2019-11-14 MED ORDER — FUROSEMIDE 10 MG/ML IJ SOLN
40.0000 mg | Freq: Once | INTRAMUSCULAR | Status: AC
  Administered 2019-11-14: 18:00:00 40 mg via INTRAVENOUS
  Filled 2019-11-14: qty 4

## 2019-11-14 NOTE — Progress Notes (Signed)
PROGRESS NOTE    Jerry [name]  [id number] DOB: 1956-05-08 DOA: 10/30/2063 PCP: [name], MD   Brief Narrative:   [name] a63 y.o.malewith a known history of morbid obesity, severe rheumatoid arthritis on plaquenilwith multiple contractures, CHF diastolic, [name], chronic anemia, chronic thrombocytopenia, chronic pain due to rheumatoid arthritis comes to the emergency room from cancer center after he was found to have hemoglobin of 6.8.  12/16: Patient found to have worsening shortness of breath associated with low-grade temperature and increasing oxygen demands.  History of congestive heart failure diastolic.  Per documentation patient was previously encephalopathic so appears to be improving slowly from the standpoint.  12/17: Symptomatically improved.  Diuresed appropriately.  Respiratory rate improved.  On 2 and half to 3 L nasal cannula.  Blood pressure improved.  12/18: Diuresing well.  2 to 3 L nasal cannula.  Respiratory rate improved.  Blood pressures remain labile.  Assessment & Plan:   Active Problems:   Obesity, Class III, BMI 40-49.9 (morbid obesity) (HCC)   Primary adrenocortical insufficiency (HCC)   Anemia   Chronic obstructive pulmonary disease (HCC)   Schizo affective schizophrenia (HCC)   Mass of colon   Palliative care encounter   Colon adenocarcinoma ([place])   DNR (do not resuscitate)   Chronic diastolic CHF (congestive heart failure) (HCC)   Rheumatoid arthritis (HCC)   Chronic respiratory failure with hypoxia (HCC)  Acute on chronic hypoxic respiratory failure Patient is noted to have increased work of breathing associated with an increase in oxygen demand Patient intermittently becomes significantly tachycardic and has difficulty controlling his secretions burden Nebulizers seem limited in efficacy Stat bedside chest x-ray demonstrates evidence of interstitial edema more consistent with decompensated congestive heart failure  rather than an infectious process Patient seemed to respond well to the diuresis Patient is refusing CPAP mask at night, apparently unable to tolerate ECHO: EF 60 to 65%, no obvious right ventricular dysfunction or impaired relaxation however study was limited due to body habitus  Plan: Increase Lasix to 40mg  TID Continue aggressive bronchodilator therapy Continue Foley for accurate ins and outs Daily weights Nightly CPAP, appreciate RT assistance- Patient refusing Continue empiric antibiotics for now given fever and suspicion of aspiration pneumonia however low suspicion to discontinue Continue incentive spirometry, flutter valve, aggressive pulmonary toileting    Acute on chronic anemia with heme positive stools secondary to newly diagnosed colon cancer -patient s/p 2 units of blood transfusion -Hemoglobin was 6.8 on admission.  Since that time, hemoglobins have stabilized. -He is on PPI IV twice daily -G.I. consultation with Dr. Mickie Hillier appreciated -EGD-- within normal limits -colonoscopy--An ulcerated non-obstructing large mass was found in the proximal  ascending colon. The mass was partially circumferential (involving  one-half of the lumen circumference). The mass measured one cm in  length. t -Biopsy of mass confirms invasive adenocarcinoma -patient is followed closely for his anemia at the cancer center by Dr. Rogue Bussing -Dr. Lysle Pearl from surgery consulted.  Family is unsure whether they wish to proceed with surgical resection -Palliative care consultation had been obtained, appreciate assistance -Surgery will consider outpatient Port-A-Cath placement since patient has very poor venous access.   -After discussion with oncology, staging CTs were ordered that did not show any evidence of metastatic disease. -Patient will follow-up with oncology and palliative care as an outpatient to discuss further goals of treatment   COPD on chronic home oxygen at  2 L -continue inhalers as needed - See above  Chronic diastolic congestive heart failure  EF 60% by echo in 2018.   Volume status difficult to assess given body habitus Recheck echocardiogram Lasix 40 mg IV daily Continue Foley for accurate urine output assessment   Chronic thrombocytopenia -continue to monitor  BPH on Flomax  Rheumatoid arthritis. On chronic hydroxychloroquine and chronic hydrocortisone.    At baseline patient is bed bound at baseline and is from a long term facility  DVT prophylaxis: SCDs Code Status: DNR Family Communication: none today Disposition Plan: Return to long-term care facility   Consultants:   Palliative care  Procedures:   none  Antimicrobials:  Cefepime  Flagyl   Subjective: Seen and examined Encephalopathy improving Shortness of breath improving Patient is more communicative today  Objective: Vitals:   11/13/19 2030 11/14/19 0229 11/14/19 0542 11/14/19 1209  BP: (!) 126/54  117/76 123/64  Pulse: (!) 111  (!) 52 (!) 109  Resp: (!) 21  (!) 23 20  Temp: 99.8 F (37.7 C)  99.1 F (37.3 C) 100 F (37.8 C)  TempSrc: Oral  Oral Oral  SpO2: 94% 95% 96% (!) 69%  Weight:      Height:        Intake/Output Summary (Last 24 hours) at 11/14/2019 1457 Last data filed at 11/14/2019 1405 Gross per 24 hour  Intake 471.71 ml  Output 100 ml  Net 371.71 ml   Filed Weights   10/31/19 1325 11/01/19 2336 11/04/19 1032  Weight: (!) 153.3 kg (!) 157.1 kg (!) 157.1 kg    Examination:  General exam: Alert, awake, no distress Respiratory system: Increased rhonchi and wheezing bilaterally.  Mild increase in respiratory effort. Cardiovascular system:RRR. No murmurs, rubs, gallops. Gastrointestinal system: Abdomen is nondistended, soft and nontender. No organomegaly or masses felt. Normal bowel sounds heard. Central nervous system:  No focal neurological deficits. Extremities: Changes consistent with severe rheumatoid  arthritis in hands. Skin: No rashes, lesions or ulcers Psychiatry: Confused    Data Reviewed: I have personally reviewed following labs and imaging studies  CBC: Recent Labs  Lab 11/08/19 0521 11/11/19 0031 11/12/19 0625 11/13/19 0923  WBC  --  8.9 10.8* 9.2  NEUTROABS  --   --   --  6.7  HGB 8.1* 9.0* 9.1* 8.4*  HCT 27.3* 29.2* 30.5* 28.0*  MCV  --  76.4* 79.6* 79.3*  PLT  --  301 318 [id number]   Basic Metabolic Panel: Recent Labs  Lab 11/10/19 1818 11/11/19 0031 11/12/19 0625 11/13/19 0923 11/14/19 0434  NA 135 137 134* 135 136  K 4.7 4.5 4.3 4.0 4.2  CL 100 98 95* 98 98  CO2 24 29 27 25 28   GLUCOSE 128* 105* 90 74 114*  BUN 11 14 19 17 16   CREATININE 0.54* 0.54* 0.64 0.57* 0.58*  CALCIUM 8.7* 8.6* 8.6* 8.7* 8.5*  MG  --   --   --   --  2.3   GFR: Estimated Creatinine Clearance: 148 mL/min (A) (by C-G formula based on SCr of 0.58 mg/dL (L)). Liver Function Tests: No results for input(s): AST, ALT, ALKPHOS, BILITOT, PROT, ALBUMIN in the last 168 hours. No results for input(s): LIPASE, AMYLASE in the last 168 hours. No results for input(s): AMMONIA in the last 168 hours. Coagulation Profile: No results for input(s): INR, PROTIME in the last 168 hours. Cardiac Enzymes: No results for input(s): CKTOTAL, CKMB, CKMBINDEX, TROPONINI in the last 168 hours. BNP (last 3 results) No results for input(s): PROBNP in the last 8760 hours. HbA1C: No results for input(s): HGBA1C in  the last 72 hours. CBG: No results for input(s): GLUCAP in the last 168 hours. Lipid Profile: No results for input(s): CHOL, HDL, LDLCALC, TRIG, CHOLHDL, LDLDIRECT in the last 72 hours. Thyroid Function Tests: No results for input(s): TSH, T4TOTAL, FREET4, T3FREE, THYROIDAB in the last 72 hours. Anemia Panel: No results for input(s): VITAMINB12, FOLATE, FERRITIN, TIBC, IRON, RETICCTPCT in the last 72 hours. Sepsis Labs: No results for input(s): PROCALCITON, LATICACIDVEN in the last 168  hours.  Recent Results (from the past 240 hour(s))  SARS CORONAVIRUS 2 (TAT 6-24 HRS) Nasopharyngeal Nasopharyngeal Swab     Status: None   Collection Time: 11/08/19  1:51 PM   Specimen: Nasopharyngeal Swab  Result Value Ref Range Status   SARS Coronavirus 2 NEGATIVE NEGATIVE Final    Comment: (NOTE) SARS-CoV-2 target nucleic acids are NOT DETECTED. The SARS-CoV-2 RNA is generally detectable in upper and lower respiratory specimens during the acute phase of infection. Negative results do not preclude SARS-CoV-2 infection, do not rule out co-infections with other pathogens, and should not be used as the sole basis for treatment or other patient management decisions. Negative results must be combined with clinical observations, patient history, and epidemiological information. The expected result is Negative. Fact Sheet for Patients: SugarRoll.be Fact Sheet for Healthcare Providers: https://www.woods-mathews.com/ This test is not yet approved or cleared by the [place] FDA and  has been authorized for detection and/or diagnosis of SARS-CoV-2 by FDA under an Emergency Use Authorization (EUA). This EUA will remain  in effect (meaning this test can be used) for the duration of the COVID-19 declaration under Section 56 4(b)(1) of the Act, 21 U.S.C. section 360bbb-3(b)(1), unless the authorization is terminated or revoked sooner. Performed at [facility], [place] [address], [place], [place] [address]          Radiology Studies: ECHOCARDIOGRAM COMPLETE  Result Date: 11/13/2019   ECHOCARDIOGRAM REPORT   Patient Name:   TIREK HAGEDORN Date of Exam: 11/13/2019 Medical Rec #:  [id number]   Height:       74.0 in Accession #:    [id number]  Weight:       346.4 lb Date of Birth:  [birth year]   BSA:          2.74 m Patient Age:    73 years    BP:           124/70 mmHg Patient Gender: M           HR:           101 bpm. Exam Location:  [facility]  Procedure: 2D Echo, Cardiac Doppler and Color Doppler Indications:     CHF-acute diastolic [id number]  History:         Patient has prior history of Echocardiogram examinations, most                  recent 06/26/2017. CHF, COPD; Risk Factors:Hypertension.  Sonographer:     Sherrie Sport RDCS (AE) Referring Phys:  [id number] Sidney Ace Diagnosing Phys: Ida Rogue MD  Sonographer Comments: Technically challenging study due to limited acoustic windows, no apical window and no subcostal window. IMPRESSIONS  1. Left ventricular ejection fraction, by visual estimation, is 60 to 65%. The left ventricle has normal function. There is moderately increased left ventricular hypertrophy.  2. The left ventricle has no regional wall motion abnormalities.  3. Global right ventricle has normal systolic function.The right ventricular size is normal. No increase in right ventricular wall thickness.  4. Left atrial size was mildly dilated.  5. TR signal is inadequate for assessing pulmonary artery systolic pressure. FINDINGS  Left Ventricle: Left ventricular ejection fraction, by visual estimation, is 60 to 65%. The left ventricle has normal function. The left ventricle has no regional wall motion abnormalities. There is moderately increased left ventricular hypertrophy. Normal left atrial pressure. Right Ventricle: The right ventricular size is normal. No increase in right ventricular wall thickness. Global RV systolic function is has normal systolic function. Left Atrium: Left atrial size was mildly dilated. Right Atrium: Right atrial size was normal in size Pericardium: There is no evidence of pericardial effusion. Mitral Valve: The mitral valve is normal in structure. No evidence of mitral valve regurgitation. No evidence of mitral valve stenosis by observation. Tricuspid Valve: The tricuspid valve is normal in structure. Tricuspid valve regurgitation is not demonstrated. Aortic Valve: The aortic valve was not well visualized.  Aortic valve regurgitation is not visualized. The aortic valve is structurally normal, with no evidence of sclerosis or stenosis. Pulmonic Valve: The pulmonic valve was normal in structure. Pulmonic valve regurgitation is not visualized. Pulmonic regurgitation is not visualized. Aorta: The aortic root, ascending aorta and aortic arch are all structurally normal, with no evidence of dilitation or obstruction. Venous: The inferior vena cava is normal in size with greater than 50% respiratory variability, suggesting right atrial pressure of 3 mmHg. IAS/Shunts: No atrial level shunt detected by color flow Doppler. There is no evidence of a patent foramen ovale. No ventricular septal defect is seen or detected. There is no evidence of an atrial septal defect.  LEFT VENTRICLE PLAX 2D LVIDd:         5.25 cm LVIDs:         2.98 cm LV PW:         1.33 cm LV IVS:        1.88 cm LVOT diam:     2.20 cm LV SV:         98 ml LV SV Index:   33.38 LVOT Area:     3.80 cm  LEFT ATRIUM         Index LA diam:    4.40 cm 1.60 cm/m                        PULMONIC VALVE AORTA                 PV Vmax:        0.92 m/s Ao Root diam: 3.10 cm PV Peak grad:   3.4 mmHg                       RVOT Peak grad: 4 mmHg   SHUNTS Systemic Diam: 2.20 cm  Ida Rogue MD Electronically signed by Ida Rogue MD Signature Date/Time: 11/13/2019/2:31:10 PM    Final         Scheduled Meds: . arformoterol  15 mcg Nebulization BID  . benztropine  0.5 mg Oral BID  . budesonide (PULMICORT) nebulizer solution  0.25 mg Nebulization BID  . Chlorhexidine Gluconate Cloth  6 each Topical Daily  . docusate sodium  100 mg Oral BID  . feeding supplement (ENSURE ENLIVE)  237 mL Oral TID BM  . ferrous sulfate  325 mg Oral BID WC  . fluPHENAZine  15 mg Oral QHS  . fluPHENAZine  15 mg Oral Daily  . furosemide  40 mg Intravenous TID  . guaiFENesin  600 mg Oral BID  . hydrocortisone  10 mg Oral BID  . hydroxychloroquine  200 mg Oral BID  .  ipratropium-albuterol  3 mL Nebulization Q6H  . levothyroxine  200 mcg Oral Q0600  . metoprolol tartrate  50 mg Oral BID  . metroNIDAZOLE  500 mg Oral Q8H  . multivitamin with minerals  1 tablet Oral Daily  . pantoprazole  40 mg Oral Daily  . polyethylene glycol  17 g Oral Daily  . senna  1 tablet Oral BID  . sodium chloride flush  10-40 mL Intracatheter Q12H  . tamsulosin  0.4 mg Oral Daily  . vitamin C  500 mg Oral BID   Continuous Infusions: . sodium chloride Stopped (11/14/19 0709)  . ceFEPime (MAXIPIME) IV 2 g (11/14/19 1414)     LOS: 15 days    Time spent: 35 minutes    Sidney Ace, MD [facility] Pager [phone]  If 7PM-7AM, please contact night-coverage [email or web address] Password [email or web address] 11/14/2019, 2:57 PM

## 2019-11-14 NOTE — Progress Notes (Signed)
Pt continues to refuse CPAP. CPAP remains at bedside.

## 2019-11-14 NOTE — Progress Notes (Addendum)
PT Cancellation Note  Patient Details Name: Jerry [name] MRN: [id number] DOB: [birth year]   Cancelled Treatment:    Reason Eval/Treat Not Completed: PT screened, no needs identified, will sign off(Chart reviewed. Pt seen by PT 8 weeks prior. Pt is from LTC facility, is total care at baseline, requires assist for all ADL including feeding. Pt is at his baseline level of functional independence. No PT evaluation warranted at this time. Future PT needs can be met as an outpatient.)  8:48 AM, 11/14/19 Etta Grandchild, PT, DPT Physical Therapist - [facility]  [phone] ([place])   Morrissa Shein C 11/14/2019, 8:47 AM

## 2019-11-14 NOTE — Consult Note (Signed)
PHARMACY CONSULT NOTE  Pharmacy Consult for Electrolyte Monitoring and Replacement   Recent Labs: Potassium (mmol/L)  Date Value  11/14/2019 4.2  01/05/2013 3.8   Magnesium (mg/dL)  Date Value  11/14/2019 2.3   Calcium (mg/dL)  Date Value  11/14/2019 8.5 (L)   Calcium, Total (mg/dL)  Date Value  01/05/2013 8.5   Albumin (g/dL)  Date Value  11/07/2019 2.3 (L)  01/05/2013 2.9 (L)   Phosphorus (mg/dL)  Date Value  11/07/2019 3.9   Sodium (mmol/L)  Date Value  11/14/2019 136  01/05/2013 140   Corrected Ca: 9.9 mg/dL  Assessment: 63 y.o.malewith a known history of morbid obesity, severe rheumatoid arthritis on plaquenilwith multiple contractures, CHF diastolic, [name], chronic anemia, chronic thrombocytopenia, chronic pain due to rheumatoid arthritis admitted with anemia. Furosemide 40 mg IV daily started 12/17  Goal of Therapy Given Cardiac History:  Potassium 4.0 - 5.1 mmol/L Magnesium 2.0 - 2.4 mg/dL All Other Electrolytes WNL  Plan:   Electrolytes at goal: no electrolyte replacement warranted today  Lasix dose was increased today to 40 mg IV TID: BMP in am  [name] ,PharmD Clinical Pharmacist 11/14/2019 8:32 AM

## 2019-11-14 NOTE — Progress Notes (Signed)
Notified Dr. Priscella Mann of patient's increased HR and RR, now a MEWS of 4.  Will continue to monitor.  New orders entered.

## 2019-11-14 NOTE — Progress Notes (Signed)
Patient angry would not take svn. Said not to put nothing on his face. Would not hold neb cup either. On 3 liters saturation noted in 90's hr 114 rr 26 BBS diminished with rales.

## 2019-11-15 ENCOUNTER — Inpatient Hospital Stay: Payer: Medicaid Other

## 2019-11-15 LAB — BASIC METABOLIC PANEL
Anion gap: 8 (ref 5–15)
BUN: 14 mg/dL (ref 8–23)
CO2: 32 mmol/L (ref 22–32)
Calcium: 8.3 mg/dL — ABNORMAL LOW (ref 8.9–10.3)
Chloride: 98 mmol/L (ref 98–111)
Creatinine, Ser: 0.5 mg/dL — ABNORMAL LOW (ref 0.61–1.24)
GFR calc Af Amer: >60 mL/min (ref >60)
GFR calc non Af Amer: >60 mL/min (ref >60)
Glucose, Bld: 108 mg/dL — ABNORMAL HIGH (ref 70–99)
Potassium: 3.7 mmol/L (ref 3.5–5.1)
Sodium: 138 mmol/L (ref 135–145)

## 2019-11-15 MED ORDER — POTASSIUM CHLORIDE CRYS ER 20 MEQ PO TBCR
20.0000 meq | EXTENDED_RELEASE_TABLET | Freq: Once | ORAL | Status: AC
  Administered 2019-11-15: 20 meq via ORAL
  Filled 2019-11-15: qty 1

## 2019-11-15 NOTE — Progress Notes (Signed)
Pt's last BM recorded on 11/06/19. MD made aware. New orders entered.

## 2019-11-15 NOTE — Progress Notes (Signed)
PROGRESS NOTE    Jerry [name]  [id number] DOB: February 20, 1956 DOA: 10/30/2019 PCP: [name], MD   Brief Narrative:   [name], severe rheumatoid arthritis on plaquenilwith multiple contractures, CHF diastolic, [name], chronic anemia, chronic thrombocytopenia, chronic pain due to rheumatoid arthritis comes to the emergency room from cancer center after he was found to have hemoglobin of 6.8.  12/16: Patient found to have worsening shortness of breath associated with low-grade temperature and increasing oxygen demands.  History of congestive heart failure diastolic.  Per documentation patient was previously encephalopathic so appears to be improving slowly from the standpoint.  12/17: Symptomatically improved.  Diuresed appropriately.  Respiratory rate improved.  On 2 and half to 3 L nasal cannula.  Blood pressure improved.  12/18: Diuresing well.  2 to 3 L nasal cannula.  Respiratory rate improved.  Blood pressures remain labile.  12/19: Continues to diurese well however remains on 3 L nasal cannula.  Respiratory rate labile.  Seems unable to clear secretions.  Blood pressure is also labile.  Thought process more convoluted this morning.  Assessment & Plan:   Active Problems:   Obesity, Class III, BMI 40-49.9 (morbid obesity) (HCC)   Primary adrenocortical insufficiency (HCC)   Anemia   Chronic obstructive pulmonary disease (HCC)   Schizo affective schizophrenia (HCC)   Mass of colon   Palliative care encounter   Colon adenocarcinoma ([place])   DNR (do not resuscitate)   Chronic diastolic CHF (congestive heart failure) (HCC)   Rheumatoid arthritis (HCC)   Chronic respiratory failure with hypoxia (HCC)  Acute on chronic hypoxic respiratory failure Patient is noted to have increased work of breathing associated with an increase in oxygen demand Patient intermittently becomes significantly tachycardic and  has difficulty controlling his secretions burden Nebulizers seem limited in efficacy Stat bedside chest x-ray demonstrates evidence of interstitial edema more consistent with decompensated congestive heart failure rather than an infectious process Patient seemed to respond well to the diuresis Patient is refusing CPAP mask at night, apparently unable to tolerate ECHO: EF 60 to 65%, no obvious right ventricular dysfunction or impaired relaxation however study was limited due to body habitus  Plan: Continue Lasix 40 3 times daily As needed Lasix 40 mg IV for respiratory distress Continue aggressive bronchodilator therapy Continue Foley for accurate ins and outs Daily weights Nightly CPAP, appreciate RT assistance- Patient refusing.  Spoke with patient on importance of this.  He states he will attempt. Stop antibiotics, no indication of infectious origin Continue incentive spirometry, flutter valve, aggressive pulmonary toileting    Acute on chronic anemia with heme positive stools secondary to newly diagnosed colon cancer -patient s/p 2 units of blood transfusion -Hemoglobin was 6.8 on admission.  Since that time, hemoglobins have stabilized. -He is on PPI IV twice daily -G.I. consultation with Dr. Mickie Hillier appreciated -EGD-- within normal limits -colonoscopy--An ulcerated non-obstructing large mass was found in the proximal  ascending colon. The mass was partially circumferential (involving  one-half of the lumen circumference). The mass measured one cm in  length. t -Biopsy of mass confirms invasive adenocarcinoma -patient is followed closely for his anemia at the cancer center by Dr. Rogue Bussing -Dr. Lysle Pearl from surgery consulted.  Family is unsure whether they wish to proceed with surgical resection -Palliative care consultation had been obtained, appreciate assistance -Surgery will consider outpatient Port-A-Cath placement since patient has very poor venous  access.   -After discussion with oncology, staging CTs were  ordered that did not show any evidence of metastatic disease. -Patient will follow-up with oncology and palliative care as an outpatient to discuss further goals of treatment   COPD on chronic home oxygen at 2 L -continue inhalers as needed - See above  Chronic diastolic congestive heart failure  EF 60% by echo in 2018.   Volume status difficult to assess given body habitus Recheck echocardiogram Lasix 40 mg IV daily Continue Foley for accurate urine output assessment   Chronic thrombocytopenia -continue to monitor  BPH on Flomax  Rheumatoid arthritis. On chronic hydroxychloroquine and chronic hydrocortisone.    At baseline patient is bed bound at baseline and is from a long term facility  DVT prophylaxis: SCDs Code Status: DNR Family Communication: none today Disposition Plan: Return to long-term care facility   Consultants:   Palliative care  Procedures:   none  Antimicrobials:  Cefepime  Flagyl   Subjective: Seen and examined Encephalopathy improving Shortness of breath improving Patient is more communicative today  Objective: Vitals:   11/15/19 0843 11/15/19 0947 11/15/19 1222 11/15/19 1222  BP:  110/67  109/74  Pulse:  (!) 102 100 100  Resp:  20  (!) 22  Temp:   99.2 F (37.3 C)   TempSrc:   Axillary   SpO2: 98%  96% 98%  Weight:      Height:        Intake/Output Summary (Last 24 hours) at 11/15/2019 1441 Last data filed at 11/15/2019 1200 Gross per 24 hour  Intake 743.6 ml  Output 3600 ml  Net -2856.4 ml   Filed Weights   10/31/19 1325 11/01/19 2336 11/04/19 1032  Weight: (!) 153.3 kg (!) 157.1 kg (!) 157.1 kg    Examination:  General exam: Alert, awake, no distress Respiratory system: Increased rhonchi and wheezing bilaterally.  Mild increase in respiratory effort. Cardiovascular system:RRR. No murmurs, rubs, gallops. Gastrointestinal system: Abdomen is  nondistended, soft and nontender. No organomegaly or masses felt. Normal bowel sounds heard. Central nervous system:  No focal neurological deficits. Extremities: Changes consistent with severe rheumatoid arthritis in hands. Skin: No rashes, lesions or ulcers Psychiatry: Confused    Data Reviewed: I have personally reviewed following labs and imaging studies  CBC: Recent Labs  Lab 11/11/19 0031 11/12/19 0625 11/13/19 0923  WBC 8.9 10.8* 9.2  NEUTROABS  --   --  6.7  HGB 9.0* 9.1* 8.4*  HCT 29.2* 30.5* 28.0*  MCV 76.4* 79.6* 79.3*  PLT 301 318 [id number]   Basic Metabolic Panel: Recent Labs  Lab 11/11/19 0031 11/12/19 0625 11/13/19 0923 11/14/19 0434 11/15/19 0459  NA 137 134* 135 136 138  K 4.5 4.3 4.0 4.2 3.7  CL 98 95* 98 98 98  CO2 29 27 25 28  32  GLUCOSE 105* 90 74 114* 108*  BUN 14 19 17 16 14   CREATININE 0.54* 0.64 0.57* 0.58* 0.50*  CALCIUM 8.6* 8.6* 8.7* 8.5* 8.3*  MG  --   --   --  2.3  --    GFR: Estimated Creatinine Clearance: 148 mL/min (A) (by C-G formula based on SCr of 0.5 mg/dL (L)). Liver Function Tests: No results for input(s): AST, ALT, ALKPHOS, BILITOT, PROT, ALBUMIN in the last 168 hours. No results for input(s): LIPASE, AMYLASE in the last 168 hours. No results for input(s): AMMONIA in the last 168 hours. Coagulation Profile: No results for input(s): INR, PROTIME in the last 168 hours. Cardiac Enzymes: No results for input(s): CKTOTAL, CKMB, CKMBINDEX, TROPONINI in the  last 168 hours. BNP (last 3 results) No results for input(s): PROBNP in the last 8760 hours. HbA1C: No results for input(s): HGBA1C in the last 72 hours. CBG: No results for input(s): GLUCAP in the last 168 hours. Lipid Profile: No results for input(s): CHOL, HDL, LDLCALC, TRIG, CHOLHDL, LDLDIRECT in the last 72 hours. Thyroid Function Tests: No results for input(s): TSH, T4TOTAL, FREET4, T3FREE, THYROIDAB in the last 72 hours. Anemia Panel: No results for input(s):  VITAMINB12, FOLATE, FERRITIN, TIBC, IRON, RETICCTPCT in the last 72 hours. Sepsis Labs: No results for input(s): PROCALCITON, LATICACIDVEN in the last 168 hours.  Recent Results (from the past 240 hour(s))  SARS CORONAVIRUS 2 (TAT 6-24 HRS) Nasopharyngeal Nasopharyngeal Swab     Status: None   Collection Time: 11/08/19  1:51 PM   Specimen: Nasopharyngeal Swab  Result Value Ref Range Status   SARS Coronavirus 2 NEGATIVE NEGATIVE Final    Comment: (NOTE) SARS-CoV-2 target nucleic acids are NOT DETECTED. The SARS-CoV-2 RNA is generally detectable in upper and lower respiratory specimens during the acute phase of infection. Negative results do not preclude SARS-CoV-2 infection, do not rule out co-infections with other pathogens, and should not be used as the sole basis for treatment or other patient management decisions. Negative results must be combined with clinical observations, patient history, and epidemiological information. The expected result is Negative. Fact Sheet for Patients: SugarRoll.be Fact Sheet for Healthcare Providers: https://www.woods-mathews.com/ This test is not yet approved or cleared by the [place] FDA and  has been authorized for detection and/or diagnosis of SARS-CoV-2 by FDA under an Emergency Use Authorization (EUA). This EUA will remain  in effect (meaning this test can be used) for the duration of the COVID-19 declaration under Section 56 4(b)(1) of the Act, 21 U.S.C. section 360bbb-3(b)(1), unless the authorization is terminated or revoked sooner. Performed at [facility], [place] [address], [place], [place] [address]          Radiology Studies: No results found.      Scheduled Meds: . arformoterol  15 mcg Nebulization BID  . benztropine  0.5 mg Oral BID  . budesonide (PULMICORT) nebulizer solution  0.25 mg Nebulization BID  . Chlorhexidine Gluconate Cloth  6 each Topical Daily  . docusate  sodium  100 mg Oral BID  . feeding supplement (ENSURE ENLIVE)  237 mL Oral TID BM  . ferrous sulfate  325 mg Oral BID WC  . fluPHENAZine  15 mg Oral QHS  . fluPHENAZine  15 mg Oral Daily  . furosemide  40 mg Intravenous TID  . guaiFENesin  600 mg Oral BID  . hydrocortisone  10 mg Oral BID  . hydroxychloroquine  200 mg Oral BID  . ipratropium-albuterol  3 mL Nebulization Q6H  . levothyroxine  200 mcg Oral Q0600  . metoprolol tartrate  50 mg Oral BID  . metroNIDAZOLE  500 mg Oral Q8H  . multivitamin with minerals  1 tablet Oral Daily  . pantoprazole  40 mg Oral Daily  . polyethylene glycol  17 g Oral Daily  . senna  1 tablet Oral BID  . sodium chloride flush  10-40 mL Intracatheter Q12H  . tamsulosin  0.4 mg Oral Daily  . vitamin C  500 mg Oral BID   Continuous Infusions: . sodium chloride 10 mL/hr at 11/15/19 1423  . ceFEPime (MAXIPIME) IV 2 g (11/15/19 1424)     LOS: 16 days    Time spent: 35 minutes    Maryiah Olvey  Sloan Leiter, MD [facility] Pager [phone]  If 7PM-7AM, please contact night-coverage [email or web address] Password [email or web address] 11/15/2019, 2:41 PM

## 2019-11-15 NOTE — Consult Note (Signed)
PHARMACY CONSULT NOTE  Pharmacy Consult for Electrolyte Monitoring and Replacement   Recent Labs: Potassium (mmol/L)  Date Value  11/15/2019 3.7  01/05/2013 3.8   Magnesium (mg/dL)  Date Value  11/14/2019 2.3   Calcium (mg/dL)  Date Value  11/15/2019 8.3 (L)   Calcium, Total (mg/dL)  Date Value  01/05/2013 8.5   Albumin (g/dL)  Date Value  11/07/2019 2.3 (L)  01/05/2013 2.9 (L)   Phosphorus (mg/dL)  Date Value  11/07/2019 3.9   Sodium (mmol/L)  Date Value  11/15/2019 138  01/05/2013 140   Corrected Ca: 9.9 mg/dL  Assessment: 63 y.o.malewith a known history of morbid obesity, severe rheumatoid arthritis on plaquenilwith multiple contractures, CHF diastolic, [name], chronic anemia, chronic thrombocytopenia, chronic pain due to rheumatoid arthritis admitted with anemia. Furosemide 40 mg IV daily started 12/17  Goal of Therapy Given Cardiac History:  Potassium 4.0 - 5.1 mmol/L Magnesium 2.0 - 2.4 mg/dL All Other Electrolytes WNL  Plan:   KCL 2meq PO once  Lasix dose was increased today to 40 mg IV TID.   BMP in am  Olivia Canter, [facility] Clinical Pharmacist 11/15/2019 10:02 AM

## 2019-11-16 LAB — POTASSIUM: Potassium: 3.5 mmol/L (ref 3.5–5.1)

## 2019-11-16 LAB — MAGNESIUM: Magnesium: 2.2 mg/dL (ref 1.7–2.4)

## 2019-11-16 MED ORDER — POTASSIUM CHLORIDE CRYS ER 20 MEQ PO TBCR
40.0000 meq | EXTENDED_RELEASE_TABLET | Freq: Once | ORAL | Status: AC
  Administered 2019-11-16: 40 meq via ORAL
  Filled 2019-11-16: qty 2

## 2019-11-16 MED ORDER — BISACODYL 10 MG RE SUPP
10.0000 mg | Freq: Once | RECTAL | Status: AC
  Administered 2019-11-16: 12:00:00 10 mg via RECTAL
  Filled 2019-11-16: qty 1

## 2019-11-16 NOTE — Progress Notes (Signed)
PROGRESS NOTE    [name]  [id number] DOB: August 06, 1956 DOA: 10/30/2019 PCP: Jerry Pitch, MD   Brief Narrative:   [name] a41 y.o.malewith a known history of morbid obesity, severe rheumatoid arthritis on plaquenilwith multiple contractures, CHF diastolic, [name], chronic anemia, chronic thrombocytopenia, chronic pain due to rheumatoid arthritis comes to the emergency room from cancer center after he was found to have hemoglobin of 6.8.  12/16: Patient found to have worsening shortness of breath associated with low-grade temperature and increasing oxygen demands.  History of congestive heart failure diastolic.  Per documentation patient was previously encephalopathic so appears to be improving slowly from the standpoint.  12/17: Symptomatically improved.  Diuresed appropriately.  Respiratory rate improved.  On 2 and half to 3 L nasal cannula.  Blood pressure improved.  12/18: Diuresing well.  2 to 3 L nasal cannula.  Respiratory rate improved.  Blood pressures remain labile.  12/19: Continues to diurese well however remains on 3 L nasal cannula.  Respiratory rate labile.  Seems unable to clear secretions.  Blood pressure is also labile.  Thought process more convoluted this morning.  12/20: Continue to diurese.  Respiratory status is showing some signs of improvement.  Blood pressure remains labile.  Assessment & Plan:   Active Problems:   Obesity, Class III, BMI 40-49.9 (morbid obesity) (HCC)   Primary adrenocortical insufficiency (HCC)   Anemia   Chronic obstructive pulmonary disease (HCC)   Schizo affective schizophrenia (HCC)   Mass of colon   Palliative care encounter   Colon adenocarcinoma ([place])   DNR (do not resuscitate)   Chronic diastolic CHF (congestive heart failure) (HCC)   Rheumatoid arthritis (HCC)   Chronic respiratory failure with hypoxia (HCC)  Acute on chronic hypoxic respiratory failure Patient is noted to have increased work  of breathing associated with an increase in oxygen demand Patient intermittently becomes significantly tachycardic and has difficulty controlling his secretions burden Nebulizers seem limited in efficacy Stat bedside chest x-ray demonstrates evidence of interstitial edema more consistent with decompensated congestive heart failure rather than an infectious process Patient seemed to respond well to the diuresis Patient is refusing CPAP mask at night, apparently unable to tolerate ECHO: EF 60 to 65%, no obvious right ventricular dysfunction or impaired relaxation however study was limited due to body habitus  Plan: Continue Lasix 40 3 times daily As needed Lasix 40 mg IV for respiratory distress Continue aggressive bronchodilator therapy Continue Foley for accurate ins and outs Daily weights Nightly CPAP, appreciate RT assistance- Patient refusing.  Spoke with patient on importance of this.  He states he will attempt. No abx at this time Continue incentive spirometry, flutter valve, aggressive pulmonary toileting    Acute on chronic anemia with heme positive stools secondary to newly diagnosed colon cancer -patient s/p 2 units of blood transfusion -Hemoglobin was 6.8 on admission.  Since that time, hemoglobins have stabilized. -He is on PPI IV twice daily -G.I. consultation with Dr. Mickie [name] appreciated -EGD-- within normal limits -colonoscopy--An ulcerated non-obstructing large mass was found in the proximal  ascending colon. The mass was partially circumferential (involving  one-half of the lumen circumference). The mass measured one cm in  length. t -Biopsy of mass confirms invasive adenocarcinoma -patient is followed closely for his anemia at the cancer center by Dr. Rogue [name] -Dr. Lysle [name] from surgery consulted.  Family is unsure whether they wish to proceed with surgical resection -Palliative care consultation had been obtained, appreciate  assistance -Surgery will consider outpatient Port-A-Cath  placement since patient has very poor venous access.   -After discussion with oncology, staging CTs were ordered that did not show any evidence of metastatic disease. -Patient will follow-up with oncology and palliative care as an outpatient to discuss further goals of treatment   COPD on chronic home oxygen at 2 L -continue inhalers as needed - See above  Chronic diastolic congestive heart failure  EF 60% by echo in 2018.   Volume status difficult to assess given body habitus Recheck echocardiogram Lasix 40 mg IV daily Continue Foley for accurate urine output assessment   Chronic thrombocytopenia -continue to monitor  BPH on Flomax  Rheumatoid arthritis. On chronic hydroxychloroquine and chronic hydrocortisone.    At baseline patient is bed bound at baseline and is from a long term facility  DVT prophylaxis: SCDs Code Status: DNR Family Communication: none today Disposition Plan: Return to long-term care facility   Consultants:   Palliative care  Procedures:   none  Antimicrobials:  Cefepime  Flagyl   Subjective: Seen and examined Encephalopathy improving Shortness of breath improving Patient is more communicative today  Objective: Vitals:   11/16/19 0138 11/16/19 0618 11/16/19 1208 11/16/19 1217  BP:  (!) 137/93 123/87   Pulse:  93 (!) 107 94  Resp:  (!) 24    Temp:  98.3 F (36.8 C) 99.2 F (37.3 C)   TempSrc:  Oral Oral   SpO2: 97% 99% 95%   Weight:      Height:        Intake/Output Summary (Last 24 hours) at 11/16/2019 1343 Last data filed at 11/16/2019 [id number] Gross per 24 hour  Intake --  Output 1775 ml  Net -1775 ml   Filed Weights   10/31/19 1325 11/01/19 2336 11/04/19 1032  Weight: (!) 153.3 kg (!) 157.1 kg (!) 157.1 kg    Examination:  General exam: Alert, awake, no distress Respiratory system: Increased rhonchi and wheezing bilaterally.  Mild increase in  respiratory effort. Cardiovascular system:RRR. No murmurs, rubs, gallops. Gastrointestinal system: Abdomen is nondistended, soft and nontender. No organomegaly or masses felt. Normal bowel sounds heard. Central nervous system:  No focal neurological deficits. Extremities: Changes consistent with severe rheumatoid arthritis in hands. Skin: No rashes, lesions or ulcers Psychiatry: Confused    Data Reviewed: I have personally reviewed following labs and imaging studies  CBC: Recent Labs  Lab 11/11/19 0031 11/12/19 0625 11/13/19 0923  WBC 8.9 10.8* 9.2  NEUTROABS  --   --  6.7  HGB 9.0* 9.1* 8.4*  HCT 29.2* 30.5* 28.0*  MCV 76.4* 79.6* 79.3*  PLT 301 318 [id number]   Basic Metabolic Panel: Recent Labs  Lab 11/11/19 0031 11/12/19 0625 11/13/19 0923 11/14/19 0434 11/15/19 0459 11/16/19 0631  NA 137 134* 135 136 138  --   K 4.5 4.3 4.0 4.2 3.7 3.5  CL 98 95* 98 98 98  --   CO2 29 27 25 28  32  --   GLUCOSE 105* 90 74 114* 108*  --   BUN 14 19 17 16 14   --   CREATININE 0.54* 0.64 0.57* 0.58* 0.50*  --   CALCIUM 8.6* 8.6* 8.7* 8.5* 8.3*  --   MG  --   --   --  2.3  --  2.2   GFR: Estimated Creatinine Clearance: 148 mL/min (A) (by C-G formula based on SCr of 0.5 mg/dL (L)). Liver Function Tests: No results for input(s): AST, ALT, ALKPHOS, BILITOT, PROT, ALBUMIN in the last 168 hours. No  results for input(s): LIPASE, AMYLASE in the last 168 hours. No results for input(s): AMMONIA in the last 168 hours. Coagulation Profile: No results for input(s): INR, PROTIME in the last 168 hours. Cardiac Enzymes: No results for input(s): CKTOTAL, CKMB, CKMBINDEX, TROPONINI in the last 168 hours. BNP (last 3 results) No results for input(s): PROBNP in the last 8760 hours. HbA1C: No results for input(s): HGBA1C in the last 72 hours. CBG: No results for input(s): GLUCAP in the last 168 hours. Lipid Profile: No results for input(s): CHOL, HDL, LDLCALC, TRIG, CHOLHDL, LDLDIRECT in the last 72  hours. Thyroid Function Tests: No results for input(s): TSH, T4TOTAL, FREET4, T3FREE, THYROIDAB in the last 72 hours. Anemia Panel: No results for input(s): VITAMINB12, FOLATE, FERRITIN, TIBC, IRON, RETICCTPCT in the last 72 hours. Sepsis Labs: No results for input(s): PROCALCITON, LATICACIDVEN in the last 168 hours.  Recent Results (from the past 240 hour(s))  SARS CORONAVIRUS 2 (TAT 6-24 HRS) Nasopharyngeal Nasopharyngeal Swab     Status: None   Collection Time: 11/08/19  1:51 PM   Specimen: Nasopharyngeal Swab  Result Value Ref Range Status   SARS Coronavirus 2 NEGATIVE NEGATIVE Final    Comment: (NOTE) SARS-CoV-2 target nucleic acids are NOT DETECTED. The SARS-CoV-2 RNA is generally detectable in upper and lower respiratory specimens during the acute phase of infection. Negative results do not preclude SARS-CoV-2 infection, do not rule out co-infections with other pathogens, and should not be used as the sole basis for treatment or other patient management decisions. Negative results must be combined with clinical observations, patient history, and epidemiological information. The expected result is Negative. Fact Sheet for Patients: SugarRoll.be Fact Sheet for Healthcare Providers: https://www.woods-mathews.com/ This test is not yet approved or cleared by the [place] FDA and  has been authorized for detection and/or diagnosis of SARS-CoV-2 by FDA under an Emergency Use Authorization (EUA). This EUA will remain  in effect (meaning this test can be used) for the duration of the COVID-19 declaration under Section 56 4(b)(1) of the Act, 21 U.S.C. section 360bbb-3(b)(1), unless the authorization is terminated or revoked sooner. Performed at [facility], [place] [address], [place], [place] [address]          Radiology Studies: DG Abd 1 View  Result Date: 11/15/2019 CLINICAL DATA:  Constipation.  Anemia. EXAM: ABDOMEN - 1  VIEW COMPARISON:  None FINDINGS: Moderate fecal loading in the colon. Air-filled prominent loops of large and small bowel. IMPRESSION: Probable mild colonic ileus.  Moderate fecal loading in the colon. Electronically Signed   By: Dorise Bullion III M.D   On: 11/15/2019 17:37        Scheduled Meds: . arformoterol  15 mcg Nebulization BID  . benztropine  0.5 mg Oral BID  . budesonide (PULMICORT) nebulizer solution  0.25 mg Nebulization BID  . Chlorhexidine Gluconate Cloth  6 each Topical Daily  . docusate sodium  100 mg Oral BID  . feeding supplement (ENSURE ENLIVE)  237 mL Oral TID BM  . ferrous sulfate  325 mg Oral BID WC  . fluPHENAZine  15 mg Oral QHS  . fluPHENAZine  15 mg Oral Daily  . furosemide  40 mg Intravenous TID  . guaiFENesin  600 mg Oral BID  . hydrocortisone  10 mg Oral BID  . hydroxychloroquine  200 mg Oral BID  . ipratropium-albuterol  3 mL Nebulization Q6H  . levothyroxine  200 mcg Oral Q0600  . metoprolol tartrate  50 mg Oral BID  .  multivitamin with minerals  1 tablet Oral Daily  . pantoprazole  40 mg Oral Daily  . polyethylene glycol  17 g Oral Daily  . senna  1 tablet Oral BID  . sodium chloride flush  10-40 mL Intracatheter Q12H  . tamsulosin  0.4 mg Oral Daily  . vitamin C  500 mg Oral BID   Continuous Infusions: . sodium chloride 10 mL/hr at 11/15/19 1423     LOS: 17 days    Time spent: 35 minutes    Sidney Ace, MD [facility] Pager [phone]  If 7PM-7AM, please contact night-coverage [email or web address] Password [email or web address] 11/16/2019, 1:43 PM

## 2019-11-16 NOTE — Consult Note (Signed)
PHARMACY CONSULT NOTE  Pharmacy Consult for Electrolyte Monitoring and Replacement   Recent Labs: Potassium (mmol/L)  Date Value  11/16/2019 3.5  01/05/2013 3.8   Magnesium (mg/dL)  Date Value  11/16/2019 2.2   Calcium (mg/dL)  Date Value  11/15/2019 8.3 (L)   Calcium, Total (mg/dL)  Date Value  01/05/2013 8.5   Albumin (g/dL)  Date Value  11/07/2019 2.3 (L)  01/05/2013 2.9 (L)   Phosphorus (mg/dL)  Date Value  11/07/2019 3.9   Sodium (mmol/L)  Date Value  11/15/2019 138  01/05/2013 140   Corrected Ca: 9.9 mg/dL  Assessment: 63 y.o.malewith a known history of morbid obesity, severe rheumatoid arthritis on plaquenilwith multiple contractures, CHF diastolic, [name], chronic anemia, chronic thrombocytopenia, chronic pain due to rheumatoid arthritis admitted with anemia. Furosemide 40 mg IV TID started 12/18.  Goal of Therapy Given Cardiac History:  Potassium 4.0 - 5.1 mmol/L Magnesium 2.0 - 2.4 mg/dL All Other Electrolytes WNL  Plan:   KCL 80meq PO once  BMP in am  Olivia Canter, [place] Pharmacist 11/16/2019 8:33 AM

## 2019-11-17 LAB — BASIC METABOLIC PANEL
Anion gap: 13 (ref 5–15)
BUN: 12 mg/dL (ref 8–23)
CO2: 27 mmol/L (ref 22–32)
Calcium: 8.4 mg/dL — ABNORMAL LOW (ref 8.9–10.3)
Chloride: 100 mmol/L (ref 98–111)
Creatinine, Ser: 0.45 mg/dL — ABNORMAL LOW (ref 0.61–1.24)
GFR calc Af Amer: >60 mL/min (ref >60)
GFR calc non Af Amer: >60 mL/min (ref >60)
Glucose, Bld: 107 mg/dL — ABNORMAL HIGH (ref 70–99)
Potassium: 3.6 mmol/L (ref 3.5–5.1)
Sodium: 140 mmol/L (ref 135–145)

## 2019-11-17 LAB — MAGNESIUM: Magnesium: 2.3 mg/dL (ref 1.7–2.4)

## 2019-11-17 MED ORDER — POTASSIUM CHLORIDE CRYS ER 20 MEQ PO TBCR
40.0000 meq | EXTENDED_RELEASE_TABLET | Freq: Three times a day (TID) | ORAL | Status: DC
  Administered 2019-11-17: 40 meq via ORAL
  Filled 2019-11-17 (×2): qty 2

## 2019-11-17 NOTE — Progress Notes (Signed)
PROGRESS NOTE    Jerry [name]  [id number] DOB: 06/06/1956 DOA: 10/30/2019 PCP: [name], MD   Brief Narrative:   [name] a52 y.o.malewith a known history of morbid obesity, severe rheumatoid arthritis on plaquenilwith multiple contractures, CHF diastolic, [name], chronic anemia, chronic thrombocytopenia, chronic pain due to rheumatoid arthritis comes to the emergency room from cancer center after he was found to have hemoglobin of 6.8.  12/16: Patient found to have worsening shortness of breath associated with low-grade temperature and increasing oxygen demands.  History of congestive heart failure diastolic.  Per documentation patient was previously encephalopathic so appears to be improving slowly from the standpoint.  12/17: Symptomatically improved.  Diuresed appropriately.  Respiratory rate improved.  On 2 and half to 3 L nasal cannula.  Blood pressure improved.  12/18: Diuresing well.  2 to 3 L nasal cannula.  Respiratory rate improved.  Blood pressures remain labile.  12/19: Continues to diurese well however remains on 3 L nasal cannula.  Respiratory rate labile.  Seems unable to clear secretions.  Blood pressure is also labile.  Thought process more convoluted this morning.  12/20: Continue to diurese.  Respiratory status is showing some signs of improvement.  Blood pressure remains labile.  12/21: Respiratory status appears improved.  Thought process more clear.  BP remains labile.  Assessment & Plan:   Active Problems:   Obesity, Class III, BMI 40-49.9 (morbid obesity) (HCC)   Primary adrenocortical insufficiency (HCC)   Anemia   Chronic obstructive pulmonary disease (HCC)   Schizo affective schizophrenia (HCC)   Mass of colon   Palliative care encounter   Colon adenocarcinoma ([place])   DNR (do not resuscitate)   Chronic diastolic CHF (congestive heart failure) (HCC)   Rheumatoid arthritis (HCC)   Chronic respiratory failure with hypoxia  (HCC)  Acute on chronic hypoxic respiratory failure Patient is noted to have increased work of breathing associated with an increase in oxygen demand Patient intermittently becomes significantly tachycardic and has difficulty controlling his secretions burden Nebulizers seem limited in efficacy Stat bedside chest x-ray demonstrates evidence of interstitial edema more consistent with decompensated congestive heart failure rather than an infectious process Patient seemed to respond well to the diuresis Patient is refusing CPAP mask at night, apparently unable to tolerate ECHO: EF 60 to 65%, no obvious right ventricular dysfunction or impaired relaxation however study was limited due to body habitus  Plan: Continue Lasix 40 3 times daily As needed Lasix 40 mg IV for respiratory distress Continue aggressive bronchodilator therapy Continue Foley for accurate ins and outs Daily weights Nightly CPAP, appreciate RT assistance- Patient refusing.  Spoke with patient on importance of this.  He states he will attempt. No abx at this time Continue incentive spirometry, flutter valve, aggressive pulmonary toileting If Patient's respiratory status stays stable or continues to improve we will plan to start discharge planning on 11/18/2019.  Patient is a long-term care resident no return there.  DC foley prior to patient leaving hospital.    Acute on chronic anemia with heme positive stools secondary to newly diagnosed colon cancer -patient s/p 2 units of blood transfusion -Hemoglobin was 6.8 on admission.  Since that time, hemoglobins have stabilized. -He is on PPI IV twice daily -G.I. consultation with Dr. Mickie Hillier appreciated -EGD-- within normal limits -colonoscopy--An ulcerated non-obstructing large mass was found in the proximal  ascending colon. The mass was partially circumferential (involving  one-half of the lumen circumference). The mass measured one cm in  length.  t -Biopsy of mass confirms invasive adenocarcinoma -patient is followed closely for his anemia at the cancer center by Dr. Rogue Bussing -Dr. Lysle Pearl from surgery consulted.  Family is unsure whether they wish to proceed with surgical resection -Palliative care consultation had been obtained, appreciate assistance -Surgery will consider outpatient Port-A-Cath placement since patient has very poor venous access.   -After discussion with oncology, staging CTs were ordered that did not show any evidence of metastatic disease. -Patient will follow-up with oncology and palliative care as an outpatient to discuss further goals of treatment   COPD on chronic home oxygen at 2 L -continue inhalers as needed - See above  Chronic diastolic congestive heart failure  EF 60% by echo in 2018.   Volume status difficult to assess given body habitus Recheck echocardiogram Lasix 40 mg IV daily Continue Foley for accurate urine output assessment   Chronic thrombocytopenia -continue to monitor  BPH on Flomax  Rheumatoid arthritis. On chronic hydroxychloroquine and chronic hydrocortisone.    At baseline patient is bed bound at baseline and is from a long term facility  DVT prophylaxis: SCDs Code Status: DNR Family Communication: none today Disposition Plan: Return to long-term care facility   Consultants:   Palliative care  Procedures:   none  Antimicrobials:  Cefepime  Flagyl   Subjective: Seen and examined Encephalopathy improving Shortness of breath improving Patient is more communicative today  Objective: Vitals:   11/17/19 0401 11/17/19 0631 11/17/19 0811 11/17/19 1224  BP:  128/89  136/90  Pulse:  100  100  Resp:  (!) 24  20  Temp:  99.4 F (37.4 C)  98.7 F (37.1 C)  TempSrc:  Oral  Oral  SpO2: 99% 100% 96% 100%  Weight:      Height:        Intake/Output Summary (Last 24 hours) at 11/17/2019 1344 Last data filed at 11/17/2019 0900 Gross per 24 hour    Intake 120 ml  Output 950 ml  Net -830 ml   Filed Weights   10/31/19 1325 11/01/19 2336 11/04/19 1032  Weight: (!) 153.3 kg (!) 157.1 kg (!) 157.1 kg    Examination:  General exam: Alert, awake, no distress Respiratory system: Increased rhonchi and wheezing bilaterally.  Mild increase in respiratory effort. Cardiovascular system:RRR. No murmurs, rubs, gallops. Gastrointestinal system: Abdomen is nondistended, soft and nontender. No organomegaly or masses felt. Normal bowel sounds heard. Central nervous system:  No focal neurological deficits. Extremities: Changes consistent with severe rheumatoid arthritis in hands. Skin: No rashes, lesions or ulcers Psychiatry: Confused    Data Reviewed: I have personally reviewed following labs and imaging studies  CBC: Recent Labs  Lab 11/11/19 0031 11/12/19 0625 11/13/19 0923  WBC 8.9 10.8* 9.2  NEUTROABS  --   --  6.7  HGB 9.0* 9.1* 8.4*  HCT 29.2* 30.5* 28.0*  MCV 76.4* 79.6* 79.3*  PLT 301 318 [id number]   Basic Metabolic Panel: Recent Labs  Lab 11/12/19 0625 11/13/19 0923 11/14/19 0434 11/15/19 0459 11/16/19 0631 11/17/19 0530  NA 134* 135 136 138  --  140  K 4.3 4.0 4.2 3.7 3.5 3.6  CL 95* 98 98 98  --  100  CO2 27 25 28  32  --  27  GLUCOSE 90 74 114* 108*  --  107*  BUN 19 17 16 14   --  12  CREATININE 0.64 0.57* 0.58* 0.50*  --  0.45*  CALCIUM 8.6* 8.7* 8.5* 8.3*  --  8.4*  MG  --   --  2.3  --  2.2 2.3   GFR: Estimated Creatinine Clearance: 148 mL/min (A) (by C-G formula based on SCr of 0.45 mg/dL (L)). Liver Function Tests: No results for input(s): AST, ALT, ALKPHOS, BILITOT, PROT, ALBUMIN in the last 168 hours. No results for input(s): LIPASE, AMYLASE in the last 168 hours. No results for input(s): AMMONIA in the last 168 hours. Coagulation Profile: No results for input(s): INR, PROTIME in the last 168 hours. Cardiac Enzymes: No results for input(s): CKTOTAL, CKMB, CKMBINDEX, TROPONINI in the last 168  hours. BNP (last 3 results) No results for input(s): PROBNP in the last 8760 hours. HbA1C: No results for input(s): HGBA1C in the last 72 hours. CBG: No results for input(s): GLUCAP in the last 168 hours. Lipid Profile: No results for input(s): CHOL, HDL, LDLCALC, TRIG, CHOLHDL, LDLDIRECT in the last 72 hours. Thyroid Function Tests: No results for input(s): TSH, T4TOTAL, FREET4, T3FREE, THYROIDAB in the last 72 hours. Anemia Panel: No results for input(s): VITAMINB12, FOLATE, FERRITIN, TIBC, IRON, RETICCTPCT in the last 72 hours. Sepsis Labs: No results for input(s): PROCALCITON, LATICACIDVEN in the last 168 hours.  Recent Results (from the past 240 hour(s))  SARS CORONAVIRUS 2 (TAT 6-24 HRS) Nasopharyngeal Nasopharyngeal Swab     Status: None   Collection Time: 11/08/19  1:51 PM   Specimen: Nasopharyngeal Swab  Result Value Ref Range Status   SARS Coronavirus 2 NEGATIVE NEGATIVE Final    Comment: (NOTE) SARS-CoV-2 target nucleic acids are NOT DETECTED. The SARS-CoV-2 RNA is generally detectable in upper and lower respiratory specimens during the acute phase of infection. Negative results do not preclude SARS-CoV-2 infection, do not rule out co-infections with other pathogens, and should not be used as the sole basis for treatment or other patient management decisions. Negative results must be combined with clinical observations, patient history, and epidemiological information. The expected result is Negative. Fact Sheet for Patients: SugarRoll.be Fact Sheet for Healthcare Providers: https://www.woods-mathews.com/ This test is not yet approved or cleared by the [place] FDA and  has been authorized for detection and/or diagnosis of SARS-CoV-2 by FDA under an Emergency Use Authorization (EUA). This EUA will remain  in effect (meaning this test can be used) for the duration of the COVID-19 declaration under Section 56 4(b)(1) of the  Act, 21 U.S.C. section 360bbb-3(b)(1), unless the authorization is terminated or revoked sooner. Performed at [facility], [place] [address] [place], [place] [address]          Radiology Studies: DG Abd 1 View  Result Date: 11/15/2019 CLINICAL DATA:  Constipation.  Anemia. EXAM: ABDOMEN - 1 VIEW COMPARISON:  None FINDINGS: Moderate fecal loading in the colon. Air-filled prominent loops of large and small bowel. IMPRESSION: Probable mild colonic ileus.  Moderate fecal loading in the colon. Electronically Signed   By: Dorise Bullion III M.D   On: 11/15/2019 17:37        Scheduled Meds: . arformoterol  15 mcg Nebulization BID  . benztropine  0.5 mg Oral BID  . budesonide (PULMICORT) nebulizer solution  0.25 mg Nebulization BID  . Chlorhexidine Gluconate Cloth  6 each Topical Daily  . docusate sodium  100 mg Oral BID  . feeding supplement (ENSURE ENLIVE)  237 mL Oral TID BM  . ferrous sulfate  325 mg Oral BID WC  . fluPHENAZine  15 mg Oral QHS  . fluPHENAZine  15 mg Oral Daily  . furosemide  40 mg Intravenous TID  . guaiFENesin  600 mg  Oral BID  . hydrocortisone  10 mg Oral BID  . hydroxychloroquine  200 mg Oral BID  . ipratropium-albuterol  3 mL Nebulization Q6H  . levothyroxine  200 mcg Oral Q0600  . metoprolol tartrate  50 mg Oral BID  . multivitamin with minerals  1 tablet Oral Daily  . pantoprazole  40 mg Oral Daily  . polyethylene glycol  17 g Oral Daily  . potassium chloride  40 mEq Oral TID  . senna  1 tablet Oral BID  . sodium chloride flush  10-40 mL Intracatheter Q12H  . tamsulosin  0.4 mg Oral Daily  . vitamin C  500 mg Oral BID   Continuous Infusions: . sodium chloride 10 mL/hr at 11/15/19 1423     LOS: 18 days    Time spent: 35 minutes    Sidney Ace, MD [facility] Pager [phone]  If 7PM-7AM, please contact night-coverage [email or web address] Password [email or web address] 11/17/2019, 1:44 PM

## 2019-11-17 NOTE — Progress Notes (Signed)
  Speech Language Pathology Treatment: Dysphagia  Patient Details Name: Jerry [name] MRN: [id number] DOB: 06/04/1956 Today's Date: 11/17/2019 Time: [id number] SLP Time Calculation (min) (ACUTE ONLY): 17 min  Assessment / Plan / Recommendation Clinical Impression  Pt visited today to assess toleration of current diet. Pt tolerated 4 sips of thin liquid without s/s of aspiration. Vocal quality remained clear. Pt refused further PO's. Pt noted to be confused, asking ST to call [facility] and schedule an appointment. Became agitated when ST told him the reason for the visit. Stated " Just do what I said". Rec continue with current Dysphagia 2 diet with thin liquids. Please reconsult if any further concerns of aspiration.   HPI HPI: Pt is a 63 y.o. 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 He had heme positive stools in the emergency room.  Patient is a long-term resident at [facility]. At baseline he does not ambulate.  He exhibited Confusion during this evaluation -- similar was noted per ED MD notes.       SLP Plan  Other (Comment)(F/u 2-3 days with Nsg)       Recommendations  Diet recommendations: Dysphagia 2 (fine chop) Medication Administration: Whole meds with puree Supervision: Staff to assist with self feeding Compensations: Slow rate;Small sips/bites Postural Changes and/or Swallow Maneuvers: Upright 30-60 min after meal;Seated upright 90 degrees                SLP Visit Diagnosis: Dysphagia, oropharyngeal phase (R13.12) Plan: Other (Comment)(F/u 2-3 days with Nsg)       GO                Lucila Maine 11/17/2019, 1:48 PM

## 2019-11-17 NOTE — Consult Note (Signed)
PHARMACY CONSULT NOTE  Pharmacy Consult for Electrolyte Monitoring and Replacement   Recent Labs: Potassium (mmol/L)  Date Value  11/17/2019 3.6  01/05/2013 3.8   Magnesium (mg/dL)  Date Value  11/17/2019 2.3   Calcium (mg/dL)  Date Value  11/17/2019 8.4 (L)   Calcium, Total (mg/dL)  Date Value  01/05/2013 8.5   Albumin (g/dL)  Date Value  11/07/2019 2.3 (L)  01/05/2013 2.9 (L)   Phosphorus (mg/dL)  Date Value  11/07/2019 3.9   Sodium (mmol/L)  Date Value  11/17/2019 140  01/05/2013 140   Corrected Ca: 9.9 mg/dL  Assessment: 63 y.o.malewith a known history of morbid obesity, severe rheumatoid arthritis on plaquenilwith multiple contractures, CHF diastolic, [name], chronic anemia, chronic thrombocytopenia, chronic pain due to rheumatoid arthritis admitted with anemia. Furosemide 40 mg IV TID started 12/18.  Goal of Therapy Given Cardiac History:  Potassium 4.0 - 5.1 mmol/L Magnesium 2.0 - 2.4 mg/dL All Other Electrolytes WNL  Plan:   KCL 59meq PO x 2  BMP in am  Jerry [name], [facility] Clinical Pharmacist 11/17/2019 7:22 AM

## 2019-11-18 LAB — CBC WITH DIFFERENTIAL/PLATELET
Abs Immature Granulocytes: 0.03 10*3/uL (ref 0.00–0.07)
Basophils Absolute: 0 10*3/uL (ref 0.0–0.1)
Basophils Relative: 0 %
Eosinophils Absolute: 0.3 10*3/uL (ref 0.0–0.5)
Eosinophils Relative: 5 %
HCT: 28.4 % — ABNORMAL LOW (ref 39.0–52.0)
Hemoglobin: 8.7 g/dL — ABNORMAL LOW (ref 13.0–17.0)
Immature Granulocytes: 1 %
Lymphocytes Relative: 17 %
Lymphs Abs: 1.1 10*3/uL (ref 0.7–4.0)
MCH: 23.6 pg — ABNORMAL LOW (ref 26.0–34.0)
MCHC: 30.6 g/dL (ref 30.0–36.0)
MCV: 77 fL — ABNORMAL LOW (ref 80.0–100.0)
Monocytes Absolute: 0.9 10*3/uL (ref 0.1–1.0)
Monocytes Relative: 13 %
Neutro Abs: 4.2 10*3/uL (ref 1.7–7.7)
Neutrophils Relative %: 64 %
Platelets: 284 10*3/uL (ref 150–400)
RBC: 3.69 MIL/uL — ABNORMAL LOW (ref 4.22–5.81)
RDW: 20.9 % — ABNORMAL HIGH (ref 11.5–15.5)
WBC: 6.6 10*3/uL (ref 4.0–10.5)
nRBC: 0 % (ref 0.0–0.2)

## 2019-11-18 LAB — BASIC METABOLIC PANEL
Anion gap: 10 (ref 5–15)
BUN: 11 mg/dL (ref 8–23)
CO2: 30 mmol/L (ref 22–32)
Calcium: 8.2 mg/dL — ABNORMAL LOW (ref 8.9–10.3)
Chloride: 98 mmol/L (ref 98–111)
Creatinine, Ser: 0.4 mg/dL — ABNORMAL LOW (ref 0.61–1.24)
GFR calc Af Amer: >60 mL/min (ref >60)
GFR calc non Af Amer: >60 mL/min (ref >60)
Glucose, Bld: 102 mg/dL — ABNORMAL HIGH (ref 70–99)
Potassium: 3.8 mmol/L (ref 3.5–5.1)
Sodium: 138 mmol/L (ref 135–145)

## 2019-11-18 MED ORDER — MIRTAZAPINE 15 MG PO TABS
15.0000 mg | ORAL_TABLET | Freq: Every day | ORAL | Status: DC
  Administered 2019-11-18 – 2019-11-19 (×2): 15 mg via ORAL
  Filled 2019-11-18 (×2): qty 1

## 2019-11-18 MED ORDER — NEPRO/CARBSTEADY PO LIQD
237.0000 mL | Freq: Three times a day (TID) | ORAL | Status: DC
  Administered 2019-11-18 – 2019-11-19 (×3): 237 mL via ORAL

## 2019-11-18 MED ORDER — POTASSIUM CHLORIDE CRYS ER 20 MEQ PO TBCR
40.0000 meq | EXTENDED_RELEASE_TABLET | Freq: Once | ORAL | Status: AC
  Administered 2019-11-18: 40 meq via ORAL
  Filled 2019-11-18: qty 2

## 2019-11-18 NOTE — Progress Notes (Signed)
PROGRESS NOTE    Artha Voncannon  [id number] DOB: 1956-10-05 DOA: 10/30/2019 PCP: [name], MD   Brief Narrative:   [name] a40 y.o.malewith a known history of morbid obesity, severe rheumatoid arthritis on plaquenilwith multiple contractures, CHF diastolic, [name], chronic anemia, chronic thrombocytopenia, chronic pain due to rheumatoid arthritis comes to the emergency room from cancer center after he was found to have hemoglobin of 6.8.  12/16: Patient found to have worsening shortness of breath associated with low-grade temperature and increasing oxygen demands.  History of congestive heart failure diastolic.  Per documentation patient was previously encephalopathic so appears to be improving slowly from the standpoint.  12/17: Symptomatically improved.  Diuresed appropriately.  Respiratory rate improved.  On 2 and half to 3 L nasal cannula.  Blood pressure improved.  12/18: Diuresing well.  2 to 3 L nasal cannula.  Respiratory rate improved.  Blood pressures remain labile.  12/19: Continues to diurese well however remains on 3 L nasal cannula.  Respiratory rate labile.  Seems unable to clear secretions.  Blood pressure is also labile.  Thought process more convoluted this morning.  12/20: Continue to diurese.  Respiratory status is showing some signs of improvement.  Blood pressure remains labile.  12/21: Respiratory status appears improved.  Thought process more clear.  BP remains labile.  12/22: Respiratory status stable over interval.  Seems more confused this morning.  Assessment & Plan:   Active Problems:   Obesity, Class III, BMI 40-49.9 (morbid obesity) (HCC)   Primary adrenocortical insufficiency (HCC)   Anemia   Chronic obstructive pulmonary disease (HCC)   Schizo affective schizophrenia (HCC)   Mass of colon   Palliative care encounter   Colon adenocarcinoma ([place])   DNR (do not resuscitate)   Chronic diastolic CHF (congestive heart  failure) (HCC)   Rheumatoid arthritis (HCC)   Chronic respiratory failure with hypoxia (HCC)  Acute on chronic hypoxic respiratory failure Patient is noted to have increased work of breathing associated with an increase in oxygen demand Patient intermittently becomes significantly tachycardic and has difficulty controlling his secretions burden Nebulizers seem limited in efficacy Stat bedside chest x-ray demonstrates evidence of interstitial edema more consistent with decompensated congestive heart failure rather than an infectious process Patient seemed to respond well to the diuresis Patient is refusing CPAP mask at night, apparently unable to tolerate ECHO: EF 60 to 65%, no obvious right ventricular dysfunction or impaired relaxation however study was limited due to body habitus  As of 11/18/2019 the patient is 10 L net negative.  Echocardiogram did not demonstrate any overt reduced ejection fraction or diastolic dysfunction.  No overt change in average oxygen saturations at the patient requires between 2 and 3 L nasal cannula at all times which appears to be his baseline. Patient continues to refuse nocturnal CPAP therapy which I suspect will be the most useful for him however he claims he cannot tolerate the mask and refused to wear it. Plan: DC Lasix DC Foley.  Voiding trial.  Every 6 hours bladder scan postvoid as needed straight cath Reassess oxygen status in the morning.  Patient remains on 2 to 3 L nasal cannula can consider discharge back to long-term care facility at that time Continue aggressive bronchodilator therapy Daily weightst. No abx at this time Continue incentive spirometry, flutter valve, aggressive pulmonary toileting   Acute on chronic anemia with heme positive stools secondary to newly diagnosed colon cancer -patient s/p 2 units of blood transfusion -Hemoglobin was 6.8 on  admission.  Since that time, hemoglobins have stabilized. -He is on PPI IV twice daily -G.I.  consultation with Dr. Mickie Hillier appreciated -EGD-- within normal limits -colonoscopy--An ulcerated non-obstructing large mass was found in the proximal  ascending colon. The mass was partially circumferential (involving  one-half of the lumen circumference). The mass measured one cm in  length. t -Biopsy of mass confirms invasive adenocarcinoma -patient is followed closely for his anemia at the cancer center by Dr. Rogue Bussing -Dr. Lysle Pearl from surgery consulted.  Family is unsure whether they wish to proceed with surgical resection -Palliative care consultation had been obtained, appreciate assistance -Surgery will consider outpatient Port-A-Cath placement since patient has very poor venous access.   -After discussion with oncology, staging CTs were ordered that did not show any evidence of metastatic disease. -Patient will follow-up with oncology and palliative care as an outpatient to discuss further goals of treatment   COPD on chronic home oxygen at 2 L -continue inhalers as needed - See above  Chronic diastolic congestive heart failure  EF 60% by echo in 2018.   Volume status difficult to assess given body habitus Repeat echo continues to demonstrate EF [id number] without diastolic dysfunction   Chronic thrombocytopenia -continue to monitor  BPH on Flomax  Rheumatoid arthritis. On chronic hydroxychloroquine and chronic hydrocortisone.    At baseline patient is bed bound at baseline and is from a long term facility  DVT prophylaxis: SCDs Code Status: DNR Family Communication: none today Disposition Plan: Return to long-term care facility   Consultants:   Palliative care  Procedures:   none  Antimicrobials:  Cefepime (stopped)  Flagyl (stopped)   Subjective: Seen and examined Shortness of breath improving Patient is more communicative today, but convoluted thought process  Objective: Vitals:   11/18/19 0805 11/18/19 0848 11/18/19  1208 11/18/19 1338  BP:  136/76 (!) 128/57   Pulse:  82 (!) 102   Resp:   18   Temp:   97.8 F (36.6 C)   TempSrc:      SpO2: 100%  100% 100%  Weight:      Height:        Intake/Output Summary (Last 24 hours) at 11/18/2019 1524 Last data filed at 11/18/2019 1400 Gross per 24 hour  Intake 190 ml  Output 2100 ml  Net -1910 ml   Filed Weights   10/31/19 1325 11/01/19 2336 11/04/19 1032  Weight: (!) 153.3 kg (!) 157.1 kg (!) 157.1 kg    Examination:  General exam: Alert, awake, no distress Respiratory system: Increased rhonchi and wheezing bilaterally.  Mild increase in respiratory effort. Cardiovascular system:RRR. No murmurs, rubs, gallops. Gastrointestinal system: Abdomen is nondistended, soft and nontender. No organomegaly or masses felt. Normal bowel sounds heard. Central nervous system:  No focal neurological deficits. Extremities: Changes consistent with severe rheumatoid arthritis in hands. Skin: No rashes, lesions or ulcers Psychiatry: Confused    Data Reviewed: I have personally reviewed following labs and imaging studies  CBC: Recent Labs  Lab 11/12/19 0625 11/13/19 0923 11/18/19 0730  WBC 10.8* 9.2 6.6  NEUTROABS  --  6.7 4.2  HGB 9.1* 8.4* 8.7*  HCT 30.5* 28.0* 28.4*  MCV 79.6* 79.3* 77.0*  PLT 318 285 [id number]   Basic Metabolic Panel: Recent Labs  Lab 11/13/19 0923 11/14/19 0434 11/15/19 0459 11/16/19 0631 11/17/19 0530 11/18/19 0730  NA 135 136 138  --  140 138  K 4.0 4.2 3.7 3.5 3.6 3.8  CL 98 98 98  --  100  98  CO2 25 28 32  --  27 30  GLUCOSE 74 114* 108*  --  107* 102*  BUN 17 16 14   --  12 11  CREATININE 0.57* 0.58* 0.50*  --  0.45* 0.40*  CALCIUM 8.7* 8.5* 8.3*  --  8.4* 8.2*  MG  --  2.3  --  2.2 2.3  --    GFR: Estimated Creatinine Clearance: 148 mL/min (A) (by C-G formula based on SCr of 0.4 mg/dL (L)). Liver Function Tests: No results for input(s): AST, ALT, ALKPHOS, BILITOT, PROT, ALBUMIN in the last 168 hours. No results for  input(s): LIPASE, AMYLASE in the last 168 hours. No results for input(s): AMMONIA in the last 168 hours. Coagulation Profile: No results for input(s): INR, PROTIME in the last 168 hours. Cardiac Enzymes: No results for input(s): CKTOTAL, CKMB, CKMBINDEX, TROPONINI in the last 168 hours. BNP (last 3 results) No results for input(s): PROBNP in the last 8760 hours. HbA1C: No results for input(s): HGBA1C in the last 72 hours. CBG: No results for input(s): GLUCAP in the last 168 hours. Lipid Profile: No results for input(s): CHOL, HDL, LDLCALC, TRIG, CHOLHDL, LDLDIRECT in the last 72 hours. Thyroid Function Tests: No results for input(s): TSH, T4TOTAL, FREET4, T3FREE, THYROIDAB in the last 72 hours. Anemia Panel: No results for input(s): VITAMINB12, FOLATE, FERRITIN, TIBC, IRON, RETICCTPCT in the last 72 hours. Sepsis Labs: No results for input(s): PROCALCITON, LATICACIDVEN in the last 168 hours.  No results found for this or any previous visit (from the past 240 hour(s)).       Radiology Studies: No results found.      Scheduled Meds: . arformoterol  15 mcg Nebulization BID  . benztropine  0.5 mg Oral BID  . budesonide (PULMICORT) nebulizer solution  0.25 mg Nebulization BID  . Chlorhexidine Gluconate Cloth  6 each Topical Daily  . docusate sodium  100 mg Oral BID  . feeding supplement (NEPRO CARB STEADY)  237 mL Oral TID BM  . ferrous sulfate  325 mg Oral BID WC  . fluPHENAZine  15 mg Oral QHS  . fluPHENAZine  15 mg Oral Daily  . guaiFENesin  600 mg Oral BID  . hydrocortisone  10 mg Oral BID  . hydroxychloroquine  200 mg Oral BID  . ipratropium-albuterol  3 mL Nebulization Q6H  . levothyroxine  200 mcg Oral Q0600  . metoprolol tartrate  50 mg Oral BID  . multivitamin with minerals  1 tablet Oral Daily  . pantoprazole  40 mg Oral Daily  . polyethylene glycol  17 g Oral Daily  . potassium chloride  40 mEq Oral Once  . senna  1 tablet Oral BID  . sodium chloride flush   10-40 mL Intracatheter Q12H  . tamsulosin  0.4 mg Oral Daily  . vitamin C  500 mg Oral BID   Continuous Infusions: . sodium chloride 10 mL/hr at 11/15/19 1423     LOS: 19 days    Time spent: 35 minutes    Sidney Ace, MD [facility] Pager [phone]  If 7PM-7AM, please contact night-coverage [email or web address] Password [facility] 11/18/2019, 3:24 PM

## 2019-11-18 NOTE — Progress Notes (Signed)
Nutrition Follow Up Note   DOCUMENTATION CODES:   Morbid obesity  INTERVENTION:   Nepro Shake po TID, each supplement provides 425 kcal and 19 grams protein  Magic cup TID with meals, each supplement provides 290 kcal and 9 grams of protein  MVI daily   Recommend consider appetite stimulant   NUTRITION DIAGNOSIS:   Increased nutrient needs related to chronic illness(COPD, CHF, colon cancer) as evidenced by increased estimated needs.  GOAL:   Patient will meet greater than or equal to 90% of their needs  -not met  MONITOR:   PO intake, Supplement acceptance, Labs, Weight trends, Skin, I & O's  ASSESSMENT:   63 y/o male with h/o morbid obesity, schizophrenia, severe RA on Plaquenil with multiple contractures, diastolic dysfunction with history of recurrent CHF, O2 dependent COPD, chronic anemia and thrombocytopenia, who presented to the ER on 10/30/2019 from the [facility] with symptomatic anemia.  Patient was also found to have heme positive stools.  Patient underwent colonoscopy and was found to have a nonobstructing mass in the ascending colon highly concerning for malignancy. Biopsy of mass confirms invasive adenocarcinoma  Pt with poor appetite and oral intake; pt eating only sips and bites of meals. Pt refusing all Ensure supplements today per RN. Pt continues to have intermittent confusion. Will change Ensure over to Nepro as this will provide different flavors. Magic Cups being sent on meal trays. Spoke with MD, will start mirtazapine for appetite stimulant. SLP following. Palliative care following. Type 2 BM noted on 12/20; recommend bowel regimen as needed per MD. No new weight since 12/8; will request weekly weights.   Medications reviewed and include: colace, ferrous sulfate, synthroid, MVI, protonix, miralax, KCl, senokot, vitamin C   Labs reviewed: creat 0.40(L) Hgb 8.7(L), hematocrit 28.4(L), MCV 77.0(L), MCHC 30.6(L)   Diet Order:   Diet Order           DIET DYS 2 Room service appropriate? Yes with Assist; Fluid consistency: Thin  Diet effective now        Diet - low sodium heart healthy             EDUCATION NEEDS:   Education needs have been addressed  Skin:  Skin Assessment: Reviewed RN Assessment  Last BM:  12/20- type 2  Height:   Ht Readings from Last 1 Encounters:  11/04/19 6' 2" (1.88 m)    Weight:   Wt Readings from Last 1 Encounters:  11/04/19 (!) 157.1 kg    Ideal Body Weight:  86.3 kg  BMI:  Body mass index is 44.48 kg/m.  Estimated Nutritional Needs:   Kcal:  2700-2000kcal/day  Protein:  >130g/day  Fluid:  >2.5L/day  Koleen Distance MS, RD, LDN Pager #- [phone] Office#- [phone] After Hours Pager: [phone]

## 2019-11-18 NOTE — Consult Note (Signed)
PHARMACY CONSULT NOTE  Pharmacy Consult for Electrolyte Monitoring and Replacement   Recent Labs: Potassium (mmol/L)  Date Value  11/18/2019 3.8  01/05/2013 3.8   Magnesium (mg/dL)  Date Value  11/17/2019 2.3   Calcium (mg/dL)  Date Value  11/18/2019 8.2 (L)   Calcium, Total (mg/dL)  Date Value  01/05/2013 8.5   Albumin (g/dL)  Date Value  11/07/2019 2.3 (L)  01/05/2013 2.9 (L)   Phosphorus (mg/dL)  Date Value  11/07/2019 3.9   Sodium (mmol/L)  Date Value  11/18/2019 138  01/05/2013 140   Corrected Ca: 9.9 mg/dL  Assessment: 63 y.o.malewith a known history of morbid obesity, severe rheumatoid arthritis on plaquenilwith multiple contractures, CHF diastolic, [name], chronic anemia, chronic thrombocytopenia, chronic pain due to rheumatoid arthritis admitted with anemia. Furosemide 40 mg IV TID started 12/18 and stopped 12/22  Goal of Therapy Given Cardiac History:  Potassium 4.0 - 5.1 mmol/L Magnesium 2.0 - 2.4 mg/dL All Other Electrolytes WNL  Plan:   KCL 34meq PO x 1  BMP in am  [name], [facility] Clinical Pharmacist 11/18/2019 3:13 PM

## 2019-11-19 LAB — CBC WITH DIFFERENTIAL/PLATELET
Abs Immature Granulocytes: 0.03 10*3/uL (ref 0.00–0.07)
Basophils Absolute: 0 10*3/uL (ref 0.0–0.1)
Basophils Relative: 1 %
Eosinophils Absolute: 0.4 10*3/uL (ref 0.0–0.5)
Eosinophils Relative: 5 %
HCT: 29 % — ABNORMAL LOW (ref 39.0–52.0)
Hemoglobin: 8.8 g/dL — ABNORMAL LOW (ref 13.0–17.0)
Immature Granulocytes: 0 %
Lymphocytes Relative: 19 %
Lymphs Abs: 1.3 10*3/uL (ref 0.7–4.0)
MCH: 23.3 pg — ABNORMAL LOW (ref 26.0–34.0)
MCHC: 30.3 g/dL (ref 30.0–36.0)
MCV: 76.7 fL — ABNORMAL LOW (ref 80.0–100.0)
Monocytes Absolute: 0.8 10*3/uL (ref 0.1–1.0)
Monocytes Relative: 12 %
Neutro Abs: 4.3 10*3/uL (ref 1.7–7.7)
Neutrophils Relative %: 63 %
Platelets: 309 10*3/uL (ref 150–400)
RBC: 3.78 MIL/uL — ABNORMAL LOW (ref 4.22–5.81)
RDW: 20.9 % — ABNORMAL HIGH (ref 11.5–15.5)
WBC: 6.8 10*3/uL (ref 4.0–10.5)
nRBC: 0 % (ref 0.0–0.2)

## 2019-11-19 LAB — BASIC METABOLIC PANEL
Anion gap: 8 (ref 5–15)
BUN: 9 mg/dL (ref 8–23)
CO2: 32 mmol/L (ref 22–32)
Calcium: 8.5 mg/dL — ABNORMAL LOW (ref 8.9–10.3)
Chloride: 98 mmol/L (ref 98–111)
Creatinine, Ser: 0.4 mg/dL — ABNORMAL LOW (ref 0.61–1.24)
GFR calc Af Amer: >60 mL/min (ref >60)
GFR calc non Af Amer: >60 mL/min (ref >60)
Glucose, Bld: 94 mg/dL (ref 70–99)
Potassium: 4.1 mmol/L (ref 3.5–5.1)
Sodium: 138 mmol/L (ref 135–145)

## 2019-11-19 LAB — GLUCOSE, CAPILLARY: Glucose-Capillary: 85 mg/dL (ref 70–99)

## 2019-11-19 LAB — SARS CORONAVIRUS 2 (TAT 6-24 HRS): SARS Coronavirus 2: NEGATIVE

## 2019-11-19 MED ORDER — GUAIFENESIN-DM 100-10 MG/5ML PO SYRP
5.0000 mL | ORAL_SOLUTION | ORAL | Status: DC | PRN
  Administered 2019-11-19: 5 mL via ORAL
  Filled 2019-11-19: qty 5

## 2019-11-19 NOTE — Progress Notes (Addendum)
PROGRESS NOTE    Jerry [name]  [id number] DOB: 1956/10/26 DOA: 10/30/2019 PCP: [name], MD   Brief Narrative:  63 year old with history of morbid obesity, severe rheumatoid arthritis, diastolic CHF, [name], chronic anemia, thrombocytopenia came to the ER from the cancer center for hemoglobin of 6.8.  Received 2 units PRBC transfusion.  Developed some fluid overload requiring IV Lasix/diuretics.   Assessment & Plan:   Active Problems:   Obesity, Class III, BMI 40-49.9 (morbid obesity) (HCC)   Primary adrenocortical insufficiency (HCC)   Anemia   Chronic obstructive pulmonary disease (HCC)   Schizo affective schizophrenia (HCC)   Mass of colon   Palliative care encounter   Colon adenocarcinoma ([place])   DNR (do not resuscitate)   Chronic diastolic CHF (congestive heart failure) (HCC)   Rheumatoid arthritis (HCC)   Chronic respiratory failure with hypoxia (HCC)   Acute on chronic hypoxic respiratory failure Acute congestive heart failure with preserved ejection fraction, 60% Chronic hypoxia due to history of COPD, 2 L nasal cannula at home -Most of the above issue seems to have resolved.  Echocardiogram ejection fraction 60 to 65%. -Incentive spirometer, flutter valve bronchodilators -Diuretics as needed.  Daily weights -Supplemental oxygen  Anemia of chronic disease with Hemoccult positive stool Ulcerated nonobstructing large proximal ascending colon mass, invasive colonic adenocarcinoma -EGD is normal but your colonoscopy shows nonobstructive large mass -Biopsy confirms invasive adenocarcinoma -Surgery and oncology team following.  Anemia of chronic disease -Monitor.  Currently at baseline 9.0  BPH -Flomax  Left arthritis -On hydroxychloroquine, hydrocortisone  DVT prophylaxis: SCDs Code Status: DNR Family Communication: Called [name], [phone]. She didn't answer and her voicemail box is full.  Disposition Plan: [facility]  Plan on getting COVID-19 testing today, once resulted patient can be discharged back to his facility.   Subjective: Still somewhat disoriented but does not appear to be in any acute distress.  Denies any shortness of breath.  Review of Systems Otherwise negative except as per HPI, including: General: Denies fever, chills, night sweats or unintended weight loss. Resp: Denies cough, wheezing, shortness of breath. Cardiac: Denies chest pain, palpitations, orthopnea, paroxysmal nocturnal dyspnea. GI: Denies abdominal pain, nausea, vomiting, diarrhea or constipation GU: Denies dysuria, frequency, hesitancy or incontinence MS: Denies muscle aches, joint pain or swelling Neuro: Denies headache, neurologic deficits (focal weakness, numbness, tingling), abnormal gait Psych: Denies anxiety, depression, SI/HI/AVH Skin: Denies new rashes or lesions ID: Denies sick contacts, exotic exposures, travel  Objective: Vitals:   11/19/19 0114 11/19/19 0204 11/19/19 0705 11/19/19 0727  BP: 123/77  126/77   Pulse: 100  (!) 101   Resp: 18  18   Temp: 98.5 F (36.9 C)  98.5 F (36.9 C)   TempSrc:      SpO2: 100% 100% 100% 99%  Weight:      Height:        Intake/Output Summary (Last 24 hours) at 11/19/2019 0839 Last data filed at 11/18/2019 1400 Gross per 24 hour  Intake 130 ml  Output 450 ml  Net -320 ml   Filed Weights   11/01/19 2336 11/04/19 1032 11/18/19 1510  Weight: (!) 157.1 kg (!) 157.1 kg (!) 141.2 kg    Examination:  General exam: Appears calm and comfortable, 2 L nasal cannula Respiratory system: Clear to auscultation. Respiratory effort normal. Cardiovascular system: S1 & S2 heard, RRR. No JVD, murmurs, rubs, gallops or clicks. No pedal edema. Gastrointestinal system: Abdomen is nondistended, soft and nontender.  No organomegaly or masses felt. Normal bowel sounds heard. Central nervous system: Awake and alert to name but does not answer most of the questions  appropriately.  No focal neuro deficits Extremities: Symmetric 4 x 5 power. Skin: No rashes, lesions or ulcers Psychiatry: Having some delusions/hallucination, poor judgment and insight.  Difficult to assess his mood.    Data Reviewed:   CBC: Recent Labs  Lab 11/13/19 0923 11/18/19 0730 11/19/19 0524  WBC 9.2 6.6 6.8  NEUTROABS 6.7 4.2 4.3  HGB 8.4* 8.7* 8.8*  HCT 28.0* 28.4* 29.0*  MCV 79.3* 77.0* 76.7*  PLT 285 284 [id number]   Basic Metabolic Panel: Recent Labs  Lab 11/14/19 0434 11/15/19 0459 11/16/19 0631 11/17/19 0530 11/18/19 0730 11/19/19 0524  NA 136 138  --  140 138 138  K 4.2 3.7 3.5 3.6 3.8 4.1  CL 98 98  --  100 98 98  CO2 28 32  --  27 30 32  GLUCOSE 114* 108*  --  107* 102* 94  BUN 16 14  --  12 11 9   CREATININE 0.58* 0.50*  --  0.45* 0.40* 0.40*  CALCIUM 8.5* 8.3*  --  8.4* 8.2* 8.5*  MG 2.3  --  2.2 2.3  --   --    GFR: Estimated Creatinine Clearance: 139.6 mL/min (A) (by C-G formula based on SCr of 0.4 mg/dL (L)). Liver Function Tests: No results for input(s): AST, ALT, ALKPHOS, BILITOT, PROT, ALBUMIN in the last 168 hours. No results for input(s): LIPASE, AMYLASE in the last 168 hours. No results for input(s): AMMONIA in the last 168 hours. Coagulation Profile: No results for input(s): INR, PROTIME in the last 168 hours. Cardiac Enzymes: No results for input(s): CKTOTAL, CKMB, CKMBINDEX, TROPONINI in the last 168 hours. BNP (last 3 results) No results for input(s): PROBNP in the last 8760 hours. HbA1C: No results for input(s): HGBA1C in the last 72 hours. CBG: Recent Labs  Lab 11/19/19 0111  GLUCAP 85   Lipid Profile: No results for input(s): CHOL, HDL, LDLCALC, TRIG, CHOLHDL, LDLDIRECT in the last 72 hours. Thyroid Function Tests: No results for input(s): TSH, T4TOTAL, FREET4, T3FREE, THYROIDAB in the last 72 hours. Anemia Panel: No results for input(s): VITAMINB12, FOLATE, FERRITIN, TIBC, IRON, RETICCTPCT in the last 72 hours. Sepsis  Labs: No results for input(s): PROCALCITON, LATICACIDVEN in the last 168 hours.  No results found for this or any previous visit (from the past 240 hour(s)).       Radiology Studies: No results found.      Scheduled Meds: . arformoterol  15 mcg Nebulization BID  . benztropine  0.5 mg Oral BID  . budesonide (PULMICORT) nebulizer solution  0.25 mg Nebulization BID  . Chlorhexidine Gluconate Cloth  6 each Topical Daily  . docusate sodium  100 mg Oral BID  . feeding supplement (NEPRO CARB STEADY)  237 mL Oral TID BM  . ferrous sulfate  325 mg Oral BID WC  . fluPHENAZine  15 mg Oral QHS  . fluPHENAZine  15 mg Oral Daily  . guaiFENesin  600 mg Oral BID  . hydrocortisone  10 mg Oral BID  . hydroxychloroquine  200 mg Oral BID  . ipratropium-albuterol  3 mL Nebulization Q6H  . levothyroxine  200 mcg Oral Q0600  . metoprolol tartrate  50 mg Oral BID  . mirtazapine  15 mg Oral QHS  . multivitamin with minerals  1 tablet Oral Daily  . pantoprazole  40 mg Oral Daily  .  polyethylene glycol  17 g Oral Daily  . senna  1 tablet Oral BID  . sodium chloride flush  10-40 mL Intracatheter Q12H  . tamsulosin  0.4 mg Oral Daily  . vitamin C  500 mg Oral BID   Continuous Infusions: . sodium chloride 10 mL/hr at 11/15/19 1423     LOS: 20 days   Time spent= 35 mins    Amoni Morales Arsenio Loader, MD [facility]  If 7PM-7AM, please contact night-coverage  11/19/2019, 8:39 AM

## 2019-11-19 NOTE — Consult Note (Signed)
PHARMACY CONSULT NOTE  Pharmacy Consult for Electrolyte Monitoring and Replacement   Recent Labs: Potassium (mmol/L)  Date Value  11/19/2019 4.1  01/05/2013 3.8   Magnesium (mg/dL)  Date Value  11/17/2019 2.3   Calcium (mg/dL)  Date Value  11/19/2019 8.5 (L)   Calcium, Total (mg/dL)  Date Value  01/05/2013 8.5   Albumin (g/dL)  Date Value  11/07/2019 2.3 (L)  01/05/2013 2.9 (L)   Phosphorus (mg/dL)  Date Value  11/07/2019 3.9   Sodium (mmol/L)  Date Value  11/19/2019 138  01/05/2013 140   Corrected Ca: 9.9 mg/dL  Assessment: 63 y.o.malewith a known history of morbid obesity, severe rheumatoid arthritis on plaquenilwith multiple contractures, CHF diastolic, [name], chronic anemia, chronic thrombocytopenia, chronic pain due to rheumatoid arthritis admitted with anemia. Furosemide 40 mg IV TID started 12/18 and stopped 12/22  Goal of Therapy Given Cardiac History:  Potassium 4.0 - 5.1 mmol/L Magnesium 2.0 - 2.4 mg/dL All Other Electrolytes WNL  Plan:   No electrolyte replacement warranted today  BMP in am  [name], [place] Pharmacist 11/19/2019 7:20 AM

## 2019-11-19 NOTE — TOC Progression Note (Signed)
Transition of Care [facility]) - Progression Note    Patient Details  Name: Jerry [name] MRN: [id number] Date of Birth: 03-22-1956  Transition of Care [facility]) CM/SW Contact  Beverly Sessions, RN Phone Number: 11/19/2019, 2:42 PM  Clinical Narrative:    Repeat covid test sent today Anticipate discharge tomorrow  [facility]   Expected Discharge Plan: Long Term Nursing Home Barriers to Discharge: Continued Medical Work up  Expected Discharge Plan and Services Expected Discharge Plan: [place] In-house Referral: Clinical Social Work     Living arrangements for the past 2 months: [place] Expected Discharge Date: 11/08/19                                     Social Determinants of Health (SDOH) Interventions    Readmission Risk Interventions Readmission Risk Prevention Plan 11/03/2019  Transportation Screening Complete  HRI or [place] Not Complete  HRI or Home Care Consult comments Pt lives at a [place] [facility] [place] Planning/Counseling [place] Not Applicable  Medication Review [employer]) Complete  Some recent data might be hidden

## 2019-11-20 LAB — CBC WITH DIFFERENTIAL/PLATELET
Abs Immature Granulocytes: 0.03 10*3/uL (ref 0.00–0.07)
Basophils Absolute: 0 10*3/uL (ref 0.0–0.1)
Basophils Relative: 1 %
Eosinophils Absolute: 0.3 10*3/uL (ref 0.0–0.5)
Eosinophils Relative: 5 %
HCT: 28.3 % — ABNORMAL LOW (ref 39.0–52.0)
Hemoglobin: 8.3 g/dL — ABNORMAL LOW (ref 13.0–17.0)
Immature Granulocytes: 1 %
Lymphocytes Relative: 18 %
Lymphs Abs: 1.2 10*3/uL (ref 0.7–4.0)
MCH: 23.5 pg — ABNORMAL LOW (ref 26.0–34.0)
MCHC: 29.3 g/dL — ABNORMAL LOW (ref 30.0–36.0)
MCV: 80.2 fL (ref 80.0–100.0)
Monocytes Absolute: 0.8 10*3/uL (ref 0.1–1.0)
Monocytes Relative: 12 %
Neutro Abs: 4.3 10*3/uL (ref 1.7–7.7)
Neutrophils Relative %: 63 %
Platelets: 271 10*3/uL (ref 150–400)
RBC: 3.53 MIL/uL — ABNORMAL LOW (ref 4.22–5.81)
RDW: 20.8 % — ABNORMAL HIGH (ref 11.5–15.5)
WBC: 6.6 10*3/uL (ref 4.0–10.5)
nRBC: 0 % (ref 0.0–0.2)

## 2019-11-20 LAB — BASIC METABOLIC PANEL
Anion gap: 7 (ref 5–15)
BUN: 9 mg/dL (ref 8–23)
CO2: 31 mmol/L (ref 22–32)
Calcium: 8.3 mg/dL — ABNORMAL LOW (ref 8.9–10.3)
Chloride: 101 mmol/L (ref 98–111)
Creatinine, Ser: 0.42 mg/dL — ABNORMAL LOW (ref 0.61–1.24)
GFR calc Af Amer: >60 mL/min (ref >60)
GFR calc non Af Amer: >60 mL/min (ref >60)
Glucose, Bld: 110 mg/dL — ABNORMAL HIGH (ref 70–99)
Potassium: 3.9 mmol/L (ref 3.5–5.1)
Sodium: 139 mmol/L (ref 135–145)

## 2019-11-20 MED ORDER — ARFORMOTEROL TARTRATE 15 MCG/2ML IN NEBU: 15.0000 ug | INHALATION_SOLUTION | Freq: Two times a day (BID) | RESPIRATORY_TRACT | Status: DC

## 2019-11-20 MED ORDER — LORAZEPAM 2 MG PO TABS: 2.0000 mg | ORAL_TABLET | Freq: Four times a day (QID) | ORAL | 0 refills | Status: DC | PRN

## 2019-11-20 MED ORDER — TRAMADOL HCL 50 MG PO TABS: 50.0000 mg | ORAL_TABLET | Freq: Four times a day (QID) | ORAL | 0 refills | Status: DC | PRN

## 2019-11-20 NOTE — Discharge Summary (Addendum)
Physician Discharge Summary  Jerry [name] [id number] DOB: 05-May-1956 DOA: 10/30/2019  PCP: [name], MD  Admit date: 10/30/2019 Discharge date: 11/20/2019  [facility] Disposition:   [facility]  Recommendations for Outpatient Follow-up:  1. Follow up with PCP in 1-2 weeks 2. Please obtain BMP/CBC in one week your next doctors visit.  3. Follow-up outpatient general surgery and oncology 4. Outpatient palliative care referral 5. Bronchodilators   Discharge Condition: Stable CODE STATUS: DNR Diet recommendation: 2 g salt  Brief/Interim Summary: 63 year old with history of morbid obesity, severe rheumatoid arthritis, diastolic CHF, [name], chronic anemia, thrombocytopenia came to the ER from the cancer center for hemoglobin of 6.8.  Received 2 units PRBC transfusion.  Developed some fluid overload requiring IV Lasix/diuretics.  During the hospital stay he was Hemoccult positive therefore underwent endoscopy which was normal but a colonoscopy showed nonobstructive right colon large mass.  Biopsy was eventually positive for adenocarcinoma.  Case was discussed with oncology and general surgery who recommended he will need surgical intervention with oncology follow-up but given his extensive comorbidities, body habitus it was determined he was very high risk.  Seen by palliative care surgery as well. Eventually he was medically stable to be discharged with close outpatient follow-up as recommended above.  Currently reached maximum benefit from hospital stay and stable for discharge. Spoke with [name], she is updated on the day discharge. She is very appreciative of his care.   Discharge Diagnoses:  Active Problems:   Obesity, Class III, BMI 40-49.9 (morbid obesity) (HCC)   Primary adrenocortical insufficiency (HCC)   Anemia   Chronic obstructive pulmonary disease (HCC)   Schizo affective schizophrenia (HCC)   Mass of colon   Palliative care  encounter   Colon adenocarcinoma ([place])   DNR (do not resuscitate)   Chronic diastolic CHF (congestive heart failure) (HCC)   Rheumatoid arthritis (HCC)   Chronic respiratory failure with hypoxia (HCC)  Acute on chronic hypoxic respiratory failure, resolved Acute congestive heart failure with preserved ejection fraction, 60% Chronic hypoxia due to history of COPD, 2 L nasal cannula at home -Breathing is at baseline..  Echocardiogram ejection fraction 60 to 65%. -Incentive spirometer, flutter valve bronchodilators -Diuretics as needed.  Daily weights -Supplemental oxygen Advised to closely monitor his respiratory symptoms while at the rehab facility, poor reserve.  Given his comorbidities, and body habitus he is a high risk patient.  Anemia of chronic disease with Hemoccult positive stool Ulcerated nonobstructing large proximal ascending colon mass, invasive colonic adenocarcinoma -EGD is normal but your colonoscopy shows nonobstructive large mass -Biopsy confirms invasive adenocarcinoma -Surgery and oncology team following.  Anemia of chronic disease -Monitor.  Currently at baseline 9.0  BPH -Flomax  Left arthritis -On hydroxychloroquine, hydrocortisone  Subjective: Remains somewhat disoriented but this is his baseline.  Denies any complaints.  Discharge Exam: Vitals:   11/20/19 0228 11/20/19 0529  BP:  (!) 149/88  Pulse:  (!) 102  Resp:  18  Temp:  98.8 F (37.1 C)  SpO2: 100% 100%   Vitals:   11/19/19 1933 11/19/19 1942 11/20/19 0228 11/20/19 0529  BP:  122/62  (!) 149/88  Pulse:  (!) 103  (!) 102  Resp:  18  18  Temp:  99.4 F (37.4 C)  98.8 F (37.1 C)  TempSrc:  Oral  Oral  SpO2: 100% 100% 100% 100%  Weight:      Height:        General: Pt is alert, awake, not  in acute distress Cardiovascular: RRR, S1/S2 +, no rubs, no gallops Respiratory: Mild rhonchi heard anteriorly Abdominal: Soft, NT, ND, bowel sounds + Extremities: no edema, no  cyanosis AAOx2 (baseline) Poor judgment and insight  Discharge Instructions  Discharge Instructions    Amb Referral to Palliative Care   Complete by: As directed    Diet - low sodium heart healthy   Complete by: As directed    Diet - low sodium heart healthy   Complete by: As directed    Discharge instructions   Complete by: As directed    You were cared for by a hospitalist during your hospital stay. If you have any questions about your discharge medications or the care you received while you were in the hospital after you are discharged, you can call the unit and asked to speak with the hospitalist on call if the hospitalist that took care of you is not available. Once you are discharged, your primary care physician will handle any further medical issues. Please note that NO REFILLS for any discharge medications will be authorized once you are discharged, as it is imperative that you return to your primary care physician (or establish a relationship with a primary care physician if you do not have one) for your aftercare needs so that they can reassess your need for medications and monitor your lab values.  Please request your Prim.MD to go over all Hospital Tests and Procedure/Radiological results at the follow up, please get all Hospital records sent to your Prim MD by signing hospital release before you go home.  Get CBC, CMP, 2 view Chest X ray checked  by Primary MD during your next visit or SNF MD in 5-7 days ( we routinely change or add medications that can affect your baseline labs and fluid status, therefore we recommend that you get the mentioned basic workup next visit with your PCP, your PCP may decide not to get them or add new tests based on their clinical decision)  On your next visit with your primary care physician please Get Medicines reviewed and adjusted.  If you experience worsening of your admission symptoms, develop shortness of breath, life threatening emergency,  suicidal or homicidal thoughts you must seek medical attention immediately by calling 911 or calling your MD immediately  if symptoms less severe.  You Must read complete instructions/literature along with all the possible adverse reactions/side effects for all the Medicines you take and that have been prescribed to you. Take any new Medicines after you have completely understood and accpet all the possible adverse reactions/side effects.   Do not drive, operate heavy machinery, perform activities at heights, swimming or participation in water activities or provide baby sitting services if your were admitted for syncope or siezures until you have seen by Primary MD or a Neurologist and advised to do so again.  Do not drive when taking Pain medications.   Increase activity slowly   Complete by: As directed    Increase activity slowly   Complete by: As directed      Allergies as of 11/20/2019      Reactions   Methadone    Penicillins    Tolerates cephalosporins   Propoxyphene    Sulfa Antibiotics    Valproic Acid And Related    Thrombocytopenia      Medication List    TAKE these medications   acetaminophen 325 MG tablet Commonly known as: TYLENOL Take 650 mg by mouth every 4 (four) hours as needed  for mild pain or moderate pain.   albuterol 108 (90 Base) MCG/ACT inhaler Commonly known as: VENTOLIN HFA Inhale 1 puff into the lungs every 3 (three) hours as needed for wheezing or shortness of breath.   ammonium lactate 12 % lotion Commonly known as: LAC-HYDRIN Apply 1 application topically 2 (two) times daily. Apply topically to bilateral feet   arformoterol 15 MCG/2ML Nebu Commonly known as: BROVANA Take 2 mLs (15 mcg total) by nebulization 2 (two) times daily.   benztropine 0.5 MG tablet Commonly known as: COGENTIN Take 0.5 mg by mouth 2 (two) times daily.   docusate sodium 100 MG capsule Commonly known as: COLACE Take 100 mg by mouth 2 (two) times daily.   feeding  supplement (PRO-STAT SUGAR FREE 64) Liqd Take 30 mLs by mouth 3 (three) times daily with meals. Sugar free   ferrous sulfate 325 (65 FE) MG tablet Take 1 tablet (325 mg total) by mouth 2 (two) times daily with a meal.   fluPHENAZine 5 MG tablet Commonly known as: PROLIXIN Take 3 tablets (15 mg total) by mouth at bedtime.   fluPHENAZine 5 MG tablet Commonly known as: PROLIXIN Take 3 tablets (15 mg total) by mouth daily.   furosemide 20 MG tablet Commonly known as: Lasix Take 1 tablet (20 mg total) by mouth daily.   guaifenesin 100 MG/5ML syrup Commonly known as: ROBITUSSIN Take 100 mg by mouth every 4 (four) hours as needed for cough.   hydrocortisone 10 MG tablet Commonly known as: CORTEF Take 1 tablet (10 mg total) by mouth 2 (two) times daily.   hydroxychloroquine 200 MG tablet Commonly known as: PLAQUENIL Take 200 mg by mouth 2 (two) times daily.   levothyroxine 200 MCG tablet Commonly known as: SYNTHROID Take 200 mcg by mouth daily before breakfast.   LORazepam 2 MG tablet Commonly known as: ATIVAN Take 1 tablet (2 mg total) by mouth every 6 (six) hours as needed for anxiety.   methocarbamol 750 MG tablet Commonly known as: ROBAXIN Take 750 mg by mouth at bedtime.   metoprolol tartrate 50 MG tablet Commonly known as: LOPRESSOR Take 1 tablet (50 mg total) by mouth 2 (two) times daily.   MIRALAX PO Take 17 g by mouth daily.   multivitamin with minerals tablet Take 1 tablet by mouth daily.   pantoprazole 40 MG tablet Commonly known as: PROTONIX Take 1 tablet (40 mg total) by mouth daily.   Potassium Chloride ER 20 MEQ Tbcr Take 10 mEq by mouth daily.   tamsulosin 0.4 MG Caps capsule Commonly known as: FLOMAX Take 0.4 mg by mouth daily.   traMADol 50 MG tablet Commonly known as: ULTRAM Take 1 tablet (50 mg total) by mouth every 6 (six) hours as needed for moderate pain or severe pain.   vitamin C 500 MG tablet Commonly known as: ASCORBIC ACID Take  500 mg by mouth 2 (two) times daily.       Contact information for follow-up providers    [name], MD. Schedule an appointment as soon as possible for a visit in 1 week(s).   Specialty: Family Medicine Contact information: [place] [address] [phone]        Benjamine Sprague, DO Follow up.   Specialty: Surgery Why: Follow up for [facility] stay and Colon mass Contact information: [place] [address] [phone]        Cammie Sickle, MD. Schedule an appointment as soon as possible for a visit in 1  week(s).   Specialties: Internal Medicine, Oncology Why: Hospital follow up Colon mass.  Contact information: [place] [address] [phone]            Contact information for after-discharge care    Destination    [facility] Preferred SNF .   Service: Skilled Nursing Contact information: [place] [phone]                 Allergies  Allergen Reactions  . Methadone   . Penicillins     Tolerates cephalosporins   . Propoxyphene   . Sulfa Antibiotics   . Valproic Acid And Related     Thrombocytopenia    You were cared for by a hospitalist during your hospital stay. If you have any questions about your discharge medications or the care you received while you were in the hospital after you are discharged, you can call the unit and asked to speak with the hospitalist on call if the hospitalist that took care of you is not available. Once you are discharged, your primary care physician will handle any further medical issues. Please note that no refills for any discharge medications will be authorized once you are discharged, as it is imperative that you return to your primary care physician (or establish a relationship with a primary care physician if you do not have one) for your aftercare needs so that they can reassess your need for  medications and monitor your lab values.   Procedures/Studies: DG Abd 1 View  Result Date: 11/15/2019 CLINICAL DATA:  Constipation.  Anemia. EXAM: ABDOMEN - 1 VIEW COMPARISON:  None FINDINGS: Moderate fecal loading in the colon. Air-filled prominent loops of large and small bowel. IMPRESSION: Probable mild colonic ileus.  Moderate fecal loading in the colon. Electronically Signed   By: Dorise Bullion III M.D   On: 11/15/2019 17:37   CT CHEST W CONTRAST  Result Date: 11/07/2019 CLINICAL DATA:  Staging for colorectal carcinoma. Colon mass seen on colonoscopy on 11/02/2019. EXAM: CT CHEST, ABDOMEN, AND PELVIS WITH CONTRAST TECHNIQUE: Multidetector CT imaging of the chest, abdomen and pelvis was performed following the standard protocol during bolus administration of intravenous contrast. CONTRAST:  126mL OMNIPAQUE IOHEXOL 300 MG/ML  SOLN COMPARISON:  None. FINDINGS: CT CHEST FINDINGS Cardiovascular: Heart is normal size. No pericardial effusion. Three-vessel coronary artery calcifications. Great vessels are normal in caliber. Mild aortic atherosclerosis. Mediastinum/Nodes: No neck base, axillary, mediastinal or hilar masses or enlarged lymph nodes. Trachea and esophagus are unremarkable. Lungs/Pleura: No lung mass or nodule. Bilateral areas of bronchiectasis, irregular interstitial thickening and architectural distortion with honeycombing noted in the posterior inferior lower lobes and, to lesser degree, left upper lobe lingula. Findings consistent with interstitial fibrosis, which was present at the lung bases on the prior CT. No evidence of pneumonia or pulmonary edema. No pleural effusion or pneumothorax. Musculoskeletal: No fracture or acute finding. No osteoblastic or osteolytic lesions. CT ABDOMEN PELVIS FINDINGS Hepatobiliary: Somewhat limited due to artifact from the overlying left arm. Liver normal in size. No mass or focal lesion. Subtle dependent gallstones. No gallbladder wall thickening or  evidence of acute cholecystitis. No bile duct dilation. Pancreas: Unremarkable. No pancreatic ductal dilatation or surrounding inflammatory changes. Spleen: Normal in size without focal abnormality. Adrenals/Urinary Tract: 14 mm left adrenal nodule, unchanged compared to the prior CT. Normal right adrenal gland. Kidneys normal in size, orientation and position with symmetric enhancement and excretion. No masses, stones  or hydronephrosis. Normal ureters. Normal bladder. Stomach/Bowel: Colon is normal in caliber. The colon mass noted on the recent colonoscopy is not defined on CT. No wall thickening or inflammation. Stomach is unremarkable. Small bowel is normal in caliber with no wall thickening or inflammation. No evidence of appendicitis. Appendix not definitively seen. Vascular/Lymphatic: Mild aortic atherosclerosis. No aneurysm. No enlarged lymph nodes. Reproductive: Prostate not enlarged. Other: Small umbilical hernia. No ascites. There is subcutaneous soft tissue edema evident along right lateral thigh to right pelvis. Musculoskeletal: Chronic fracture of the left femoral neck with fragmentation of the femoral head, widening of the acetabula and acetabular protrusio. There are advanced right hip joint arthropathic changes. These findings are chronic. There are no acute fractures. No osteoblastic or osteolytic lesions. IMPRESSION: 1. No evidence of locally invasive colorectal carcinoma or of metastatic disease. 2. The colon mass noted on the recent colonoscopy is not visualized on CT. 3. No acute abnormalities within the chest, abdomen or pelvis. 4. Coronary artery calcifications and mild aortic atherosclerosis. 5. Lungs demonstrate changes of interstitial fibrosis. 6. Small dependent gallstones, but no evidence of acute cholecystitis. 7. Marked arthropathic changes the hips, also noted of the shoulders. Findings consistent with rheumatoid arthritis. Electronically Signed   By: Lajean Manes M.D.   On:  11/07/2019 20:00   CT ABDOMEN PELVIS W CONTRAST  Result Date: 11/07/2019 CLINICAL DATA:  Staging for colorectal carcinoma. Colon mass seen on colonoscopy on 11/02/2019. EXAM: CT CHEST, ABDOMEN, AND PELVIS WITH CONTRAST TECHNIQUE: Multidetector CT imaging of the chest, abdomen and pelvis was performed following the standard protocol during bolus administration of intravenous contrast. CONTRAST:  167mL OMNIPAQUE IOHEXOL 300 MG/ML  SOLN COMPARISON:  None. FINDINGS: CT CHEST FINDINGS Cardiovascular: Heart is normal size. No pericardial effusion. Three-vessel coronary artery calcifications. Great vessels are normal in caliber. Mild aortic atherosclerosis. Mediastinum/Nodes: No neck base, axillary, mediastinal or hilar masses or enlarged lymph nodes. Trachea and esophagus are unremarkable. Lungs/Pleura: No lung mass or nodule. Bilateral areas of bronchiectasis, irregular interstitial thickening and architectural distortion with honeycombing noted in the posterior inferior lower lobes and, to lesser degree, left upper lobe lingula. Findings consistent with interstitial fibrosis, which was present at the lung bases on the prior CT. No evidence of pneumonia or pulmonary edema. No pleural effusion or pneumothorax. Musculoskeletal: No fracture or acute finding. No osteoblastic or osteolytic lesions. CT ABDOMEN PELVIS FINDINGS Hepatobiliary: Somewhat limited due to artifact from the overlying left arm. Liver normal in size. No mass or focal lesion. Subtle dependent gallstones. No gallbladder wall thickening or evidence of acute cholecystitis. No bile duct dilation. Pancreas: Unremarkable. No pancreatic ductal dilatation or surrounding inflammatory changes. Spleen: Normal in size without focal abnormality. Adrenals/Urinary Tract: 14 mm left adrenal nodule, unchanged compared to the prior CT. Normal right adrenal gland. Kidneys normal in size, orientation and position with symmetric enhancement and excretion. No masses,  stones or hydronephrosis. Normal ureters. Normal bladder. Stomach/Bowel: Colon is normal in caliber. The colon mass noted on the recent colonoscopy is not defined on CT. No wall thickening or inflammation. Stomach is unremarkable. Small bowel is normal in caliber with no wall thickening or inflammation. No evidence of appendicitis. Appendix not definitively seen. Vascular/Lymphatic: Mild aortic atherosclerosis. No aneurysm. No enlarged lymph nodes. Reproductive: Prostate not enlarged. Other: Small umbilical hernia. No ascites. There is subcutaneous soft tissue edema evident along right lateral thigh to right pelvis. Musculoskeletal: Chronic fracture of the left femoral neck with fragmentation of the femoral head, widening of the acetabula  and acetabular protrusio. There are advanced right hip joint arthropathic changes. These findings are chronic. There are no acute fractures. No osteoblastic or osteolytic lesions. IMPRESSION: 1. No evidence of locally invasive colorectal carcinoma or of metastatic disease. 2. The colon mass noted on the recent colonoscopy is not visualized on CT. 3. No acute abnormalities within the chest, abdomen or pelvis. 4. Coronary artery calcifications and mild aortic atherosclerosis. 5. Lungs demonstrate changes of interstitial fibrosis. 6. Small dependent gallstones, but no evidence of acute cholecystitis. 7. Marked arthropathic changes the hips, also noted of the shoulders. Findings consistent with rheumatoid arthritis. Electronically Signed   By: Lajean Manes M.D.   On: 11/07/2019 20:00   DG Chest Port 1 View  Result Date: 11/12/2019 CLINICAL DATA:  Pneumonia. EXAM: PORTABLE CHEST 1 VIEW COMPARISON:  November 10, 2019 FINDINGS: Bilateral pulmonary infiltrates, most focal in the bases, have significantly worsened in the interval. The infiltrate is somewhat rounded in the right base. The cardiomediastinal silhouette is stable. No pneumothorax. No other acute abnormalities. IMPRESSION:  1. Bibasilar infiltrates have significantly worsened in the interval. There is a rounded somewhat masslike component to the medial right basilar infiltrate. Recommend attention on follow-up. Alternatively, CT imaging could better evaluate. Electronically Signed   By: Dorise Bullion III M.D   On: 11/12/2019 13:28   DG Chest Port 1 View  Result Date: 11/10/2019 CLINICAL DATA:  Increased short of breath EXAM: PORTABLE CHEST 1 VIEW COMPARISON:  09/11/2019 FINDINGS: Improvement in diffuse bilateral airspace disease. Probable baseline scarring in the bases similar to 12/27/2017. No pleural effusion. Right upper lobe airspace disease is more prominent compared with 2019 and could represent pneumonia. IMPRESSION: Prominent lung markings at the bases appear chronic. Possible right upper pneumonia. Electronically Signed   By: Franchot Gallo M.D.   On: 11/10/2019 15:25   ECHOCARDIOGRAM COMPLETE  Result Date: 11/13/2019   ECHOCARDIOGRAM REPORT   Patient Name:   Jerry [name] Date of Exam: 11/13/2019 Medical Rec #:  [id number]   Height:       74.0 in Accession #:    [id number]  Weight:       346.4 lb Date of Birth:  01/21/1956   BSA:          2.74 m Patient Age:    78 years    BP:           124/70 mmHg Patient Gender: M           HR:           101 bpm. Exam Location:  [facility] Procedure: 2D Echo, Cardiac Doppler and Color Doppler Indications:     CHF-acute diastolic [id number]  History:         Patient has prior history of Echocardiogram examinations, most                  recent 06/26/2017. CHF, COPD; Risk Factors:Hypertension.  Sonographer:     Sherrie Sport RDCS (AE) Referring Phys:  [id number] Sidney Ace Diagnosing Phys: Ida Rogue MD  Sonographer Comments: Technically challenging study due to limited acoustic windows, no apical window and no subcostal window. IMPRESSIONS  1. Left ventricular ejection fraction, by visual estimation, is 60 to 65%. The left ventricle has normal function. There is moderately increased left  ventricular hypertrophy.  2. The left ventricle has no regional wall motion abnormalities.  3. Global right ventricle has normal systolic function.The right ventricular size is normal. No increase in right ventricular wall thickness.  4.  Left atrial size was mildly dilated.  5. TR signal is inadequate for assessing pulmonary artery systolic pressure. FINDINGS  Left Ventricle: Left ventricular ejection fraction, by visual estimation, is 60 to 65%. The left ventricle has normal function. The left ventricle has no regional wall motion abnormalities. There is moderately increased left ventricular hypertrophy. Normal left atrial pressure. Right Ventricle: The right ventricular size is normal. No increase in right ventricular wall thickness. Global RV systolic function is has normal systolic function. Left Atrium: Left atrial size was mildly dilated. Right Atrium: Right atrial size was normal in size Pericardium: There is no evidence of pericardial effusion. Mitral Valve: The mitral valve is normal in structure. No evidence of mitral valve regurgitation. No evidence of mitral valve stenosis by observation. Tricuspid Valve: The tricuspid valve is normal in structure. Tricuspid valve regurgitation is not demonstrated. Aortic Valve: The aortic valve was not well visualized. Aortic valve regurgitation is not visualized. The aortic valve is structurally normal, with no evidence of sclerosis or stenosis. Pulmonic Valve: The pulmonic valve was normal in structure. Pulmonic valve regurgitation is not visualized. Pulmonic regurgitation is not visualized. Aorta: The aortic root, ascending aorta and aortic arch are all structurally normal, with no evidence of dilitation or obstruction. Venous: The inferior vena cava is normal in size with greater than 50% respiratory variability, suggesting right atrial pressure of 3 mmHg. IAS/Shunts: No atrial level shunt detected by color flow Doppler. There is no evidence of a patent foramen  ovale. No ventricular septal defect is seen or detected. There is no evidence of an atrial septal defect.  LEFT VENTRICLE PLAX 2D LVIDd:         5.25 cm LVIDs:         2.98 cm LV PW:         1.33 cm LV IVS:        1.88 cm LVOT diam:     2.20 cm LV SV:         98 ml LV SV Index:   33.38 LVOT Area:     3.80 cm  LEFT ATRIUM         Index LA diam:    4.40 cm 1.60 cm/m                        PULMONIC VALVE AORTA                 PV Vmax:        0.92 m/s Ao Root diam: 3.10 cm PV Peak grad:   3.4 mmHg                       RVOT Peak grad: 4 mmHg   SHUNTS Systemic Diam: 2.20 cm  Ida Rogue MD Electronically signed by Ida Rogue MD Signature Date/Time: 11/13/2019/2:31:10 PM    Final       The results of significant diagnostics from this hospitalization (including imaging, microbiology, ancillary and laboratory) are listed below for reference.     Microbiology: Recent Results (from the past 240 hour(s))  SARS CORONAVIRUS 2 (TAT 6-24 HRS) Nasopharyngeal Nasopharyngeal Swab     Status: None   Collection Time: 11/19/19  1:38 PM   Specimen: Nasopharyngeal Swab  Result Value Ref Range Status   SARS Coronavirus 2 NEGATIVE NEGATIVE Final    Comment: (NOTE) SARS-CoV-2 target nucleic acids are NOT DETECTED. The SARS-CoV-2 RNA is generally detectable in upper and lower respiratory specimens during  the acute phase of infection. Negative results do not preclude SARS-CoV-2 infection, do not rule out co-infections with other pathogens, and should not be used as the sole basis for treatment or other patient management decisions. Negative results must be combined with clinical observations, patient history, and epidemiological information. The expected result is Negative. Fact Sheet for Patients: SugarRoll.be Fact Sheet for Healthcare Providers: https://www.woods-mathews.com/ This test is not yet approved or cleared by the [place] FDA and  has been  authorized for detection and/or diagnosis of SARS-CoV-2 by FDA under an Emergency Use Authorization (EUA). This EUA will remain  in effect (meaning this test can be used) for the duration of the COVID-19 declaration under Section 56 4(b)(1) of the Act, 21 U.S.C. section 360bbb-3(b)(1), unless the authorization is terminated or revoked sooner. Performed at [facility], [place] [address], [place], [place] [address]      Labs: BNP (last 3 results) Recent Labs    09/12/19 0000 09/13/19 0141 09/14/19 0123  BNP 650.2* 502.3* [id number]*   Basic Metabolic Panel: Recent Labs  Lab 11/14/19 0434 11/15/19 0459 11/16/19 0631 11/17/19 0530 11/18/19 0730 11/19/19 0524 11/20/19 0621  NA 136 138  --  140 138 138 139  K 4.2 3.7 3.5 3.6 3.8 4.1 3.9  CL 98 98  --  100 98 98 101  CO2 28 32  --  27 30 32 31  GLUCOSE 114* 108*  --  107* 102* 94 110*  BUN 16 14  --  12 11 9 9   CREATININE 0.58* 0.50*  --  0.45* 0.40* 0.40* 0.42*  CALCIUM 8.5* 8.3*  --  8.4* 8.2* 8.5* 8.3*  MG 2.3  --  2.2 2.3  --   --   --    Liver Function Tests: No results for input(s): AST, ALT, ALKPHOS, BILITOT, PROT, ALBUMIN in the last 168 hours. No results for input(s): LIPASE, AMYLASE in the last 168 hours. No results for input(s): AMMONIA in the last 168 hours. CBC: Recent Labs  Lab 11/13/19 0923 11/18/19 0730 11/19/19 0524 11/20/19 0621  WBC 9.2 6.6 6.8 6.6  NEUTROABS 6.7 4.2 4.3 4.3  HGB 8.4* 8.7* 8.8* 8.3*  HCT 28.0* 28.4* 29.0* 28.3*  MCV 79.3* 77.0* 76.7* 80.2  PLT 285 284 309 271   Cardiac Enzymes: No results for input(s): CKTOTAL, CKMB, CKMBINDEX, TROPONINI in the last 168 hours. BNP: Invalid input(s): POCBNP CBG: Recent Labs  Lab 11/19/19 0111  GLUCAP 85   D-Dimer No results for input(s): DDIMER in the last 72 hours. Hgb A1c No results for input(s): HGBA1C in the last 72 hours. Lipid Profile No results for input(s): CHOL, HDL, LDLCALC, TRIG, CHOLHDL, LDLDIRECT in the last 72  hours. Thyroid function studies No results for input(s): TSH, T4TOTAL, T3FREE, THYROIDAB in the last 72 hours.  Invalid input(s): FREET3 Anemia work up No results for input(s): VITAMINB12, FOLATE, FERRITIN, TIBC, IRON, RETICCTPCT in the last 72 hours. Urinalysis    Component Value Date/Time   COLORURINE YELLOW (A) 12/25/2017 1601   APPEARANCEUR HAZY (A) 12/25/2017 1601   APPEARANCEUR Hazy 01/19/2013 1635   LABSPEC 1.018 12/25/2017 1601   LABSPEC 1.008 01/19/2013 1635   PHURINE 5.0 12/25/2017 1601   GLUCOSEU NEGATIVE 12/25/2017 1601   GLUCOSEU Negative 01/19/2013 1635   HGBUR NEGATIVE 12/25/2017 1601   BILIRUBINUR NEGATIVE 12/25/2017 1601   BILIRUBINUR Negative 01/19/2013 1635   KETONESUR NEGATIVE 12/25/2017 1601   PROTEINUR NEGATIVE 12/25/2017 1601   NITRITE POSITIVE (A) 12/25/2017 1601  LEUKOCYTESUR LARGE (A) 12/25/2017 1601   LEUKOCYTESUR Negative 01/19/2013 1635   Sepsis Labs Invalid input(s): PROCALCITONIN,  WBC,  LACTICIDVEN Microbiology Recent Results (from the past 240 hour(s))  SARS CORONAVIRUS 2 (TAT 6-24 HRS) Nasopharyngeal Nasopharyngeal Swab     Status: None   Collection Time: 11/19/19  1:38 PM   Specimen: Nasopharyngeal Swab  Result Value Ref Range Status   SARS Coronavirus 2 NEGATIVE NEGATIVE Final    Comment: (NOTE) SARS-CoV-2 target nucleic acids are NOT DETECTED. The SARS-CoV-2 RNA is generally detectable in upper and lower respiratory specimens during the acute phase of infection. Negative results do not preclude SARS-CoV-2 infection, do not rule out co-infections with other pathogens, and should not be used as the sole basis for treatment or other patient management decisions. Negative results must be combined with clinical observations, patient history, and epidemiological information. The expected result is Negative. Fact Sheet for Patients: SugarRoll.be Fact Sheet for Healthcare  Providers: https://www.woods-mathews.com/ This test is not yet approved or cleared by the [place] FDA and  has been authorized for detection and/or diagnosis of SARS-CoV-2 by FDA under an Emergency Use Authorization (EUA). This EUA will remain  in effect (meaning this test can be used) for the duration of the COVID-19 declaration under Section 56 4(b)(1) of the Act, 21 U.S.C. section 360bbb-3(b)(1), unless the authorization is terminated or revoked sooner. Performed at [facility], [place] [address] [place], [place] [address]      Time coordinating discharge:  I have spent 35 minutes face to face with the patient and on the ward discussing the patients care, assessment, plan and disposition with other care givers. >50% of the time was devoted counseling the patient about the risks and benefits of treatment/Discharge disposition and coordinating care.   SIGNED:   Damita Lack, MD  [facility] 11/20/2019, 8:27 AM   If 7PM-7AM, please contact night-coverage

## 2019-11-20 NOTE — TOC Transition Note (Signed)
Transition of Care [facility]) - CM/SW Discharge Note   Patient Details  Name: Jerry [name] MRN: [id number] Date of Birth: 26-Nov-1956  Transition of Care [facility]) CM/SW Contact:  Beverly Sessions, RN Phone Number: 11/20/2019, 10:57 AM   Clinical Narrative:    [facility] at [facility] notified, and discharge clinical sent in the [place]  Patient followed by outpatient palliative.  Santiago Glad with [employer] notified of discharge.   Called legal guardian.  Unable to leave message, SMS message sent to return call  EMS packet and signed DNR on chart.  Bedside RN notified     Final next level of care: Long Term Nursing Home Barriers to Discharge: No Barriers Identified   Patient Goals and CMS Choice        Discharge Placement              Patient chooses bed at: [facility] [place] Patient to be transferred to facility by: EMS Name of family member notified: Message left for gaurdian Patient and family notified of of transfer: 11/20/19  Discharge Plan and Services In-house Referral: Clinical Social Work                                   Social Determinants of Health (SDOH) Interventions     Readmission Risk Interventions Readmission Risk Prevention Plan 11/20/2019 11/03/2019  Transportation Screening Complete Complete  HRI or [place] - Not Complete  HRI or Home Care Consult comments - Pt lives at a [facility] for [place] Planning/Counseling - [place] - Not Applicable  Medication Review [employer]) Complete Complete  PCP or Specialist appointment within 3-5 days of discharge (No Data) -  Palliative Care Screening Complete -  Skilled Nursing Facility Complete -  Some recent data might be hidden

## 2019-11-20 NOTE — Progress Notes (Signed)
Jerry [name] to be D/C'd [facility] per MD order.  Discussed prescriptions and follow up appointments with the patient. Prescriptions given to patient, medication list explained in detail. Pt verbalized understanding.  Allergies as of 11/20/2019      Reactions   Methadone    Penicillins    Tolerates cephalosporins   Propoxyphene    Sulfa Antibiotics    Valproic Acid And Related    Thrombocytopenia      Medication List    TAKE these medications   acetaminophen 325 MG tablet Commonly known as: TYLENOL Take 650 mg by mouth every 4 (four) hours as needed for mild pain or moderate pain.   albuterol 108 (90 Base) MCG/ACT inhaler Commonly known as: VENTOLIN HFA Inhale 1 puff into the lungs every 3 (three) hours as needed for wheezing or shortness of breath.   ammonium lactate 12 % lotion Commonly known as: LAC-HYDRIN Apply 1 application topically 2 (two) times daily. Apply topically to bilateral feet   arformoterol 15 MCG/2ML Nebu Commonly known as: BROVANA Take 2 mLs (15 mcg total) by nebulization 2 (two) times daily.   benztropine 0.5 MG tablet Commonly known as: COGENTIN Take 0.5 mg by mouth 2 (two) times daily.   docusate sodium 100 MG capsule Commonly known as: COLACE Take 100 mg by mouth 2 (two) times daily.   feeding supplement (PRO-STAT SUGAR FREE 64) Liqd Take 30 mLs by mouth 3 (three) times daily with meals. Sugar free   ferrous sulfate 325 (65 FE) MG tablet Take 1 tablet (325 mg total) by mouth 2 (two) times daily with a meal.   fluPHENAZine 5 MG tablet Commonly known as: PROLIXIN Take 3 tablets (15 mg total) by mouth at bedtime.   fluPHENAZine 5 MG tablet Commonly known as: PROLIXIN Take 3 tablets (15 mg total) by mouth daily.   furosemide 20 MG tablet Commonly known as: Lasix Take 1 tablet (20 mg total) by mouth daily.   guaifenesin 100 MG/5ML syrup Commonly known as: ROBITUSSIN Take 100 mg by mouth every 4 (four) hours as needed for cough.    hydrocortisone 10 MG tablet Commonly known as: CORTEF Take 1 tablet (10 mg total) by mouth 2 (two) times daily.   hydroxychloroquine 200 MG tablet Commonly known as: PLAQUENIL Take 200 mg by mouth 2 (two) times daily.   levothyroxine 200 MCG tablet Commonly known as: SYNTHROID Take 200 mcg by mouth daily before breakfast.   LORazepam 2 MG tablet Commonly known as: ATIVAN Take 1 tablet (2 mg total) by mouth every 6 (six) hours as needed for anxiety.   methocarbamol 750 MG tablet Commonly known as: ROBAXIN Take 750 mg by mouth at bedtime.   metoprolol tartrate 50 MG tablet Commonly known as: LOPRESSOR Take 1 tablet (50 mg total) by mouth 2 (two) times daily.   MIRALAX PO Take 17 g by mouth daily.   multivitamin with minerals tablet Take 1 tablet by mouth daily.   pantoprazole 40 MG tablet Commonly known as: PROTONIX Take 1 tablet (40 mg total) by mouth daily.   Potassium Chloride ER 20 MEQ Tbcr Take 10 mEq by mouth daily.   tamsulosin 0.4 MG Caps capsule Commonly known as: FLOMAX Take 0.4 mg by mouth daily.   traMADol 50 MG tablet Commonly known as: ULTRAM Take 1 tablet (50 mg total) by mouth every 6 (six) hours as needed for moderate pain or severe pain.   vitamin C 500 MG tablet Commonly known as: ASCORBIC ACID Take 500 mg  by mouth 2 (two) times daily.       Vitals:   11/20/19 0228 11/20/19 0529  BP:  (!) 149/88  Pulse:  (!) 102  Resp:  18  Temp:  98.8 F (37.1 C)  SpO2: 100% 100%    Skin clean, dry and intact without evidence of skin break down, no evidence of skin tears noted. IV catheter discontinued intact. Site without signs and symptoms of complications. Dressing and pressure applied. Pt denies pain at this time. No complaints noted.  An After Visit Summary was printed and given to the patient. Patient escorted via stretcher with non-emergent EMS to [facility].  Chuck Hint RN [facility] [employer]

## 2019-11-20 NOTE — Consult Note (Signed)
PHARMACY CONSULT NOTE  Pharmacy Consult for Electrolyte Monitoring and Replacement   Recent Labs: Potassium (mmol/L)  Date Value  11/20/2019 3.9  01/05/2013 3.8   Magnesium (mg/dL)  Date Value  11/17/2019 2.3   Calcium (mg/dL)  Date Value  11/20/2019 8.3 (L)   Calcium, Total (mg/dL)  Date Value  01/05/2013 8.5   Albumin (g/dL)  Date Value  11/07/2019 2.3 (L)  01/05/2013 2.9 (L)   Phosphorus (mg/dL)  Date Value  11/07/2019 3.9   Sodium (mmol/L)  Date Value  11/20/2019 139  01/05/2013 140   Corrected Ca: 9.9 mg/dL  Assessment: 63 y.o.malewith a known history of morbid obesity, severe rheumatoid arthritis on plaquenilwith multiple contractures, CHF diastolic, [name], chronic anemia, chronic thrombocytopenia, chronic pain due to rheumatoid arthritis admitted with anemia. Furosemide 40 mg IV TID started 12/18 and stopped 12/22  Goal of Therapy Given Cardiac History:  Potassium 4.0 - 5.1 mmol/L Magnesium 2.0 - 2.4 mg/dL All Other Electrolytes WNL  Plan:   No electrolyte replacement warranted today  BMP in 2 days on 12/26  Chinita Greenland PharmD Clinical Pharmacist 11/20/2019

## 2019-11-26 ENCOUNTER — Other Ambulatory Visit: Payer: Self-pay

## 2019-11-26 ENCOUNTER — Non-Acute Institutional Stay: Payer: Medicaid Other | Admitting: Primary Care

## 2019-11-26 DIAGNOSIS — Z515 Encounter for palliative care: Secondary | ICD-10-CM

## 2019-11-26 NOTE — Progress Notes (Signed)
[facility] Palliative Care Consult Note Telephone: [phone]  Fax: [phone]  TELEHEALTH VISIT STATEMENT Due to the COVID-19 crisis, this visit was done via telemedicine from my office. It was initiated and consented to by this patient and/or family.  PATIENT NAME: [name] [address] [place] [address] [phone] (home)  DOB: [birth year] MRN: [id number]  PRIMARY CARE PROVIDER:   [name], MD, [address] [place] [address] [phone]  REFERRING PROVIDER:  [name], MD [address] [place],  [place] [address] [phone]  RESPONSIBLE PARTY:   Extended Emergency Contact Information Primary Emergency Contact: Manual Meier of [place] Phone: [phone] Relation: Legal Guardian Secondary Emergency Contact: Tomasa Rand [place] of [place] Phone: [phone] Relation: Sister   ASSESSMENT AND RECOMMENDATIONS:   1. Advance Care Planning/Goals of Care: Goals include to maximize quality of life and symptom management. New dx of colon cancer, will be seen in oncology in mid Jan. SNF staff states niece Scarlette Ar is aware of appts.  We will discuss goals of care once they have more information.  2. Symptom Management:   Pain: Not complaining of pain.  Dyspnea: Has oxygen in place, talking during our interview.  Appears not to be in distress.  Appetite: Eating 50-100 %, weight is 304 lbs now. Mechanical soft, and thin liquids.  Disease process: Has been diagnosed with cancer, which is a new dx. I will speak with Niece about goals of care after they speak with oncology in a few weeks.  3. Family /Caregiver/Community Supports: Niece Scarlette Ar is POA. Lives in [facility] as long term [facility].  4. Cognitive / Functional decline: Alert, oriented x 1. 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 Needs full assistance with adls due to RA hand deformities.   5. Follow up Palliative Care Visit: Palliative care  will continue to follow for goals of care clarification and symptom management. Return 4 weeks or prn.  I spent 25 minutes providing this consultation,  from 1645 to 1710. More than 50% of the time in this consultation was spent coordinating communication.   HISTORY OF PRESENT ILLNESS:  Jerry [name] is a 63 y.o. year old male with multiple medical Colonic mass, morbid obesity, severe rheumatoid arthritis, diastolic CHF, [name], chronic anemia, thrombocytopenia. Palliative Care was asked to follow this patient by consultation request of [name], MD to help address advance care planning and goals of care. This is a follow up visit.  CODE STATUS: TBD  PPS: 30% HOSPICE ELIGIBILITY/DIAGNOSIS: yes/colon cancer  PAST MEDICAL HISTORY:  Past Medical History:  Diagnosis Date  . BPH (benign prostatic hyperplasia)   . CHF (congestive heart failure) ([place])   . Chronic pain   . COPD (chronic obstructive pulmonary disease) ([place])   . Hypertension   . Morbid obesity ([place])   . Pressure ulcer   . Rheumatoid arthritis ([place])   . Schizophrenia ([place])   . Thyroid disease     SOCIAL HX:  Social History   Tobacco Use  . Smoking status: Never Smoker  . Smokeless tobacco: Never Used  Substance Use Topics  . Alcohol use: No    ALLERGIES:  Allergies  Allergen Reactions  . Methadone   . Penicillins     Tolerates cephalosporins   . Propoxyphene   . Sulfa Antibiotics   . Valproic Acid And Related     Thrombocytopenia     PERTINENT MEDICATIONS:  Outpatient Encounter Medications as of 11/26/2019  Medication Sig  . acetaminophen (TYLENOL) 325 MG tablet Take 650 mg by mouth every 4 (four) hours as needed for mild pain or moderate pain.  Marland Kitchen albuterol (PROVENTIL HFA;VENTOLIN HFA) 108 (90 Base) MCG/ACT inhaler Inhale 1 puff into the lungs every 3 (three) hours as needed for wheezing or shortness of breath.   . Amino Acids-Protein Hydrolys (FEEDING SUPPLEMENT, PRO-STAT SUGAR FREE 64,)  LIQD Take 30 mLs by mouth 3 (three) times daily with meals. Sugar free  . ammonium lactate (LAC-HYDRIN) 12 % lotion Apply 1 application topically 2 (two) times daily. Apply topically to bilateral feet  . arformoterol (BROVANA) 15 MCG/2ML NEBU Take 2 mLs (15 mcg total) by nebulization 2 (two) times daily.  . benztropine (COGENTIN) 0.5 MG tablet Take 0.5 mg by mouth 2 (two) times daily.  Marland Kitchen docusate sodium (COLACE) 100 MG capsule Take 100 mg by mouth 2 (two) times daily.  . ferrous sulfate 325 (65 FE) MG tablet Take 1 tablet (325 mg total) by mouth 2 (two) times daily with a meal.  . fluPHENAZine (PROLIXIN) 5 MG tablet Take 3 tablets (15 mg total) by mouth daily.  . fluPHENAZine (PROLIXIN) 5 MG tablet Take 3 tablets (15 mg total) by mouth at bedtime.  . furosemide (LASIX) 20 MG tablet Take 1 tablet (20 mg total) by mouth daily.  Marland Kitchen guaifenesin (ROBITUSSIN) 100 MG/5ML syrup Take 100 mg by mouth every 4 (four) hours as needed for cough.  . hydrocortisone (CORTEF) 10 MG tablet Take 1 tablet (10 mg total) by mouth 2 (two) times daily.  . hydroxychloroquine (PLAQUENIL) 200 MG tablet Take 200 mg by mouth 2 (two) times daily.  Marland Kitchen levothyroxine (SYNTHROID) 200 MCG tablet Take 200 mcg by mouth daily before breakfast.   . LORazepam (ATIVAN) 2 MG tablet Take 1 tablet (2 mg total) by mouth every 6 (six) hours as needed for anxiety.  . methocarbamol (ROBAXIN) 750 MG tablet Take 750 mg by mouth at bedtime.  . metoprolol tartrate (LOPRESSOR) 50 MG tablet Take 1 tablet (50 mg total) by mouth 2 (two) times daily.  . Multiple Vitamins-Minerals (MULTIVITAMIN WITH MINERALS) tablet Take 1 tablet by mouth daily.  . pantoprazole (PROTONIX) 40 MG tablet Take 1 tablet (40 mg total) by mouth daily.  . Polyethylene Glycol 3350 (MIRALAX PO) Take 17 g by mouth daily.  . potassium chloride 20 MEQ TBCR Take 10 mEq by mouth daily.  . tamsulosin (FLOMAX) 0.4 MG CAPS capsule Take 0.4 mg by mouth daily.  . traMADol (ULTRAM) 50 MG  tablet Take 1 tablet (50 mg total) by mouth every 6 (six) hours as needed for moderate pain or severe pain.  . vitamin C (ASCORBIC ACID) 500 MG tablet Take 500 mg by mouth 2 (two) times daily.    No facility-administered encounter medications on file as of 11/26/2019.    PHYSICAL EXAM / ROS:   Current and past weights: 311 lbs from cone chart. SNF = 304.1 lbs General: NAD, frail appearing, obese Cardiovascular: no chest pain reported, no edema Pulmonary: no cough, no increased SOB Abdomen: appetite 75%, endorses constipation, incontinent of bowel, fed due to his hands and RA GU: denies dysuria, incontinent of urine MSK:  RA hand  joint deformities, non ambulatory, bed bound. Skin: no rashes or wounds reported Neurological: Weakness,  h/o mental illness, increasingly forgetful  Jason Coop, NP

## 2019-12-02 ENCOUNTER — Ambulatory Visit: Payer: Self-pay | Admitting: Surgery

## 2019-12-02 NOTE — H&P (View-Only) (Signed)
Subjective:  CC: Chronic anemia, ascending colon mass  HPI:  Jerry [name] is a 64 y.o. male who was consulted by Posey Pronto for evaluation of above.  History of schizophrenia, unable to appropriately answer questions. Per consultation request report, patient is known to be chronically anemic with a recent episode of GI bleeding. Work-up in the hospital included a colonoscopy and EGD. EGD negative, but colonoscopy noted a ulcerated ascending colon mass. Pathology currently pending. Due to the concern, of persistent bleeding from this area, surgery was consulted for possible excision. Surgery also consulted for possible port placement due to difficult IV access, and the patient's need for frequent IV medications.  Past Medical History: has a past medical history of BPH (benign prostatic hyperplasia), CHF (congestive heart failure) ([place]), Chronic pain, COPD (chronic obstructive pulmonary disease) ([place]), Hypertension, Morbid obesity ([place]), Pressure ulcer, Rheumatoid arthritis ([place]), Schizophrenia ([place]), and Thyroid disease.  Past Surgical History: has a past surgical history that includes Esophagogastroduodenoscopy (egd) with propofol (N/A, 10/31/2019) and Colonoscopy with propofol (N/A, 11/02/2019).  Family History: Family history is unknown by patient.  Social History: reports that he has never smoked. He has never used smokeless tobacco. He reports that he does not drink alcohol or use drugs.  Current Medications:        Medications Prior to Admission  Medication Sig Dispense Refill  . acetaminophen (TYLENOL) 325 MG tablet Take 650 mg by mouth every 4 (four) hours as needed for mild pain or moderate pain.    Marland Kitchen albuterol (PROVENTIL HFA;VENTOLIN HFA) 108 (90 Base) MCG/ACT inhaler Inhale 1 puff into the lungs every 3 (three) hours as needed for wheezing or shortness of breath.     . Amino Acids-Protein Hydrolys (FEEDING SUPPLEMENT, PRO-STAT SUGAR FREE 64,) LIQD Take 30 mLs by mouth 3 (three) times daily with  meals. Sugar free    . ammonium lactate (LAC-HYDRIN) 12 % lotion Apply 1 application topically 2 (two) times daily. Apply topically to bilateral feet    . benztropine (COGENTIN) 0.5 MG tablet Take 0.5 mg by mouth 2 (two) times daily.    Marland Kitchen docusate sodium (COLACE) 100 MG capsule Take 100 mg by mouth 2 (two) times daily.    . fluPHENAZine (PROLIXIN) 5 MG tablet Take 3 tablets (15 mg total) by mouth daily. 30 tablet 0  . fluPHENAZine (PROLIXIN) 5 MG tablet Take 3 tablets (15 mg total) by mouth at bedtime. 30 tablet 0  . guaifenesin (ROBITUSSIN) 100 MG/5ML syrup Take 100 mg by mouth every 4 (four) hours as needed for cough.    . hydrocortisone (CORTEF) 10 MG tablet Take 1 tablet (10 mg total) by mouth 2 (two) times daily. 20 tablet 0  . hydroxychloroquine (PLAQUENIL) 200 MG tablet Take 200 mg by mouth 2 (two) times daily.    Marland Kitchen levothyroxine (SYNTHROID) 200 MCG tablet Take 200 mcg by mouth daily before breakfast.     . LORazepam (ATIVAN) 2 MG tablet Take 2 mg by mouth every 6 (six) hours as needed for anxiety.    . methocarbamol (ROBAXIN) 750 MG tablet Take 750 mg by mouth at bedtime.    . metoprolol tartrate (LOPRESSOR) 50 MG tablet Take 1 tablet (50 mg total) by mouth 2 (two) times daily.    . Multiple Vitamins-Minerals (MULTIVITAMIN WITH MINERALS) tablet Take 1 tablet by mouth daily.    . Polyethylene Glycol 3350 (MIRALAX PO) Take 17 g by mouth daily.    . tamsulosin (FLOMAX) 0.4 MG CAPS capsule Take 0.4 mg by mouth daily.    Marland Kitchen  traMADol (ULTRAM) 50 MG tablet Take 50 mg by mouth every 6 (six) hours as needed for moderate pain or severe pain.    . vitamin C (ASCORBIC ACID) 500 MG tablet Take 500 mg by mouth 2 (two) times daily.      Allergies:       Allergies  Allergen Reactions  . Methadone   . Penicillins     Tolerates cephalosporins   . Propoxyphene   . Sulfa Antibiotics   . Valproic Acid And Related     Thrombocytopenia   ROS:  Unable to obtain secondary to patient's baseline  mentation secondary to schizophrenia.  Objective:   BP (!) 156/78 (BP Location: Left Arm)  Pulse 75  Temp 98.6 F (37 C)  Resp 20  Ht 6\' 2"  (1.88 m)  Wt (!) 157.1 kg  SpO2 100%  BMI 44.48 kg/m  Constitutional :  alert, cooperative, appears stated age and no distress  Lymphatics/Throat:  supple without significant adenopathy  Respiratory:  clear to auscultation bilaterally  Cardiovascular:  regular rate and rhythm  Gastrointestinal: soft, non-tender; bowel sounds normal; no masses, no organomegaly.  Musculoskeletal: Steady movement  Skin: Cool and moist  Psychiatric: Normal affect, non-agitated,     LABS:  CMP Latest Ref Rng & Units 10/30/2019 09/14/2019 09/13/2019  Glucose 70 - 99 mg/dL 96 107(H) 99  BUN 8 - 23 mg/dL 12 23 24(H)  Creatinine 0.61 - 1.24 mg/dL 0.49(L) 0.49(L) 0.47(L)  Sodium 135 - 145 mmol/L 139 140 138  Potassium 3.5 - 5.1 mmol/L 3.8 4.0 3.6  Chloride 98 - 111 mmol/L 103 105 104  CO2 22 - 32 mmol/L 29 26 26   Calcium 8.9 - 10.3 mg/dL 8.6(L) 8.2(L) 8.0(L)  Total Protein 6.5 - 8.1 g/dL - 6.6 6.6  Total Bilirubin 0.3 - 1.2 mg/dL - 0.6 0.4  Alkaline Phos 38 - 126 U/L - 77 81  AST 15 - 41 U/L - 22 24  ALT 0 - 44 U/L - 17 16   CBC Latest Ref Rng & Units 11/02/2019 11/01/2019 10/31/2019  WBC 4.0 - 10.5 K/uL 4.3 - 5.5  Hemoglobin 13.0 - 17.0 g/dL 7.6(L) 7.5(L) 7.8(L)  Hematocrit 39.0 - 52.0 % 25.1(L) 25.3(L) 26.6(L)  Platelets 150 - 400 K/uL 255 - 249   RADS:  n/a  Assessment:   Chronic anemia, ulcerated ascending colon mass noted on recent colonoscopy. Consensus is this is likely the source of his chronic anemia and GI bleed. Surgery consulted for possible resection, but not good candidate.  Also asked to place port for IV access. Plan:   I also asked her about placing a port for future IV access. I again mentioned to her that he is at a high risk of complications secondary to his body habitus for this procedure, but POA was agreeable to proceed.  Risks include  bleeding, infection, injury to surrounding organs, port failure, and additional procedures to address said risks.  Benefits include permanent IV access solution.  Alternatives include continued peripheral IV access as needed.

## 2019-12-02 NOTE — H&P (Signed)
Subjective:  CC: Chronic anemia, ascending colon mass  HPI:  Jerry [name] is a 64 y.o. male who was consulted by Posey Pronto for evaluation of above.  History of schizophrenia, unable to appropriately answer questions. Per consultation request report, patient is known to be chronically anemic with a recent episode of GI bleeding. Work-up in the hospital included a colonoscopy and EGD. EGD negative, but colonoscopy noted a ulcerated ascending colon mass. Pathology currently pending. Due to the concern, of persistent bleeding from this area, surgery was consulted for possible excision. Surgery also consulted for possible port placement due to difficult IV access, and the patient's need for frequent IV medications.  Past Medical History: has a past medical history of BPH (benign prostatic hyperplasia), CHF (congestive heart failure) ([place]), Chronic pain, COPD (chronic obstructive pulmonary disease) ([place]), Hypertension, Morbid obesity ([place]), Pressure ulcer, Rheumatoid arthritis ([place]), Schizophrenia ([place]), and Thyroid disease.  Past Surgical History: has a past surgical history that includes Esophagogastroduodenoscopy (egd) with propofol (N/A, 10/31/2019) and Colonoscopy with propofol (N/A, 11/02/2019).  Family History: Family history is unknown by patient.  Social History: reports that he has never smoked. He has never used smokeless tobacco. He reports that he does not drink alcohol or use drugs.  Current Medications:        Medications Prior to Admission  Medication Sig Dispense Refill  . acetaminophen (TYLENOL) 325 MG tablet Take 650 mg by mouth every 4 (four) hours as needed for mild pain or moderate pain.    Marland Kitchen albuterol (PROVENTIL HFA;VENTOLIN HFA) 108 (90 Base) MCG/ACT inhaler Inhale 1 puff into the lungs every 3 (three) hours as needed for wheezing or shortness of breath.     . Amino Acids-Protein Hydrolys (FEEDING SUPPLEMENT, PRO-STAT SUGAR FREE 64,) LIQD Take 30 mLs by mouth 3 (three) times daily with  meals. Sugar free    . ammonium lactate (LAC-HYDRIN) 12 % lotion Apply 1 application topically 2 (two) times daily. Apply topically to bilateral feet    . benztropine (COGENTIN) 0.5 MG tablet Take 0.5 mg by mouth 2 (two) times daily.    Marland Kitchen docusate sodium (COLACE) 100 MG capsule Take 100 mg by mouth 2 (two) times daily.    . fluPHENAZine (PROLIXIN) 5 MG tablet Take 3 tablets (15 mg total) by mouth daily. 30 tablet 0  . fluPHENAZine (PROLIXIN) 5 MG tablet Take 3 tablets (15 mg total) by mouth at bedtime. 30 tablet 0  . guaifenesin (ROBITUSSIN) 100 MG/5ML syrup Take 100 mg by mouth every 4 (four) hours as needed for cough.    . hydrocortisone (CORTEF) 10 MG tablet Take 1 tablet (10 mg total) by mouth 2 (two) times daily. 20 tablet 0  . hydroxychloroquine (PLAQUENIL) 200 MG tablet Take 200 mg by mouth 2 (two) times daily.    Marland Kitchen levothyroxine (SYNTHROID) 200 MCG tablet Take 200 mcg by mouth daily before breakfast.     . LORazepam (ATIVAN) 2 MG tablet Take 2 mg by mouth every 6 (six) hours as needed for anxiety.    . methocarbamol (ROBAXIN) 750 MG tablet Take 750 mg by mouth at bedtime.    . metoprolol tartrate (LOPRESSOR) 50 MG tablet Take 1 tablet (50 mg total) by mouth 2 (two) times daily.    . Multiple Vitamins-Minerals (MULTIVITAMIN WITH MINERALS) tablet Take 1 tablet by mouth daily.    . Polyethylene Glycol 3350 (MIRALAX PO) Take 17 g by mouth daily.    . tamsulosin (FLOMAX) 0.4 MG CAPS capsule Take 0.4 mg by mouth daily.    Marland Kitchen  traMADol (ULTRAM) 50 MG tablet Take 50 mg by mouth every 6 (six) hours as needed for moderate pain or severe pain.    . vitamin C (ASCORBIC ACID) 500 MG tablet Take 500 mg by mouth 2 (two) times daily.      Allergies:       Allergies  Allergen Reactions  . Methadone   . Penicillins     Tolerates cephalosporins   . Propoxyphene   . Sulfa Antibiotics   . Valproic Acid And Related     Thrombocytopenia   ROS:  Unable to obtain secondary to patient's baseline  mentation secondary to schizophrenia.  Objective:   BP (!) 156/78 (BP Location: Left Arm)  Pulse 75  Temp 98.6 F (37 C)  Resp 20  Ht 6\' 2"  (1.88 m)  Wt (!) 157.1 kg  SpO2 100%  BMI 44.48 kg/m  Constitutional :  alert, cooperative, appears stated age and no distress  Lymphatics/Throat:  supple without significant adenopathy  Respiratory:  clear to auscultation bilaterally  Cardiovascular:  regular rate and rhythm  Gastrointestinal: soft, non-tender; bowel sounds normal; no masses, no organomegaly.  Musculoskeletal: Steady movement  Skin: Cool and moist  Psychiatric: Normal affect, non-agitated,     LABS:  CMP Latest Ref Rng & Units 10/30/2019 09/14/2019 09/13/2019  Glucose 70 - 99 mg/dL 96 107(H) 99  BUN 8 - 23 mg/dL 12 23 24(H)  Creatinine 0.61 - 1.24 mg/dL 0.49(L) 0.49(L) 0.47(L)  Sodium 135 - 145 mmol/L 139 140 138  Potassium 3.5 - 5.1 mmol/L 3.8 4.0 3.6  Chloride 98 - 111 mmol/L 103 105 104  CO2 22 - 32 mmol/L 29 26 26   Calcium 8.9 - 10.3 mg/dL 8.6(L) 8.2(L) 8.0(L)  Total Protein 6.5 - 8.1 g/dL - 6.6 6.6  Total Bilirubin 0.3 - 1.2 mg/dL - 0.6 0.4  Alkaline Phos 38 - 126 U/L - 77 81  AST 15 - 41 U/L - 22 24  ALT 0 - 44 U/L - 17 16   CBC Latest Ref Rng & Units 11/02/2019 11/01/2019 10/31/2019  WBC 4.0 - 10.5 K/uL 4.3 - 5.5  Hemoglobin 13.0 - 17.0 g/dL 7.6(L) 7.5(L) 7.8(L)  Hematocrit 39.0 - 52.0 % 25.1(L) 25.3(L) 26.6(L)  Platelets 150 - 400 K/uL 255 - 249   RADS:  n/a  Assessment:   Chronic anemia, ulcerated ascending colon mass noted on recent colonoscopy. Consensus is this is likely the source of his chronic anemia and GI bleed. Surgery consulted for possible resection, but not good candidate.  Also asked to place port for IV access. Plan:   I also asked her about placing a port for future IV access. I again mentioned to her that he is at a high risk of complications secondary to his body habitus for this procedure, but POA was agreeable to proceed.  Risks include  bleeding, infection, injury to surrounding organs, port failure, and additional procedures to address said risks.  Benefits include permanent IV access solution.  Alternatives include continued peripheral IV access as needed.

## 2019-12-05 ENCOUNTER — Encounter
Admission: RE | Admit: 2019-12-05 | Discharge: 2019-12-05 | Disposition: A | Discharge status: Home / Self Care | Payer: Medicaid Other | Source: Ambulatory Visit | Attending: Surgery | Admitting: Surgery

## 2019-12-05 NOTE — Pre-Procedure Instructions (Signed)
EKG  ED ECG REPORT I, Jerry [name], the attending physician, personally viewed and interpreted this ECG.   Date: 09/11/2019  EKG Time: 12:24 AM  Rate: 73  Rhythm: Normal sinus rhythm  Axis: Normal  Intervals: Prolonged QTC 544  ST&T Change: None  ____________________________________________  RADIOLOGY I, [place] [name], personally viewed and evaluated these images (plain radiographs) as part of my medical decision making, as well as reviewing the written report by the radiologist.  ED MD interpretation: Chest x-ray consistent with progressive multifocal right lung opacities compatible with pneumonia since August comparisons per radiologist.  Official radiology report(s): Dg Chest Port 1 View  Result Date: 09/11/2019 CLINICAL DATA:  64 year old male with shortness of breath. Has been treated for pneumonia in the past couple of months. EXAM: PORTABLE CHEST 1 VIEW COMPARISON:  Portable chest and chest CT 07/16/2019 and earlier. FINDINGS: Portable AP semi upright view at 0032 hours. New patchy and indistinct right upper and lower lung opacity when compared to August. Persistent hypo ventilation in the left lower lung appears not significantly changed from that time. Only the left upper lobe appears relatively clear today. Stable cardiac size and mediastinal contours. Visualized tracheal air column is within normal limits. No pneumothorax.  No pleural effusion suspected. IMPRESSION: Progressed multifocal right lung opacity compatible with pneumonia since the August comparisons. Continued abnormal left lung base opacity. No pleural effusion. Electronically Signed   By: Jerry [name] M.D.   On: 09/11/2019 01:01    ____________________________________________   PROCEDURES    .Critical Care Performed by: Jerry Hams, MD Authorized by: Jerry Hams, MD   Critical care provider statement:    Critical care time (minutes):  30   Critical care time was exclusive  of:  Separately billable procedures and treating other patients (COVID-19 infection)   Critical care was necessary to treat or prevent imminent or life-threatening deterioration of the following conditions:  Respiratory failure   Critical care was time spent personally by me on the following activities:  Development of treatment plan with patient or surrogate, discussions with consultants, evaluation of patient's response to treatment, examination of patient, obtaining history from patient or surrogate, ordering and performing treatments and interventions, ordering and review of laboratory studies, ordering and review of radiographic studies, pulse oximetry, re-evaluation of patient's condition and review of old charts     ____________________________________________   INITIAL IMPRESSION / MDM / [place] / ED COURSE  As part of my medical decision making, I reviewed the following data within the electronic MEDICAL RECORD NUMBER   64 year old male presenting with above-stated history and physical exam concerning for COVID-19 infection, pneumonia, sepsis.  As such sepsis protocol was initiated.  Chest x-ray consistent with multifocal pneumonia however concern for COVID-19 especially given known outbreak of COVID-19 at [facility].  Patient's COVID swab positive.  Patient was given Decadron 20 mg IV shortly after arrival to the emergency department.  Patient currently on 4 L oxygen via nasal cannula.  Patient discussed with [facility] Olevia [name] [facility] who accepted the patient in transfer      Clinical Course as of Sep 10 414  Thu Sep 11, 2019  0047 30  Resp: 18 [RB]    Clinical Course User Index [RB] Jerry Hams, MD     ____________________________________________  FINAL CLINICAL IMPRESSION(S) / ED DIAGNOSES  Final diagnoses:  COVID-19 virus infection     MEDICATIONS GIVEN DURING THIS VISIT:  Medications  dexamethasone (DECADRON)  injection  20 mg (20 mg Intravenous Given 09/11/19 0048)  ceFEPIme (MAXIPIME) 2 g in sodium chloride 0.9 % 100 mL IVPB (0 g Intravenous Stopped 09/11/19 0220)  vancomycin (VANCOCIN) 2,500 mg in sodium chloride 0.9 % 500 mL IVPB (2,500 mg Intravenous New Bag/Given 09/11/19 0147)  ipratropium-albuterol (DUONEB) 0.5-2.5 (3) MG/3ML nebulizer solution 3 mL (3 mLs Nebulization Given 09/11/19 0148)  ipratropium-albuterol (DUONEB) 0.5-2.5 (3) MG/3ML nebulizer solution 3 mL (3 mLs Nebulization Given 09/11/19 0148)        ED Discharge Orders    None      *Please note:  Jerry [name] was evaluated in Emergency Department on 09/11/2019 for the symptoms described in the history of present illness. He was evaluated in the context of the global COVID-19 pandemic, which necessitated consideration that the patient might be at risk for infection with the SARS-CoV-2 virus that causes COVID-19. Institutional protocols and algorithms that pertain to the evaluation of patients at risk for COVID-19 are in a state of rapid change based on information released by regulatory bodies including the CDC and federal and state organizations. These policies and algorithms were followed during the patient's care in the ED.  Some ED evaluations and interventions may be delayed as a result of limited staffing during the pandemic.*  Note:  This document was prepared using Dragon voice recognition software and may include unintentional dictation errors.   Jerry Hams, MD 09/11/19 0424         Electronically signed by Jerry Hams, MD at 09/11/2019 4:24 AM   ED on 09/11/2019     Detailed Report

## 2019-12-05 NOTE — Patient Instructions (Signed)
Your procedure is scheduled on: 12-11-19 THURSDAY Report to Same Day Surgery 2nd floor medical mall [facility] Entrance-take elevator on left to 2nd floor.  Check in with surgery information desk.) To find out your arrival time please call [phone] between 1PM - 3PM on 12-10-19 [facility]  Remember: Instructions that are not followed completely may result in serious medical risk, up to and including death, or upon the discretion of your surgeon and anesthesiologist your surgery may need to be rescheduled.    _x___ 1. Do not eat food after midnight the night before your procedure. NO GUM OR CANDY AFTER MIDNIGHT. You may drink clear liquids up to 2 hours before you are scheduled to arrive at the hospital for your procedure.  Do not drink clear liquids within 2 hours of your scheduled arrival to the hospital.  Clear liquids include  --Water or Apple juice without pulp  --Gatorade  --Black Coffee or Clear Tea (No milk, no creamers, do not add anything to the coffee or Tea) Type 1 and type 2 diabetics should only drink water.   ____Ensure clear carbohydrate drink on the way to the hospital for bariatric patients  ____Ensure clear carbohydrate drink 3 hours before surgery.    __x__ 2. No Alcohol for 24 hours before or after surgery.   __x__3. No Smoking or e-cigarettes for 24 prior to surgery.  Do not use any chewable tobacco products for at least 6 hour prior to surgery   ____  4. Bring all medications with you on the day of surgery if instructed.    __x__ 5. Notify your doctor if there is any change in your medical condition     (cold, fever, infections).    x___6. On the morning of surgery brush your teeth with toothpaste and water.  You may rinse your mouth with mouth wash if you wish.  Do not swallow any toothpaste or mouthwash.   Do not wear jewelry, make-up, hairpins, clips or nail polish.  Do not wear lotions, powders, or perfumes.  Do not shave 48 hours prior to surgery.  Men may shave face and neck.  Do not bring valuables to the hospital.    [facility] is not responsible for any belongings or valuables.               Contacts, dentures or bridgework may not be worn into surgery.  Leave your suitcase in the car. After surgery it may be brought to your room.  For patients admitted to the hospital, discharge time is determined by your treatment team.  _  Patients discharged the day of surgery will not be allowed to drive home.  You will need someone to drive you home and stay with you the night of your procedure.    Please read over the following fact sheets that you were given:   [facility] Preparing for Surgery   _x___ TAKE THE FOLLOWING MEDICATIONS THE MORNING OF SURGERY WITH A SMALL SIP OF WATER. These include:  1. BENZTROPINE  2. FLUPHENAZINE  3. HYDROCORTISONE  4. LEVOTHYROXINE  5. METOPROLOL  6. PROTONIX   7. TAMSULOSIN  ____Fleets enema or Magnesium Citrate as directed.   _x___ Use sage wipes as directed on instruction sheet-USE ONLY THE NIGHT BEFORE SURGERY   _X___ Use inhalers on the day of surgery and bring to hospital day of [place]  ____ Stop Metformin and Janumet 2 days prior to surgery.    ____ Take 1/2 of  usual insulin dose the night before surgery and none on the morning surgery.   ____ Follow recommendations from Cardiologist, Pulmonologist or PCP regarding stopping Aspirin, Coumadin, Plavix ,Eliquis, Effient, or Pradaxa, and Pletal.  X____Stop Anti-inflammatories such as Advil, Aleve, Ibuprofen, Motrin, Naproxen, Naprosyn, Goodies powders or aspirin products NOW-OK to take Tylenol OR TRAMADOL IF NEEDED   ____ Stop supplements until after surgery.     ____ Bring C-Pap to the hospital.

## 2019-12-05 NOTE — Pre-Procedure Instructions (Signed)
[name] ECHO COMPLETE WO IMAGING ENHANCING AGENT Order# [id number] Reading physician: Minna Merritts, MD Ordering physician: Sidney Ace, MD Study date: 11/13/19  ECHOCARDIOGRAM COMPLETE (Accession [id number]) (Order [id number]) Echocardiography Date: 11/12/2019 Department: [facility] GENERAL SURGERY Released By/Authorizing: Sidney Ace, MD (auto-released)  ECHOCARDIOGRAM COMPLETE Order #: [id number] Accession #: [id number] Patient Info  Patient name: Jerry [name]  MRN: [id number]  Age: 64 y.o.  Sex: male  MyChart Results Release  MyChart Status: Pending Results Release  Vitals  BP Height Weight BSA (Calculated - sq m)  146/84 6\' 2"  (1.88 m) 141.2 kg 2.86 sq meters  Study Result  Result status: Final result    ECHOCARDIOGRAM REPORT       Patient Name:   Jerry [name] Date of Exam: 11/13/2019 Medical Rec #:  [id number]   Height:       74.0 in Accession #:    [id number]  Weight:       346.4 lb Date of Birth:  07/16/1956   BSA:          2.74 m Patient Age:    64 years    BP:           124/70 mmHg Patient Gender: M           HR:           101 bpm. Exam Location:  [facility]  Procedure: 2D Echo, Cardiac Doppler and Color Doppler  Indications:     CHF-acute diastolic [id number]   History:         Patient has prior history of Echocardiogram examinations, most                  recent 06/26/2017. CHF, COPD; Risk Factors:Hypertension.   Sonographer:     Sherrie Sport RDCS (AE) Referring Phys:  [id number] Sidney Ace Diagnosing Phys: Ida Rogue MD    Sonographer Comments: Technically challenging study due to limited acoustic windows, no apical window and no subcostal window. IMPRESSIONS    1. Left ventricular ejection fraction, by visual estimation, is 60 to 65%. The left ventricle has normal function. There is moderately increased left ventricular hypertrophy.  2. The left ventricle has no regional wall motion abnormalities.  3.  Global right ventricle has normal systolic function.The right ventricular size is normal. No increase in right ventricular wall thickness.  4. Left atrial size was mildly dilated.  5. TR signal is inadequate for assessing pulmonary artery systolic pressure.  FINDINGS  Left Ventricle: Left ventricular ejection fraction, by visual estimation, is 60 to 65%. The left ventricle has normal function. The left ventricle has no regional wall motion abnormalities. There is moderately increased left ventricular hypertrophy.  Normal left atrial pressure.  Right Ventricle: The right ventricular size is normal. No increase in right ventricular wall thickness. Global RV systolic function is has normal systolic function.  Left Atrium: Left atrial size was mildly dilated.  Right Atrium: Right atrial size was normal in size  Pericardium: There is no evidence of pericardial effusion.  Mitral Valve: The mitral valve is normal in structure. No evidence of mitral valve regurgitation. No evidence of mitral valve stenosis by observation.  Tricuspid Valve: The tricuspid valve is normal in structure. Tricuspid valve regurgitation is not demonstrated.  Aortic Valve: The aortic valve was not well visualized. Aortic valve regurgitation is not visualized. The aortic valve is structurally normal, with no evidence of sclerosis or stenosis.  Pulmonic Valve: The pulmonic valve was normal in structure.  Pulmonic valve regurgitation is not visualized. Pulmonic regurgitation is not visualized.  Aorta: The aortic root, ascending aorta and aortic arch are all structurally normal, with no evidence of dilitation or obstruction.  Venous: The inferior vena cava is normal in size with greater than 50% respiratory variability, suggesting right atrial pressure of 3 mmHg.  IAS/Shunts: No atrial level shunt detected by color flow Doppler. There is no evidence of a patent foramen ovale. No ventricular septal defect is seen or  detected. There is no evidence of an atrial septal defect.    LEFT VENTRICLE PLAX 2D LVIDd:         5.25 cm LVIDs:         2.98 cm LV PW:         1.33 cm LV IVS:        1.88 cm LVOT diam:     2.20 cm LV SV:         98 ml LV SV Index:   33.38 LVOT Area:     3.80 cm    LEFT ATRIUM         Index LA diam:    4.40 cm 1.60 cm/m                        PULMONIC VALVE AORTA                 PV Vmax:        0.92 m/s Ao Root diam: 3.10 cm PV Peak grad:   3.4 mmHg                       RVOT Peak grad: 4 mmHg      SHUNTS Systemic Diam: 2.20 cm    Ida Rogue MD Electronically signed by Ida Rogue MD Signature Date/Time: 11/13/2019/2:31:10 PM       Final    Syngo Images  Show images for ECHOCARDIOGRAM COMPLETE  Images on [employer]  Show images for [name]  Performing Technologist/Nurse  Performing Technologist/Nurse: Gabriel Cirri  Reason for Exam Priority: Routine Not on file  Patient Data  Height 74 in    BP 124/70 mmHg                            Surgical History  Surgical History  No past medical history on file.    Other Surgical History  Procedure Laterality Date Comment Source  COLONOSCOPY WITH PROPOFOL N/A 11/02/2019 Procedure: COLONOSCOPY WITH PROPOFOL; Surgeon: Lucilla Lame, MD; Location: [facility] ENDOSCOPY; Service: Endoscopy; Laterality: N/A;   COLONOSCOPY WITH PROPOFOL N/A 11/04/2019 Procedure: COLONOSCOPY WITH PROPOFOL; Surgeon: Jonathon Bellows, MD; Location: [facility] ENDOSCOPY; Service: Gastroenterology; Laterality: N/A;   ESOPHAGOGASTRODUODENOSCOPY (EGD) WITH PROPOFOL N/A 10/31/2019 Procedure: ESOPHAGOGASTRODUODENOSCOPY (EGD) WITH PROPOFOL; Surgeon: Virgel Manifold, MD; Location: [facility] ENDOSCOPY; Service: Endoscopy; Laterality: N/A;     Implants   No active implants to display in this view.  Encounter-Level Documents - 10/30/2019:  Scan on 11/24/2019 3:06 PM by Default, Provider, MD Document on  11/20/2019 11:04 AM by Chuck Hint, RN: IP After Visit Summary Document on 11/10/2019 1:47 PM by Fuller Mandril, RN: IP After Visit Summary Document on 11/04/2019 1:17 PM by Norva Riffle, RN: ED PB Billing Extract Scan on 11/03/2019 5:18 PM by Default, Provider, MD Scan on 11/03/2019 4:27 PM by Default, Provider, MD Electronic signature on 10/30/2019 2:58 PM - Not e-signed Scan  on 10/30/2019 3:01 PM by Riley Churches J: aob     Order-Level Documents - 10/30/2019:  Scan on 11/13/2019 2:31 PM by Default, Provider, MD Scan on 11/04/2019 11:20 AM by Jonathon Bellows, MD Scan on 11/24/2019 3:44 PM by Default, Provider, MD Scan on 11/02/2019 11:05 AM by Lucilla Lame, MD Scan on 10/31/2019 2:01 PM by Virgel Manifold, MD     Resulted by:  Signed Date/Time  Phone Pager  Minna Merritts 11/13/2019 2:31 PM [phone]   External Result Report  External Result Report

## 2019-12-08 NOTE — Pre-Procedure Instructions (Signed)
SPOKE WITH PTS POA, WHO IS [name] (NIECE) WILL NOT BE THERE DAY OF SURGERY TO SIGN CONSENTS. SHE STATES THAT SHE HAS RESIDENT AGENTS DESIGNATED THAT CAN SIGN PTS CONSENTS DOS. IT WILL EITHER BE AVA JUNIOR OR LISA CARTER THERE TO SIGN HIS CONSENTS. # GIVEN TO [name] FOR PTS ARRIVAL TIME TO HOSPITAL AND WHERE TO GO FOR PTS SURGERY

## 2019-12-08 NOTE — Pre-Procedure Instructions (Signed)
Flora Lipps, MD  Physician  Pulmonology  Progress Notes     Signed  Date of Service:  11/12/2019 1:45 PM          Signed         Show:Clear all [x] Manual[x] Template[] Copied  Added by: [x] Flora Lipps, MD  [] Hover for details Case Discussed with Dr Priscella Mann Patient with increased SOB and hypoxia CXR with progressive lower lobe predominant infiltrates from 2 days ago CT chest 11/07/19 shows lower lobe ILD c/w fibrosis COVID + 2 months ago  Dx most likely related to ILD with pulm edema in setting of morbid obesity with Hypoventilation syndrome and OSA, need to assess systolic and diastolic cardiac dysfunction Pneumonia less likely as patient had NL WBC  And no fevers Post COVID PNEUMONITIS ALSO POSSIBILITY  Recommend ECHO Recommend LASIX as tolerated, if CO2 increases, recommend diamox Recommend Nocturnal CPAP Recommend BD therapy  Agree with Palliative care, patient is DNR   Corrin Parker, M.D.  [facility]  Medical Director [facility] Director [place] Department            Electronically signed by Flora Lipps, MD at 11/12/2019 2:04 PM

## 2019-12-09 ENCOUNTER — Other Ambulatory Visit: Payer: Self-pay

## 2019-12-09 ENCOUNTER — Inpatient Hospital Stay: Payer: Medicaid Other | Admitting: Internal Medicine

## 2019-12-09 ENCOUNTER — Telehealth: Payer: Self-pay | Admitting: *Deleted

## 2019-12-09 NOTE — Telephone Encounter (Addendum)
Patient arrived 30 mins late for his apt in the cancer center.  MD unable to see patient-not available. Pt has port a cath planned to be placed this week. Dr. B notified. Md states that patient could be r/s after his port was placed. "most likely would require chemotherapy for colon cancer." Dr. B will reach out to patient's HPOA "[name]" to discuss pt's care.

## 2019-12-10 ENCOUNTER — Other Ambulatory Visit: Payer: Self-pay | Admitting: *Deleted

## 2019-12-10 ENCOUNTER — Telehealth: Payer: Self-pay | Admitting: Internal Medicine

## 2019-12-10 ENCOUNTER — Other Ambulatory Visit: Payer: Self-pay | Admitting: Internal Medicine

## 2019-12-10 ENCOUNTER — Other Ambulatory Visit
Admission: RE | Admit: 2019-12-10 | Discharge: 2019-12-10 | Disposition: A | Discharge status: Home / Self Care | Payer: Medicaid Other | Source: Ambulatory Visit | Attending: Surgery | Admitting: Surgery

## 2019-12-10 DIAGNOSIS — Z01812 Encounter for preprocedural laboratory examination: Secondary | ICD-10-CM | POA: Diagnosis present

## 2019-12-10 DIAGNOSIS — Z20822 Contact with and (suspected) exposure to covid-19: Secondary | ICD-10-CM | POA: Diagnosis not present

## 2019-12-10 DIAGNOSIS — D500 Iron deficiency anemia secondary to blood loss (chronic): Secondary | ICD-10-CM | POA: Insufficient documentation

## 2019-12-10 LAB — SARS CORONAVIRUS 2 (TAT 6-24 HRS): SARS Coronavirus 2: NEGATIVE

## 2019-12-10 MED ORDER — CLINDAMYCIN PHOSPHATE 900 MG/50ML IV SOLN: 900.0000 mg | INTRAVENOUS | Status: DC

## 2019-12-10 MED ORDER — LIDOCAINE-PRILOCAINE 2.5-2.5 % EX CREA: 1.0000 "application " | TOPICAL_CREAM | CUTANEOUS | 0 refills | Status: AC | PRN

## 2019-12-10 NOTE — Telephone Encounter (Signed)
Per Dr. Tish Men - will hold off on making any apts at this time. Will wait on port placement. Due to pt's limited mobility and related infusion safety concerns, pt unable to receive iron infusions at this time - via outpatient.  Dr. Jacinto Reap will investigate if infusions could be given in facility.

## 2019-12-10 NOTE — Telephone Encounter (Signed)
Patient late for appointment by ~ 30 minutes.  Canceled the appointment.  Patient has poor IV access; awaiting port placement on 1/14.  For IV iron infusions.  Recent diagnosis of colon cancer-intact primary.  Poor candidate for definitive surgery. Previously discussed with Dr.Sakai.   Spoke to patient's niece-[name]/HPOA-discussed our recommendations; in agreement.   # Please schedule in next week- MD; lab-CBC BMP/IV Venofer.   H- please check with Stacey/Kim re: pt's ability to get infusions in cancer center/ given his limited mobility.  ----------------------------------------------------   FYI-Dr.Sakai.

## 2019-12-10 NOTE — Addendum Note (Signed)
Addended by: Sabino Gasser on: 12/10/2019 08:56 AM   Modules accepted: Orders

## 2019-12-10 NOTE — Progress Notes (Signed)
Heather/Taimika- if pt able to get his IV iron infusions here; please send script for EMLA cream.  GB

## 2019-12-11 ENCOUNTER — Encounter: Payer: Self-pay | Admitting: Surgery

## 2019-12-11 ENCOUNTER — Ambulatory Visit: Payer: Medicaid Other | Admitting: Anesthesiology

## 2019-12-11 ENCOUNTER — Ambulatory Visit: Payer: Medicaid Other

## 2019-12-11 ENCOUNTER — Ambulatory Visit
Admission: RE | Admit: 2019-12-11 | Discharge: 2019-12-11 | Disposition: A | Discharge status: Home / Self Care | Payer: Medicaid Other | Attending: Surgery | Admitting: Surgery

## 2019-12-11 ENCOUNTER — Encounter
Admission: RE | Disposition: A | Discharge status: Home / Self Care | Payer: Self-pay | Source: Home / Self Care | Attending: Surgery

## 2019-12-11 DIAGNOSIS — Z6841 Body Mass Index (BMI) 40.0 and over, adult: Secondary | ICD-10-CM | POA: Diagnosis not present

## 2019-12-11 DIAGNOSIS — Z881 Allergy status to other antibiotic agents status: Secondary | ICD-10-CM | POA: Diagnosis not present

## 2019-12-11 DIAGNOSIS — F209 Schizophrenia, unspecified: Secondary | ICD-10-CM | POA: Diagnosis not present

## 2019-12-11 DIAGNOSIS — Z882 Allergy status to sulfonamides status: Secondary | ICD-10-CM | POA: Diagnosis not present

## 2019-12-11 DIAGNOSIS — I110 Hypertensive heart disease with heart failure: Secondary | ICD-10-CM | POA: Diagnosis not present

## 2019-12-11 DIAGNOSIS — Z95828 Presence of other vascular implants and grafts: Secondary | ICD-10-CM

## 2019-12-11 DIAGNOSIS — I509 Heart failure, unspecified: Secondary | ICD-10-CM | POA: Diagnosis not present

## 2019-12-11 DIAGNOSIS — Z880 Allergy status to penicillin: Secondary | ICD-10-CM | POA: Insufficient documentation

## 2019-12-11 DIAGNOSIS — E079 Disorder of thyroid, unspecified: Secondary | ICD-10-CM | POA: Insufficient documentation

## 2019-12-11 DIAGNOSIS — J449 Chronic obstructive pulmonary disease, unspecified: Secondary | ICD-10-CM | POA: Diagnosis not present

## 2019-12-11 DIAGNOSIS — N400 Enlarged prostate without lower urinary tract symptoms: Secondary | ICD-10-CM | POA: Insufficient documentation

## 2019-12-11 DIAGNOSIS — Z79899 Other long term (current) drug therapy: Secondary | ICD-10-CM | POA: Insufficient documentation

## 2019-12-11 DIAGNOSIS — M069 Rheumatoid arthritis, unspecified: Secondary | ICD-10-CM | POA: Diagnosis not present

## 2019-12-11 DIAGNOSIS — Z79890 Hormone replacement therapy (postmenopausal): Secondary | ICD-10-CM | POA: Insufficient documentation

## 2019-12-11 DIAGNOSIS — Z888 Allergy status to other drugs, medicaments and biological substances status: Secondary | ICD-10-CM | POA: Insufficient documentation

## 2019-12-11 DIAGNOSIS — D500 Iron deficiency anemia secondary to blood loss (chronic): Secondary | ICD-10-CM

## 2019-12-11 DIAGNOSIS — C182 Malignant neoplasm of ascending colon: Secondary | ICD-10-CM | POA: Insufficient documentation

## 2019-12-11 HISTORY — PX: PORTACATH PLACEMENT: SHX2246

## 2019-12-11 SURGERY — INSERTION, TUNNELED CENTRAL VENOUS DEVICE, WITH PORT
Anesthesia: General | Site: Chest | Laterality: Right

## 2019-12-11 MED ORDER — MIDAZOLAM HCL 2 MG/2ML IJ SOLN
INTRAMUSCULAR | Status: AC
  Filled 2019-12-11: qty 2

## 2019-12-11 MED ORDER — FLUPHENAZINE HCL 5 MG PO TABS: 15.0000 mg | ORAL_TABLET | Freq: Two times a day (BID) | ORAL | Status: DC

## 2019-12-11 MED ORDER — BUPIVACAINE HCL (PF) 0.5 % IJ SOLN
INTRAMUSCULAR | Status: AC
  Filled 2019-12-11: qty 30

## 2019-12-11 MED ORDER — PROPOFOL 10 MG/ML IV BOLUS
INTRAVENOUS | Status: AC
  Filled 2019-12-11: qty 20

## 2019-12-11 MED ORDER — FAMOTIDINE 20 MG PO TABS: 20.0000 mg | ORAL_TABLET | Freq: Once | ORAL | Status: DC

## 2019-12-11 MED ORDER — PROMETHAZINE HCL 25 MG/ML IJ SOLN: 6.2500 mg | INTRAMUSCULAR | Status: DC | PRN

## 2019-12-11 MED ORDER — HEPARIN SOD (PORK) LOCK FLUSH 100 UNIT/ML IV SOLN
INTRAVENOUS | Status: AC
  Filled 2019-12-11: qty 5

## 2019-12-11 MED ORDER — FENTANYL CITRATE (PF) 100 MCG/2ML IJ SOLN: 25.0000 ug | INTRAMUSCULAR | Status: DC | PRN

## 2019-12-11 MED ORDER — LABETALOL HCL 5 MG/ML IV SOLN
INTRAVENOUS | Status: DC | PRN
  Administered 2019-12-11 (×3): 5 mg via INTRAVENOUS

## 2019-12-11 MED ORDER — PROPOFOL 10 MG/ML IV BOLUS
INTRAVENOUS | Status: DC | PRN
  Administered 2019-12-11: 100 mg via INTRAVENOUS
  Administered 2019-12-11: 50 mg via INTRAVENOUS
  Administered 2019-12-11: 20 mg via INTRAVENOUS

## 2019-12-11 MED ORDER — FUROSEMIDE 20 MG PO TABS: 20.0000 mg | ORAL_TABLET | Freq: Two times a day (BID) | ORAL | Status: DC

## 2019-12-11 MED ORDER — HEPARIN SOD (PORK) LOCK FLUSH 100 UNIT/ML IV SOLN
INTRAVENOUS | Status: DC | PRN
  Administered 2019-12-11: 500 [IU] via INTRAVENOUS

## 2019-12-11 MED ORDER — ONDANSETRON HCL 4 MG/2ML IJ SOLN
INTRAMUSCULAR | Status: AC
  Filled 2019-12-11: qty 2

## 2019-12-11 MED ORDER — ACETAMINOPHEN 160 MG/5ML PO SOLN
325.0000 mg | ORAL | Status: DC | PRN
  Filled 2019-12-11: qty 20.3

## 2019-12-11 MED ORDER — FENTANYL CITRATE (PF) 100 MCG/2ML IJ SOLN
INTRAMUSCULAR | Status: DC | PRN
  Administered 2019-12-11 (×2): 50 ug via INTRAVENOUS

## 2019-12-11 MED ORDER — MIDAZOLAM HCL 2 MG/2ML IJ SOLN
INTRAMUSCULAR | Status: DC | PRN
  Administered 2019-12-11: 1 mg via INTRAVENOUS

## 2019-12-11 MED ORDER — BUPIVACAINE-EPINEPHRINE 0.5% -1:200000 IJ SOLN
INTRAMUSCULAR | Status: DC | PRN
  Administered 2019-12-11: 6 mL

## 2019-12-11 MED ORDER — DEXAMETHASONE SODIUM PHOSPHATE 10 MG/ML IJ SOLN
INTRAMUSCULAR | Status: DC | PRN
  Administered 2019-12-11: 5 mg via INTRAVENOUS

## 2019-12-11 MED ORDER — DEXAMETHASONE SODIUM PHOSPHATE 10 MG/ML IJ SOLN
INTRAMUSCULAR | Status: AC
  Filled 2019-12-11: qty 1

## 2019-12-11 MED ORDER — ALBUTEROL SULFATE HFA 108 (90 BASE) MCG/ACT IN AERS
INHALATION_SPRAY | RESPIRATORY_TRACT | Status: DC | PRN
  Administered 2019-12-11: 5 via RESPIRATORY_TRACT

## 2019-12-11 MED ORDER — FENTANYL CITRATE (PF) 100 MCG/2ML IJ SOLN
INTRAMUSCULAR | Status: AC
  Filled 2019-12-11: qty 2

## 2019-12-11 MED ORDER — VASOPRESSIN 20 UNIT/ML IV SOLN
INTRAVENOUS | Status: AC
  Filled 2019-12-11: qty 1

## 2019-12-11 MED ORDER — IPRATROPIUM-ALBUTEROL 0.5-2.5 (3) MG/3ML IN SOLN
RESPIRATORY_TRACT | Status: AC
  Administered 2019-12-11: 3 mL via RESPIRATORY_TRACT
  Filled 2019-12-11: qty 3

## 2019-12-11 MED ORDER — LACTATED RINGERS IV SOLN: INTRAVENOUS | Status: DC

## 2019-12-11 MED ORDER — EPINEPHRINE PF 1 MG/ML IJ SOLN
INTRAMUSCULAR | Status: AC
  Filled 2019-12-11: qty 1

## 2019-12-11 MED ORDER — LIDOCAINE HCL (PF) 2 % IJ SOLN
INTRAMUSCULAR | Status: AC
  Filled 2019-12-11: qty 10

## 2019-12-11 MED ORDER — IPRATROPIUM-ALBUTEROL 0.5-2.5 (3) MG/3ML IN SOLN: 3.0000 mL | Freq: Once | RESPIRATORY_TRACT | Status: AC

## 2019-12-11 MED ORDER — CHLORHEXIDINE GLUCONATE CLOTH 2 % EX PADS: 6.0000 | MEDICATED_PAD | Freq: Once | CUTANEOUS | Status: DC

## 2019-12-11 MED ORDER — LIDOCAINE HCL (CARDIAC) PF 100 MG/5ML IV SOSY
PREFILLED_SYRINGE | INTRAVENOUS | Status: DC | PRN
  Administered 2019-12-11: 80 mg via INTRAVENOUS

## 2019-12-11 MED ORDER — ACETAMINOPHEN 325 MG PO TABS: 325.0000 mg | ORAL_TABLET | ORAL | Status: DC | PRN

## 2019-12-11 MED ORDER — PHENYLEPHRINE HCL (PRESSORS) 10 MG/ML IV SOLN
INTRAVENOUS | Status: DC | PRN
  Administered 2019-12-11 (×3): 100 ug via INTRAVENOUS
  Administered 2019-12-11: 200 ug via INTRAVENOUS
  Administered 2019-12-11 (×2): 100 ug via INTRAVENOUS
  Administered 2019-12-11: 200 ug via INTRAVENOUS
  Administered 2019-12-11: 100 ug via INTRAVENOUS

## 2019-12-11 MED ORDER — VASOPRESSIN 20 UNIT/ML IV SOLN
INTRAVENOUS | Status: DC | PRN
  Administered 2019-12-11 (×2): 1 [IU] via INTRAVENOUS

## 2019-12-11 SURGICAL SUPPLY — 33 items
BAG DECANTER FOR FLEXI CONT (MISCELLANEOUS) ×3 IMPLANT
BENZOIN TINCTURE PRP APPL 2/3 (GAUZE/BANDAGES/DRESSINGS) ×3 IMPLANT
BLADE SURG SZ11 CARB STEEL (BLADE) ×3 IMPLANT
BOOT SUTURE AID YELLOW STND (SUTURE) ×3 IMPLANT
CANISTER SUCT 1200ML W/VALVE (MISCELLANEOUS) ×3 IMPLANT
CHLORAPREP W/TINT 26 (MISCELLANEOUS) ×3 IMPLANT
CLOSURE WOUND 1/2 X4 (GAUZE/BANDAGES/DRESSINGS) ×1
COVER LIGHT HANDLE STERIS (MISCELLANEOUS) ×6 IMPLANT
COVER WAND RF STERILE (DRAPES) IMPLANT
DECANTER SPIKE VIAL GLASS SM (MISCELLANEOUS) ×6 IMPLANT
DRAPE C-ARM XRAY 36X54 (DRAPES) ×3 IMPLANT
DRSG TEGADERM 4X4.75 (GAUZE/BANDAGES/DRESSINGS) ×3 IMPLANT
ELECT CAUTERY BLADE 6.4 (BLADE) ×3 IMPLANT
ELECT REM PT RETURN 9FT ADLT (ELECTROSURGICAL) ×3
ELECTRODE REM PT RTRN 9FT ADLT (ELECTROSURGICAL) ×1 IMPLANT
GLOVE BIOGEL PI IND STRL 7.0 (GLOVE) ×6 IMPLANT
GLOVE BIOGEL PI INDICATOR 7.0 (GLOVE) ×12
GLOVE SURG SYN 6.5 ES PF (GLOVE) ×18 IMPLANT
GOWN STRL REUS W/ TWL LRG LVL3 (GOWN DISPOSABLE) ×3 IMPLANT
GOWN STRL REUS W/TWL LRG LVL3 (GOWN DISPOSABLE) ×6
IV NS 500ML (IV SOLUTION) ×2
IV NS 500ML BAXH (IV SOLUTION) ×1 IMPLANT
KIT PORT POWER 8FR ISP CVUE (Port) ×3 IMPLANT
KIT TURNOVER KIT A (KITS) ×3 IMPLANT
LABEL OR SOLS (LABEL) ×3 IMPLANT
PACK PORT-A-CATH (MISCELLANEOUS) ×3 IMPLANT
SPONGE VERSALON 4X4 4PLY (MISCELLANEOUS) ×3 IMPLANT
STRIP CLOSURE SKIN 1/2X4 (GAUZE/BANDAGES/DRESSINGS) ×2 IMPLANT
SUT MNCRL AB 4-0 PS2 18 (SUTURE) ×3 IMPLANT
SUT PROLENE 2 0 SH DA (SUTURE) ×3 IMPLANT
SUT VIC AB 3-0 SH 27 (SUTURE) ×2
SUT VIC AB 3-0 SH 27X BRD (SUTURE) ×1 IMPLANT
SYR 10ML LL (SYRINGE) ×6 IMPLANT

## 2019-12-11 NOTE — Discharge Instructions (Signed)
Port placement, Care After This sheet gives you information about how to care for yourself after your procedure. Your health care provider may also give you more specific instructions. If you have problems or questions, contact your health care provider. What can I expect after the procedure? After the procedure, it is common to have:  Soreness.  Bruising.  Itching. Follow these instructions at home: site care Follow instructions from your health care provider about how to take care of your site. Make sure you:  Wash your hands with soap and water before and after you change your bandage (dressing). If soap and water are not available, use hand sanitizer.  Leave stitches (sutures), skin glue, or adhesive strips in place. These skin closures may need to stay in place for 2 weeks or longer. If adhesive strip edges start to loosen and curl up, you may trim the loose edges. Do not remove adhesive strips completely unless your health care provider tells you to do that.  If the area bleeds or bruises, apply gentle pressure for 10 minutes.  OK TO SHOWER IN 24HRS  Check your site every day for signs of infection. Check for:  Redness, swelling, or pain.  Fluid or blood.  Warmth.  Pus or a bad smell.  General instructions  Rest and then return to your normal activities as told by your health care provider. .  tylenol and advil as needed for discomfort.  Please alternate between the two every four hours as needed for pain.   .  Use narcotics, if prescribed, only when tylenol and motrin is not enough to control pain. .  325-650mg  every 8hrs to max of 3000mg /24hrs (including the 325mg  in every norco dose) for the tylenol.   .  Advil up to 800mg  per dose every 8hrs as needed for pain.    Keep all follow-up visits as told by your health care provider. This is important. Contact a health care provider if:  You have redness, swelling, or pain around your site.  You have fluid or blood  coming from your site.  Your site feels warm to the touch.  You have pus or a bad smell coming from your site.  You have a fever.  Your sutures, skin glue, or adhesive strips loosen or come off sooner than expected. Get help right away if:  You have bleeding that does not stop with pressure or a dressing. Summary  After the procedure, it is common to have some soreness, bruising, and itching at the site.  Follow instructions from your health care provider about how to take care of your site.  Check your site every day for signs of infection.  Contact a health care provider if you have redness, swelling, or pain around your site, or your site feels warm to the touch.  Keep all follow-up visits as told by your health care provider. This is important. This information is not intended to replace advice given to you by your health care provider. Make sure you discuss any questions you have with your health care provider. Document Released: 12/10/2015 Document Revised: 05/13/2018 Document Reviewed: 05/13/2018 Elsevier Interactive Patient Education  2019 [place]   1) The drugs that you were given will stay in your system until tomorrow so for the next 24 hours you should not:  A) Drive an automobile B) Make any legal decisions C) Drink any alcoholic beverage   2) You may resume regular meals tomorrow.  Today  it is better to start with liquids and gradually work up to solid foods.  You may eat anything you prefer, but it is better to start with liquids, then soup and crackers, and gradually work up to solid foods.   3) Please notify your doctor immediately if you have any unusual bleeding, trouble breathing, redness and pain at the surgery site, drainage, fever, or pain not relieved by medication.    4) Additional Instructions:        Please contact your physician with any problems or Same Day Surgery at [phone],  Monday through Friday 6 am to 4 pm, or [place] at [facility] number at [phone].AMBULATORY SURGERY  DISCHARGE INSTRUCTIONS   5) The drugs that you were given will stay in your system until tomorrow so for the next 24 hours you should not:  D) Drive an automobile E) Make any legal decisions F) Drink any alcoholic beverage   6) You may resume regular meals tomorrow.  Today it is better to start with liquids and gradually work up to solid foods.  You may eat anything you prefer, but it is better to start with liquids, then soup and crackers, and gradually work up to solid foods.   7) Please notify your doctor immediately if you have any unusual bleeding, trouble breathing, redness and pain at the surgery site, drainage, fever, or pain not relieved by medication.    8) Additional Instructions:    Please contact your physician with any problems or Same Day Surgery at [phone], Monday through Friday 6 am to 4 pm, or [place] at [facility] number at [phone].

## 2019-12-11 NOTE — Anesthesia Procedure Notes (Addendum)
Procedure Name: Intubation Date/Time: 12/11/2019 12:05 PM Performed by: Jerrye Noble, CRNA Pre-anesthesia Checklist: Patient identified, Emergency Drugs available, Suction available and Patient being monitored Patient Re-evaluated:Patient Re-evaluated prior to induction Oxygen Delivery Method: Circle system utilized Preoxygenation: Pre-oxygenation with 100% oxygen Induction Type: IV induction Ventilation: Mask ventilation without difficulty Laryngoscope Size: McGraph and 4 Tube type: Oral Tube size: 7.5 mm Number of attempts: 1 Airway Equipment and Method: Stylet and Oral airway Placement Confirmation: ETT inserted through vocal cords under direct vision,  positive ETCO2 and breath sounds checked- equal and bilateral Secured at: 22 cm Tube secured with: Tape Dental Injury: Teeth and Oropharynx as per pre-operative assessment  Comments: DVL and Intubation done by Simeon Craft under supervision of Chancy Hurter CRNA.

## 2019-12-11 NOTE — Transfer of Care (Signed)
Immediate Anesthesia Transfer of Care Note  Patient: Jerry [name]  Procedure(s) Performed: INSERTION PORT-A-CATH (Right Chest)  Patient Location: PACU  Anesthesia Type:General  Level of Consciousness: awake and alert   Airway & Oxygen Therapy: Patient Spontanous Breathing and Patient connected to face mask oxygen  Post-op Assessment: Report given to RN and Post -op Vital signs reviewed and stable  Post vital signs: Reviewed and stable  Last Vitals:  Vitals Value Taken Time  BP 121/58 12/11/19 1334  Temp    Pulse 84 12/11/19 1344  Resp 23 12/11/19 1344  SpO2 97 % 12/11/19 1344  Vitals shown include unvalidated device data.  Last Pain:  Vitals:   12/11/19 0734  TempSrc: Tympanic  PainSc: 5          Complications: No apparent anesthesia complications

## 2019-12-11 NOTE — Progress Notes (Signed)
Spoke with patients niece ava. Patient was stating he was walking prior to coming in for surgery. Per Niece patient was not walking and has not been. He has also been coughing up stuff as well. Patient has been evaluated by   Dr Lavone Neri multiple times. Per Dr Lavone Neri if systolic is equal or greater than 100 and O2 is 92% he is good to go. Patient does have a congested cough and on oxygen 2 L at [facility]

## 2019-12-11 NOTE — Progress Notes (Signed)
Pt recovered and awaiting ride from [employer].  Report to [facility] charge nurse [address] hall.  Legal guardian notified of pt  Status and return to [facility].

## 2019-12-11 NOTE — Op Note (Signed)
--  OP NOTE  DATE OF PROCEDURE: 12/11/2019   SURGEON: Lysle Pearl  ANESTHESIA: GETA  PRE-OPERATIVE DIAGNOSIS:: Colon cancer requring port for palliative IV infusion therapy  POST-OPERATIVE DIAGNOSIS: same  PROCEDURE(S):  1.) Percutaneous access of right IJ vein under ultrasound guidance   2.) Insertion of tunneled central venous catheter with subcutaneous port  INTRAOPERATIVE FINDINGS: Patent easily compressible right IJ vein with appropriate respiratory variations and well-secured tunneled central venous catheter with subcutaneous port at completion of the procedure, heplocked after confirming ease of draw and push  ESTIMATED BLOOD LOSS: Minimal (<20 mL)   SPECIMENS: None   IMPLANTS: 8F tunneled Bard PowerPort central venous catheter with subcutaneous port  DRAINS: None   COMPLICATIONS: None apparent   CONDITION AT COMPLETION: Hemodynamically stable, awake   DISPOSITION: PACU   INDICATION(S) FOR PROCEDURE:  Patient is a 64 y.o. male who presented with above diagnosis.  All risks, benefits, and alternatives to above elective procedures were discussed with the patient, who elected to proceed, and informed consent was accordingly obtained at that time.  DETAILS OF PROCEDURE:  Patient was brought to the operative suite and appropriately identified. In Trendelenburg position, right IJ venous access site was prepped and draped in the usual sterile fashion, and following a timeout, percutaneous venous access was obtained under ultrasound guidance using Seldinger technique, by which local anesthetic was injected over the area, and access needle was inserted under direct ultrasound visualization through which soft guidewire was advanced with no resistance, over which access needle was withdrawn.  IJ identified via large noncompressible, nonpulsatile vessel with adjacent smaller pulsatile carotid artery.   Guidewire was secured, attention was directed to injection of local anesthetic along the  planned tunnel site, 2-3 cm transverse right chest incision was made and confirmed to accommodate the subcutaneous port, and flushed catheter was tunneled retrograde from the port site over to the vein access site. Insertion sheath was advanced over the guidewire, which was withdrawn along with the insertion sheath dilator with no resistance. The catheter was introduced through the sheath and tip left in the [place] junction under fluoro guidance and catheter cut to appropriate length.  Catheter connected to port and placed within planned fixation site.  Fluro confirmed no kink within the entire length of the catheter at this point.  Port fixed to the pocket on two sides with 3-0 Prolene to avoid twisting. Port was confirmed to withdraw blood and flush easily with included Heuber needle, and port heplocked. Dermis at the subcutaneous pocket was re-approximated using buried interrupted 3-0 Vicryl suture, and 4-0 Monocryl suture was used to re-approximate skin at the insertion and subcutaneous port sites in running subcuticular fashion.  Incisions then dressed with steristrips, 2x2, and tegaderm. Patient was then awakened from anesthesia and transferred to PACU in stable condition.  [place] images saved in paper chart and fluoro images saved in Epic.  CXR post op confirmed proper placement of port and no evidence of pneumothorax.

## 2019-12-11 NOTE — Interval H&P Note (Signed)
History and Physical Interval Note:  12/11/2019 7:47 AM  Jerry [name]  has presented today for surgery, with the diagnosis of C18.2 Malignant neoplasm of ascending colon.  The various methods of treatment have been discussed with the patient and family. After consideration of risks, benefits and other options for treatment, the patient has consented to  Procedure(s): INSERTION PORT-A-CATH (N/A) as a surgical intervention.  The patient's history has been reviewed, patient examined, no change in status, stable for surgery.  I have reviewed the patient's chart and labs.  Questions were answered to the patient's satisfaction.     Kraig Genis Lysle Pearl

## 2019-12-11 NOTE — Anesthesia Preprocedure Evaluation (Addendum)
Anesthesia Evaluation  Patient identified by MRN, date of birth, ID band Patient awake    Reviewed: Allergy & Precautions, H&P , NPO status , reviewed documented beta blocker date and time   Airway Mallampati: III  TM Distance: >3 FB Neck ROM: limited  Mouth opening: Limited Mouth Opening  Dental  (+) Chipped, Missing   Pulmonary pneumonia, resolved, COPD,   Mild B basilar crackles otherwise clear  breath sounds clear to auscultation       Cardiovascular hypertension, +CHF  Normal cardiovascular exam  11/13/2019 ECHO IMPRESSIONS    1. Left ventricular ejection fraction, by visual estimation, is 60 to 65%. The left ventricle has normal function. There is moderately increased left ventricular hypertrophy.  2. The left ventricle has no regional wall motion abnormalities.  3. Global right ventricle has normal systolic function.The right ventricular size is normal. No increase in right ventricular wall thickness.  4. Left atrial size was mildly dilated.  5. TR signal is inadequate for assessing pulmonary artery systolic pressure.   Neuro/Psych PSYCHIATRIC DISORDERS Schizophrenia    GI/Hepatic   Endo/Other    Renal/GU      Musculoskeletal  (+) Arthritis ,   Abdominal   Peds  Hematology  (+) Blood dyscrasia, anemia ,   Anesthesia Other Findings Past Medical History: No date: BPH (benign prostatic hyperplasia) No date: CHF (congestive heart failure) (HCC) No date: Chronic pain No date: COPD (chronic obstructive pulmonary disease) (HCC) No date: Hypertension No date: Morbid obesity ([place]) No date: Pressure ulcer No date: Rheumatoid arthritis ([place]) No date: Schizophrenia ([place]) No date: Thyroid disease Past Surgical History: 11/02/2019: COLONOSCOPY WITH PROPOFOL; N/A     Comment:  Procedure: COLONOSCOPY WITH PROPOFOL;  Surgeon: Lucilla Lame, MD;  Location: [facility] ENDOSCOPY;  Service:   Endoscopy;  Laterality: N/A; 11/04/2019: COLONOSCOPY WITH PROPOFOL; N/A     Comment:  Procedure: COLONOSCOPY WITH PROPOFOL;  Surgeon: Jonathon Bellows, MD;  Location: [facility] ENDOSCOPY;  Service:               Gastroenterology;  Laterality: N/A; 10/31/2019: ESOPHAGOGASTRODUODENOSCOPY (EGD) WITH PROPOFOL; N/A     Comment:  Procedure: ESOPHAGOGASTRODUODENOSCOPY (EGD) WITH               PROPOFOL;  Surgeon: Virgel Manifold, MD;  Location:               [facility] ENDOSCOPY;  Service: Endoscopy;  Laterality: N/A; BMI    Body Mass Index: 39.97 kg/m     Reproductive/Obstetrics                            Anesthesia Physical Anesthesia Plan  ASA: IV  Anesthesia Plan: General ETT   Post-op Pain Management:    Induction: Intravenous and Rapid sequence  PONV Risk Score and Plan: 2 and Ondansetron, Treatment may vary due to age or medical condition and Midazolam  Airway Management Planned: Oral ETT  Additional Equipment:   Intra-op Plan:   Post-operative Plan: Extubation in OR  Informed Consent: I have reviewed the patients History and Physical, chart, labs and discussed the procedure including the risks, benefits and alternatives for the proposed anesthesia with the patient or authorized representative who has indicated his/her understanding and acceptance.     Dental Advisory Given  Plan Discussed with: CRNA  Anesthesia Plan Comments: (Obese pt with thick neck, not able to follow commands. Will likely require ETT to perform port placement.)        Anesthesia Quick Evaluation

## 2019-12-12 ENCOUNTER — Telehealth: Payer: Self-pay | Admitting: Internal Medicine

## 2019-12-12 NOTE — Telephone Encounter (Signed)
On 1/15- I Spoke to Dr.Bronstein-regarding patient's IV iron infusions at [facility].  Discussed that patient will likely need maintenance IV iron infusion given the colon malignancy.   #With regards to colon cancer-as per surgery high risk of complications unresectable.  I will discussed with Dr. Lysle Pearl to confirm.  If unresectable-would recommend hospice with progression of disease.  Discussed with Dr. Lovie Macadamia.  #  Will fax order for Venofer/EMLA cream [[phone]] on 1/18.

## 2019-12-12 NOTE — Telephone Encounter (Signed)
Per Dr. Jacinto Reap. MD will speak to Dr. Lovie Macadamia rounding pcp at facility to discuss plan of care for iron infusions at the facility. Dr. B asked that our office hold off faxing orders for emla cream at this time to facility.

## 2019-12-16 ENCOUNTER — Telehealth: Payer: Self-pay | Admitting: Internal Medicine

## 2019-12-16 NOTE — Telephone Encounter (Signed)
On 1/18-I spoke to patient's niece/H POA-as the patient currently [facility]/patient could receive IV iron infusions at [facility].  Discussed with Dr. Lollie Sails faxed.  The frequency of infusions will depend upon iron studies/anemia-defer to [facility].  Also discussed regarding colon cancer-with intact colonic malignancy.  Patient understandably at high risk of surgical complications-given his multiple comorbidities.  Discussed with the niece that if they are interested in pursuing surgical option-patient will need to be seen at [facility] like [facility].  Patient's niece will have to contact Dr. Ines Bloomer office for referral if they are interested in moving forward.  This was discussed with Dr. Lysle Pearl.    Also discussed with patient H POA-that patient is not a candidate for chemotherapy.  If patient's cancer would progress-patient be candidate for hospice.  She will call us if needed with any other questions.  Currently no follow-ups at the cancer center.   FYI-

## 2019-12-17 ENCOUNTER — Inpatient Hospital Stay: Payer: Medicaid Other | Admitting: Internal Medicine

## 2019-12-18 NOTE — Anesthesia Postprocedure Evaluation (Signed)
Anesthesia Post Note  Patient: Jerry [name]  Procedure(s) Performed: INSERTION PORT-A-CATH (Right Chest)  Patient location during evaluation: PACU Anesthesia Type: General Level of consciousness: awake and alert Pain management: pain level controlled Vital Signs Assessment: post-procedure vital signs reviewed and stable Respiratory status: spontaneous breathing, nonlabored ventilation and respiratory function stable Cardiovascular status: blood pressure returned to baseline and stable Postop Assessment: no apparent nausea or vomiting Anesthetic complications: no     Last Vitals:  Vitals:   12/11/19 1458 12/11/19 1530  BP: (!) 114/58 110/82  Pulse: 88   Resp: 18 18  Temp: (!) 35.9 C   SpO2: 95% 95%    Last Pain:  Vitals:   12/12/19 0903  TempSrc:   PainSc: 0-No pain                 Alphonsus Sias

## 2019-12-19 ENCOUNTER — Encounter: Payer: Self-pay | Admitting: *Deleted

## 2019-12-24 ENCOUNTER — Other Ambulatory Visit: Payer: Self-pay

## 2019-12-24 ENCOUNTER — Non-Acute Institutional Stay: Payer: Medicaid Other | Admitting: Primary Care

## 2019-12-31 ENCOUNTER — Other Ambulatory Visit: Payer: Self-pay

## 2019-12-31 ENCOUNTER — Non-Acute Institutional Stay: Payer: Medicaid Other | Admitting: Primary Care

## 2020-01-14 ENCOUNTER — Other Ambulatory Visit: Payer: Self-pay

## 2020-01-14 ENCOUNTER — Non-Acute Institutional Stay: Payer: Medicaid Other | Admitting: Primary Care

## 2020-02-11 ENCOUNTER — Non-Acute Institutional Stay: Payer: Medicaid Other | Admitting: Primary Care

## 2020-02-11 ENCOUNTER — Other Ambulatory Visit: Payer: Self-pay

## 2020-02-11 DIAGNOSIS — Z515 Encounter for palliative care: Secondary | ICD-10-CM

## 2020-02-11 NOTE — Progress Notes (Signed)
[place] Consult Note Telephone: [phone]  Fax: [phone]    PATIENT NAME: Jerry [name] [facility] [address] [place] [address] [phone] (home)  DOB: 1956/03/26 MRN: [id number]  PRIMARY CARE PROVIDER:   [name], MD, [address] [place] [address] [phone]  REFERRING PROVIDER:  [name], MD [address] [place],  [place] [address] [phone]  RESPONSIBLE PARTY:   Extended Emergency Contact Information Primary Emergency Contact: Manual Meier of [place] Phone: [phone] Relation: Legal Guardian Secondary Emergency Contact: Tomasa Rand States of [place] Phone: [phone] Relation: Sister   ASSESSMENT AND RECOMMENDATIONS:   1. Advance Care Planning/Goals of Care: Goals include to maximize quality of life and symptom management. I discussed goals of care with Jerry [name], niece and POA. She has some questions about hospital stay in Dec/January and their discovery of colon cancer. We discussed them having placed a port and would monitor anemia and iron infusions. She states she was never told of stage but that surgery and chemo are not recommended. At her request I explained the results of a CT test whereby the colon mass was noted but no spread had been seen on CT. He would need more work up for a definitive diagnosis. She wants to find out more about the staging and possible chemo, and I directed her to the oncology practice to revisit any questions she has about treatment options. I also asked her to follow up with Dr. Lovie Macadamia if she has questions about the iron infusions per the port that was placed.  2. Symptom Management:   I met with Jerry [name] today in his skilled facility room. He was enjoying his St. Patrick's Day lunch. He is able to feed himself. He continues to have many hallucinations and he asked me to ask his family to visit now that  visiting is open. I will reach out to his niece and relay the message. He had no complaints as far as discomfort or concerns.  Constipation: Appears to have bms every 2-3 days. Recommend to d/c colace and begin senna 1 tab bid for constipation.  3. Family /Caregiver/Community Supports: Niece is POA, has other family in the area. I let them know visitations were starting again and she will call to set this up.  4. Cognitive / Functional decline: Alert, oriented x 1. Hallucinations, psychosis sx. Bedbound and up with lift.  5. Follow up Palliative Care Visit: Palliative care will continue to follow for goals of care clarification and symptom management. Return 4-6 weeks or prn.  I spent 35 minutes providing this consultation,  from 1500 to 1535. More than 50% of the time in this consultation was spent coordinating communication.   HISTORY OF PRESENT ILLNESS:  Jerry [name] is a 64 y.o. year old male with multiple medical problems including Colon neoplasm, morbid obesity, severe rheumatoid arthritis, diastolic CHF, [name], chronic anemia, thrombocytopenia, psychoses. Palliative Care was asked to follow this patient by consultation request of [name], MD to help address advance care planning and goals of care. This is a follow up visit.  CODE STATUS: TBD  PPS: 30% HOSPICE ELIGIBILITY/DIAGNOSIS: TBD  PAST MEDICAL HISTORY:  Past Medical History:  Diagnosis Date  . BPH (benign prostatic hyperplasia)   . CHF (congestive heart failure) ([place])   . Chronic pain   . COPD (chronic obstructive pulmonary disease) ([place])   . Hypertension   . Morbid obesity (  HCC)   . Pressure ulcer   . Rheumatoid arthritis ([place])   . Schizophrenia ([place])   . Thyroid disease     SOCIAL HX:  Social History   Tobacco Use  . Smoking status: Never Smoker  . Smokeless tobacco: Never Used  Substance Use Topics  . Alcohol use: No    ALLERGIES:  Allergies  Allergen Reactions  . Methadone   .  Penicillins     Tolerates cephalosporins   . Propoxyphene   . Sulfa Antibiotics   . Valproic Acid And Related     Thrombocytopenia     PERTINENT MEDICATIONS:  Outpatient Encounter Medications as of 02/11/2020  Medication Sig  . acetaminophen (TYLENOL) 325 MG tablet Take 650 mg by mouth every 4 (four) hours as needed for mild pain or moderate pain.  Marland Kitchen albuterol (PROVENTIL HFA;VENTOLIN HFA) 108 (90 Base) MCG/ACT inhaler Inhale 1 puff into the lungs every 3 (three) hours as needed for wheezing or shortness of breath.   . Amino Acids-Protein Hydrolys (FEEDING SUPPLEMENT, PRO-STAT SUGAR FREE 64,) LIQD Take 30 mLs by mouth 3 (three) times daily with meals. Sugar free  . ammonium lactate (LAC-HYDRIN) 12 % lotion Apply 1 application topically 2 (two) times daily. Apply topically to bilateral feet  . benztropine (COGENTIN) 0.5 MG tablet Take 0.5 mg by mouth 2 (two) times daily.  Marland Kitchen docusate sodium (COLACE) 100 MG capsule Take 100 mg by mouth 2 (two) times daily.  . ferrous sulfate 325 (65 FE) MG tablet Take 1 tablet (325 mg total) by mouth 2 (two) times daily with a meal.  . fluPHENAZine (PROLIXIN) 5 MG tablet Take 3 tablets (15 mg total) by mouth 2 (two) times daily.  . furosemide (LASIX) 20 MG tablet Take 1 tablet (20 mg total) by mouth 2 (two) times daily.  Marland Kitchen guaifenesin (ROBITUSSIN) 100 MG/5ML syrup Take 100 mg by mouth every 4 (four) hours as needed for cough.  . hydrocortisone (CORTEF) 10 MG tablet Take 1 tablet (10 mg total) by mouth 2 (two) times daily.  . hydroxychloroquine (PLAQUENIL) 200 MG tablet Take 200 mg by mouth 2 (two) times daily.  Marland Kitchen levothyroxine (SYNTHROID) 200 MCG tablet Take 200 mcg by mouth daily before breakfast.   . lidocaine-prilocaine (EMLA) cream Apply 1 application topically as needed. To port a cath site - 1 hour prior to infusion  . methocarbamol (ROBAXIN) 750 MG tablet Take 750 mg by mouth at bedtime.  . metoprolol tartrate (LOPRESSOR) 50 MG tablet Take 1 tablet (50 mg  total) by mouth 2 (two) times daily.  . Multiple Vitamins-Minerals (MULTIVITAMIN WITH MINERALS) tablet Take 1 tablet by mouth daily.  . pantoprazole (PROTONIX) 40 MG tablet Take 1 tablet (40 mg total) by mouth daily. (Patient taking differently: Take 40 mg by mouth every morning. )  . Polyethylene Glycol 3350 (MIRALAX PO) Take 17 g by mouth daily.  . potassium chloride (KLOR-CON) 10 MEQ tablet Take 10 mEq by mouth daily.  . tamsulosin (FLOMAX) 0.4 MG CAPS capsule Take 0.4 mg by mouth daily after breakfast.   . traMADol (ULTRAM) 50 MG tablet Take 1 tablet (50 mg total) by mouth every 6 (six) hours as needed for moderate pain or severe pain.  . vitamin C (ASCORBIC ACID) 250 MG tablet Take 500 mg by mouth 2 (two) times daily.    No facility-administered encounter medications on file as of 02/11/2020.    PHYSICAL EXAM / ROS:   Current and past weights: 297 recently, 278 several months ago.  Has some edema. Had been down to 270 lb in mid Feb. This is 40 lb fluctuation from several months ago but he seems to be gaining weight back.  General: NAD, frail appearing, morbid obesity Cardiovascular: no chest pain reported, 2-3+ LE edema Pulmonary: no cough, no increased SOB, oxygen Abdomen: appetite good, eats 100%, endorses constipation, incontinent of bowel GU: denies dysuria, incontinent of urine MSK:  + deformities, non ambulatory Skin: no rashes or wounds reported Neurological: Weakness, psychosis, hallucinations  Jason Coop, NP [facility]  COVID-19 PATIENT SCREENING TOOL  Person answering questions: ____________Staff_______ _____   1.  Is the patient or any family member in the home showing any signs or symptoms regarding respiratory infection?               Person with Symptom- __________NA_________________  a. Fever                                                                          Yes___ No___          ___________________  b. Shortness of breath                                                     Yes___ No___          ___________________ c. Cough/congestion                                       Yes___  No___         ___________________ d. Body aches/pains                                                         Yes___ No___        ____________________ e. Gastrointestinal symptoms (diarrhea, nausea)           Yes___ No___        ____________________  2. Within the past 14 days, has anyone living in the home had any contact with someone with or under investigation for COVID-19?    Yes___ No_X_   Person __________________

## 2020-03-17 ENCOUNTER — Non-Acute Institutional Stay: Payer: Medicaid Other | Admitting: Primary Care

## 2020-03-17 ENCOUNTER — Other Ambulatory Visit: Payer: Self-pay

## 2020-03-17 DIAGNOSIS — Z515 Encounter for palliative care: Secondary | ICD-10-CM

## 2020-03-17 NOTE — Progress Notes (Signed)
[place] Consult Note Telephone: [phone]  Fax: [phone]    PATIENT NAME: Jerry [name] [facility] [address] [place] [address] [phone] (home)  DOB: [birth year] MRN: [id number]  PRIMARY CARE PROVIDER:   [name], MD, [address] [place] [address] [phone]  REFERRING PROVIDER:  [name], MD [address] [place],  [place] [address] [phone]  RESPONSIBLE PARTY:   Extended Emergency Contact Information Primary Emergency Contact: Manual Meier of [place] Phone: [phone] Relation: Legal Guardian Secondary Emergency Contact: Tomasa Rand [place] of [place] Phone: [phone] Relation: Sister   I met with patient  in  facility.  ASSESSMENT AND RECOMMENDATIONS:   1. Advance Care Planning/Goals of Care: Goals include to maximize quality of life and symptom management.Tc to POA Niece Ason Heslin. No answer, message left. Requested [facility] of SNF for upload.  2. Symptom Management:  I met with Jerry [name]. He was resting in his bed after breakfast. He was calm and did not voice and he does comfort. His schizophrenia does not allow for purposeful conversation. He has on several occasions and talked about getting a house for him and his family which he does again today. His weight is remaining stable it with slight increases.Asks for water and Koolaid. Asked for SW to discuss moving to a house.   3. Family /Caregiver/Community Supports: [name] is POA, lives in [place].  4. Cognitive / Functional decline: Alert, oriented x 1 . Needs help with many adls.   5. Follow up Palliative Care Visit: Palliative care will continue to follow for goals of care clarification and symptom management. Return 8-10 weeks or prn.  I spent 25 minutes providing this consultation,  from 1100 to 1125. More than 50% of the time in this consultation was spent coordinating communication.    HISTORY OF PRESENT ILLNESS:  Jerry [name] is a 64 y.o. year old male with multiple medical problems including Colon neoplasm,morbid obesity, severe rheumatoid arthritis, diastolic CHF, [name], chronic anemia, thrombocytopenia, psychoses. Palliative Care was asked to follow this patient by consultation request of [name], MD to help address advance care planning and goals of care. This is a follow up visit.  CODE STATUS: TBD  PPS: 30% HOSPICE ELIGIBILITY/DIAGNOSIS: TBD  PAST MEDICAL HISTORY:  Past Medical History:  Diagnosis Date  . BPH (benign prostatic hyperplasia)   . CHF (congestive heart failure) ([place])   . Chronic pain   . COPD (chronic obstructive pulmonary disease) ([place])   . Hypertension   . Morbid obesity ([place])   . Pressure ulcer   . Rheumatoid arthritis ([place])   . Schizophrenia ([place])   . Thyroid disease     SOCIAL HX:  Social History   Tobacco Use  . Smoking status: Never Smoker  . Smokeless tobacco: Never Used  Substance Use Topics  . Alcohol use: No    ALLERGIES:  Allergies  Allergen Reactions  . Methadone   . Penicillins     Tolerates cephalosporins   . Propoxyphene   . Sulfa Antibiotics   . Valproic Acid And Related     Thrombocytopenia     PERTINENT MEDICATIONS:  Outpatient Encounter Medications as of 03/17/2020  Medication Sig  . acetaminophen (TYLENOL) 325 MG tablet Take 650 mg by mouth every 4 (four) hours as needed for mild pain or moderate pain.  Marland Kitchen albuterol (PROVENTIL HFA;VENTOLIN HFA) 108 (90 Base) MCG/ACT inhaler Inhale 1 puff into  the lungs every 3 (three) hours as needed for wheezing or shortness of breath.   . Amino Acids-Protein Hydrolys (FEEDING SUPPLEMENT, PRO-STAT SUGAR FREE 64,) LIQD Take 30 mLs by mouth 3 (three) times daily with meals. Sugar free  . ammonium lactate (LAC-HYDRIN) 12 % lotion Apply 1 application topically 2 (two) times daily. Apply topically to bilateral feet  . benztropine (COGENTIN) 0.5 MG tablet  Take 0.5 mg by mouth 2 (two) times daily.  Marland Kitchen docusate sodium (COLACE) 100 MG capsule Take 100 mg by mouth 2 (two) times daily.  . ferrous sulfate 325 (65 FE) MG tablet Take 1 tablet (325 mg total) by mouth 2 (two) times daily with a meal.  . fluPHENAZine (PROLIXIN) 5 MG tablet Take 3 tablets (15 mg total) by mouth 2 (two) times daily.  . furosemide (LASIX) 20 MG tablet Take 1 tablet (20 mg total) by mouth 2 (two) times daily.  Marland Kitchen guaifenesin (ROBITUSSIN) 100 MG/5ML syrup Take 100 mg by mouth every 4 (four) hours as needed for cough.  . hydrocortisone (CORTEF) 10 MG tablet Take 1 tablet (10 mg total) by mouth 2 (two) times daily.  . hydroxychloroquine (PLAQUENIL) 200 MG tablet Take 200 mg by mouth 2 (two) times daily.  Marland Kitchen levothyroxine (SYNTHROID) 200 MCG tablet Take 200 mcg by mouth daily before breakfast.   . lidocaine-prilocaine (EMLA) cream Apply 1 application topically as needed. To port a cath site - 1 hour prior to infusion  . methocarbamol (ROBAXIN) 750 MG tablet Take 750 mg by mouth at bedtime.  . metoprolol tartrate (LOPRESSOR) 50 MG tablet Take 1 tablet (50 mg total) by mouth 2 (two) times daily.  . Multiple Vitamins-Minerals (MULTIVITAMIN WITH MINERALS) tablet Take 1 tablet by mouth daily.  . pantoprazole (PROTONIX) 40 MG tablet Take 1 tablet (40 mg total) by mouth daily. (Patient taking differently: Take 40 mg by mouth every morning. )  . Polyethylene Glycol 3350 (MIRALAX PO) Take 17 g by mouth daily.  . potassium chloride (KLOR-CON) 10 MEQ tablet Take 10 mEq by mouth daily.  . tamsulosin (FLOMAX) 0.4 MG CAPS capsule Take 0.4 mg by mouth daily after breakfast.   . traMADol (ULTRAM) 50 MG tablet Take 1 tablet (50 mg total) by mouth every 6 (six) hours as needed for moderate pain or severe pain.  . vitamin C (ASCORBIC ACID) 250 MG tablet Take 500 mg by mouth 2 (two) times daily.    No facility-administered encounter medications on file as of 03/17/2020.    PHYSICAL EXAM / ROS:    Current and past weights: 301 lbs, slight gain General: NAD, frail appearing, obese Cardiovascular: no chest pain reported, no edema in LE Pulmonary: no cough, no increased SOB, oxygen use, 2 l Abdomen: appetite fair, endorses constipation, incontinent of bowel GU: denies dysuria, incontinent of urine MSK:  + joint deformities of hands, non ambulatory, oob in w/c Skin: no rashes or wounds reported Neurological: Weakness,deficits due to psych dx.  Jason Coop, NP [facility]  COVID-19 PATIENT SCREENING TOOL  Person answering questions: ____________Staff_______ _____   1.  Is the patient or any family member in the home showing any signs or symptoms regarding respiratory infection?               Person with Symptom- __________NA_________________  a. Fever  Yes___ No___          ___________________  b. Shortness of breath                                                    Yes___ No___          ___________________ c. Cough/congestion                                       Yes___  No___         ___________________ d. Body aches/pains                                                         Yes___ No___        ____________________ e. Gastrointestinal symptoms (diarrhea, nausea)           Yes___ No___        ____________________  2. Within the past 14 days, has anyone living in the home had any contact with someone with or under investigation for COVID-19?    Yes___ No_X_   Person __________________

## 2020-05-06 ENCOUNTER — Non-Acute Institutional Stay: Payer: Medicaid Other | Admitting: Primary Care

## 2020-05-06 ENCOUNTER — Other Ambulatory Visit: Payer: Self-pay

## 2020-05-06 DIAGNOSIS — E66813 Obesity, class 3: Secondary | ICD-10-CM

## 2020-05-06 DIAGNOSIS — Z515 Encounter for palliative care: Secondary | ICD-10-CM

## 2020-05-06 DIAGNOSIS — I509 Heart failure, unspecified: Secondary | ICD-10-CM

## 2020-05-06 DIAGNOSIS — Z66 Do not resuscitate: Secondary | ICD-10-CM

## 2020-05-06 NOTE — Progress Notes (Signed)
[facility] Consult Note Telephone: [phone]  Fax: [phone]  PATIENT NAME: Jerry [name] [facility] [address] [place] [address] [phone] (home)  DOB: 06-18-1956 MRN: [id number]  PRIMARY CARE PROVIDER:    [name], MD,  [address] [place] [address] [phone]  REFERRING PROVIDER:   [name], MD [address] [place],  [place] [address] [phone]  RESPONSIBLE PARTY:   Extended Emergency Contact Information Primary Emergency Contact: Jerry [name] of [place] Phone: [phone] Relation: Legal Guardian Secondary Emergency Contact: Jerry [name] States of [place] Phone: [phone] Relation: Sister  I met with patient in the facility.  ASSESSMENT AND RECOMMENDATIONS:   1. Advance Care Planning/Goals of Care: Goals include to maximize quality of life and symptom management. I called POA Jerry [name] and spoke on the phone. Our advance care planning conversation included a discussion about:     The value and importance of advance care planning   Exploration of goals of care in the event of a sudden injury or illness   Review  of an  advance directive document .  She asked to speak at at later date next week, so appt made for  acp appt on 05/13/20  2. Symptom Management:  Patient denies discomfort, conversation but flight of ideas. Appears calm and comfortable. Asked for many items from the dollar store.   3. Family /Caregiver/Community Supports: Niece is POA, Lives in [place].  4. Cognitive / Functional decline:  A and O x 1, cognitive impairment, dependent in all adls, iadls.  5. Follow up Palliative Care Visit: Palliative care will continue to follow for goals of care clarification and symptom management. Return 1 weeks or prn.  I spent 25 minutes providing this consultation,  from 1400 to 1425. More than 50% of the time in this consultation was spent  coordinating communication.   HISTORY OF PRESENT ILLNESS:  Jerry [name] is a 64 y.o. year old male with multiple medical problems including schizophrenia, copd, colon mass. Palliative Care was asked to follow this patient by consultation request of Jerry Pitch, MD to help address advance care planning and goals of care. This is a follow up visit.  CODE STATUS:   PPS: 30%  HOSPICE ELIGIBILITY/DIAGNOSIS: TBD  PAST MEDICAL HISTORY:  Past Medical History:  Diagnosis Date   BPH (benign prostatic hyperplasia)    CHF (congestive heart failure) (HCC)    Chronic pain    COPD (chronic obstructive pulmonary disease) (HCC)    Hypertension    Morbid obesity (HCC)    Pressure ulcer    Rheumatoid arthritis ([place])    Schizophrenia ([place])    Thyroid disease     SOCIAL HX:  Social History   Tobacco Use   Smoking status: Never Smoker   Smokeless tobacco: Never Used  Substance Use Topics   Alcohol use: No    ALLERGIES:  Allergies  Allergen Reactions   Methadone    Penicillins     Tolerates cephalosporins    Propoxyphene    Sulfa Antibiotics    Valproic Acid And Related     Thrombocytopenia     PERTINENT MEDICATIONS:  Outpatient Encounter Medications as of 05/06/2020  Medication Sig   acetaminophen (TYLENOL) 325 MG tablet Take 650 mg by mouth every 4 (four) hours as needed for mild pain or moderate pain.   albuterol (PROVENTIL HFA;VENTOLIN HFA) 108 (90 Base) MCG/ACT inhaler Inhale 1 puff into the lungs every  3 (three) hours as needed for wheezing or shortness of breath.    Amino Acids-Protein Hydrolys (FEEDING SUPPLEMENT, PRO-STAT SUGAR FREE 64,) LIQD Take 30 mLs by mouth 3 (three) times daily with meals. Sugar free   ammonium lactate (LAC-HYDRIN) 12 % lotion Apply 1 application topically 2 (two) times daily. Apply topically to bilateral feet   benztropine (COGENTIN) 0.5 MG tablet Take 0.5 mg by mouth 2 (two) times daily.   docusate sodium (COLACE) 100 MG  capsule Take 100 mg by mouth 2 (two) times daily.   ferrous sulfate 325 (65 FE) MG tablet Take 1 tablet (325 mg total) by mouth 2 (two) times daily with a meal.   fluPHENAZine (PROLIXIN) 5 MG tablet Take 3 tablets (15 mg total) by mouth 2 (two) times daily.   furosemide (LASIX) 20 MG tablet Take 1 tablet (20 mg total) by mouth 2 (two) times daily.   guaifenesin (ROBITUSSIN) 100 MG/5ML syrup Take 100 mg by mouth every 4 (four) hours as needed for cough.   hydrocortisone (CORTEF) 10 MG tablet Take 1 tablet (10 mg total) by mouth 2 (two) times daily.   hydroxychloroquine (PLAQUENIL) 200 MG tablet Take 200 mg by mouth 2 (two) times daily.   levothyroxine (SYNTHROID) 200 MCG tablet Take 200 mcg by mouth daily before breakfast.    lidocaine-prilocaine (EMLA) cream Apply 1 application topically as needed. To port a cath site - 1 hour prior to infusion   methocarbamol (ROBAXIN) 750 MG tablet Take 750 mg by mouth at bedtime.   metoprolol tartrate (LOPRESSOR) 50 MG tablet Take 1 tablet (50 mg total) by mouth 2 (two) times daily.   Multiple Vitamins-Minerals (MULTIVITAMIN WITH MINERALS) tablet Take 1 tablet by mouth daily.   pantoprazole (PROTONIX) 40 MG tablet Take 1 tablet (40 mg total) by mouth daily. (Patient taking differently: Take 40 mg by mouth every morning. )   Polyethylene Glycol 3350 (MIRALAX PO) Take 17 g by mouth daily.   potassium chloride (KLOR-CON) 10 MEQ tablet Take 10 mEq by mouth daily.   tamsulosin (FLOMAX) 0.4 MG CAPS capsule Take 0.4 mg by mouth daily after breakfast.    traMADol (ULTRAM) 50 MG tablet Take 1 tablet (50 mg total) by mouth every 6 (six) hours as needed for moderate pain or severe pain.   vitamin C (ASCORBIC ACID) 250 MG tablet Take 500 mg by mouth 2 (two) times daily.    No facility-administered encounter medications on file as of 05/06/2020.    PHYSICAL EXAM / ROS:   Current and past weights: 282 lbs General: NAD, frail appearing,  obese Cardiovascular: no chest pain reported, no edema  Pulmonary: no cough, no increased SOB, oxygen at 2-3 l Abdomen: appetite good,  incontinent of bowel GU: denies dysuria, incontinent of urine MSK:  + joint and ROM abnormalities, non ambulatory Skin: no rashes or wounds reported Neurological: Weakness,cognitive impairment  Jason Coop, NP [facility]  COVID-19 PATIENT SCREENING TOOL  Person answering questions: ____________Staff_______ _____   1.  Is the patient or any family member in the home showing any signs or symptoms regarding respiratory infection?               Person with Symptom- __________NA_________________  a. Fever  Yes___ No___          ___________________  b. Shortness of breath                                                    Yes___ No___          ___________________ c. Cough/congestion                                       Yes___  No___         ___________________ d. Body aches/pains                                                         Yes___ No___        ____________________ e. Gastrointestinal symptoms (diarrhea, nausea)           Yes___ No___        ____________________  2. Within the past 14 days, has anyone living in the home had any contact with someone with or under investigation for COVID-19?    Yes___ No_X_   Person __________________

## 2020-05-07 ENCOUNTER — Non-Acute Institutional Stay: Payer: Medicaid Other | Admitting: Primary Care

## 2020-05-13 ENCOUNTER — Non-Acute Institutional Stay: Payer: Medicaid Other | Admitting: Primary Care

## 2020-05-16 IMAGING — DX DG CHEST 1V PORT
1 series · 1 of 1 positions shown · non-contrast
Comparison: 09/11/2019

CLINICAL DATA: Increased short of breath

EXAM:
PORTABLE CHEST 1 VIEW

[chest ap]
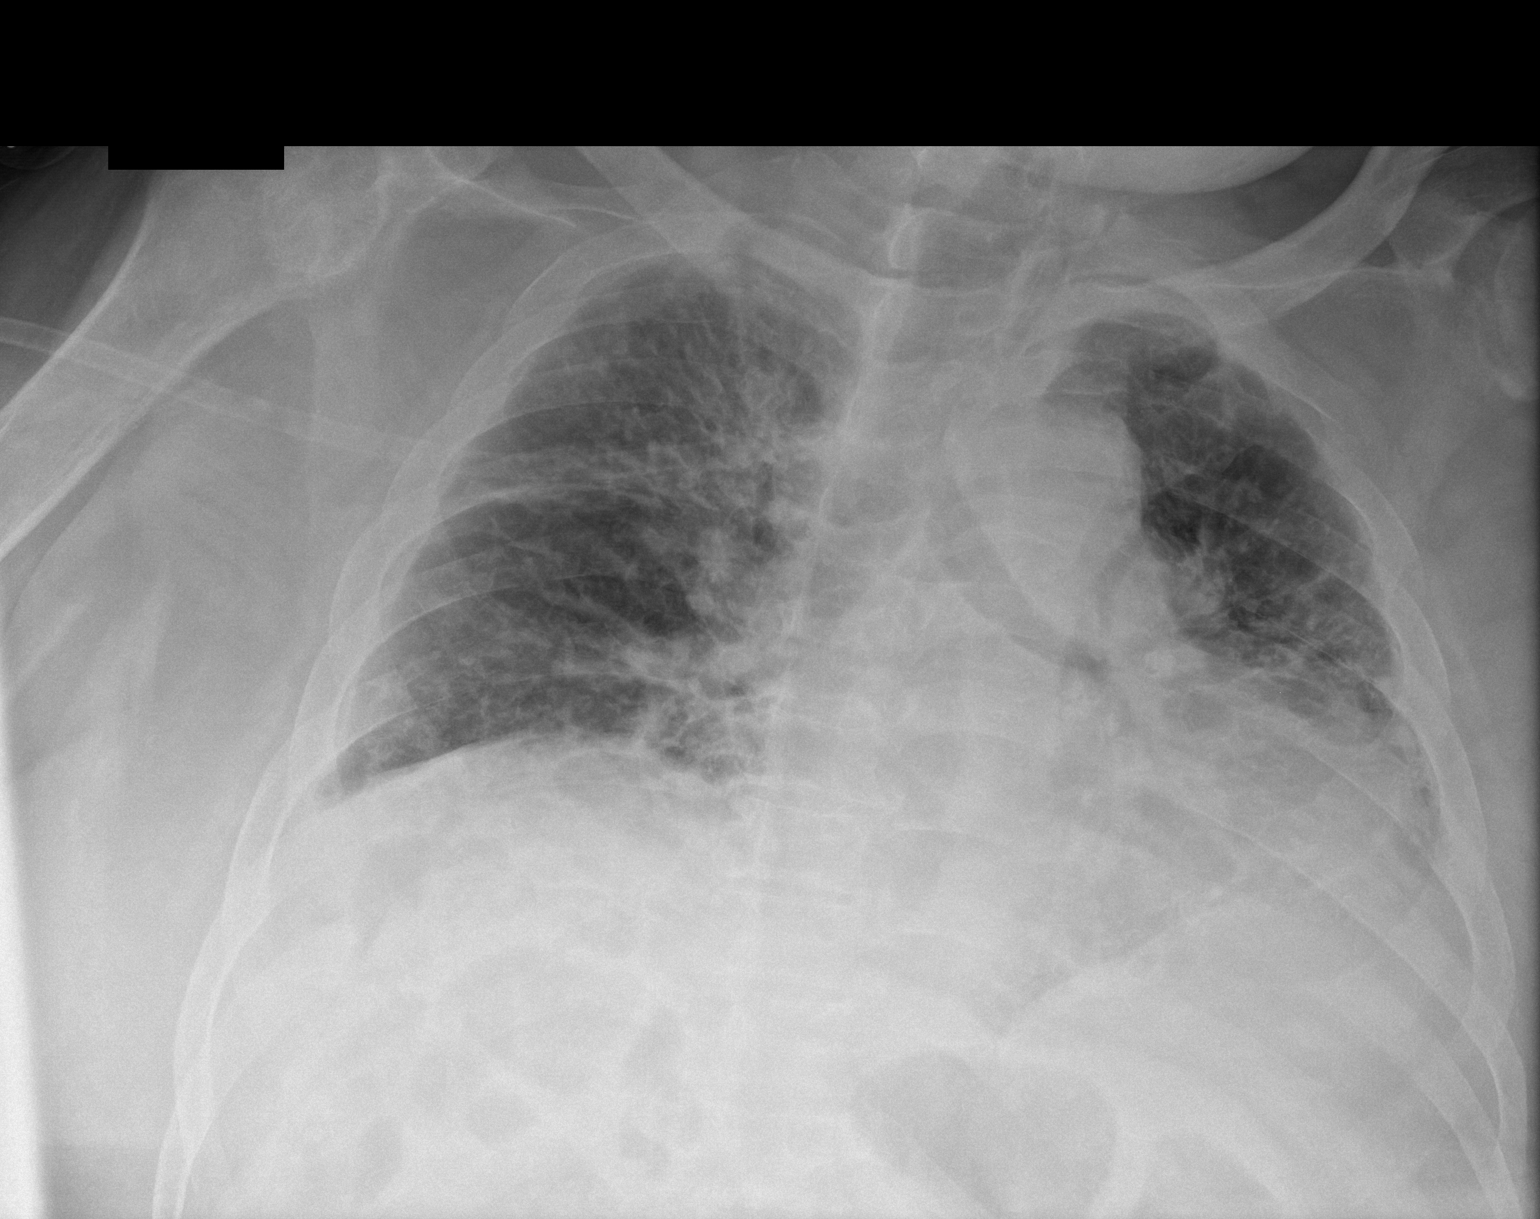

[1 of 1 positions shown; findings below may reference images not displayed]

FINDINGS: Improvement in diffuse bilateral airspace disease. Probable baseline
scarring in the bases similar to 12/27/2017. No pleural effusion.

Right upper lobe airspace disease is more prominent compared with
5039 and could represent pneumonia.
IMPRESSION: Prominent lung markings at the bases appear chronic.

Possible right upper pneumonia.

## 2020-05-18 IMAGING — DX DG CHEST 1V PORT
2 series · 2 of 2 positions shown · non-contrast
Comparison: November 10, 2019

CLINICAL DATA: Pneumonia.

EXAM:
PORTABLE CHEST 1 VIEW

[chest ap (1 of 2)]
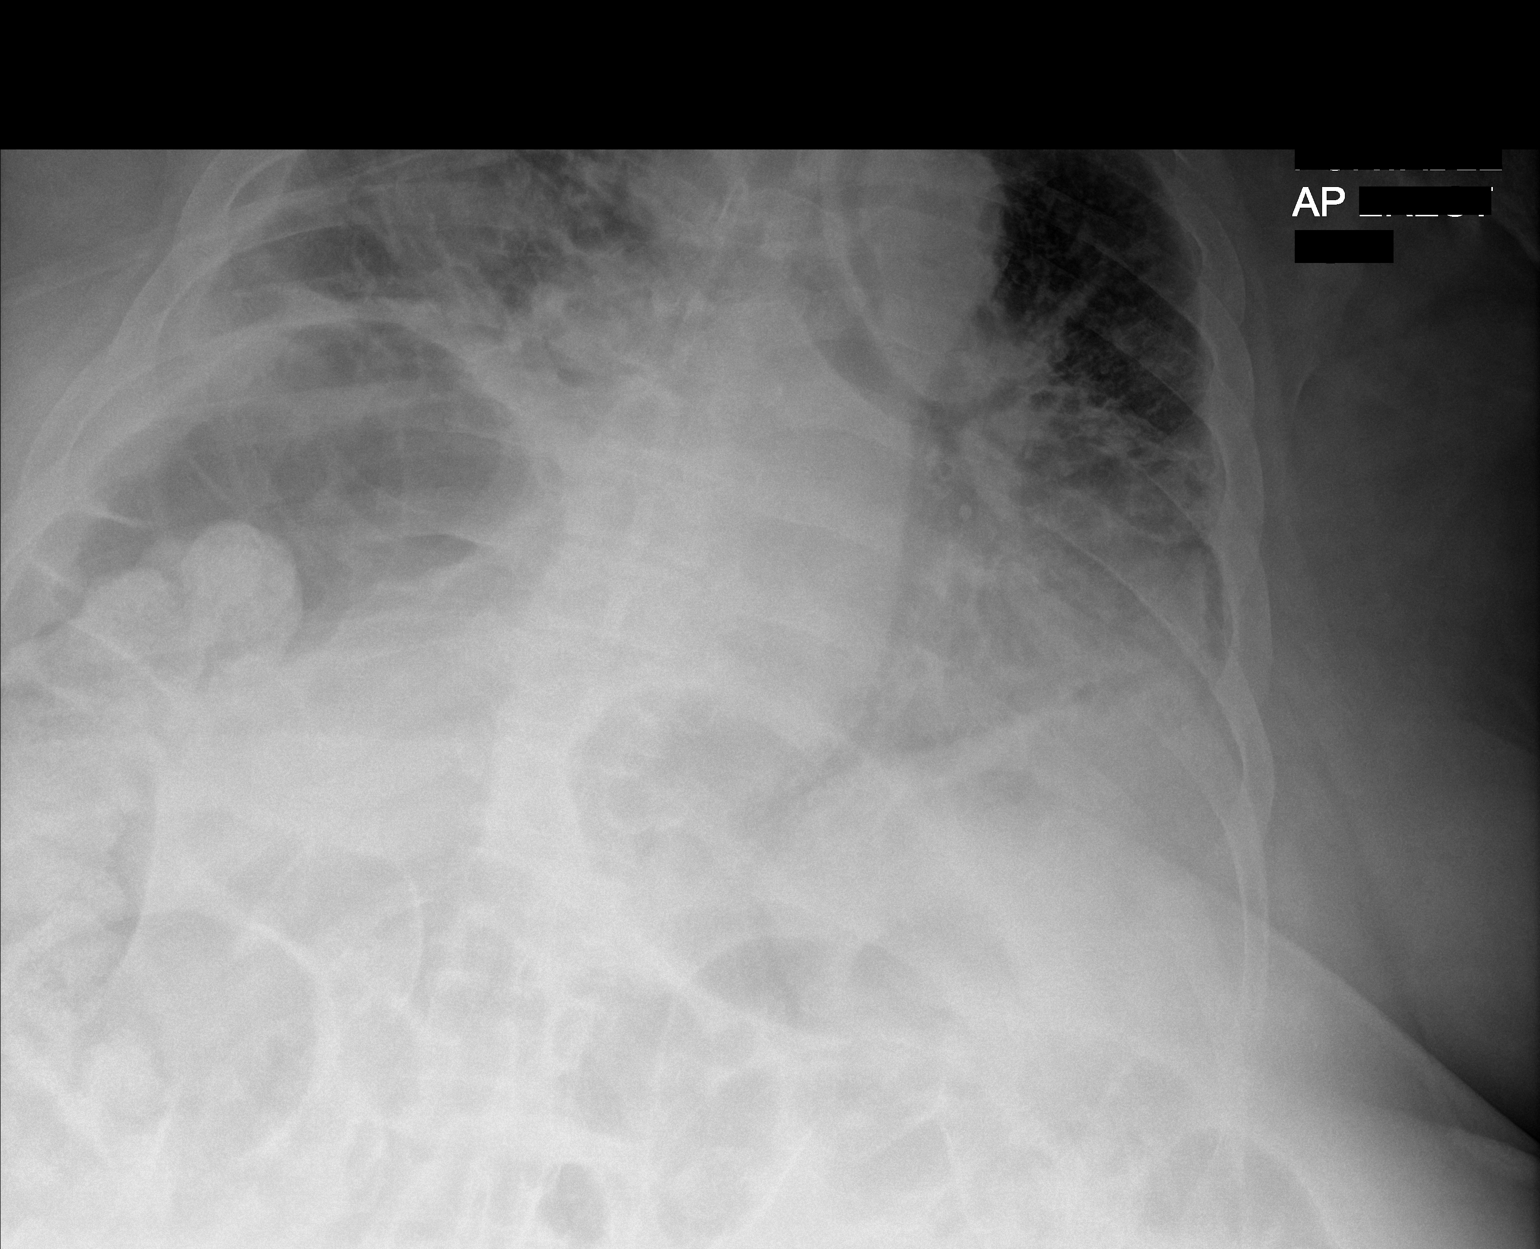

[chest ap (2 of 2)]
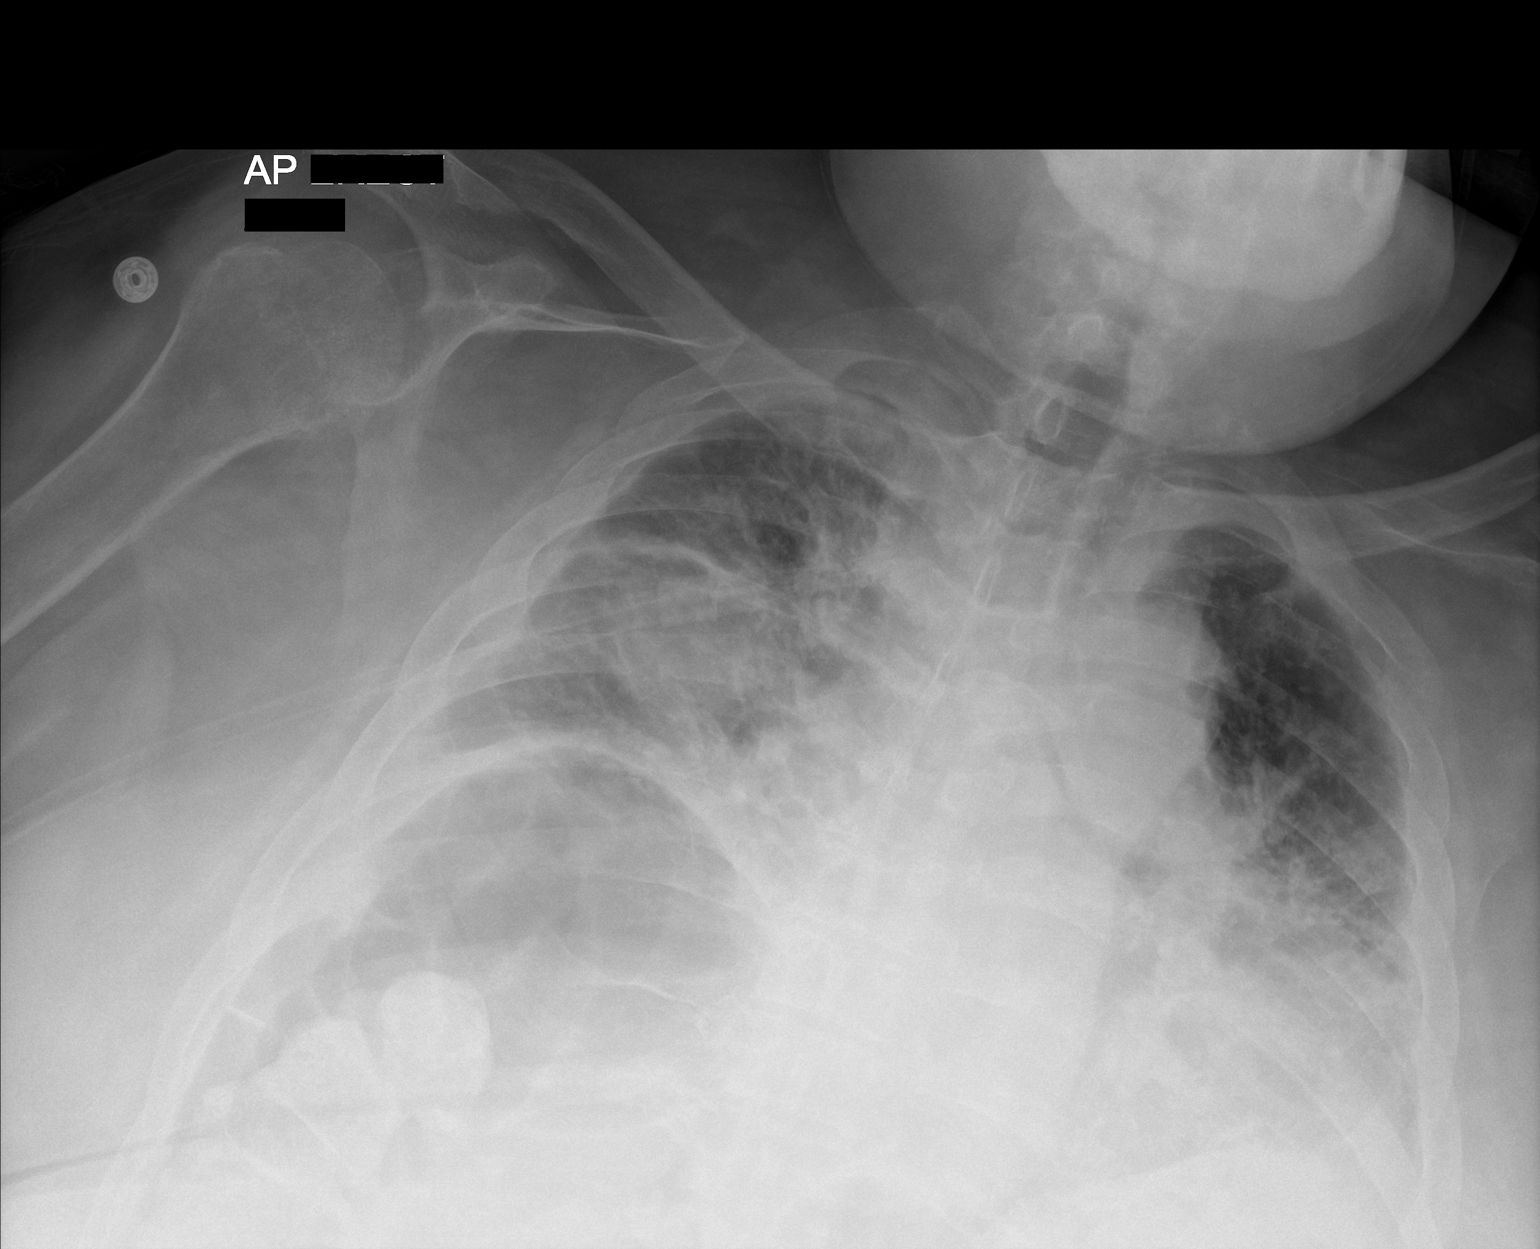

[2 of 2 positions shown; findings below may reference images not displayed]

FINDINGS: Bilateral pulmonary infiltrates, most focal in the bases, have
significantly worsened in the interval. The infiltrate is somewhat
rounded in the right base. The cardiomediastinal silhouette is
stable. No pneumothorax. No other acute abnormalities.
IMPRESSION: 1. Bibasilar infiltrates have significantly worsened in the
interval. There is a rounded somewhat masslike component to the
medial right basilar infiltrate. Recommend attention on follow-up.
Alternatively, CT imaging could better evaluate.

## 2020-05-26 ENCOUNTER — Non-Acute Institutional Stay: Payer: Medicaid Other | Admitting: Primary Care

## 2020-05-26 ENCOUNTER — Other Ambulatory Visit: Payer: Self-pay

## 2020-05-26 DIAGNOSIS — I5032 Chronic diastolic (congestive) heart failure: Secondary | ICD-10-CM

## 2020-05-26 DIAGNOSIS — Z515 Encounter for palliative care: Secondary | ICD-10-CM

## 2020-05-26 NOTE — Progress Notes (Signed)
[name] Consult Note Telephone: [phone]  Fax: [phone]  PATIENT NAME: Jerry [name] [facility] [address] [place] [address] [phone] (home)  DOB: 10/19/1956 MRN: [id number]  PRIMARY CARE PROVIDER:    [name], MD,  [address] [place] [address] [phone]  REFERRING PROVIDER:   [name], MD [address] [place],  [place] [address] [phone]  RESPONSIBLE PARTY:   Extended Emergency Contact Information Primary Emergency Contact: Manual Meier of [place] Phone: [phone] Relation: Legal Guardian Secondary Emergency Contact: Tomasa Rand States of [place] Phone: [phone] Relation: Sister  I met with patient in the facility.  ASSESSMENT AND RECOMMENDATIONS:   1. Advance Care Planning/Goals of Care: Goals include to maximize quality of life and symptom management. .I called his [place]. Staff at snf had sent MOST to her for completion. Previous MOST needed updating. She states she received it and that shes in the middle of moving so she will send it back in the next few days. I asked if she had any questions about any of the advance care directive. She stated that she did not at this time but would call me and took my number in case she did have questions.  2. Symptom Management:   Debility: Recent labs show hemoglobin low but stable. His weight continues to decrease, 1 to 2 pounds since I last checked two weeks ago three weeks ago. He is currently on weekly weights. It does appear to have any discomfort or pain that needs to be managed.  Nutrition: Recent labs show hemoglobin low but stable. His weight continues to decrease, 1 to 2 pounds since I last checked two weeks ago three weeks ago. He is currently on weekly weights.Recommend dietary consultation to monitor weight loss.  Colon  Mass: was identified in patient in  Dec via colonoscopy. CT  with contrast did not find this mass. His hgb and hct appear to be stable but low, 8-9  hgb currently. Continue to monitor for s/sx wt loss, dropping hct, anorexia. Continue weights weekly.  Pain:  Endorses ongoing arthritic pain in joints. Recommend ATC Acetaminophen CR (Arthritis)  650 mg  Po q 8 h.  3. Family /Caregiver/Community Supports: Niece Scarlette Ar is POA. Resident of [facility].  4. Cognitive / Functional decline: Cognition at [place]. Appears for fatigued today and needs help with most adls and all iadls.   5. Follow up Palliative Care Visit: Palliative care will continue to follow for goals of care clarification and symptom management. Return 8 weeks or prn.  I spent 25 minutes providing this consultation,  from 1230 to 1255. More than 50% of the time in this consultation was spent coordinating communication.   HISTORY OF PRESENT ILLNESS:  Jerry [name] is a 64 y.o. year old male with multiple medical problems including COPD, colon mass, weight loss, unintentional. Palliative Care was asked to follow this patient by consultation request of [name], MD to help address advance care planning and goals of care. This is a follow up visit.  CODE STATUS: TBD, MOST out to POA  PPS: 40%  HOSPICE ELIGIBILITY/DIAGNOSIS: no  PAST MEDICAL HISTORY:  Past Medical History:  Diagnosis Date   BPH (benign prostatic hyperplasia)    CHF (congestive heart failure) (HCC)    Chronic pain    COPD (chronic obstructive pulmonary disease) (HCC)    Hypertension    Morbid obesity (HCC)    Pressure ulcer  Rheumatoid arthritis ([place])    Schizophrenia ([place])    Thyroid disease     SOCIAL HX:  Social History   Tobacco Use   Smoking status: Never Smoker   Smokeless tobacco: Never Used  Substance Use Topics   Alcohol use: No    ALLERGIES:  Allergies  Allergen Reactions   Methadone    Penicillins     Tolerates cephalosporins    Propoxyphene    Sulfa Antibiotics    Valproic  Acid And Related     Thrombocytopenia     PERTINENT MEDICATIONS:  Outpatient Encounter Medications as of 05/26/2020  Medication Sig   acetaminophen (TYLENOL) 325 MG tablet Take 650 mg by mouth every 4 (four) hours as needed for mild pain or moderate pain.   albuterol (PROVENTIL HFA;VENTOLIN HFA) 108 (90 Base) MCG/ACT inhaler Inhale 1 puff into the lungs every 3 (three) hours as needed for wheezing or shortness of breath.    Amino Acids-Protein Hydrolys (FEEDING SUPPLEMENT, PRO-STAT SUGAR FREE 64,) LIQD Take 30 mLs by mouth 3 (three) times daily with meals. Sugar free   ammonium lactate (LAC-HYDRIN) 12 % lotion Apply 1 application topically 2 (two) times daily. Apply topically to bilateral feet   benztropine (COGENTIN) 0.5 MG tablet Take 0.5 mg by mouth 2 (two) times daily.   docusate sodium (COLACE) 100 MG capsule Take 100 mg by mouth 2 (two) times daily.   ferrous sulfate 325 (65 FE) MG tablet Take 1 tablet (325 mg total) by mouth 2 (two) times daily with a meal.   fluPHENAZine (PROLIXIN) 5 MG tablet Take 3 tablets (15 mg total) by mouth 2 (two) times daily.   furosemide (LASIX) 20 MG tablet Take 1 tablet (20 mg total) by mouth 2 (two) times daily.   guaifenesin (ROBITUSSIN) 100 MG/5ML syrup Take 100 mg by mouth every 4 (four) hours as needed for cough.   hydrocortisone (CORTEF) 10 MG tablet Take 1 tablet (10 mg total) by mouth 2 (two) times daily.   hydroxychloroquine (PLAQUENIL) 200 MG tablet Take 200 mg by mouth 2 (two) times daily.   levothyroxine (SYNTHROID) 200 MCG tablet Take 200 mcg by mouth daily before breakfast.    lidocaine-prilocaine (EMLA) cream Apply 1 application topically as needed. To port a cath site - 1 hour prior to infusion   methocarbamol (ROBAXIN) 750 MG tablet Take 750 mg by mouth at bedtime.   metoprolol tartrate (LOPRESSOR) 50 MG tablet Take 1 tablet (50 mg total) by mouth 2 (two) times daily.   Multiple Vitamins-Minerals (MULTIVITAMIN WITH MINERALS)  tablet Take 1 tablet by mouth daily.   pantoprazole (PROTONIX) 40 MG tablet Take 1 tablet (40 mg total) by mouth daily. (Patient taking differently: Take 40 mg by mouth every morning. )   Polyethylene Glycol 3350 (MIRALAX PO) Take 17 g by mouth daily.   potassium chloride (KLOR-CON) 10 MEQ tablet Take 10 mEq by mouth daily.   tamsulosin (FLOMAX) 0.4 MG CAPS capsule Take 0.4 mg by mouth daily after breakfast.    traMADol (ULTRAM) 50 MG tablet Take 1 tablet (50 mg total) by mouth every 6 (six) hours as needed for moderate pain or severe pain.   vitamin C (ASCORBIC ACID) 250 MG tablet Take 500 mg by mouth 2 (two) times daily.    No facility-administered encounter medications on file as of 05/26/2020.    PHYSICAL EXAM / ROS:   Current and past weights: Decreasing, now 283 lbs. Weekly weights General: NAD, frail appearing, obese Cardiovascular: S1S2, RRR, no  chest pain reported, no edema  Pulmonary: Lungs clear all lobes per auscultation, no cough, no increased SOB, oxygen at 2 L [place]. Abdomen: appetite good, denies  constipation, incontinent of bowel GU: denies dysuria, incontinent of urine MSK:  ++ joint and ROM abnormalities, non ambulatory, denies falls Skin: no rashes or wounds reported or seen on gross exam Neurological: Weakness, schizophrenia, endorses pain.  Jason Coop, NP [facility]  COVID-19 PATIENT SCREENING TOOL  Person answering questions: ____________Staff_______ _____   1.  Is the patient or any family member in the home showing any signs or symptoms regarding respiratory infection?               Person with Symptom- __________NA_________________  a. Fever                                                                          Yes___ No___          ___________________  b. Shortness of breath                                                    Yes___ No___          ___________________ c. Cough/congestion                                       Yes___  No___          ___________________ d. Body aches/pains                                                         Yes___ No___        ____________________ e. Gastrointestinal symptoms (diarrhea, nausea)           Yes___ No___        ____________________  2. Within the past 14 days, has anyone living in the home had any contact with someone with or under investigation for COVID-19?    Yes___ No_X_   Person __________________

## 2020-06-15 ENCOUNTER — Encounter: Payer: Self-pay | Admitting: Emergency Medicine

## 2020-06-15 ENCOUNTER — Emergency Department: Payer: Medicaid Other

## 2020-06-15 ENCOUNTER — Inpatient Hospital Stay
Admission: EM | Admit: 2020-06-15 | Discharge: 2020-06-28 | DRG: 871 | Disposition: A | Discharge status: Hospice | Payer: Medicaid Other | Source: Skilled Nursing Facility | Attending: Internal Medicine | Admitting: Internal Medicine

## 2020-06-15 DIAGNOSIS — R112 Nausea with vomiting, unspecified: Secondary | ICD-10-CM | POA: Diagnosis not present

## 2020-06-15 DIAGNOSIS — Z882 Allergy status to sulfonamides status: Secondary | ICD-10-CM

## 2020-06-15 DIAGNOSIS — C182 Malignant neoplasm of ascending colon: Secondary | ICD-10-CM | POA: Diagnosis present

## 2020-06-15 DIAGNOSIS — Z6841 Body Mass Index (BMI) 40.0 and over, adult: Secondary | ICD-10-CM | POA: Diagnosis not present

## 2020-06-15 DIAGNOSIS — M069 Rheumatoid arthritis, unspecified: Secondary | ICD-10-CM | POA: Diagnosis present

## 2020-06-15 DIAGNOSIS — C19 Malignant neoplasm of rectosigmoid junction: Secondary | ICD-10-CM

## 2020-06-15 DIAGNOSIS — Z79899 Other long term (current) drug therapy: Secondary | ICD-10-CM

## 2020-06-15 DIAGNOSIS — K6389 Other specified diseases of intestine: Secondary | ICD-10-CM

## 2020-06-15 DIAGNOSIS — N401 Enlarged prostate with lower urinary tract symptoms: Secondary | ICD-10-CM | POA: Diagnosis present

## 2020-06-15 DIAGNOSIS — Z515 Encounter for palliative care: Secondary | ICD-10-CM | POA: Diagnosis not present

## 2020-06-15 DIAGNOSIS — I509 Heart failure, unspecified: Secondary | ICD-10-CM | POA: Diagnosis present

## 2020-06-15 DIAGNOSIS — R1310 Dysphagia, unspecified: Secondary | ICD-10-CM | POA: Diagnosis present

## 2020-06-15 DIAGNOSIS — Z66 Do not resuscitate: Secondary | ICD-10-CM | POA: Diagnosis present

## 2020-06-15 DIAGNOSIS — N390 Urinary tract infection, site not specified: Secondary | ICD-10-CM | POA: Diagnosis present

## 2020-06-15 DIAGNOSIS — Z79890 Hormone replacement therapy (postmenopausal): Secondary | ICD-10-CM

## 2020-06-15 DIAGNOSIS — K631 Perforation of intestine (nontraumatic): Secondary | ICD-10-CM | POA: Diagnosis present

## 2020-06-15 DIAGNOSIS — Z79891 Long term (current) use of opiate analgesic: Secondary | ICD-10-CM

## 2020-06-15 DIAGNOSIS — I110 Hypertensive heart disease with heart failure: Secondary | ICD-10-CM | POA: Diagnosis present

## 2020-06-15 DIAGNOSIS — K567 Ileus, unspecified: Secondary | ICD-10-CM | POA: Diagnosis present

## 2020-06-15 DIAGNOSIS — E876 Hypokalemia: Secondary | ICD-10-CM | POA: Diagnosis not present

## 2020-06-15 DIAGNOSIS — G8929 Other chronic pain: Secondary | ICD-10-CM | POA: Diagnosis present

## 2020-06-15 DIAGNOSIS — D649 Anemia, unspecified: Secondary | ICD-10-CM | POA: Diagnosis present

## 2020-06-15 DIAGNOSIS — C772 Secondary and unspecified malignant neoplasm of intra-abdominal lymph nodes: Secondary | ICD-10-CM | POA: Diagnosis present

## 2020-06-15 DIAGNOSIS — J449 Chronic obstructive pulmonary disease, unspecified: Secondary | ICD-10-CM | POA: Diagnosis present

## 2020-06-15 DIAGNOSIS — Z85048 Personal history of other malignant neoplasm of rectum, rectosigmoid junction, and anus: Secondary | ICD-10-CM | POA: Diagnosis not present

## 2020-06-15 DIAGNOSIS — A419 Sepsis, unspecified organism: Secondary | ICD-10-CM | POA: Diagnosis not present

## 2020-06-15 DIAGNOSIS — D72829 Elevated white blood cell count, unspecified: Secondary | ICD-10-CM

## 2020-06-15 DIAGNOSIS — N179 Acute kidney failure, unspecified: Secondary | ICD-10-CM

## 2020-06-15 DIAGNOSIS — R109 Unspecified abdominal pain: Secondary | ICD-10-CM | POA: Diagnosis not present

## 2020-06-15 DIAGNOSIS — Z20822 Contact with and (suspected) exposure to covid-19: Secondary | ICD-10-CM | POA: Diagnosis present

## 2020-06-15 DIAGNOSIS — F209 Schizophrenia, unspecified: Secondary | ICD-10-CM | POA: Diagnosis present

## 2020-06-15 DIAGNOSIS — Z7189 Other specified counseling: Secondary | ICD-10-CM | POA: Diagnosis not present

## 2020-06-15 DIAGNOSIS — Z888 Allergy status to other drugs, medicaments and biological substances status: Secondary | ICD-10-CM

## 2020-06-15 DIAGNOSIS — R1084 Generalized abdominal pain: Secondary | ICD-10-CM

## 2020-06-15 DIAGNOSIS — E039 Hypothyroidism, unspecified: Secondary | ICD-10-CM

## 2020-06-15 DIAGNOSIS — B9620 Unspecified Escherichia coli [E. coli] as the cause of diseases classified elsewhere: Secondary | ICD-10-CM | POA: Diagnosis present

## 2020-06-15 DIAGNOSIS — R197 Diarrhea, unspecified: Secondary | ICD-10-CM

## 2020-06-15 DIAGNOSIS — Z880 Allergy status to penicillin: Secondary | ICD-10-CM

## 2020-06-15 LAB — CBC
HCT: 28.1 % — ABNORMAL LOW (ref 39.0–52.0)
Hemoglobin: 9.1 g/dL — ABNORMAL LOW (ref 13.0–17.0)
MCH: 26.5 pg (ref 26.0–34.0)
MCHC: 32.4 g/dL (ref 30.0–36.0)
MCV: 81.9 fL (ref 80.0–100.0)
Platelets: 140 10*3/uL — ABNORMAL LOW (ref 150–400)
RBC: 3.43 MIL/uL — ABNORMAL LOW (ref 4.22–5.81)
RDW: 16.8 % — ABNORMAL HIGH (ref 11.5–15.5)
WBC: 19.4 10*3/uL — ABNORMAL HIGH (ref 4.0–10.5)
nRBC: 0 % (ref 0.0–0.2)

## 2020-06-15 LAB — COMPREHENSIVE METABOLIC PANEL
ALT: 16 U/L (ref 0–44)
AST: 25 U/L (ref 15–41)
Albumin: 2.7 g/dL — ABNORMAL LOW (ref 3.5–5.0)
Alkaline Phosphatase: 90 U/L (ref 38–126)
Anion gap: 9 (ref 5–15)
BUN: 25 mg/dL — ABNORMAL HIGH (ref 8–23)
CO2: 30 mmol/L (ref 22–32)
Calcium: 8.1 mg/dL — ABNORMAL LOW (ref 8.9–10.3)
Chloride: 101 mmol/L (ref 98–111)
Creatinine, Ser: 1.44 mg/dL — ABNORMAL HIGH (ref 0.61–1.24)
GFR calc Af Amer: 59 mL/min — ABNORMAL LOW (ref >60)
GFR calc non Af Amer: 51 mL/min — ABNORMAL LOW (ref >60)
Glucose, Bld: 65 mg/dL — ABNORMAL LOW (ref 70–99)
Potassium: 3.4 mmol/L — ABNORMAL LOW (ref 3.5–5.1)
Sodium: 140 mmol/L (ref 135–145)
Total Bilirubin: 0.7 mg/dL (ref 0.3–1.2)
Total Protein: 7.3 g/dL (ref 6.5–8.1)

## 2020-06-15 LAB — BASIC METABOLIC PANEL
Anion gap: 11 (ref 5–15)
BUN: 26 mg/dL — ABNORMAL HIGH (ref 8–23)
CO2: 28 mmol/L (ref 22–32)
Calcium: 8.4 mg/dL — ABNORMAL LOW (ref 8.9–10.3)
Chloride: 102 mmol/L (ref 98–111)
Creatinine, Ser: 1.22 mg/dL (ref 0.61–1.24)
GFR calc Af Amer: >60 mL/min (ref >60)
GFR calc non Af Amer: >60 mL/min (ref >60)
Glucose, Bld: 53 mg/dL — ABNORMAL LOW (ref 70–99)
Potassium: 3.4 mmol/L — ABNORMAL LOW (ref 3.5–5.1)
Sodium: 141 mmol/L (ref 135–145)

## 2020-06-15 LAB — PROTIME-INR
INR: 1.4 — ABNORMAL HIGH (ref 0.8–1.2)
Prothrombin Time: 16.4 seconds — ABNORMAL HIGH (ref 11.4–15.2)

## 2020-06-15 LAB — URINALYSIS, COMPLETE (UACMP) WITH MICROSCOPIC
Glucose, UA: NEGATIVE mg/dL
Hgb urine dipstick: NEGATIVE
Ketones, ur: NEGATIVE mg/dL
Nitrite: NEGATIVE
Protein, ur: 30 mg/dL — AB
Specific Gravity, Urine: 1.02 (ref 1.005–1.030)
pH: 5 (ref 5.0–8.0)

## 2020-06-15 LAB — CBC WITH DIFFERENTIAL/PLATELET
Abs Immature Granulocytes: 1.84 10*3/uL — ABNORMAL HIGH (ref 0.00–0.07)
Basophils Absolute: 0.2 10*3/uL — ABNORMAL HIGH (ref 0.0–0.1)
Basophils Relative: 1 %
Eosinophils Absolute: 0 10*3/uL (ref 0.0–0.5)
Eosinophils Relative: 0 %
HCT: 30.2 % — ABNORMAL LOW (ref 39.0–52.0)
Hemoglobin: 9.7 g/dL — ABNORMAL LOW (ref 13.0–17.0)
Immature Granulocytes: 8 %
Lymphocytes Relative: 4 %
Lymphs Abs: 1 10*3/uL (ref 0.7–4.0)
MCH: 26.8 pg (ref 26.0–34.0)
MCHC: 32.1 g/dL (ref 30.0–36.0)
MCV: 83.4 fL (ref 80.0–100.0)
Monocytes Absolute: 2.1 10*3/uL — ABNORMAL HIGH (ref 0.1–1.0)
Monocytes Relative: 9 %
Neutro Abs: 19.2 10*3/uL — ABNORMAL HIGH (ref 1.7–7.7)
Neutrophils Relative %: 78 %
Platelets: 155 10*3/uL (ref 150–400)
RBC: 3.62 MIL/uL — ABNORMAL LOW (ref 4.22–5.81)
RDW: 16.4 % — ABNORMAL HIGH (ref 11.5–15.5)
WBC: 24.4 10*3/uL — ABNORMAL HIGH (ref 4.0–10.5)
nRBC: 0 % (ref 0.0–0.2)

## 2020-06-15 LAB — TSH: TSH: 4.61 u[IU]/mL — ABNORMAL HIGH (ref 0.350–4.500)

## 2020-06-15 LAB — TROPONIN I (HIGH SENSITIVITY)
Troponin I (High Sensitivity): 26 ng/L — ABNORMAL HIGH (ref <18)
Troponin I (High Sensitivity): 34 ng/L — ABNORMAL HIGH (ref <18)

## 2020-06-15 LAB — SARS CORONAVIRUS 2 BY RT PCR (HOSPITAL ORDER, PERFORMED IN ~~LOC~~ HOSPITAL LAB): SARS Coronavirus 2: NEGATIVE

## 2020-06-15 LAB — LIPASE, BLOOD: Lipase: 22 U/L (ref 11–51)

## 2020-06-15 LAB — LACTIC ACID, PLASMA
Lactic Acid, Venous: 1.2 mmol/L (ref 0.5–1.9)
Lactic Acid, Venous: 1.2 mmol/L (ref 0.5–1.9)

## 2020-06-15 LAB — PROCALCITONIN: Procalcitonin: 132.27 ng/mL

## 2020-06-15 LAB — MAGNESIUM: Magnesium: 1.7 mg/dL (ref 1.7–2.4)

## 2020-06-15 LAB — CORTISOL-AM, BLOOD: Cortisol - AM: 21.4 ug/dL (ref 6.7–22.6)

## 2020-06-15 MED ORDER — PANTOPRAZOLE SODIUM 40 MG PO TBEC
40.0000 mg | DELAYED_RELEASE_TABLET | ORAL | Status: DC
  Administered 2020-06-15 – 2020-06-22 (×7): 40 mg via ORAL
  Filled 2020-06-15 (×8): qty 1

## 2020-06-15 MED ORDER — FUROSEMIDE 40 MG PO TABS
20.0000 mg | ORAL_TABLET | Freq: Two times a day (BID) | ORAL | Status: DC
  Administered 2020-06-15 – 2020-06-23 (×14): 20 mg via ORAL
  Filled 2020-06-15 (×15): qty 1

## 2020-06-15 MED ORDER — ONDANSETRON HCL 4 MG/2ML IJ SOLN
4.0000 mg | Freq: Once | INTRAMUSCULAR | Status: AC
  Administered 2020-06-15: 4 mg via INTRAVENOUS
  Filled 2020-06-15: qty 2

## 2020-06-15 MED ORDER — LEVOTHYROXINE SODIUM 50 MCG PO TABS
200.0000 ug | ORAL_TABLET | Freq: Every day | ORAL | Status: DC
  Administered 2020-06-15 – 2020-06-23 (×8): 200 ug via ORAL
  Filled 2020-06-15 (×8): qty 4

## 2020-06-15 MED ORDER — MAGNESIUM SULFATE 2 GM/50ML IV SOLN
2.0000 g | Freq: Once | INTRAVENOUS | Status: AC
  Administered 2020-06-15: 14:00:00 2 g via INTRAVENOUS
  Filled 2020-06-15: qty 50

## 2020-06-15 MED ORDER — ACETAMINOPHEN 325 MG PO TABS: 650.0000 mg | ORAL_TABLET | Freq: Four times a day (QID) | ORAL | Status: DC | PRN

## 2020-06-15 MED ORDER — PROMETHAZINE HCL 25 MG PO TABS
12.5000 mg | ORAL_TABLET | Freq: Four times a day (QID) | ORAL | Status: DC | PRN
  Filled 2020-06-15: qty 1

## 2020-06-15 MED ORDER — POTASSIUM CHLORIDE 20 MEQ PO PACK
40.0000 meq | PACK | Freq: Once | ORAL | Status: AC
  Administered 2020-06-15: 10:00:00 40 meq via ORAL
  Filled 2020-06-15 (×2): qty 2

## 2020-06-15 MED ORDER — PRO-STAT SUGAR FREE PO LIQD
30.0000 mL | Freq: Three times a day (TID) | ORAL | Status: DC
  Administered 2020-06-15 – 2020-06-23 (×15): 30 mL via ORAL

## 2020-06-15 MED ORDER — HYDROXYCHLOROQUINE SULFATE 200 MG PO TABS
200.0000 mg | ORAL_TABLET | Freq: Two times a day (BID) | ORAL | Status: DC
  Administered 2020-06-15 – 2020-06-22 (×14): 200 mg via ORAL
  Filled 2020-06-15 (×20): qty 1

## 2020-06-15 MED ORDER — POTASSIUM CHLORIDE IN NACL 20-0.9 MEQ/L-% IV SOLN
INTRAVENOUS | Status: DC
  Filled 2020-06-15 (×9): qty 1000

## 2020-06-15 MED ORDER — VANCOMYCIN HCL 2000 MG/400ML IV SOLN
2000.0000 mg | Freq: Once | INTRAVENOUS | Status: AC
  Administered 2020-06-15: 06:00:00 2000 mg via INTRAVENOUS
  Filled 2020-06-15: qty 400

## 2020-06-15 MED ORDER — METOPROLOL TARTRATE 50 MG PO TABS
75.0000 mg | ORAL_TABLET | Freq: Two times a day (BID) | ORAL | Status: DC
  Administered 2020-06-15 – 2020-06-22 (×14): 75 mg via ORAL
  Filled 2020-06-15 (×16): qty 1

## 2020-06-15 MED ORDER — BENZTROPINE MESYLATE 1 MG PO TABS
0.5000 mg | ORAL_TABLET | Freq: Two times a day (BID) | ORAL | Status: DC
  Administered 2020-06-15 – 2020-06-22 (×14): 0.5 mg via ORAL
  Filled 2020-06-15 (×21): qty 1

## 2020-06-15 MED ORDER — SODIUM CHLORIDE 0.9 % IV SOLN
1.0000 g | Freq: Three times a day (TID) | INTRAVENOUS | Status: AC
  Administered 2020-06-15 – 2020-06-21 (×22): 1 g via INTRAVENOUS
  Filled 2020-06-15 (×23): qty 1

## 2020-06-15 MED ORDER — MAGNESIUM HYDROXIDE 400 MG/5ML PO SUSP
30.0000 mL | Freq: Every day | ORAL | Status: DC | PRN
  Filled 2020-06-15: qty 30

## 2020-06-15 MED ORDER — POTASSIUM CHLORIDE CRYS ER 20 MEQ PO TBCR
10.0000 meq | EXTENDED_RELEASE_TABLET | Freq: Every day | ORAL | Status: DC
  Administered 2020-06-15 – 2020-06-22 (×7): 10 meq via ORAL
  Filled 2020-06-15 (×8): qty 1

## 2020-06-15 MED ORDER — HYDROCORTISONE 10 MG PO TABS
10.0000 mg | ORAL_TABLET | Freq: Two times a day (BID) | ORAL | Status: DC
  Administered 2020-06-15 – 2020-06-22 (×14): 10 mg via ORAL
  Filled 2020-06-15 (×20): qty 1

## 2020-06-15 MED ORDER — ONDANSETRON HCL 4 MG PO TABS
4.0000 mg | ORAL_TABLET | Freq: Four times a day (QID) | ORAL | Status: DC | PRN
  Administered 2020-06-17 – 2020-06-19 (×3): 4 mg via ORAL
  Filled 2020-06-15 (×3): qty 1

## 2020-06-15 MED ORDER — ALBUTEROL SULFATE (2.5 MG/3ML) 0.083% IN NEBU: 3.0000 mL | INHALATION_SOLUTION | RESPIRATORY_TRACT | Status: DC | PRN

## 2020-06-15 MED ORDER — DOCUSATE SODIUM 100 MG PO CAPS
100.0000 mg | ORAL_CAPSULE | Freq: Two times a day (BID) | ORAL | Status: DC
  Administered 2020-06-15 – 2020-06-22 (×14): 100 mg via ORAL
  Filled 2020-06-15 (×15): qty 1

## 2020-06-15 MED ORDER — POLYETHYLENE GLYCOL 3350 17 G PO PACK
17.0000 g | PACK | Freq: Every day | ORAL | Status: DC
  Administered 2020-06-16 – 2020-06-23 (×7): 17 g via ORAL
  Filled 2020-06-15 (×9): qty 1

## 2020-06-15 MED ORDER — FLUPHENAZINE HCL 5 MG PO TABS
15.0000 mg | ORAL_TABLET | Freq: Two times a day (BID) | ORAL | Status: DC
  Administered 2020-06-15 – 2020-06-22 (×14): 15 mg via ORAL
  Filled 2020-06-15 (×21): qty 3

## 2020-06-15 MED ORDER — SODIUM CHLORIDE 0.9 % IV BOLUS
1000.0000 mL | Freq: Once | INTRAVENOUS | Status: AC
  Administered 2020-06-15: 1000 mL via INTRAVENOUS

## 2020-06-15 MED ORDER — LORAZEPAM 2 MG PO TABS
2.0000 mg | ORAL_TABLET | Freq: Four times a day (QID) | ORAL | Status: DC | PRN
  Administered 2020-06-17 – 2020-06-23 (×4): 2 mg via ORAL
  Filled 2020-06-15 (×5): qty 1

## 2020-06-15 MED ORDER — SODIUM CHLORIDE 0.9 % IV SOLN: INTRAVENOUS | Status: DC

## 2020-06-15 MED ORDER — ONDANSETRON HCL 4 MG/2ML IJ SOLN
4.0000 mg | Freq: Four times a day (QID) | INTRAMUSCULAR | Status: DC | PRN
  Administered 2020-06-15 – 2020-06-24 (×12): 4 mg via INTRAVENOUS
  Filled 2020-06-15 (×14): qty 2

## 2020-06-15 MED ORDER — VANCOMYCIN HCL 1750 MG/350ML IV SOLN
1750.0000 mg | INTRAVENOUS | Status: DC
  Administered 2020-06-16: 05:00:00 1750 mg via INTRAVENOUS
  Filled 2020-06-15: qty 350

## 2020-06-15 MED ORDER — FERROUS SULFATE 325 (65 FE) MG PO TABS
325.0000 mg | ORAL_TABLET | Freq: Two times a day (BID) | ORAL | Status: DC
  Administered 2020-06-15 – 2020-06-23 (×14): 325 mg via ORAL
  Filled 2020-06-15 (×15): qty 1

## 2020-06-15 MED ORDER — METHOCARBAMOL 750 MG PO TABS
750.0000 mg | ORAL_TABLET | Freq: Every day | ORAL | Status: DC
  Administered 2020-06-15 – 2020-06-17 (×3): 750 mg via ORAL
  Filled 2020-06-15 (×4): qty 1

## 2020-06-15 MED ORDER — VANCOMYCIN HCL IN DEXTROSE 1-5 GM/200ML-% IV SOLN: 1000.0000 mg | Freq: Once | INTRAVENOUS | Status: DC

## 2020-06-15 MED ORDER — TAMSULOSIN HCL 0.4 MG PO CAPS
0.4000 mg | ORAL_CAPSULE | Freq: Every day | ORAL | Status: DC
  Administered 2020-06-15 – 2020-06-22 (×7): 0.4 mg via ORAL
  Filled 2020-06-15 (×8): qty 1

## 2020-06-15 MED ORDER — MORPHINE SULFATE (PF) 2 MG/ML IV SOLN: 2.0000 mg | INTRAVENOUS | Status: DC | PRN

## 2020-06-15 MED ORDER — HYDRALAZINE HCL 50 MG PO TABS
50.0000 mg | ORAL_TABLET | Freq: Four times a day (QID) | ORAL | Status: DC
  Administered 2020-06-15 – 2020-06-23 (×28): 50 mg via ORAL
  Filled 2020-06-15 (×30): qty 1

## 2020-06-15 MED ORDER — TRAMADOL HCL 50 MG PO TABS
50.0000 mg | ORAL_TABLET | ORAL | Status: DC | PRN
  Administered 2020-06-15 – 2020-06-23 (×6): 50 mg via ORAL
  Filled 2020-06-15 (×6): qty 1

## 2020-06-15 MED ORDER — ACETAMINOPHEN 650 MG RE SUPP: 650.0000 mg | Freq: Four times a day (QID) | RECTAL | Status: DC | PRN

## 2020-06-15 MED ORDER — ENOXAPARIN SODIUM 40 MG/0.4ML ~~LOC~~ SOLN
40.0000 mg | SUBCUTANEOUS | Status: DC
  Administered 2020-06-15 – 2020-06-23 (×9): 40 mg via SUBCUTANEOUS
  Filled 2020-06-15 (×9): qty 0.4

## 2020-06-15 MED ORDER — ADULT MULTIVITAMIN W/MINERALS CH
1.0000 | ORAL_TABLET | Freq: Every day | ORAL | Status: DC
  Administered 2020-06-15 – 2020-06-22 (×7): 1 via ORAL
  Filled 2020-06-15 (×8): qty 1

## 2020-06-15 MED ORDER — TRAZODONE HCL 50 MG PO TABS
25.0000 mg | ORAL_TABLET | Freq: Every evening | ORAL | Status: DC | PRN
  Administered 2020-06-15 – 2020-06-19 (×5): 25 mg via ORAL
  Filled 2020-06-15 (×6): qty 1

## 2020-06-15 NOTE — Progress Notes (Signed)
Pharmacy Antibiotic Note  Jerry [name] is a 64 y.o. male admitted on 06/15/2020 with IAI.  Pharmacy has been consulted for Vancomycin dosing.  Plan: Vancomycin 1750 mg IV Q 24 hrs. Goal AUC 400-550. Expected AUC: 523.4, Css min 11.8 SCr used: 1.44  Height: 6\' 2"  (188 cm) Weight: 122.9 kg (270 lb 15.1 oz) IBW/kg (Calculated) : 82.2  Temp (24hrs), Avg:98 F (36.7 C), Min:97.7 F (36.5 C), Max:98.2 F (36.8 C)  Recent Labs  Lab 06/15/20 0048  WBC 24.4*  CREATININE 1.44*  LATICACIDVEN 1.2    Estimated Creatinine Clearance: 72.2 mL/min (A) (by C-G formula based on SCr of 1.44 mg/dL (H)).    Allergies  Allergen Reactions  . Methadone   . Penicillins     Tolerates cephalosporins   . Propoxyphene   . Sulfa Antibiotics   . Valproic Acid And Related     Thrombocytopenia    Antimicrobials this admission:   >>    >>   Dose adjustments this admission:   Microbiology results:  BCx:   UCx:    Sputum:    MRSA PCR:   Thank you for allowing pharmacy to be a part of this patient's care.  Hart Robinsons A 06/15/2020 5:37 AM

## 2020-06-15 NOTE — H&P (Addendum)
[place] [facility] [place] NAME: Jerry [name]    MR#:  [id number]  DATE OF BIRTH:  Jul 28, 1956  DATE OF ADMISSION:  06/15/2020  PRIMARY CARE PHYSICIAN: [name], MD   REQUESTING/REFERRING PHYSICIAN: Lurline Hare, MD  CHIEF COMPLAINT:   Chief Complaint  Patient presents with  . Emesis  . Abdominal Pain    HISTORY OF PRESENT ILLNESS:  Jerry [name]  is a 64 y.o. male with a known history of BPH, CHF, COPD, hypertension, colorectal carcinoma and rheumatoid arthritis, presented to the emergency room with acute onset of nausea without significant vomiting as well as diarrhea and abdominal pain.  Symptoms have been going on over the last couple of days at [facility] where he resides.  He has been having occasional cough without wheezing or dyspnea chest pain or palpitations.  No bilious vomitus or hematemesis.  No dysuria, oliguria or hematuria or flank pain.   Upon presentation to the emergency room, vital signs were initially within normal with AROM blood pressure was 89/68 with respiratory to 22.  Labs revealed mild hypokalemia of 3.4 mm 25 with creatinine 1.44 above previous normal levels.  Serum lipase was 22 troponin I was 26 and lactic acid 1.2.  CBC showed significant leukocytosis of 24.4 with anemia better than baseline and neutrophilia.  Urinalysis showed 30 protein.  Blood and urine cultures were drawn.  The patient was given 4 mg of IV Zofran and 1 L bolus of IV normal saline as well as IV meropenem.  He will be admitted to a medically monitored bed for further evaluation and management.  PAST MEDICAL HISTORY:   Past Medical History:  Diagnosis Date  . BPH (benign prostatic hyperplasia)   . CHF (congestive heart failure) ([place])   . Chronic pain   . COPD (chronic obstructive pulmonary disease) ([place])   . Hypertension   . Morbid obesity ([place])   . Pressure ulcer   . Rheumatoid arthritis ([place])   . Schizophrenia ([place])   . Thyroid disease      PAST SURGICAL HISTORY:   Past Surgical History:  Procedure Laterality Date  . COLONOSCOPY WITH PROPOFOL N/A 11/02/2019   Procedure: COLONOSCOPY WITH PROPOFOL;  Surgeon: Lucilla Lame, MD;  Location: [facility] ENDOSCOPY;  Service: Endoscopy;  Laterality: N/A;  . COLONOSCOPY WITH PROPOFOL N/A 11/04/2019   Procedure: COLONOSCOPY WITH PROPOFOL;  Surgeon: Jonathon Bellows, MD;  Location: [facility] ENDOSCOPY;  Service: Gastroenterology;  Laterality: N/A;  . ESOPHAGOGASTRODUODENOSCOPY (EGD) WITH PROPOFOL N/A 10/31/2019   Procedure: ESOPHAGOGASTRODUODENOSCOPY (EGD) WITH PROPOFOL;  Surgeon: Virgel Manifold, MD;  Location: [facility] ENDOSCOPY;  Service: Endoscopy;  Laterality: N/A;  . PORTACATH PLACEMENT Right 12/11/2019   Procedure: INSERTION PORT-A-CATH;  Surgeon: Benjamine Sprague, DO;  Location: [facility] ORS;  Service: General;  Laterality: Right;    SOCIAL HISTORY:   Social History   Tobacco Use  . Smoking status: Never Smoker  . Smokeless tobacco: Never Used  Substance Use Topics  . Alcohol use: No    FAMILY HISTORY:   Family History  Family history unknown: Yes  No pertinent familial diseases.  DRUG ALLERGIES:   Allergies  Allergen Reactions  . Methadone   . Penicillins     Tolerates cephalosporins   . Propoxyphene   . Sulfa Antibiotics   . Valproic Acid And Related     Thrombocytopenia    REVIEW OF SYSTEMS:   ROS As per history of present illness. All pertinent systems were reviewed above. Constitutional, HEENT,  cardiovascular, respiratory, GI, GU, musculoskeletal, neuro, psychiatric, endocrine, integumentary and hematologic systems were reviewed and are otherwise negative/unremarkable except for positive findings mentioned above in the HPI.   MEDICATIONS AT HOME:   Prior to Admission medications   Medication Sig Start Date End Date Taking? Authorizing Provider  acetaminophen (TYLENOL) 325 MG tablet Take 650 mg by mouth every 4 (four) hours as needed for mild pain or moderate pain.    Yes [provider]  albuterol (PROVENTIL HFA;VENTOLIN HFA) 108 (90 Base) MCG/ACT inhaler Inhale 1 puff into the lungs every 3 (three) hours as needed for wheezing or shortness of breath.    Yes [provider]  ammonium lactate (LAC-HYDRIN) 12 % lotion Apply 1 application topically 2 (two) times daily. Apply topically to bilateral feet   Yes [provider]  ATIVAN 2 MG tablet Take 2 mg by mouth every 6 (six) hours as needed. 04/30/20  Yes [provider]  benztropine (COGENTIN) 0.5 MG tablet Take 0.5 mg by mouth 2 (two) times daily.   Yes [provider]  docusate sodium (COLACE) 100 MG capsule Take 100 mg by mouth 2 (two) times daily.   Yes [provider]  ferrous sulfate 325 (65 FE) MG tablet Take 1 tablet (325 mg total) by mouth 2 (two) times daily with a meal. 11/08/19  Yes Memon, Jolaine Artist, MD  fluPHENAZine (PROLIXIN) 5 MG tablet Take 3 tablets (15 mg total) by mouth 2 (two) times daily. 12/11/19  Yes Sakai, Isami, DO  furosemide (LASIX) 20 MG tablet Take 1 tablet (20 mg total) by mouth 2 (two) times daily. 12/11/19 12/10/20 Yes Sakai, Isami, DO  hydrALAZINE (APRESOLINE) 50 MG tablet Take 50 mg by mouth 4 (four) times daily. 04/30/20  Yes [provider]  hydrocortisone (CORTEF) 10 MG tablet Take 1 tablet (10 mg total) by mouth 2 (two) times daily. 12/30/17  Yes Fritzi Mandes, MD  hydroxychloroquine (PLAQUENIL) 200 MG tablet Take 200 mg by mouth 2 (two) times daily.   Yes [provider]  levothyroxine (SYNTHROID) 200 MCG tablet Take 200 mcg by mouth daily before breakfast.    Yes [provider]  lidocaine-prilocaine (EMLA) cream Apply 1 application topically as needed. To port a cath site - 1 hour prior to infusion 12/10/19  Yes Brahmanday, Elisha Headland, MD  methocarbamol (ROBAXIN) 750 MG tablet Take 750 mg by mouth at bedtime.   Yes [provider]  metoprolol tartrate (LOPRESSOR) 50 MG tablet Take 1 tablet (50 mg  total) by mouth 2 (two) times daily. Patient taking differently: Take 75 mg by mouth 2 (two) times daily.  07/28/19  Yes Bettey Costa, MD  Multiple Vitamins-Minerals (MULTIVITAMIN WITH MINERALS) tablet Take 1 tablet by mouth daily.   Yes [provider]  pantoprazole (PROTONIX) 40 MG tablet Take 1 tablet (40 mg total) by mouth daily. Patient taking differently: Take 40 mg by mouth every morning.  11/09/19  Yes Kathie Dike, MD  Polyethylene Glycol 3350 (MIRALAX PO) Take 17 g by mouth daily.   Yes [provider]  potassium chloride (KLOR-CON) 10 MEQ tablet Take 10 mEq by mouth daily. Open contents and sprinkle in applesauce or pudding   Yes [provider]  promethazine (PHENERGAN) 12.5 MG tablet Take 12.5 mg by mouth every 6 (six) hours as needed for nausea or vomiting.   Yes [provider]  traMADol (ULTRAM) 50 MG tablet Take 1 tablet (50 mg total) by mouth every 6 (six) hours as needed for moderate  pain or severe pain. Patient taking differently: Take 50 mg by mouth every 4 (four) hours as needed for moderate pain or severe pain.  11/20/19  Yes Amin, Jeanella Flattery, MD  Amino Acids-Protein Hydrolys (FEEDING SUPPLEMENT, PRO-STAT SUGAR FREE 64,) LIQD Take 30 mLs by mouth 3 (three) times daily with meals. Sugar free    [provider]  guaifenesin (ROBITUSSIN) 100 MG/5ML syrup Take 100 mg by mouth every 4 (four) hours as needed for cough. Patient not taking: Reported on 06/15/2020    [provider]  tamsulosin (FLOMAX) 0.4 MG CAPS capsule Take 0.4 mg by mouth daily after breakfast.  Patient not taking: Reported on 06/15/2020    [provider]  vitamin C (ASCORBIC ACID) 250 MG tablet Take 500 mg by mouth 2 (two) times daily.  Patient not taking: Reported on 06/15/2020    [provider]      VITAL SIGNS:  Blood pressure 120/77, pulse 93, temperature 97.7 F (36.5 C), temperature source Oral, resp. rate 18, height 6\' 2"  (1.88  m), weight 122.9 kg, SpO2 100 %.  PHYSICAL EXAMINATION:  Physical Exam  GENERAL:  64 y.o.-year-old male patient lying in the bed with no acute distress.  He was fairly somnolent but arousable. EYES: Pupils equal, round, reactive to light and accommodation. No scleral icterus. Extraocular muscles intact.  HEENT: Head atraumatic, normocephalic. Oropharynx and nasopharynx clear.  NECK:  Supple, no jugular venous distention. No thyroid enlargement, no tenderness.  LUNGS: Normal breath sounds bilaterally, no wheezing, rales,rhonchi or crepitation. No use of accessory muscles of respiration.  CARDIOVASCULAR: Regular rate and rhythm, S1, S2 normal. No murmurs, rubs, or gallops.  ABDOMEN: Soft, nondistended with mild generalized tenderness mainly in the left lower quadrant without rebound tenderness guarding or rigidity.. Bowel sounds were diminished. No organomegaly or mass.  EXTREMITIES: No pedal edema, cyanosis, or clubbing.  NEUROLOGIC: Cranial nerves II through XII are intact. Muscle strength 5/5 in all extremities. Sensation intact. Gait not checked.  PSYCHIATRIC: The patient is alert and oriented x 3.  Normal affect and good eye contact. SKIN: No obvious rash, lesion, or ulcer.   LABORATORY PANEL:   CBC Recent Labs  Lab 06/15/20 0048  WBC 24.4*  HGB 9.7*  HCT 30.2*  PLT 155   ------------------------------------------------------------------------------------------------------------------  Chemistries  Recent Labs  Lab 06/15/20 0048  NA 140  K 3.4*  CL 101  CO2 30  GLUCOSE 65*  BUN 25*  CREATININE 1.44*  CALCIUM 8.1*  AST 25  ALT 16  ALKPHOS 90  BILITOT 0.7   ------------------------------------------------------------------------------------------------------------------  Cardiac Enzymes No results for input(s): TROPONINI in the last 168  hours. ------------------------------------------------------------------------------------------------------------------  RADIOLOGY:  CT Abdomen Pelvis Wo Contrast  Result Date: 06/15/2020 CLINICAL DATA:  [place]. EXAM: CT ABDOMEN AND PELVIS WITHOUT CONTRAST TECHNIQUE: Multidetector CT imaging of the abdomen and pelvis was performed following the standard protocol without IV contrast. COMPARISON:  November 07, 2019 FINDINGS: Lower chest: There are fibrotic changes at the lung bases bilaterally.The heart size is normal. Hepatobiliary: The liver is normal. Cholelithiasis without acute inflammation.There is no biliary ductal dilation. Pancreas: Normal contours without ductal dilatation. No peripancreatic fluid collection. Spleen: Unremarkable. Adrenals/Urinary Tract: --Adrenal glands: Unremarkable. --Right kidney/ureter: No hydronephrosis or radiopaque kidney stones. --Left kidney/ureter: No hydronephrosis or radiopaque kidney stones. --Urinary bladder: Unremarkable. Stomach/Bowel: --Stomach/Duodenum: No hiatal hernia or other gastric abnormality. Normal duodenal course and caliber. --Small bowel: There are few mildly dilated loops of small bowel in the abdomen without evidence for high-grade obstruction. --  Colon: There are few air-fluid levels in the right hemicolon. There is a mass involving the ascending colon measuring approximately 5.5 cm in length (coronal series 5, image 39). There are surrounding inflammatory changes and mildly enlarged regional lymph nodes. There is a questionable pocket extraluminal gas adjacent to this mass (axial series 2, image 49). --Appendix: Not visualized. No right lower quadrant inflammation or free fluid. Vascular/Lymphatic: Atherosclerotic calcification is present within the non-aneurysmal abdominal aorta, without hemodynamically significant stenosis. --No retroperitoneal lymphadenopathy. --No mesenteric lymphadenopathy. --No pelvic or inguinal lymphadenopathy. Reproductive:  Unremarkable Other: No ascites or free air. The abdominal wall is normal. Musculoskeletal. There are end-stage degenerative changes both hips. IMPRESSION: 1. No evidence for small bowel obstruction. 2. There is a 5.5 cm mass involving the ascending colon, significantly increased in size from prior study. This mass is favored to represent the patient's reported colorectal carcinoma. There is a questionable focus of extraluminal gas adjacent to this mass which may indicate underlying micro perforation. There are mildly enlarged regional lymph nodes concerning for developing nodal metastatic disease. 3. There is cholelithiasis without secondary signs of acute cholecystitis. 4. Additional chronic findings as detailed above. Aortic Atherosclerosis (ICD10-I70.0). Electronically Signed   By: Constance Holster M.D.   On: 06/15/2020 02:00   DG Chest Port 1 View  Result Date: 06/15/2020 CLINICAL DATA:  Chronic shortness of breath EXAM: PORTABLE CHEST 1 VIEW COMPARISON:  December 11, 2019 FINDINGS: The heart size is stable but enlarged. There is a stable right-sided Port-A-Cath. There are end-stage degenerative changes of both glenohumeral joints. Chronic interstitial lung markings are again noted bilaterally. The there is no pneumothorax. There is no definite focal infiltrate. IMPRESSION: 1. No acute cardiopulmonary process. 2. Again noted are findings of pulmonary fibrosis without evidence for a focal infiltrate. 3. Stable positioning of the right-sided Port-A-Cath. 4. Cardiomegaly. Electronically Signed   By: Constance Holster M.D.   On: 06/15/2020 01:14      IMPRESSION AND PLAN:   1.  Abdominal pain, likely secondary to enlarging ascending colon mass with history of colorectal carcinoma with possible microperforation.  He has leukocytosis and possibly sepsis without severe sepsis or septic shock given hypotension, tachycardia and significant leukocytosis.. -The patient will be admitted to a medical monitored  bed. -Pain management will be provided. -We will place the patient on IV meropenem given the possibility of microperforation. -General surgery consultation will be obtained by Dr. Dahlia Byes who was notified about the patient.  2.  Acute kidney injury. -The patient will be hydrated with IV normal saline and will follow BMP. -Nephrotoxins will be held off.  3.  Hypokalemia. -Potassium will be aggressively replaced and magnesium level will be checked.  4.  Hypertension. -We will continue hydralazine.  5.  Rheumatoid arthritis. -We will continue Plaquenil  6.  Hypothyroidism. -We will continue Synthroid and check TSH.  7.  DVT prophylaxis. -Subcutaneous Lovenox.    All the records are reviewed and case discussed with ED provider. The plan of care was discussed in details with the patient (and family). I answered all questions. The patient agreed to proceed with the above mentioned plan. Further management will depend upon hospital course.   CODE STATUS: I have discussed the CODE STATUS with the patient's legal guardian Ms. [name] and the patient actually has MOST form that states that he is DNI only but we can attempt resuscitation with CPR  Status is: Inpatient  Remains inpatient appropriate because:Hemodynamically unstable, Ongoing active pain requiring inpatient pain management, Altered  mental status, Ongoing diagnostic testing needed not appropriate for outpatient work up, Unsafe d/c plan, IV treatments appropriate due to intensity of illness or inability to take PO and Inpatient level of care appropriate due to severity of illness   Dispo: The patient is from: SNF              Anticipated d/c is to: SNF              Anticipated d/c date is: 3 days              Patient currently is not medically stable to d/c.   TOTAL TIME TAKING CARE OF THIS PATIENT: 55 minutes.    Christel Mormon M.D on 06/15/2020 at [place] AM  [facility]   From 7 PM-7 AM, contact  night-coverage [email or web address]  CC: Primary care physician; [name], MD   Note: This dictation was prepared with Dragon dictation along with smaller phrase technology. Any transcriptional typo errors that result from this process are unintentional.

## 2020-06-15 NOTE — ED Notes (Signed)
Report provided to [place], [place]

## 2020-06-15 NOTE — Progress Notes (Signed)
Same day rounding progress note  Patient seen and examined.  Agree with admitting hospitalist H&P assessment and plan  Enlarging ascending colon mass with history of colorectal carcinoma - Appreciate surgery input.  Conservative management recommended. -Patient is requesting something to eat.  As he is not having any more nausea or vomiting I will start him on sips of clear liquids. - Symptomatic management for now -Pending palliative care consult for goals of care discussion.  Please note patient is followed by [facility] care palliative as an outpatient.  Niece is POA. -Continue empiric antibiotic as ordered  Hypokalemia/hypomagnesemia Replete and recheck  Acute kidney injury Likely prerenal improving with hydration  Hypothyroidism TSH is 4.6, for now I will continue Synthroid at 200 mcg daily.   Time spent: 25 mins  Patient's niece Jerry [name] is updated over phone today

## 2020-06-15 NOTE — NC FL2 (Signed)
[place] LEVEL OF CARE SCREENING TOOL     IDENTIFICATION  Patient Name: Jerry [name] Birthdate: 11-Nov-1956 Sex: male Admission Date (Current Location): 06/15/2020  [facility] and [place] Number:  Engineering geologist and Address:  [facility], [address] [place], [place] [address]      Provider Number: [id number]  Attending Physician Name and Address:  Max Sane, MD  Relative Name and Phone Number:       Current Level of Care: Hospital Recommended Level of Care: [place] Prior Approval Number:    Date Approved/Denied:   PASRR Number: [id number]  Discharge Plan: SNF    Current Diagnoses: Patient Active Problem List   Diagnosis Date Noted   Abdominal pain 06/15/2020   Iron deficiency anemia due to chronic blood loss 12/10/2019   Colon adenocarcinoma ([place]) 11/08/2019   DNR (do not resuscitate) 11/08/2019   Chronic diastolic CHF (congestive heart failure) ([place]) 11/08/2019   Rheumatoid arthritis ([place]) 11/08/2019   Chronic respiratory failure with hypoxia ([place]) 11/08/2019   Palliative care encounter    Mass of colon 11/04/2019   Schizo affective schizophrenia ([place])    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 ([place]) 10/30/2019   Primary adrenocortical insufficiency ([place]) 10/30/2019   Anemia 10/30/2019   Gastrointestinal hemorrhage    Chronic obstructive pulmonary disease ([place])    COVID-19 virus infection 09/11/2019   Normocytic anemia 08/25/2019   Community acquired pneumonia    DNR (do not resuscitate) discussion    SOB (shortness of breath) 07/16/2019   Obesity, Class III, BMI 40-49.9 (morbid obesity) ([place])    Schizophrenia ([place])    Palliative care by specialist    Advance care planning    Goals of care, counseling/discussion    CHF (congestive heart failure) ([place])    Altered mental status    Septic shock ([place]) 08/02/2017   Pneumonia 06/21/2017   Thrombocytopenia ([place])  06/21/2017   Leukopenia 06/21/2017   Pressure injury of skin 06/21/2017    Orientation RESPIRATION BLADDER Height & Weight     Self  O2 (2L [place]) Incontinent Weight: 122.9 kg Height:  6\' 2"  (188 cm)  BEHAVIORAL SYMPTOMS/MOOD NEUROLOGICAL BOWEL NUTRITION STATUS      Incontinent Diet (see discharge summary)  AMBULATORY STATUS COMMUNICATION OF NEEDS Skin   Total Care Verbally Normal                       Personal Care Assistance Level of Assistance  Bathing, Feeding, Dressing Bathing Assistance: Maximum assistance Feeding assistance: Limited assistance Dressing Assistance: Maximum assistance     Functional Limitations Info             SPECIAL CARE FACTORS FREQUENCY                       Contractures Contractures Info: Not present    Additional Factors Info  Code Status, Allergies Code Status Info: Partial Allergies Info: Methadone, PCN, propoxyphene, sulfa, valproic acid           Current Medications (06/15/2020):  This is the current hospital active medication list Current Facility-Administered Medications  Medication Dose Route Frequency Provider Last Rate Last Admin   0.9 % NaCl with KCl 20 mEq/ L  infusion   Intravenous Continuous Mansy, Jan A, MD 100 mL/hr at 06/15/20 0657 New Bag at 06/15/20 0657   acetaminophen (TYLENOL) tablet 650 mg  650 mg Oral Q6H PRN Mansy, Arvella Merles, MD  Or   acetaminophen (TYLENOL) suppository 650 mg  650 mg Rectal Q6H PRN Mansy, Jan A, MD       albuterol (PROVENTIL) (2.5 MG/3ML) 0.083% nebulizer solution 3 mL  3 mL Inhalation Q3H PRN Mansy, Jan A, MD       benztropine (COGENTIN) tablet 0.5 mg  0.5 mg Oral BID Mansy, Jan A, MD   0.5 mg at 06/15/20 1018   docusate sodium (COLACE) capsule 100 mg  100 mg Oral BID Mansy, Jan A, MD   100 mg at 06/15/20 1011   enoxaparin (LOVENOX) injection 40 mg  40 mg Subcutaneous Q24H Mansy, Jan A, MD   40 mg at 06/15/20 1018   feeding supplement (PRO-STAT SUGAR FREE 64) liquid 30 mL   30 mL Oral TID WC Mansy, Jan A, MD       ferrous sulfate tablet 325 mg  325 mg Oral BID WC Mansy, Jan A, MD   325 mg at 06/15/20 1013   fluPHENAZine (PROLIXIN) tablet 15 mg  15 mg Oral BID Mansy, Jan A, MD   15 mg at 06/15/20 1017   furosemide (LASIX) tablet 20 mg  20 mg Oral BID Mansy, Jan A, MD   20 mg at 06/15/20 1011   hydrALAZINE (APRESOLINE) tablet 50 mg  50 mg Oral QID Mansy, Jan A, MD   50 mg at 06/15/20 1339   hydrocortisone (CORTEF) tablet 10 mg  10 mg Oral BID Mansy, Jan A, MD   10 mg at 06/15/20 1013   hydroxychloroquine (PLAQUENIL) tablet 200 mg  200 mg Oral BID Mansy, Jan A, MD   200 mg at 06/15/20 1017   levothyroxine (SYNTHROID) tablet 200 mcg  200 mcg Oral QAC breakfast Mansy, Jan A, MD   200 mcg at 06/15/20 1093   LORazepam (ATIVAN) tablet 2 mg  2 mg Oral Q6H PRN Mansy, Jan A, MD       magnesium hydroxide (MILK OF MAGNESIA) suspension 30 mL  30 mL Oral Daily PRN Mansy, Jan A, MD       meropenem (MERREM) 1 g in sodium chloride 0.9 % 100 mL IVPB  1 g Intravenous Q8H Paulette Blanch, MD 200 mL/hr at 06/15/20 1021 1 g at 06/15/20 1021   methocarbamol (ROBAXIN) tablet 750 mg  750 mg Oral QHS Mansy, Jan A, MD       metoprolol tartrate (LOPRESSOR) tablet 75 mg  75 mg Oral BID Mansy, Jan A, MD   75 mg at 06/15/20 1011   morphine 2 MG/ML injection 2 mg  2 mg Intravenous Q2H PRN Mansy, Jan A, MD       multivitamin with minerals tablet 1 tablet  1 tablet Oral Daily Mansy, Jan A, MD   1 tablet at 06/15/20 1010   ondansetron (ZOFRAN) tablet 4 mg  4 mg Oral Q6H PRN Mansy, Jan A, MD       Or   ondansetron [facility]) injection 4 mg  4 mg Intravenous Q6H PRN Mansy, Jan A, MD       pantoprazole (PROTONIX) EC tablet 40 mg  40 mg Oral BH-q7a Mansy, Jan A, MD   40 mg at 06/15/20 0648   polyethylene glycol (MIRALAX / GLYCOLAX) packet 17 g  17 g Oral Daily Mansy, Jan A, MD       potassium chloride SA (KLOR-CON) CR tablet 10 mEq  10 mEq Oral Daily Mansy, Jan A, MD   10 mEq at 06/15/20  1014   promethazine (PHENERGAN) tablet 12.5 mg  12.5 mg Oral Q6H PRN Mansy, Jan A, MD       tamsulosin [facility]) capsule 0.4 mg  0.4 mg Oral QPC breakfast Mansy, Jan A, MD   0.4 mg at 06/15/20 1012   traMADol (ULTRAM) tablet 50 mg  50 mg Oral Q4H PRN Mansy, Jan A, MD       traZODone (DESYREL) tablet 25 mg  25 mg Oral QHS PRN Mansy, Jan A, MD       [START ON 06/16/2020] vancomycin (VANCOREADY) IVPB 1750 mg/350 mL  1,750 mg Intravenous Q24H Hart Robinsons A, [facility]         Discharge Medications: Please see discharge summary for a list of discharge medications.  Relevant Imaging Results:  Relevant Lab Results:   Additional Information SSN: [id number]  Shelbie Hutching, RN

## 2020-06-15 NOTE — Consult Note (Addendum)
[facility] SURGICAL CONSULTATION NOTE (initial) - cpt: 50093   HISTORY OF PRESENT ILLNESS (HPI):  64 y.o. male presented to [facility] ED overnight for evaluation of emesis and abdominal pain. Patient with a history of schizophrenia which limits history. Per notes, patient comes into the ED from [facility] via EMS secondary to persistent nausea, emesis, and diarrhea throughout the weekend. He had an abdominal XR taken at that facility which was concerning for an ileus. Additionally, patient had been endorsing generalized mild abdominal pain. No fever, chills, cough, chest pain, SOB, or urinary changes. He does have a history of ascending colon cancer discovered on previous admission in December. He was evaluated by Dr Lysle Pearl for this in December of 2020 but deemed a poor surgical candidate. He did have a port placed in January secondary to difficult IV access and need for iron infusions. He has been followed as an outpatient with [facility].   He does not contribute anything to the history and continues to ask why "we can't stick a needle in his belly and get the fluid out?"  Surgery is consulted by hospitalist physician Dr. Eugenie Norrie, MD in this context for evaluation and management of known ascending colon cancer.   PAST MEDICAL HISTORY (PMH):  Past Medical History:  Diagnosis Date  . BPH (benign prostatic hyperplasia)   . CHF (congestive heart failure) ([place])   . Chronic pain   . COPD (chronic obstructive pulmonary disease) ([place])   . Hypertension   . Morbid obesity ([place])   . Pressure ulcer   . Rheumatoid arthritis ([place])   . Schizophrenia ([place])   . Thyroid disease      PAST SURGICAL HISTORY ([place]):  Past Surgical History:  Procedure Laterality Date  . COLONOSCOPY WITH PROPOFOL N/A 11/02/2019   Procedure: COLONOSCOPY WITH PROPOFOL;  Surgeon: Lucilla Lame, MD;  Location: [facility] ENDOSCOPY;  Service: Endoscopy;  Laterality: N/A;  . COLONOSCOPY WITH PROPOFOL N/A  11/04/2019   Procedure: COLONOSCOPY WITH PROPOFOL;  Surgeon: Jonathon Bellows, MD;  Location: [facility] ENDOSCOPY;  Service: Gastroenterology;  Laterality: N/A;  . ESOPHAGOGASTRODUODENOSCOPY (EGD) WITH PROPOFOL N/A 10/31/2019   Procedure: ESOPHAGOGASTRODUODENOSCOPY (EGD) WITH PROPOFOL;  Surgeon: Virgel Manifold, MD;  Location: [facility] ENDOSCOPY;  Service: Endoscopy;  Laterality: N/A;  . PORTACATH PLACEMENT Right 12/11/2019   Procedure: INSERTION PORT-A-CATH;  Surgeon: Benjamine Sprague, DO;  Location: [facility] ORS;  Service: General;  Laterality: Right;     MEDICATIONS:  Prior to Admission medications   Medication Sig Start Date End Date Taking? Authorizing Provider  acetaminophen (TYLENOL) 325 MG tablet Take 650 mg by mouth every 4 (four) hours as needed for mild pain or moderate pain.   Yes [provider]  albuterol (PROVENTIL HFA;VENTOLIN HFA) 108 (90 Base) MCG/ACT inhaler Inhale 1 puff into the lungs every 3 (three) hours as needed for wheezing or shortness of breath.    Yes [provider]  ammonium lactate (LAC-HYDRIN) 12 % lotion Apply 1 application topically 2 (two) times daily. Apply topically to bilateral feet   Yes [provider]  ATIVAN 2 MG tablet Take 2 mg by mouth every 6 (six) hours as needed. 04/30/20  Yes [provider]  benztropine (COGENTIN) 0.5 MG tablet Take 0.5 mg by mouth 2 (two) times daily.   Yes [provider]  docusate sodium (COLACE) 100 MG capsule Take 100 mg by mouth 2 (two) times daily.   Yes [provider]  ferrous sulfate 325 (65 FE) MG tablet Take 1 tablet (  325 mg total) by mouth 2 (two) times daily with a meal. 11/08/19  Yes Memon, Jolaine Artist, MD  fluPHENAZine (PROLIXIN) 5 MG tablet Take 3 tablets (15 mg total) by mouth 2 (two) times daily. 12/11/19  Yes Sakai, Isami, DO  furosemide (LASIX) 20 MG tablet Take 1 tablet (20 mg total) by mouth 2 (two) times daily. 12/11/19 12/10/20 Yes Sakai, Isami, DO  hydrALAZINE (APRESOLINE) 50 MG  tablet Take 50 mg by mouth 4 (four) times daily. 04/30/20  Yes [provider]  hydrocortisone (CORTEF) 10 MG tablet Take 1 tablet (10 mg total) by mouth 2 (two) times daily. 12/30/17  Yes Fritzi Mandes, MD  hydroxychloroquine (PLAQUENIL) 200 MG tablet Take 200 mg by mouth 2 (two) times daily.   Yes [provider]  levothyroxine (SYNTHROID) 200 MCG tablet Take 200 mcg by mouth daily before breakfast.    Yes [provider]  lidocaine-prilocaine (EMLA) cream Apply 1 application topically as needed. To port a cath site - 1 hour prior to infusion 12/10/19  Yes Brahmanday, Elisha [place]land, MD  methocarbamol (ROBAXIN) 750 MG tablet Take 750 mg by mouth at bedtime.   Yes [provider]  metoprolol tartrate (LOPRESSOR) 50 MG tablet Take 1 tablet (50 mg total) by mouth 2 (two) times daily. Patient taking differently: Take 75 mg by mouth 2 (two) times daily.  07/28/19  Yes Bettey Costa, MD  Multiple Vitamins-Minerals (MULTIVITAMIN WITH MINERALS) tablet Take 1 tablet by mouth daily.   Yes [provider]  pantoprazole (PROTONIX) 40 MG tablet Take 1 tablet (40 mg total) by mouth daily. Patient taking differently: Take 40 mg by mouth every morning.  11/09/19  Yes Kathie Dike, MD  Polyethylene Glycol 3350 (MIRALAX PO) Take 17 g by mouth daily.   Yes [provider]  potassium chloride (KLOR-CON) 10 MEQ tablet Take 10 mEq by mouth daily. Open contents and sprinkle in applesauce or pudding   Yes [provider]  promethazine (PHENERGAN) 12.5 MG tablet Take 12.5 mg by mouth every 6 (six) hours as needed for nausea or vomiting.   Yes [provider]  traMADol (ULTRAM) 50 MG tablet Take 1 tablet (50 mg total) by mouth every 6 (six) hours as needed for moderate pain or severe pain. Patient taking differently: Take 50 mg by mouth every 4 (four) hours as needed for moderate pain or severe pain.  11/20/19  Yes Amin, Jeanella Flattery, MD  Amino Acids-Protein  Hydrolys (FEEDING SUPPLEMENT, PRO-STAT SUGAR FREE 64,) LIQD Take 30 mLs by mouth 3 (three) times daily with meals. Sugar free    [provider]  guaifenesin (ROBITUSSIN) 100 MG/5ML syrup Take 100 mg by mouth every 4 (four) hours as needed for cough. Patient not taking: Reported on 06/15/2020    [provider]  tamsulosin (FLOMAX) 0.4 MG CAPS capsule Take 0.4 mg by mouth daily after breakfast.  Patient not taking: Reported on 06/15/2020    [provider]  vitamin C (ASCORBIC ACID) 250 MG tablet Take 500 mg by mouth 2 (two) times daily.  Patient not taking: Reported on 06/15/2020    [provider]     ALLERGIES:  Allergies  Allergen Reactions  . Methadone   . Penicillins     Tolerates cephalosporins   . Propoxyphene   . Sulfa Antibiotics   . Valproic Acid And Related     Thrombocytopenia     SOCIAL HISTORY:  Social History   Socioeconomic History  . Marital status: Single  Spouse name: Not on file  . Number of children: Not on file  . Years of education: Not on file  . Highest education level: Not on file  Occupational History  . Not on file  Tobacco Use  . Smoking status: Never Smoker  . Smokeless tobacco: Never Used  Vaping Use  . Vaping Use: Never used  Substance and Sexual Activity  . Alcohol use: No  . Drug use: No  . Sexual activity: Not Currently    Birth control/protection: None  Other Topics Concern  . Not on file  Social History Narrative   Resident at [facility].   Social Determinants of Health   Financial Resource Strain: Unknown  . Difficulty of Paying Living Expenses: Patient refused  Food Insecurity: Unknown  . Worried About Charity fundraiser in the Last Year: Patient refused  . Ran Out of Food in the Last Year: Patient refused  Transportation Needs: No Transportation Needs  . Lack of Transportation (Medical): No  . Lack of Transportation (Non-Medical): No  Physical Activity: Unknown  . Days of  Exercise per Week: Patient refused  . Minutes of Exercise per Session: Patient refused  Stress: No Stress Concern Present  . Feeling of Stress : Only a little  Social Connections: Unknown  . Frequency of Communication with Friends and Family: Patient refused  . Frequency of Social Gatherings with Friends and Family: Patient refused  . Attends Religious Services: Patient refused  . Active Member of Clubs or Organizations: Patient refused  . Attends Archivist Meetings: Patient refused  . Marital Status: Patient refused  Intimate Partner Violence: Unknown  . Fear of Current or Ex-Partner: Patient refused  . Emotionally Abused: Patient refused  . Physically Abused: Patient refused  . Sexually Abused: Patient refused     FAMILY HISTORY:  Family History  Family history unknown: Yes      REVIEW OF SYSTEMS:  Review of Systems  Unable to perform ROS: Mental acuity    VITAL SIGNS:  Temp:  [97.7 F (36.5 C)-98.2 F (36.8 C)] 97.7 F (36.5 C) (07/20 0507) Pulse Rate:  [67-93] 93 (07/20 0507) Resp:  [13-18] 18 (07/20 0507) BP: (108-120)/(70-77) 120/77 (07/20 0507) SpO2:  [100 %] 100 % (07/20 0507) Weight:  [122.9 kg] 122.9 kg (07/20 0507)     Height: 6\' 2"  (188 cm) Weight: 122.9 kg BMI (Calculated): 34.77   INTAKE/OUTPUT:  No intake/output data recorded.  PHYSICAL EXAM:  Physical Exam Vitals and nursing note reviewed.  Constitutional:      General: He is not in acute distress.    Appearance: He is well-developed. He is obese. He is not ill-appearing.     Comments: Patient resting comfortably, asking tangential questions  Eyes:     General: No scleral icterus.    Extraocular Movements: Extraocular movements intact.  Cardiovascular:     Rate and Rhythm: Normal rate and regular rhythm.     Heart sounds: Normal heart sounds. No murmur heard.   Pulmonary:     Effort: Pulmonary effort is normal. No respiratory distress.     Breath sounds: Normal breath sounds.      Comments: On [place] Abdominal:     General: Abdomen is protuberant. There is no distension.     Palpations: Abdomen is soft.     Tenderness: There is no abdominal tenderness. There is no guarding or rebound.     Comments: Abdomen is morbidly obese, I am unable to illicit any tenderness on my examination  but again this is challenging given his mental acuity, no rebound/guarding, no evidence of peritonitis   Genitourinary:    Comments: Deferred Skin:    General: Skin is warm and dry.     Coloration: Skin is not jaundiced or pale.  Neurological:     Mental Status: He is alert.     Comments: Unable to reliably assess  Psychiatric:     Comments: Unable to reliably assess, history of schizophrenia      Labs:  CBC Latest Ref Rng & Units 06/15/2020 06/15/2020 11/20/2019  WBC 4.0 - 10.5 K/uL 19.4(H) 24.4(H) 6.6  Hemoglobin 13.0 - 17.0 g/dL 9.1(L) 9.7(L) 8.3(L)  Hematocrit 39 - 52 % 28.1(L) 30.2(L) 28.3(L)  Platelets 150 - 400 K/uL 140(L) 155 271   CMP Latest Ref Rng & Units 06/15/2020 06/15/2020 11/20/2019  Glucose 70 - 99 mg/dL 53(L) 65(L) 110(H)  BUN 8 - 23 mg/dL 26(H) 25(H) 9  Creatinine 0.61 - 1.24 mg/dL 1.22 1.44(H) 0.42(L)  Sodium 135 - 145 mmol/L 141 140 139  Potassium 3.5 - 5.1 mmol/L 3.4(L) 3.4(L) 3.9  Chloride 98 - 111 mmol/L 102 101 101  CO2 22 - 32 mmol/L 28 30 31   Calcium 8.9 - 10.3 mg/dL 8.4(L) 8.1(L) 8.3(L)  Total Protein 6.5 - 8.1 g/dL - 7.3 -  Total Bilirubin 0.3 - 1.2 mg/dL - 0.7 -  Alkaline Phos 38 - 126 U/L - 90 -  AST 15 - 41 U/L - 25 -  ALT 0 - 44 U/L - 16 -     Imaging studies:   CT Abdomen/Pelvis (06/15/2020) personally reviewed showing thickening of ascending colon consistent with known mass, small focus of contained extraluminal air, and gas distally so I do not think this is obstructed, and radiologist report reviewed:  IMPRESSION: 1. No evidence for small bowel obstruction. 2. There is a 5.5 cm mass involving the ascending colon, significantly increased in  size from prior study. This mass is favored to represent the patient's reported colorectal carcinoma. There is a questionable focus of extraluminal gas adjacent to this mass which may indicate underlying micro perforation. There are mildly enlarged regional lymph nodes concerning for developing nodal metastatic disease. 3. There is cholelithiasis without secondary signs of acute cholecystitis. 4. Additional chronic findings as detailed above.    Assessment/Plan: (ICD-10's: C19) 64 y.o. male with known ascending colon cancer who presented with nausea, emesis, and diarrhea who is unfortunately a poor candidate for surgery nor chemotherapy and has been followed with palliative care as an outpatient.   - If nausea, emesis resolved I am okay with sips of clear liquids. He is having diarrhea and there is air in the colon distally, so I do not think this is a completely obstruction mass.   - Again, he is a poor surgical candidate and resection is not recommended  - pain control prn; antiemetics prn  - Monitor abdominal examination; on-going bowel function  - can consider serial KUBs if needed given lack of reliable input subjectively from patient  - agree with continuation of palliative services  - further management per primary service; we can follow for now    All of the above findings and recommendations were discussed with the medical team.   Thank you for the opportunity to participate in this patient's care.   -- Edison Simon, PA-C [facility] 06/15/2020, 7:16 AM [phone] M-F: 7am - 4pm

## 2020-06-15 NOTE — ED Provider Notes (Signed)
[facility] Emergency Department Provider Note   ____________________________________________   First MD Initiated Contact with Patient 06/15/20 [phone]     (approximate)  I have reviewed the triage vital signs and the nursing notes.   HISTORY  Chief Complaint Emesis and Abdominal Pain  Level V caveat: History limited by schizophrenia  HPI Jerry [name] is a 64 y.o. male brought to the ED via EMS [facility] for abdominal x-rays demonstrating mild ileus.  Patient had nausea/vomiting/diarrhea over the weekend x2 days.  None yesterday.  Facility took abdominal x-rays which demonstrated mild ileus.  Patient reports some generalized abdominal discomfort.  Denies fever, cough, chest pain, shortness of breath, dysuria.  Patient is on continuous oxygenation for COPD and CHF.  Contractures to BLE.  Unsure if he has received COVID-19 vaccination.      Past Medical History:  Diagnosis Date  . BPH (benign prostatic hyperplasia)   . CHF (congestive heart failure) ([place])   . Chronic pain   . COPD (chronic obstructive pulmonary disease) ([place])   . Hypertension   . Morbid obesity ([place])   . Pressure ulcer   . Rheumatoid arthritis ([place])   . Schizophrenia ([place])   . Thyroid disease     Patient Active Problem List   Diagnosis Date Noted  . Abdominal pain 06/15/2020  . Iron deficiency anemia due to chronic blood loss 12/10/2019  . Colon adenocarcinoma ([place]) 11/08/2019  . DNR (do not resuscitate) 11/08/2019  . Chronic diastolic CHF (congestive heart failure) ([place]) 11/08/2019  . Rheumatoid arthritis ([place]) 11/08/2019  . Chronic respiratory failure with hypoxia ([place]) 11/08/2019  . Palliative care encounter   . Mass of colon 11/04/2019  . Schizo affective schizophrenia ([place])   . 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 Primary adrenocortical insufficiency ([place]) 10/30/2019  . Anemia 10/30/2019  . Gastrointestinal hemorrhage   . Chronic  obstructive pulmonary disease ([place])   . COVID-19 virus infection 09/11/2019  . Normocytic anemia 08/25/2019  . Community acquired pneumonia   . DNR (do not resuscitate) discussion   . SOB (shortness of breath) 07/16/2019  . Obesity, Class III, BMI 40-49.9 (morbid obesity) ([place])   . Schizophrenia ([place])   . Palliative care by specialist   . Advance care planning   . Goals of care, counseling/discussion   . CHF (congestive heart failure) ([place])   . Altered mental status   . Septic shock ([place]) 08/02/2017  . Pneumonia 06/21/2017  . Thrombocytopenia ([place]) 06/21/2017  . Leukopenia 06/21/2017  . Pressure injury of skin 06/21/2017    Past Surgical History:  Procedure Laterality Date  . COLONOSCOPY WITH PROPOFOL N/A 11/02/2019   Procedure: COLONOSCOPY WITH PROPOFOL;  Surgeon: Lucilla Lame, MD;  Location[facility] ENDOSCOPY;  Service: Endoscopy;  Laterality: N/A;  . COLONOSCOPY WITH PROPOFOL N/A 11/04/2019   Procedure: COLONOSCOPY WITH PROPOFOL;  Surgeon: Jonathon Bellows, MD;  Location: [facility] ENDOSCOPY;  Service: Gastroenterology;  Laterality: N/A;  . ESOPHAGOGASTRODUODENOSCOPY (EGD) WITH PROPOFOL N/A 10/31/2019   Procedure: ESOPHAGOGASTRODUODENOSCOPY (EGD) WITH PROPOFOL;  Surgeon: Virgel Manifold, MD;  Location: [facility] ENDOSCOPY;  Service: Endoscopy;  Laterality: N/A;  . PORTACATH PLACEMENT Right 12/11/2019   Procedure: INSERTION PORT-A-CATH;  Surgeon: Benjamine Sprague, DO;  Location: [facility] ORS;  Service: General;  Laterality: Right;    Prior to Admission medications   Medication Sig Start Date End Date Taking? Authorizing Provider  acetaminophen (TYLENOL) 325 MG tablet Take 650 mg by mouth every 4 (four) hours as needed for mild pain  or moderate pain.   Yes [provider]  albuterol (PROVENTIL HFA;VENTOLIN HFA) 108 (90 Base) MCG/ACT inhaler Inhale 1 puff into the lungs every 3 (three) hours as needed for wheezing or shortness of breath.    Yes [provider]  ammonium lactate  (LAC-HYDRIN) 12 % lotion Apply 1 application topically 2 (two) times daily. Apply topically to bilateral feet   Yes [provider]  ATIVAN 2 MG tablet Take 2 mg by mouth every 6 (six) hours as needed. 04/30/20  Yes [provider]  benztropine (COGENTIN) 0.5 MG tablet Take 0.5 mg by mouth 2 (two) times daily.   Yes [provider]  docusate sodium (COLACE) 100 MG capsule Take 100 mg by mouth 2 (two) times daily.   Yes [provider]  ferrous sulfate 325 (65 FE) MG tablet Take 1 tablet (325 mg total) by mouth 2 (two) times daily with a meal. 11/08/19  Yes Memon, Jolaine Artist, MD  fluPHENAZine (PROLIXIN) 5 MG tablet Take 3 tablets (15 mg total) by mouth 2 (two) times daily. 12/11/19  Yes Sakai, Isami, DO  furosemide (LASIX) 20 MG tablet Take 1 tablet (20 mg total) by mouth 2 (two) times daily. 12/11/19 12/10/20 Yes Sakai, Isami, DO  hydrALAZINE (APRESOLINE) 50 MG tablet Take 50 mg by mouth 4 (four) times daily. 04/30/20  Yes [provider]  hydrocortisone (CORTEF) 10 MG tablet Take 1 tablet (10 mg total) by mouth 2 (two) times daily. 12/30/17  Yes Fritzi Mandes, MD  hydroxychloroquine (PLAQUENIL) 200 MG tablet Take 200 mg by mouth 2 (two) times daily.   Yes [provider]  levothyroxine (SYNTHROID) 200 MCG tablet Take 200 mcg by mouth daily before breakfast.    Yes [provider]  lidocaine-prilocaine (EMLA) cream Apply 1 application topically as needed. To port a cath site - 1 hour prior to infusion 12/10/19  Yes Brahmanday, Elisha Headland, MD  methocarbamol (ROBAXIN) 750 MG tablet Take 750 mg by mouth at bedtime.   Yes [provider]  metoprolol tartrate (LOPRESSOR) 50 MG tablet Take 1 tablet (50 mg total) by mouth 2 (two) times daily. Patient taking differently: Take 75 mg by mouth 2 (two) times daily.  07/28/19  Yes Bettey Costa, MD  Multiple Vitamins-Minerals (MULTIVITAMIN WITH MINERALS) tablet Take 1 tablet by mouth daily.   Yes [provider]  pantoprazole (PROTONIX) 40 MG tablet Take 1 tablet (40 mg total) by mouth daily. Patient taking differently: Take 40 mg by mouth every morning.  11/09/19  Yes Kathie Dike, MD  Polyethylene Glycol 3350 (MIRALAX PO) Take 17 g by mouth daily.   Yes [provider]  potassium chloride (KLOR-CON) 10 MEQ tablet Take 10 mEq by mouth daily. Open contents and sprinkle in applesauce or pudding   Yes [provider]  promethazine (PHENERGAN) 12.5 MG tablet Take 12.5 mg by mouth every 6 (six) hours as needed for nausea or vomiting.   Yes [provider]  traMADol (ULTRAM) 50 MG tablet Take 1 tablet (50 mg total) by mouth every 6 (six) hours as needed for moderate pain or severe pain. Patient taking differently: Take 50 mg by mouth every 4 (four) hours as needed for moderate pain or severe pain.  11/20/19  Yes Amin, Jeanella Flattery, MD  Amino Acids-Protein Hydrolys (FEEDING SUPPLEMENT, PRO-STAT SUGAR FREE 64,) LIQD Take 30 mLs by mouth 3 (three) times daily with meals. Sugar free    [provider]  guaifenesin (ROBITUSSIN) 100 MG/5ML syrup Take  100 mg by mouth every 4 (four) hours as needed for cough. Patient not taking: Reported on 06/15/2020    [provider]  tamsulosin (FLOMAX) 0.4 MG CAPS capsule Take 0.4 mg by mouth daily after breakfast.  Patient not taking: Reported on 06/15/2020    [provider]  vitamin C (ASCORBIC ACID) 250 MG tablet Take 500 mg by mouth 2 (two) times daily.  Patient not taking: Reported on 06/15/2020    [provider]    Allergies Methadone, Penicillins, Propoxyphene, Sulfa antibiotics, and Valproic acid and related  Family History  Family history unknown: Yes    Social History Social History   Tobacco Use  . Smoking status: Never Smoker  . Smokeless tobacco: Never Used  Vaping Use  . Vaping Use: Never used  Substance Use Topics  . Alcohol use: No  . Drug use: No    Review of  Systems  Constitutional: No fever/chills Eyes: No visual changes. ENT: No sore throat. Cardiovascular: Denies chest pain. Respiratory: Denies shortness of breath. Gastrointestinal: Positive for abdominal pain, nausea, vomiting and diarrhea.  No constipation. Genitourinary: Negative for dysuria. Musculoskeletal: Negative for back pain. Skin: Negative for rash. Neurological: Negative for headaches, focal weakness or numbness.   ____________________________________________   PHYSICAL EXAM:  VITAL SIGNS: ED Triage Vitals [06/15/20 0040]  Enc Vitals Group     BP 119/70     Pulse Rate 89     Resp 18     Temp 98.2 F (36.8 C)     Temp Source Rectal     SpO2 100 %     Weight      Height      Head Circumference      [facility]      Pain Score      Pain Loc      Pain Edu?      Excl. in [place]?     Constitutional: Alert and oriented.  Chronically ill appearing and in mild acute distress. Eyes: Conjunctivae are normal. PERRL. EOMI. Head: Atraumatic. Nose: No congestion/rhinnorhea. Mouth/Throat: Mucous membranes are mildly dry.   Neck: No stridor.   Cardiovascular: Normal rate, regular rhythm. Grossly normal heart sounds.  Good peripheral circulation. Respiratory: Normal respiratory effort.  No retractions. Lungs CTAB. Gastrointestinal: Obese.  Soft and mildly diffusely tender to palpation without rebound or guarding. No distention. No abdominal bruits. No CVA tenderness. Musculoskeletal: Contractures all extremities.   Neurologic:  Normal speech and language. No gross focal neurologic deficits are appreciated.  Skin:  Skin is warm, dry and intact. No rash noted. Psychiatric: Mood and affect are flat. Speech and behavior are normal.  ____________________________________________   LABS (all labs ordered are listed, but only abnormal results are displayed)  Labs Reviewed  CBC WITH DIFFERENTIAL/PLATELET - Abnormal; Notable for the following components:      Result Value   WBC  24.4 (*)    RBC 3.62 (*)    Hemoglobin 9.7 (*)    HCT 30.2 (*)    RDW 16.4 (*)    Neutro Abs 19.2 (*)    Monocytes Absolute 2.1 (*)    Basophils Absolute 0.2 (*)    Abs Immature Granulocytes 1.84 (*)    All other components within normal limits  COMPREHENSIVE METABOLIC PANEL - Abnormal; Notable for the following components:   Potassium 3.4 (*)    Glucose, Bld 65 (*)    BUN 25 (*)    Creatinine, Ser 1.44 (*)    Calcium 8.1 (*)  Albumin 2.7 (*)    GFR calc non Af Amer 51 (*)    GFR calc Af Amer 59 (*)    All other components within normal limits  URINALYSIS, COMPLETE (UACMP) WITH MICROSCOPIC - Abnormal; Notable for the following components:   Color, Urine AMBER (*)    APPearance CLOUDY (*)    Bilirubin Urine SMALL (*)    Protein, ur 30 (*)    Leukocytes,Ua SMALL (*)    All other components within normal limits  TROPONIN I (HIGH SENSITIVITY) - Abnormal; Notable for the following components:   Troponin I (High Sensitivity) 26 (*)    All other components within normal limits  SARS CORONAVIRUS 2 BY RT PCR (HOSPITAL ORDER, [place])  URINE CULTURE  CULTURE, BLOOD (ROUTINE X 2)  CULTURE, BLOOD (ROUTINE X 2)  LIPASE, BLOOD  LACTIC ACID, PLASMA  LACTIC ACID, PLASMA  PROTIME-INR  CORTISOL-AM, BLOOD  PROCALCITONIN  BASIC METABOLIC PANEL  CBC  TROPONIN I (HIGH SENSITIVITY)   ____________________________________________  EKG  ED ECG REPORT I, Raha Tennison J, the attending physician, personally viewed and interpreted this ECG.   Date: 06/15/2020  EKG Time: 0042  Rate: 90  Rhythm: normal EKG, normal sinus rhythm  Axis: Normal  Intervals:none  ST&T Change: Nonspecific  ____________________________________________  RADIOLOGY  ED MD interpretation: No acute cardiopulmonary process; no SBO, enlarging colon mass with questionable microperforation, cholelithiasis  Official radiology report(s): CT Abdomen Pelvis Wo Contrast  Result Date:  06/15/2020 CLINICAL DATA:  Ileus. EXAM: CT ABDOMEN AND PELVIS WITHOUT CONTRAST TECHNIQUE: Multidetector CT imaging of the abdomen and pelvis was performed following the standard protocol without IV contrast. COMPARISON:  November 07, 2019 FINDINGS: Lower chest: There are fibrotic changes at the lung bases bilaterally.The heart size is normal. Hepatobiliary: The liver is normal. Cholelithiasis without acute inflammation.There is no biliary ductal dilation. Pancreas: Normal contours without ductal dilatation. No peripancreatic fluid collection. Spleen: Unremarkable. Adrenals/Urinary Tract: --Adrenal glands: Unremarkable. --Right kidney/ureter: No hydronephrosis or radiopaque kidney stones. --Left kidney/ureter: No hydronephrosis or radiopaque kidney stones. --Urinary bladder: Unremarkable. Stomach/Bowel: --Stomach/Duodenum: No hiatal hernia or other gastric abnormality. Normal duodenal course and caliber. --Small bowel: There are few mildly dilated loops of small bowel in the abdomen without evidence for high-grade obstruction. --Colon: There are few air-fluid levels in the right hemicolon. There is a mass involving the ascending colon measuring approximately 5.5 cm in length (coronal series 5, image 39). There are surrounding inflammatory changes and mildly enlarged regional lymph nodes. There is a questionable pocket extraluminal gas adjacent to this mass (axial series 2, image 49). --Appendix: Not visualized. No right lower quadrant inflammation or free fluid. Vascular/Lymphatic: Atherosclerotic calcification is present within the non-aneurysmal abdominal aorta, without hemodynamically significant stenosis. --No retroperitoneal lymphadenopathy. --No mesenteric lymphadenopathy. --No pelvic or inguinal lymphadenopathy. Reproductive: Unremarkable Other: No ascites or free air. The abdominal wall is normal. Musculoskeletal. There are end-stage degenerative changes both hips. IMPRESSION: 1. No evidence for small bowel  obstruction. 2. There is a 5.5 cm mass involving the ascending colon, significantly increased in size from prior study. This mass is favored to represent the patient's reported colorectal carcinoma. There is a questionable focus of extraluminal gas adjacent to this mass which may indicate underlying micro perforation. There are mildly enlarged regional lymph nodes concerning for developing nodal metastatic disease. 3. There is cholelithiasis without secondary signs of acute cholecystitis. 4. Additional chronic findings as detailed above. Aortic Atherosclerosis (ICD10-I70.0). Electronically Signed   By: Jamie Kato.D.  On: 06/15/2020 02:00   DG Chest Port 1 View  Result Date: 06/15/2020 CLINICAL DATA:  Chronic shortness of breath EXAM: PORTABLE CHEST 1 VIEW COMPARISON:  December 11, 2019 FINDINGS: The heart size is stable but enlarged. There is a stable right-sided Port-A-Cath. There are end-stage degenerative changes of both glenohumeral joints. Chronic interstitial lung markings are again noted bilaterally. The there is no pneumothorax. There is no definite focal infiltrate. IMPRESSION: 1. No acute cardiopulmonary process. 2. Again noted are findings of pulmonary fibrosis without evidence for a focal infiltrate. 3. Stable positioning of the right-sided Port-A-Cath. 4. Cardiomegaly. Electronically Signed   By: Constance Holster M.D.   On: 06/15/2020 01:14    ____________________________________________   PROCEDURES  Procedure(s) performed (including Critical Care):  .1-3 Lead EKG Interpretation Performed by: Paulette Blanch, MD Authorized by: Paulette Blanch, MD     Interpretation: normal     ECG rate:  90   ECG rate assessment: normal     Rhythm: sinus rhythm     Ectopy: none     Conduction: normal   Comments:     Patient placed on cardiac monitor to evaluate for arrhythmias    CRITICAL CARE Performed by: Paulette Blanch   Total critical care time: 30 minutes  Critical care time  was exclusive of separately billable procedures and treating other patients.  Critical care was necessary to treat or prevent imminent or life-threatening deterioration.  Critical care was time spent personally by me on the following activities: development of treatment plan with patient and/or surrogate as well as nursing, discussions with consultants, evaluation of patient's response to treatment, examination of patient, obtaining history from patient or surrogate, ordering and performing treatments and interventions, ordering and review of laboratory studies, ordering and review of radiographic studies, pulse oximetry and re-evaluation of patient's condition.  ____________________________________________   INITIAL IMPRESSION / ASSESSMENT AND PLAN / ED COURSE  As part of my medical decision making, I reviewed the following data within the [place] notes reviewed and incorporated, Labs reviewed, EKG interpreted, Old chart reviewed, Radiograph reviewed, Discussed with admitting physician and Notes from prior ED visits     Jerry [name] was evaluated in Emergency Department on 06/15/2020 for the symptoms described in the history of present illness. He was evaluated in the context of the global COVID-19 pandemic, which necessitated consideration that the patient might be at risk for infection with the SARS-CoV-2 virus that causes COVID-19. Institutional protocols and algorithms that pertain to the evaluation of patients at risk for COVID-19 are in a state of rapid change based on information released by regulatory bodies including the CDC and federal and state organizations. These policies and algorithms were followed during the patient's care in the ED.    64 year old male with N/V/D over the weekend sent for mild ileus seen on abdominal x-rays. Differential diagnosis includes, but is not limited to, acute appendicitis, renal colic, testicular torsion, urinary tract  infection/pyelonephritis, prostatitis,  epididymitis, diverticulitis, small bowel obstruction or ileus, colitis, abdominal aortic aneurysm, gastroenteritis, hernia, etc.  Will obtain lab work, chest x-ray, CT scan.  Initiate IV fluid hydration, IV Zofran and reassess.   Clinical Course as of Jun 15 536  Tue Jun 15, 2020  0126 Noted leukocytosis.  Lactic acid normal.  Patient unable to drink oral contrast.  Will obtain CT scan without oral or IV contrast as patient is also exhibiting AKI.   [JS]  61 Spoke with general surgery Dr. Dahlia Byes who  agrees with IV antibiotics and keeping patient n.p.o.  Will discuss with hospitalist services for admission.   [JS]    Clinical Course User Index [JS] Paulette Blanch, MD     ____________________________________________   FINAL CLINICAL IMPRESSION(S) / ED DIAGNOSES  Final diagnoses:  Nausea vomiting and diarrhea  Generalized abdominal pain  Leukocytosis, unspecified type  Colorectal cancer ([place])  Lower urinary tract infectious disease     ED Discharge Orders    None       Note:  This document was prepared using Dragon voice recognition software and may include unintentional dictation errors.   Paulette Blanch, MD 06/15/20 [phone]

## 2020-06-15 NOTE — TOC Initial Note (Signed)
Transition of Care [facility]) - Initial/Assessment Note    Patient Details  Name: Jerry [name] MRN: [id number] Date of Birth: 1956/09/09  [facility] CM/SW Contact:    Shelbie Hutching, RN Phone Number: 06/15/2020, 3:17 PM  Clinical Narrative:                 Patient is from [facility] long term care.  Plan for discharge will be [facility]   Expected Discharge Plan: Skilled Nursing Facility Barriers to Discharge: Continued Medical Work up   Patient Goals and CMS Choice        Expected Discharge Plan and Services Expected Discharge Plan: [place]   Discharge Planning Services: CM Consult   Living arrangements for the past 2 months: [place]                                      Prior Living Arrangements/Services Living arrangements for the past 2 months: [place] Lives with:: Facility Resident Patient language and need for interpreter reviewed:: Yes Do you feel safe going back to the place where you live?: Yes      Need for Family Participation in Patient Care: No (Comment)     Criminal Activity/Legal Involvement Pertinent to Current Situation/Hospitalization: No - Comment as needed  Activities of Daily Living      Permission Sought/Granted Permission sought to share information with : Case Manager, Customer service manager, Family Supports Permission granted to share information with : Yes, Verbal Permission Granted  Share Information with NAME: [name]  Legal guardian  Permission granted to share info w AGENCY: [facility]  Permission granted to share info w Relationship: legal guardian     Emotional Assessment       Orientation: : Oriented to Self Alcohol / Substance Use: Not Applicable Psych Involvement: No (comment)  Admission diagnosis:  Lower urinary tract infectious disease [N39.0] Colorectal cancer ([place]) [C19] Generalized abdominal pain  [R10.84] Nausea vomiting and diarrhea [R11.2, R19.7] Abdominal pain [R10.9] Leukocytosis, unspecified type [D72.829] Patient Active Problem List   Diagnosis Date Noted  . Abdominal pain 06/15/2020  . Iron deficiency anemia due to chronic blood loss 12/10/2019  . Colon adenocarcinoma ([place]) 11/08/2019  . DNR (do not resuscitate) 11/08/2019  . Chronic diastolic CHF (congestive heart failure) ([place]) 11/08/2019  . Rheumatoid arthritis ([place]) 11/08/2019  . Chronic respiratory failure with hypoxia ([place]) 11/08/2019  . Palliative care encounter   . Mass of colon 11/04/2019  . Schizo affective schizophrenia ([place])   . 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 ([place]) 10/30/2019  . Primary adrenocortical insufficiency ([place]) 10/30/2019  . Anemia 10/30/2019  . Gastrointestinal hemorrhage   . Chronic obstructive pulmonary disease ([place])   . COVID-19 virus infection 09/11/2019  . Normocytic anemia 08/25/2019  . Community acquired pneumonia   . DNR (do not resuscitate) discussion   . SOB (shortness of breath) 07/16/2019  . Obesity, Class III, BMI 40-49.9 (morbid obesity) ([place])   . Schizophrenia ([place])   . Palliative care by specialist   . Advance care planning   . Goals of care, counseling/discussion   . CHF (congestive heart failure) ([place])   . Altered mental status   . Septic shock ([place]) 08/02/2017  . Pneumonia 06/21/2017  . Thrombocytopenia ([place]) 06/21/2017  . Leukopenia 06/21/2017  . Pressure injury of skin 06/21/2017   PCP:  [name], MD  Pharmacy:  No Pharmacies Listed    Social Determinants of Health (SDOH) Interventions    Readmission Risk Interventions Readmission Risk Prevention Plan 11/20/2019 11/03/2019  Transportation Screening Complete Complete  HRI or Home Care Consult - Not Complete  HRI or Home Care Consult comments - Pt lives at a [place] for [place] Planning/Counseling - [place] - Not Applicable  Medication  Review Press photographer) Complete Complete  PCP or Specialist appointment within 3-5 days of discharge (No Data) -  Palliative Care Screening Complete -  [place] Complete -  Some recent data might be hidden

## 2020-06-15 NOTE — ED Triage Notes (Signed)
Pt arrived via EMS from [facility] due to previous x-ray showing mild ileus with N/V/D x2 days. MD at bedside.

## 2020-06-16 DIAGNOSIS — Z7189 Other specified counseling: Secondary | ICD-10-CM

## 2020-06-16 DIAGNOSIS — Z515 Encounter for palliative care: Secondary | ICD-10-CM | POA: Diagnosis not present

## 2020-06-16 LAB — BASIC METABOLIC PANEL
Anion gap: 8 (ref 5–15)
BUN: 23 mg/dL (ref 8–23)
CO2: 26 mmol/L (ref 22–32)
Calcium: 8.1 mg/dL — ABNORMAL LOW (ref 8.9–10.3)
Chloride: 105 mmol/L (ref 98–111)
Creatinine, Ser: 0.63 mg/dL (ref 0.61–1.24)
GFR calc Af Amer: >60 mL/min (ref >60)
GFR calc non Af Amer: >60 mL/min (ref >60)
Glucose, Bld: 90 mg/dL (ref 70–99)
Potassium: 3.3 mmol/L — ABNORMAL LOW (ref 3.5–5.1)
Sodium: 139 mmol/L (ref 135–145)

## 2020-06-16 LAB — CBC
HCT: 27.9 % — ABNORMAL LOW (ref 39.0–52.0)
Hemoglobin: 8.8 g/dL — ABNORMAL LOW (ref 13.0–17.0)
MCH: 26.5 pg (ref 26.0–34.0)
MCHC: 31.5 g/dL (ref 30.0–36.0)
MCV: 84 fL (ref 80.0–100.0)
Platelets: 138 10*3/uL — ABNORMAL LOW (ref 150–400)
RBC: 3.32 MIL/uL — ABNORMAL LOW (ref 4.22–5.81)
RDW: 16.6 % — ABNORMAL HIGH (ref 11.5–15.5)
WBC: 15.9 10*3/uL — ABNORMAL HIGH (ref 4.0–10.5)
nRBC: 0 % (ref 0.0–0.2)

## 2020-06-16 LAB — T4, FREE: Free T4: 0.9 ng/dL (ref 0.61–1.12)

## 2020-06-16 LAB — PROCALCITONIN: Procalcitonin: 28.46 ng/mL

## 2020-06-16 MED ORDER — CHLORHEXIDINE GLUCONATE CLOTH 2 % EX PADS
6.0000 | MEDICATED_PAD | Freq: Every day | CUTANEOUS | Status: DC
  Administered 2020-06-16 – 2020-06-28 (×13): 6 via TOPICAL

## 2020-06-16 MED ORDER — VANCOMYCIN HCL 1250 MG/250ML IV SOLN
1250.0000 mg | Freq: Two times a day (BID) | INTRAVENOUS | Status: DC
  Filled 2020-06-16: qty 250

## 2020-06-16 NOTE — Progress Notes (Signed)
[facility] Liaison note:  Patient is currently followed by Manufacturing engineer community Palliative at [facility]. TOC Doran Clay made aware. Will follow regarding any discharge needs. Thank you.  Flo Shanks BSN, RN, [place] collective [phone]

## 2020-06-16 NOTE — Progress Notes (Addendum)
Pharmacy Antibiotic Note  Jerry [name] is a 64 y.o. male admitted on 06/15/2020 with [facility].  Pharmacy has been consulted for Vancomycin dosing. Scr improved from 1.44>>0.63 from original dosing.   Plan: Change from Vancomycin 1750 mg IV Q 24 hrs to Vancomycin 1250 mg IV Q 12 hrs.  SCr used: 0.63  Follow-up with vancomycin trough if therapy is continued after 4th or 5th dose.  Height: 6\' 2"  (188 cm) Weight: 122.9 kg (270 lb 15.1 oz) IBW/kg (Calculated) : 82.2  Temp (24hrs), Avg:99.2 F (37.3 C), Min:98.3 F (36.8 C), Max:99.8 F (37.7 C)  Recent Labs  Lab 06/15/20 0048 06/15/20 0558 06/16/20 0420  WBC 24.4* 19.4* 15.9*  CREATININE 1.44* 1.22 0.63  LATICACIDVEN 1.2 1.2  --     Estimated Creatinine Clearance: 130 mL/min (by C-G formula based on SCr of 0.63 mg/dL).    Allergies  Allergen Reactions  . Methadone   . Penicillins     Tolerates cephalosporins   . Propoxyphene   . Sulfa Antibiotics   . Valproic Acid And Related     Thrombocytopenia    Antimicrobials this admission: 7/20 meropenem >> 7/20 vancomycin >>  Dose adjustments this admission: Vancomycin 1750 mg IV Q 24 hrs to Vancomycin 1250 mg IV Q 12 hrs  Microbiology results:  BCx: NGTD  UCx: + 60,000 colonies/mL gram negative rods  Sputum: N/A  MRSA PCR: N/A  Thank you for allowing pharmacy to be a part of this patient's care.  Benn Moulder, PharmD Pharmacy Resident  06/16/2020 9:30 AM

## 2020-06-16 NOTE — Consult Note (Signed)
Consultation Note Date: 06/16/2020   Patient Name: Jerry [name]  DOB: January 06, 1956  MRN: [id number]  Age / Sex: 64 y.o., male  PCP: [name], MD Referring Physician: Lorella Nimrod, MD  Reason for Consultation: Establishing goals of care  HPI/Patient Profile: Jerry [name]  is a 64 y.o. male with a known history of BPH, CHF, COPD, hypertension, colorectal carcinoma and rheumatoid arthritis, presented to the emergency room with acute onset of nausea without significant vomiting as well as diarrhea and abdominal pain.   Clinical Assessment and Goals of Care:  Patient is resting in bed. He is able to tell me where he is and states he feels like he is doing okay. He does not answer other questions as he perseverates on living at [facility]. He requests a spit cup for phlegm, and water.   Spoke with his court appointed legal guardian Jann Ra. She requests to have her name hyphenated Rasheed-Ray.   We discussed his diagnosis, prognosis, GOC, EOL wishes disposition and options.  A detailed discussion was had today regarding advanced directives.  Concepts specific to code status, artifical feeding and hydration, IV antibiotics and rehospitalization were discussed.  The difference between an aggressive medical intervention path and a comfort care path was discussed.  Values and goals of care important to patient and family were attempted to be elicited.  Discussed limitations of medical interventions to prolong quality of life in some situations and discussed the concept of human mortality.  She states she has been kept updated and understands his status. She states she would like to continue to treat the treatable for now. She states she has been advised a feeding tube would not be a good idea, and agrees with this as she understands the ease in which the tube could be pulled out by him. She states she would  not want a breathing tube placed, or to have him placed on a ventilator due to his COPD. She would not want CPR as she states she was advised previously, and agrees, it would not be a good idea to "jump up and down on his chest" or to "shock him". She states she would like him to die peacefully and naturally.       I completed a MOST form today and the signed original was placed in the chart. A photocopy was placed in the chart to be scanned into EMR. The patient outlined their wishes for the following treatment decisions:  Cardiopulmonary Resuscitation: Do Not Attempt Resuscitation (DNR/No CPR)  Medical Interventions: Limited Additional Interventions: Use medical treatment, IV fluids and cardiac monitoring as indicated, DO NOT USE intubation or mechanical ventilation. May consider use of less invasive airway support such as BiPAP or CPAP. Also provide comfort measures. Transfer to the hospital if indicated. Avoid intensive care.   Antibiotics: Antibiotics if indicated  IV Fluids: IV fluids if indicated  Feeding Tube: No feeding tube    SUMMARY OF RECOMMENDATIONS    DNR/DNI. Treat the treatable at this time.  Continue current outpatient palliative.  Prognosis:   Poor overall      Primary Diagnoses: Present on Admission: . Abdominal pain   I have reviewed the medical record, interviewed the patient and family, and examined the patient. The following aspects are pertinent.  Past Medical History:  Diagnosis Date  . BPH (benign prostatic hyperplasia)   . CHF (congestive heart failure) ([place])   . Chronic pain   . COPD (chronic obstructive pulmonary disease) ([place])   . Hypertension   . Morbid obesity ([place])   . Pressure ulcer   . Rheumatoid arthritis ([place])   . Schizophrenia ([place])   . Thyroid disease    Social History   Socioeconomic History  . Marital status: Single    Spouse name: Not on file  . Number of children: Not on file  . Years of education: Not on file  . Highest  education level: Not on file  Occupational History  . Not on file  Tobacco Use  . Smoking status: Never Smoker  . Smokeless tobacco: Never Used  Vaping Use  . Vaping Use: Never used  Substance and Sexual Activity  . Alcohol use: No  . Drug use: No  . Sexual activity: Not Currently    Birth control/protection: None  Other Topics Concern  . Not on file  Social History Narrative   Resident at [facility].   Social Determinants of Health   Financial Resource Strain: Unknown  . Difficulty of Paying Living Expenses: Patient refused  Food Insecurity: Unknown  . Worried About Charity fundraiser in the Last Year: Patient refused  . Ran Out of Food in the Last Year: Patient refused  Transportation Needs: No Transportation Needs  . Lack of Transportation (Medical): No  . Lack of Transportation (Non-Medical): No  Physical Activity: Unknown  . Days of Exercise per Week: Patient refused  . Minutes of Exercise per Session: Patient refused  Stress: No Stress Concern Present  . Feeling of Stress : Only a little  Social Connections: Unknown  . Frequency of Communication with Friends and Family: Patient refused  . Frequency of Social Gatherings with Friends and Family: Patient refused  . Attends Religious Services: Patient refused  . Active Member of Clubs or Organizations: Patient refused  . Attends Archivist Meetings: Patient refused  . Marital Status: Patient refused   Family History  Family history unknown: Yes   Scheduled Meds: . benztropine  0.5 mg Oral BID  . Chlorhexidine Gluconate Cloth  6 each Topical Daily  . docusate sodium  100 mg Oral BID  . enoxaparin (LOVENOX) injection  40 mg Subcutaneous Q24H  . feeding supplement (PRO-STAT SUGAR FREE 64)  30 mL Oral TID WC  . ferrous sulfate  325 mg Oral BID WC  . fluPHENAZine  15 mg Oral BID  . furosemide  20 mg Oral BID  . hydrALAZINE  50 mg Oral QID  . hydrocortisone  10 mg Oral BID  . hydroxychloroquine  200  mg Oral BID  . levothyroxine  200 mcg Oral QAC breakfast  . methocarbamol  750 mg Oral QHS  . metoprolol tartrate  75 mg Oral BID  . multivitamin with minerals  1 tablet Oral Daily  . pantoprazole  40 mg Oral BH-[id number]  . polyethylene glycol  17 g Oral Daily  . potassium chloride  10 mEq Oral Daily  . tamsulosin  0.4 mg Oral QPC breakfast   Continuous Infusions: . 0.9 % NaCl with KCl 20 mEq / L 100 mL/hr at  06/16/20 0508  . meropenem (MERREM) IV 1 g (06/16/20 1037)   PRN Meds:.acetaminophen **OR** acetaminophen, albuterol, LORazepam, magnesium hydroxide, morphine injection, ondansetron **OR** ondansetron (ZOFRAN) IV, promethazine, traMADol, traZODone Medications Prior to Admission:  Prior to Admission medications   Medication Sig Start Date End Date Taking? Authorizing Provider  acetaminophen (TYLENOL) 325 MG tablet Take 650 mg by mouth every 4 (four) hours as needed for mild pain or moderate pain.   Yes [provider]  albuterol (PROVENTIL HFA;VENTOLIN HFA) 108 (90 Base) MCG/ACT inhaler Inhale 1 puff into the lungs every 3 (three) hours as needed for wheezing or shortness of breath.    Yes [provider]  ammonium lactate (LAC-HYDRIN) 12 % lotion Apply 1 application topically 2 (two) times daily. Apply topically to bilateral feet   Yes [provider]  ATIVAN 2 MG tablet Take 2 mg by mouth every 6 (six) hours as needed. 04/30/20  Yes [provider]  benztropine (COGENTIN) 0.5 MG tablet Take 0.5 mg by mouth 2 (two) times daily.   Yes [provider]  docusate sodium (COLACE) 100 MG capsule Take 100 mg by mouth 2 (two) times daily.   Yes [provider]  ferrous sulfate 325 (65 FE) MG tablet Take 1 tablet (325 mg total) by mouth 2 (two) times daily with a meal. 11/08/19  Yes Memon, Jolaine Artist, MD  fluPHENAZine (PROLIXIN) 5 MG tablet Take 3 tablets (15 mg total) by mouth 2 (two) times daily. 12/11/19  Yes Sakai, Isami, DO  furosemide (LASIX) 20  MG tablet Take 1 tablet (20 mg total) by mouth 2 (two) times daily. 12/11/19 12/10/20 Yes Sakai, Isami, DO  hydrALAZINE (APRESOLINE) 50 MG tablet Take 50 mg by mouth 4 (four) times daily. 04/30/20  Yes [provider]  hydrocortisone (CORTEF) 10 MG tablet Take 1 tablet (10 mg total) by mouth 2 (two) times daily. 12/30/17  Yes Fritzi Mandes, MD  hydroxychloroquine (PLAQUENIL) 200 MG tablet Take 200 mg by mouth 2 (two) times daily.   Yes [provider]  levothyroxine (SYNTHROID) 200 MCG tablet Take 200 mcg by mouth daily before breakfast.    Yes [provider]  lidocaine-prilocaine (EMLA) cream Apply 1 application topically as needed. To port a cath site - 1 hour prior to infusion 12/10/19  Yes Brahmanday, Elisha Headland, MD  methocarbamol (ROBAXIN) 750 MG tablet Take 750 mg by mouth at bedtime.   Yes [provider]  metoprolol tartrate (LOPRESSOR) 50 MG tablet Take 1 tablet (50 mg total) by mouth 2 (two) times daily. Patient taking differently: Take 75 mg by mouth 2 (two) times daily.  07/28/19  Yes Bettey Costa, MD  Multiple Vitamins-Minerals (MULTIVITAMIN WITH MINERALS) tablet Take 1 tablet by mouth daily.   Yes [provider]  pantoprazole (PROTONIX) 40 MG tablet Take 1 tablet (40 mg total) by mouth daily. Patient taking differently: Take 40 mg by mouth every morning.  11/09/19  Yes Kathie Dike, MD  Polyethylene Glycol 3350 (MIRALAX PO) Take 17 g by mouth daily.   Yes [provider]  potassium chloride (KLOR-CON) 10 MEQ tablet Take 10 mEq by mouth daily. Open contents and sprinkle in applesauce or pudding   Yes [provider]  promethazine (PHENERGAN) 12.5 MG tablet Take 12.5 mg by mouth every 6 (six) hours as needed for nausea or vomiting.   Yes [provider]  traMADol (ULTRAM) 50 MG tablet Take 1 tablet (50 mg total) by mouth every 6 (six) hours as needed for  moderate pain or severe pain. Patient taking differently: Take 50 mg by  mouth every 4 (four) hours as needed for moderate pain or severe pain.  11/20/19  Yes Amin, Jeanella Flattery, MD  Amino Acids-Protein Hydrolys (FEEDING SUPPLEMENT, PRO-STAT SUGAR FREE 64,) LIQD Take 30 mLs by mouth 3 (three) times daily with meals. Sugar free    [provider]  guaifenesin (ROBITUSSIN) 100 MG/5ML syrup Take 100 mg by mouth every 4 (four) hours as needed for cough. Patient not taking: Reported on 06/15/2020    [provider]  tamsulosin (FLOMAX) 0.4 MG CAPS capsule Take 0.4 mg by mouth daily after breakfast.  Patient not taking: Reported on 06/15/2020    [provider]  vitamin C (ASCORBIC ACID) 250 MG tablet Take 500 mg by mouth 2 (two) times daily.  Patient not taking: Reported on 06/15/2020    [provider]   Allergies  Allergen Reactions  . Methadone   . Penicillins     Tolerates cephalosporins   . Propoxyphene   . Sulfa Antibiotics   . Valproic Acid And Related     Thrombocytopenia   Review of Systems  All other systems reviewed and are negative.   Physical Exam Pulmonary:     Effort: Pulmonary effort is normal.  Neurological:     Mental Status: He is alert.     Vital Signs: BP 101/61 (BP Location: Left Arm)   Pulse (!) 102   Temp 99.8 F (37.7 C)   Resp 16   Ht 6\' 2"  (1.88 m)   Wt 122.9 kg   SpO2 100%   BMI 34.79 kg/m  Pain Scale: PAINAD   Pain Score: 2    SpO2: SpO2: 100 % O2 Device:SpO2: 100 % O2 Flow Rate: .O2 Flow Rate (L/min): 2 L/min  IO: Intake/output summary:   Intake/Output Summary (Last 24 hours) at 06/16/2020 1252 Last data filed at 06/16/2020 9407 Gross per 24 hour  Intake 1680 ml  Output 700 ml  Net 980 ml    LBM: Last BM Date: 06/16/20 Baseline Weight: Weight: 122.9 kg Most recent weight: Weight: 122.9 kg      Time In: 12:15 Time Out: 12:55 Time Total: 40 min Greater than 50%  of this time was spent counseling and coordinating care related to the above assessment and plan.  Signed  by: Asencion Gowda, NP   Please contact Palliative Medicine Team phone at [phone] for questions and concerns.  For individual provider: See Shea Evans

## 2020-06-16 NOTE — Progress Notes (Signed)
PROGRESS NOTE    Jerry [name]  NTI:[id number] DOB: 07/23/1956 DOA: 06/15/2020 PCP: [name], MD   Brief Narrative:  Jerry [name]  is a 65 y.o. male with a known history of BPH, CHF, COPD, hypertension, colorectal carcinoma and rheumatoid arthritis, presented to the emergency room with acute onset of nausea without significant vomiting as well as diarrhea and abdominal pain.  Symptoms have been going on over the last couple of days at [facility] where he resides.  He has been having occasional cough without wheezing or dyspnea chest pain or palpitations.  No bilious vomitus or hematemesis.  No dysuria, oliguria or hematuria or flank pain.  CT abdomen with increasing mass involving the ascending colon.  Not a surgical candidate.  Palliative care was consulted.  Patient has a legal guardian.  Subjective: Patient continued to have some nausea.  Still abdominal pain and tenderness, he started screaming when I just put my hand on his belly.  Letter stating that you hurt my hip.  When I tried to see the lower extremities for any edema he said do not touch me anymore.  Assessment & Plan:   Active Problems:   Abdominal pain  Abdominal pain, likely secondary to enlarging ascending colon mass/history of colorectal cancer.  Surgery was consulted and he is not a surgical candidate.  There was some initial concern of sepsis due to hypotension, tachycardia and leukocytosis.  Markedly elevated procalcitonin at 132.  Leukocytosis improving.  Blood cultures remain negative, urine culture with more than 60,000 colonies of E. coli.  He was started on meropenem and vancomycin. -Stop vancomycin. -Continue meropenem for possible intra-abdominal infection-should be able to cover UTI.  UTI.  Urine culture with more than 60,000 colonies of E. coli.  Patient is already on meropenem.-Follow-up susceptibility results.  AKI.  Resolved.  With IV fluid. -Continue IV fluids due to poor p.o. intake.  Hypokalemia.   Resolved.  Hypertension.  Blood pressure on softer side. -Continue metoprolol and Lasix for now. -Continue to monitor.  Hypothyroidism.  TSH mildly elevated. -Check T4. -Continue Synthroid  Rheumatoid arthritis. -Continue home dose of Plaquenil.  Objective: Vitals:   06/15/20 2331 06/16/20 0425 06/16/20 0453 06/16/20 0754  BP: 107/63 (!) 88/55 (!) 92/58 101/61  Pulse: (!) 103 (!) 101 99 (!) 102  Resp: 18 18  16   Temp: 99.6 F (37.6 C) 99.5 F (37.5 C)  99.8 F (37.7 C)  TempSrc: Oral Oral    SpO2: 98% 100%  100%  Weight:      Height:        Intake/Output Summary (Last 24 hours) at 06/16/2020 1816 Last data filed at 06/16/2020 1230 Gross per 24 hour  Intake 1320 ml  Output 700 ml  Net 620 ml   Filed Weights   06/15/20 0507  Weight: 122.9 kg    Examination:  General exam: Well developed, obese gentleman, appears calm and comfortable  Respiratory system: Clear to auscultation. Respiratory effort normal. Cardiovascular system: S1 & S2 heard, RRR. No JVD, murmurs, rubs, gallops or clicks. Gastrointestinal system: Patient screamed when I just put my hand on abdomen, refused further GI exam. Central nervous system: Alert and oriented. No focal neurological deficits. Extremities: Trace edema, no cyanosis, pulses intact and symmetrical. Psychiatry: Judgement and insight appear impaired.  DVT prophylaxis: Lovenox Code Status: DNR Family Communication: Palliative care talked with legal guardian.  She wants to continue with treatable.  No aggressive measures. Disposition Plan:  Status is: Inpatient  Remains inpatient appropriate because:Inpatient level  of care appropriate due to severity of illness   Dispo: The patient is from: SNF              Anticipated d/c is to: SNF              Anticipated d/c date is: 2 days              Patient currently is not medically stable to d/c.   Consultants:   Surgery  Palliative care  Procedures:  Antimicrobials:    Meropenem  Data Reviewed: I have personally reviewed following labs and imaging studies  CBC: Recent Labs  Lab 06/15/20 0048 06/15/20 0558 06/16/20 0420  WBC 24.4* 19.4* 15.9*  NEUTROABS 19.2*  --   --   HGB 9.7* 9.1* 8.8*  HCT 30.2* 28.1* 27.9*  MCV 83.4 81.9 84.0  PLT 155 140* 161*   Basic Metabolic Panel: Recent Labs  Lab 06/15/20 0048 06/15/20 0558 06/16/20 0420  NA 140 141 139  K 3.4* 3.4* 3.3*  CL 101 102 105  CO2 30 28 26   GLUCOSE 65* 53* 90  BUN 25* 26* 23  CREATININE 1.44* 1.22 0.63  CALCIUM 8.1* 8.4* 8.1*  MG  --  1.7  --    GFR: Estimated Creatinine Clearance: 130 mL/min (by C-G formula based on SCr of 0.63 mg/dL). Liver Function Tests: Recent Labs  Lab 06/15/20 0048  AST 25  ALT 16  ALKPHOS 90  BILITOT 0.7  PROT 7.3  ALBUMIN 2.7*   Recent Labs  Lab 06/15/20 0048  LIPASE 22   No results for input(s): AMMONIA in the last 168 hours. Coagulation Profile: Recent Labs  Lab 06/15/20 0558  INR 1.4*   Cardiac Enzymes: No results for input(s): CKTOTAL, CKMB, CKMBINDEX, TROPONINI in the last 168 hours. BNP (last 3 results) No results for input(s): PROBNP in the last 8760 hours. HbA1C: No results for input(s): HGBA1C in the last 72 hours. CBG: No results for input(s): GLUCAP in the last 168 hours. Lipid Profile: No results for input(s): CHOL, HDL, LDLCALC, TRIG, CHOLHDL, LDLDIRECT in the last 72 hours. Thyroid Function Tests: Recent Labs    06/15/20 0558  TSH 4.610*   Anemia Panel: No results for input(s): VITAMINB12, FOLATE, FERRITIN, TIBC, IRON, RETICCTPCT in the last 72 hours. Sepsis Labs: Recent Labs  Lab 06/15/20 0048 06/15/20 0558  PROCALCITON  --  132.27  LATICACIDVEN 1.2 1.2    Recent Results (from the past 240 hour(s))  Culture, blood (routine x 2)     Status: None (Preliminary result)   Collection Time: 06/15/20 12:44 AM   Specimen: BLOOD LEFT WRIST  Result Value Ref Range Status   Specimen Description BLOOD LEFT  WRIST  Final   Special Requests   Final    BOTTLES DRAWN AEROBIC AND ANAEROBIC Blood Culture adequate volume   Culture   Final    NO GROWTH 1 DAY Performed at [facility], [address] [place], [place] [address]    Report Status PENDING  Incomplete  Urine culture     Status: Abnormal (Preliminary result)   Collection Time: 06/15/20 12:48 AM   Specimen: Urine, Clean Catch  Result Value Ref Range Status   Specimen Description   Final    URINE, CLEAN CATCH Performed at [facility], [address], [place], [place] [address]    Special Requests   Final    NONE Performed at [facility], [address] [place], [place] [address]  Culture 60,000 COLONIES/mL ESCHERICHIA COLI (A)  Final   Report Status PENDING  Incomplete  Culture, blood (routine x 2)     Status: None (Preliminary result)   Collection Time: 06/15/20 12:48 AM   Specimen: BLOOD RIGHT FOREARM  Result Value Ref Range Status   Specimen Description BLOOD RIGHT FOREARM  Final   Special Requests   Final    BOTTLES DRAWN AEROBIC AND ANAEROBIC Blood Culture adequate volume   Culture   Final    NO GROWTH 1 DAY Performed at [facility], [address], [place], [place] [address]    Report Status PENDING  Incomplete  SARS Coronavirus 2 by RT PCR (hospital order, performed in [place] hospital lab) Nasopharyngeal Nasopharyngeal Swab     Status: None   Collection Time: 06/15/20  2:05 AM   Specimen: Nasopharyngeal Swab  Result Value Ref Range Status   SARS Coronavirus 2 NEGATIVE NEGATIVE Final    Comment: (NOTE) SARS-CoV-2 target nucleic acids are NOT DETECTED.  The SARS-CoV-2 RNA is generally detectable in upper and lower respiratory specimens during the acute phase of infection. The lowest concentration of SARS-CoV-2 viral copies this assay can detect is 250 copies / mL. A negative result does not preclude SARS-CoV-2 infection and should not be used as the sole basis for  treatment or other patient management decisions.  A negative result may occur with improper specimen collection / handling, submission of specimen other than nasopharyngeal swab, presence of viral mutation(s) within the areas targeted by this assay, and inadequate number of viral copies (<250 copies / mL). A negative result must be combined with clinical observations, patient history, and epidemiological information.  Fact Sheet for Patients:   StrictlyIdeas.no  Fact Sheet for Healthcare Providers: BankingDealers.co.za  This test is not yet approved or  cleared by the [place] FDA and has been authorized for detection and/or diagnosis of SARS-CoV-2 by FDA under an Emergency Use Authorization (EUA).  This EUA will remain in effect (meaning this test can be used) for the duration of the COVID-19 declaration under Section 564(b)(1) of the Act, 21 U.S.C. section 360bbb-3(b)(1), unless the authorization is terminated or revoked sooner.  Performed at [facility], [address] [place],  [address]      Radiology Studies: CT Abdomen Pelvis Wo Contrast  Result Date: 06/15/2020 CLINICAL DATA:  Ileus. EXAM: CT ABDOMEN AND PELVIS WITHOUT CONTRAST TECHNIQUE: Multidetector CT imaging of the abdomen and pelvis was performed following the standard protocol without IV contrast. COMPARISON:  November 07, 2019 FINDINGS: Lower chest: There are fibrotic changes at the lung bases bilaterally.The heart size is normal. Hepatobiliary: The liver is normal. Cholelithiasis without acute inflammation.There is no biliary ductal dilation. Pancreas: Normal contours without ductal dilatation. No peripancreatic fluid collection. Spleen: Unremarkable. Adrenals/Urinary Tract: --Adrenal glands: Unremarkable. --Right kidney/ureter: No hydronephrosis or radiopaque kidney stones. --Left kidney/ureter: No hydronephrosis or radiopaque kidney stones.  --Urinary bladder: Unremarkable. Stomach/Bowel: --Stomach/Duodenum: No hiatal hernia or other gastric abnormality. Normal duodenal course and caliber. --Small bowel: There are few mildly dilated loops of small bowel in the abdomen without evidence for high-grade obstruction. --Colon: There are few air-fluid levels in the right hemicolon. There is a mass involving the ascending colon measuring approximately 5.5 cm in length (coronal series 5, image 39). There are surrounding inflammatory changes and mildly enlarged regional lymph nodes. There is a questionable pocket extraluminal gas adjacent to this mass (axial series 2, image 49). --Appendix: Not visualized. No right lower quadrant inflammation or  free fluid. Vascular/Lymphatic: Atherosclerotic calcification is present within the non-aneurysmal abdominal aorta, without hemodynamically significant stenosis. --No retroperitoneal lymphadenopathy. --No mesenteric lymphadenopathy. --No pelvic or inguinal lymphadenopathy. Reproductive: Unremarkable Other: No ascites or free air. The abdominal wall is normal. Musculoskeletal. There are end-stage degenerative changes both hips. IMPRESSION: 1. No evidence for small bowel obstruction. 2. There is a 5.5 cm mass involving the ascending colon, significantly increased in size from prior study. This mass is favored to represent the patient's reported colorectal carcinoma. There is a questionable focus of extraluminal gas adjacent to this mass which may indicate underlying micro perforation. There are mildly enlarged regional lymph nodes concerning for developing nodal metastatic disease. 3. There is cholelithiasis without secondary signs of acute cholecystitis. 4. Additional chronic findings as detailed above. Aortic Atherosclerosis (ICD10-I70.0). Electronically Signed   By: Constance Holster M.D.   On: 06/15/2020 02:00   DG Chest Port 1 View  Result Date: 06/15/2020 CLINICAL DATA:  Chronic shortness of breath EXAM: PORTABLE  CHEST 1 VIEW COMPARISON:  December 11, 2019 FINDINGS: The heart size is stable but enlarged. There is a stable right-sided Port-A-Cath. There are end-stage degenerative changes of both glenohumeral joints. Chronic interstitial lung markings are again noted bilaterally. The there is no pneumothorax. There is no definite focal infiltrate. IMPRESSION: 1. No acute cardiopulmonary process. 2. Again noted are findings of pulmonary fibrosis without evidence for a focal infiltrate. 3. Stable positioning of the right-sided Port-A-Cath. 4. Cardiomegaly. Electronically Signed   By: Constance Holster M.D.   On: 06/15/2020 01:14    Scheduled Meds: . benztropine  0.5 mg Oral BID  . Chlorhexidine Gluconate Cloth  6 each Topical Daily  . docusate sodium  100 mg Oral BID  . enoxaparin (LOVENOX) injection  40 mg Subcutaneous Q24H  . feeding supplement (PRO-STAT SUGAR FREE 64)  30 mL Oral TID WC  . ferrous sulfate  325 mg Oral BID WC  . fluPHENAZine  15 mg Oral BID  . furosemide  20 mg Oral BID  . hydrALAZINE  50 mg Oral QID  . hydrocortisone  10 mg Oral BID  . hydroxychloroquine  200 mg Oral BID  . levothyroxine  200 mcg Oral QAC breakfast  . methocarbamol  750 mg Oral QHS  . metoprolol tartrate  75 mg Oral BID  . multivitamin with minerals  1 tablet Oral Daily  . pantoprazole  40 mg Oral BH-q7a  . polyethylene glycol  17 g Oral Daily  . potassium chloride  10 mEq Oral Daily  . tamsulosin  0.4 mg Oral QPC breakfast   Continuous Infusions: . 0.9 % NaCl with KCl 20 mEq / L 100 mL/hr at 06/16/20 1712  . meropenem (MERREM) IV 1 g (06/16/20 1037)     LOS: 1 day   Time spent: 45 minutes.  I personally reviewed his chart.  More than 50% of time was spent for direct patient care and planning.  Lorella Nimrod, MD [facility]  If 7PM-7AM, please contact night-coverage [email or web address]  06/16/2020, 6:16 PM   This record has been created using Systems analyst. Errors have been sought  and corrected,but may not always be located. Such creation errors do not reflect on the standard of care.

## 2020-06-17 DIAGNOSIS — R112 Nausea with vomiting, unspecified: Secondary | ICD-10-CM | POA: Diagnosis not present

## 2020-06-17 DIAGNOSIS — R1084 Generalized abdominal pain: Secondary | ICD-10-CM | POA: Diagnosis not present

## 2020-06-17 DIAGNOSIS — C19 Malignant neoplasm of rectosigmoid junction: Secondary | ICD-10-CM

## 2020-06-17 DIAGNOSIS — D72829 Elevated white blood cell count, unspecified: Secondary | ICD-10-CM

## 2020-06-17 DIAGNOSIS — R197 Diarrhea, unspecified: Secondary | ICD-10-CM

## 2020-06-17 DIAGNOSIS — N390 Urinary tract infection, site not specified: Secondary | ICD-10-CM

## 2020-06-17 LAB — BLOOD CULTURE ID PANEL (REFLEXED)

## 2020-06-17 LAB — CBC
HCT: 28.4 % — ABNORMAL LOW (ref 39.0–52.0)
Hemoglobin: 9.2 g/dL — ABNORMAL LOW (ref 13.0–17.0)
MCH: 26.7 pg (ref 26.0–34.0)
MCHC: 32.4 g/dL (ref 30.0–36.0)
MCV: 82.3 fL (ref 80.0–100.0)
Platelets: 137 10*3/uL — ABNORMAL LOW (ref 150–400)
RBC: 3.45 MIL/uL — ABNORMAL LOW (ref 4.22–5.81)
RDW: 16.8 % — ABNORMAL HIGH (ref 11.5–15.5)
WBC: 12.5 10*3/uL — ABNORMAL HIGH (ref 4.0–10.5)
nRBC: 0 % (ref 0.0–0.2)

## 2020-06-17 LAB — BASIC METABOLIC PANEL
Anion gap: 7 (ref 5–15)
BUN: 13 mg/dL (ref 8–23)
CO2: 27 mmol/L (ref 22–32)
Calcium: 8 mg/dL — ABNORMAL LOW (ref 8.9–10.3)
Chloride: 111 mmol/L (ref 98–111)
Creatinine, Ser: 0.52 mg/dL — ABNORMAL LOW (ref 0.61–1.24)
GFR calc Af Amer: >60 mL/min (ref >60)
GFR calc non Af Amer: >60 mL/min (ref >60)
Glucose, Bld: 90 mg/dL (ref 70–99)
Potassium: 3.5 mmol/L (ref 3.5–5.1)
Sodium: 145 mmol/L (ref 135–145)

## 2020-06-17 LAB — PROCALCITONIN: Procalcitonin: 22.24 ng/mL

## 2020-06-17 NOTE — Progress Notes (Signed)
PHARMACY - PHYSICIAN COMMUNICATION CRITICAL VALUE ALERT - BLOOD CULTURE IDENTIFICATION (BCID)  Jerry [name] is an 64 y.o. male who presented to [facility] on 06/15/2020 with a chief complaint of abdominal pain.   Assessment:  Gram neg rods in 1 of 4 bottles (anaerobic).  BCID did not recognize specific organism.  (include suspected source if known)  Name of physician (or Provider) Contacted: [place]  Current antibiotics: Meropenem 1 gm IV Q8H  Changes to prescribed antibiotics recommended:  Patient is on recommended antibiotics - No changes needed  Results for orders placed or performed during the hospital encounter of 06/15/20  Blood Culture ID Panel (Reflexed) (Collected: 06/15/2020 12:44 AM)  Result Value Ref Range   Enterococcus species NOT DETECTED NOT DETECTED   Listeria monocytogenes NOT DETECTED NOT DETECTED   Staphylococcus species NOT DETECTED NOT DETECTED   Staphylococcus aureus (BCID) NOT DETECTED NOT DETECTED   Streptococcus species NOT DETECTED NOT DETECTED   Streptococcus agalactiae NOT DETECTED NOT DETECTED   Streptococcus pneumoniae NOT DETECTED NOT DETECTED   Streptococcus pyogenes NOT DETECTED NOT DETECTED   Acinetobacter baumannii NOT DETECTED NOT DETECTED   Enterobacteriaceae species NOT DETECTED NOT DETECTED   Enterobacter cloacae complex NOT DETECTED NOT DETECTED   Escherichia coli NOT DETECTED NOT DETECTED   Klebsiella oxytoca NOT DETECTED NOT DETECTED   Klebsiella pneumoniae NOT DETECTED NOT DETECTED   Proteus species NOT DETECTED NOT DETECTED   Serratia marcescens NOT DETECTED NOT DETECTED   Haemophilus influenzae NOT DETECTED NOT DETECTED   Neisseria meningitidis NOT DETECTED NOT DETECTED   Pseudomonas aeruginosa NOT DETECTED NOT DETECTED   Candida albicans NOT DETECTED NOT DETECTED   Candida glabrata NOT DETECTED NOT DETECTED   Candida krusei NOT DETECTED NOT DETECTED   Candida parapsilosis NOT DETECTED NOT DETECTED   Candida tropicalis NOT DETECTED NOT  DETECTED    [name] D 06/17/2020  9:19 PM

## 2020-06-17 NOTE — Progress Notes (Signed)
PROGRESS NOTE    Jerry [name]  HFW:[id number] DOB: 11/10/1956 DOA: 06/15/2020 PCP: [name], MD   Brief Narrative:  Jerry [name]  is a 64 y.o. male with a known history of BPH, CHF, COPD, hypertension, colorectal carcinoma and rheumatoid arthritis, presented to the emergency room with acute onset of nausea without significant vomiting as well as diarrhea and abdominal pain.  Symptoms have been going on over the last couple of days at [facility] where he resides.  He has been having occasional cough without wheezing or dyspnea chest pain or palpitations.  No bilious vomitus or hematemesis.  No dysuria, oliguria or hematuria or flank pain.  CT abdomen with increasing mass involving the ascending colon.  Not a surgical candidate.  Palliative care was consulted.  Patient has a legal guardian.  Subjective: Patient was feeling little better when seen today.  He was talking with someone who was not in the room.  He will let me press on his belly today.  Assessment & Plan:   Active Problems:   Abdominal pain  Abdominal pain, likely secondary to enlarging ascending colon mass/history of colorectal cancer.  Surgery was consulted and he is not a surgical candidate.  There was some initial concern of sepsis due to hypotension, tachycardia and leukocytosis.  Markedly elevated procalcitonin at 132.  Which started improving.  Leukocytosis improving.  Blood cultures remain negative, urine culture with more than 60,000 colonies of E. coli.  He was started on meropenem and vancomycin. - vancomycin was stopped yesterday. -Continue meropenem for possible intra-abdominal infection-should be able to cover UTI.  UTI.  Urine culture with more than 60,000 colonies of E. coli.  Patient is already on meropenem.-Follow-up susceptibility results.  AKI.  Resolved.  With IV fluid. -Continue IV fluids due to poor p.o. intake at a decreased rate of 50 mL/h.  Hypokalemia.  Resolved.  Hypertension.  Blood  pressure within goal. -Continue metoprolol and Lasix for now. -Continue to monitor.  Hypothyroidism.  TSH mildly elevated. -Check T4. -Continue Synthroid  Rheumatoid arthritis. -Continue home dose of Plaquenil.  Objective: Vitals:   06/16/20 2119 06/16/20 2314 06/17/20 0359 06/17/20 0747  BP: 131/73 (!) 141/85 134/89 (!) 158/90  Pulse: (!) 101 78 97 76  Resp: 16 18 20 18   Temp: 98.9 F (37.2 C) (!) 100.4 F (38 C) 98.6 F (37 C) 98.3 F (36.8 C)  TempSrc: Oral Oral Oral Oral  SpO2: 100% 99% 94% 100%  Weight:      Height:        Intake/Output Summary (Last 24 hours) at 06/17/2020 1637 Last data filed at 06/17/2020 0600 Gross per 24 hour  Intake 3827.44 ml  Output --  Net 3827.44 ml   Filed Weights   06/15/20 0507  Weight: 122.9 kg    Examination:  General.  Conically ill-appearing, obese gentleman, in no acute distress. Pulmonary.  Lungs clear bilaterally, normal respiratory effort. CV.  Regular rate and rhythm, no JVD, rub or murmur. Abdomen.  Soft, nontender, nondistended, BS positive. CNS.  Alert but not oriented.  No focal neurologic deficit. Extremities.  No edema, no cyanosis, pulses intact and symmetrical. Psychiatry.  Judgment and insight appears impaired.  DVT prophylaxis: Lovenox Code Status: DNR Family Communication: Palliative care talked with legal guardian.  She wants to continue with treatable.  No aggressive measures. Disposition Plan:  Status is: Inpatient  Remains inpatient appropriate because:Inpatient level of care appropriate due to severity of illness   Dispo: The patient is from: SNF  Anticipated d/c is to: SNF              Anticipated d/c date is: 2 days              Patient currently is not medically stable to d/c.   Consultants:   Surgery  Palliative care  Procedures:  Antimicrobials:  Meropenem  Data Reviewed: I have personally reviewed following labs and imaging studies  CBC: Recent Labs  Lab  06/15/20 0048 06/15/20 0558 06/16/20 0420 06/17/20 0407  WBC 24.4* 19.4* 15.9* 12.5*  NEUTROABS 19.2*  --   --   --   HGB 9.7* 9.1* 8.8* 9.2*  HCT 30.2* 28.1* 27.9* 28.4*  MCV 83.4 81.9 84.0 82.3  PLT 155 140* 138* 836*   Basic Metabolic Panel: Recent Labs  Lab 06/15/20 0048 06/15/20 0558 06/16/20 0420 06/17/20 0407  NA 140 141 139 145  K 3.4* 3.4* 3.3* 3.5  CL 101 102 105 111  CO2 30 28 26 27   GLUCOSE 65* 53* 90 90  BUN 25* 26* 23 13  CREATININE 1.44* 1.22 0.63 0.52*  CALCIUM 8.1* 8.4* 8.1* 8.0*  MG  --  1.7  --   --    GFR: Estimated Creatinine Clearance: 130 mL/min (A) (by C-G formula based on SCr of 0.52 mg/dL (L)). Liver Function Tests: Recent Labs  Lab 06/15/20 0048  AST 25  ALT 16  ALKPHOS 90  BILITOT 0.7  PROT 7.3  ALBUMIN 2.7*   Recent Labs  Lab 06/15/20 0048  LIPASE 22   No results for input(s): AMMONIA in the last 168 hours. Coagulation Profile: Recent Labs  Lab 06/15/20 0558  INR 1.4*   Cardiac Enzymes: No results for input(s): CKTOTAL, CKMB, CKMBINDEX, TROPONINI in the last 168 hours. BNP (last 3 results) No results for input(s): PROBNP in the last 8760 hours. HbA1C: No results for input(s): HGBA1C in the last 72 hours. CBG: No results for input(s): GLUCAP in the last 168 hours. Lipid Profile: No results for input(s): CHOL, HDL, LDLCALC, TRIG, CHOLHDL, LDLDIRECT in the last 72 hours. Thyroid Function Tests: Recent Labs    06/15/20 0558 06/16/20 1837  TSH 4.610*  --   FREET4  --  0.90   Anemia Panel: No results for input(s): VITAMINB12, FOLATE, FERRITIN, TIBC, IRON, RETICCTPCT in the last 72 hours. Sepsis Labs: Recent Labs  Lab 06/15/20 0048 06/15/20 0558 06/16/20 1837 06/17/20 0407  PROCALCITON  --  132.27 28.46 22.24  LATICACIDVEN 1.2 1.2  --   --     Recent Results (from the past 240 hour(s))  Culture, blood (routine x 2)     Status: None (Preliminary result)   Collection Time: 06/15/20 12:44 AM   Specimen: BLOOD  LEFT WRIST  Result Value Ref Range Status   Specimen Description BLOOD LEFT WRIST  Final   Special Requests   Final    BOTTLES DRAWN AEROBIC AND ANAEROBIC Blood Culture adequate volume   Culture   Final    NO GROWTH 2 DAYS Performed at [facility], [address] [place], [place] [address]    Report Status PENDING  Incomplete  Urine culture     Status: Abnormal (Preliminary result)   Collection Time: 06/15/20 12:48 AM   Specimen: Urine, Clean Catch  Result Value Ref Range Status   Specimen Description   Final    URINE, CLEAN CATCH Performed at [facility], [address] [place], [place] [address]    Special Requests   Final  NONE Performed at [facility], [address] [place], [place] [address]    Culture (A)  Final    60,000 COLONIES/mL ESCHERICHIA COLI SUSCEPTIBILITIES TO FOLLOW Performed at [facility], [place] [address], [place], [place] [address]    Report Status PENDING  Incomplete  Culture, blood (routine x 2)     Status: None (Preliminary result)   Collection Time: 06/15/20 12:48 AM   Specimen: BLOOD RIGHT FOREARM  Result Value Ref Range Status   Specimen Description BLOOD RIGHT FOREARM  Final   Special Requests   Final    BOTTLES DRAWN AEROBIC AND ANAEROBIC Blood Culture adequate volume   Culture   Final    NO GROWTH 2 DAYS Performed at [facility], [address] [place], [place] [address]    Report Status PENDING  Incomplete  SARS Coronavirus 2 by RT PCR (hospital order, performed in [place] hospital lab) Nasopharyngeal Nasopharyngeal Swab     Status: None   Collection Time: 06/15/20  2:05 AM   Specimen: Nasopharyngeal Swab  Result Value Ref Range Status   SARS Coronavirus 2 NEGATIVE NEGATIVE Final    Comment: (NOTE) SARS-CoV-2 target nucleic acids are NOT DETECTED.  The SARS-CoV-2 RNA is generally detectable in upper and lower respiratory specimens during the acute phase of infection. The  lowest concentration of SARS-CoV-2 viral copies this assay can detect is 250 copies / mL. A negative result does not preclude SARS-CoV-2 infection and should not be used as the sole basis for treatment or other patient management decisions.  A negative result may occur with improper specimen collection / handling, submission of specimen other than nasopharyngeal swab, presence of viral mutation(s) within the areas targeted by this assay, and inadequate number of viral copies (<250 copies / mL). A negative result must be combined with clinical observations, patient history, and epidemiological information.  Fact Sheet for Patients:   StrictlyIdeas.no  Fact Sheet for Healthcare Providers: BankingDealers.co.za  This test is not yet approved or  cleared by the [place] FDA and has been authorized for detection and/or diagnosis of SARS-CoV-2 by FDA under an Emergency Use Authorization (EUA).  This EUA will remain in effect (meaning this test can be used) for the duration of the COVID-19 declaration under Section 564(b)(1) of the Act, 21 U.S.C. section 360bbb-3(b)(1), unless the authorization is terminated or revoked sooner.  Performed at [facility], [address], [place], [place] [address]      Radiology Studies: No results found.  Scheduled Meds: . benztropine  0.5 mg Oral BID  . Chlorhexidine Gluconate Cloth  6 each Topical Daily  . docusate sodium  100 mg Oral BID  . enoxaparin (LOVENOX) injection  40 mg Subcutaneous Q24H  . feeding supplement (PRO-STAT SUGAR FREE 64)  30 mL Oral TID WC  . ferrous sulfate  325 mg Oral BID WC  . fluPHENAZine  15 mg Oral BID  . furosemide  20 mg Oral BID  . hydrALAZINE  50 mg Oral QID  . hydrocortisone  10 mg Oral BID  . hydroxychloroquine  200 mg Oral BID  . levothyroxine  200 mcg Oral QAC breakfast  . methocarbamol  750 mg Oral QHS  . metoprolol tartrate  75 mg Oral BID  .  multivitamin with minerals  1 tablet Oral Daily  . pantoprazole  40 mg Oral BH-q7a  . polyethylene glycol  17 g Oral Daily  . potassium chloride  10 mEq Oral Daily  . tamsulosin  0.4 mg Oral QPC breakfast   Continuous Infusions: . 0.9 % NaCl with KCl 20 mEq / L 100 mL/hr at 06/17/20 0600  . meropenem (MERREM) IV 1 g (06/17/20 1250)     LOS: 2 days   Time spent: 35 minutes.  I personally reviewed his chart.  More than 50% of time was spent for direct patient care and planning.  Lorella Nimrod, MD [facility]  If 7PM-7AM, please contact night-coverage [email or web address]  06/17/2020, 4:37 PM   This record has been created using Systems analyst. Errors have been sought and corrected,but may not always be located. Such creation errors do not reflect on the standard of care.

## 2020-06-18 DIAGNOSIS — R197 Diarrhea, unspecified: Secondary | ICD-10-CM | POA: Diagnosis not present

## 2020-06-18 DIAGNOSIS — C19 Malignant neoplasm of rectosigmoid junction: Secondary | ICD-10-CM | POA: Diagnosis not present

## 2020-06-18 DIAGNOSIS — Z515 Encounter for palliative care: Secondary | ICD-10-CM | POA: Diagnosis not present

## 2020-06-18 DIAGNOSIS — R112 Nausea with vomiting, unspecified: Secondary | ICD-10-CM | POA: Diagnosis not present

## 2020-06-18 LAB — BASIC METABOLIC PANEL
Anion gap: 12 (ref 5–15)
BUN: 10 mg/dL (ref 8–23)
CO2: 26 mmol/L (ref 22–32)
Calcium: 8.3 mg/dL — ABNORMAL LOW (ref 8.9–10.3)
Chloride: 108 mmol/L (ref 98–111)
Creatinine, Ser: 0.45 mg/dL — ABNORMAL LOW (ref 0.61–1.24)
GFR calc Af Amer: >60 mL/min (ref >60)
GFR calc non Af Amer: >60 mL/min (ref >60)
Glucose, Bld: 66 mg/dL — ABNORMAL LOW (ref 70–99)
Potassium: 3.5 mmol/L (ref 3.5–5.1)
Sodium: 146 mmol/L — ABNORMAL HIGH (ref 135–145)

## 2020-06-18 LAB — URINE CULTURE: Culture: 60000 — AB

## 2020-06-18 LAB — GLUCOSE, CAPILLARY
Glucose-Capillary: 51 mg/dL — ABNORMAL LOW (ref 70–99)
Glucose-Capillary: 54 mg/dL — ABNORMAL LOW (ref 70–99)
Glucose-Capillary: 57 mg/dL — ABNORMAL LOW (ref 70–99)
Glucose-Capillary: 68 mg/dL — ABNORMAL LOW (ref 70–99)
Glucose-Capillary: 77 mg/dL (ref 70–99)
Glucose-Capillary: 73 mg/dL (ref 70–99)

## 2020-06-18 LAB — CBC
HCT: 28.5 % — ABNORMAL LOW (ref 39.0–52.0)
Hemoglobin: 9.2 g/dL — ABNORMAL LOW (ref 13.0–17.0)
MCH: 26.6 pg (ref 26.0–34.0)
MCHC: 32.3 g/dL (ref 30.0–36.0)
MCV: 82.4 fL (ref 80.0–100.0)
Platelets: 141 10*3/uL — ABNORMAL LOW (ref 150–400)
RBC: 3.46 MIL/uL — ABNORMAL LOW (ref 4.22–5.81)
RDW: 16.7 % — ABNORMAL HIGH (ref 11.5–15.5)
WBC: 10.6 10*3/uL — ABNORMAL HIGH (ref 4.0–10.5)
nRBC: 0 % (ref 0.0–0.2)

## 2020-06-18 LAB — PROCALCITONIN: Procalcitonin: 7.57 ng/mL

## 2020-06-18 LAB — T4: T4, Total: 6.3 ug/dL (ref 4.5–12.0)

## 2020-06-18 MED ORDER — MORPHINE SULFATE (PF) 2 MG/ML IV SOLN
2.0000 mg | INTRAVENOUS | Status: DC | PRN
  Administered 2020-06-22 – 2020-06-24 (×3): 2 mg via INTRAVENOUS
  Filled 2020-06-18 (×3): qty 1

## 2020-06-18 MED ORDER — METHOCARBAMOL 1000 MG/10ML IJ SOLN
500.0000 mg | Freq: Three times a day (TID) | INTRAVENOUS | Status: DC
  Administered 2020-06-18 – 2020-06-22 (×11): 500 mg via INTRAVENOUS
  Filled 2020-06-18 (×15): qty 5

## 2020-06-18 MED ORDER — DEXTROSE 50 % IV SOLN
12.5000 g | INTRAVENOUS | Status: AC
  Administered 2020-06-18: 12.5 g via INTRAVENOUS

## 2020-06-18 MED ORDER — KCL IN DEXTROSE-NACL 40-5-0.45 MEQ/L-%-% IV SOLN
INTRAVENOUS | Status: DC
  Filled 2020-06-18 (×11): qty 1000

## 2020-06-18 NOTE — Progress Notes (Signed)
Patient with c/o nausea and abdominal discomfort.  He has had multiple episodes of small clear/light brown emesis and refused morning medications stating that he only wants cold ice water.  Multiple attempts made to give his medications with patient continuing to refuse.  MD notified.

## 2020-06-18 NOTE — Progress Notes (Signed)
PROGRESS NOTE    Jerry [name]  GYK:[id number] DOB: May 31, 1956 DOA: 06/15/2020 PCP: [name], MD   Brief Narrative:  Jerry [name]  is a 64 y.o. AA male with a known history of BPH, CHF, COPD, hypertension, colorectal carcinoma and rheumatoid arthritis, presented to the emergency room with acute onset of nausea without significant vomiting as well as diarrhea and abdominal pain.  Symptoms have been going on over the last couple of days at [facility] where he resides.  He has been having occasional cough without wheezing or dyspnea chest pain or palpitations.  No bilious vomitus or hematemesis.  No dysuria, oliguria or hematuria or flank pain.  CT abdomen with increasing mass involving the ascending colon.  Not a surgical candidate.  Palliative care was consulted.  Patient has a legal guardian.  Subjective: Nursing reported pt has been having N/V and didn't want to eat/drink or take medications.  Pt kept asking about getting transferred to "employee" hospital in [place].  Didn't appear to understand his medical conditions.  Palliative care provider and I both tried to reach pt's guardian, Jerry [name], but without success.     Assessment & Plan:   Active Problems:   Lower urinary tract infectious disease   Abdominal pain   Nausea vomiting and diarrhea   Leukocytosis   Colorectal cancer (HCC)  N/V and Abdominal pain  secondary to obstruction from enlarging ascending colon mass history of colorectal cancer.   Surgery was consulted and he is not a surgical candidate.  There was some initial concern of sepsis due to hypotension, tachycardia and leukocytosis.  Markedly elevated procalcitonin at 132.  Which started improving.  Leukocytosis improving.  Blood cultures remain negative, urine culture with more than 60,000 colonies of E. coli.  He was started on meropenem and vancomycin (d/c'ed) PLAN: -Continue meropenem for possible intra-abdominal infection  UTI.   Urine culture  with more than 60,000 colonies of E. coli.   --continue meropenem pending susceptibility results.  AKI.  Resolved.   -increase MIVF to 75 ml/hr.  Hypoglycemia 2/2 poor PO intake --BG in 50's.  Not a candidate for tube feed due to distal obstruction. --switch MIVF to D5 1/2NS --BG q6h  Hypokalemia.  Resolved.  Hypertension.   Blood pressure within goal. -Continue metoprolol and Lasix for now. -Continue to monitor.  Hypothyroidism.   -T4 wnl -Continue Synthroid  Rheumatoid arthritis. -Continue home dose of Plaquenil.   Objective: Vitals:   06/18/20 0001 06/18/20 0359 06/18/20 0758 06/18/20 1153  BP: (!) 145/86 (!) 140/76 (!) 155/102 (!) 158/100  Pulse: (!) 110 104 98 97  Resp: 20 20 16 16   Temp: 98.4 F (36.9 C) 98.4 F (36.9 C) 100.3 F (37.9 C) 99.5 F (37.5 C)  TempSrc:   Oral Oral  SpO2: 99% 97% 100% 99%  Weight:      Height:        Intake/Output Summary (Last 24 hours) at 06/18/2020 1546 Last data filed at 06/18/2020 0536 Gross per 24 hour  Intake --  Output 650 ml  Net -650 ml   Filed Weights   06/15/20 0507  Weight: 122.9 kg    Examination:  Constitutional: NAD, intermittently yelling out "nurse!", intermittently falling asleep during conversation.  Confused.   HEENT: conjunctivae and lids normal, EOMI CV: No cyanosis.   RESP: normal respiratory effort  Extremities: edema in BLE SKIN: warm, dry.  Excoriations on both legs Neuro: II - XII grossly intact.     DVT prophylaxis: Lovenox Code Status:  DNR Family Communication: Palliative care provider and I both tried to reach pt's guardian, Jerry [name] today, but without success.   Disposition Plan:  Status is: Inpatient  Remains inpatient appropriate because:Inpatient level of care appropriate due to severity of illness   Dispo: The patient is from: SNF              Anticipated d/c is to: Likely hospice facility, pending discussion with guardian.              Anticipated d/c date is: > 3  days              Patient currently is not medically stable to d/c.   Consultants:   Surgery  Palliative care  Procedures:  Antimicrobials:  Meropenem  Data Reviewed: I have personally reviewed following labs and imaging studies  CBC: Recent Labs  Lab 06/15/20 0048 06/15/20 0558 06/16/20 0420 06/17/20 0407 06/18/20 0606  WBC 24.4* 19.4* 15.9* 12.5* 10.6*  NEUTROABS 19.2*  --   --   --   --   HGB 9.7* 9.1* 8.8* 9.2* 9.2*  HCT 30.2* 28.1* 27.9* 28.4* 28.5*  MCV 83.4 81.9 84.0 82.3 82.4  PLT 155 140* 138* 137* 433*   Basic Metabolic Panel: Recent Labs  Lab 06/15/20 0048 06/15/20 0558 06/16/20 0420 06/17/20 0407 06/18/20 0606  NA 140 141 139 145 146*  K 3.4* 3.4* 3.3* 3.5 3.5  CL 101 102 105 111 108  CO2 30 28 26 27 26   GLUCOSE 65* 53* 90 90 66*  BUN 25* 26* 23 13 10   CREATININE 1.44* 1.22 0.63 0.52* 0.45*  CALCIUM 8.1* 8.4* 8.1* 8.0* 8.3*  MG  --  1.7  --   --   --    GFR: Estimated Creatinine Clearance: 130 mL/min (A) (by C-G formula based on SCr of 0.45 mg/dL (L)). Liver Function Tests: Recent Labs  Lab 06/15/20 0048  AST 25  ALT 16  ALKPHOS 90  BILITOT 0.7  PROT 7.3  ALBUMIN 2.7*   Recent Labs  Lab 06/15/20 0048  LIPASE 22   No results for input(s): AMMONIA in the last 168 hours. Coagulation Profile: Recent Labs  Lab 06/15/20 0558  INR 1.4*   Cardiac Enzymes: No results for input(s): CKTOTAL, CKMB, CKMBINDEX, TROPONINI in the last 168 hours. BNP (last 3 results) No results for input(s): PROBNP in the last 8760 hours. HbA1C: No results for input(s): HGBA1C in the last 72 hours. CBG: Recent Labs  Lab 06/18/20 0800 06/18/20 0817 06/18/20 0837 06/18/20 1153  GLUCAP 51* 54* 57* 68*   Lipid Profile: No results for input(s): CHOL, HDL, LDLCALC, TRIG, CHOLHDL, LDLDIRECT in the last 72 hours. Thyroid Function Tests: Recent Labs    06/16/20 1837  T4TOTAL 6.3  FREET4 0.90   Anemia Panel: No results for input(s): VITAMINB12,  FOLATE, FERRITIN, TIBC, IRON, RETICCTPCT in the last 72 hours. Sepsis Labs: Recent Labs  Lab 06/15/20 0048 06/15/20 0558 06/16/20 1837 06/17/20 0407 06/18/20 0606  PROCALCITON  --  132.27 28.46 22.24 7.57  LATICACIDVEN 1.2 1.2  --   --   --     Recent Results (from the past 240 hour(s))  Culture, blood (routine x 2)     Status: None (Preliminary result)   Collection Time: 06/15/20 12:44 AM   Specimen: BLOOD LEFT WRIST  Result Value Ref Range Status   Specimen Description BLOOD LEFT WRIST  Final   Special Requests   Final    BOTTLES DRAWN AEROBIC AND ANAEROBIC  Blood Culture adequate volume   Culture  Setup Time   Final    Organism ID to follow GRAM NEGATIVE RODS ANAEROBIC BOTTLE ONLY CRITICAL RESULT CALLED TO, READ BACK BY AND VERIFIED WITH: JASON ROBBINS AT 2113 ON 06/17/20 RWW Performed at [facility], [address] [place], [place] [address]    Culture GRAM NEGATIVE RODS  Final   Report Status PENDING  Incomplete  Blood Culture ID Panel (Reflexed)     Status: None   Collection Time: 06/15/20 12:44 AM  Result Value Ref Range Status   Enterococcus species NOT DETECTED NOT DETECTED Final   Listeria monocytogenes NOT DETECTED NOT DETECTED Final   Staphylococcus species NOT DETECTED NOT DETECTED Final   Staphylococcus aureus (BCID) NOT DETECTED NOT DETECTED Final   Streptococcus species NOT DETECTED NOT DETECTED Final   Streptococcus agalactiae NOT DETECTED NOT DETECTED Final   Streptococcus pneumoniae NOT DETECTED NOT DETECTED Final   Streptococcus pyogenes NOT DETECTED NOT DETECTED Final   Acinetobacter baumannii NOT DETECTED NOT DETECTED Final   Enterobacteriaceae species NOT DETECTED NOT DETECTED Final   Enterobacter cloacae complex NOT DETECTED NOT DETECTED Final   Escherichia coli NOT DETECTED NOT DETECTED Final   Klebsiella oxytoca NOT DETECTED NOT DETECTED Final   Klebsiella pneumoniae NOT DETECTED NOT DETECTED Final   Proteus species NOT DETECTED NOT  DETECTED Final   Serratia marcescens NOT DETECTED NOT DETECTED Final   Haemophilus influenzae NOT DETECTED NOT DETECTED Final   Neisseria meningitidis NOT DETECTED NOT DETECTED Final   Pseudomonas aeruginosa NOT DETECTED NOT DETECTED Final   Candida albicans NOT DETECTED NOT DETECTED Final   Candida glabrata NOT DETECTED NOT DETECTED Final   Candida krusei NOT DETECTED NOT DETECTED Final   Candida parapsilosis NOT DETECTED NOT DETECTED Final   Candida tropicalis NOT DETECTED NOT DETECTED Final    Comment: Performed at [facility], [address] [place], [place] [address]  Urine culture     Status: Abnormal   Collection Time: 06/15/20 12:48 AM   Specimen: Urine, Clean Catch  Result Value Ref Range Status   Specimen Description   Final    URINE, CLEAN CATCH Performed at [facility], [address] [place], [place] [address]    Special Requests   Final    NONE Performed at [facility], [address] [place], [place] [address]    Culture (A)  Final    60,000 COLONIES/mL ESCHERICHIA COLI Confirmed Extended Spectrum Beta-Lactamase Producer (ESBL).  In bloodstream infections from ESBL organisms, carbapenems are preferred over piperacillin/tazobactam. They are shown to have a lower risk of mortality.    Report Status 06/18/2020 FINAL  Final   Organism ID, Bacteria ESCHERICHIA COLI (A)  Final      Susceptibility   Escherichia coli - MIC*    AMPICILLIN >=32 RESISTANT Resistant     CEFAZOLIN >=64 RESISTANT Resistant     CEFTRIAXONE >=64 RESISTANT Resistant     CIPROFLOXACIN >=4 RESISTANT Resistant     GENTAMICIN <=1 SENSITIVE Sensitive     IMIPENEM <=0.25 SENSITIVE Sensitive     NITROFURANTOIN <=16 SENSITIVE Sensitive     TRIMETH/SULFA <=20 SENSITIVE Sensitive     AMPICILLIN/SULBACTAM 4 SENSITIVE Sensitive     PIP/TAZO <=4 SENSITIVE Sensitive     * 60,000 COLONIES/mL ESCHERICHIA COLI  Culture, blood (routine x 2)     Status: None (Preliminary  result)   Collection Time: 06/15/20 12:48 AM   Specimen: BLOOD RIGHT FOREARM  Result Value  Ref Range Status   Specimen Description BLOOD RIGHT FOREARM  Final   Special Requests   Final    BOTTLES DRAWN AEROBIC AND ANAEROBIC Blood Culture adequate volume   Culture   Final    NO GROWTH 3 DAYS Performed at [facility], [address] [place], [place] [address]    Report Status PENDING  Incomplete  SARS Coronavirus 2 by RT PCR (hospital order, performed in [facility] hospital lab) Nasopharyngeal Nasopharyngeal Swab     Status: None   Collection Time: 06/15/20  2:05 AM   Specimen: Nasopharyngeal Swab  Result Value Ref Range Status   SARS Coronavirus 2 NEGATIVE NEGATIVE Final    Comment: (NOTE) SARS-CoV-2 target nucleic acids are NOT DETECTED.  The SARS-CoV-2 RNA is generally detectable in upper and lower respiratory specimens during the acute phase of infection. The lowest concentration of SARS-CoV-2 viral copies this assay can detect is 250 copies / mL. A negative result does not preclude SARS-CoV-2 infection and should not be used as the sole basis for treatment or other patient management decisions.  A negative result may occur with improper specimen collection / handling, submission of specimen other than nasopharyngeal swab, presence of viral mutation(s) within the areas targeted by this assay, and inadequate number of viral copies (<250 copies / mL). A negative result must be combined with clinical observations, patient history, and epidemiological information.  Fact Sheet for Patients:   StrictlyIdeas.no  Fact Sheet for Healthcare Providers: BankingDealers.co.za  This test is not yet approved or  cleared by the [place] FDA and has been authorized for detection and/or diagnosis of SARS-CoV-2 by FDA under an Emergency Use Authorization (EUA).  This EUA will remain in effect (meaning this test can be used) for  the duration of the COVID-19 declaration under Section 564(b)(1) of the Act, 21 U.S.C. section 360bbb-3(b)(1), unless the authorization is terminated or revoked sooner.  Performed at [facility], [address], [place],  [address]      Radiology Studies: No results found.  Scheduled Meds: . benztropine  0.5 mg Oral BID  . Chlorhexidine Gluconate Cloth  6 each Topical Daily  . docusate sodium  100 mg Oral BID  . enoxaparin (LOVENOX) injection  40 mg Subcutaneous Q24H  . feeding supplement (PRO-STAT SUGAR FREE 64)  30 mL Oral TID WC  . ferrous sulfate  325 mg Oral BID WC  . fluPHENAZine  15 mg Oral BID  . furosemide  20 mg Oral BID  . hydrALAZINE  50 mg Oral QID  . hydrocortisone  10 mg Oral BID  . hydroxychloroquine  200 mg Oral BID  . levothyroxine  200 mcg Oral QAC breakfast  . metoprolol tartrate  75 mg Oral BID  . multivitamin with minerals  1 tablet Oral Daily  . pantoprazole  40 mg Oral BH-q7a  . polyethylene glycol  17 g Oral Daily  . potassium chloride  10 mEq Oral Daily  . tamsulosin  0.4 mg Oral QPC breakfast   Continuous Infusions: . dextrose 5 % and 0.45 % NaCl with KCl 40 mEq/L 50 mL/hr at 06/18/20 0850  . meropenem (MERREM) IV 1 g (06/18/20 1307)  . methocarbamol (ROBAXIN) IV       LOS: 3 days    Enzo Bi, MD [facility]  If 7PM-7AM, please contact night-coverage [email or web address]  06/18/2020, 3:46 PM

## 2020-06-18 NOTE — Progress Notes (Addendum)
Daily Progress Note   Patient Name: Jerry [name]       Date: 06/18/2020 DOB: 10-12-1956  Age: 64 y.o. MRN#: [id number] Attending Physician: Enzo Bi, MD Primary Care Physician: [name], MD Admit Date: 06/15/2020  Reason for Consultation/Follow-up: Establishing goals of care  Subjective: Patient is resting in bed. When asked how he is doing he states he has had nausea x2 today and x1 yesterday. He states he has not been hungry.  He also states "I had dinner with the second savior. Jesus Christ was the first savior." He complains of bilateral leg pain and requests assistance with repositioning. He yells upon touching his legs when attempting to assist him with repositioning.    Per notes and conversation with provider, he has required IV dextrose due to hypoglycemia and poor appetite. Attempted to contact his legal guardian Kimball Appleby five times today without success. Unable to leave a VM as an automated message plays each time stating the call cannot be completed at this time, and to please hang up and try the call again later.    Given patient's status and poor PO intake, the patient is a candidate for hospice facility placement if guardian chooses a comfort focused care.    Length of Stay: 3  Current Medications: Scheduled Meds:  . benztropine  0.5 mg Oral BID  . Chlorhexidine Gluconate Cloth  6 each Topical Daily  . dextrose  12.5 g Intravenous STAT  . docusate sodium  100 mg Oral BID  . enoxaparin (LOVENOX) injection  40 mg Subcutaneous Q24H  . feeding supplement (PRO-STAT SUGAR FREE 64)  30 mL Oral TID WC  . ferrous sulfate  325 mg Oral BID WC  . fluPHENAZine  15 mg Oral BID  . furosemide  20 mg Oral BID  . hydrALAZINE  50 mg Oral QID  . hydrocortisone  10 mg Oral BID  .  hydroxychloroquine  200 mg Oral BID  . levothyroxine  200 mcg Oral QAC breakfast  . methocarbamol  750 mg Oral QHS  . metoprolol tartrate  75 mg Oral BID  . multivitamin with minerals  1 tablet Oral Daily  . pantoprazole  40 mg Oral BH-q7a  . polyethylene glycol  17 g Oral Daily  . potassium chloride  10 mEq Oral Daily  . tamsulosin  0.4  mg Oral QPC breakfast    Continuous Infusions: . dextrose 5 % and 0.45 % NaCl with KCl 40 mEq/L 50 mL/hr at 06/18/20 0850  . meropenem (MERREM) IV 1 g (06/18/20 1307)    PRN Meds: acetaminophen **OR** acetaminophen, albuterol, LORazepam, magnesium hydroxide, morphine injection, ondansetron **OR** ondansetron (ZOFRAN) IV, promethazine, traMADol, traZODone  Physical Exam Pulmonary:     Effort: Pulmonary effort is normal.  Neurological:     Mental Status: He is alert.             Vital Signs: BP (!) 158/100 (BP Location: Left Arm)   Pulse 97   Temp 99.5 F (37.5 C) (Oral)   Resp 16   Ht 6\' 2"  (1.88 m)   Wt 122.9 kg   SpO2 99%   BMI 34.79 kg/m  SpO2: SpO2: 99 % O2 Device: O2 Device: Nasal Cannula O2 Flow Rate: O2 Flow Rate (L/min): 3 L/min  Intake/output summary:   Intake/Output Summary (Last 24 hours) at 06/18/2020 1328 Last data filed at 06/18/2020 0536 Gross per 24 hour  Intake --  Output 650 ml  Net -650 ml   LBM: Last BM Date: 06/16/20 Baseline Weight: Weight: 122.9 kg Most recent weight: Weight: 122.9 kg       Palliative Assessment/Data: 20%      Patient Active Problem List   Diagnosis Date Noted  . Nausea vomiting and diarrhea   . Leukocytosis   . Colorectal cancer ([place])   . Abdominal pain 06/15/2020  . Iron deficiency anemia due to chronic blood loss 12/10/2019  . 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 DNR (do not resuscitate) 11/08/2019  . Chronic diastolic CHF (congestive heart failure) ([place]) 11/08/2019  . Rheumatoid arthritis ([place]) 11/08/2019  . Chronic respiratory failure with hypoxia ([place]) 11/08/2019   . Palliative care encounter   . Mass of colon 11/04/2019  . Schizo affective schizophrenia ([place])   . 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 Primary adrenocortical insufficiency ([place]) 10/30/2019  . Anemia 10/30/2019  . Gastrointestinal hemorrhage   . Chronic obstructive pulmonary disease ([place])   . COVID-19 virus infection 09/11/2019  . Normocytic anemia 08/25/2019  . Community acquired pneumonia   . DNR (do not resuscitate) discussion   . SOB (shortness of breath) 07/16/2019  . Obesity, Class III, BMI 40-49.9 (morbid obesity) ([place])   . Schizophrenia ([place])   . Palliative care by specialist   . Advance care planning   . Goals of care, counseling/discussion   . CHF (congestive heart failure) ([place])   . Altered mental status   . Septic shock ([place]) 08/02/2017  . Lower urinary tract infectious disease   . Pneumonia 06/21/2017  . Thrombocytopenia ([place]) 06/21/2017  . Leukopenia 06/21/2017  . Pressure injury of skin 06/21/2017    Palliative Care Assessment & Plan    Recommendations/Plan:  Unable to reach guardian after multiple attempts.   Patient is hospice facility appropriate if guardian chooses comfort focus.   Recommend palliative to follow at D/C if comfort path is not chosen.     Code Status:    Code Status Orders  (From admission, onward)         Start     Ordered   06/16/20 1250  Do not attempt resuscitation (DNR)  Continuous       Question Answer Comment  In the event of cardiac or respiratory ARREST Do not call a "code blue"   In the event of cardiac or respiratory ARREST Do not perform Intubation,  CPR, defibrillation or ACLS   In the event of cardiac or respiratory ARREST Use medication by any route, position, wound care, and other measures to relive pain and suffering. May use oxygen, suction and manual treatment of airway obstruction as needed for comfort.   Comments d/w niece [name] and MOST form in [place]      06/16/20 1250         Code Status History    Date Active Date Inactive Code Status Order ID Comments User Context   06/15/2020 1217 06/16/2020 1250 Partial Code [id number]  Christel Mormon, MD Inpatient   06/15/2020 0452 06/15/2020 1217 DNR [id number]  Christel Mormon, MD ED   11/10/2019 2015 11/20/2019 1823 DNR [id number]  Kathie Dike, MD Inpatient   10/30/2019 1525 11/07/2019 1004 DNR [id number]  Fritzi Mandes, MD ED   09/11/2019 1024 09/16/2019 2020 DNR [id number]  Thurnell Lose, MD Inpatient   07/25/2019 1636 07/28/2019 2043 DNR [id number]  Hillary Bow, MD Inpatient   07/16/2019 1733 07/25/2019 1636 Full Code [id number]  Dustin Flock, MD ED   12/25/2017 2105 12/30/2017 1646 Full Code [id number]  Vaughan Basta, MD Inpatient   08/02/2017 1447 08/13/2017 1844 Full Code [id number]  Bettey Costa, MD Inpatient   06/21/2017 1527 07/02/2017 1628 Full Code [id number]  Theodoro Grist, MD Inpatient   Advance Care Planning Activity       Prognosis:   < 2 weeks Very poor PO intake. Coloretal CA and patient is not a candidate for surgery.    Care plan was discussed with MD  Thank you for allowing the Palliative Medicine Team to assist in the care of this patient.   Total Time 15 min Prolonged Time Billed no       Greater than 50%  of this time was spent counseling and coordinating care related to the above assessment and plan.  Asencion Gowda, NP  Please contact Palliative Medicine Team phone at [phone] for questions and concerns.

## 2020-06-19 DIAGNOSIS — R112 Nausea with vomiting, unspecified: Secondary | ICD-10-CM | POA: Diagnosis not present

## 2020-06-19 DIAGNOSIS — R197 Diarrhea, unspecified: Secondary | ICD-10-CM | POA: Diagnosis not present

## 2020-06-19 DIAGNOSIS — N390 Urinary tract infection, site not specified: Secondary | ICD-10-CM | POA: Diagnosis not present

## 2020-06-19 LAB — GLUCOSE, CAPILLARY
Glucose-Capillary: 100 mg/dL — ABNORMAL HIGH (ref 70–99)
Glucose-Capillary: 91 mg/dL (ref 70–99)
Glucose-Capillary: 94 mg/dL (ref 70–99)
Glucose-Capillary: 74 mg/dL (ref 70–99)
Glucose-Capillary: 90 mg/dL (ref 70–99)

## 2020-06-19 LAB — BASIC METABOLIC PANEL
Anion gap: 4 — ABNORMAL LOW (ref 5–15)
BUN: 8 mg/dL (ref 8–23)
CO2: 30 mmol/L (ref 22–32)
Calcium: 8.2 mg/dL — ABNORMAL LOW (ref 8.9–10.3)
Chloride: 107 mmol/L (ref 98–111)
Creatinine, Ser: 0.38 mg/dL — ABNORMAL LOW (ref 0.61–1.24)
GFR calc Af Amer: >60 mL/min (ref >60)
GFR calc non Af Amer: >60 mL/min (ref >60)
Glucose, Bld: 100 mg/dL — ABNORMAL HIGH (ref 70–99)
Potassium: 3.4 mmol/L — ABNORMAL LOW (ref 3.5–5.1)
Sodium: 141 mmol/L (ref 135–145)

## 2020-06-19 LAB — CBC
HCT: 30.9 % — ABNORMAL LOW (ref 39.0–52.0)
Hemoglobin: 9.7 g/dL — ABNORMAL LOW (ref 13.0–17.0)
MCH: 25.9 pg — ABNORMAL LOW (ref 26.0–34.0)
MCHC: 31.4 g/dL (ref 30.0–36.0)
MCV: 82.4 fL (ref 80.0–100.0)
Platelets: 166 10*3/uL (ref 150–400)
RBC: 3.75 MIL/uL — ABNORMAL LOW (ref 4.22–5.81)
RDW: 16.6 % — ABNORMAL HIGH (ref 11.5–15.5)
WBC: 8.1 10*3/uL (ref 4.0–10.5)
nRBC: 0 % (ref 0.0–0.2)

## 2020-06-19 LAB — MAGNESIUM: Magnesium: 1.7 mg/dL (ref 1.7–2.4)

## 2020-06-19 NOTE — Progress Notes (Signed)
PROGRESS NOTE    Jerry [name]  UJW:[id number] DOB: 12-Sep-1956 DOA: 06/15/2020 PCP: [name], MD   Brief Narrative:  Jerry [name]  is a 64 y.o. AA male with a known history of BPH, CHF, COPD, hypertension, colorectal carcinoma and rheumatoid arthritis, presented to the emergency room with acute onset of nausea without significant vomiting as well as diarrhea and abdominal pain.  Symptoms have been going on over the last couple of days at [facility] where he resides.  He has been having occasional cough without wheezing or dyspnea chest pain or palpitations.  No bilious vomitus or hematemesis.  No dysuria, oliguria or hematuria or flank pain.  CT abdomen with increasing mass involving the ascending colon.  Not a surgical candidate.  Palliative care was consulted.  Patient has a legal guardian.  Subjective: Pt drank some fluids today.  No complaints of nausea today.  Talked about something else incoherent.   Assessment & Plan:   Active Problems:   Lower urinary tract infectious disease   Abdominal pain   Nausea vomiting and diarrhea   Leukocytosis   Colorectal cancer (HCC)  N/V and Abdominal pain  secondary to obstruction from enlarging ascending colon mass history of colorectal cancer.   Surgery was consulted and he is not a surgical candidate.  There was some initial concern of sepsis due to hypotension, tachycardia and leukocytosis.  Markedly elevated procalcitonin at 132.  Which started improving.  Leukocytosis improving.  Blood cultures remain negative, urine culture with more than 60,000 colonies of E. coli.  He was started on meropenem and vancomycin (d/c'ed) PLAN: -continue meropenem, day 6 for possible intra-abdominal infection  UTI.   Urine culture with more than 60,000 colonies of E. coli.   --continue meropenem which is also covering intra-abdominal infection  AKI.  Resolved.   -continue MIVF at 75 ml/hr.  Hypoglycemia 2/2 poor PO intake --BG in 50's.  Not a  candidate for tube feed due to distal obstruction. --continue MIVF with D5 1/2NS --BG q6h and hypoglycemic protocol  Hypokalemia.  Resolved.  Hypertension.   Blood pressure within goal. -Continue metoprolol and Lasix for now. -Continue to monitor.  Hypothyroidism.   -T4 wnl -Continue Synthroid  Rheumatoid arthritis. -Continue home dose of Plaquenil.   Objective: Vitals:   06/18/20 2238 06/19/20 0613 06/19/20 0741 06/19/20 1116  BP: (!) 145/102 (!) 140/97 (!) 137/78 117/76  Pulse: 102 (!) 107 103 101  Resp: 18 16 20 18   Temp: 99.2 F (37.3 C) 97.8 F (36.6 C) 99 F (37.2 C) 99.1 F (37.3 C)  TempSrc: Oral Oral Oral Oral  SpO2: 96% 98% 99% 98%  Weight:      Height:        Intake/Output Summary (Last 24 hours) at 06/19/2020 1525 Last data filed at 06/19/2020 0920 Gross per 24 hour  Intake 2003.53 ml  Output --  Net 2003.53 ml   Filed Weights   06/15/20 0507  Weight: 122.9 kg    Examination:  Constitutional: NAD, awake, confused, incoherent thoughts HEENT: conjunctivae and lids normal, EOMI CV: RRR no M,R,G. Distal pulses +2.  No cyanosis.   RESP: normal respiratory effort, on 3L GI: +BS, NTND Extremities: contracture in left hand.  Generalized swelling in all extremities. SKIN: warm, dry  Neuro: II - XII grossly intact.     DVT prophylaxis: Lovenox Code Status: DNR Family Communication:  Disposition Plan:  Status is: Inpatient  Remains inpatient appropriate because:Inpatient level of care appropriate due to severity of illness  Dispo: The patient is from: SNF              Anticipated d/c is to: Likely hospice facility, pending discussion with guardian.              Anticipated d/c date is: > 3 days              Patient currently is not medically stable to d/c.  On IV abx for sepsis, and currently can not take in enough oral intake to sustain life.   Consultants:   Surgery  Palliative care  Procedures:  Antimicrobials:  Meropenem  Data  Reviewed: I have personally reviewed following labs and imaging studies  CBC: Recent Labs  Lab 06/15/20 0048 06/15/20 0048 06/15/20 0558 06/16/20 0420 06/17/20 0407 06/18/20 0606 06/19/20 0347  WBC 24.4*   < > 19.4* 15.9* 12.5* 10.6* 8.1  NEUTROABS 19.2*  --   --   --   --   --   --   HGB 9.7*   < > 9.1* 8.8* 9.2* 9.2* 9.7*  HCT 30.2*   < > 28.1* 27.9* 28.4* 28.5* 30.9*  MCV 83.4   < > 81.9 84.0 82.3 82.4 82.4  PLT 155   < > 140* 138* 137* 141* 166   < > = values in this interval not displayed.   Basic Metabolic Panel: Recent Labs  Lab 06/15/20 0558 06/16/20 0420 06/17/20 0407 06/18/20 0606 06/19/20 0347  NA 141 139 145 146* 141  K 3.4* 3.3* 3.5 3.5 3.4*  CL 102 105 111 108 107  CO2 28 26 27 26 30   GLUCOSE 53* 90 90 66* 100*  BUN 26* 23 13 10 8   CREATININE 1.22 0.63 0.52* 0.45* 0.38*  CALCIUM 8.4* 8.1* 8.0* 8.3* 8.2*  MG 1.7  --   --   --  1.7   GFR: Estimated Creatinine Clearance: 130 mL/min (A) (by C-G formula based on SCr of 0.38 mg/dL (L)). Liver Function Tests: Recent Labs  Lab 06/15/20 0048  AST 25  ALT 16  ALKPHOS 90  BILITOT 0.7  PROT 7.3  ALBUMIN 2.7*   Recent Labs  Lab 06/15/20 0048  LIPASE 22   No results for input(s): AMMONIA in the last 168 hours. Coagulation Profile: Recent Labs  Lab 06/15/20 0558  INR 1.4*   Cardiac Enzymes: No results for input(s): CKTOTAL, CKMB, CKMBINDEX, TROPONINI in the last 168 hours. BNP (last 3 results) No results for input(s): PROBNP in the last 8760 hours. HbA1C: No results for input(s): HGBA1C in the last 72 hours. CBG: Recent Labs  Lab 06/18/20 1731 06/18/20 2120 06/19/20 0614 06/19/20 0749 06/19/20 1219  GLUCAP 77 73 100* 91 94   Lipid Profile: No results for input(s): CHOL, HDL, LDLCALC, TRIG, CHOLHDL, LDLDIRECT in the last 72 hours. Thyroid Function Tests: Recent Labs    06/16/20 1837  T4TOTAL 6.3  FREET4 0.90   Anemia Panel: No results for input(s): VITAMINB12, FOLATE, FERRITIN,  TIBC, IRON, RETICCTPCT in the last 72 hours. Sepsis Labs: Recent Labs  Lab 06/15/20 0048 06/15/20 0558 06/16/20 1837 06/17/20 0407 06/18/20 0606  PROCALCITON  --  132.27 28.46 22.24 7.57  LATICACIDVEN 1.2 1.2  --   --   --     Recent Results (from the past 240 hour(s))  Culture, blood (routine x 2)     Status: Abnormal (Preliminary result)   Collection Time: 06/15/20 12:44 AM   Specimen: BLOOD LEFT WRIST  Result Value Ref Range Status   Specimen  Description   Final    BLOOD LEFT WRIST Performed at [facility] [address] [place], [place] [address]    Special Requests   Final    BOTTLES DRAWN AEROBIC AND ANAEROBIC Blood Culture adequate volume Performed at [facility], [address] [place], [place] [address]    Culture  Setup Time   Final    GRAM NEGATIVE RODS ANAEROBIC BOTTLE ONLY CRITICAL RESULT CALLED TO, READ BACK BY AND VERIFIED WITH: JASON ROBBINS AT 2113 ON 06/17/20 RWW    Culture (A)  Final    PROVIDENCIA STUARTII SUSCEPTIBILITIES TO FOLLOW Performed at [facility], [place] [address], [place], [place] [address]    Report Status PENDING  Incomplete  Blood Culture ID Panel (Reflexed)     Status: None   Collection Time: 06/15/20 12:44 AM  Result Value Ref Range Status   Enterococcus species NOT DETECTED NOT DETECTED Final   Listeria monocytogenes NOT DETECTED NOT DETECTED Final   Staphylococcus species NOT DETECTED NOT DETECTED Final   Staphylococcus aureus (BCID) NOT DETECTED NOT DETECTED Final   Streptococcus species NOT DETECTED NOT DETECTED Final   Streptococcus agalactiae NOT DETECTED NOT DETECTED Final   Streptococcus pneumoniae NOT DETECTED NOT DETECTED Final   Streptococcus pyogenes NOT DETECTED NOT DETECTED Final   Acinetobacter baumannii NOT DETECTED NOT DETECTED Final   Enterobacteriaceae species NOT DETECTED NOT DETECTED Final   Enterobacter cloacae complex NOT DETECTED NOT DETECTED Final   Escherichia coli NOT  DETECTED NOT DETECTED Final   Klebsiella oxytoca NOT DETECTED NOT DETECTED Final   Klebsiella pneumoniae NOT DETECTED NOT DETECTED Final   Proteus species NOT DETECTED NOT DETECTED Final   Serratia marcescens NOT DETECTED NOT DETECTED Final   Haemophilus influenzae NOT DETECTED NOT DETECTED Final   Neisseria meningitidis NOT DETECTED NOT DETECTED Final   Pseudomonas aeruginosa NOT DETECTED NOT DETECTED Final   Candida albicans NOT DETECTED NOT DETECTED Final   Candida glabrata NOT DETECTED NOT DETECTED Final   Candida krusei NOT DETECTED NOT DETECTED Final   Candida parapsilosis NOT DETECTED NOT DETECTED Final   Candida tropicalis NOT DETECTED NOT DETECTED Final    Comment: Performed at [facility], [address] [place], [place] [address]  Urine culture     Status: Abnormal   Collection Time: 06/15/20 12:48 AM   Specimen: Urine, Clean Catch  Result Value Ref Range Status   Specimen Description   Final    URINE, CLEAN CATCH Performed at [facility], [address], [place], [place] [address]    Special Requests   Final    NONE Performed at [facility], [address] [place],  [address]    Culture (A)  Final    60,000 COLONIES/mL ESCHERICHIA COLI Confirmed Extended Spectrum Beta-Lactamase Producer (ESBL).  In bloodstream infections from ESBL organisms, carbapenems are preferred over piperacillin/tazobactam. They are shown to have a lower risk of mortality.    Report Status 06/18/2020 FINAL  Final   Organism ID, Bacteria ESCHERICHIA COLI (A)  Final      Susceptibility   Escherichia coli - MIC*    AMPICILLIN >=32 RESISTANT Resistant     CEFAZOLIN >=64 RESISTANT Resistant     CEFTRIAXONE >=64 RESISTANT Resistant     CIPROFLOXACIN >=4 RESISTANT Resistant     GENTAMICIN <=1 SENSITIVE Sensitive     IMIPENEM <=0.25 SENSITIVE Sensitive     NITROFURANTOIN <=16 SENSITIVE Sensitive     TRIMETH/SULFA <=20 SENSITIVE Sensitive  AMPICILLIN/SULBACTAM 4 SENSITIVE Sensitive     PIP/TAZO <=4 SENSITIVE Sensitive     * 60,000 COLONIES/mL ESCHERICHIA COLI  Culture, blood (routine x 2)     Status: None (Preliminary result)   Collection Time: 06/15/20 12:48 AM   Specimen: BLOOD RIGHT FOREARM  Result Value Ref Range Status   Specimen Description BLOOD RIGHT FOREARM  Final   Special Requests   Final    BOTTLES DRAWN AEROBIC AND ANAEROBIC Blood Culture adequate volume   Culture   Final    NO GROWTH 4 DAYS Performed at [facility], [address] [place], [place] [address]    Report Status PENDING  Incomplete  SARS Coronavirus 2 by RT PCR (hospital order, performed in [place] hospital lab) Nasopharyngeal Nasopharyngeal Swab     Status: None   Collection Time: 06/15/20  2:05 AM   Specimen: Nasopharyngeal Swab  Result Value Ref Range Status   SARS Coronavirus 2 NEGATIVE NEGATIVE Final    Comment: (NOTE) SARS-CoV-2 target nucleic acids are NOT DETECTED.  The SARS-CoV-2 RNA is generally detectable in upper and lower respiratory specimens during the acute phase of infection. The lowest concentration of SARS-CoV-2 viral copies this assay can detect is 250 copies / mL. A negative result does not preclude SARS-CoV-2 infection and should not be used as the sole basis for treatment or other patient management decisions.  A negative result may occur with improper specimen collection / handling, submission of specimen other than nasopharyngeal swab, presence of viral mutation(s) within the areas targeted by this assay, and inadequate number of viral copies (<250 copies / mL). A negative result must be combined with clinical observations, patient history, and epidemiological information.  Fact Sheet for Patients:   StrictlyIdeas.no  Fact Sheet for Healthcare Providers: BankingDealers.co.za  This test is not yet approved or  cleared by the [place] FDA and has  been authorized for detection and/or diagnosis of SARS-CoV-2 by FDA under an Emergency Use Authorization (EUA).  This EUA will remain in effect (meaning this test can be used) for the duration of the COVID-19 declaration under Section 564(b)(1) of the Act, 21 U.S.C. section 360bbb-3(b)(1), unless the authorization is terminated or revoked sooner.  Performed at [facility], [address], [place], [place] [address]      Radiology Studies: No results found.  Scheduled Meds: . benztropine  0.5 mg Oral BID  . Chlorhexidine Gluconate Cloth  6 each Topical Daily  . docusate sodium  100 mg Oral BID  . enoxaparin (LOVENOX) injection  40 mg Subcutaneous Q24H  . feeding supplement (PRO-STAT SUGAR FREE 64)  30 mL Oral TID WC  . ferrous sulfate  325 mg Oral BID WC  . fluPHENAZine  15 mg Oral BID  . furosemide  20 mg Oral BID  . hydrALAZINE  50 mg Oral QID  . hydrocortisone  10 mg Oral BID  . hydroxychloroquine  200 mg Oral BID  . levothyroxine  200 mcg Oral QAC breakfast  . metoprolol tartrate  75 mg Oral BID  . multivitamin with minerals  1 tablet Oral Daily  . pantoprazole  40 mg Oral BH-q7a  . polyethylene glycol  17 g Oral Daily  . potassium chloride  10 mEq Oral Daily  . tamsulosin  0.4 mg Oral QPC breakfast   Continuous Infusions: . dextrose 5 % and 0.45 % NaCl with KCl 40 mEq/L 75 mL/hr at 06/19/20 1211  . meropenem (MERREM) IV 1 g (06/19/20 1433)  . methocarbamol (  ROBAXIN) IV Stopped (06/18/20 2311)     LOS: 4 days    Enzo Bi, MD [facility]  If 7PM-7AM, please contact night-coverage [email or web address]  06/19/2020, 3:25 PM

## 2020-06-20 DIAGNOSIS — C19 Malignant neoplasm of rectosigmoid junction: Secondary | ICD-10-CM | POA: Diagnosis not present

## 2020-06-20 DIAGNOSIS — N390 Urinary tract infection, site not specified: Secondary | ICD-10-CM | POA: Diagnosis not present

## 2020-06-20 LAB — CBC
HCT: 28.6 % — ABNORMAL LOW (ref 39.0–52.0)
Hemoglobin: 9.4 g/dL — ABNORMAL LOW (ref 13.0–17.0)
MCH: 26.5 pg (ref 26.0–34.0)
MCHC: 32.9 g/dL (ref 30.0–36.0)
MCV: 80.6 fL (ref 80.0–100.0)
Platelets: 178 10*3/uL (ref 150–400)
RBC: 3.55 MIL/uL — ABNORMAL LOW (ref 4.22–5.81)
RDW: 16.8 % — ABNORMAL HIGH (ref 11.5–15.5)
WBC: 6.4 10*3/uL (ref 4.0–10.5)
nRBC: 0 % (ref 0.0–0.2)

## 2020-06-20 LAB — BASIC METABOLIC PANEL
Anion gap: 6 (ref 5–15)
BUN: 6 mg/dL — ABNORMAL LOW (ref 8–23)
CO2: 26 mmol/L (ref 22–32)
Calcium: 7.9 mg/dL — ABNORMAL LOW (ref 8.9–10.3)
Chloride: 106 mmol/L (ref 98–111)
Creatinine, Ser: 0.41 mg/dL — ABNORMAL LOW (ref 0.61–1.24)
GFR calc Af Amer: >60 mL/min (ref >60)
GFR calc non Af Amer: >60 mL/min (ref >60)
Glucose, Bld: 88 mg/dL (ref 70–99)
Potassium: 4.2 mmol/L (ref 3.5–5.1)
Sodium: 138 mmol/L (ref 135–145)

## 2020-06-20 LAB — CULTURE, BLOOD (ROUTINE X 2)
Culture: NO GROWTH
Special Requests: ADEQUATE
Special Requests: ADEQUATE

## 2020-06-20 LAB — GLUCOSE, CAPILLARY
Glucose-Capillary: 74 mg/dL (ref 70–99)
Glucose-Capillary: 75 mg/dL (ref 70–99)
Glucose-Capillary: 80 mg/dL (ref 70–99)
Glucose-Capillary: 85 mg/dL (ref 70–99)
Glucose-Capillary: 86 mg/dL (ref 70–99)

## 2020-06-20 LAB — MAGNESIUM: Magnesium: 1.6 mg/dL — ABNORMAL LOW (ref 1.7–2.4)

## 2020-06-20 MED ORDER — MAGNESIUM SULFATE 2 GM/50ML IV SOLN
2.0000 g | Freq: Once | INTRAVENOUS | Status: AC
  Administered 2020-06-20: 2 g via INTRAVENOUS
  Filled 2020-06-20: qty 50

## 2020-06-20 NOTE — Progress Notes (Signed)
PROGRESS NOTE    Jerry [name]  IRW:[id number] DOB: 12/25/1955 DOA: 06/15/2020 PCP: [name], MD   Brief Narrative:  Jerry [name]  is a 64 y.o. AA male with a known history of BPH, CHF, COPD, hypertension, colorectal carcinoma and rheumatoid arthritis, presented to the emergency room with acute onset of nausea without significant vomiting as well as diarrhea and abdominal pain.  Symptoms have been going on over the last couple of days at [facility] where he resides.  He has been having occasional cough without wheezing or dyspnea chest pain or palpitations.  No bilious vomitus or hematemesis.  No dysuria, oliguria or hematuria or flank pain.  CT abdomen with increasing mass involving the ascending colon.  Not a surgical candidate.  Palliative care was consulted.  Patient has a legal guardian.  Subjective: No nausea today.  Pt tolerating more fluids.  Pt asked to have a grilled cheese sandwich, sausage and hot dog.     Assessment & Plan:   Active Problems:   Lower urinary tract infectious disease   Abdominal pain   Nausea vomiting and diarrhea   Leukocytosis   Colorectal cancer (HCC)  N/V and Abdominal pain  secondary to obstruction from enlarging ascending colon mass history of colorectal cancer.   Surgery was consulted and he is not a surgical candidate.  There was some initial concern of sepsis due to hypotension, tachycardia and leukocytosis.  Markedly elevated procalcitonin at 132.  Which started improving.  Leukocytosis improving.  Blood cultures remain negative, urine culture with more than 60,000 colonies of E. coli.  He was started on meropenem and vancomycin (d/c'ed) PLAN: -continue meropenem, day 7, for possible intra-abdominal infection.  UTI.   Urine culture with more than 60,000 colonies of E. coli.   --continue abx as above  AKI.  Resolved.   -continue MIVF@75  since pt is not taking in enough oral hydration.  Hypoglycemia 2/2 poor PO intake --BG in 50's.   Not a candidate for tube feed due to distal obstruction. --continue MIVF with D5 1/2NS since pt is not taking in enough oral intake. --BG q6h and hypoglycemic protocol --advance diet today since pt is asking to eat  Hypokalemia.  Resolved.  Hypertension.   Blood pressure within goal. -continue home metop, lasix and hydralazine  Hypothyroidism.   -T4 wnl -Continue Synthroid  Rheumatoid arthritis. -Continue home dose of Plaquenil.   Objective: Vitals:   06/20/20 0100 06/20/20 0443 06/20/20 0714 06/20/20 1210  BP: (!) 137/82 (!) 134/82 (!) 136/81 128/81  Pulse: 94 51 78 82  Resp: 16 16 16 18   Temp: 99.3 F (37.4 C) 98.9 F (37.2 C) 98.7 F (37.1 C) 99.1 F (37.3 C)  TempSrc: Oral Oral Oral Oral  SpO2: 96% 99% 97% 98%  Weight:      Height:        Intake/Output Summary (Last 24 hours) at 06/20/2020 1317 Last data filed at 06/20/2020 1240 Gross per 24 hour  Intake 2177.45 ml  Output 200 ml  Net 1977.45 ml   Filed Weights   06/15/20 0507  Weight: 122.9 kg    Examination:  Constitutional: NAD, alert, oriented to self, more calm today. HEENT: conjunctivae and lids normal, EOMI CV: RRR no M,R,G. Distal pulses +2.  No cyanosis.   RESP: CTA B/L, normal respiratory effort, on 3L GI: +BS, NTND, soft Extremities: generalized swelling in all extremities SKIN: warm, dry  Neuro: II - XII grossly intact.  Sensation intact    DVT prophylaxis: Lovenox Code  Status: DNR Family Communication:  Disposition Plan:  Status is: Inpatient  Remains inpatient appropriate because:Inpatient level of care appropriate due to severity of illness   Dispo: The patient is from: SNF              Anticipated d/c is to: Likely hospice facility, pending discussion with guardian.              Anticipated d/c date is: > 3 days              Patient currently is not medically stable to d/c.  On IV abx for sepsis, and currently can not take in enough oral intake to sustain  life.   Consultants:   Surgery  Palliative care  Procedures:  Antimicrobials:  Meropenem  Data Reviewed: I have personally reviewed following labs and imaging studies  CBC: Recent Labs  Lab 06/15/20 0048 06/15/20 0558 06/16/20 0420 06/17/20 0407 06/18/20 0606 06/19/20 0347 06/20/20 0532  WBC 24.4*   < > 15.9* 12.5* 10.6* 8.1 6.4  NEUTROABS 19.2*  --   --   --   --   --   --   HGB 9.7*   < > 8.8* 9.2* 9.2* 9.7* 9.4*  HCT 30.2*   < > 27.9* 28.4* 28.5* 30.9* 28.6*  MCV 83.4   < > 84.0 82.3 82.4 82.4 80.6  PLT 155   < > 138* 137* 141* 166 178   < > = values in this interval not displayed.   Basic Metabolic Panel: Recent Labs  Lab 06/15/20 0558 06/15/20 0558 06/16/20 0420 06/17/20 0407 06/18/20 0606 06/19/20 0347 06/20/20 0532  NA 141   < > 139 145 146* 141 138  K 3.4*   < > 3.3* 3.5 3.5 3.4* 4.2  CL 102   < > 105 111 108 107 106  CO2 28   < > 26 27 26 30 26   GLUCOSE 53*   < > 90 90 66* 100* 88  BUN 26*   < > 23 13 10 8  6*  CREATININE 1.22   < > 0.63 0.52* 0.45* 0.38* 0.41*  CALCIUM 8.4*   < > 8.1* 8.0* 8.3* 8.2* 7.9*  MG 1.7  --   --   --   --  1.7 1.6*   < > = values in this interval not displayed.   GFR: Estimated Creatinine Clearance: 130 mL/min (A) (by C-G formula based on SCr of 0.41 mg/dL (L)). Liver Function Tests: Recent Labs  Lab 06/15/20 0048  AST 25  ALT 16  ALKPHOS 90  BILITOT 0.7  PROT 7.3  ALBUMIN 2.7*   Recent Labs  Lab 06/15/20 0048  LIPASE 22   No results for input(s): AMMONIA in the last 168 hours. Coagulation Profile: Recent Labs  Lab 06/15/20 0558  INR 1.4*   Cardiac Enzymes: No results for input(s): CKTOTAL, CKMB, CKMBINDEX, TROPONINI in the last 168 hours. BNP (last 3 results) No results for input(s): PROBNP in the last 8760 hours. HbA1C: No results for input(s): HGBA1C in the last 72 hours. CBG: Recent Labs  Lab 06/19/20 1723 06/19/20 2036 06/20/20 0058 06/20/20 0443 06/20/20 1212  GLUCAP 74 90 86 85 74    Lipid Profile: No results for input(s): CHOL, HDL, LDLCALC, TRIG, CHOLHDL, LDLDIRECT in the last 72 hours. Thyroid Function Tests: No results for input(s): TSH, T4TOTAL, FREET4, T3FREE, THYROIDAB in the last 72 hours. Anemia Panel: No results for input(s): VITAMINB12, FOLATE, FERRITIN, TIBC, IRON, RETICCTPCT in the last 72  hours. Sepsis Labs: Recent Labs  Lab 06/15/20 0048 06/15/20 0558 06/16/20 1837 06/17/20 0407 06/18/20 0606  PROCALCITON  --  132.27 28.46 22.24 7.57  LATICACIDVEN 1.2 1.2  --   --   --     Recent Results (from the past 240 hour(s))  Culture, blood (routine x 2)     Status: Abnormal   Collection Time: 06/15/20 12:44 AM   Specimen: BLOOD LEFT WRIST  Result Value Ref Range Status   Specimen Description   Final    BLOOD LEFT WRIST Performed at [facility], [address], [place], [place] [address]    Special Requests   Final    BOTTLES DRAWN AEROBIC AND ANAEROBIC Blood Culture adequate volume Performed at [facility], [address] [place], [place] [address]    Culture  Setup Time   Final    GRAM NEGATIVE RODS ANAEROBIC BOTTLE ONLY CRITICAL RESULT CALLED TO, READ BACK BY AND VERIFIED WITH: JASON ROBBINS AT 2113 ON 06/17/20 RWW Performed at [facility], [place] [address] [place], [place] [address]    Culture PROVIDENCIA STUARTII (A)  Final   Report Status 06/20/2020 FINAL  Final   Organism ID, Bacteria PROVIDENCIA STUARTII  Final      Susceptibility   Providencia stuartii - MIC*    AMPICILLIN >=32 RESISTANT Resistant     CEFAZOLIN >=64 RESISTANT Resistant     CEFEPIME 0.25 SENSITIVE Sensitive     CEFTAZIDIME <=1 SENSITIVE Sensitive     CEFTRIAXONE 0.5 SENSITIVE Sensitive     CIPROFLOXACIN 2 INTERMEDIATE Intermediate     GENTAMICIN RESISTANT Resistant     IMIPENEM 2 SENSITIVE Sensitive     TRIMETH/SULFA >=320 RESISTANT Resistant     AMPICILLIN/SULBACTAM >=32 RESISTANT Resistant     PIP/TAZO 8 SENSITIVE Sensitive     *  PROVIDENCIA STUARTII  Blood Culture ID Panel (Reflexed)     Status: None   Collection Time: 06/15/20 12:44 AM  Result Value Ref Range Status   Enterococcus species NOT DETECTED NOT DETECTED Final   Listeria monocytogenes NOT DETECTED NOT DETECTED Final   Staphylococcus species NOT DETECTED NOT DETECTED Final   Staphylococcus aureus (BCID) NOT DETECTED NOT DETECTED Final   Streptococcus species NOT DETECTED NOT DETECTED Final   Streptococcus agalactiae NOT DETECTED NOT DETECTED Final   Streptococcus pneumoniae NOT DETECTED NOT DETECTED Final   Streptococcus pyogenes NOT DETECTED NOT DETECTED Final   Acinetobacter baumannii NOT DETECTED NOT DETECTED Final   Enterobacteriaceae species NOT DETECTED NOT DETECTED Final   Enterobacter cloacae complex NOT DETECTED NOT DETECTED Final   Escherichia coli NOT DETECTED NOT DETECTED Final   Klebsiella oxytoca NOT DETECTED NOT DETECTED Final   Klebsiella pneumoniae NOT DETECTED NOT DETECTED Final   Proteus species NOT DETECTED NOT DETECTED Final   Serratia marcescens NOT DETECTED NOT DETECTED Final   Haemophilus influenzae NOT DETECTED NOT DETECTED Final   Neisseria meningitidis NOT DETECTED NOT DETECTED Final   Pseudomonas aeruginosa NOT DETECTED NOT DETECTED Final   Candida albicans NOT DETECTED NOT DETECTED Final   Candida glabrata NOT DETECTED NOT DETECTED Final   Candida krusei NOT DETECTED NOT DETECTED Final   Candida parapsilosis NOT DETECTED NOT DETECTED Final   Candida tropicalis NOT DETECTED NOT DETECTED Final    Comment: Performed at [facility], [address], [place], [place] [address]  Urine culture     Status: Abnormal   Collection Time: 06/15/20 12:48 AM   Specimen: Urine, Clean Catch  Result Value Ref Range Status   Specimen Description   Final    URINE, CLEAN CATCH Performed at [facility], [address] [place], [place] [address]    Special Requests   Final    NONE Performed at [facility], [address] [place], [place] [address]    Culture (A)  Final    60,000 COLONIES/mL ESCHERICHIA COLI Confirmed Extended Spectrum Beta-Lactamase Producer (ESBL).  In bloodstream infections from ESBL organisms, carbapenems are preferred over piperacillin/tazobactam. They are shown to have a lower risk of mortality.    Report Status 06/18/2020 FINAL  Final   Organism ID, Bacteria ESCHERICHIA COLI (A)  Final      Susceptibility   Escherichia coli - MIC*    AMPICILLIN >=32 RESISTANT Resistant     CEFAZOLIN >=64 RESISTANT Resistant     CEFTRIAXONE >=64 RESISTANT Resistant     CIPROFLOXACIN >=4 RESISTANT Resistant     GENTAMICIN <=1 SENSITIVE Sensitive     IMIPENEM <=0.25 SENSITIVE Sensitive     NITROFURANTOIN <=16 SENSITIVE Sensitive     TRIMETH/SULFA <=20 SENSITIVE Sensitive     AMPICILLIN/SULBACTAM 4 SENSITIVE Sensitive     PIP/TAZO <=4 SENSITIVE Sensitive     * 60,000 COLONIES/mL ESCHERICHIA COLI  Culture, blood (routine x 2)     Status: None   Collection Time: 06/15/20 12:48 AM   Specimen: BLOOD RIGHT FOREARM  Result Value Ref Range Status   Specimen Description BLOOD RIGHT FOREARM  Final   Special Requests   Final    BOTTLES DRAWN AEROBIC AND ANAEROBIC Blood Culture adequate volume   Culture   Final    NO GROWTH 5 DAYS Performed at [facility], [address] [place], [place] [address]    Report Status 06/20/2020 FINAL  Final  SARS Coronavirus 2 by RT PCR (hospital order, performed in [facility] hospital lab) Nasopharyngeal Nasopharyngeal Swab     Status: None   Collection Time: 06/15/20  2:05 AM   Specimen: Nasopharyngeal Swab  Result Value Ref Range Status   SARS Coronavirus 2 NEGATIVE NEGATIVE Final    Comment: (NOTE) SARS-CoV-2 target nucleic acids are NOT DETECTED.  The SARS-CoV-2 RNA is generally detectable in upper and lower respiratory specimens during the acute phase of infection. The lowest concentration of SARS-CoV-2 viral copies this assay  can detect is 250 copies / mL. A negative result does not preclude SARS-CoV-2 infection and should not be used as the sole basis for treatment or other patient management decisions.  A negative result may occur with improper specimen collection / handling, submission of specimen other than nasopharyngeal swab, presence of viral mutation(s) within the areas targeted by this assay, and inadequate number of viral copies (<250 copies / mL). A negative result must be combined with clinical observations, patient history, and epidemiological information.  Fact Sheet for Patients:   StrictlyIdeas.no  Fact Sheet for Healthcare Providers: BankingDealers.co.za  This test is not yet approved or  cleared by the [place] FDA and has been authorized for detection and/or diagnosis of SARS-CoV-2 by FDA under an Emergency Use Authorization (EUA).  This EUA will remain in effect (meaning this test can be used) for the duration of the COVID-19 declaration under Section 564(b)(1) of the Act, 21 U.S.C. section 360bbb-3(b)(1), unless the authorization is terminated or revoked sooner.  Performed at [facility], [address], [place], [place] [address]      Radiology Studies: No results found.  Scheduled Meds: . benztropine  0.5 mg Oral BID  . Chlorhexidine Gluconate Cloth  6 each Topical Daily  . docusate sodium  100 mg Oral BID  . enoxaparin (LOVENOX) injection  40 mg Subcutaneous Q24H  . feeding supplement (PRO-STAT SUGAR FREE 64)  30 mL Oral TID WC  . ferrous sulfate  325 mg Oral BID WC  . fluPHENAZine  15 mg Oral BID  . furosemide  20 mg Oral BID  . hydrALAZINE  50 mg Oral QID  . hydrocortisone  10 mg Oral BID  . hydroxychloroquine  200 mg Oral BID  . levothyroxine  200 mcg Oral QAC breakfast  . metoprolol tartrate  75 mg Oral BID  . multivitamin with minerals  1 tablet Oral Daily  . pantoprazole  40 mg Oral BH-q7a  .  polyethylene glycol  17 g Oral Daily  . potassium chloride  10 mEq Oral Daily  . tamsulosin  0.4 mg Oral QPC breakfast   Continuous Infusions: . dextrose 5 % and 0.45 % NaCl with KCl 40 mEq/L 75 mL/hr at 06/20/20 1240  . meropenem (MERREM) IV Stopped (06/20/20 0533)  . methocarbamol (ROBAXIN) IV Stopped (06/20/20 0536)     LOS: 5 days    Enzo Bi, MD [facility]  If 7PM-7AM, please contact night-coverage [email or web address]  06/20/2020, 1:17 PM

## 2020-06-21 DIAGNOSIS — R112 Nausea with vomiting, unspecified: Secondary | ICD-10-CM | POA: Diagnosis not present

## 2020-06-21 DIAGNOSIS — Z515 Encounter for palliative care: Secondary | ICD-10-CM | POA: Diagnosis not present

## 2020-06-21 DIAGNOSIS — R109 Unspecified abdominal pain: Secondary | ICD-10-CM | POA: Diagnosis not present

## 2020-06-21 DIAGNOSIS — C19 Malignant neoplasm of rectosigmoid junction: Secondary | ICD-10-CM | POA: Diagnosis not present

## 2020-06-21 DIAGNOSIS — Z66 Do not resuscitate: Secondary | ICD-10-CM

## 2020-06-21 DIAGNOSIS — R197 Diarrhea, unspecified: Secondary | ICD-10-CM | POA: Diagnosis not present

## 2020-06-21 LAB — BASIC METABOLIC PANEL
Anion gap: 4 — ABNORMAL LOW (ref 5–15)
BUN: <5 mg/dL — ABNORMAL LOW (ref 8–23)
CO2: 29 mmol/L (ref 22–32)
Calcium: 7.5 mg/dL — ABNORMAL LOW (ref 8.9–10.3)
Chloride: 107 mmol/L (ref 98–111)
Creatinine, Ser: 0.35 mg/dL — ABNORMAL LOW (ref 0.61–1.24)
GFR calc Af Amer: >60 mL/min (ref >60)
GFR calc non Af Amer: >60 mL/min (ref >60)
Glucose, Bld: 86 mg/dL (ref 70–99)
Potassium: 4.1 mmol/L (ref 3.5–5.1)
Sodium: 140 mmol/L (ref 135–145)

## 2020-06-21 LAB — MRSA PCR SCREENING: MRSA by PCR: POSITIVE — AB

## 2020-06-21 LAB — MAGNESIUM: Magnesium: 1.9 mg/dL (ref 1.7–2.4)

## 2020-06-21 LAB — CBC
HCT: 30 % — ABNORMAL LOW (ref 39.0–52.0)
Hemoglobin: 9.4 g/dL — ABNORMAL LOW (ref 13.0–17.0)
MCH: 26.1 pg (ref 26.0–34.0)
MCHC: 31.3 g/dL (ref 30.0–36.0)
MCV: 83.3 fL (ref 80.0–100.0)
Platelets: 200 10*3/uL (ref 150–400)
RBC: 3.6 MIL/uL — ABNORMAL LOW (ref 4.22–5.81)
RDW: 16.5 % — ABNORMAL HIGH (ref 11.5–15.5)
WBC: 7.3 10*3/uL (ref 4.0–10.5)
nRBC: 0 % (ref 0.0–0.2)

## 2020-06-21 MED ORDER — MUPIROCIN 2 % EX OINT
TOPICAL_OINTMENT | Freq: Two times a day (BID) | CUTANEOUS | Status: DC
  Filled 2020-06-21: qty 22

## 2020-06-21 NOTE — Progress Notes (Signed)
Daily Progress Note   Patient Name: Jerry [name]       Date: 06/21/2020 DOB: 1956/08/23  Age: 64 y.o. MRN#: [id number] Attending Physician: Enzo Bi, MD Primary Care Physician: [name], MD Admit Date: 06/15/2020  Reason for Consultation/Follow-up: Disposition and Establishing goals of care  Subjective: Chart review performed. Received report from primary RN. RN had no acute concerns. She stated the patient was drinking small amounts. RN reported the patient took his pills this morning, compared to Friday when he was refusing all pills.   Went to visit patient at bedside - no family present. Patient was asleep but woke up to voice and gentle touch. He seemed alert and oriented at the start of conversation - he knew his name, DOB, where he was, year, and medical situation. Attempted [place] conversation. As conversation progressed his judgement appeared to become compromised as he started talking about children "hanging from his legs."   Mr. Radu stated he was "very full" and not hungry. Denied pain. No gestures or non-verbal signs of discomfort noted. He did stated he was nauseated - PRN zofran had recently been administered by RN.  Attempted to connect with [name] Maris-Ray/legal guardian again today. Called and left voicemail - no return call yet as of this time.   Length of Stay: 6  Current Medications: Scheduled Meds:   benztropine  0.5 mg Oral BID   Chlorhexidine Gluconate Cloth  6 each Topical Daily   docusate sodium  100 mg Oral BID   enoxaparin (LOVENOX) injection  40 mg Subcutaneous Q24H   feeding supplement (PRO-STAT SUGAR FREE 64)  30 mL Oral TID WC   ferrous sulfate  325 mg Oral BID WC   fluPHENAZine  15 mg Oral BID   furosemide  20 mg Oral BID   hydrALAZINE  50 mg  Oral QID   hydrocortisone  10 mg Oral BID   hydroxychloroquine  200 mg Oral BID   levothyroxine  200 mcg Oral QAC breakfast   metoprolol tartrate  75 mg Oral BID   multivitamin with minerals  1 tablet Oral Daily   pantoprazole  40 mg Oral BH-q7a   polyethylene glycol  17 g Oral Daily   potassium chloride  10 mEq Oral Daily   tamsulosin  0.4 mg Oral QPC breakfast    Continuous Infusions:  dextrose 5 % and 0.45 % NaCl with KCl 40 mEq/L 50 mL/hr at 06/21/20 1003   meropenem (MERREM) IV 1 g (06/21/20 1444)   methocarbamol (ROBAXIN) IV 500 mg (06/21/20 0600)    PRN Meds: acetaminophen **OR** acetaminophen, albuterol, LORazepam, magnesium hydroxide, morphine injection, ondansetron **OR** ondansetron (ZOFRAN) IV, promethazine, traMADol, traZODone  Physical Exam Vitals and nursing note reviewed.  Constitutional:      General: He is not in acute distress. Pulmonary:     Effort: Pulmonary effort is normal. No respiratory distress.  Skin:    General: Skin is warm and dry.  Neurological:     Mental Status: He is alert.     Motor: Weakness present.  Psychiatric:        Cognition and Memory: Cognition is impaired.             Vital Signs: BP (!) 121/88 (BP Location: Left Arm)    Pulse 89    Temp 98.4 F (36.9 C)    Resp 16    Ht 6\' 2"  (1.88 m)    Wt 122.9 kg    SpO2 97%    BMI 34.79 kg/m  SpO2: SpO2: 97 % O2 Device: O2 Device: Nasal Cannula O2 Flow Rate: O2 Flow Rate (L/min): 2 L/min  Intake/output summary:   Intake/Output Summary (Last 24 hours) at 06/21/2020 1531 Last data filed at 06/21/2020 1255 Gross per 24 hour  Intake 1322.52 ml  Output 1600 ml  Net -277.48 ml   LBM: Last BM Date: 06/20/20 Baseline Weight: Weight: 122.9 kg Most recent weight: Weight: 122.9 kg       Palliative Assessment/Data: PPS 30%      Patient Active Problem List   Diagnosis Date Noted   Nausea vomiting and diarrhea    Leukocytosis    Colorectal cancer (HCC)    Abdominal  pain 06/15/2020   Iron deficiency anemia due to chronic blood loss 12/10/2019   Colon adenocarcinoma ([place]) 11/08/2019   DNR (do not resuscitate) 11/08/2019   Chronic diastolic CHF (congestive heart failure) ([place]) 11/08/2019   Rheumatoid arthritis ([place]) 11/08/2019   Chronic respiratory failure with hypoxia ([place]) 11/08/2019   Palliative care encounter    Mass of colon 11/04/2019   Schizo affective schizophrenia ([place])    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 ([place]) 10/30/2019   Primary adrenocortical insufficiency ([place]) 10/30/2019   Anemia 10/30/2019   Gastrointestinal hemorrhage    Chronic obstructive pulmonary disease ([place])    COVID-19 virus infection 09/11/2019   Normocytic anemia 08/25/2019   Community acquired pneumonia    DNR (do not resuscitate) discussion    SOB (shortness of breath) 07/16/2019   Obesity, Class III, BMI 40-49.9 (morbid obesity) ([place])    Schizophrenia (HCC)    Palliative care by specialist    Advance care planning    Goals of care, counseling/discussion    CHF (congestive heart failure) ([place])    Altered mental status    Septic shock ([place]) 08/02/2017   Lower urinary tract infectious disease    Pneumonia 06/21/2017   Thrombocytopenia ([place]) 06/21/2017   Leukopenia 06/21/2017   Pressure injury of skin 06/21/2017    Palliative Care Assessment & Plan   Patient Profile: [name] a37 y.o.malewith a known history of BPH, CHF, COPD, hypertension, colorectal carcinomaand rheumatoid arthritis, presented to the emergency room with acute onset of nausea without significant vomiting as well as diarrhea and abdominal pain.  Assessment: UTI Abdominal pain Obstruction from enlarging ascending colon mass History of colorectal cancer AKI  Recommendations/Plan:  Continue current medical treatment  Continue DNR/DNI as previously documented  Attempted to reach out to legal guardian/[name] again today with no return phone  call   Patient continues to be a candidate for hospice facility placement if [name]/legal guardian is interested in comfort focused care  PMT will continue to follow holistically  Goals of Care and Additional Recommendations:  Limitations on Scope of Treatment: Full Scope Treatment  Code Status:    Code Status Orders  (From admission, onward)         Start     Ordered   06/16/20 1250  Do not attempt resuscitation (DNR)  Continuous       Question Answer Comment  In the event of cardiac or respiratory ARREST Do not call a code blue   In the event of cardiac or respiratory ARREST Do not perform Intubation, CPR, defibrillation or ACLS   In the event of cardiac or respiratory ARREST Use medication by any route, position, wound care, and other measures to relive pain and suffering. May use oxygen, suction and manual treatment of airway obstruction as needed for comfort.   Comments d/w niece [name] and MOST form in [place]      06/16/20 1250        Code Status History    Date Active Date Inactive Code Status Order ID Comments User Context   06/15/2020 1217 06/16/2020 1250 Partial Code [id number]  Christel Mormon, MD Inpatient   06/15/2020 0452 06/15/2020 1217 DNR [id number]  Christel Mormon, MD ED   11/10/2019 2015 11/20/2019 1823 DNR [id number]  Kathie Dike, MD Inpatient   10/30/2019 1525 11/07/2019 1004 DNR [id number]  Fritzi Mandes, MD ED   09/11/2019 1024 09/16/2019 2020 DNR [id number]  Thurnell Lose, MD Inpatient   07/25/2019 1636 07/28/2019 2043 DNR [id number]  Hillary Bow, MD Inpatient   07/16/2019 1733 07/25/2019 1636 Full Code [id number]  Dustin Flock, MD ED   12/25/2017 2105 12/30/2017 1646 Full Code [id number]  Vaughan Basta, MD Inpatient   08/02/2017 1447 08/13/2017 1844 Full Code [id number]  Bettey Costa, MD Inpatient   06/21/2017 1527 07/02/2017 1628 Full Code [id number]  Theodoro Grist, MD Inpatient   Advance Care Planning Activity       Prognosis:   Unable to  determine  Discharge Planning:  To Be Determined  Care plan was discussed with primary RN, Dr. Billie Ruddy  Thank you for allowing the Palliative Medicine Team to assist in the care of this patient.   Total Time 15 minutes Prolonged Time Billed  no       Greater than 50%  of this time was spent counseling and coordinating care related to the above assessment and plan.  Lin Landsman, NP  Please contact Palliative Medicine Team phone at [phone] for questions and concerns.

## 2020-06-21 NOTE — Progress Notes (Signed)
PROGRESS NOTE    Jerry [name]  SWN:[id number] DOB: [birth year] DOA: 06/15/2020 PCP: [name], MD   Brief Narrative:  Jerry [name]  is a 64 y.o. AA male with a known history of BPH, CHF, COPD, hypertension, colorectal carcinoma and rheumatoid arthritis, presented to the emergency room with acute onset of nausea without significant vomiting as well as diarrhea and abdominal pain.  Symptoms have been going on over the last couple of days at [facility] where he resides.  He has been having occasional cough without wheezing or dyspnea chest pain or palpitations.  No bilious vomitus or hematemesis.  No dysuria, oliguria or hematuria or flank pain.  CT abdomen with increasing mass involving the ascending colon.  Not a surgical candidate.  Palliative care was consulted.  Patient has a legal guardian.  Subjective: Pt asked for various food items but had not been eating any solids.  Incontinent of stools.    Still not able to reach Guardian to discuss goals of care.   Assessment & Plan:   Active Problems:   Lower urinary tract infectious disease   Abdominal pain   Nausea vomiting and diarrhea   Leukocytosis   Colorectal cancer (HCC)  N/V and Abdominal pain  secondary to obstruction from enlarging ascending colon mass history of colorectal cancer.   Surgery was consulted and he is not a surgical candidate.  There was some initial concern of sepsis due to hypotension, tachycardia and leukocytosis.  Markedly elevated procalcitonin at 132.  Which started improving.  Leukocytosis improving.  Blood cultures remain negative, urine culture with more than 60,000 colonies of E. coli.  He was started on meropenem and vancomycin (d/c'ed) PLAN: -continue meropenem, day 7 of 7, for possible intra-abdominal infection.  UTI.   Urine culture with more than 60,000 colonies of E. coli.   --continue abx as above, considered treated.  AKI.  Resolved.   -reduced MIVF to 50 ml/hr.    Hypoglycemia 2/2  poor PO intake --BG in 50's.  Not a candidate for tube feed due to distal obstruction. --continue MIVF with D5 1/2NS since pt is not taking in enough oral intake. --BG q6h and hypoglycemic protocol  Hypokalemia.  Resolved.  Hypertension.   Blood pressure within goal. -continue home metop, lasix and hydralazine  Hypothyroidism.   -T4 wnl -Continue Synthroid  Rheumatoid arthritis. -Continue home dose of Plaquenil.   Objective: Vitals:   06/20/20 1931 06/20/20 2331 06/21/20 0346 06/21/20 1448  BP: 118/72 114/72 126/76 (!) 121/88  Pulse: 100 94 89 89  Resp: 18 16 16 16   Temp: 100.3 F (37.9 C) 99.4 F (37.4 C) 98.7 F (37.1 C) 98.4 F (36.9 C)  TempSrc: Oral Oral Oral   SpO2: 97% 99% 94% 97%  Weight:      Height:        Intake/Output Summary (Last 24 hours) at 06/21/2020 1854 Last data filed at 06/21/2020 1725 Gross per 24 hour  Intake 1856.93 ml  Output 1600 ml  Net 256.93 ml   Filed Weights   06/15/20 0507  Weight: 122.9 kg    Examination:  Constitutional: NAD, arousable, talking to himself, but will engage other people in conversation. Confused. HEENT: conjunctivae and lids normal, EOMI CV: No cyanosis.   RESP: normal respiratory effort, on 3L SKIN: warm, dry  Neuro: II - XII grossly intact.     DVT prophylaxis: Lovenox Code Status: DNR Family Communication: can not reach guardian Disposition Plan:  Status is: Inpatient  Remains inpatient appropriate because:Inpatient  level of care appropriate due to severity of illness   Dispo: The patient is from: SNF              Anticipated d/c is to: Likely hospice facility, pending discussion with guardian.              Anticipated d/c date is: > 3 days              Patient currently is not medically stable to d/c.  On IV abx for sepsis, and currently can not take in enough oral intake to sustain life.  Can not reach guardian to discuss moving to comfort care or hospice.  Palliative care  involved.   Consultants:   Surgery  Palliative care  Procedures:  Antimicrobials:  Meropenem  Data Reviewed: I have personally reviewed following labs and imaging studies  CBC: Recent Labs  Lab 06/15/20 0048 06/15/20 0558 06/17/20 0407 06/18/20 0606 06/19/20 0347 06/20/20 0532 06/21/20 0630  WBC 24.4*   < > 12.5* 10.6* 8.1 6.4 7.3  NEUTROABS 19.2*  --   --   --   --   --   --   HGB 9.7*   < > 9.2* 9.2* 9.7* 9.4* 9.4*  HCT 30.2*   < > 28.4* 28.5* 30.9* 28.6* 30.0*  MCV 83.4   < > 82.3 82.4 82.4 80.6 83.3  PLT 155   < > 137* 141* 166 178 200   < > = values in this interval not displayed.   Basic Metabolic Panel: Recent Labs  Lab 06/15/20 0558 06/16/20 0420 06/17/20 0407 06/18/20 0606 06/19/20 0347 06/20/20 0532 06/21/20 0630  NA 141   < > 145 146* 141 138 140  K 3.4*   < > 3.5 3.5 3.4* 4.2 4.1  CL 102   < > 111 108 107 106 107  CO2 28   < > 27 26 30 26 29   GLUCOSE 53*   < > 90 66* 100* 88 86  BUN 26*   < > 13 10 8  6* <5*  CREATININE 1.22   < > 0.52* 0.45* 0.38* 0.41* 0.35*  CALCIUM 8.4*   < > 8.0* 8.3* 8.2* 7.9* 7.5*  MG 1.7  --   --   --  1.7 1.6* 1.9   < > = values in this interval not displayed.   GFR: Estimated Creatinine Clearance: 130 mL/min (A) (by C-G formula based on SCr of 0.35 mg/dL (L)). Liver Function Tests: Recent Labs  Lab 06/15/20 0048  AST 25  ALT 16  ALKPHOS 90  BILITOT 0.7  PROT 7.3  ALBUMIN 2.7*   Recent Labs  Lab 06/15/20 0048  LIPASE 22   No results for input(s): AMMONIA in the last 168 hours. Coagulation Profile: Recent Labs  Lab 06/15/20 0558  INR 1.4*   Cardiac Enzymes: No results for input(s): CKTOTAL, CKMB, CKMBINDEX, TROPONINI in the last 168 hours. BNP (last 3 results) No results for input(s): PROBNP in the last 8760 hours. HbA1C: No results for input(s): HGBA1C in the last 72 hours. CBG: Recent Labs  Lab 06/20/20 0058 06/20/20 0443 06/20/20 1212 06/20/20 1919 06/20/20 2329  GLUCAP 86 85 74 80 75    Lipid Profile: No results for input(s): CHOL, HDL, LDLCALC, TRIG, CHOLHDL, LDLDIRECT in the last 72 hours. Thyroid Function Tests: No results for input(s): TSH, T4TOTAL, FREET4, T3FREE, THYROIDAB in the last 72 hours. Anemia Panel: No results for input(s): VITAMINB12, FOLATE, FERRITIN, TIBC, IRON, RETICCTPCT in the last 72 hours.  Sepsis Labs: Recent Labs  Lab 06/15/20 0048 06/15/20 0558 06/16/20 1837 06/17/20 0407 06/18/20 0606  PROCALCITON  --  132.27 28.46 22.24 7.57  LATICACIDVEN 1.2 1.2  --   --   --     Recent Results (from the past 240 hour(s))  Culture, blood (routine x 2)     Status: Abnormal   Collection Time: 06/15/20 12:44 AM   Specimen: BLOOD LEFT WRIST  Result Value Ref Range Status   Specimen Description   Final    BLOOD LEFT WRIST Performed at [facility], [address] [place], [place] [address]    Special Requests   Final    BOTTLES DRAWN AEROBIC AND ANAEROBIC Blood Culture adequate volume Performed at [facility], [address] [place], [place] [address]    Culture  Setup Time   Final    GRAM NEGATIVE RODS ANAEROBIC BOTTLE ONLY CRITICAL RESULT CALLED TO, READ BACK BY AND VERIFIED WITH: JASON ROBBINS AT 2113 ON 06/17/20 RWW Performed at [facility], [place] [address] [place], [place] [address]    Culture PROVIDENCIA STUARTII (A)  Final   Report Status 06/20/2020 FINAL  Final   Organism ID, Bacteria PROVIDENCIA STUARTII  Final      Susceptibility   Providencia stuartii - MIC*    AMPICILLIN >=32 RESISTANT Resistant     CEFAZOLIN >=64 RESISTANT Resistant     CEFEPIME 0.25 SENSITIVE Sensitive     CEFTAZIDIME <=1 SENSITIVE Sensitive     CEFTRIAXONE 0.5 SENSITIVE Sensitive     CIPROFLOXACIN 2 INTERMEDIATE Intermediate     GENTAMICIN RESISTANT Resistant     IMIPENEM 2 SENSITIVE Sensitive     TRIMETH/SULFA >=320 RESISTANT Resistant     AMPICILLIN/SULBACTAM >=32 RESISTANT Resistant     PIP/TAZO 8 SENSITIVE Sensitive     *  PROVIDENCIA STUARTII  Blood Culture ID Panel (Reflexed)     Status: None   Collection Time: 06/15/20 12:44 AM  Result Value Ref Range Status   Enterococcus species NOT DETECTED NOT DETECTED Final   Listeria monocytogenes NOT DETECTED NOT DETECTED Final   Staphylococcus species NOT DETECTED NOT DETECTED Final   Staphylococcus aureus (BCID) NOT DETECTED NOT DETECTED Final   Streptococcus species NOT DETECTED NOT DETECTED Final   Streptococcus agalactiae NOT DETECTED NOT DETECTED Final   Streptococcus pneumoniae NOT DETECTED NOT DETECTED Final   Streptococcus pyogenes NOT DETECTED NOT DETECTED Final   Acinetobacter baumannii NOT DETECTED NOT DETECTED Final   Enterobacteriaceae species NOT DETECTED NOT DETECTED Final   Enterobacter cloacae complex NOT DETECTED NOT DETECTED Final   Escherichia coli NOT DETECTED NOT DETECTED Final   Klebsiella oxytoca NOT DETECTED NOT DETECTED Final   Klebsiella pneumoniae NOT DETECTED NOT DETECTED Final   Proteus species NOT DETECTED NOT DETECTED Final   Serratia marcescens NOT DETECTED NOT DETECTED Final   Haemophilus influenzae NOT DETECTED NOT DETECTED Final   Neisseria meningitidis NOT DETECTED NOT DETECTED Final   Pseudomonas aeruginosa NOT DETECTED NOT DETECTED Final   Candida albicans NOT DETECTED NOT DETECTED Final   Candida glabrata NOT DETECTED NOT DETECTED Final   Candida krusei NOT DETECTED NOT DETECTED Final   Candida parapsilosis NOT DETECTED NOT DETECTED Final   Candida tropicalis NOT DETECTED NOT DETECTED Final    Comment: Performed at [facility], [address], [place], [place] [address]  Urine culture     Status: Abnormal   Collection Time: 06/15/20 12:48 AM   Specimen: Urine, Clean Catch  Result  Value Ref Range Status   Specimen Description   Final    URINE, CLEAN CATCH Performed at [facility], [address] [place], [place] [address]    Special Requests   Final    NONE Performed at [facility], [address] [place], [place] [address]    Culture (A)  Final    60,000 COLONIES/mL ESCHERICHIA COLI Confirmed Extended Spectrum Beta-Lactamase Producer (ESBL).  In bloodstream infections from ESBL organisms, carbapenems are preferred over piperacillin/tazobactam. They are shown to have a lower risk of mortality.    Report Status 06/18/2020 FINAL  Final   Organism ID, Bacteria ESCHERICHIA COLI (A)  Final      Susceptibility   Escherichia coli - MIC*    AMPICILLIN >=32 RESISTANT Resistant     CEFAZOLIN >=64 RESISTANT Resistant     CEFTRIAXONE >=64 RESISTANT Resistant     CIPROFLOXACIN >=4 RESISTANT Resistant     GENTAMICIN <=1 SENSITIVE Sensitive     IMIPENEM <=0.25 SENSITIVE Sensitive     NITROFURANTOIN <=16 SENSITIVE Sensitive     TRIMETH/SULFA <=20 SENSITIVE Sensitive     AMPICILLIN/SULBACTAM 4 SENSITIVE Sensitive     PIP/TAZO <=4 SENSITIVE Sensitive     * 60,000 COLONIES/mL ESCHERICHIA COLI  Culture, blood (routine x 2)     Status: None   Collection Time: 06/15/20 12:48 AM   Specimen: BLOOD RIGHT FOREARM  Result Value Ref Range Status   Specimen Description BLOOD RIGHT FOREARM  Final   Special Requests   Final    BOTTLES DRAWN AEROBIC AND ANAEROBIC Blood Culture adequate volume   Culture   Final    NO GROWTH 5 DAYS Performed at [facility], [address] [place], [place] [address]    Report Status 06/20/2020 FINAL  Final  SARS Coronavirus 2 by RT PCR (hospital order, performed in [facility] hospital lab) Nasopharyngeal Nasopharyngeal Swab     Status: None   Collection Time: 06/15/20  2:05 AM   Specimen: Nasopharyngeal Swab  Result Value Ref Range Status   SARS Coronavirus 2 NEGATIVE NEGATIVE Final    Comment: (NOTE) SARS-CoV-2 target nucleic acids are NOT DETECTED.  The SARS-CoV-2 RNA is generally detectable in upper and lower respiratory specimens during the acute phase of infection. The lowest concentration of SARS-CoV-2 viral copies this assay  can detect is 250 copies / mL. A negative result does not preclude SARS-CoV-2 infection and should not be used as the sole basis for treatment or other patient management decisions.  A negative result may occur with improper specimen collection / handling, submission of specimen other than nasopharyngeal swab, presence of viral mutation(s) within the areas targeted by this assay, and inadequate number of viral copies (<250 copies / mL). A negative result must be combined with clinical observations, patient history, and epidemiological information.  Fact Sheet for Patients:   StrictlyIdeas.no  Fact Sheet for Healthcare Providers: BankingDealers.co.za  This test is not yet approved or  cleared by the [place] FDA and has been authorized for detection and/or diagnosis of SARS-CoV-2 by FDA under an Emergency Use Authorization (EUA).  This EUA will remain in effect (meaning this test can be used) for the duration of the COVID-19 declaration under Section 564(b)(1) of the Act, 21 U.S.C. section 360bbb-3(b)(1), unless the authorization is terminated or revoked sooner.  Performed at [facility], [address], [place],  [address]   MRSA PCR Screening     Status: Abnormal   Collection Time: 06/21/20  10:46 AM   Specimen: Nasopharyngeal  Result Value Ref Range Status   MRSA by PCR POSITIVE (A) NEGATIVE Final    Comment:        The GeneXpert MRSA Assay (FDA approved for NASAL specimens only), is one component of a comprehensive MRSA colonization surveillance program. It is not intended to diagnose MRSA infection nor to guide or monitor treatment for MRSA infections. RESULT CALLED TO, READ BACK BY AND VERIFIED WITH: Di Kindle [facility] 06/21/20 AT 1813 BY ACR Performed at [facility], [address] [place], [place] [address]      Radiology Studies: No results found.  Scheduled Meds: . benztropine  0.5  mg Oral BID  . Chlorhexidine Gluconate Cloth  6 each Topical Daily  . docusate sodium  100 mg Oral BID  . enoxaparin (LOVENOX) injection  40 mg Subcutaneous Q24H  . feeding supplement (PRO-STAT SUGAR FREE 64)  30 mL Oral TID WC  . ferrous sulfate  325 mg Oral BID WC  . fluPHENAZine  15 mg Oral BID  . furosemide  20 mg Oral BID  . hydrALAZINE  50 mg Oral QID  . hydrocortisone  10 mg Oral BID  . hydroxychloroquine  200 mg Oral BID  . levothyroxine  200 mcg Oral QAC breakfast  . metoprolol tartrate  75 mg Oral BID  . multivitamin with minerals  1 tablet Oral Daily  . mupirocin ointment   Nasal BID  . pantoprazole  40 mg Oral BH-q7a  . polyethylene glycol  17 g Oral Daily  . potassium chloride  10 mEq Oral Daily  . tamsulosin  0.4 mg Oral QPC breakfast   Continuous Infusions: . dextrose 5 % and 0.45 % NaCl with KCl 40 mEq/L 50 mL/hr at 06/21/20 1725  . meropenem (MERREM) IV Stopped (06/21/20 1514)  . methocarbamol (ROBAXIN) IV Stopped (06/21/20 1657)     LOS: 6 days    Enzo Bi, MD [facility]  If 7PM-7AM, please contact night-coverage [email or web address]  06/21/2020, 6:54 PM

## 2020-06-22 DIAGNOSIS — Z515 Encounter for palliative care: Secondary | ICD-10-CM | POA: Diagnosis not present

## 2020-06-22 DIAGNOSIS — Z7189 Other specified counseling: Secondary | ICD-10-CM | POA: Diagnosis not present

## 2020-06-22 DIAGNOSIS — C19 Malignant neoplasm of rectosigmoid junction: Secondary | ICD-10-CM | POA: Diagnosis not present

## 2020-06-22 LAB — CBC
HCT: 31.8 % — ABNORMAL LOW (ref 39.0–52.0)
Hemoglobin: 9.9 g/dL — ABNORMAL LOW (ref 13.0–17.0)
MCH: 25.7 pg — ABNORMAL LOW (ref 26.0–34.0)
MCHC: 31.1 g/dL (ref 30.0–36.0)
MCV: 82.6 fL (ref 80.0–100.0)
Platelets: 242 10*3/uL (ref 150–400)
RBC: 3.85 MIL/uL — ABNORMAL LOW (ref 4.22–5.81)
RDW: 16.4 % — ABNORMAL HIGH (ref 11.5–15.5)
WBC: 6.7 10*3/uL (ref 4.0–10.5)
nRBC: 0 % (ref 0.0–0.2)

## 2020-06-22 LAB — BASIC METABOLIC PANEL
Anion gap: 6 (ref 5–15)
BUN: <5 mg/dL — ABNORMAL LOW (ref 8–23)
CO2: 29 mmol/L (ref 22–32)
Calcium: 8.1 mg/dL — ABNORMAL LOW (ref 8.9–10.3)
Chloride: 103 mmol/L (ref 98–111)
Creatinine, Ser: 0.34 mg/dL — ABNORMAL LOW (ref 0.61–1.24)
GFR calc Af Amer: >60 mL/min (ref >60)
GFR calc non Af Amer: >60 mL/min (ref >60)
Glucose, Bld: 88 mg/dL (ref 70–99)
Potassium: 4 mmol/L (ref 3.5–5.1)
Sodium: 138 mmol/L (ref 135–145)

## 2020-06-22 LAB — GLUCOSE, CAPILLARY
Glucose-Capillary: 66 mg/dL — ABNORMAL LOW (ref 70–99)
Glucose-Capillary: 70 mg/dL (ref 70–99)
Glucose-Capillary: 86 mg/dL (ref 70–99)

## 2020-06-22 LAB — MAGNESIUM: Magnesium: 1.8 mg/dL (ref 1.7–2.4)

## 2020-06-22 NOTE — Progress Notes (Signed)
Daily Progress Note   Patient Name: Jerry [name]       Date: 06/22/2020 DOB: Apr 24, 1956  Age: 64 y.o. MRN#: [id number] Attending Physician: Enzo Bi, MD Primary Care Physician: [name], MD Admit Date: 06/15/2020  Reason for Consultation/Follow-up: Establishing goals of care  Subjective: Spoke with attending MD about patient's status over the weekend. In with attending MD to see patient. Patient is resting in bed. He is confused stating something is in his pocket. He only has a hospital gown in place. He is noted to have dark diarrhea.   Spoke with niece/HPOA [name]. She states she is unsure of what was happening with her phone since Friday, but she has been out of town in an area with a lot of storms over the weekend.   Discussed patient's poor non-sustaining PO intake and diarrhea. We discussed QOL, and about the ease in which a feeding tube can be removed. She states she will be coming to [facility] tonight, and will come to the hospital tomorrow morning. Will see her at bedside then.     Length of Stay: 7  Current Medications: Scheduled Meds:  . benztropine  0.5 mg Oral BID  . Chlorhexidine Gluconate Cloth  6 each Topical Daily  . docusate sodium  100 mg Oral BID  . enoxaparin (LOVENOX) injection  40 mg Subcutaneous Q24H  . feeding supplement (PRO-STAT SUGAR FREE 64)  30 mL Oral TID WC  . ferrous sulfate  325 mg Oral BID WC  . fluPHENAZine  15 mg Oral BID  . furosemide  20 mg Oral BID  . hydrALAZINE  50 mg Oral QID  . hydrocortisone  10 mg Oral BID  . hydroxychloroquine  200 mg Oral BID  . levothyroxine  200 mcg Oral QAC breakfast  . metoprolol tartrate  75 mg Oral BID  . multivitamin with minerals  1 tablet Oral Daily  . mupirocin ointment   Nasal BID  . pantoprazole  40 mg Oral  BH-q7a  . polyethylene glycol  17 g Oral Daily  . potassium chloride  10 mEq Oral Daily  . tamsulosin  0.4 mg Oral QPC breakfast    Continuous Infusions: . dextrose 5 % and 0.45 % NaCl with KCl 40 mEq/L 50 mL/hr at 06/22/20 0946    PRN Meds: acetaminophen **OR** acetaminophen, albuterol, LORazepam,  magnesium hydroxide, morphine injection, ondansetron **OR** ondansetron (ZOFRAN) IV, promethazine, traMADol, traZODone  Physical Exam Pulmonary:     Effort: Pulmonary effort is normal.  Neurological:     Mental Status: He is alert.             Vital Signs: BP (!) 136/66 (BP Location: Left Arm)   Pulse 68   Temp 99.1 F (37.3 C) (Oral)   Resp 14   Ht 6\' 2"  (1.88 m)   Wt 122.9 kg   SpO2 95%   BMI 34.79 kg/m  SpO2: SpO2: 95 % O2 Device: O2 Device: Room Air O2 Flow Rate: O2 Flow Rate (L/min): 2 L/min  Intake/output summary:   Intake/Output Summary (Last 24 hours) at 06/22/2020 1326 Last data filed at 06/21/2020 1725 Gross per 24 hour  Intake 1055.74 ml  Output --  Net 1055.74 ml   LBM: Last BM Date: 06/21/20 Baseline Weight: Weight: 122.9 kg Most recent weight: Weight: 122.9 kg       Palliative Assessment/Data: 20%      Patient Active Problem List   Diagnosis Date Noted  . Nausea vomiting and diarrhea   . Leukocytosis   . Colorectal cancer ([place])   . Abdominal pain 06/15/2020  . Iron deficiency anemia due to chronic blood loss 12/10/2019  . Colon adenocarcinoma ([place]) 11/08/2019  . DNR (do not resuscitate) 11/08/2019  . Chronic diastolic CHF (congestive heart failure) ([place]) 11/08/2019  . Rheumatoid arthritis ([place]) 11/08/2019  . Chronic respiratory failure with hypoxia ([place]) 11/08/2019  . Palliative care encounter   . Mass of colon 11/04/2019  . Schizo affective schizophrenia ([place])   . 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 ([place]) 10/30/2019  . Primary adrenocortical insufficiency ([place]) 10/30/2019  . Anemia 10/30/2019  . Gastrointestinal hemorrhage   .  Chronic obstructive pulmonary disease ([place])   . COVID-19 virus infection 09/11/2019  . Normocytic anemia 08/25/2019  . Community acquired pneumonia   . DNR (do not resuscitate) discussion   . SOB (shortness of breath) 07/16/2019  . Obesity, Class III, BMI 40-49.9 (morbid obesity) ([place])   . Schizophrenia ([place])   . Palliative care by specialist   . Advance care planning   . Goals of care, counseling/discussion   . CHF (congestive heart failure) ([place])   . Altered mental status   . Septic shock ([place]) 08/02/2017  . Lower urinary tract infectious disease   . Pneumonia 06/21/2017  . Thrombocytopenia ([place]) 06/21/2017  . Leukopenia 06/21/2017  . Pressure injury of skin 06/21/2017    Palliative Care Assessment & Plan    Recommendations/Plan:  Meeting with [name] tomorrow morning.     Code Status:    Code Status Orders  (From admission, onward)         Start     Ordered   06/16/20 1250  Do not attempt resuscitation (DNR)  Continuous       Question Answer Comment  In the event of cardiac or respiratory ARREST Do not call a "code blue"   In the event of cardiac or respiratory ARREST Do not perform Intubation, CPR, defibrillation or ACLS   In the event of cardiac or respiratory ARREST Use medication by any route, position, wound care, and other measures to relive pain and suffering. May use oxygen, suction and manual treatment of airway obstruction as needed for comfort.   Comments d/w niece [name] and MOST form in [place]      06/16/20 1250        Code Status History  Date Active Date Inactive Code Status Order ID Comments User Context   06/15/2020 1217 06/16/2020 1250 Partial Code [id number]  Christel Mormon, MD Inpatient   06/15/2020 0452 06/15/2020 1217 DNR [id number]  Christel Mormon, MD ED   11/10/2019 2015 11/20/2019 1823 DNR [id number]  Kathie Dike, MD Inpatient   10/30/2019 1525 11/07/2019 1004 DNR [id number]  Fritzi Mandes, MD ED   09/11/2019 1024 09/16/2019 2020 DNR [id number]   Thurnell Lose, MD Inpatient   07/25/2019 1636 07/28/2019 2043 DNR [id number]  Hillary Bow, MD Inpatient   07/16/2019 1733 07/25/2019 1636 Full Code [id number]  Dustin Flock, MD ED   12/25/2017 2105 12/30/2017 1646 Full Code [id number]  Vaughan Basta, MD Inpatient   08/02/2017 1447 08/13/2017 1844 Full Code [id number]  Bettey Costa, MD Inpatient   06/21/2017 1527 07/02/2017 1628 Full Code [id number]  Theodoro Grist, MD Inpatient   Advance Care Planning Activity       Prognosis:   < 2 weeks Very poor PO intake, has had IV fluids. Coloretal CA and patient is not a candidate for surgery.    Care plan was discussed with MD  Thank you for allowing the Palliative Medicine Team to assist in the care of this patient.   Total Time 35 min Prolonged Time Billed no       Greater than 50%  of this time was spent counseling and coordinating care related to the above assessment and plan.  Asencion Gowda, NP  Please contact Palliative Medicine Team phone at [phone] for questions and concerns.

## 2020-06-22 NOTE — Progress Notes (Signed)
PROGRESS NOTE    Jerry [name]  JSE:[id number] DOB: Jun 28, 1956 DOA: 06/15/2020 PCP: [name], MD   Brief Narrative:  Jerry [name]  is a 64 y.o. AA male with a known history of BPH, CHF, COPD, hypertension, colorectal carcinoma and rheumatoid arthritis, presented to the emergency room with acute onset of nausea without significant vomiting as well as diarrhea and abdominal pain.  Symptoms have been going on over the last couple of days at [facility] where he resides.  He has been having occasional cough without wheezing or dyspnea chest pain or palpitations.  No bilious vomitus or hematemesis.  No dysuria, oliguria or hematuria or flank pain.  CT abdomen with increasing mass involving the ascending colon.  Not a surgical candidate.  Palliative care was consulted.  Patient has a legal guardian.  Subjective: Pt continued to be confused.  Nursing reported pt eating small amount of solid foods.  Having black liquid stool.  Finally able to reach guardian for discussion of hospice.   Assessment & Plan:   Active Problems:   Lower urinary tract infectious disease   Abdominal pain   Nausea vomiting and diarrhea   Leukocytosis   Colorectal cancer (HCC)  N/V and Abdominal pain  secondary to obstruction from enlarging ascending colon mass history of colorectal cancer.   Surgery was consulted and he is not a surgical candidate.  There was some initial concern of sepsis due to hypotension, tachycardia and leukocytosis.  Markedly elevated procalcitonin at 132.  Which started improving.  Leukocytosis improving.  Blood cultures remain negative, urine culture with more than 60,000 colonies of E. coli.  He was started on meropenem and vancomycin (d/c'ed) PLAN: -continue meropenem, day 7 of 7, for possible intra-abdominal infection.  Black liquid stool --liquid stool leaking out due to colonic obstruction.  Likely had bleeding from the colon mass. --Insert rectal tube  UTI.   Urine culture  with more than 60,000 colonies of E. coli.   --continue abx as above, considered treated.  AKI.  Resolved.   -reduced MIVF to 50 ml/hr.    Hypoglycemia 2/2 poor PO intake --BG in 50's.  Not a candidate for tube feed due to distal obstruction. --continue MIVF with D5 1/2NS since pt is not taking in enough oral intake. --BG q6h and hypoglycemic protocol  Hypokalemia.  Resolved.  Hypertension.   Blood pressure within goal. -continue home metop, lasix and hydralazine  Hypothyroidism.   -T4 wnl -Continue Synthroid  Rheumatoid arthritis. -Continue home dose of Plaquenil.   Objective: Vitals:   06/22/20 0802 06/22/20 1114 06/22/20 1116 06/22/20 1558  BP: (!) 145/89 (!) 139/109 (!) 136/66 (!) 137/101  Pulse: 91 85 68 97  Resp: 16 18 14 18   Temp: 97.6 F (36.4 C) 98.8 F (37.1 C) 99.1 F (37.3 C) 97.9 F (36.6 C)  TempSrc: Oral  Oral Oral  SpO2: 99% 99% 95% 100%  Weight:      Height:        Intake/Output Summary (Last 24 hours) at 06/22/2020 1642 Last data filed at 06/22/2020 1336 Gross per 24 hour  Intake 1055.74 ml  Output 850 ml  Net 205.74 ml   Filed Weights   06/15/20 0507  Weight: 122.9 kg    Examination:  Constitutional: NAD, arousable, talking to himself, but will engage other people in conversation. Confused. HEENT: conjunctivae and lids normal, EOMI CV: No cyanosis.   RESP: normal respiratory effort, on 3L SKIN: warm, dry  Neuro: II - XII grossly intact.  DVT prophylaxis: Lovenox Code Status: DNR Family Communication: finally able to reach [name] Mcmurtrey-Ray, pt's guardian on the phone today.  Discussed poor prognosis.  Guardian agreed to proceed with comfort care and hospice, but will make that transition tomorrow after she comes to see the pt.  Disposition Plan:  Status is: Inpatient  Remains inpatient appropriate because:Inpatient level of care appropriate due to severity of illness   Dispo: The patient is from: SNF              Anticipated  d/c is to: likely back to SNF as hospice pt tomorrow              Anticipated d/c date is: tomorrow              Patient currently is not medically stable to d/c.  Will wait for guardian [name] to see pt tomorrow and likely will make pt comfort care and discharge back to SNF with hospice.   Consultants:   Surgery  Palliative care  Procedures:  Antimicrobials:  Meropenem  Data Reviewed: I have personally reviewed following labs and imaging studies  CBC: Recent Labs  Lab 06/18/20 0606 06/19/20 0347 06/20/20 0532 06/21/20 0630 06/22/20 0412  WBC 10.6* 8.1 6.4 7.3 6.7  HGB 9.2* 9.7* 9.4* 9.4* 9.9*  HCT 28.5* 30.9* 28.6* 30.0* 31.8*  MCV 82.4 82.4 80.6 83.3 82.6  PLT 141* 166 178 200 161   Basic Metabolic Panel: Recent Labs  Lab 06/18/20 0606 06/19/20 0347 06/20/20 0532 06/21/20 0630 06/22/20 0412  NA 146* 141 138 140 138  K 3.5 3.4* 4.2 4.1 4.0  CL 108 107 106 107 103  CO2 26 30 26 29 29   GLUCOSE 66* 100* 88 86 88  BUN 10 8 6* <5* <5*  CREATININE 0.45* 0.38* 0.41* 0.35* 0.34*  CALCIUM 8.3* 8.2* 7.9* 7.5* 8.1*  MG  --  1.7 1.6* 1.9 1.8   GFR: Estimated Creatinine Clearance: 130 mL/min (A) (by C-G formula based on SCr of 0.34 mg/dL (L)). Liver Function Tests: No results for input(s): AST, ALT, ALKPHOS, BILITOT, PROT, ALBUMIN in the last 168 hours. No results for input(s): LIPASE, AMYLASE in the last 168 hours. No results for input(s): AMMONIA in the last 168 hours. Coagulation Profile: No results for input(s): INR, PROTIME in the last 168 hours. Cardiac Enzymes: No results for input(s): CKTOTAL, CKMB, CKMBINDEX, TROPONINI in the last 168 hours. BNP (last 3 results) No results for input(s): PROBNP in the last 8760 hours. HbA1C: No results for input(s): HGBA1C in the last 72 hours. CBG: Recent Labs  Lab 06/20/20 1919 06/20/20 2329 06/22/20 0105 06/22/20 0137 06/22/20 0507  GLUCAP 80 75 66* 70 86   Lipid Profile: No results for input(s): CHOL, HDL,  LDLCALC, TRIG, CHOLHDL, LDLDIRECT in the last 72 hours. Thyroid Function Tests: No results for input(s): TSH, T4TOTAL, FREET4, T3FREE, THYROIDAB in the last 72 hours. Anemia Panel: No results for input(s): VITAMINB12, FOLATE, FERRITIN, TIBC, IRON, RETICCTPCT in the last 72 hours. Sepsis Labs: Recent Labs  Lab 06/16/20 1837 06/17/20 0407 06/18/20 0606  PROCALCITON 28.46 22.24 7.57    Recent Results (from the past 240 hour(s))  Culture, blood (routine x 2)     Status: Abnormal   Collection Time: 06/15/20 12:44 AM   Specimen: BLOOD LEFT WRIST  Result Value Ref Range Status   Specimen Description   Final    BLOOD LEFT WRIST Performed at [facility], [address] [place], [place] [address]  Special Requests   Final    BOTTLES DRAWN AEROBIC AND ANAEROBIC Blood Culture adequate volume Performed at [facility], [address] [place], [place] [address]    Culture  Setup Time   Final    GRAM NEGATIVE RODS ANAEROBIC BOTTLE ONLY CRITICAL RESULT CALLED TO, READ BACK BY AND VERIFIED WITH: JASON ROBBINS AT 2113 ON 06/17/20 RWW Performed at [facility], [place] [address], [place], [place] [address]    Culture PROVIDENCIA STUARTII (A)  Final   Report Status 06/20/2020 FINAL  Final   Organism ID, Bacteria PROVIDENCIA STUARTII  Final      Susceptibility   Providencia stuartii - MIC*    AMPICILLIN >=32 RESISTANT Resistant     CEFAZOLIN >=64 RESISTANT Resistant     CEFEPIME 0.25 SENSITIVE Sensitive     CEFTAZIDIME <=1 SENSITIVE Sensitive     CEFTRIAXONE 0.5 SENSITIVE Sensitive     CIPROFLOXACIN 2 INTERMEDIATE Intermediate     GENTAMICIN RESISTANT Resistant     IMIPENEM 2 SENSITIVE Sensitive     TRIMETH/SULFA >=320 RESISTANT Resistant     AMPICILLIN/SULBACTAM >=32 RESISTANT Resistant     PIP/TAZO 8 SENSITIVE Sensitive     * PROVIDENCIA STUARTII  Blood Culture ID Panel (Reflexed)     Status: None   Collection Time: 06/15/20 12:44 AM  Result Value Ref Range  Status   Enterococcus species NOT DETECTED NOT DETECTED Final   Listeria monocytogenes NOT DETECTED NOT DETECTED Final   Staphylococcus species NOT DETECTED NOT DETECTED Final   Staphylococcus aureus (BCID) NOT DETECTED NOT DETECTED Final   Streptococcus species NOT DETECTED NOT DETECTED Final   Streptococcus agalactiae NOT DETECTED NOT DETECTED Final   Streptococcus pneumoniae NOT DETECTED NOT DETECTED Final   Streptococcus pyogenes NOT DETECTED NOT DETECTED Final   Acinetobacter baumannii NOT DETECTED NOT DETECTED Final   Enterobacteriaceae species NOT DETECTED NOT DETECTED Final   Enterobacter cloacae complex NOT DETECTED NOT DETECTED Final   Escherichia coli NOT DETECTED NOT DETECTED Final   Klebsiella oxytoca NOT DETECTED NOT DETECTED Final   Klebsiella pneumoniae NOT DETECTED NOT DETECTED Final   Proteus species NOT DETECTED NOT DETECTED Final   Serratia marcescens NOT DETECTED NOT DETECTED Final   Haemophilus influenzae NOT DETECTED NOT DETECTED Final   Neisseria meningitidis NOT DETECTED NOT DETECTED Final   Pseudomonas aeruginosa NOT DETECTED NOT DETECTED Final   Candida albicans NOT DETECTED NOT DETECTED Final   Candida glabrata NOT DETECTED NOT DETECTED Final   Candida krusei NOT DETECTED NOT DETECTED Final   Candida parapsilosis NOT DETECTED NOT DETECTED Final   Candida tropicalis NOT DETECTED NOT DETECTED Final    Comment: Performed at [facility], [address], [place], [place] [address]  Urine culture     Status: Abnormal   Collection Time: 06/15/20 12:48 AM   Specimen: Urine, Clean Catch  Result Value Ref Range Status   Specimen Description   Final    URINE, CLEAN CATCH Performed at [facility], [address] [place], [place] [address]    Special Requests   Final    NONE Performed at [facility], [address] [place], [place] [address]    Culture (A)  Final    60,000 COLONIES/mL ESCHERICHIA COLI Confirmed Extended  Spectrum Beta-Lactamase Producer (ESBL).  In bloodstream infections from ESBL organisms, carbapenems are preferred over piperacillin/tazobactam. They are shown to have a lower risk of mortality.    Report Status 06/18/2020 FINAL  Final  Organism ID, Bacteria ESCHERICHIA COLI (A)  Final      Susceptibility   Escherichia coli - MIC*    AMPICILLIN >=32 RESISTANT Resistant     CEFAZOLIN >=64 RESISTANT Resistant     CEFTRIAXONE >=64 RESISTANT Resistant     CIPROFLOXACIN >=4 RESISTANT Resistant     GENTAMICIN <=1 SENSITIVE Sensitive     IMIPENEM <=0.25 SENSITIVE Sensitive     NITROFURANTOIN <=16 SENSITIVE Sensitive     TRIMETH/SULFA <=20 SENSITIVE Sensitive     AMPICILLIN/SULBACTAM 4 SENSITIVE Sensitive     PIP/TAZO <=4 SENSITIVE Sensitive     * 60,000 COLONIES/mL ESCHERICHIA COLI  Culture, blood (routine x 2)     Status: None   Collection Time: 06/15/20 12:48 AM   Specimen: BLOOD RIGHT FOREARM  Result Value Ref Range Status   Specimen Description BLOOD RIGHT FOREARM  Final   Special Requests   Final    BOTTLES DRAWN AEROBIC AND ANAEROBIC Blood Culture adequate volume   Culture   Final    NO GROWTH 5 DAYS Performed at [facility], [address], [place], [place] [address]    Report Status 06/20/2020 FINAL  Final  SARS Coronavirus 2 by RT PCR (hospital order, performed in [facility] hospital lab) Nasopharyngeal Nasopharyngeal Swab     Status: None   Collection Time: 06/15/20  2:05 AM   Specimen: Nasopharyngeal Swab  Result Value Ref Range Status   SARS Coronavirus 2 NEGATIVE NEGATIVE Final    Comment: (NOTE) SARS-CoV-2 target nucleic acids are NOT DETECTED.  The SARS-CoV-2 RNA is generally detectable in upper and lower respiratory specimens during the acute phase of infection. The lowest concentration of SARS-CoV-2 viral copies this assay can detect is 250 copies / mL. A negative result does not preclude SARS-CoV-2 infection and should not be used as the sole basis for  treatment or other patient management decisions.  A negative result may occur with improper specimen collection / handling, submission of specimen other than nasopharyngeal swab, presence of viral mutation(s) within the areas targeted by this assay, and inadequate number of viral copies (<250 copies / mL). A negative result must be combined with clinical observations, patient history, and epidemiological information.  Fact Sheet for Patients:   StrictlyIdeas.no  Fact Sheet for Healthcare Providers: BankingDealers.co.za  This test is not yet approved or  cleared by the [place] FDA and has been authorized for detection and/or diagnosis of SARS-CoV-2 by FDA under an Emergency Use Authorization (EUA).  This EUA will remain in effect (meaning this test can be used) for the duration of the COVID-19 declaration under Section 564(b)(1) of the Act, 21 U.S.C. section 360bbb-3(b)(1), unless the authorization is terminated or revoked sooner.  Performed at [facility], [address] [place], [place] [address]   MRSA PCR Screening     Status: Abnormal   Collection Time: 06/21/20 10:46 AM   Specimen: Nasopharyngeal  Result Value Ref Range Status   MRSA by PCR POSITIVE (A) NEGATIVE Final    Comment:        The GeneXpert MRSA Assay (FDA approved for NASAL specimens only), is one component of a comprehensive MRSA colonization surveillance program. It is not intended to diagnose MRSA infection nor to guide or monitor treatment for MRSA infections. RESULT CALLED TO, READ BACK BY AND VERIFIED WITH: Di Kindle [facility] 06/21/20 AT 1813 BY ACR Performed at [facility], [address], [place], [place] [address]      Radiology Studies: No results found.  Scheduled Meds: . benztropine  0.5 mg Oral BID  . Chlorhexidine Gluconate Cloth  6 each Topical Daily  . docusate sodium  100 mg Oral BID  . enoxaparin (LOVENOX)  injection  40 mg Subcutaneous Q24H  . feeding supplement (PRO-STAT SUGAR FREE 64)  30 mL Oral TID WC  . ferrous sulfate  325 mg Oral BID WC  . fluPHENAZine  15 mg Oral BID  . furosemide  20 mg Oral BID  . hydrALAZINE  50 mg Oral QID  . hydrocortisone  10 mg Oral BID  . hydroxychloroquine  200 mg Oral BID  . levothyroxine  200 mcg Oral QAC breakfast  . metoprolol tartrate  75 mg Oral BID  . multivitamin with minerals  1 tablet Oral Daily  . mupirocin ointment   Nasal BID  . pantoprazole  40 mg Oral BH-q7a  . polyethylene glycol  17 g Oral Daily  . potassium chloride  10 mEq Oral Daily  . tamsulosin  0.4 mg Oral QPC breakfast   Continuous Infusions: . dextrose 5 % and 0.45 % NaCl with KCl 40 mEq/L 50 mL/hr at 06/22/20 0946     LOS: 7 days    Enzo Bi, MD [facility]  If 7PM-7AM, please contact night-coverage [email or web address]  06/22/2020, 4:42 PM

## 2020-06-23 DIAGNOSIS — Z515 Encounter for palliative care: Secondary | ICD-10-CM | POA: Diagnosis not present

## 2020-06-23 DIAGNOSIS — R109 Unspecified abdominal pain: Secondary | ICD-10-CM | POA: Diagnosis not present

## 2020-06-23 DIAGNOSIS — N390 Urinary tract infection, site not specified: Secondary | ICD-10-CM | POA: Diagnosis not present

## 2020-06-23 LAB — BASIC METABOLIC PANEL
Anion gap: 7 (ref 5–15)
BUN: <5 mg/dL — ABNORMAL LOW (ref 8–23)
CO2: 29 mmol/L (ref 22–32)
Calcium: 8 mg/dL — ABNORMAL LOW (ref 8.9–10.3)
Chloride: 101 mmol/L (ref 98–111)
Creatinine, Ser: 0.41 mg/dL — ABNORMAL LOW (ref 0.61–1.24)
GFR calc Af Amer: >60 mL/min (ref >60)
GFR calc non Af Amer: >60 mL/min (ref >60)
Glucose, Bld: 80 mg/dL (ref 70–99)
Potassium: 4.1 mmol/L (ref 3.5–5.1)
Sodium: 137 mmol/L (ref 135–145)

## 2020-06-23 LAB — GLUCOSE, CAPILLARY
Glucose-Capillary: 72 mg/dL (ref 70–99)
Glucose-Capillary: 74 mg/dL (ref 70–99)
Glucose-Capillary: 75 mg/dL (ref 70–99)
Glucose-Capillary: 87 mg/dL (ref 70–99)
Glucose-Capillary: 93 mg/dL (ref 70–99)

## 2020-06-23 LAB — CBC
HCT: 29.1 % — ABNORMAL LOW (ref 39.0–52.0)
Hemoglobin: 9.3 g/dL — ABNORMAL LOW (ref 13.0–17.0)
MCH: 26.3 pg (ref 26.0–34.0)
MCHC: 32 g/dL (ref 30.0–36.0)
MCV: 82.2 fL (ref 80.0–100.0)
Platelets: 274 10*3/uL (ref 150–400)
RBC: 3.54 MIL/uL — ABNORMAL LOW (ref 4.22–5.81)
RDW: 16.6 % — ABNORMAL HIGH (ref 11.5–15.5)
WBC: 7.6 10*3/uL (ref 4.0–10.5)
nRBC: 0 % (ref 0.0–0.2)

## 2020-06-23 LAB — MAGNESIUM: Magnesium: 1.8 mg/dL (ref 1.7–2.4)

## 2020-06-23 NOTE — Progress Notes (Signed)
Daily Progress Note   Patient Name: Jerry [name]       Date: 06/23/2020 DOB: [birth year]  Age: 64 y.o. MRN#: [id number] Attending Physician: Enzo Bi, MD Primary Care Physician: [name], MD Admit Date: 06/15/2020  Reason for Consultation/Follow-up: Establishing goals of care  Subjective: Patient is yelling out and talking to people in the room while the room has nobody else present. Jerry [name] is not present at bedside at this time. His meal tray is at bedside, and a few bites of the food appears cut up, but nothing appears eaten. He states he is not hungry and does not want anything. He has maintained a poor appetite.    Patient is hospice appropriate if HPOA chooses a comfort route. < 2 week prognosis without IVF.       Length of Stay: 8  Current Medications: Scheduled Meds:  . benztropine  0.5 mg Oral BID  . Chlorhexidine Gluconate Cloth  6 each Topical Daily  . docusate sodium  100 mg Oral BID  . enoxaparin (LOVENOX) injection  40 mg Subcutaneous Q24H  . feeding supplement (PRO-STAT SUGAR FREE 64)  30 mL Oral TID WC  . ferrous sulfate  325 mg Oral BID WC  . fluPHENAZine  15 mg Oral BID  . furosemide  20 mg Oral BID  . hydrALAZINE  50 mg Oral QID  . hydrocortisone  10 mg Oral BID  . hydroxychloroquine  200 mg Oral BID  . levothyroxine  200 mcg Oral QAC breakfast  . metoprolol tartrate  75 mg Oral BID  . multivitamin with minerals  1 tablet Oral Daily  . mupirocin ointment   Nasal BID  . pantoprazole  40 mg Oral BH-q7a  . polyethylene glycol  17 g Oral Daily  . potassium chloride  10 mEq Oral Daily  . tamsulosin  0.4 mg Oral QPC breakfast    Continuous Infusions: . dextrose 5 % and 0.45 % NaCl with KCl 40 mEq/L 50 mL/hr at 06/22/20 0946    PRN Meds: acetaminophen **OR**  acetaminophen, albuterol, LORazepam, magnesium hydroxide, morphine injection, ondansetron **OR** ondansetron (ZOFRAN) IV, promethazine, traMADol, traZODone  Physical Exam Pulmonary:     Effort: Pulmonary effort is normal.  Neurological:     Mental Status: He is alert.  Vital Signs: BP (!) 133/87 (BP Location: Left Arm)   Pulse 95   Temp 97.8 F (36.6 C)   Resp 18   Ht 6\' 2"  (1.88 m)   Wt 122.9 kg   SpO2 100%   BMI 34.79 kg/m  SpO2: SpO2: 100 % O2 Device: O2 Device: Nasal Cannula O2 Flow Rate: O2 Flow Rate (L/min): 2 L/min  Intake/output summary:   Intake/Output Summary (Last 24 hours) at 06/23/2020 1006 Last data filed at 06/22/2020 1336 Gross per 24 hour  Intake --  Output 850 ml  Net -850 ml   LBM: Last BM Date: 06/22/20 Baseline Weight: Weight: 122.9 kg Most recent weight: Weight: 122.9 kg       Palliative Assessment/Data: 20%      Patient Active Problem List   Diagnosis Date Noted  . Nausea vomiting and diarrhea   . Leukocytosis   . Colorectal cancer ([place])   . Abdominal pain 06/15/2020  . Iron deficiency anemia due to chronic blood loss 12/10/2019  . Colon adenocarcinoma ([place]) 11/08/2019  . DNR (do not resuscitate) 11/08/2019  . Chronic diastolic CHF (congestive heart failure) ([place]) 11/08/2019  . Rheumatoid arthritis ([place]) 11/08/2019  . Chronic respiratory failure with hypoxia ([place]) 11/08/2019  . Palliative care encounter   . Mass of colon 11/04/2019  . Schizo affective schizophrenia ([place])   . 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 ([place]) 10/30/2019  . Primary adrenocortical insufficiency ([place]) 10/30/2019  . Anemia 10/30/2019  . Gastrointestinal hemorrhage   . Chronic obstructive pulmonary disease ([place])   . COVID-19 virus infection 09/11/2019  . Normocytic anemia 08/25/2019  . Community acquired pneumonia   . DNR (do not resuscitate) discussion   . SOB (shortness of breath) 07/16/2019  . Obesity, Class III, BMI 40-49.9 (morbid  obesity) ([place])   . Schizophrenia ([place])   . Palliative care by specialist   . Advance care planning   . Goals of care, counseling/discussion   . CHF (congestive heart failure) ([place])   . Altered mental status   . Septic shock ([place]) 08/02/2017  . Lower urinary tract infectious disease   . Pneumonia 06/21/2017  . Thrombocytopenia ([place]) 06/21/2017  . Leukopenia 06/21/2017  . Pressure injury of skin 06/21/2017    Palliative Care Assessment & Plan    Recommendations/Plan:  Plans made yesterday to meet with Jerry [name] this morning. Jerry [name] is not here as of this time.   Patient has a very poor appetite and has maintenance IVF in place.   He is appropriate for hospice with a < 2 week prognosis if HPOA chooses.     Code Status:    Code Status Orders  (From admission, onward)         Start     Ordered   06/16/20 1250  Do not attempt resuscitation (DNR)  Continuous       Question Answer Comment  In the event of cardiac or respiratory ARREST Do not call a "code blue"   In the event of cardiac or respiratory ARREST Do not perform Intubation, CPR, defibrillation or ACLS   In the event of cardiac or respiratory ARREST Use medication by any route, position, wound care, and other measures to relive pain and suffering. May use oxygen, suction and manual treatment of airway obstruction as needed for comfort.   Comments d/w niece Jerry [name] and MOST form in Jerry [name]      06/16/20 1250        Code Status History    Date Active Date Inactive  Code Status Order ID Comments User Context   06/15/2020 1217 06/16/2020 1250 Partial Code [id number]  Sidney Ace Arvella Merles, MD Inpatient   06/15/2020 0452 06/15/2020 1217 DNR [id number]  Christel Mormon, MD ED   11/10/2019 2015 11/20/2019 1823 DNR [id number]  Kathie Dike, MD Inpatient   10/30/2019 1525 11/07/2019 1004 DNR [id number]  Fritzi Mandes, MD ED   09/11/2019 1024 09/16/2019 2020 DNR [id number]  Thurnell Lose, MD Inpatient   07/25/2019 1636 07/28/2019 2043 DNR [id number]   Hillary Bow, MD Inpatient   07/16/2019 1733 07/25/2019 1636 Full Code [id number]  Dustin Flock, MD ED   12/25/2017 2105 12/30/2017 1646 Full Code [id number]  Vaughan Basta, MD Inpatient   08/02/2017 1447 08/13/2017 1844 Full Code [id number]  Bettey Costa, MD Inpatient   06/21/2017 1527 07/02/2017 1628 Full Code [id number]  Theodoro Grist, MD Inpatient   Advance Care Planning Activity       Prognosis:   < 2 weeks Very poor PO intake, has had IV fluids. Coloretal CA and patient is not a candidate for surgery.    Care plan was discussed with MD  Thank you for allowing the Palliative Medicine Team to assist in the care of this patient.   Total Time 15 min Prolonged Time Billed no       Greater than 50%  of this time was spent counseling and coordinating care related to the above assessment and plan.  Asencion Gowda, NP  Please contact Palliative Medicine Team phone at [phone] for questions and concerns.

## 2020-06-23 NOTE — Plan of Care (Signed)
PMT note:  Spoke with [name]. She is on her way to the area. Plans to meet tomorrow morning at 9:30 at bedside.

## 2020-06-23 NOTE — Progress Notes (Signed)
PROGRESS NOTE    Jerry [name]  ZDG:[id number] DOB: Jan 13, 1956 DOA: 06/15/2020 PCP: [name], MD   Brief Narrative:  Jerry [name]  is a 64 y.o. AA male with a known history of BPH, CHF, COPD, hypertension, colorectal carcinoma and rheumatoid arthritis, presented to the emergency room with acute onset of nausea without significant vomiting as well as diarrhea and abdominal pain.  Symptoms have been going on over the last couple of days at [facility] where he resides.  He has been having occasional cough without wheezing or dyspnea chest pain or palpitations.  No bilious vomitus or hematemesis.  No dysuria, oliguria or hematuria or flank pain.  CT abdomen with increasing mass involving the ascending colon.  Not a surgical candidate.  Palliative care was consulted.  Patient has a legal guardian.  Subjective: Pt had been fixated on wanting to remove something from his left pant pocket.  Eating some solid foods but very little.  Noted to have black vomit and black liquid stool.   Assessment & Plan:   Active Problems:   Lower urinary tract infectious disease   Abdominal pain   Nausea vomiting and diarrhea   Leukocytosis   Colorectal cancer (HCC)  N/V and Abdominal pain  secondary to obstruction from enlarging ascending colon mass history of colorectal cancer.   Surgery was consulted and he is not a surgical candidate.  There was some initial concern of sepsis due to hypotension, tachycardia and leukocytosis.  Markedly elevated procalcitonin at 132.  Which started improving.  Leukocytosis improving.  Blood cultures remain negative, urine culture with more than 60,000 colonies of E. coli.  He was started on meropenem and vancomycin (d/c'ed).  Completed 7 days of meropenem for possible intra-abdominal infection.  Black liquid stool --liquid stool leaking out due to colonic obstruction.  Likely had bleeding from the colon mass. --continue rectal tube  UTI.   Urine culture with more  than 60,000 colonies of E. coli.   --Completed 7 days of meropenem  AKI.  Resolved.   -continue MIVF to 50 ml/hr.    Hypoglycemia 2/2 poor PO intake --BG in 50's.  Not a candidate for tube feed due to distal obstruction. --continue MIVF with D5 1/2NS since pt is not taking in enough oral intake. --BG q6h and hypoglycemic protocol  Hypokalemia.  Resolved.  Hypertension.   Blood pressure within goal. -continue home metop, lasix and hydralazine  Hypothyroidism.   -T4 wnl -Continue Synthroid  Rheumatoid arthritis. -Continue home dose of Plaquenil.   Objective: Vitals:   06/23/20 0353 06/23/20 0747 06/23/20 1115 06/23/20 1647  BP: (!) 134/87 (!) 133/87 (!) 144/97 (!) 129/79  Pulse: 91 95 (!) 110 101  Resp: 18 18 18 17   Temp: 98.4 F (36.9 C) 97.8 F (36.6 C) 98.2 F (36.8 C) 98.5 F (36.9 C)  TempSrc: Oral     SpO2: 100% 100% 100% 100%  Weight:      Height:       No intake or output data in the 24 hours ending 06/23/20 1935 Filed Weights   06/15/20 0507  Weight: 122.9 kg    Examination:  Constitutional: NAD, arousable, talking to himself, but will engage other people in conversation. Confused. HEENT: conjunctivae and lids normal, EOMI CV: No cyanosis.   RESP: normal respiratory effort, on 3L SKIN: warm, dry  Neuro: II - XII grossly intact.     DVT prophylaxis: Lovenox Code Status: DNR Family Communication: finally able to reach [name] Hoopingarner-Ray, pt's guardian on the  phone on 7/27.  Discussed poor prognosis.  Guardian agreed to proceed with comfort care and hospice, arrived too late in the day.  Will meet with palliative tomorrow morning.  Disposition Plan:  Status is: Inpatient  Remains inpatient appropriate because:Inpatient level of care appropriate due to severity of illness   Dispo: The patient is from: SNF              Anticipated d/c is to: hospice facility              Anticipated d/c date is: tomorrow              Patient currently is not  medically stable to d/c.  Will wait for guardian [name] to see pt tomorrow and likely will make pt comfort care and discharge pt to hospice facility.   Consultants:   Surgery  Palliative care  Procedures:  Antimicrobials:  Meropenem  Data Reviewed: I have personally reviewed following labs and imaging studies  CBC: Recent Labs  Lab 06/19/20 0347 06/20/20 0532 06/21/20 0630 06/22/20 0412 06/23/20 0630  WBC 8.1 6.4 7.3 6.7 7.6  HGB 9.7* 9.4* 9.4* 9.9* 9.3*  HCT 30.9* 28.6* 30.0* 31.8* 29.1*  MCV 82.4 80.6 83.3 82.6 82.2  PLT 166 178 200 242 209   Basic Metabolic Panel: Recent Labs  Lab 06/19/20 0347 06/20/20 0532 06/21/20 0630 06/22/20 0412 06/23/20 0630  NA 141 138 140 138 137  K 3.4* 4.2 4.1 4.0 4.1  CL 107 106 107 103 101  CO2 30 26 29 29 29   GLUCOSE 100* 88 86 88 80  BUN 8 6* <5* <5* <5*  CREATININE 0.38* 0.41* 0.35* 0.34* 0.41*  CALCIUM 8.2* 7.9* 7.5* 8.1* 8.0*  MG 1.7 1.6* 1.9 1.8 1.8   GFR: Estimated Creatinine Clearance: 130 mL/min (A) (by C-G formula based on SCr of 0.41 mg/dL (L)). Liver Function Tests: No results for input(s): AST, ALT, ALKPHOS, BILITOT, PROT, ALBUMIN in the last 168 hours. No results for input(s): LIPASE, AMYLASE in the last 168 hours. No results for input(s): AMMONIA in the last 168 hours. Coagulation Profile: No results for input(s): INR, PROTIME in the last 168 hours. Cardiac Enzymes: No results for input(s): CKTOTAL, CKMB, CKMBINDEX, TROPONINI in the last 168 hours. BNP (last 3 results) No results for input(s): PROBNP in the last 8760 hours. HbA1C: No results for input(s): HGBA1C in the last 72 hours. CBG: Recent Labs  Lab 06/22/20 0507 06/23/20 0026 06/23/20 0508 06/23/20 1117 06/23/20 1853  GLUCAP 86 75 72 87 93   Lipid Profile: No results for input(s): CHOL, HDL, LDLCALC, TRIG, CHOLHDL, LDLDIRECT in the last 72 hours. Thyroid Function Tests: No results for input(s): TSH, T4TOTAL, FREET4, T3FREE, THYROIDAB in the  last 72 hours. Anemia Panel: No results for input(s): VITAMINB12, FOLATE, FERRITIN, TIBC, IRON, RETICCTPCT in the last 72 hours. Sepsis Labs: Recent Labs  Lab 06/17/20 0407 06/18/20 0606  PROCALCITON 22.24 7.57    Recent Results (from the past 240 hour(s))  Culture, blood (routine x 2)     Status: Abnormal   Collection Time: 06/15/20 12:44 AM   Specimen: BLOOD LEFT WRIST  Result Value Ref Range Status   Specimen Description   Final    BLOOD LEFT WRIST Performed at [facility], [address], [place], [place] [address]    Special Requests   Final    BOTTLES DRAWN AEROBIC AND ANAEROBIC Blood Culture adequate volume Performed at [facility], [address] [place], [place] [address]  Culture  Setup Time   Final    GRAM NEGATIVE RODS ANAEROBIC BOTTLE ONLY CRITICAL RESULT CALLED TO, READ BACK BY AND VERIFIED WITH: JASON ROBBINS AT 2113 ON 06/17/20 RWW Performed at [facility], [place] [address] [place], [place] [address]    Culture PROVIDENCIA STUARTII (A)  Final   Report Status 06/20/2020 FINAL  Final   Organism ID, Bacteria PROVIDENCIA STUARTII  Final      Susceptibility   Providencia stuartii - MIC*    AMPICILLIN >=32 RESISTANT Resistant     CEFAZOLIN >=64 RESISTANT Resistant     CEFEPIME 0.25 SENSITIVE Sensitive     CEFTAZIDIME <=1 SENSITIVE Sensitive     CEFTRIAXONE 0.5 SENSITIVE Sensitive     CIPROFLOXACIN 2 INTERMEDIATE Intermediate     GENTAMICIN RESISTANT Resistant     IMIPENEM 2 SENSITIVE Sensitive     TRIMETH/SULFA >=320 RESISTANT Resistant     AMPICILLIN/SULBACTAM >=32 RESISTANT Resistant     PIP/TAZO 8 SENSITIVE Sensitive     * PROVIDENCIA STUARTII  Blood Culture ID Panel (Reflexed)     Status: None   Collection Time: 06/15/20 12:44 AM  Result Value Ref Range Status   Enterococcus species NOT DETECTED NOT DETECTED Final   Listeria monocytogenes NOT DETECTED NOT DETECTED Final   Staphylococcus species NOT DETECTED NOT DETECTED  Final   Staphylococcus aureus (BCID) NOT DETECTED NOT DETECTED Final   Streptococcus species NOT DETECTED NOT DETECTED Final   Streptococcus agalactiae NOT DETECTED NOT DETECTED Final   Streptococcus pneumoniae NOT DETECTED NOT DETECTED Final   Streptococcus pyogenes NOT DETECTED NOT DETECTED Final   Acinetobacter baumannii NOT DETECTED NOT DETECTED Final   Enterobacteriaceae species NOT DETECTED NOT DETECTED Final   Enterobacter cloacae complex NOT DETECTED NOT DETECTED Final   Escherichia coli NOT DETECTED NOT DETECTED Final   Klebsiella oxytoca NOT DETECTED NOT DETECTED Final   Klebsiella pneumoniae NOT DETECTED NOT DETECTED Final   Proteus species NOT DETECTED NOT DETECTED Final   Serratia marcescens NOT DETECTED NOT DETECTED Final   Haemophilus influenzae NOT DETECTED NOT DETECTED Final   Neisseria meningitidis NOT DETECTED NOT DETECTED Final   Pseudomonas aeruginosa NOT DETECTED NOT DETECTED Final   Candida albicans NOT DETECTED NOT DETECTED Final   Candida glabrata NOT DETECTED NOT DETECTED Final   Candida krusei NOT DETECTED NOT DETECTED Final   Candida parapsilosis NOT DETECTED NOT DETECTED Final   Candida tropicalis NOT DETECTED NOT DETECTED Final    Comment: Performed at [facility], [address] [place], [place] [address]  Urine culture     Status: Abnormal   Collection Time: 06/15/20 12:48 AM   Specimen: Urine, Clean Catch  Result Value Ref Range Status   Specimen Description   Final    URINE, CLEAN CATCH Performed at [facility], [address], [place], [place] [address]    Special Requests   Final    NONE Performed at [facility], [address] [place], [place] [address]    Culture (A)  Final    60,000 COLONIES/mL ESCHERICHIA COLI Confirmed Extended Spectrum Beta-Lactamase Producer (ESBL).  In bloodstream infections from ESBL organisms, carbapenems are preferred over piperacillin/tazobactam. They are shown to have a lower risk  of mortality.    Report Status 06/18/2020 FINAL  Final   Organism ID, Bacteria ESCHERICHIA COLI (A)  Final      Susceptibility   Escherichia coli - MIC*    AMPICILLIN >=32 RESISTANT Resistant     CEFAZOLIN >=  64 RESISTANT Resistant     CEFTRIAXONE >=64 RESISTANT Resistant     CIPROFLOXACIN >=4 RESISTANT Resistant     GENTAMICIN <=1 SENSITIVE Sensitive     IMIPENEM <=0.25 SENSITIVE Sensitive     NITROFURANTOIN <=16 SENSITIVE Sensitive     TRIMETH/SULFA <=20 SENSITIVE Sensitive     AMPICILLIN/SULBACTAM 4 SENSITIVE Sensitive     PIP/TAZO <=4 SENSITIVE Sensitive     * 60,000 COLONIES/mL ESCHERICHIA COLI  Culture, blood (routine x 2)     Status: None   Collection Time: 06/15/20 12:48 AM   Specimen: BLOOD RIGHT FOREARM  Result Value Ref Range Status   Specimen Description BLOOD RIGHT FOREARM  Final   Special Requests   Final    BOTTLES DRAWN AEROBIC AND ANAEROBIC Blood Culture adequate volume   Culture   Final    NO GROWTH 5 DAYS Performed at [facility], [address], [place], [place] [address]    Report Status 06/20/2020 FINAL  Final  SARS Coronavirus 2 by RT PCR (hospital order, performed in [facility] hospital lab) Nasopharyngeal Nasopharyngeal Swab     Status: None   Collection Time: 06/15/20  2:05 AM   Specimen: Nasopharyngeal Swab  Result Value Ref Range Status   SARS Coronavirus 2 NEGATIVE NEGATIVE Final    Comment: (NOTE) SARS-CoV-2 target nucleic acids are NOT DETECTED.  The SARS-CoV-2 RNA is generally detectable in upper and lower respiratory specimens during the acute phase of infection. The lowest concentration of SARS-CoV-2 viral copies this assay can detect is 250 copies / mL. A negative result does not preclude SARS-CoV-2 infection and should not be used as the sole basis for treatment or other patient management decisions.  A negative result may occur with improper specimen collection / handling, submission of specimen other than nasopharyngeal  swab, presence of viral mutation(s) within the areas targeted by this assay, and inadequate number of viral copies (<250 copies / mL). A negative result must be combined with clinical observations, patient history, and epidemiological information.  Fact Sheet for Patients:   StrictlyIdeas.no  Fact Sheet for Healthcare Providers: BankingDealers.co.za  This test is not yet approved or  cleared by the [place] FDA and has been authorized for detection and/or diagnosis of SARS-CoV-2 by FDA under an Emergency Use Authorization (EUA).  This EUA will remain in effect (meaning this test can be used) for the duration of the COVID-19 declaration under Section 564(b)(1) of the Act, 21 U.S.C. section 360bbb-3(b)(1), unless the authorization is terminated or revoked sooner.  Performed at [facility], [address] [place], [place] [address]   MRSA PCR Screening     Status: Abnormal   Collection Time: 06/21/20 10:46 AM   Specimen: Nasopharyngeal  Result Value Ref Range Status   MRSA by PCR POSITIVE (A) NEGATIVE Final    Comment:        The GeneXpert MRSA Assay (FDA approved for NASAL specimens only), is one component of a comprehensive MRSA colonization surveillance program. It is not intended to diagnose MRSA infection nor to guide or monitor treatment for MRSA infections. RESULT CALLED TO, READ BACK BY AND VERIFIED WITH: Di Kindle [facility] 06/21/20 AT 1813 BY ACR Performed at [facility], [address] [place], [place] [address]      Radiology Studies: No results found.  Scheduled Meds: . benztropine  0.5 mg Oral BID  . Chlorhexidine Gluconate Cloth  6 each Topical Daily  . docusate sodium  100 mg Oral BID  . enoxaparin (  LOVENOX) injection  40 mg Subcutaneous Q24H  . feeding supplement (PRO-STAT SUGAR FREE 64)  30 mL Oral TID WC  . ferrous sulfate  325 mg Oral BID WC  . fluPHENAZine  15 mg Oral BID  .  furosemide  20 mg Oral BID  . hydrALAZINE  50 mg Oral QID  . hydrocortisone  10 mg Oral BID  . hydroxychloroquine  200 mg Oral BID  . levothyroxine  200 mcg Oral QAC breakfast  . metoprolol tartrate  75 mg Oral BID  . multivitamin with minerals  1 tablet Oral Daily  . mupirocin ointment   Nasal BID  . pantoprazole  40 mg Oral BH-q7a  . polyethylene glycol  17 g Oral Daily  . potassium chloride  10 mEq Oral Daily  . tamsulosin  0.4 mg Oral QPC breakfast   Continuous Infusions: . dextrose 5 % and 0.45 % NaCl with KCl 40 mEq/L 50 mL/hr at 06/22/20 0946     LOS: 8 days    Enzo Bi, MD [facility]  If 7PM-7AM, please contact night-coverage [email or web address]  06/23/2020, 7:35 PM

## 2020-06-24 DIAGNOSIS — Z515 Encounter for palliative care: Secondary | ICD-10-CM | POA: Diagnosis not present

## 2020-06-24 DIAGNOSIS — Z7189 Other specified counseling: Secondary | ICD-10-CM | POA: Diagnosis not present

## 2020-06-24 LAB — CBC
HCT: 27.6 % — ABNORMAL LOW (ref 39.0–52.0)
Hemoglobin: 9 g/dL — ABNORMAL LOW (ref 13.0–17.0)
MCH: 26.3 pg (ref 26.0–34.0)
MCHC: 32.6 g/dL (ref 30.0–36.0)
MCV: 80.7 fL (ref 80.0–100.0)
Platelets: 308 10*3/uL (ref 150–400)
RBC: 3.42 MIL/uL — ABNORMAL LOW (ref 4.22–5.81)
RDW: 17 % — ABNORMAL HIGH (ref 11.5–15.5)
WBC: 8.6 10*3/uL (ref 4.0–10.5)
nRBC: 0 % (ref 0.0–0.2)

## 2020-06-24 LAB — BASIC METABOLIC PANEL
Anion gap: 8 (ref 5–15)
BUN: 8 mg/dL (ref 8–23)
CO2: 28 mmol/L (ref 22–32)
Calcium: 7.8 mg/dL — ABNORMAL LOW (ref 8.9–10.3)
Chloride: 101 mmol/L (ref 98–111)
Creatinine, Ser: 0.42 mg/dL — ABNORMAL LOW (ref 0.61–1.24)
GFR calc Af Amer: >60 mL/min (ref >60)
GFR calc non Af Amer: >60 mL/min (ref >60)
Glucose, Bld: 96 mg/dL (ref 70–99)
Potassium: 4 mmol/L (ref 3.5–5.1)
Sodium: 137 mmol/L (ref 135–145)

## 2020-06-24 LAB — MAGNESIUM: Magnesium: 1.8 mg/dL (ref 1.7–2.4)

## 2020-06-24 LAB — GLUCOSE, CAPILLARY: Glucose-Capillary: 76 mg/dL (ref 70–99)

## 2020-06-24 MED ORDER — MORPHINE SULFATE (PF) 2 MG/ML IV SOLN
2.0000 mg | INTRAVENOUS | Status: DC | PRN
  Administered 2020-06-24 (×3): 2 mg via INTRAVENOUS
  Filled 2020-06-24 (×3): qty 1

## 2020-06-24 MED ORDER — POLYVINYL ALCOHOL 1.4 % OP SOLN
1.0000 [drp] | Freq: Four times a day (QID) | OPHTHALMIC | Status: DC | PRN
  Filled 2020-06-24: qty 15

## 2020-06-24 MED ORDER — LORAZEPAM 2 MG/ML PO CONC: 1.0000 mg | ORAL | Status: DC | PRN

## 2020-06-24 MED ORDER — HALOPERIDOL LACTATE 5 MG/ML IJ SOLN: 0.5000 mg | INTRAMUSCULAR | Status: DC | PRN

## 2020-06-24 MED ORDER — MORPHINE BOLUS VIA INFUSION
2.0000 mg | INTRAVENOUS | Status: DC | PRN
  Administered 2020-06-24 – 2020-06-26 (×25): 2 mg via INTRAVENOUS
  Filled 2020-06-24: qty 2

## 2020-06-24 MED ORDER — ONDANSETRON HCL 4 MG/2ML IJ SOLN: 4.0000 mg | Freq: Four times a day (QID) | INTRAMUSCULAR | Status: DC | PRN

## 2020-06-24 MED ORDER — DEXAMETHASONE SODIUM PHOSPHATE 4 MG/ML IJ SOLN
4.0000 mg | Freq: Four times a day (QID) | INTRAMUSCULAR | Status: DC
  Administered 2020-06-24 – 2020-06-28 (×17): 4 mg via INTRAVENOUS
  Filled 2020-06-24 (×17): qty 1

## 2020-06-24 MED ORDER — SODIUM CHLORIDE 0.9% FLUSH
3.0000 mL | INTRAVENOUS | Status: DC | PRN
  Administered 2020-06-28: 11:00:00 3 mL via INTRAVENOUS

## 2020-06-24 MED ORDER — HALOPERIDOL LACTATE 5 MG/ML IJ SOLN
0.5000 mg | INTRAMUSCULAR | Status: DC
  Administered 2020-06-24 – 2020-06-28 (×25): 0.5 mg via INTRAVENOUS
  Filled 2020-06-24 (×25): qty 1

## 2020-06-24 MED ORDER — OCTREOTIDE ACETATE 100 MCG/ML IJ SOLN
100.0000 ug | Freq: Three times a day (TID) | INTRAMUSCULAR | Status: DC
  Administered 2020-06-24 – 2020-06-28 (×13): 100 ug via INTRAVENOUS
  Filled 2020-06-24 (×16): qty 1

## 2020-06-24 MED ORDER — ONDANSETRON 4 MG PO TBDP
4.0000 mg | ORAL_TABLET | Freq: Four times a day (QID) | ORAL | Status: DC | PRN
  Filled 2020-06-24: qty 1

## 2020-06-24 MED ORDER — LORAZEPAM 2 MG/ML IJ SOLN
1.0000 mg | INTRAMUSCULAR | Status: DC | PRN
  Administered 2020-06-25 – 2020-06-28 (×4): 1 mg via INTRAVENOUS
  Filled 2020-06-24 (×4): qty 1

## 2020-06-24 MED ORDER — SODIUM CHLORIDE 0.9% FLUSH
3.0000 mL | Freq: Two times a day (BID) | INTRAVENOUS | Status: DC
  Administered 2020-06-24 – 2020-06-27 (×8): 3 mL via INTRAVENOUS

## 2020-06-24 MED ORDER — PROMETHAZINE HCL 25 MG/ML IJ SOLN
25.0000 mg | Freq: Four times a day (QID) | INTRAMUSCULAR | Status: DC | PRN
  Administered 2020-06-24: 10:00:00 25 mg via INTRAVENOUS
  Filled 2020-06-24: qty 1

## 2020-06-24 MED ORDER — LORAZEPAM 1 MG PO TABS: 1.0000 mg | ORAL_TABLET | ORAL | Status: DC | PRN

## 2020-06-24 MED ORDER — MORPHINE 100MG IN NS 100ML (1MG/ML) PREMIX INFUSION
2.0000 mg/h | INTRAVENOUS | Status: DC
  Administered 2020-06-24: 17:00:00 2.5 mg/h via INTRAVENOUS
  Administered 2020-06-24: 16:00:00 2 mg/h via INTRAVENOUS
  Administered 2020-06-25 (×2): 3 mg/h via INTRAVENOUS
  Administered 2020-06-25: 4 mg/h via INTRAVENOUS
  Administered 2020-06-25: 17:00:00 4.5 mg/h via INTRAVENOUS
  Administered 2020-06-25: 3.5 mg/h via INTRAVENOUS
  Administered 2020-06-25: 09:00:00 3 mg/h via INTRAVENOUS
  Administered 2020-06-26 – 2020-06-27 (×2): 4.5 mg/h via INTRAVENOUS
  Administered 2020-06-28: 13:00:00 2 mg/h via INTRAVENOUS
  Filled 2020-06-24 (×5): qty 100

## 2020-06-24 MED ORDER — GLYCOPYRROLATE 0.2 MG/ML IJ SOLN
0.4000 mg | INTRAMUSCULAR | Status: DC | PRN
  Administered 2020-06-24 – 2020-06-28 (×5): 0.4 mg via INTRAVENOUS
  Filled 2020-06-24 (×5): qty 2

## 2020-06-24 MED ORDER — SCOPOLAMINE 1 MG/3DAYS TD PT72
1.0000 | MEDICATED_PATCH | TRANSDERMAL | Status: DC
  Administered 2020-06-24 – 2020-06-27 (×2): 1.5 mg via TRANSDERMAL
  Filled 2020-06-24 (×2): qty 1

## 2020-06-24 MED ORDER — LORAZEPAM 2 MG/ML IJ SOLN
1.0000 mg | INTRAMUSCULAR | Status: DC | PRN
  Administered 2020-06-24: 1 mg via INTRAVENOUS
  Filled 2020-06-24: qty 1

## 2020-06-24 MED ORDER — GLYCOPYRROLATE 0.2 MG/ML IJ SOLN: 0.4000 mg | INTRAMUSCULAR | Status: DC | PRN

## 2020-06-24 MED ORDER — ONDANSETRON 4 MG PO TBDP
4.0000 mg | ORAL_TABLET | Freq: Four times a day (QID) | ORAL | Status: DC
  Filled 2020-06-24 (×20): qty 1

## 2020-06-24 MED ORDER — BIOTENE DRY MOUTH MT LIQD
15.0000 mL | OROMUCOSAL | Status: DC | PRN
  Filled 2020-06-24: qty 15

## 2020-06-24 MED ORDER — HALOPERIDOL LACTATE 2 MG/ML PO CONC
0.5000 mg | ORAL | Status: DC | PRN
  Filled 2020-06-24: qty 0.3

## 2020-06-24 MED ORDER — DEXAMETHASONE SODIUM PHOSPHATE 4 MG/ML IJ SOLN: 4.0000 mg | INTRAMUSCULAR | Status: DC

## 2020-06-24 MED ORDER — HALOPERIDOL 0.5 MG PO TABS
0.5000 mg | ORAL_TABLET | ORAL | Status: DC | PRN
  Filled 2020-06-24: qty 1

## 2020-06-24 MED ORDER — ONDANSETRON HCL 4 MG/2ML IJ SOLN
4.0000 mg | Freq: Four times a day (QID) | INTRAMUSCULAR | Status: DC
  Administered 2020-06-24 – 2020-06-28 (×17): 4 mg via INTRAVENOUS
  Filled 2020-06-24 (×17): qty 2

## 2020-06-24 NOTE — Progress Notes (Signed)
Manufacturing engineer hospital liaison note:  New referral for [facility] hospice home received from [facility]. Patient information given to referral. Hospice eligibility has been confirmed. [facility] is unable to offer a bed today.   Writer met in the room with patient's HCPOA niece [name] to initiate education regarding hospice services, philosophy, team approach to care and current visitation policy with understanding voiced. Questions answered.   Unfortunately [facility] is unable to offer a bed today.  [name] and the hospital care team is aware. Will continue to follow and update both family and hospital team regarding bed availability. Thank you for the opportunity to be involved in the care of this patient and his family. Flo Shanks BSN, RN, [place] [phone]

## 2020-06-24 NOTE — Progress Notes (Signed)
PROGRESS NOTE    Jerry [name]  YTK:[id number] DOB: Jan 21, 1956 DOA: 06/15/2020 PCP: [name], MD   Brief Narrative:  Jerry [name]  is a 64 y.o. AA male with a known history of BPH, CHF, COPD, hypertension, colorectal carcinoma and rheumatoid arthritis, presented to the emergency room with acute onset of nausea without significant vomiting as well as diarrhea and abdominal pain.  Symptoms have been going on over the last couple of days at [facility] where [facility].  He has been having occasional cough without wheezing or dyspnea chest pain or palpitations.  No bilious vomitus or hematemesis.  No dysuria, oliguria or hematuria or flank pain.  CT abdomen with increasing mass involving the ascending colon.  Not a surgical candidate.  Palliative care was consulted.  Patient has a legal guardian.  Subjective: Found to be vomiting fecal matter.  Pt made NPO.  Talked with Guardian [name] this morning and confirmed decision for comfort care.  Palliative care provider later verified and initiated comfort care orders.     Assessment & Plan:   Active Problems:   Lower urinary tract infectious disease   Abdominal pain   Nausea vomiting and diarrhea   Leukocytosis   Colorectal cancer ([place])   Comfort care status --comfort care orders entered by palliative care provider. --PCA pump started due to pt's high requirement for pain meds --Need everything in IV since pt can not take in orals --will discharge to hospice facility  N/V and Abdominal pain  secondary to obstruction from enlarging ascending colon mass history of colorectal cancer.   Surgery was consulted and he is not a surgical candidate.  There was some initial concern of sepsis due to hypotension, tachycardia and leukocytosis.  Markedly elevated procalcitonin at 132.  Which started improving.  Leukocytosis improving.  Blood cultures remain negative, urine culture with more than 60,000 colonies of E. coli.  He was started on  meropenem and vancomycin (d/c'ed).  Completed 7 days of meropenem for possible intra-abdominal infection.  Black liquid stool --liquid stool leaking out due to colonic obstruction.  Likely had bleeding from the colon mass. --continue rectal tube  UTI.   Urine culture with more than 60,000 colonies of E. coli.   --Completed 7 days of meropenem --Insert Foley for comfort  AKI.  Resolved.   -d/c MIVF due to now comfort care  Hypoglycemia 2/2 poor PO intake --BG in 50's.  Not a candidate for tube feed due to distal obstruction. --d/c MIVF now due to comfort care  Hypokalemia.  Resolved.  Hypertension.   Blood pressure within goal. -d/c home metop, lasix and hydralazine due to now comfort care  Hypothyroidism.   -T4 wnl -d/c Synthroid due to now comfort care  Rheumatoid arthritis. -d/c home Plaquenil now due to comfort care   Objective: Vitals:   06/23/20 2336 06/24/20 0308 06/24/20 0310 06/24/20 0746  BP: (!) 114/98 110/68  (!) 123/87  Pulse: 102 (!) 115 (!) 110 100  Resp: 16 20  20   Temp: 98.4 F (36.9 C) 98.6 F (37 C)  98.7 F (37.1 C)  TempSrc: Oral Oral  Oral  SpO2: 99% 100% 100% 100%  Weight:      Height:        Intake/Output Summary (Last 24 hours) at 06/24/2020 1858 Last data filed at 06/24/2020 1641 Gross per 24 hour  Intake --  Output 250 ml  Net -250 ml   Filed Weights   06/15/20 0507  Weight: 122.9 kg  Examination:  Constitutional: NAD, arousable, talking to himself, but will engage other people in conversation. Confused. HEENT: conjunctivae and lids normal, EOMI CV: No cyanosis.   RESP: normal respiratory effort, on 3L SKIN: warm, dry  Neuro: II - XII grossly intact.     DVT prophylaxis: d/c Lovenox now due to comfort care Code Status: DNR Family Communication: talked with guardian [name] and sister today at the bedside  Disposition Plan:  Status is: Inpatient  Remains inpatient appropriate because:Inpatient level of care appropriate  due to severity of illness   Dispo: The patient is from: SNF              Anticipated d/c is to: hospice facility              Anticipated d/c date is: whenever bed available at hospice facility              Patient currently is not medically stable to d/c.  Pt will need to be discharged to hospice facility due to need for IV meds for comfort care.   Consultants:   Surgery  Palliative care  Procedures:  Antimicrobials:  Meropenem  Data Reviewed: I have personally reviewed following labs and imaging studies  CBC: Recent Labs  Lab 06/20/20 0532 06/21/20 0630 06/22/20 0412 06/23/20 0630 06/24/20 0423  WBC 6.4 7.3 6.7 7.6 8.6  HGB 9.4* 9.4* 9.9* 9.3* 9.0*  HCT 28.6* 30.0* 31.8* 29.1* 27.6*  MCV 80.6 83.3 82.6 82.2 80.7  PLT 178 200 242 274 101   Basic Metabolic Panel: Recent Labs  Lab 06/20/20 0532 06/21/20 0630 06/22/20 0412 06/23/20 0630 06/24/20 0423  NA 138 140 138 137 137  K 4.2 4.1 4.0 4.1 4.0  CL 106 107 103 101 101  CO2 26 29 29 29 28   GLUCOSE 88 86 88 80 96  BUN 6* <5* <5* <5* 8  CREATININE 0.41* 0.35* 0.34* 0.41* 0.42*  CALCIUM 7.9* 7.5* 8.1* 8.0* 7.8*  MG 1.6* 1.9 1.8 1.8 1.8   GFR: Estimated Creatinine Clearance: 130 mL/min (A) (by C-G formula based on SCr of 0.42 mg/dL (L)). Liver Function Tests: No results for input(s): AST, ALT, ALKPHOS, BILITOT, PROT, ALBUMIN in the last 168 hours. No results for input(s): LIPASE, AMYLASE in the last 168 hours. No results for input(s): AMMONIA in the last 168 hours. Coagulation Profile: No results for input(s): INR, PROTIME in the last 168 hours. Cardiac Enzymes: No results for input(s): CKTOTAL, CKMB, CKMBINDEX, TROPONINI in the last 168 hours. BNP (last 3 results) No results for input(s): PROBNP in the last 8760 hours. HbA1C: No results for input(s): HGBA1C in the last 72 hours. CBG: Recent Labs  Lab 06/23/20 0508 06/23/20 1117 06/23/20 1853 06/23/20 2334 06/24/20 0533  GLUCAP 72 87 93 74 76    Lipid Profile: No results for input(s): CHOL, HDL, LDLCALC, TRIG, CHOLHDL, LDLDIRECT in the last 72 hours. Thyroid Function Tests: No results for input(s): TSH, T4TOTAL, FREET4, T3FREE, THYROIDAB in the last 72 hours. Anemia Panel: No results for input(s): VITAMINB12, FOLATE, FERRITIN, TIBC, IRON, RETICCTPCT in the last 72 hours. Sepsis Labs: Recent Labs  Lab 06/18/20 0606  PROCALCITON 7.57    Recent Results (from the past 240 hour(s))  Culture, blood (routine x 2)     Status: Abnormal   Collection Time: 06/15/20 12:44 AM   Specimen: BLOOD LEFT WRIST  Result Value Ref Range Status   Specimen Description   Final    BLOOD LEFT WRIST Performed at [facility],  [place], [place] [address]    Special Requests   Final    BOTTLES DRAWN AEROBIC AND ANAEROBIC Blood Culture adequate volume Performed at [facility], [address] [place], [place] [address]    Culture  Setup Time   Final    GRAM NEGATIVE RODS ANAEROBIC BOTTLE ONLY CRITICAL RESULT CALLED TO, READ BACK BY AND VERIFIED WITH: JASON ROBBINS AT 2113 ON 06/17/20 RWW Performed at [facility], [place] [address] [place], [place] [address]    Culture PROVIDENCIA STUARTII (A)  Final   Report Status 06/20/2020 FINAL  Final   Organism ID, Bacteria PROVIDENCIA STUARTII  Final      Susceptibility   Providencia stuartii - MIC*    AMPICILLIN >=32 RESISTANT Resistant     CEFAZOLIN >=64 RESISTANT Resistant     CEFEPIME 0.25 SENSITIVE Sensitive     CEFTAZIDIME <=1 SENSITIVE Sensitive     CEFTRIAXONE 0.5 SENSITIVE Sensitive     CIPROFLOXACIN 2 INTERMEDIATE Intermediate     GENTAMICIN RESISTANT Resistant     IMIPENEM 2 SENSITIVE Sensitive     TRIMETH/SULFA >=320 RESISTANT Resistant     AMPICILLIN/SULBACTAM >=32 RESISTANT Resistant     PIP/TAZO 8 SENSITIVE Sensitive     * PROVIDENCIA STUARTII  Blood Culture ID Panel (Reflexed)     Status: None   Collection Time: 06/15/20 12:44 AM  Result  Value Ref Range Status   Enterococcus species NOT DETECTED NOT DETECTED Final   Listeria monocytogenes NOT DETECTED NOT DETECTED Final   Staphylococcus species NOT DETECTED NOT DETECTED Final   Staphylococcus aureus (BCID) NOT DETECTED NOT DETECTED Final   Streptococcus species NOT DETECTED NOT DETECTED Final   Streptococcus agalactiae NOT DETECTED NOT DETECTED Final   Streptococcus pneumoniae NOT DETECTED NOT DETECTED Final   Streptococcus pyogenes NOT DETECTED NOT DETECTED Final   Acinetobacter baumannii NOT DETECTED NOT DETECTED Final   Enterobacteriaceae species NOT DETECTED NOT DETECTED Final   Enterobacter cloacae complex NOT DETECTED NOT DETECTED Final   Escherichia coli NOT DETECTED NOT DETECTED Final   Klebsiella oxytoca NOT DETECTED NOT DETECTED Final   Klebsiella pneumoniae NOT DETECTED NOT DETECTED Final   Proteus species NOT DETECTED NOT DETECTED Final   Serratia marcescens NOT DETECTED NOT DETECTED Final   Haemophilus influenzae NOT DETECTED NOT DETECTED Final   Neisseria meningitidis NOT DETECTED NOT DETECTED Final   Pseudomonas aeruginosa NOT DETECTED NOT DETECTED Final   Candida albicans NOT DETECTED NOT DETECTED Final   Candida glabrata NOT DETECTED NOT DETECTED Final   Candida krusei NOT DETECTED NOT DETECTED Final   Candida parapsilosis NOT DETECTED NOT DETECTED Final   Candida tropicalis NOT DETECTED NOT DETECTED Final    Comment: Performed at [facility], [address], [place], [place] [address]  Urine culture     Status: Abnormal   Collection Time: 06/15/20 12:48 AM   Specimen: Urine, Clean Catch  Result Value Ref Range Status   Specimen Description   Final    URINE, CLEAN CATCH Performed at [facility], [address] [place], [place] [address]    Special Requests   Final    NONE Performed at [facility], [address] [place], [place] [address]    Culture (A)  Final    60,000 COLONIES/mL ESCHERICHIA COLI Confirmed  Extended Spectrum Beta-Lactamase Producer (ESBL).  In bloodstream infections from ESBL organisms, carbapenems are preferred over piperacillin/tazobactam. They are shown to have a lower risk of mortality.  Report Status 06/18/2020 FINAL  Final   Organism ID, Bacteria ESCHERICHIA COLI (A)  Final      Susceptibility   Escherichia coli - MIC*    AMPICILLIN >=32 RESISTANT Resistant     CEFAZOLIN >=64 RESISTANT Resistant     CEFTRIAXONE >=64 RESISTANT Resistant     CIPROFLOXACIN >=4 RESISTANT Resistant     GENTAMICIN <=1 SENSITIVE Sensitive     IMIPENEM <=0.25 SENSITIVE Sensitive     NITROFURANTOIN <=16 SENSITIVE Sensitive     TRIMETH/SULFA <=20 SENSITIVE Sensitive     AMPICILLIN/SULBACTAM 4 SENSITIVE Sensitive     PIP/TAZO <=4 SENSITIVE Sensitive     * 60,000 COLONIES/mL ESCHERICHIA COLI  Culture, blood (routine x 2)     Status: None   Collection Time: 06/15/20 12:48 AM   Specimen: BLOOD RIGHT FOREARM  Result Value Ref Range Status   Specimen Description BLOOD RIGHT FOREARM  Final   Special Requests   Final    BOTTLES DRAWN AEROBIC AND ANAEROBIC Blood Culture adequate volume   Culture   Final    NO GROWTH 5 DAYS Performed at [facility], [address], [place], [place] [address]    Report Status 06/20/2020 FINAL  Final  SARS Coronavirus 2 by RT PCR (hospital order, performed in [facility] hospital lab) Nasopharyngeal Nasopharyngeal Swab     Status: None   Collection Time: 06/15/20  2:05 AM   Specimen: Nasopharyngeal Swab  Result Value Ref Range Status   SARS Coronavirus 2 NEGATIVE NEGATIVE Final    Comment: (NOTE) SARS-CoV-2 target nucleic acids are NOT DETECTED.  The SARS-CoV-2 RNA is generally detectable in upper and lower respiratory specimens during the acute phase of infection. The lowest concentration of SARS-CoV-2 viral copies this assay can detect is 250 copies / mL. A negative result does not preclude SARS-CoV-2 infection and should not be used as the sole  basis for treatment or other patient management decisions.  A negative result may occur with improper specimen collection / handling, submission of specimen other than nasopharyngeal swab, presence of viral mutation(s) within the areas targeted by this assay, and inadequate number of viral copies (<250 copies / mL). A negative result must be combined with clinical observations, patient history, and epidemiological information.  Fact Sheet for Patients:   StrictlyIdeas.no  Fact Sheet for Healthcare Providers: BankingDealers.co.za  This test is not yet approved or  cleared by the [place] FDA and has been authorized for detection and/or diagnosis of SARS-CoV-2 by FDA under an Emergency Use Authorization (EUA).  This EUA will remain in effect (meaning this test can be used) for the duration of the COVID-19 declaration under Section 564(b)(1) of the Act, 21 U.S.C. section 360bbb-3(b)(1), unless the authorization is terminated or revoked sooner.  Performed at [facility], [address] [place], [place] [address]   MRSA PCR Screening     Status: Abnormal   Collection Time: 06/21/20 10:46 AM   Specimen: Nasopharyngeal  Result Value Ref Range Status   MRSA by PCR POSITIVE (A) NEGATIVE Final    Comment:        The GeneXpert MRSA Assay (FDA approved for NASAL specimens only), is one component of a comprehensive MRSA colonization surveillance program. It is not intended to diagnose MRSA infection nor to guide or monitor treatment for MRSA infections. RESULT CALLED TO, READ BACK BY AND VERIFIED WITH: Di [facility] 06/21/20 AT 1813 BY ACR Performed at [facility], [address] [place],  [address]  Radiology Studies: No results found.  Scheduled Meds: . Chlorhexidine Gluconate Cloth  6 each Topical Daily  . dexamethasone (DECADRON) injection  4 mg Intravenous Q6H  . haloperidol lactate  0.5  mg Intravenous Q4H  . mupirocin ointment   Nasal BID  . octreotide  100 mcg Intravenous TID  . ondansetron (ZOFRAN) IV  4 mg Intravenous Q6H   Or  . ondansetron  4 mg Oral Q6H  . scopolamine  1 patch Transdermal Q72H  . sodium chloride flush  3 mL Intravenous Q12H   Continuous Infusions: . morphine 2.5 mg/hr (06/24/20 1647)     LOS: 9 days    Enzo Bi, MD [facility]  If 7PM-7AM, please contact night-coverage [email or web address]  06/24/2020, 6:58 PM

## 2020-06-24 NOTE — Progress Notes (Addendum)
Daily Progress Note   Patient Name: Jerry [name]       Date: 06/24/2020 DOB: 1955/12/18  Age: 64 y.o. MRN#: [id number] Attending Physician: Enzo Bi, MD Primary Care Physician: [name], MD Admit Date: 06/15/2020  Reason for Consultation/Follow-up: Establishing goals of care  Subjective: Patient is resting in bed with eyes closed. Abdomen is distended. He has dark brown vomit on a towel in bed with him. Re-scheduled meeting with [name] yesterday evening for this morning as she was not able to be here yesterday morning as planned. No family is present at bedside this morning at the time of our meeting.     Patient is hospice facility appropriate if HPOA [name] chooses a comfort route. If chosen, recommend placing referral to [facility] for hospice facility placement.  < 2 week prognosis.  ADDENDUM:  Patient is uncomfortable. Spoke with [name] via phone.  She states she would like to focus only on his comfort until his death. We discussed a venting PEG. She states she will have to give this some thought.   I completed a MOST form today through [employer] and the signed original was placed in the chart. A photocopy was placed in the chart to be scanned into EMR. The patient outlined their wishes for the following treatment decisions:  Cardiopulmonary Resuscitation: Do Not Attempt Resuscitation (DNR/No CPR)  Medical Interventions: Comfort Measures: Keep clean, warm, and dry. Use medication by any route, positioning, wound care, and other measures to relieve pain and suffering. Use oxygen, suction and manual treatment of airway obstruction as needed for comfort. Do not transfer to the hospital unless comfort needs cannot be met in current location.  Antibiotics: No antibiotics (use other measures to relieve  symptoms)  IV Fluids: No IV fluids (provide other measures to ensure comfort)  Feeding Tube: No feeding tube       Length of Stay: 9  Current Medications: Scheduled Meds:  . benztropine  0.5 mg Oral BID  . Chlorhexidine Gluconate Cloth  6 each Topical Daily  . docusate sodium  100 mg Oral BID  . enoxaparin (LOVENOX) injection  40 mg Subcutaneous Q24H  . feeding supplement (PRO-STAT SUGAR FREE 64)  30 mL Oral TID WC  . ferrous sulfate  325 mg Oral BID WC  . fluPHENAZine  15 mg Oral BID  .  furosemide  20 mg Oral BID  . hydrALAZINE  50 mg Oral QID  . hydrocortisone  10 mg Oral BID  . hydroxychloroquine  200 mg Oral BID  . levothyroxine  200 mcg Oral QAC breakfast  . metoprolol tartrate  75 mg Oral BID  . multivitamin with minerals  1 tablet Oral Daily  . mupirocin ointment   Nasal BID  . pantoprazole  40 mg Oral BH-q7a  . polyethylene glycol  17 g Oral Daily  . potassium chloride  10 mEq Oral Daily  . tamsulosin  0.4 mg Oral QPC breakfast    Continuous Infusions: . dextrose 5 % and 0.45 % NaCl with KCl 40 mEq/L 50 mL/hr at 06/24/20 0450    PRN Meds: acetaminophen **OR** acetaminophen, albuterol, LORazepam, magnesium hydroxide, morphine injection, ondansetron **OR** ondansetron (ZOFRAN) IV, promethazine, traMADol, traZODone  Physical Exam Pulmonary:     Effort: Pulmonary effort is normal.  Neurological:     Mental Status: He is alert.             Vital Signs: BP (!) 123/87 (BP Location: Left Arm)   Pulse 100   Temp 98.7 F (37.1 C) (Oral)   Resp 20   Ht '6\' 2"'  (1.88 m)   Wt 122.9 kg   SpO2 100%   BMI 34.79 kg/m  SpO2: SpO2: 100 % O2 Device: O2 Device: Nasal Cannula O2 Flow Rate: O2 Flow Rate (L/min): 2 L/min  Intake/output summary:  No intake or output data in the 24 hours ending 06/24/20 0952 LBM: Last BM Date: 06/23/20 Baseline Weight: Weight: 122.9 kg Most recent weight: Weight: 122.9 kg       Palliative Assessment/Data: 10%      Patient Active  Problem List   Diagnosis Date Noted  . Nausea vomiting and diarrhea   . Leukocytosis   . Colorectal cancer ([place])   . Abdominal pain 06/15/2020  . Iron deficiency anemia due to chronic blood loss 12/10/2019  . Colon adenocarcinoma ([place]) 11/08/2019  . DNR (do not resuscitate) 11/08/2019  . Chronic diastolic CHF (congestive heart failure) ([place]) 11/08/2019  . Rheumatoid arthritis ([place]) 11/08/2019  . Chronic respiratory failure with hypoxia ([place]) 11/08/2019  . Palliative care encounter   . Mass of colon 11/04/2019  . Schizo affective schizophrenia ([place])   . 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 ([place]) 10/30/2019  . Primary adrenocortical insufficiency ([place]) 10/30/2019  . Anemia 10/30/2019  . Gastrointestinal hemorrhage   . Chronic obstructive pulmonary disease ([place])   . COVID-19 virus infection 09/11/2019  . Normocytic anemia 08/25/2019  . Community acquired pneumonia   . DNR (do not resuscitate) discussion   . SOB (shortness of breath) 07/16/2019  . Obesity, Class III, BMI 40-49.9 (morbid obesity) ([place])   . Schizophrenia ([place])   . Palliative care by specialist   . Advance care planning   . Goals of care, counseling/discussion   . CHF (congestive heart failure) ([place])   . Altered mental status   . Septic shock ([place]) 08/02/2017  . Lower urinary tract infectious disease   . Pneumonia 06/21/2017  . Thrombocytopenia ([place]) 06/21/2017  . Leukopenia 06/21/2017  . Pressure injury of skin 06/21/2017    Palliative Care Assessment & Plan    Recommendations/Plan:  Oral Cortef D/C'd as patient is unable to take pills. IV Decadron initiated.  Robinul PRN for excessive secretions  Haldol for agitation  Ativan PRN for anxiety  Morphine infusion with boluses for pain  Octreotide to help nausea/vomiting/ bowel secretions   Zofran  PRN, Scopolamine patch, and Phenergan PRN for N/V.     Code Status:    Code Status Orders  (From admission, onward)         Start      Ordered   06/16/20 1250  Do not attempt resuscitation (DNR)  Continuous       Question Answer Comment  In the event of cardiac or respiratory ARREST Do not call a "code blue"   In the event of cardiac or respiratory ARREST Do not perform Intubation, CPR, defibrillation or ACLS   In the event of cardiac or respiratory ARREST Use medication by any route, position, wound care, and other measures to relive pain and suffering. May use oxygen, suction and manual treatment of airway obstruction as needed for comfort.   Comments d/w niece [name] and MOST form in [employer]      06/16/20 1250        Code Status History    Date Active Date Inactive Code Status Order ID Comments User Context   06/15/2020 1217 06/16/2020 1250 Partial Code [id number]  Christel Mormon, MD Inpatient   06/15/2020 0452 06/15/2020 1217 DNR [id number]  Christel Mormon, MD ED   11/10/2019 2015 11/20/2019 1823 DNR [id number]  Kathie Dike, MD Inpatient   10/30/2019 1525 11/07/2019 1004 DNR [id number]  Fritzi Mandes, MD ED   09/11/2019 1024 09/16/2019 2020 DNR [id number]  Thurnell Lose, MD Inpatient   07/25/2019 1636 07/28/2019 2043 DNR [id number]  Hillary Bow, MD Inpatient   07/16/2019 1733 07/25/2019 1636 Full Code [id number]  Dustin Flock, MD ED   12/25/2017 2105 12/30/2017 1646 Full Code [id number]  Vaughan Basta, MD Inpatient   08/02/2017 1447 08/13/2017 1844 Full Code [id number]  Bettey Costa, MD Inpatient   06/21/2017 1527 07/02/2017 1628 Full Code [id number]  Theodoro Grist, MD Inpatient   Advance Care Planning Activity       Prognosis:   < 2 weeks  Coloretal CA and patient is not a candidate for surgery. No PO inake   Care plan was discussed with MD, RN, Billey Chang NP  Thank you for allowing the Palliative Medicine Team to assist in the care of this patient.   Total Time 15 min 2:30-3:20= 21mn Prolonged Time Billed yes       Greater than 50%  of this time was spent counseling and coordinating care related to the above  assessment and plan.  CAsencion Gowda NP  Please contact Palliative Medicine Team phone at [phone] questions and concerns.

## 2020-06-24 NOTE — TOC Progression Note (Signed)
Transition of Care [facility]) - Progression Note    Patient Details  Name: Jerry [name] MRN: [id number] Date of Birth: January 20, 1956  [facility] CM/SW Contact  Shelbie Hutching, RN Phone Number: 06/24/2020, 2:44 PM  Clinical Narrative:    RNCM spoke with patient's niece [name] after MD spoke with the family in the room.  [name] and the patient's sister have decided that they would like to make the patient comfort care and for him to go to residential hospice at the [facility] facility.  Flo Shanks with [place] given referral for residential hospice.  There are no hospice beds available today.    Expected Discharge Plan: [place] Barriers to Discharge: Hospice Bed not available  Expected Discharge Plan and Services Expected Discharge Plan: [place]   Discharge Planning Services: CM Consult   Living arrangements for the past 2 months: Skilled Nursing Facility                                       Social Determinants of Health (SDOH) Interventions    Readmission Risk Interventions Readmission Risk Prevention Plan 06/17/2020 11/20/2019 11/03/2019  Transportation Screening Complete Complete Complete  HRI or Home Care Consult - - Not Complete  HRI or Home Care Consult comments - - Pt lives at a [place] for [place] Planning/Counseling - - English - - Not Applicable  Medication Review Press photographer) Complete Complete Complete  PCP or Specialist appointment within 3-5 days of discharge Complete (No Data) -  [place] or Home Care Consult Complete - -  SW Recovery Care/Counseling Consult Complete - -  Palliative Care Screening Complete Complete -  Skilled Nursing Facility Complete Complete -  Some recent data might be hidden

## 2020-06-25 MED ORDER — LIDOCAINE-PRILOCAINE 2.5-2.5 % EX CREA
TOPICAL_CREAM | Freq: Once | CUTANEOUS | Status: AC
  Filled 2020-06-25: qty 5

## 2020-06-25 MED ORDER — SODIUM CHLORIDE 0.9% FLUSH
10.0000 mL | Freq: Two times a day (BID) | INTRAVENOUS | Status: DC
  Administered 2020-06-25 – 2020-06-27 (×5): 10 mL

## 2020-06-25 MED ORDER — SODIUM CHLORIDE 0.9% FLUSH: 10.0000 mL | INTRAVENOUS | Status: DC | PRN

## 2020-06-25 NOTE — Progress Notes (Signed)
Daily Progress Note   Patient Name: Jerry [name]       Date: 06/25/2020 DOB: March 27, 1956  Age: 64 y.o. MRN#: [id number] Attending Physician: Enzo Bi, MD Primary Care Physician: [name], MD Admit Date: 06/15/2020  Reason for Consultation/Follow-up: Establishing goals of care  Subjective: Patient is resting in bed with eyes closed. He appears very comfortable at this time. No family at bedside. No changes to regimen.      Length of Stay: 10  Current Medications: Scheduled Meds:  . Chlorhexidine Gluconate Cloth  6 each Topical Daily  . dexamethasone (DECADRON) injection  4 mg Intravenous Q6H  . haloperidol lactate  0.5 mg Intravenous Q4H  . mupirocin ointment   Nasal BID  . octreotide  100 mcg Intravenous TID  . ondansetron (ZOFRAN) IV  4 mg Intravenous Q6H   Or  . ondansetron  4 mg Oral Q6H  . scopolamine  1 patch Transdermal Q72H  . sodium chloride flush  3 mL Intravenous Q12H    Continuous Infusions: . morphine 3 mg/hr (06/25/20 0901)    PRN Meds: acetaminophen **OR** acetaminophen, albuterol, antiseptic oral rinse, glycopyrrolate **OR** glycopyrrolate, LORazepam **OR** LORazepam **OR** LORazepam, morphine, polyvinyl alcohol, promethazine, sodium chloride flush  Physical Exam Constitutional:      Comments: Eyes closed.   Pulmonary:     Effort: Pulmonary effort is normal.             Vital Signs: BP (!) 143/97 (BP Location: Left Arm)   Pulse (!) 109   Temp 97.6 F (36.4 C) (Oral)   Resp 19   Ht 6\' 2"  (1.88 m)   Wt 122.9 kg   SpO2 100%   BMI 34.79 kg/m  SpO2: SpO2: 100 % O2 Device: O2 Device: Room Air O2 Flow Rate: O2 Flow Rate (L/min): 3 L/min  Intake/output summary:   Intake/Output Summary (Last 24 hours) at 06/25/2020 1029 Last data filed at  06/25/2020 6606 Gross per 24 hour  Intake 40.22 ml  Output 250 ml  Net -209.78 ml   LBM: Last BM Date: 06/23/20 Baseline Weight: Weight: 122.9 kg Most recent weight: Weight: 122.9 kg       Palliative Assessment/Data: 10%      Patient Active Problem List   Diagnosis Date Noted  . Nausea vomiting and diarrhea   . Leukocytosis   .  Colorectal cancer ([place])   . Abdominal pain 06/15/2020  . Iron deficiency anemia due to chronic blood loss 12/10/2019  . 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 DNR (do not resuscitate) 11/08/2019  . Chronic diastolic CHF (congestive heart failure) ([place]) 11/08/2019  . Rheumatoid arthritis ([place]) 11/08/2019  . Chronic respiratory failure with hypoxia ([place]) 11/08/2019  . Palliative care encounter   . Mass of colon 11/04/2019  . Schizo affective schizophrenia ([place])   . 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 Primary adrenocortical insufficiency ([place]) 10/30/2019  . Anemia 10/30/2019  . Gastrointestinal hemorrhage   . Chronic obstructive pulmonary disease ([place])   . COVID-19 virus infection 09/11/2019  . Normocytic anemia 08/25/2019  . Community acquired pneumonia   . DNR (do not resuscitate) discussion   . SOB (shortness of breath) 07/16/2019  . Obesity, Class III, BMI 40-49.9 (morbid obesity) ([place])   . Schizophrenia ([place])   . Palliative care by specialist   . Advance care planning   . Goals of care, counseling/discussion   . CHF (congestive heart failure) ([place])   . Altered mental status   . Septic shock ([place]) 08/02/2017  . Lower urinary tract infectious disease   . Pneumonia 06/21/2017  . Thrombocytopenia ([place]) 06/21/2017  . Leukopenia 06/21/2017  . Pressure injury of skin 06/21/2017    Palliative Care Assessment & Plan    Recommendations/Plan:  Oral Cortef D/C'd as patient is unable to take pills. IV Decadron initiated.  Robinul PRN for excessive secretions  Haldol for agitation and nausea.    Ativan PRN  for anxiety  Morphine infusion with boluses for pain  Octreotide to help nausea/vomiting/ bowel secretions   Zofran PRN, Scopolamine patch, and Phenergan PRN for N/V.     Code Status:    Code Status Orders  (From admission, onward)         Start     Ordered   06/16/20 1250  Do not attempt resuscitation (DNR)  Continuous       Question Answer Comment  In the event of cardiac or respiratory ARREST Do not call a "code blue"   In the event of cardiac or respiratory ARREST Do not perform Intubation, CPR, defibrillation or ACLS   In the event of cardiac or respiratory ARREST Use medication by any route, position, wound care, and other measures to relive pain and suffering. May use oxygen, suction and manual treatment of airway obstruction as needed for comfort.   Comments d/w niece [name] and MOST form in [place]      06/16/20 1250        Code Status History    Date Active Date Inactive Code Status Order ID Comments User Context   06/15/2020 1217 06/16/2020 1250 Partial Code [id number]  Christel Mormon, MD Inpatient   06/15/2020 0452 06/15/2020 1217 DNR [id number]  Christel Mormon, MD ED   11/10/2019 2015 11/20/2019 1823 DNR [id number]  Kathie Dike, MD Inpatient   10/30/2019 1525 11/07/2019 1004 DNR [id number]  Fritzi Mandes, MD ED   09/11/2019 1024 09/16/2019 2020 DNR [id number]  Thurnell Lose, MD Inpatient   07/25/2019 1636 07/28/2019 2043 DNR [id number]  Hillary Bow, MD Inpatient   07/16/2019 1733 07/25/2019 1636 Full Code [id number]  Dustin Flock, MD ED   12/25/2017 2105 12/30/2017 1646 Full Code [id number]  Vaughan Basta, MD Inpatient   08/02/2017 1447 08/13/2017 1844 Full Code [id number]  Bettey Costa, MD Inpatient   06/21/2017 1527 07/02/2017 1628 Full Code [id number]  Theodoro Grist,  MD Inpatient   Advance Care Planning Activity       Prognosis:   < 2 weeks  Coloretal CA and patient is not a candidate for surgery. No PO inake   Care plan was discussed with Billey Chang  NP  Thank you for allowing the Palliative Medicine Team to assist in the care of this patient.   Total Time 15 min  Prolonged Time Billed no      Greater than 50%  of this time was spent counseling and coordinating care related to the above assessment and plan.  Asencion Gowda, NP  Please contact Palliative Medicine Team phone at [phone] for questions and concerns.

## 2020-06-25 NOTE — Progress Notes (Signed)
VAST consulted to access pt's port. Requested pt's nurse, Tiffanie order and place EMLA cream for 1 hour prior to access. Tiffanie verbalized understanding.

## 2020-06-25 NOTE — Progress Notes (Signed)
PROGRESS NOTE    Jerry [name]  CXK:[id number] DOB: 1956/05/25 DOA: 06/15/2020 PCP: [name], MD   Brief Narrative:  Jerry [name]  is a 64 y.o. AA male with a known history of BPH, CHF, COPD, hypertension, colorectal carcinoma and rheumatoid arthritis, presented to the emergency room with acute onset of nausea without significant vomiting as well as diarrhea and abdominal pain.  Symptoms have been going on over the last couple of days at [facility] where he resides.  He has been having occasional cough without wheezing or dyspnea chest pain or palpitations.  No bilious vomitus or hematemesis.  No dysuria, oliguria or hematuria or flank pain.  CT abdomen with increasing mass involving the ascending colon.  Not a surgical candidate.  Palliative care was consulted.  Patient has a legal guardian.  Subjective: Pt appeared comfortable but pain meds had to be increased with the PCA pump.     Assessment & Plan:   Active Problems:   Lower urinary tract infectious disease   Abdominal pain   Nausea vomiting and diarrhea   Leukocytosis   Colorectal cancer ([place])   Comfort care status --all current orders comfort care measure, entered by palliative provider, do not change. --increase dose with PCA pump PRN, per protocol --Need everything in IV since pt can not take in orals --will discharge to hospice facility  N/V and Abdominal pain  secondary to obstruction from enlarging ascending colon mass history of colorectal cancer.   Surgery was consulted and he is not a surgical candidate.  There was some initial concern of sepsis due to hypotension, tachycardia and leukocytosis.  Markedly elevated procalcitonin at 132.  Which started improving.  Leukocytosis improving.  Blood cultures remain negative, urine culture with more than 60,000 colonies of E. coli.  He was started on meropenem and vancomycin (d/c'ed).  Completed 7 days of meropenem for possible intra-abdominal infection.  Black  liquid stool --liquid stool leaking out due to colonic obstruction.  Likely had bleeding from the colon mass. --continue rectal tube  UTI.   Urine culture with more than 60,000 colonies of E. coli.   --Completed 7 days of meropenem --continue Foley for comfort  AKI.    Hypoglycemia 2/2 poor PO intake --BG in 50's.  Not a candidate for tube feed due to distal obstruction. --Hold MIVF due to comfort care  Hypokalemia.  Resolved.  Hypertension.   Blood pressure within goal. -d/c'ed home metop, lasix and hydralazine due to comfort care  Hypothyroidism.   -T4 wnl -d/c'ed Synthroid due to comfort care  Rheumatoid arthritis. -d/c'ed home Plaquenil due to comfort care   Objective: Vitals:   06/24/20 0308 06/24/20 0310 06/24/20 0746 06/25/20 0745  BP: 110/68  (!) 123/87 (!) 143/97  Pulse: (!) 115 (!) 110 100 (!) 109  Resp: 20  20 19   Temp: 98.6 F (37 C)  98.7 F (37.1 C) 97.6 F (36.4 C)  TempSrc: Oral  Oral Oral  SpO2: 100% 100% 100% 100%  Weight:      Height:        Intake/Output Summary (Last 24 hours) at 06/26/2020 0239 Last data filed at 06/25/2020 1341 Gross per 24 hour  Intake 40.22 ml  Output 400 ml  Net -359.78 ml   Filed Weights   06/15/20 0507  Weight: 122.9 kg    Examination:  Constitutional: NAD, lethargic, arousable, confused HEENT: conjunctivae and lids normal, EOMI CV: No cyanosis.   RESP: normal respiratory effort, on 3L SKIN: warm, dry  Neuro:  II - XII grossly intact.     DVT prophylaxis: d/c'ed Lovenox due to comfort care Code Status: DNR comfort care Family Communication:  Disposition Plan:  Status is: Inpatient  Remains inpatient appropriate because:Inpatient level of care appropriate due to severity of illness   Dispo: The patient is from: SNF              Anticipated d/c is to: hospice facility              Anticipated d/c date is: whenever bed available at hospice facility              Patient currently is not medically  stable to d/c.  Pt will need to be discharged to hospice facility due to need for IV meds for comfort care.   Consultants:   Surgery  Palliative care  Procedures:  Antimicrobials:  Meropenem  Data Reviewed: I have personally reviewed following labs and imaging studies  CBC: Recent Labs  Lab 06/20/20 0532 06/21/20 0630 06/22/20 0412 06/23/20 0630 06/24/20 0423  WBC 6.4 7.3 6.7 7.6 8.6  HGB 9.4* 9.4* 9.9* 9.3* 9.0*  HCT 28.6* 30.0* 31.8* 29.1* 27.6*  MCV 80.6 83.3 82.6 82.2 80.7  PLT 178 200 242 274 130   Basic Metabolic Panel: Recent Labs  Lab 06/20/20 0532 06/21/20 0630 06/22/20 0412 06/23/20 0630 06/24/20 0423  NA 138 140 138 137 137  K 4.2 4.1 4.0 4.1 4.0  CL 106 107 103 101 101  CO2 26 29 29 29 28   GLUCOSE 88 86 88 80 96  BUN 6* <5* <5* <5* 8  CREATININE 0.41* 0.35* 0.34* 0.41* 0.42*  CALCIUM 7.9* 7.5* 8.1* 8.0* 7.8*  MG 1.6* 1.9 1.8 1.8 1.8   GFR: Estimated Creatinine Clearance: 130 mL/min (A) (by C-G formula based on SCr of 0.42 mg/dL (L)). Liver Function Tests: No results for input(s): AST, ALT, ALKPHOS, BILITOT, PROT, ALBUMIN in the last 168 hours. No results for input(s): LIPASE, AMYLASE in the last 168 hours. No results for input(s): AMMONIA in the last 168 hours. Coagulation Profile: No results for input(s): INR, PROTIME in the last 168 hours. Cardiac Enzymes: No results for input(s): CKTOTAL, CKMB, CKMBINDEX, TROPONINI in the last 168 hours. BNP (last 3 results) No results for input(s): PROBNP in the last 8760 hours. HbA1C: No results for input(s): HGBA1C in the last 72 hours. CBG: Recent Labs  Lab 06/23/20 0508 06/23/20 1117 06/23/20 1853 06/23/20 2334 06/24/20 0533  GLUCAP 72 87 93 74 76   Lipid Profile: No results for input(s): CHOL, HDL, LDLCALC, TRIG, CHOLHDL, LDLDIRECT in the last 72 hours. Thyroid Function Tests: No results for input(s): TSH, T4TOTAL, FREET4, T3FREE, THYROIDAB in the last 72 hours. Anemia Panel: No results  for input(s): VITAMINB12, FOLATE, FERRITIN, TIBC, IRON, RETICCTPCT in the last 72 hours. Sepsis Labs: No results for input(s): PROCALCITON, LATICACIDVEN in the last 168 hours.  Recent Results (from the past 240 hour(s))  MRSA PCR Screening     Status: Abnormal   Collection Time: 06/21/20 10:46 AM   Specimen: Nasopharyngeal  Result Value Ref Range Status   MRSA by PCR POSITIVE (A) NEGATIVE Final    Comment:        The GeneXpert MRSA Assay (FDA approved for NASAL specimens only), is one component of a comprehensive MRSA colonization surveillance program. It is not intended to diagnose MRSA infection nor to guide or monitor treatment for MRSA infections. RESULT CALLED TO, READ BACK BY AND VERIFIED WITH: Di Kindle [facility] 06/21/20  AT 3358 BY ACR Performed at [facility], [address], [place], [place] [address]      Radiology Studies: No results found.  Scheduled Meds:  Chlorhexidine Gluconate Cloth  6 each Topical Daily   dexamethasone (DECADRON) injection  4 mg Intravenous Q6H   haloperidol lactate  0.5 mg Intravenous Q4H   mupirocin ointment   Nasal BID   octreotide  100 mcg Intravenous TID   ondansetron (ZOFRAN) IV  4 mg Intravenous Q6H   Or   ondansetron  4 mg Oral Q6H   scopolamine  1 patch Transdermal Q72H   sodium chloride flush  10-40 mL Intracatheter Q12H   sodium chloride flush  3 mL Intravenous Q12H   Continuous Infusions:  morphine 4.5 mg/hr (06/25/20 1708)     LOS: 11 days    Enzo Bi, MD [facility]  If 7PM-7AM, please contact night-coverage [email or web address]  06/26/2020, 2:39 AM

## 2020-06-25 NOTE — Progress Notes (Signed)
[facility] Room [place] [facility] Liaison RN note:  Visited patient in room. He is resting and appears comfortable. Spoke with his HCPOA and niece  Desean Heemstra over the phone today to let her know that [facility] does not have any beds available to offer today . She voiced understanding and had no questions. Hospital care team is aware.  We will continue to follow and update with bed availability.  Please call with any hospice related questions or concerns.  Thank you.  Zandra Abts, RN [facility] Liaison [phone]

## 2020-06-26 NOTE — Progress Notes (Signed)
PROGRESS NOTE    Kuper Rennels  PPI:[id number] DOB: 02/27/1956 DOA: 06/15/2020 PCP: [name], MD   Brief Narrative:  Jerry [name]  is a 64 y.o. AA male with a known history of BPH, CHF, COPD, hypertension, colorectal carcinoma and rheumatoid arthritis, presented to the emergency room with acute onset of nausea without significant vomiting as well as diarrhea and abdominal pain.  Symptoms have been going on over the last couple of days at [facility] where he resides.  He has been having occasional cough without wheezing or dyspnea chest pain or palpitations.  No bilious vomitus or hematemesis.  No dysuria, oliguria or hematuria or flank pain.  CT abdomen with increasing mass involving the ascending colon.  Not a surgical candidate.  Palliative care was consulted.  Patient has a legal guardian.  Transitioned to complete comfort measures.  Subjective: Patient was asking for some water when seen today.  He was self talking. Appears comfortable.  Assessment & Plan:   Active Problems:   Lower urinary tract infectious disease   Abdominal pain   Nausea vomiting and diarrhea   Leukocytosis   Colorectal cancer ([place])   Comfort care status -all current orders comfort care measure, entered by palliative provider, do not change. -increase dose with PCA pump PRN, per protocol -Need everything in IV since pt can not take in orals -will discharge to hospice facility-once bed is available. -Comfort sips of water.  N/V and Abdominal pain  secondary to obstruction from enlarging ascending colon mass history of colorectal cancer.   Surgery was consulted and he is not a surgical candidate.  There was some initial concern of sepsis due to hypotension, tachycardia and leukocytosis.  Markedly elevated procalcitonin at 132.  Which started improving.  Leukocytosis improving.  Blood cultures remain negative, urine culture with more than 60,000 colonies of E. coli.  He was started on meropenem and  vancomycin (d/c'ed).  Completed 7 days of meropenem for possible intra-abdominal infection.  Black liquid stool -liquid stool leaking out due to colonic obstruction.  Likely had bleeding from the colon mass. -continue rectal tube  UTI.   Urine culture with more than 60,000 colonies of E. coli.   -Completed 7 days of meropenem -continue Foley for comfort   Hypoglycemia 2/2 poor PO intake --BG in 50's.  Not a candidate for tube feed due to distal obstruction. --Hold MIVF due to comfort care  Hypokalemia.  Resolved. No more lab work as patient has been transitioned to full comfort measures.  Hypertension.   Blood pressure within goal. -d/c'ed home metop, lasix and hydralazine due to comfort care  Hypothyroidism.   -T4 wnl -d/c'ed Synthroid due to comfort care  Rheumatoid arthritis. -d/c'ed home Plaquenil due to comfort care   Objective: Vitals:   06/24/20 0746 06/25/20 0745 06/26/20 0427 06/26/20 0816  BP: (!) 123/87 (!) 143/97 115/68 (!) 136/71  Pulse: 100 (!) 109 (!) 110 (!) 119  Resp: 20 19 16 17   Temp: 98.7 F (37.1 C) 97.6 F (36.4 C) 97.6 F (36.4 C) 97.8 F (36.6 C)  TempSrc: Oral Oral Oral Oral  SpO2: 100% 100% 93% (!) 87%  Weight:      Height:        Intake/Output Summary (Last 24 hours) at 06/26/2020 1252 Last data filed at 06/26/2020 0955 Gross per 24 hour  Intake 148.85 ml  Output 400 ml  Net -251.15 ml   Filed Weights   06/15/20 0507  Weight: 122.9 kg    Examination:  General.  Chronically ill-appearing, obese gentleman, in no acute distress. Pulmonary.  Lungs clear bilaterally, normal respiratory effort. CV.  Regular rate and rhythm, no JVD, rub or murmur. Abdomen.  Soft, mild diffuse tenderness, bowel sounds positive. Psychiatry.  Judgment and insight appears impaired.  DVT prophylaxis: d/c'ed Lovenox due to comfort care Code Status: DNR comfort care Family Communication: No family at bedside. Disposition Plan:  Status is:  Inpatient  Remains inpatient appropriate because:Inpatient level of care appropriate due to severity of illness   Dispo: The patient is from: SNF              Anticipated d/c is to: hospice facility              Anticipated d/c date is: whenever bed available at hospice facility             Still no bed.    Consultants:   Surgery  Palliative care  Procedures:  Antimicrobials:   Data Reviewed: I have personally reviewed following labs and imaging studies  CBC: Recent Labs  Lab 06/20/20 0532 06/21/20 0630 06/22/20 0412 06/23/20 0630 06/24/20 0423  WBC 6.4 7.3 6.7 7.6 8.6  HGB 9.4* 9.4* 9.9* 9.3* 9.0*  HCT 28.6* 30.0* 31.8* 29.1* 27.6*  MCV 80.6 83.3 82.6 82.2 80.7  PLT 178 200 242 274 756   Basic Metabolic Panel: Recent Labs  Lab 06/20/20 0532 06/21/20 0630 06/22/20 0412 06/23/20 0630 06/24/20 0423  NA 138 140 138 137 137  K 4.2 4.1 4.0 4.1 4.0  CL 106 107 103 101 101  CO2 26 29 29 29 28   GLUCOSE 88 86 88 80 96  BUN 6* <5* <5* <5* 8  CREATININE 0.41* 0.35* 0.34* 0.41* 0.42*  CALCIUM 7.9* 7.5* 8.1* 8.0* 7.8*  MG 1.6* 1.9 1.8 1.8 1.8   GFR: Estimated Creatinine Clearance: 130 mL/min (A) (by C-G formula based on SCr of 0.42 mg/dL (L)). Liver Function Tests: No results for input(s): AST, ALT, ALKPHOS, BILITOT, PROT, ALBUMIN in the last 168 hours. No results for input(s): LIPASE, AMYLASE in the last 168 hours. No results for input(s): AMMONIA in the last 168 hours. Coagulation Profile: No results for input(s): INR, PROTIME in the last 168 hours. Cardiac Enzymes: No results for input(s): CKTOTAL, CKMB, CKMBINDEX, TROPONINI in the last 168 hours. BNP (last 3 results) No results for input(s): PROBNP in the last 8760 hours. HbA1C: No results for input(s): HGBA1C in the last 72 hours. CBG: Recent Labs  Lab 06/23/20 0508 06/23/20 1117 06/23/20 1853 06/23/20 2334 06/24/20 0533  GLUCAP 72 87 93 74 76   Lipid Profile: No results for input(s): CHOL, HDL,  LDLCALC, TRIG, CHOLHDL, LDLDIRECT in the last 72 hours. Thyroid Function Tests: No results for input(s): TSH, T4TOTAL, FREET4, T3FREE, THYROIDAB in the last 72 hours. Anemia Panel: No results for input(s): VITAMINB12, FOLATE, FERRITIN, TIBC, IRON, RETICCTPCT in the last 72 hours. Sepsis Labs: No results for input(s): PROCALCITON, LATICACIDVEN in the last 168 hours.  Recent Results (from the past 240 hour(s))  MRSA PCR Screening     Status: Abnormal   Collection Time: 06/21/20 10:46 AM   Specimen: Nasopharyngeal  Result Value Ref Range Status   MRSA by PCR POSITIVE (A) NEGATIVE Final    Comment:        The GeneXpert MRSA Assay (FDA approved for NASAL specimens only), is one component of a comprehensive MRSA colonization surveillance program. It is not intended to diagnose MRSA infection nor to guide or monitor treatment  for MRSA infections. RESULT CALLED TO, READ BACK BY AND VERIFIED WITH: Di Kindle [facility] 06/21/20 AT 1813 BY ACR Performed at [facility], [address], [place], [place] [address]      Radiology Studies: No results found.  Scheduled Meds: . Chlorhexidine Gluconate Cloth  6 each Topical Daily  . dexamethasone (DECADRON) injection  4 mg Intravenous Q6H  . haloperidol lactate  0.5 mg Intravenous Q4H  . mupirocin ointment   Nasal BID  . octreotide  100 mcg Intravenous TID  . ondansetron (ZOFRAN) IV  4 mg Intravenous Q6H   Or  . ondansetron  4 mg Oral Q6H  . scopolamine  1 patch Transdermal Q72H  . sodium chloride flush  10-40 mL Intracatheter Q12H  . sodium chloride flush  3 mL Intravenous Q12H   Continuous Infusions: . morphine 4.5 mg/hr (06/26/20 1003)     LOS: 11 days    Lorella Nimrod, MD [facility]  If 7PM-7AM, please contact night-coverage [email or web address]  06/26/2020, 12:52 PM   Notes were created with the help of Dragon.

## 2020-06-27 NOTE — Progress Notes (Signed)
PROGRESS NOTE    Jerry [name]  UYQ:[id number] DOB: November 29, 1955 DOA: 06/15/2020 PCP: [name], MD   Brief Narrative:  Jerry [name]  is a 64 y.o. AA male with a known history of BPH, CHF, COPD, hypertension, colorectal carcinoma and rheumatoid arthritis, presented to the emergency room with acute onset of nausea without significant vomiting as well as diarrhea and abdominal pain.  Symptoms have been going on over the last couple of days at [facility] where he resides.  He has been having occasional cough without wheezing or dyspnea chest pain or palpitations.  No bilious vomitus or hematemesis.  No dysuria, oliguria or hematuria or flank pain.  CT abdomen with increasing mass involving the ascending colon.  Not a surgical candidate.  Palliative care was consulted.  Patient has a legal guardian.  Transitioned to complete comfort measures.  Subjective: Patient was sleeping comfortably when entered in the room, easily arousable.  Once awake he started talking with someone who was not there.  Assessment & Plan:   Active Problems:   Lower urinary tract infectious disease   Abdominal pain   Nausea vomiting and diarrhea   Leukocytosis   Colorectal cancer ([place])   Comfort care status -all current orders comfort care measure, entered by palliative provider, do not change. -increase dose with PCA pump PRN, per protocol -Need everything in IV since pt can not take in orals -will discharge to hospice facility-once bed is available. -Comfort sips of water.  N/V and Abdominal pain  secondary to obstruction from enlarging ascending colon mass history of colorectal cancer.   Surgery was consulted and he is not a surgical candidate.  There was some initial concern of sepsis due to hypotension, tachycardia and leukocytosis.  Markedly elevated procalcitonin at 132.  Which started improving.  Leukocytosis improving.  Blood cultures remain negative, urine culture with more than 60,000 colonies of E.  coli.  He was started on meropenem and vancomycin (d/c'ed).  Completed 7 days of meropenem for possible intra-abdominal infection.  Black liquid stool -liquid stool leaking out due to colonic obstruction.  Likely had bleeding from the colon mass. -continue rectal tube  UTI.   Urine culture with more than 60,000 colonies of E. coli.   -Completed 7 days of meropenem -continue Foley for comfort   Hypoglycemia 2/2 poor PO intake --BG in 50's.  Not a candidate for tube feed due to distal obstruction. --Hold MIVF due to comfort care  Hypokalemia.  Resolved. No more lab work as patient has been transitioned to full comfort measures.  Hypertension.   Blood pressure within goal. -d/c'ed home metop, lasix and hydralazine due to comfort care  Hypothyroidism.   -T4 wnl -d/c'ed Synthroid due to comfort care  Rheumatoid arthritis. -d/c'ed home Plaquenil due to comfort care   Objective: Vitals:   06/26/20 0427 06/26/20 0816 06/27/20 0356 06/27/20 0732  BP: 115/68 (!) 136/71 (!) 149/109 (!) 158/111  Pulse: (!) 110 (!) 119 102 81  Resp: 16 17 18 17   Temp: 97.6 F (36.4 C) 97.8 F (36.6 C) (!) 97.5 F (36.4 C) 98 F (36.7 C)  TempSrc: Oral Oral Oral Oral  SpO2: 93% (!) 87% 100% 100%  Weight:      Height:        Intake/Output Summary (Last 24 hours) at 06/27/2020 1455 Last data filed at 06/27/2020 1124 Gross per 24 hour  Intake 116.32 ml  Output 550 ml  Net -433.68 ml   Filed Weights   06/15/20 0507  Weight:  122.9 kg    Examination:  General.  Chronically ill-appearing, obese gentleman, in no acute distress. Pulmonary.  Lungs clear bilaterally, normal respiratory effort. CV.  Regular rate and rhythm, no JVD, rub or murmur. Abdomen.  Soft, mild diffuse tenderness, bowel sounds positive. Psychiatry.  Judgment and insight appears impaired.  DVT prophylaxis: d/c'ed Lovenox due to comfort care Code Status: DNR comfort care Family Communication: No family at  bedside. Disposition Plan:  Status is: Inpatient  Remains inpatient appropriate because:Inpatient level of care appropriate due to severity of illness   Dispo: The patient is from: SNF              Anticipated d/c is to: hospice facility              Anticipated d/c date is: whenever bed available at hospice facility             Still no bed.    Consultants:   Surgery  Palliative care  Procedures:  Antimicrobials:   Data Reviewed: I have personally reviewed following labs and imaging studies  CBC: Recent Labs  Lab 06/21/20 0630 06/22/20 0412 06/23/20 0630 06/24/20 0423  WBC 7.3 6.7 7.6 8.6  HGB 9.4* 9.9* 9.3* 9.0*  HCT 30.0* 31.8* 29.1* 27.6*  MCV 83.3 82.6 82.2 80.7  PLT 200 242 274 161   Basic Metabolic Panel: Recent Labs  Lab 06/21/20 0630 06/22/20 0412 06/23/20 0630 06/24/20 0423  NA 140 138 137 137  K 4.1 4.0 4.1 4.0  CL 107 103 101 101  CO2 29 29 29 28   GLUCOSE 86 88 80 96  BUN <5* <5* <5* 8  CREATININE 0.35* 0.34* 0.41* 0.42*  CALCIUM 7.5* 8.1* 8.0* 7.8*  MG 1.9 1.8 1.8 1.8   GFR: Estimated Creatinine Clearance: 130 mL/min (A) (by C-G formula based on SCr of 0.42 mg/dL (L)). Liver Function Tests: No results for input(s): AST, ALT, ALKPHOS, BILITOT, PROT, ALBUMIN in the last 168 hours. No results for input(s): LIPASE, AMYLASE in the last 168 hours. No results for input(s): AMMONIA in the last 168 hours. Coagulation Profile: No results for input(s): INR, PROTIME in the last 168 hours. Cardiac Enzymes: No results for input(s): CKTOTAL, CKMB, CKMBINDEX, TROPONINI in the last 168 hours. BNP (last 3 results) No results for input(s): PROBNP in the last 8760 hours. HbA1C: No results for input(s): HGBA1C in the last 72 hours. CBG: Recent Labs  Lab 06/23/20 0508 06/23/20 1117 06/23/20 1853 06/23/20 2334 06/24/20 0533  GLUCAP 72 87 93 74 76   Lipid Profile: No results for input(s): CHOL, HDL, LDLCALC, TRIG, CHOLHDL, LDLDIRECT in the last 72  hours. Thyroid Function Tests: No results for input(s): TSH, T4TOTAL, FREET4, T3FREE, THYROIDAB in the last 72 hours. Anemia Panel: No results for input(s): VITAMINB12, FOLATE, FERRITIN, TIBC, IRON, RETICCTPCT in the last 72 hours. Sepsis Labs: No results for input(s): PROCALCITON, LATICACIDVEN in the last 168 hours.  Recent Results (from the past 240 hour(s))  MRSA PCR Screening     Status: Abnormal   Collection Time: 06/21/20 10:46 AM   Specimen: Nasopharyngeal  Result Value Ref Range Status   MRSA by PCR POSITIVE (A) NEGATIVE Final    Comment:        The GeneXpert MRSA Assay (FDA approved for NASAL specimens only), is one component of a comprehensive MRSA colonization surveillance program. It is not intended to diagnose MRSA infection nor to guide or monitor treatment for MRSA infections. RESULT CALLED TO, READ BACK BY AND VERIFIED  WITHDi Kindle [facility] 06/21/20 AT 8270 BY ACR Performed at [facility], [address] [place], [place] [address]      Radiology Studies: No results found.  Scheduled Meds: . Chlorhexidine Gluconate Cloth  6 each Topical Daily  . dexamethasone (DECADRON) injection  4 mg Intravenous Q6H  . haloperidol lactate  0.5 mg Intravenous Q4H  . mupirocin ointment   Nasal BID  . octreotide  100 mcg Intravenous TID  . ondansetron (ZOFRAN) IV  4 mg Intravenous Q6H   Or  . ondansetron  4 mg Oral Q6H  . scopolamine  1 patch Transdermal Q72H  . sodium chloride flush  10-40 mL Intracatheter Q12H  . sodium chloride flush  3 mL Intravenous Q12H   Continuous Infusions: . morphine 4.5 mg/hr (06/27/20 1124)     LOS: 12 days    Lorella Nimrod, MD [facility]  If 7PM-7AM, please contact night-coverage [email or web address]  06/27/2020, 2:55 PM   This record has been created using Systems analyst. Errors have been sought and corrected,but may not always be located. Such creation errors do not reflect on the standard of care.

## 2020-06-28 MED ORDER — POLYVINYL ALCOHOL 1.4 % OP SOLN: 1.0000 [drp] | Freq: Four times a day (QID) | OPHTHALMIC | 0 refills | Status: AC | PRN

## 2020-06-28 MED ORDER — MUPIROCIN 2 % EX OINT: TOPICAL_OINTMENT | Freq: Two times a day (BID) | CUTANEOUS | 0 refills | Status: AC

## 2020-06-28 MED ORDER — LORAZEPAM 2 MG/ML IJ SOLN: 1.0000 mg | INTRAMUSCULAR | Status: DC | PRN

## 2020-06-28 MED ORDER — HALOPERIDOL LACTATE 5 MG/ML IJ SOLN
1.0000 mg | INTRAMUSCULAR | Status: DC
  Administered 2020-06-28: 19:00:00 1 mg via INTRAVENOUS
  Filled 2020-06-28: qty 1

## 2020-06-28 NOTE — Progress Notes (Signed)
[facility] Liaison note:  Jerry [name] is able to offer a bed today. Writer spoke via telephone to patient's guardian niece Benedicto Capozzi, who has accepted the bed and will sign consents via Docusign later this afternoon. Hospital care team updated. Plan is for transport to the Hospice home this evening via EMS. Writer to call report and set up transport. Signed out of facility DNR in place in discharge packet. Thank you for the opportunity to be involved in the care of this patient and his family.   Flo Shanks BSN, RN, [place] [phone]

## 2020-06-28 NOTE — Progress Notes (Signed)
[facility] Liaison note:  EMS notified for transport. Staff RN Denice Paradise and aide Lovena Le notified. Flo Shanks BSN RN, [place]  [phone]

## 2020-06-28 NOTE — Progress Notes (Signed)
PALLIATIVE NOTE  Chart reviewed and updates received from RN.   Patient awake and appears comfortable in bed. Continues on morphine drip. No signs of nausea or vomiting. Patient is alert to self only. His 2 nieces are at the bedside. Education provided on EOL. Niece, Ava requesting additional clarification on patient's condition and diagnosis. Detailed updates and education provided. Family verbalized understanding and appreciation of care provided.   RN aware to continue with PRN medications with a goal of comfort. Patient did have agitation episode earlier as reported with relief via Haldol.   Plan -continue full comfort care measures -pending Hospice home transfer Santiago Glad, RN [facility] Liaison) provided updates.  -PMT will continue to support as needed  Time Total: 25 min.   Visit consisted of counseling and education dealing with the complex and emotionally intense issues of symptom management and palliative care in the setting of serious and potentially life-threatening illness.Greater than 50%  of this time was spent counseling and coordinating care related to the above assessment and plan.  Alda Lea, AGPCNP-BC  Palliative Medicine Team [phone]

## 2020-06-28 NOTE — Discharge Summary (Signed)
Physician Discharge Summary  Jerry [name] ZOX:[id number] DOB: 1956/03/24 DOA: 06/15/2020  PCP: [name], MD  Admit date: 06/15/2020 Discharge date: 06/28/2020  Admitted From: SNF Disposition: Hospice home  Recommendations for Outpatient Follow-up:  1. Please follow up on the following pending results: None  Home Health: No Equipment/Devices: None Discharge Condition: Guarded CODE STATUS: DNR Diet recommendation: Heart Healthy / Carb Modified / Regular / Dysphagia   Brief/Interim Summary: [name] a63 y.o.AA malewith a known history of BPH, CHF, COPD, hypertension, colorectal carcinomaand rheumatoid arthritis, presented to the emergency room with acute onset of nausea without significant vomiting as well as diarrhea and abdominal pain. Symptoms have been going on over the last couple of days at [facility] where he resides. He has been having occasional cough without wheezing or dyspnea chest pain or palpitations. No bilious vomitus or hematemesis. No dysuria, oliguria or hematuria or flank pain.  CT abdomen with increasing mass involving the ascending colon.  Not a surgical candidate.  Palliative care was consulted.  Patient has a legal guardian.  Per discussion with some family member and legal guardian he was transitioned to full comfort measures.  Started on morphine infusion which helped with his pain.  Patient seems comfortable but self talking a lot with intermittent howling to someone who was not there. Comfort measures were continued over the weekend waiting for the bed availability at hospice home.  He was discharge to hospice home on Monday once bed becomes available.  Patient was treated with meropenem for 7 days for intra-abdominal infection.  Urine culture also grew more than 60,000 colonies of E. coli and meropenem should be able to cover it.  Discharge Diagnoses:  Active Problems:   Lower urinary tract infectious disease   Comfort measures only status    Abdominal pain   Nausea vomiting and diarrhea   Leukocytosis   Colorectal cancer [facility])   Discharge Instructions  Discharge Instructions    Diet - low sodium heart healthy   Complete by: As directed    Increase activity slowly   Complete by: As directed      Allergies as of 06/28/2020      Reactions   Methadone    Penicillins    Tolerates cephalosporins   Propoxyphene    Sulfa Antibiotics    Valproic Acid And Related    Thrombocytopenia      Medication List    STOP taking these medications   acetaminophen 325 MG tablet Commonly known as: TYLENOL   albuterol 108 (90 Base) MCG/ACT inhaler Commonly known as: VENTOLIN HFA   Ativan 2 MG tablet Generic drug: LORazepam   benztropine 0.5 MG tablet Commonly known as: COGENTIN   docusate sodium 100 MG capsule Commonly known as: COLACE   feeding supplement (PRO-STAT SUGAR FREE 64) Liqd   ferrous sulfate 325 (65 FE) MG tablet   fluPHENAZine 5 MG tablet Commonly known as: PROLIXIN   furosemide 20 MG tablet Commonly known as: Lasix   guaifenesin 100 MG/5ML syrup Commonly known as: ROBITUSSIN   hydrocortisone 10 MG tablet Commonly known as: CORTEF   hydroxychloroquine 200 MG tablet Commonly known as: PLAQUENIL   levothyroxine 200 MCG tablet Commonly known as: SYNTHROID   methocarbamol 750 MG tablet Commonly known as: ROBAXIN   MIRALAX PO   multivitamin with minerals tablet   pantoprazole 40 MG tablet Commonly known as: PROTONIX   potassium chloride 10 MEQ tablet Commonly known as: KLOR-CON   promethazine 12.5 MG tablet Commonly known as: PHENERGAN  tamsulosin 0.4 MG Caps capsule Commonly known as: FLOMAX   traMADol 50 MG tablet Commonly known as: ULTRAM   vitamin C 250 MG tablet Commonly known as: ASCORBIC ACID     TAKE these medications   ammonium lactate 12 % lotion Commonly known as: LAC-HYDRIN Apply 1 application topically 2 (two) times daily. Apply topically to bilateral feet    hydrALAZINE 50 MG tablet Commonly known as: APRESOLINE Take 50 mg by mouth 4 (four) times daily.   lidocaine-prilocaine cream Commonly known as: EMLA Apply 1 application topically as needed. To port a cath site - 1 hour prior to infusion   metoprolol tartrate 50 MG tablet Commonly known as: LOPRESSOR Take 1 tablet (50 mg total) by mouth 2 (two) times daily. What changed: how much to take   mupirocin ointment 2 % Commonly known as: BACTROBAN Place into the nose 2 (two) times daily.   polyvinyl alcohol 1.4 % ophthalmic solution Commonly known as: LIQUIFILM TEARS Place 1 drop into both eyes 4 (four) times daily as needed for dry eyes.       Allergies  Allergen Reactions  . Methadone   . Penicillins     Tolerates cephalosporins   . Propoxyphene   . Sulfa Antibiotics   . Valproic Acid And Related     Thrombocytopenia    Consultations:  Surgery  Palliative care  Procedures/Studies: CT Abdomen Pelvis Wo Contrast  Result Date: 06/15/2020 CLINICAL DATA:  Ileus. EXAM: CT ABDOMEN AND PELVIS WITHOUT CONTRAST TECHNIQUE: Multidetector CT imaging of the abdomen and pelvis was performed following the standard protocol without IV contrast. COMPARISON:  November 07, 2019 FINDINGS: Lower chest: There are fibrotic changes at the lung bases bilaterally.The heart size is normal. Hepatobiliary: The liver is normal. Cholelithiasis without acute inflammation.There is no biliary ductal dilation. Pancreas: Normal contours without ductal dilatation. No peripancreatic fluid collection. Spleen: Unremarkable. Adrenals/Urinary Tract: --Adrenal glands: Unremarkable. --Right kidney/ureter: No hydronephrosis or radiopaque kidney stones. --Left kidney/ureter: No hydronephrosis or radiopaque kidney stones. --Urinary bladder: Unremarkable. Stomach/Bowel: --Stomach/Duodenum: No hiatal hernia or other gastric abnormality. Normal duodenal course and caliber. --Small bowel: There are few mildly dilated loops of  small bowel in the abdomen without evidence for high-grade obstruction. --Colon: There are few air-fluid levels in the right hemicolon. There is a mass involving the ascending colon measuring approximately 5.5 cm in length (coronal series 5, image 39). There are surrounding inflammatory changes and mildly enlarged regional lymph nodes. There is a questionable pocket extraluminal gas adjacent to this mass (axial series 2, image 49). --Appendix: Not visualized. No right lower quadrant inflammation or free fluid. Vascular/Lymphatic: Atherosclerotic calcification is present within the non-aneurysmal abdominal aorta, without hemodynamically significant stenosis. --No retroperitoneal lymphadenopathy. --No mesenteric lymphadenopathy. --No pelvic or inguinal lymphadenopathy. Reproductive: Unremarkable Other: No ascites or free air. The abdominal wall is normal. Musculoskeletal. There are end-stage degenerative changes both hips. IMPRESSION: 1. No evidence for small bowel obstruction. 2. There is a 5.5 cm mass involving the ascending colon, significantly increased in size from prior study. This mass is favored to represent the patient's reported colorectal carcinoma. There is a questionable focus of extraluminal gas adjacent to this mass which may indicate underlying micro perforation. There are mildly enlarged regional lymph nodes concerning for developing nodal metastatic disease. 3. There is cholelithiasis without secondary signs of acute cholecystitis. 4. Additional chronic findings as detailed above. Aortic Atherosclerosis (ICD10-I70.0). Electronically Signed   By: Constance Holster M.D.   On: 06/15/2020 02:00   DG Chest [facility]  1 View  Result Date: 06/15/2020 CLINICAL DATA:  Chronic shortness of breath EXAM: PORTABLE CHEST 1 VIEW COMPARISON:  December 11, 2019 FINDINGS: The heart size is stable but enlarged. There is a stable right-sided Port-A-Cath. There are end-stage degenerative changes of both glenohumeral  joints. Chronic interstitial lung markings are again noted bilaterally. The there is no pneumothorax. There is no definite focal infiltrate. IMPRESSION: 1. No acute cardiopulmonary process. 2. Again noted are findings of pulmonary fibrosis without evidence for a focal infiltrate. 3. Stable positioning of the right-sided Port-A-Cath. 4. Cardiomegaly. Electronically Signed   By: Constance Holster M.D.   On: 06/15/2020 01:14     Subjective: Patient appears comfortable but self talking.  When I asked he denied any pain.  Discharge Exam: Vitals:   06/28/20 0500 06/28/20 0743  BP: (!) 151/110 (!) 170/105  Pulse: 105 89  Resp: 18 16  Temp: 98.2 F (36.8 C) 98.3 F (36.8 C)  SpO2: 100% 100%   Vitals:   06/27/20 0356 06/27/20 0732 06/28/20 0500 06/28/20 0743  BP: (!) 149/109 (!) 158/111 (!) 151/110 (!) 170/105  Pulse: 102 81 105 89  Resp: 18 17 18 16   Temp: (!) 97.5 F (36.4 C) 98 F (36.7 C) 98.2 F (36.8 C) 98.3 F (36.8 C)  TempSrc: Oral Oral Oral Oral  SpO2: 100% 100% 100% 100%  Weight:      Height:        General: Pt is alert, awake, not in acute distress Cardiovascular: RRR, S1/S2 +, no rubs, no gallops Respiratory: CTA bilaterally, no wheezing, no rhonchi Abdominal: Soft, NT, ND, bowel sounds + Extremities: 2+ LE edema, no cyanosis   The results of significant diagnostics from this hospitalization (including imaging, microbiology, ancillary and laboratory) are listed below for reference.    Microbiology: Recent Results (from the past 240 hour(s))  MRSA PCR Screening     Status: Abnormal   Collection Time: 06/21/20 10:46 AM   Specimen: Nasopharyngeal  Result Value Ref Range Status   MRSA by PCR POSITIVE (A) NEGATIVE Final    Comment:        The GeneXpert MRSA Assay (FDA approved for NASAL specimens only), is one component of a comprehensive MRSA colonization surveillance program. It is not intended to diagnose MRSA infection nor to guide or monitor treatment  for MRSA infections. RESULT CALLED TO, READ BACK BY AND VERIFIED WITH: Di Kindle [facility] 06/21/20 AT 1813 BY ACR Performed at [facility], [address] [place], [place] [address]      Labs: BNP (last 3 results) Recent Labs    09/12/19 0000 09/13/19 0141 09/14/19 0123  BNP 650.2* 502.3* 671.2*   Basic Metabolic Panel: Recent Labs  Lab 06/22/20 0412 06/23/20 0630 06/24/20 0423  NA 138 137 137  K 4.0 4.1 4.0  CL 103 101 101  CO2 29 29 28   GLUCOSE 88 80 96  BUN <5* <5* 8  CREATININE 0.34* 0.41* 0.42*  CALCIUM 8.1* 8.0* 7.8*  MG 1.8 1.8 1.8   Liver Function Tests: No results for input(s): AST, ALT, ALKPHOS, BILITOT, PROT, ALBUMIN in the last 168 hours. No results for input(s): LIPASE, AMYLASE in the last 168 hours. No results for input(s): AMMONIA in the last 168 hours. CBC: Recent Labs  Lab 06/22/20 0412 06/23/20 0630 06/24/20 0423  WBC 6.7 7.6 8.6  HGB 9.9* 9.3* 9.0*  HCT 31.8* 29.1* 27.6*  MCV 82.6 82.2 80.7  PLT 242 274 308   Cardiac Enzymes: No results for input(s):  CKTOTAL, CKMB, CKMBINDEX, TROPONINI in the last 168 hours. BNP: Invalid input(s): POCBNP CBG: Recent Labs  Lab 06/23/20 0508 06/23/20 1117 06/23/20 1853 06/23/20 2334 06/24/20 0533  GLUCAP 72 87 93 74 76   D-Dimer No results for input(s): DDIMER in the last 72 hours. Hgb A1c No results for input(s): HGBA1C in the last 72 hours. Lipid Profile No results for input(s): CHOL, HDL, LDLCALC, TRIG, CHOLHDL, LDLDIRECT in the last 72 hours. Thyroid function studies No results for input(s): TSH, T4TOTAL, T3FREE, THYROIDAB in the last 72 hours.  Invalid input(s): FREET3 Anemia work up No results for input(s): VITAMINB12, FOLATE, FERRITIN, TIBC, IRON, RETICCTPCT in the last 72 hours. Urinalysis    Component Value Date/Time   COLORURINE AMBER (A) 06/15/2020 0048   APPEARANCEUR CLOUDY (A) 06/15/2020 0048   APPEARANCEUR Hazy 01/19/2013 1635   LABSPEC 1.020 06/15/2020 0048    LABSPEC 1.008 01/19/2013 1635   PHURINE 5.0 06/15/2020 0048   GLUCOSEU NEGATIVE 06/15/2020 0048   GLUCOSEU Negative 01/19/2013 1635   HGBUR NEGATIVE 06/15/2020 0048   BILIRUBINUR SMALL (A) 06/15/2020 0048   BILIRUBINUR Negative 01/19/2013 1635   KETONESUR NEGATIVE 06/15/2020 0048   PROTEINUR 30 (A) 06/15/2020 0048   NITRITE NEGATIVE 06/15/2020 0048   LEUKOCYTESUR SMALL (A) 06/15/2020 0048   LEUKOCYTESUR Negative 01/19/2013 1635   Sepsis Labs Invalid input(s): PROCALCITONIN,  WBC,  LACTICIDVEN Microbiology Recent Results (from the past 240 hour(s))  MRSA PCR Screening     Status: Abnormal   Collection Time: 06/21/20 10:46 AM   Specimen: Nasopharyngeal  Result Value Ref Range Status   MRSA by PCR POSITIVE (A) NEGATIVE Final    Comment:        The GeneXpert MRSA Assay (FDA approved for NASAL specimens only), is one component of a comprehensive MRSA colonization surveillance program. It is not intended to diagnose MRSA infection nor to guide or monitor treatment for MRSA infections. RESULT CALLED TO, READ BACK BY AND VERIFIED WITH: Di Kindle [facility] 06/21/20 AT 1813 BY ACR Performed at [facility] [address] [place],  [address]     Time coordinating discharge: Over 30 minutes  SIGNED:  Lorella Nimrod, MD  [facility] 06/28/2020, 1:50 PM  If 7PM-7AM, please contact night-coverage [email or web address]  This record has been created using Systems analyst. Errors have been sought and corrected,but may not always be located. Such creation errors do not reflect on the standard of care.

## 2020-06-28 NOTE — TOC Transition Note (Signed)
Transition of Care [facility]) - CM/SW Discharge Note   Patient Details  Name: Jerry [name] MRN: [id number] Date of Birth: 1956/04/27  [facility] CM/SW Contact:  Shelbie Hutching, RN Phone Number: 06/28/2020, 2:09 PM   Clinical Narrative:    Patient has a bed at residential hospice facility [facility]  Patient's family is aware of discharge to the hospice facility this evening.  [facility] will provide transportation.    Final next level of care: [place] Barriers to Discharge: Barriers Resolved   Patient Goals and CMS Choice Patient states their goals for this hospitalization and ongoing recovery are:: Niece has decided that she would like to make the patient comfort care CMS Medicare.gov Compare Post Acute Care list provided to:: Patient Represenative (must comment) ([name]- niece) Choice offered to / list presented to : [facility] POA / [place]  Discharge Placement              Patient chooses bed at:  [facility]) Patient to be transferred to facility by: [facility] Name of family member notified: [name] Patient and family notified of of transfer: 06/28/20  Discharge Plan and Services   Discharge Planning Services: CM Consult                                 Social Determinants of Health (SDOH) Interventions     Readmission Risk Interventions Readmission Risk Prevention Plan 06/17/2020 11/20/2019 11/03/2019  Transportation Screening Complete Complete Complete  HRI or Home Care Consult - - Not Complete  HRI or Home Care Consult comments - - Pt lives at a [facility] for [place] Planning/Counseling - - [place] - - Not Applicable  Medication Review Press photographer) Complete Complete Complete  PCP or Specialist appointment within 3-5 days of discharge Complete (No Data) -  [place] or Home Care Consult Complete - -  SW Recovery Care/Counseling Consult Complete - -   Palliative Care Screening Complete Complete -  Skilled Nursing Facility Complete Complete -  Some recent data might be hidden

## 2020-07-26 MED FILL — Ondansetron HCl Inj 4 MG/2ML (2 MG/ML): INTRAMUSCULAR | Qty: 2 | Status: AC

## 2020-12-20 IMAGING — CT CT ABD-PELV W/O CM
2 of 4 series · 16 of 46 positions shown, 18 images · non-contrast
Comparison: November 07, 2019

CLINICAL DATA: Ile[place].

EXAM:
CT ABDOMEN AND PELVIS WITHOUT CONTRAST
TECHNIQUE: Multidetector CT imaging of the abdomen and pelvis was performed
following the standard protocol without IV contrast.

[Series 2: axial st · axial · 1.27mm/px · z∈[-1316,-841]mm · 13 of 107 slices shown, 15 images]
[im 6/107  soft-tissue]
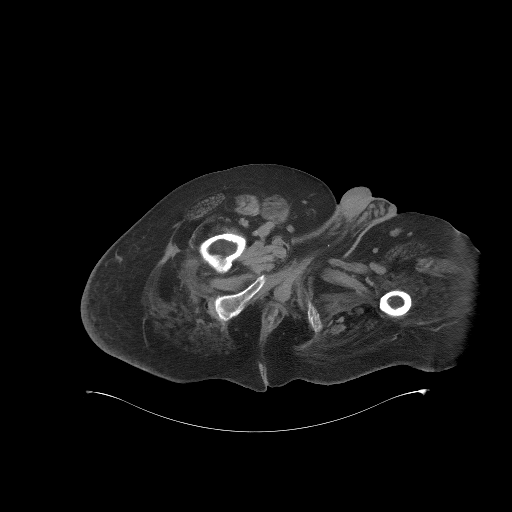
[im 6/107  bone]
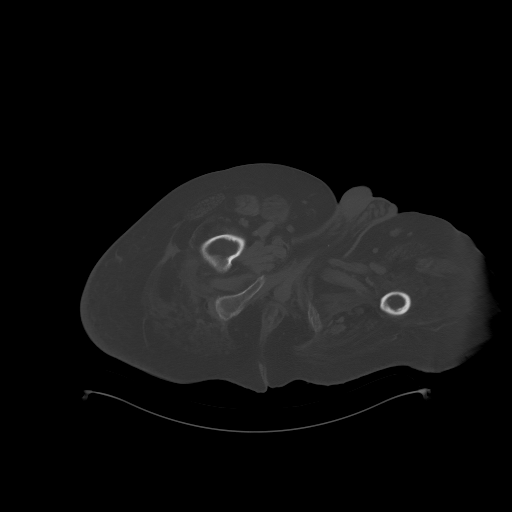
[im 16/107  soft-tissue]
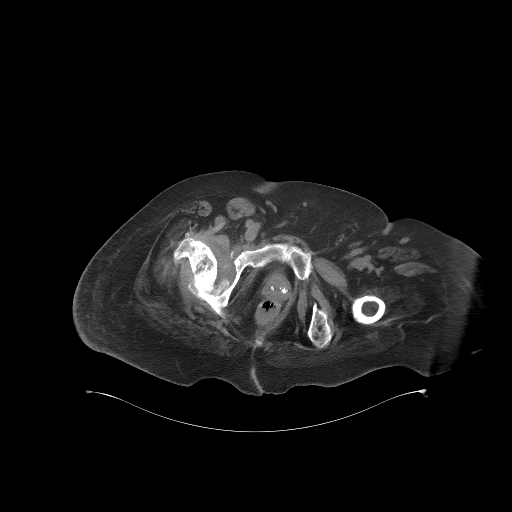
[im 21/107  soft-tissue]
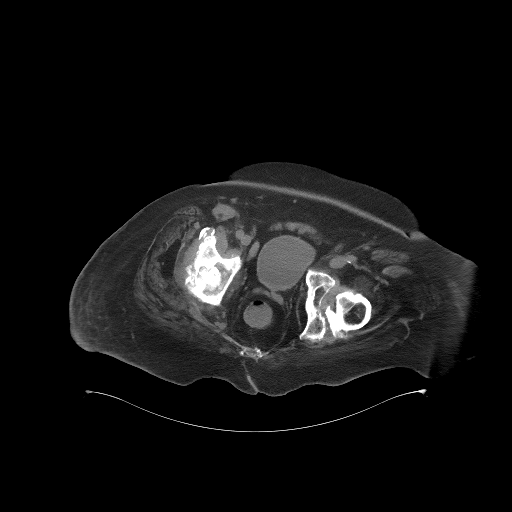
[im 31/107  soft-tissue]
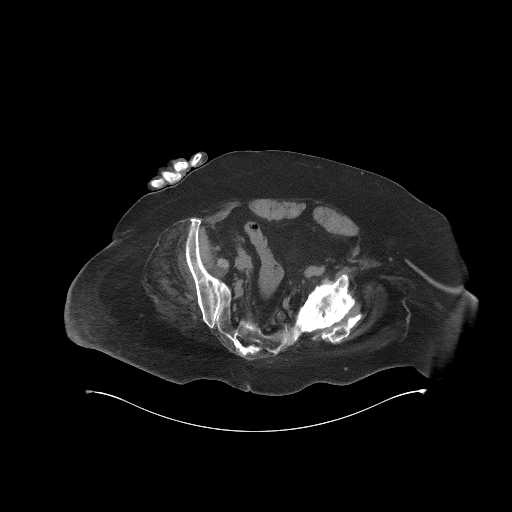
[im 36/107  soft-tissue]
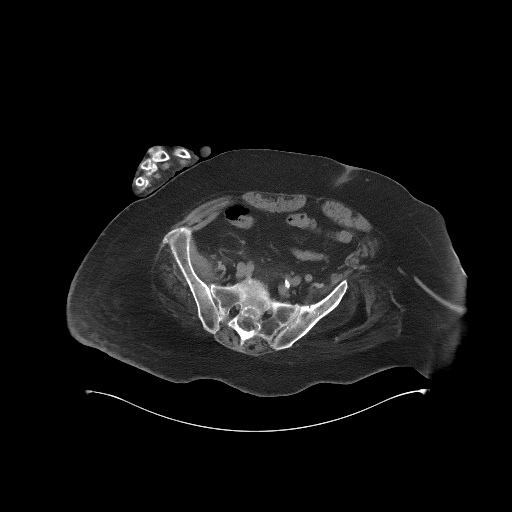
[im 46/107  soft-tissue]
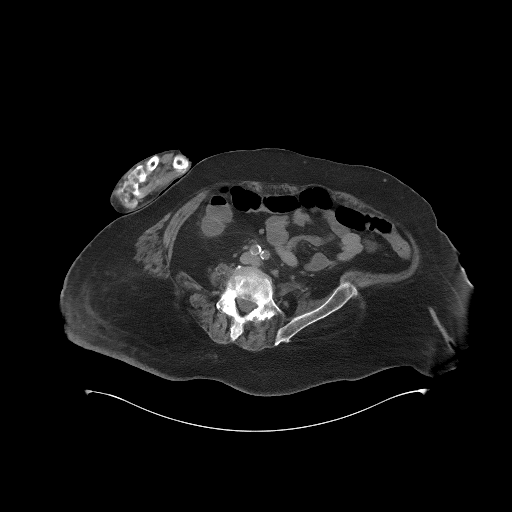
[im 56/107  soft-tissue]
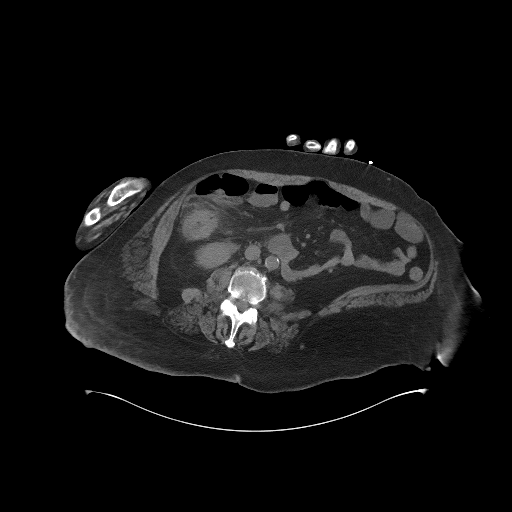
[im 61/107  soft-tissue]
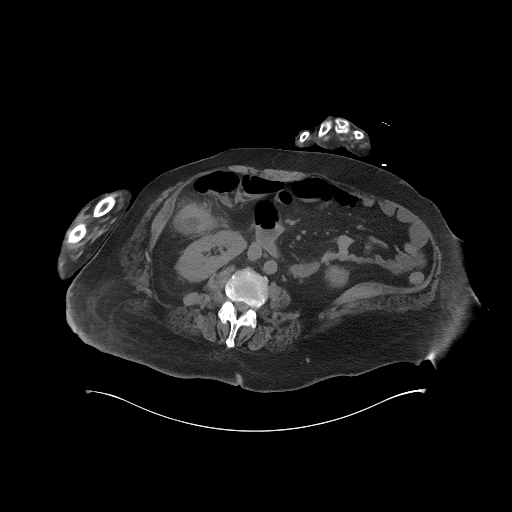
[im 71/107  soft-tissue]
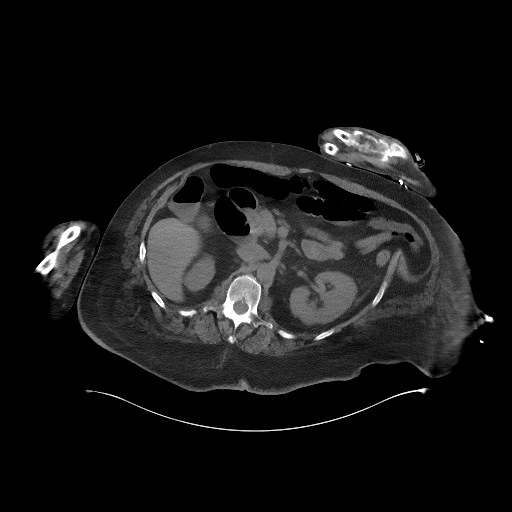
[im 71/107  bone]
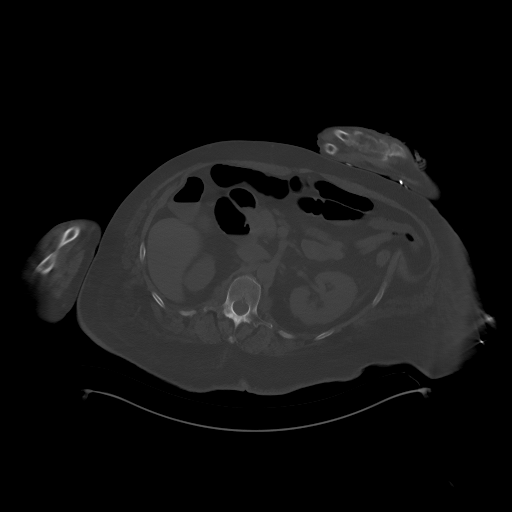
[im 76/107  soft-tissue]
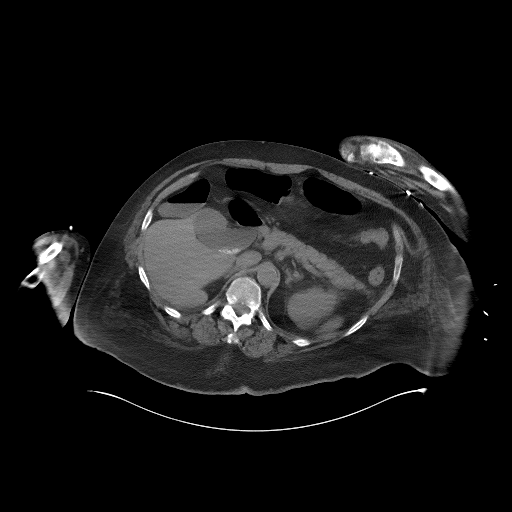
[im 86/107  soft-tissue]
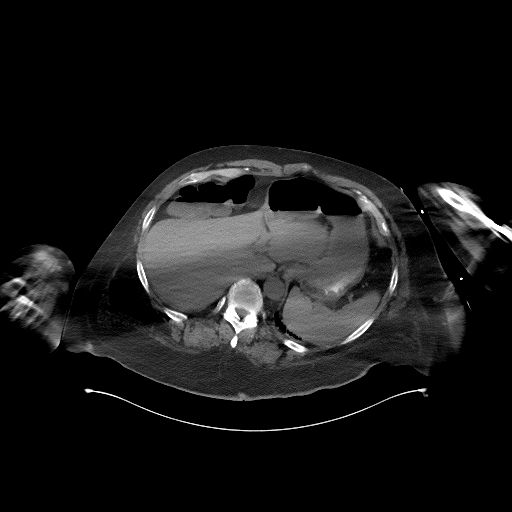
[im 91/107  soft-tissue]
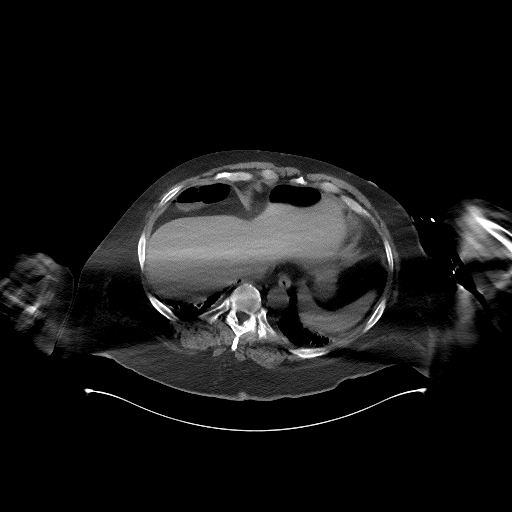
[im 101/107  soft-tissue]
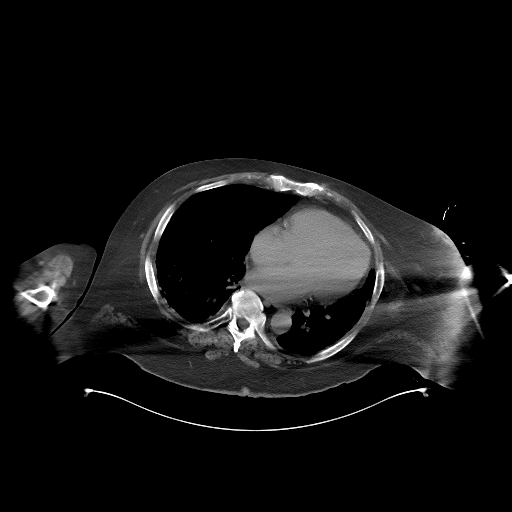

[Series 5: coronal st · coronal · 1.06mm/px · 3 of 102 slices shown]
[im 34/102  soft-tissue]
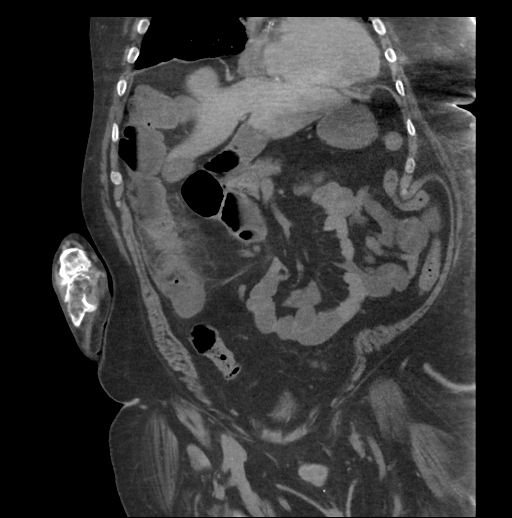
[im 45/102  soft-tissue]
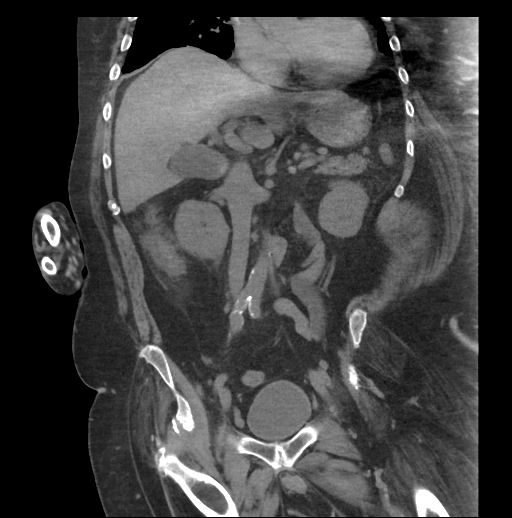
[im 57/102  soft-tissue]
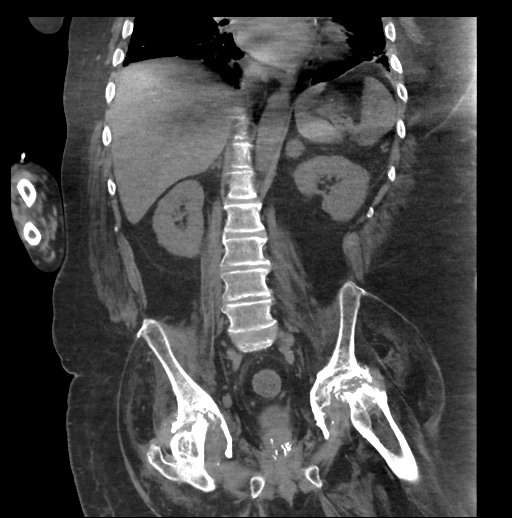

[16 of 46 positions shown; findings below may reference images not displayed]

FINDINGS: Lower chest: There are fibrotic changes at the lung bases
bilaterally.The heart size is normal.

Hepatobiliary: The liver is normal. Cholelithiasis without acute
inflammation.There is no biliary ductal dilation.

Pancreas: Normal contours without ductal dilatation. No
peripancreatic fluid collection.

Spleen: Unremarkable.

Adrenals/Urinary Tract:

--Adrenal glands: Unremarkable.

--Right kidney/ureter: No hydronephrosis or radiopaque kidney
stones.

--Left kidney/ureter: No hydronephrosis or radiopaque kidney stones.

--Urinary bladder: Unremarkable.

Stomach/Bowel:

--Stomach/Duodenum: No hiatal hernia or other gastric abnormality.
Normal duodenal course and caliber.

--Small bowel: There are few mildly dilated loops of small bowel in
the abdomen without evidence for high-grade obstruction.

--Colon: There are few air-fluid levels in the right hemicolon.
There is a mass involving the ascending colon measuring
approximately 5.5 cm in length (coronal series 5, image 39). There
are surrounding inflammatory changes and mildly enlarged regional
lymph nodes. There is a questionable pocket extraluminal gas
adjacent to this mass (axial series 2, image 49).

--Appendix: Not visualized. No right lower quadrant inflammation or
free fluid.

Vascular/Lymphatic: Atherosclerotic calcification is present within
the non-aneurysmal abdominal aorta, without hemodynamically
significant stenosis.

--No retroperitoneal lymphadenopathy.

--No mesenteric lymphadenopathy.

--No pelvic or inguinal lymphadenopathy.

Reproductive: Unremarkable

Other: No ascites or free air. The abdominal wall is normal.

Musculoskeletal. There are end-stage degenerative changes both hips.
IMPRESSION: 1. No evidence for small bowel obstruction.
2. There is a 5.5 cm mass involving the ascending colon,
significantly increased in size from prior study. This mass is
favored to represent the patient's reported colorectal carcinoma.
There is a questionable focus of extraluminal gas adjacent to this
mass which may indicate underlying micro perforation. There are
mildly enlarged regional lymph nodes concerning for developing nodal
metastatic disease.
3. There is cholelithiasis without secondary signs of acute
cholecystitis.
4. Additional chronic findings as detailed above.

Aortic Atherosclerosis ([id number]).
